# Patient Record
Sex: Female | Born: 1935 | Race: White | Hispanic: No | Smoking: Never smoker
Health system: Southern US, Community
[De-identification: ages and names within clinical notes are randomized; demographics above are authoritative.]

## PROBLEM LIST (undated history)

## (undated) DIAGNOSIS — M978XXA Periprosthetic fracture around other internal prosthetic joint, initial encounter: Secondary | ICD-10-CM

## (undated) DIAGNOSIS — J45909 Unspecified asthma, uncomplicated: Secondary | ICD-10-CM

## (undated) DIAGNOSIS — E785 Hyperlipidemia, unspecified: Secondary | ICD-10-CM

## (undated) DIAGNOSIS — K529 Noninfective gastroenteritis and colitis, unspecified: Secondary | ICD-10-CM

## (undated) DIAGNOSIS — R7303 Prediabetes: Secondary | ICD-10-CM

## (undated) DIAGNOSIS — E119 Type 2 diabetes mellitus without complications: Secondary | ICD-10-CM

## (undated) DIAGNOSIS — R112 Nausea with vomiting, unspecified: Secondary | ICD-10-CM

## (undated) DIAGNOSIS — I214 Non-ST elevation (NSTEMI) myocardial infarction: Secondary | ICD-10-CM

## (undated) DIAGNOSIS — I739 Peripheral vascular disease, unspecified: Secondary | ICD-10-CM

## (undated) DIAGNOSIS — A048 Other specified bacterial intestinal infections: Secondary | ICD-10-CM

## (undated) DIAGNOSIS — I10 Essential (primary) hypertension: Secondary | ICD-10-CM

## (undated) DIAGNOSIS — A0811 Acute gastroenteropathy due to Norwalk agent: Secondary | ICD-10-CM

## (undated) DIAGNOSIS — Z9889 Other specified postprocedural states: Secondary | ICD-10-CM

## (undated) DIAGNOSIS — F329 Major depressive disorder, single episode, unspecified: Secondary | ICD-10-CM

## (undated) DIAGNOSIS — K219 Gastro-esophageal reflux disease without esophagitis: Secondary | ICD-10-CM

## (undated) DIAGNOSIS — Z8489 Family history of other specified conditions: Secondary | ICD-10-CM

## (undated) DIAGNOSIS — T4145XA Adverse effect of unspecified anesthetic, initial encounter: Secondary | ICD-10-CM

## (undated) DIAGNOSIS — I350 Nonrheumatic aortic (valve) stenosis: Secondary | ICD-10-CM

## (undated) DIAGNOSIS — Z96649 Presence of unspecified artificial hip joint: Secondary | ICD-10-CM

## (undated) DIAGNOSIS — T8859XA Other complications of anesthesia, initial encounter: Secondary | ICD-10-CM

## (undated) DIAGNOSIS — I219 Acute myocardial infarction, unspecified: Secondary | ICD-10-CM

## (undated) DIAGNOSIS — H9192 Unspecified hearing loss, left ear: Secondary | ICD-10-CM

## (undated) DIAGNOSIS — I2510 Atherosclerotic heart disease of native coronary artery without angina pectoris: Secondary | ICD-10-CM

## (undated) DIAGNOSIS — F32A Depression, unspecified: Secondary | ICD-10-CM

## (undated) HISTORY — PX: COLONOSCOPY: SHX174

## (undated) HISTORY — PX: OTHER SURGICAL HISTORY: SHX169

## (undated) HISTORY — PX: ABDOMINAL HYSTERECTOMY: SHX81

## (undated) HISTORY — PX: ESOPHAGOGASTRODUODENOSCOPY: SHX1529

## (undated) HISTORY — DX: Nonrheumatic aortic (valve) stenosis: I35.0

## (undated) HISTORY — DX: Acute myocardial infarction, unspecified: I21.9

## (undated) HISTORY — DX: Hyperlipidemia, unspecified: E78.5

## (undated) HISTORY — DX: Noninfective gastroenteritis and colitis, unspecified: K52.9

## (undated) HISTORY — DX: Essential (primary) hypertension: I10

## (undated) HISTORY — DX: Gastro-esophageal reflux disease without esophagitis: K21.9

## (undated) HISTORY — DX: Unspecified asthma, uncomplicated: J45.909

## (undated) HISTORY — DX: Other specified bacterial intestinal infections: A04.8

## (undated) HISTORY — DX: Periprosthetic fracture around other internal prosthetic joint, initial encounter: M97.8XXA

## (undated) HISTORY — DX: Unspecified hearing loss, left ear: H91.92

## (undated) HISTORY — PX: CARDIAC CATHETERIZATION: SHX172

## (undated) HISTORY — PX: CHOLECYSTECTOMY: SHX55

## (undated) HISTORY — DX: Periprosthetic fracture around other internal prosthetic joint, initial encounter: Z96.649

## (undated) HISTORY — DX: Acute gastroenteropathy due to Norwalk agent: A08.11

## (undated) HISTORY — PX: TOTAL HIP ARTHROPLASTY: SHX124

## (undated) HISTORY — PX: BLADDER SURGERY: SHX569

## (undated) HISTORY — DX: Type 2 diabetes mellitus without complications: E11.9

---
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
## 2003-03-09 ENCOUNTER — Other Ambulatory Visit: Payer: Self-pay

## 2003-07-22 ENCOUNTER — Other Ambulatory Visit: Payer: Self-pay

## 2004-09-20 ENCOUNTER — Ambulatory Visit: Payer: Self-pay | Admitting: Specialist

## 2004-10-19 ENCOUNTER — Ambulatory Visit: Payer: Self-pay | Admitting: Specialist

## 2004-10-24 ENCOUNTER — Ambulatory Visit: Payer: Self-pay | Admitting: Specialist

## 2004-11-02 ENCOUNTER — Ambulatory Visit: Payer: Self-pay | Admitting: Specialist

## 2004-11-29 ENCOUNTER — Ambulatory Visit: Payer: Self-pay | Admitting: Specialist

## 2004-12-15 ENCOUNTER — Ambulatory Visit: Payer: Self-pay | Admitting: Gastroenterology

## 2004-12-30 ENCOUNTER — Ambulatory Visit: Payer: Self-pay | Admitting: Specialist

## 2005-01-29 ENCOUNTER — Ambulatory Visit: Payer: Self-pay | Admitting: Specialist

## 2005-02-14 ENCOUNTER — Ambulatory Visit: Payer: Self-pay | Admitting: Gastroenterology

## 2005-11-23 ENCOUNTER — Ambulatory Visit: Payer: Self-pay | Admitting: Specialist

## 2006-06-05 ENCOUNTER — Ambulatory Visit: Payer: Self-pay | Admitting: Unknown Physician Specialty

## 2006-08-13 ENCOUNTER — Ambulatory Visit: Payer: Self-pay | Admitting: Cardiology

## 2006-08-21 ENCOUNTER — Ambulatory Visit: Payer: Self-pay | Admitting: Cardiology

## 2006-10-29 ENCOUNTER — Ambulatory Visit: Payer: Self-pay | Admitting: Specialist

## 2006-11-27 ENCOUNTER — Ambulatory Visit: Payer: Self-pay | Admitting: Specialist

## 2006-12-13 ENCOUNTER — Ambulatory Visit: Payer: Self-pay | Admitting: Rheumatology

## 2007-03-01 ENCOUNTER — Ambulatory Visit: Payer: Self-pay | Admitting: Specialist

## 2007-05-17 ENCOUNTER — Ambulatory Visit: Payer: Self-pay | Admitting: Gastroenterology

## 2007-11-28 ENCOUNTER — Ambulatory Visit: Payer: Self-pay | Admitting: Specialist

## 2008-05-04 ENCOUNTER — Ambulatory Visit: Payer: Self-pay | Admitting: Specialist

## 2008-05-19 ENCOUNTER — Ambulatory Visit: Payer: Self-pay | Admitting: Cardiology

## 2008-05-26 ENCOUNTER — Ambulatory Visit: Payer: Self-pay

## 2008-05-26 ENCOUNTER — Ambulatory Visit: Payer: Self-pay | Admitting: Cardiology

## 2008-05-26 LAB — CONVERTED CEMR LAB
Albumin: 3.3 g/dL — ABNORMAL LOW (ref 3.5–5.2)
Alkaline Phosphatase: 51 units/L (ref 39–117)
BUN: 16 mg/dL (ref 6–23)
Calcium: 9.6 mg/dL (ref 8.4–10.5)
GFR calc Af Amer: 88 mL/min
Glucose, Bld: 156 mg/dL — ABNORMAL HIGH (ref 70–99)
HDL: 57 mg/dL (ref >39.0)
Potassium: 3.9 meq/L (ref 3.5–5.1)
Total Protein: 6.3 g/dL (ref 6.0–8.3)
Triglycerides: 217 mg/dL (ref 0–149)

## 2008-06-16 ENCOUNTER — Ambulatory Visit: Payer: Self-pay

## 2008-11-11 ENCOUNTER — Ambulatory Visit: Payer: Self-pay | Admitting: Family

## 2008-12-01 ENCOUNTER — Ambulatory Visit: Payer: Self-pay | Admitting: Specialist

## 2009-12-29 ENCOUNTER — Ambulatory Visit: Payer: Self-pay | Admitting: Internal Medicine

## 2010-06-09 ENCOUNTER — Ambulatory Visit: Payer: Self-pay | Admitting: Internal Medicine

## 2010-09-13 NOTE — Assessment & Plan Note (Signed)
[facility] OFFICE NOTE   Maureen, Morales                        MRN:          [id number]  DATE:05/19/2008                            DOB:          1922-06-30    HISTORY OF PRESENT ILLNESS:  Maureen Morales comes in today referred by Dr.  Orson Aloe for chest discomfort.   Maureen Morales is an 77 year old lady that I have seen in the past, last  being April 2008.  At that time, she had noncardiac chest pain,  asymptomatic carotid bruits with nonobstructive plaque by carotid  Dopplers and hypertension, which was under good control with Dr.  Sharrell Ku care.  She has hyperlipidemia, has been intolerant of  medications and only takes fish oil and red yeast rice.   Her risk factors for coronary artery disease are outlined in the chart  include age, carotid disease or vascular disease, hypertension, and  hyperlipidemia.   She recently had pneumonia over the holidays.  She has just finished a  course of antibiotics as well as a prednisone taper, which she finished  last week.  She has had some chest discomfort described as some  tightness on occasion, but also sharp at times.  It is not exacerbated  by taking a deep breath.  She denies any difficulty swallowing even  elastic foods such as chicken or bread.  She has had no reflux symptoms  per se.  On occasion, she will take Tums.  She denies any nausea,  vomiting, or change in bowel habits other than some constipation.  She  is actually had a recent colonoscopy within the past year and also  sigmoid recently, which showed just some narrowing.  She was placed on  stool softeners.  She also has hemorrhoids.   Her last objective assessment of potential coronary artery disease was  in April 2005.  She had EF 85% with good perfusion.  Her last carotid  Doppler on August 21, 2006, which showed nonobstructive plaque with  antegrade flow in both vertebrals.   CURRENT  MEDICATIONS:  1. Toprol-XL 25 mg per day.  2. Premarin 0.3 mg p.o. daily.  3. Calcium and vitamin D daily.  4. Multivitamin daily.  5. Biotin one daily.  6. Metformin 500 mg per day.  7. Diovan HCT 320/25 daily.  8. Fish oil 1200 mg 3 per day.  9. Stool softener daily.  10.Tums and Nexium p.r.n..   PHYSICAL EXAMINATION:  VITAL SIGNS:  Her blood pressure today is 142/52,  which is pretty good for her.  Her pulse is 74 and regular.  Her weight  is 130.  HEENT:  Normal.  NECK:  Supple.  Carotid upstrokes were equal bilaterally with soft  bilateral bruits versus referred sounds.  Thyroid is not enlarged.  Trachea is midline.  There is no lymphadenopathy.  LUNGS:  Clear to auscultation and percussion.  CHEST:  No chest wall tenderness to palpation.  She has a nondisplaced  PMI.  Normal S1 and S2.  Soft systolic murmur with her S2  splitting  with inspiration.  ABDOMEN:  Soft, good bowel sounds.  There is no midline bruit or  pulsatile mass.  There is no hepatomegaly.  There is no real tenderness.  EXTREMITIES:  No cyanosis, clubbing, or edema.  Pulses were brisk  bilaterally.  There is no sign of DVT.  NEUROLOGIC:  Grossly intact.  SKIN:  Unremarkable.   Her EKG is normal except for some mild low voltage.   ASSESSMENT:  1. Chest discomfort, rule out any obstructive coronary artery disease.      It all could be related to the residue of pneumonia having taken      antibiotics and prednisone.  It really does not sound like reflux      and it certainly is not reflective of musculoskeletal by exam.  2. Asymptomatic carotid bruits with a history of nonobstructive      carotid plaques.  3. Hypertension, under good control.  4. Hyperlipidemia on omega-3 and red yeast rice, intolerant of other      medications.  5. Recent hemorrhoidal bleed.   PLAN:  1. Continue current medications.  I will not add aspirin at this time.  2. Adenosine rest stress Myoview for obstructive coronary  artery      disease.  3. Carotid Dopplers.  4. Fasting comprehensive metabolic panel, lipid panel per the      patient's request.  I will forward all these results to Dr.      Leavy Cella.     Thomas C. Daleen Squibb, MD, [facility]  Electronically Signed    TCW/MedQ  DD: 05/19/2008  DT: 05/19/2008  Job #: [id number]   cc:   Orson Aloe, MD

## 2010-09-16 NOTE — Assessment & Plan Note (Signed)
[facility] OFFICE NOTE   Maureen Morales, Maureen Morales                        MRN:          [id number]  DATE:08/13/2006                            DOB:          02-28-1923    Maureen Morales comes back today for a follow-up of some chest discomfort.  It  is described as well-localized just above her right breast.  It seems to  be a sharp pain when it occurs.  It sometimes occurs after she has eaten  too much but it seems most of the time it really is not related to much  of anything.   She has been followed by Dr. Leavy Cella by her blood pressure.  Because of  her multiple drug intolerances, she is on omega-3 1000 mg t.i.d. and red  yeast rice.   Her hypertensive medicines are Toprol XL 25 mg a day, Diovan 320 mg a  day.   She had carotid bruits on my last evaluation of her in 2005.  At that  time we obtained carotid Dopplers, which showed nonobstructive plaque  and antegrade flow in both vertebrals.   She is treated intermittently for reflux.   She has no dyspnea on exertion, no angina, no ischemic symptoms  otherwise.  She is extremely active.   PHYSICAL EXAMINATION:  VITAL SIGNS:  Her blood pressure is 138/82, her  pulse was 90 and regular.  GENERAL:  She is having a lot of allergies right now and does not feel  well.  Her EKG is normal.  HEENT:  Normocephalic, atraumatic.  PERRLA, extraocular movements  intact.  Sclerae are clear.  She wears glasses.  Facial symmetry is  normal.  NECK:  Carotids are full with soft systolic sounds bilaterally, right  greater than left.  Thyroid is not enlarged.  Trachea is midline.  LUNGS:  No wheezing or rhonchi.  HEART: A nondisplaced PMI.  She has a soft systolic murmur along the  left sternal border.  S2 splits normally.  ABDOMEN:  Soft with good bowel sounds.  EXTREMITIES:  No cyanosis, clubbing or edema.  She has some varicose  veins.   ASSESSMENT AND PLAN:  1. Noncardiac  chest pain.  She is tender to palpation in that area,      which supports it maybe being musculoskeletal.  She has heavy      breasts, as she pointed out, and this could be causing some strain.      Bra support will help.  2. Asymptomatic carotid bruits.  She has nonobstructive plaque.  3. Hypertension under great control by Dr. Leavy Cella.   PLAN:  1. Continue current medications.  2. Carotid Dopplers at her convenience.  3. See me back in a year.   We did review symptoms of angina and ischemia for her to watch out down  the road.     Thomas C. Daleen Squibb, MD, [facility]  Electronically Signed    TCW/MedQ  DD: 08/13/2006  DT: 08/13/2006  Job #: [id number]   cc:   Dr. Leavy Cella

## 2010-09-23 ENCOUNTER — Encounter: Payer: Self-pay | Admitting: Podiatry

## 2010-11-22 ENCOUNTER — Ambulatory Visit: Payer: Self-pay | Admitting: Internal Medicine

## 2011-01-09 ENCOUNTER — Other Ambulatory Visit (INDEPENDENT_AMBULATORY_CARE_PROVIDER_SITE_OTHER): Payer: Medicare Other | Admitting: *Deleted

## 2011-01-09 ENCOUNTER — Other Ambulatory Visit: Payer: Self-pay | Admitting: *Deleted

## 2011-01-09 DIAGNOSIS — E119 Type 2 diabetes mellitus without complications: Secondary | ICD-10-CM

## 2011-01-09 DIAGNOSIS — Z0000 Encounter for general adult medical examination without abnormal findings: Secondary | ICD-10-CM

## 2011-01-09 MED ORDER — FENOFIBRATE 160 MG PO TABS: 160.0000 mg | ORAL_TABLET | Freq: Every day | ORAL | Status: DC

## 2011-01-09 NOTE — Telephone Encounter (Signed)
Received faxed refill request from pharmacy. Is it okay to refill Fenofibrate?

## 2011-01-10 LAB — HEPATIC FUNCTION PANEL
ALT: 30 U/L (ref 0–35)
AST: 34 U/L (ref 0–37)
Alkaline Phosphatase: 37 U/L — ABNORMAL LOW (ref 39–117)
Bilirubin, Direct: 0.1 mg/dL (ref 0.0–0.3)
Total Protein: 6.9 g/dL (ref 6.0–8.3)

## 2011-01-10 LAB — BASIC METABOLIC PANEL
CO2: 28 mEq/L (ref 19–32)
Chloride: 106 mEq/L (ref 96–112)
Creatinine, Ser: 1.2 mg/dL (ref 0.4–1.2)
Potassium: 4.7 mEq/L (ref 3.5–5.1)

## 2011-01-10 LAB — LIPID PANEL
Total CHOL/HDL Ratio: 2
Triglycerides: 64 mg/dL (ref 0.0–149.0)

## 2011-01-16 ENCOUNTER — Encounter: Payer: Self-pay | Admitting: Internal Medicine

## 2011-01-16 ENCOUNTER — Ambulatory Visit (INDEPENDENT_AMBULATORY_CARE_PROVIDER_SITE_OTHER): Payer: Medicare Other | Admitting: Internal Medicine

## 2011-01-16 VITALS — BP 163/77 | HR 73 | Temp 97.6°F | Resp 16 | Ht 63.0 in | Wt 128.0 lb

## 2011-01-16 DIAGNOSIS — L300 Nummular dermatitis: Secondary | ICD-10-CM | POA: Insufficient documentation

## 2011-01-16 DIAGNOSIS — I10 Essential (primary) hypertension: Secondary | ICD-10-CM

## 2011-01-16 DIAGNOSIS — E119 Type 2 diabetes mellitus without complications: Secondary | ICD-10-CM

## 2011-01-16 DIAGNOSIS — E1169 Type 2 diabetes mellitus with other specified complication: Secondary | ICD-10-CM | POA: Insufficient documentation

## 2011-01-16 DIAGNOSIS — E785 Hyperlipidemia, unspecified: Secondary | ICD-10-CM

## 2011-01-16 DIAGNOSIS — R21 Rash and other nonspecific skin eruption: Secondary | ICD-10-CM

## 2011-01-16 DIAGNOSIS — E1121 Type 2 diabetes mellitus with diabetic nephropathy: Secondary | ICD-10-CM | POA: Insufficient documentation

## 2011-01-16 HISTORY — DX: Nummular dermatitis: L30.0

## 2011-01-16 MED ORDER — NYSTATIN-TRIAMCINOLONE 100000-0.1 UNIT/GM-% EX OINT: TOPICAL_OINTMENT | Freq: Two times a day (BID) | CUTANEOUS | Status: AC

## 2011-01-16 MED ORDER — ESTROGENS CONJUGATED 0.3 MG PO TABS: 0.3000 mg | ORAL_TABLET | Freq: Every day | ORAL | Status: DC

## 2011-01-16 MED ORDER — METOPROLOL SUCCINATE ER 25 MG PO TB24: 25.0000 mg | ORAL_TABLET | Freq: Every day | ORAL | Status: DC

## 2011-01-16 NOTE — Assessment & Plan Note (Signed)
Elevated today.  Will have her recheck it at home and treat if elevated.

## 2011-01-16 NOTE — Patient Instructions (Signed)
Your cholesterol and diabetes control are excellent.  I want you to stop the metformin for now and if the dizzy spells return,  Give me a call.   Your blood pressure is high today.  Please check it a few times over the next two weeks at the pharmacy and call the results to [place] , check pulse as well.   Your rash looks like ringworm so I will prescribe an ointment to be used twice daily until the rash is gone.

## 2011-01-16 NOTE — Assessment & Plan Note (Signed)
Currently at goal with current therapy

## 2011-01-16 NOTE — Progress Notes (Signed)
  Subjective:    Patient ID: Maureen Morales, female    DOB: [birth year], 77 y.o.   MRN: [id number]  HPI 77 yo with well controlled diabetes, hypertension and hyperlipidemia presents for regular followup.  She recently had several episodes of dizziness which occurred during the day.  The episodes were unaccompanied by chest pain, true vertigo, and dyspnea.  The episodes resolved  When she elected to stop her metformin.  She is also reporting a new circular pruritic rash on her chest and under right breast which has been present for a few weeks.  Pruritic, not painful.  Finally she is having  foot surgery by Dr. Adolm Joseph  In the near future for correction of an acquired foot deformity that recently cause a pressure ulcer.  She is supposed to try an orthotic first.   Past Medical History  Diagnosis Date  . Hypertension   . Diabetes mellitus   . Hyperlipidemia    Scheduled Meds:   Continuous Infusions:   PRN Meds:.    Review of Systems  Constitutional: Negative for fever, chills and unexpected weight change.  HENT: Negative for hearing loss, ear pain, nosebleeds, congestion, sore throat, facial swelling, rhinorrhea, sneezing, mouth sores, trouble swallowing, neck pain, neck stiffness, voice change, postnasal drip, sinus pressure, tinnitus and ear discharge.   Eyes: Negative for pain, discharge, redness and visual disturbance.  Respiratory: Negative for cough, chest tightness, shortness of breath, wheezing and stridor.   Cardiovascular: Negative for chest pain, palpitations and leg swelling.  Musculoskeletal: Negative for myalgias and arthralgias.  Skin: Negative for color change and rash.  Neurological: Negative for dizziness, weakness, light-headedness and headaches.  Hematological: Negative for adenopathy.       Objective:   Physical Exam  Constitutional: She is oriented to person, place, and time. She appears well-developed and well-nourished.  HENT:  Mouth/Throat: Oropharynx is clear and  moist.  Eyes: EOM are normal. Pupils are equal, round, and reactive to light. No scleral icterus.  Neck: Normal range of motion. Neck supple. No JVD present. No thyromegaly present.  Cardiovascular: Normal rate, regular rhythm, normal heart sounds and intact distal pulses.   Pulmonary/Chest: Effort normal and breath sounds normal.  Abdominal: Soft. Bowel sounds are normal. She exhibits no mass. There is no tenderness.  Musculoskeletal: Normal range of motion. She exhibits no edema.  Lymphadenopathy:    She has no cervical adenopathy.  Neurological: She is alert and oriented to person, place, and time.  Skin: Skin is warm and dry.  Psychiatric: She has a normal mood and affect.          Assessment & Plan:

## 2011-01-16 NOTE — Assessment & Plan Note (Signed)
Will treat with nystatin empirically.

## 2011-01-16 NOTE — Assessment & Plan Note (Signed)
Very well controlled, with recent history of hypoglycemia supported by hgba1c of 6.1 .  Agree with stopping metformin.

## 2011-01-31 ENCOUNTER — Ambulatory Visit: Payer: Self-pay | Admitting: Internal Medicine

## 2011-02-06 ENCOUNTER — Encounter: Payer: Self-pay | Admitting: Internal Medicine

## 2011-02-10 ENCOUNTER — Telehealth: Payer: Self-pay | Admitting: *Deleted

## 2011-02-10 ENCOUNTER — Other Ambulatory Visit (INDEPENDENT_AMBULATORY_CARE_PROVIDER_SITE_OTHER): Payer: Medicare Other | Admitting: *Deleted

## 2011-02-10 DIAGNOSIS — N390 Urinary tract infection, site not specified: Secondary | ICD-10-CM

## 2011-02-10 LAB — POCT URINALYSIS DIPSTICK
Bilirubin, UA: NEGATIVE
Glucose, UA: NEGATIVE
Ketones, UA: NEGATIVE
Leukocytes, UA: NEGATIVE
Nitrite, UA: NEGATIVE
pH, UA: 5

## 2011-02-10 NOTE — Telephone Encounter (Signed)
Patient came in today complaining of having pain in vaginal area. Wanted to give urine sample. Urine results have been entered. I called patient and advised her to schedule appt since UA was neg.

## 2011-02-13 ENCOUNTER — Ambulatory Visit (INDEPENDENT_AMBULATORY_CARE_PROVIDER_SITE_OTHER): Payer: Medicare Other | Admitting: Internal Medicine

## 2011-02-13 ENCOUNTER — Encounter: Payer: Self-pay | Admitting: Internal Medicine

## 2011-02-13 VITALS — BP 118/64 | HR 72 | Temp 98.6°F | Ht 61.75 in | Wt 128.5 lb

## 2011-02-13 DIAGNOSIS — R102 Pelvic and perineal pain: Secondary | ICD-10-CM

## 2011-02-13 DIAGNOSIS — N949 Unspecified condition associated with female genital organs and menstrual cycle: Secondary | ICD-10-CM

## 2011-02-13 LAB — URINALYSIS
Ketones, ur: NEGATIVE mg/dL
Nitrite: NEGATIVE
Specific Gravity, Urine: 1.013 (ref 1.005–1.030)
pH: 7.5 (ref 5.0–8.0)

## 2011-02-13 NOTE — Assessment & Plan Note (Signed)
History and exam supports inguinal muscle strain involving prior surgical sites in setting of recent liftng of heavy objects associated with home acitivites. Advised her to refrain from lifiting more than 5 lbs gicden recent bladder surgery.

## 2011-02-13 NOTE — Progress Notes (Signed)
Subjective:    Patient ID: Maureen Morales, female    DOB: 02-08-1923, 77 y.o.   MRN: [id number]  HPI  77 yo white female with a history of recent pelvic surgery  for bladder prolapse presents with lower pelvic pain since Thursday.  crampy in nature,  No evidence of UTI by recent urinalysis   Stools are formed with occasional constipation but takes a stool softener every night.  No blood .  Picked up something very heavy last week before onset of pain .   Past Medical History  Diagnosis Date  . Hypertension   . Diabetes mellitus   . Hyperlipidemia    Current Outpatient Prescriptions on File Prior to Visit  Medication Sig Dispense Refill  . BIOTIN 5000 PO Take by mouth.        . calcium carbonate (TUMS) 500 MG chewable tablet Chew 1 tablet by mouth. Taken when needed      . cholecalciferol (VITAMIN D) 1000 UNITS tablet Take 1,000 Units by mouth daily.        [name] estrogens, conjugated, (PREMARIN) 0.3 MG tablet Take 1 tablet (0.3 mg total) by mouth daily.  30 tablet  3  . fenofibrate 160 MG tablet Take 1 tablet (160 mg total) by mouth daily. For high triglycerides  30 tablet  5  . metFORMIN (GLUCOPHAGE-XR) 750 MG 24 hr tablet Take 750 mg by mouth daily with breakfast.        . metoprolol succinate (TOPROL-XL) 25 MG 24 hr tablet Take 1 tablet (25 mg total) by mouth daily.  30 tablet  3  . Multiple Vitamins-Minerals (ABC PLUS SENIOR PO) Take by mouth.        . Omega-3 Fatty Acids (FISH OIL) 1200 MG CAPS Take by mouth.        . valsartan-hydrochlorothiazide (DIOVAN-HCT) 320-25 MG per tablet Take 1 tablet by mouth.        . nystatin-triamcinolone (MYCOLOG) ointment Apply topically 2 (two) times daily.  30 g  2    Review of Systems  Constitutional: Negative for fever, chills and unexpected weight change.  HENT: Negative for hearing loss, ear pain, nosebleeds, congestion, sore throat, facial swelling, rhinorrhea, sneezing, mouth sores, trouble swallowing, neck pain, neck stiffness, voice change,  postnasal drip, sinus pressure, tinnitus and ear discharge.   Eyes: Negative for pain, discharge, redness and visual disturbance.  Respiratory: Negative for cough, chest tightness, shortness of breath, wheezing and stridor.   Cardiovascular: Negative for chest pain, palpitations and leg swelling.  Musculoskeletal: Negative for myalgias and arthralgias.  Skin: Negative for color change and rash.  Neurological: Negative for dizziness, weakness, light-headedness and headaches.  Hematological: Negative for adenopathy.       Objective:   Physical Exam  Constitutional: She is oriented to person, place, and time. She appears well-developed and well-nourished.  HENT:  Mouth/Throat: Oropharynx is clear and moist.  Eyes: EOM are normal. Pupils are equal, round, and reactive to light. No scleral icterus.  Neck: Normal range of motion. Neck supple. No JVD present. No thyromegaly present.  Cardiovascular: Normal rate, regular rhythm, normal heart sounds and intact distal pulses.   Pulmonary/Chest: Effort normal and breath sounds normal.  Abdominal: Soft. Bowel sounds are normal. She exhibits no mass. There is no tenderness.  Musculoskeletal: Normal range of motion. She exhibits no edema.  Lymphadenopathy:    She has no cervical adenopathy.  Neurological: She is alert and oriented to person, place, and time.  Skin: Skin is warm and dry.  Psychiatric: She has a normal mood and affect.          Assessment & Plan:

## 2011-02-13 NOTE — Patient Instructions (Signed)
You should not lift more than 5 lbs from a stooped position because it is weakening your surgery on your bladder and vagina.

## 2011-02-15 ENCOUNTER — Ambulatory Visit (INDEPENDENT_AMBULATORY_CARE_PROVIDER_SITE_OTHER): Payer: Medicare Other | Admitting: Internal Medicine

## 2011-02-15 DIAGNOSIS — R319 Hematuria, unspecified: Secondary | ICD-10-CM

## 2011-02-15 LAB — POCT URINALYSIS DIPSTICK
Bilirubin, UA: NEGATIVE
Nitrite, UA: NEGATIVE
Protein, UA: NEGATIVE
Urobilinogen, UA: 0.2
pH, UA: 6

## 2011-02-15 MED ORDER — HYOSCYAMINE SULFATE 0.125 MG SL SUBL: 0.1250 mg | SUBLINGUAL_TABLET | SUBLINGUAL | Status: AC | PRN

## 2011-02-15 MED ORDER — CIPROFLOXACIN HCL 250 MG PO TABS: 250.0000 mg | ORAL_TABLET | Freq: Two times a day (BID) | ORAL | Status: AC

## 2011-02-15 NOTE — Patient Instructions (Signed)
I have called in ciprofloxacin for your urinary symptoms,  It is an antibiotic.,  Take it twice daily for one  Week.  If your urine culture ends up negative (normal),  We will have to image the bladder.

## 2011-02-15 NOTE — Progress Notes (Signed)
Subjective:    Patient ID: Maureen Morales, female    DOB: 1923-04-15, 77 y.o.   MRN: [id number]  HPI  77 yo white female with history of hysterectomy , bladder suspenion and vaginoplasty done in 2011 at [facility], , on vaginal estrogen,  returns for recurrent pelvic pain now accompanied by spotting.    She denies blood in stool, recent sexual activity, and fevers,  She has reported episodes of vaginal bleeding in the past and has had normal pelic exam by her urogyneocologist as well as abdominal/pelvic CTs which were unrevealing.   Past Medical History  Diagnosis Date  . Hypertension   . Diabetes mellitus   . Hyperlipidemia     Current Outpatient Prescriptions on File Prior to Visit  Medication Sig Dispense Refill  . acetaminophen (TYLENOL) 500 MG tablet Take 500 mg by mouth as needed.        [name] BIOTIN 5000 PO Take by mouth.        . calcium carbonate (TUMS) 500 MG chewable tablet Chew 1 tablet by mouth. Taken when needed      . cholecalciferol (VITAMIN D) 1000 UNITS tablet Take 1,000 Units by mouth daily.        [name] estrogens, conjugated, (PREMARIN) 0.3 MG tablet Take 1 tablet (0.3 mg total) by mouth daily.  30 tablet  3  . fenofibrate 160 MG tablet Take 1 tablet (160 mg total) by mouth daily. For high triglycerides  30 tablet  5  . ibuprofen (ADVIL,MOTRIN) 200 MG tablet Take 200 mg by mouth as needed.        . loratadine (CLARITIN) 10 MG tablet Take 10 mg by mouth daily as needed.        . metFORMIN (GLUCOPHAGE-XR) 750 MG 24 hr tablet Take 750 mg by mouth daily with breakfast.        . metoprolol succinate (TOPROL-XL) 25 MG 24 hr tablet Take 1 tablet (25 mg total) by mouth daily.  30 tablet  3  . Multiple Vitamins-Minerals (ABC PLUS SENIOR PO) Take by mouth.        . nystatin-triamcinolone (MYCOLOG) ointment Apply topically 2 (two) times daily.  30 g  2  . Omega-3 Fatty Acids (FISH OIL) 1200 MG CAPS Take by mouth.        . valsartan-hydrochlorothiazide (DIOVAN-HCT) 320-25 MG per tablet Take 1  tablet by mouth.           Review of Systems  Constitutional: Negative for fever, chills and unexpected weight change.  HENT: Negative for hearing loss, ear pain, nosebleeds, congestion, sore throat, facial swelling, rhinorrhea, sneezing, mouth sores, trouble swallowing, neck pain, neck stiffness, voice change, postnasal drip, sinus pressure, tinnitus and ear discharge.   Eyes: Negative for pain, discharge, redness and visual disturbance.  Respiratory: Negative for cough, chest tightness, shortness of breath, wheezing and stridor.   Cardiovascular: Negative for chest pain, palpitations and leg swelling.  Genitourinary: Positive for pelvic pain.  Musculoskeletal: Negative for myalgias and arthralgias.  Skin: Negative for color change and rash.  Neurological: Negative for dizziness, weakness, light-headedness and headaches.  Hematological: Negative for adenopathy.   BP 138/66  Pulse 77  Temp(Src) 97.9 F (36.6 C) (Oral)  SpO2 98%     Objective:   Physical Exam  Constitutional: She appears well-developed and well-nourished.  Eyes: No scleral icterus.  Neck: Neck supple. No JVD present. No thyromegaly present.  Cardiovascular: Normal rate, regular rhythm, normal heart sounds and intact distal pulses.   Pulmonary/Chest: Effort  normal and breath sounds normal.  Abdominal: Soft. Bowel sounds are normal. She exhibits no mass. There is no tenderness.  Genitourinary: Vagina normal. Guaiac negative stool. No vaginal discharge found.  Musculoskeletal: Normal range of motion. She exhibits no edema.  Lymphadenopathy:    She has no cervical adenopathy.  Psychiatric: She has a normal mood and affect.          Assessment & Plan:  Vaginal bleeding:  I found no evidence of vaginal bleeding on exam.  The stains on her underwear  May have been caused by hematuria given her abnormal UA.  Will treat empirically for UTI and if culture is negative she will need a urologic evaluation with cystoscopy  to rule out urologic CA

## 2011-02-17 LAB — CULTURE, URINE COMPREHENSIVE: Organism ID, Bacteria: NO GROWTH

## 2011-02-21 ENCOUNTER — Telehealth: Payer: Self-pay | Admitting: Internal Medicine

## 2011-02-21 NOTE — Telephone Encounter (Signed)
Patient unable to be seen by the urologist Dr. Achilles Dunk he will not see any new patients until January . He daughter wants her to be seen by someone as soon as possible even if the Dr. is in [place]. The daughter wants someone you would prefer.

## 2011-02-22 NOTE — Telephone Encounter (Signed)
Maureen Morales,  Can you also request the records from [facility]?  She has had prior urogyn surgery in the last 2 years and I do not have those records because [facility] did not forward them.  Dr. Orson Slick will need them

## 2011-02-22 NOTE — Telephone Encounter (Signed)
I prefer Dr. Orson Slick here in [place]

## 2011-03-02 DIAGNOSIS — A0811 Acute gastroenteropathy due to Norwalk agent: Secondary | ICD-10-CM | POA: Insufficient documentation

## 2011-03-02 HISTORY — DX: Acute gastroenteropathy due to Norwalk agent: A08.11

## 2011-03-26 ENCOUNTER — Emergency Department: Payer: Self-pay | Admitting: Internal Medicine

## 2011-03-27 ENCOUNTER — Telehealth: Payer: Self-pay | Admitting: Internal Medicine

## 2011-03-27 NOTE — Telephone Encounter (Signed)
Patient fell coming out of church on Sunday went to the ER she did have to get stitches in the back of her head, and cracked her tail bone.  Daughter states that when patient lays down there is a little blood from her head.  She states that when she took her to the ER they told her to contact her PCP.  She wasn't sure if she would need follow up or not. Please advise.

## 2011-03-27 NOTE — Telephone Encounter (Signed)
I cannot determine if patient needs to be seen by the information obtained.  This needs to be triaged.  The appropriate questions are:Maureen Morales   Does she want to be seen make her an ppt if so .  If she is leaving it up to me, ask her this:   Does she have a place on her scalp or in her nose that is bleeding?   Did the CT scan her head.   Does she have a headache

## 2011-03-27 NOTE — Telephone Encounter (Signed)
I tried to get in touch with patients daughter but could not reach her, I will try again in the morning.

## 2011-03-28 ENCOUNTER — Telehealth: Payer: Self-pay | Admitting: Internal Medicine

## 2011-03-28 ENCOUNTER — Inpatient Hospital Stay: Payer: Self-pay | Admitting: Internal Medicine

## 2011-03-28 NOTE — Telephone Encounter (Signed)
Patient is in the hospital

## 2011-03-28 NOTE — Telephone Encounter (Signed)
Dr Renard Hamper @ [facility] wanted to talk to you about Mirely Pangle.  His phone number is [phone]

## 2011-03-29 DIAGNOSIS — R55 Syncope and collapse: Secondary | ICD-10-CM

## 2011-03-29 DIAGNOSIS — A088 Other specified intestinal infections: Secondary | ICD-10-CM

## 2011-03-29 DIAGNOSIS — I951 Orthostatic hypotension: Secondary | ICD-10-CM

## 2011-03-30 ENCOUNTER — Encounter: Payer: Self-pay | Admitting: Internal Medicine

## 2011-03-30 ENCOUNTER — Telehealth: Payer: Self-pay | Admitting: Internal Medicine

## 2011-03-30 NOTE — Telephone Encounter (Signed)
error 

## 2011-03-31 ENCOUNTER — Other Ambulatory Visit: Payer: Self-pay | Admitting: Internal Medicine

## 2011-03-31 MED ORDER — HYDROCORTISONE ACE-PRAMOXINE 1-1 % RE CREA: TOPICAL_CREAM | Freq: Two times a day (BID) | RECTAL | Status: AC

## 2011-04-03 ENCOUNTER — Encounter: Payer: Self-pay | Admitting: Internal Medicine

## 2011-04-03 ENCOUNTER — Ambulatory Visit (INDEPENDENT_AMBULATORY_CARE_PROVIDER_SITE_OTHER): Payer: Medicare Other | Admitting: Internal Medicine

## 2011-04-03 DIAGNOSIS — S0101XA Laceration without foreign body of scalp, initial encounter: Secondary | ICD-10-CM

## 2011-04-03 DIAGNOSIS — A0811 Acute gastroenteropathy due to Norwalk agent: Secondary | ICD-10-CM

## 2011-04-03 DIAGNOSIS — K648 Other hemorrhoids: Secondary | ICD-10-CM

## 2011-04-03 DIAGNOSIS — S0100XA Unspecified open wound of scalp, initial encounter: Secondary | ICD-10-CM

## 2011-04-03 MED ORDER — HYDROCORTISONE ACETATE 25 MG RE SUPP: 25.0000 mg | Freq: Two times a day (BID) | RECTAL | Status: DC

## 2011-04-03 NOTE — Patient Instructions (Signed)

## 2011-04-05 ENCOUNTER — Ambulatory Visit: Payer: Medicare Other | Admitting: Internal Medicine

## 2011-04-05 DIAGNOSIS — S0101XA Laceration without foreign body of scalp, initial encounter: Secondary | ICD-10-CM | POA: Insufficient documentation

## 2011-04-05 DIAGNOSIS — K648 Other hemorrhoids: Secondary | ICD-10-CM | POA: Insufficient documentation

## 2011-04-05 NOTE — Assessment & Plan Note (Signed)
Aggravated by frequent stooling.  incrase fiber with daily miralax,  anusol hc changed to suppository form,  If no improvement in one week refer to Gen Surg or Lynnae Prude er patient preoference.

## 2011-04-05 NOTE — Assessment & Plan Note (Signed)
She will return in a few days for removal of staples, but currently her hair is so matted over the staple that II suggested soaking it in conditioner.

## 2011-04-05 NOTE — Assessment & Plan Note (Signed)
Presumed, since cultures were negative.  Resolved with time and prudent diet.  Rdcommendations for prudent diet again reviewed.

## 2011-04-05 NOTE — Progress Notes (Signed)
Subjective:    Patient ID: Maureen Morales, female    DOB: 08/01/1922, 77 y.o.   MRN: [id number]  HPI   Maureen Morales returns for hospital followup after admission for syncope and ortostasis resulting from dehydration and diarrhea attributed to a viral gastroenteritis. 2 days prior to admission she had fallen and suffered a nondisplaced pelvic/coccyx bone fracture and a lacerated scalp per ER evaluation, with stitches placed in ED .  She was discharged home from [facility] after  2 days of IV fluids, PT, and stool cultures , which were  fegative for bacterial pathogens, and prewscribed hemorrhoid cream for irritated external hemorrhoid.  She presents urgently today because of rectal pain associated with internal hemorrhoid irritatino.  [name] has been stooling small amounts of formed stools, has been eating a varied diet rather than bland as suggested.  No abdominal pain, nausea, diarrhea, or syncope.  Denies significant pain.   Metformin was held at discharge.  Past Medical History  Diagnosis Date  . Hypertension   . Diabetes mellitus   . Hyperlipidemia   . Viral gastroenteritis due to Norwalk-like agent Nov 2012   Current Outpatient Prescriptions on File Prior to Visit  Medication Sig Dispense Refill  . acetaminophen (TYLENOL) 500 MG tablet Take 500 mg by mouth as needed.        [name] BIOTIN 5000 PO Take by mouth.        . calcium carbonate (TUMS) 500 MG chewable tablet Chew 1 tablet by mouth. Taken when needed      . cholecalciferol (VITAMIN D) 1000 UNITS tablet Take 1,000 Units by mouth daily.        [name] estrogens, conjugated, (PREMARIN) 0.3 MG tablet Take 1 tablet (0.3 mg total) by mouth daily.  30 tablet  3  . fenofibrate 160 MG tablet Take 1 tablet (160 mg total) by mouth daily. For high triglycerides  30 tablet  5  . ibuprofen (ADVIL,MOTRIN) 200 MG tablet Take 200 mg by mouth as needed.        . loratadine (CLARITIN) 10 MG tablet Take 10 mg by mouth daily as needed.        . metFORMIN (GLUCOPHAGE-XR) 750  MG 24 hr tablet Take 750 mg by mouth daily with breakfast.        . metoprolol succinate (TOPROL-XL) 25 MG 24 hr tablet Take 1 tablet (25 mg total) by mouth daily.  30 tablet  3  . Multiple Vitamins-Minerals (ABC PLUS SENIOR PO) Take by mouth.        . nystatin-triamcinolone (MYCOLOG) ointment Apply topically 2 (two) times daily.  30 g  2  . Omega-3 Fatty Acids (FISH OIL) 1200 MG CAPS Take by mouth.        . pramoxine-hydrocortisone (ANALPRAM-HC) 1-1 % rectal cream Place rectally 2 (two) times daily.  30 g  1  . valsartan-hydrochlorothiazide (DIOVAN-HCT) 320-25 MG per tablet Take 1 tablet by mouth.           Review of Systems  Constitutional: Negative for fever, chills and unexpected weight change.  HENT: Negative for hearing loss, ear pain, nosebleeds, congestion, sore throat, facial swelling, rhinorrhea, sneezing, mouth sores, trouble swallowing, neck pain, neck stiffness, voice change, postnasal drip, sinus pressure, tinnitus and ear discharge.   Eyes: Negative for pain, discharge, redness and visual disturbance.  Respiratory: Negative for cough, chest tightness, shortness of breath, wheezing and stridor.   Cardiovascular: Negative for chest pain, palpitations and leg swelling.  Musculoskeletal: Negative for myalgias and arthralgias.  Skin: Negative for color change and rash.  Neurological: Negative for dizziness, weakness, light-headedness and headaches.  Hematological: Negative for adenopathy.       Objective:   Physical Exam  Constitutional: She is oriented to person, place, and time. She appears well-developed and well-nourished.  HENT:  Mouth/Throat: Oropharynx is clear and moist.  Eyes: EOM are normal. Pupils are equal, round, and reactive to light. No scleral icterus.  Neck: Normal range of motion. Neck supple. No JVD present. No thyromegaly present.  Cardiovascular: Normal rate, regular rhythm, normal heart sounds and intact distal pulses.   Pulmonary/Chest: Effort normal and  breath sounds normal.  Abdominal: Soft. Bowel sounds are normal. She exhibits no mass. There is no tenderness.  Genitourinary:     Musculoskeletal: Normal range of motion. She exhibits no edema.  Lymphadenopathy:    She has no cervical adenopathy.  Neurological: She is alert and oriented to person, place, and time.  Skin: Skin is warm and dry.  Psychiatric: She has a normal mood and affect.          Assessment & Plan:

## 2011-04-12 ENCOUNTER — Telehealth: Payer: Self-pay | Admitting: Internal Medicine

## 2011-04-12 NOTE — Telephone Encounter (Signed)
Maureen Morales stated she can't bring her by today she stated she will bring her by tomorrow.  I advised her that you are not here in the afternoon so it needed to be in the morning.

## 2011-04-12 NOTE — Telephone Encounter (Signed)
Patients daughter called and stated Dr. Jenne Campus took her stiches out that were in her head a week ago.  Maureen Morales stated her mother was getting her hair done today and the beautician noticed it looked infected.  She stated around the area it is still bleeding a little bit and it is red, almost black looking.  She wanted to know if she should be seen by you or let Dr. Jenne Campus see her.  Please advise.

## 2011-04-12 NOTE — Telephone Encounter (Signed)
I will take a quick look at it today between patients.

## 2011-04-13 ENCOUNTER — Telehealth: Payer: Self-pay | Admitting: Internal Medicine

## 2011-04-14 NOTE — Telephone Encounter (Signed)
Patient notified.  Lab appt scheduled.

## 2011-04-14 NOTE — Telephone Encounter (Signed)
Patient wants to know if she can have labs done prior to her appt on the 19th. If so what labs would you like for me to order and I will call and schedule her an appt.

## 2011-04-14 NOTE — Telephone Encounter (Signed)
Hgba1c, cmet, and fasting lipids.  thanks

## 2011-04-17 ENCOUNTER — Other Ambulatory Visit (INDEPENDENT_AMBULATORY_CARE_PROVIDER_SITE_OTHER): Payer: Medicare Other | Admitting: *Deleted

## 2011-04-17 ENCOUNTER — Other Ambulatory Visit: Payer: Self-pay | Admitting: Internal Medicine

## 2011-04-17 DIAGNOSIS — L8995 Pressure ulcer of unspecified site, unstageable: Secondary | ICD-10-CM

## 2011-04-17 DIAGNOSIS — E119 Type 2 diabetes mellitus without complications: Secondary | ICD-10-CM

## 2011-04-17 DIAGNOSIS — I10 Essential (primary) hypertension: Secondary | ICD-10-CM

## 2011-04-17 DIAGNOSIS — E785 Hyperlipidemia, unspecified: Secondary | ICD-10-CM

## 2011-04-17 LAB — COMPREHENSIVE METABOLIC PANEL
ALT: 24 U/L (ref 0–35)
AST: 24 U/L (ref 0–37)
Alkaline Phosphatase: 81 U/L (ref 39–117)
Creatinine, Ser: 1 mg/dL (ref 0.4–1.2)
Total Bilirubin: 0.5 mg/dL (ref 0.3–1.2)

## 2011-04-17 LAB — HEMOGLOBIN A1C: Hgb A1c MFr Bld: 6.4 % (ref 4.6–6.5)

## 2011-04-17 LAB — LIPID PANEL
HDL: 54.9 mg/dL (ref >39.00)
Total CHOL/HDL Ratio: 2
VLDL: 21.4 mg/dL (ref 0.0–40.0)

## 2011-04-19 ENCOUNTER — Encounter: Payer: Self-pay | Admitting: Internal Medicine

## 2011-04-19 ENCOUNTER — Ambulatory Visit (INDEPENDENT_AMBULATORY_CARE_PROVIDER_SITE_OTHER): Payer: Medicare Other | Admitting: Internal Medicine

## 2011-04-19 DIAGNOSIS — E785 Hyperlipidemia, unspecified: Secondary | ICD-10-CM

## 2011-04-19 DIAGNOSIS — E119 Type 2 diabetes mellitus without complications: Secondary | ICD-10-CM

## 2011-04-19 DIAGNOSIS — R42 Dizziness and giddiness: Secondary | ICD-10-CM

## 2011-04-19 DIAGNOSIS — M65839 Other synovitis and tenosynovitis, unspecified forearm: Secondary | ICD-10-CM

## 2011-04-19 DIAGNOSIS — M778 Other enthesopathies, not elsewhere classified: Secondary | ICD-10-CM

## 2011-04-19 MED ORDER — PREDNISONE (PAK) 10 MG PO TABS: ORAL_TABLET | ORAL | Status: DC

## 2011-04-19 MED ORDER — PREDNISONE (PAK) 10 MG PO TABS: ORAL_TABLET | ORAL | Status: AC

## 2011-04-19 NOTE — Assessment & Plan Note (Signed)
Well controlled 

## 2011-04-19 NOTE — Progress Notes (Signed)
  Subjective:    Patient ID: Maureen Morales, female    DOB: [birth year], 77 y.o.   MRN: [id number]  HPI cc vertigo with sudden posaition changes.      Review of Systems     Objective:   Physical Exam        Assessment & Plan:  Vertifo:  Sh eis not orthostatic bp 140/70 pulse 65 no change with stnading.,   Subjective:     Maureen Morales is a 77 y.o. female who presents for evaluation of dizziness. The symptoms started 4 weeks ago and are ongoing. The attacks occur frequently and last a few minutes. Positions that worsen symptoms: rolling in bed to the left, rolling in bed to the right and standing up. Previous workup/treatments: CT scan of head. Associated ear symptoms: aural pressure. Associated CNS symptoms: none. Recent infections: upper respiratory infection. Head trauma: denied. Drug ingestion: none. Noise exposure: no occupational exposure. Family history: non-contributory.  The following portions of the patient's history were reviewed and updated as appropriate: allergies, current medications, past family history, past medical history, past social history, past surgical history and problem list.  Review of Systems A comprehensive review of systems was negative.    Objective:    BP 132/64  Pulse 68  Temp(Src) 97.8 F (36.6 C) (Oral)  Ht 5\' 1"  (1.549 m)  Wt 121 lb 8 oz (55.112 kg)  BMI 22.96 kg/m2  SpO2 100% Eyes: conjunctivae/corneas clear. PERRL, EOM's intact. Fundi benign. Ears: normal TM's and external ear canals both ears Throat: lips, mucosa, and tongue normal; teeth and gums normal Neck: no adenopathy, no carotid bruit, no JVD, supple, symmetrical, trachea midline and thyroid not enlarged, symmetric, no tenderness/mass/nodules Heart: regular rate and rhythm, S1, S2 normal, no murmur, click, rub or gallop      Assessment:    Acute labyrinthitis    Plan:    Steroids per medication orders. OTC decongestants. The nature of vertigo syndromes were discussed along  with their usual course and treatment.

## 2011-04-19 NOTE — Patient Instructions (Signed)
Your symptoms are coming from an inner ear problem.   I want to resume claritin and add Sudafed PE 10 mg every 6 hours as a decongestant  Add the prednisone in a 6 day taper to deal with any inflammation if there is no difference after 2 or 3 days.    The prednisone will elevate your sugars for a few days so you can resume your metformin for a few weeks.  [name]

## 2011-05-05 ENCOUNTER — Encounter: Payer: Self-pay | Admitting: Internal Medicine

## 2011-05-05 ENCOUNTER — Telehealth: Payer: Self-pay | Admitting: Internal Medicine

## 2011-05-05 ENCOUNTER — Ambulatory Visit (INDEPENDENT_AMBULATORY_CARE_PROVIDER_SITE_OTHER): Payer: Medicare Other | Admitting: Internal Medicine

## 2011-05-05 ENCOUNTER — Other Ambulatory Visit: Payer: Self-pay | Admitting: *Deleted

## 2011-05-05 VITALS — BP 128/66 | HR 84 | Temp 98.4°F | Wt 124.0 lb

## 2011-05-05 DIAGNOSIS — J40 Bronchitis, not specified as acute or chronic: Secondary | ICD-10-CM

## 2011-05-05 DIAGNOSIS — S0100XA Unspecified open wound of scalp, initial encounter: Secondary | ICD-10-CM | POA: Diagnosis not present

## 2011-05-05 DIAGNOSIS — R6889 Other general symptoms and signs: Secondary | ICD-10-CM

## 2011-05-05 DIAGNOSIS — S0101XA Laceration without foreign body of scalp, initial encounter: Secondary | ICD-10-CM

## 2011-05-05 LAB — POCT INFLUENZA A/B: Influenza A, POC: NEGATIVE

## 2011-05-05 MED ORDER — AZITHROMYCIN 500 MG PO TABS: 500.0000 mg | ORAL_TABLET | Freq: Every day | ORAL | Status: DC

## 2011-05-05 MED ORDER — OSELTAMIVIR PHOSPHATE 75 MG PO CAPS: 75.0000 mg | ORAL_CAPSULE | Freq: Every day | ORAL | Status: AC

## 2011-05-05 MED ORDER — AZITHROMYCIN 500 MG PO TABS: 500.0000 mg | ORAL_TABLET | Freq: Every day | ORAL | Status: AC

## 2011-05-05 NOTE — Patient Instructions (Signed)
Sudafed or Sudafed PE is for congestion  Mucinex is for thick mucus, to help you cough it up.   D stands  for sudafed  DM stands for dextromethorphan , a cough suppressant (try Delsym)   Add the antibiotic if you develop fever,  Productive, cough or facial pain

## 2011-05-05 NOTE — Telephone Encounter (Signed)
Spoke with patient and instructed her on taking tamiflu and to start zithromax in a couple of days if she isn't any better.

## 2011-05-05 NOTE — Telephone Encounter (Signed)
Please call Maureen Morales and let her known that Maureen Morales was seen today, her housekeeper,  And she tested positive for the flu, wo I am putting Maureen Morales on once daily tamiflu as a precaution. It has been sent to her pharmacy

## 2011-05-05 NOTE — Progress Notes (Signed)
Subjective:    Patient ID: Maureen Morales, female    DOB: 1922-05-10, 78 y.o.   MRN: [id number]  HPI  78 yo woman  recently hospitalized with dehydration and viral enterits complicated a fall with scalp laceration presents with 2 complaints  Her scalp wound is still worrying her to death bc her hair dresser continues to call attention to it.  It is not bleeding.  It is not painful.  The scalp was never shaved by ER physician when she was treated after her fall, and the eschar has reduced in in size but is still present and getting pulled on by her hair.  Her 2nd complaint is sudden onset of cough, myalgias, chest tightness, and runny nose after a sick contact a few days ago with her house cleaner.  It is aggravated by patient cleaning out her fireplace of ashes,  Which she does when her cleaning woman is not present.  She typically neve wears a mask when she does this task.  Past Medical History  Diagnosis Date  . Hypertension   . Diabetes mellitus   . Hyperlipidemia   . Viral gastroenteritis due to Norwalk-like agent Nov 2012   Current Outpatient Prescriptions on File Prior to Visit  Medication Sig Dispense Refill  . acetaminophen (TYLENOL) 500 MG tablet Take 500 mg by mouth as needed.        [name] BIOTIN 5000 PO Take by mouth.        . calcium carbonate (TUMS) 500 MG chewable tablet Chew 1 tablet by mouth. Taken when needed      . cholecalciferol (VITAMIN D) 1000 UNITS tablet Take 1,000 Units by mouth daily.        [name] estrogens, conjugated, (PREMARIN) 0.3 MG tablet Take 1 tablet (0.3 mg total) by mouth daily.  30 tablet  3  . fenofibrate 160 MG tablet Take 1 tablet (160 mg total) by mouth daily. For high triglycerides  30 tablet  5  . hydrocortisone (ANUSOL-HC) 25 MG suppository Place 1 suppository (25 mg total) rectally every 12 (twelve) hours.  12 suppository  1  . ibuprofen (ADVIL,MOTRIN) 200 MG tablet Take 200 mg by mouth as needed.        . loratadine (CLARITIN) 10 MG tablet Take 10 mg by  mouth daily as needed.        . metFORMIN (GLUCOPHAGE-XR) 750 MG 24 hr tablet Take 750 mg by mouth daily with breakfast.        . metoprolol succinate (TOPROL-XL) 25 MG 24 hr tablet Take 1 tablet (25 mg total) by mouth daily.  30 tablet  3  . Multiple Vitamins-Minerals (ABC PLUS SENIOR PO) Take by mouth.        . nystatin-triamcinolone (MYCOLOG) ointment Apply topically 2 (two) times daily.  30 g  2  . Omega-3 Fatty Acids (FISH OIL) 1200 MG CAPS Take by mouth.        . Probiotic Product (PHILLIPS COLON HEALTH PO) Take 1 tablet by mouth daily.        . valsartan-hydrochlorothiazide (DIOVAN-HCT) 320-25 MG per tablet Take 1 tablet by mouth.          Review of Systems  Constitutional: Negative for fever, chills and unexpected weight change.  HENT: Positive for congestion, rhinorrhea and postnasal drip. Negative for hearing loss, ear pain, nosebleeds, sore throat, facial swelling, sneezing, mouth sores, trouble swallowing, neck pain, neck stiffness, voice change, sinus pressure, tinnitus and ear discharge.   Eyes: Negative for pain, discharge,  redness and visual disturbance.  Respiratory: Positive for cough and chest tightness. Negative for shortness of breath, wheezing and stridor.   Cardiovascular: Negative for chest pain, palpitations and leg swelling.  Musculoskeletal: Negative for myalgias and arthralgias.  Skin: Negative for color change and rash.  Neurological: Negative for dizziness, weakness, light-headedness and headaches.  Hematological: Negative for adenopathy.       Objective:   Physical Exam  Constitutional: She is oriented to person, place, and time. She appears well-developed and well-nourished.  HENT:  Mouth/Throat: Oropharynx is clear and moist.  Eyes: EOM are normal. Pupils are equal, round, and reactive to light. No scleral icterus.  Neck: Normal range of motion. Neck supple. No JVD present. No thyromegaly present.  Cardiovascular: Normal rate, regular rhythm, normal  heart sounds and intact distal pulses.   Pulmonary/Chest: Effort normal and breath sounds normal.  Abdominal: Soft. Bowel sounds are normal. She exhibits no mass. There is no tenderness.  Musculoskeletal: Normal range of motion. She exhibits no edema.  Lymphadenopathy:    She has no cervical adenopathy.  Neurological: She is alert and oriented to person, place, and time.  Skin: Skin is warm and dry.     Psychiatric: She has a normal mood and affect.      Assessment & Plan:  Upper respiratory infection;  She has had contact with a patient who tested positive for Influenza A & B.  While patient's symptoms are mild and do not resemble those of influenza will treat with Tamiflu once daily for 5 days and give her an rx for abx to start in 48 hours if she does not improve.   Scalp laceration Her scalp wound is 85% resolved but the remaining eschar is dry and contnually irritated by tension from her hair.  I removed nonadherent eschar, trimmed her hair around the wound and placed steriel petroleum jelly on the the eschar to soften it.     Updated Medication List Outpatient Encounter Prescriptions as of 05/05/2011  Medication Sig Dispense Refill  . acetaminophen (TYLENOL) 500 MG tablet Take 500 mg by mouth as needed.        [name] BIOTIN 5000 PO Take by mouth.        . calcium carbonate (TUMS) 500 MG chewable tablet Chew 1 tablet by mouth. Taken when needed      . cholecalciferol (VITAMIN D) 1000 UNITS tablet Take 1,000 Units by mouth daily.        [name] estrogens, conjugated, (PREMARIN) 0.3 MG tablet Take 1 tablet (0.3 mg total) by mouth daily.  30 tablet  3  . fenofibrate 160 MG tablet Take 1 tablet (160 mg total) by mouth daily. For high triglycerides  30 tablet  5  . hydrocortisone (ANUSOL-HC) 25 MG suppository Place 1 suppository (25 mg total) rectally every 12 (twelve) hours.  12 suppository  1  . ibuprofen (ADVIL,MOTRIN) 200 MG tablet Take 200 mg by mouth as needed.        . loratadine (CLARITIN)  10 MG tablet Take 10 mg by mouth daily as needed.        . metFORMIN (GLUCOPHAGE-XR) 750 MG 24 hr tablet Take 750 mg by mouth daily with breakfast.        . metoprolol succinate (TOPROL-XL) 25 MG 24 hr tablet Take 1 tablet (25 mg total) by mouth daily.  30 tablet  3  . Multiple Vitamins-Minerals (ABC PLUS SENIOR PO) Take by mouth.        . nystatin-triamcinolone (MYCOLOG) ointment  Apply topically 2 (two) times daily.  30 g  2  . Omega-3 Fatty Acids (FISH OIL) 1200 MG CAPS Take by mouth.        . Probiotic Product (PHILLIPS COLON HEALTH PO) Take 1 tablet by mouth daily.        . valsartan-hydrochlorothiazide (DIOVAN-HCT) 320-25 MG per tablet Take 1 tablet by mouth.        . oseltamivir (TAMIFLU) 75 MG capsule Take 1 capsule (75 mg total) by mouth daily.  7 capsule  0  . DISCONTD: azithromycin (ZITHROMAX) 500 MG tablet Take 1 tablet (500 mg total) by mouth daily.  7 tablet  0

## 2011-05-06 NOTE — Assessment & Plan Note (Signed)
Her scalp wound is 85% resolved but the remaining eschar is dry and contnually irritated by tension from her hair.  I removed nonadherent eschar, trimmed her hair around the wound and placed steriel petroleum jelly on the the eschar to soften it.

## 2011-05-15 ENCOUNTER — Other Ambulatory Visit: Payer: Self-pay | Admitting: Internal Medicine

## 2011-05-18 ENCOUNTER — Other Ambulatory Visit: Payer: Self-pay | Admitting: Internal Medicine

## 2011-05-19 ENCOUNTER — Other Ambulatory Visit: Payer: Self-pay | Admitting: *Deleted

## 2011-05-24 ENCOUNTER — Other Ambulatory Visit: Payer: Self-pay | Admitting: *Deleted

## 2011-05-31 DIAGNOSIS — B351 Tinea unguium: Secondary | ICD-10-CM | POA: Diagnosis not present

## 2011-05-31 DIAGNOSIS — M79609 Pain in unspecified limb: Secondary | ICD-10-CM | POA: Diagnosis not present

## 2011-06-01 ENCOUNTER — Other Ambulatory Visit: Payer: Self-pay | Admitting: *Deleted

## 2011-06-01 MED ORDER — ESTROGENS CONJUGATED 0.3 MG PO TABS: ORAL_TABLET | ORAL | Status: DC

## 2011-06-09 DIAGNOSIS — H8110 Benign paroxysmal vertigo, unspecified ear: Secondary | ICD-10-CM | POA: Diagnosis not present

## 2011-06-22 ENCOUNTER — Other Ambulatory Visit: Payer: Self-pay | Admitting: *Deleted

## 2011-06-22 MED ORDER — VALSARTAN-HYDROCHLOROTHIAZIDE 320-25 MG PO TABS: 1.0000 | ORAL_TABLET | Freq: Every day | ORAL | Status: DC

## 2011-06-30 ENCOUNTER — Other Ambulatory Visit (INDEPENDENT_AMBULATORY_CARE_PROVIDER_SITE_OTHER): Payer: Medicare Other | Admitting: *Deleted

## 2011-06-30 DIAGNOSIS — E119 Type 2 diabetes mellitus without complications: Secondary | ICD-10-CM

## 2011-06-30 DIAGNOSIS — E785 Hyperlipidemia, unspecified: Secondary | ICD-10-CM

## 2011-06-30 DIAGNOSIS — I10 Essential (primary) hypertension: Secondary | ICD-10-CM | POA: Diagnosis not present

## 2011-06-30 LAB — COMPREHENSIVE METABOLIC PANEL
Albumin: 4.1 g/dL (ref 3.5–5.2)
CO2: 28 mEq/L (ref 19–32)
GFR: 44.39 mL/min — ABNORMAL LOW (ref >60.00)
Glucose, Bld: 138 mg/dL — ABNORMAL HIGH (ref 70–99)
Potassium: 4 mEq/L (ref 3.5–5.1)
Sodium: 140 mEq/L (ref 135–145)
Total Protein: 6.9 g/dL (ref 6.0–8.3)

## 2011-06-30 LAB — LIPID PANEL: Cholesterol: 161 mg/dL (ref 0–200)

## 2011-07-04 ENCOUNTER — Encounter: Payer: Self-pay | Admitting: Internal Medicine

## 2011-07-04 ENCOUNTER — Ambulatory Visit (INDEPENDENT_AMBULATORY_CARE_PROVIDER_SITE_OTHER): Payer: Medicare Other | Admitting: Internal Medicine

## 2011-07-04 DIAGNOSIS — E119 Type 2 diabetes mellitus without complications: Secondary | ICD-10-CM | POA: Diagnosis not present

## 2011-07-04 DIAGNOSIS — J45909 Unspecified asthma, uncomplicated: Secondary | ICD-10-CM | POA: Diagnosis not present

## 2011-07-04 DIAGNOSIS — M779 Enthesopathy, unspecified: Secondary | ICD-10-CM | POA: Diagnosis not present

## 2011-07-04 DIAGNOSIS — J849 Interstitial pulmonary disease, unspecified: Secondary | ICD-10-CM | POA: Insufficient documentation

## 2011-07-04 MED ORDER — ALBUTEROL SULFATE HFA 108 (90 BASE) MCG/ACT IN AERS: 2.0000 | INHALATION_SPRAY | Freq: Four times a day (QID) | RESPIRATORY_TRACT | Status: DC | PRN

## 2011-07-04 NOTE — Progress Notes (Signed)
Subjective:    Patient ID: Maureen Morales, female    DOB: February 21, 1923, 78 y.o.   MRN: [id number]  HPI    Review of Systems     Objective:   Physical Exam        Assessment & Plan:   Subjective:     Maureen Morales is a 78 y.o. female who presents for follow up of diabetes.. Current symptoms include: none. Patient denies hypoglycemia . Evaluation to date has been: fasting lipid panel and hemoglobin A1C. Home sugars: BGs consistently in an acceptable range. Current treatments: Continued metformin which has been effective. Last dilated eye exam within the past year   The following portions of the patient's history were reviewed and updated as appropriate: allergies, current medications, past family history, past medical history, past social history, past surgical history and problem list.  Review of Systems   Objective:    BP 120/68  Pulse 77  Temp(Src) 98.1 F (36.7 C) (Oral)  Resp 16  Ht 5\' 1"  (1.549 m)  Wt 128 lb (58.06 kg)  BMI 24.19 kg/m2  SpO2 96%  General Appearance:    Alert, cooperative, no distress, appears stated age  Head:    Normocephalic, without obvious abnormality, atraumatic  Eyes:    PERRL, conjunctiva/corneas clear, EOM's intact, fundi    benign, both eyes  Ears:    Normal TM's and external ear canals, both ears  Nose:   Nares normal, septum midline, mucosa normal, no drainage    or sinus tenderness  Throat:   Lips, mucosa, and tongue normal; teeth and gums normal  Neck:   Supple, symmetrical, trachea midline, no adenopathy;    thyroid:  no enlargement/tenderness/nodules; no carotid   bruit or JVD  Back:     Symmetric, no curvature, ROM normal, no CVA tenderness  Lungs:     Clear to auscultation bilaterally, respirations unlabored  Chest Wall:    No tenderness or deformity   Heart:    Regular rate and rhythm, S1 and S2 normal, no murmur, rub   or gallop     Abdomen:     Soft, non-tender, bowel sounds active all four quadrants,    no masses, no  organomegaly        Extremities:   Extremities normal, atraumatic, no cyanosis or edema  Pulses:   2+ and symmetric all extremities  Skin:   Skin color, texture, turgor normal, no rashes or lesions  Lymph nodes:   Cervical, supraclavicular, and axillary nodes normal  Neurologic:   CNII-XII intact, normal strength, sensation and reflexes    throughout      @DMFOOTEXAM @  Patient was evaluated for proper footwear and sizing.  Laboratory:    Plan:      Diabetes mellitus Continue metformin, aspirin and fish oil.  Diet is excellent,  Renal function stable. She is up to date with eye exams.   Asthma She has reported brief exacerbations managed with albuterol mdi. Triggers are unclear, but may be cleaning the soot out of her fireplace daily.  I have asked her to wear a mask and keep a diary of triggers.     Updated Medication List Outpatient Encounter Prescriptions as of 07/04/2011  Medication Sig Dispense Refill  . acetaminophen (TYLENOL) 500 MG tablet Take 500 mg by mouth as needed.        [name] BIOTIN 5000 PO Take by mouth.        . calcium carbonate (TUMS) 500 MG chewable tablet Chew 1 tablet  by mouth. Taken when needed      . cholecalciferol (VITAMIN D) 1000 UNITS tablet Take 1,000 Units by mouth daily.        [name] estrogens, conjugated, (PREMARIN) 0.3 MG tablet Take one tablet every day.  30 tablet  5  . fenofibrate 160 MG tablet Take 1 tablet (160 mg total) by mouth daily. For high triglycerides  30 tablet  5  . hydrocortisone (ANUSOL-HC) 25 MG suppository Place 1 suppository (25 mg total) rectally every 12 (twelve) hours.  12 suppository  1  . ibuprofen (ADVIL,MOTRIN) 200 MG tablet Take 200 mg by mouth as needed.        . loratadine (CLARITIN) 10 MG tablet Take 10 mg by mouth daily as needed.        . metFORMIN (GLUCOPHAGE-XR) 750 MG 24 hr tablet Take 750 mg by mouth daily with breakfast.        . metoprolol succinate (TOPROL-XL) 25 MG 24 hr tablet TAKE 1 TABLET EVERY DAY  90 tablet   2  . Multiple Vitamins-Minerals (ABC PLUS SENIOR PO) Take by mouth.        . nystatin-triamcinolone (MYCOLOG) ointment Apply topically 2 (two) times daily.  30 g  2  . Omega-3 Fatty Acids (FISH OIL) 1200 MG CAPS Take by mouth.        . Probiotic Product (PHILLIPS COLON HEALTH PO) Take 1 tablet by mouth daily.        . valsartan-hydrochlorothiazide (DIOVAN-HCT) 320-25 MG per tablet Take 1 tablet by mouth daily.  30 tablet  6  . albuterol (PROVENTIL HFA;VENTOLIN HFA) 108 (90 BASE) MCG/ACT inhaler Inhale 2 puffs into the lungs every 6 (six) hours as needed for wheezing.  1 Inhaler  11

## 2011-07-04 NOTE — Assessment & Plan Note (Signed)
She has reported brief exacerbations managed with albuterol mdi. Triggers are unclear, but may be cleaning the soot out of her fireplace daily.  I have asked her to wear a mask and keep a diary of triggers.

## 2011-07-04 NOTE — Patient Instructions (Addendum)
You may try taking your Allegra at bedtime instead of in the morning.    You can eat your grapefruit a minium of 2 hours after your blood pressure and cholesterol medications.   Please keep track of your asthma flares so we can figure out your triggers.   I am giving you a prescription for Albuterol inhaler  Please wear a mask when you are cleaning your fireplace  Please lift no more than 5 lbs at a time  Your labs are all fine.  Keep doing what you are doing!

## 2011-07-04 NOTE — Assessment & Plan Note (Signed)
Continue metformin, aspirin and fish oil.  Diet is excellent,  Renal function stable. She is up to date with eye exams.

## 2011-07-05 ENCOUNTER — Other Ambulatory Visit: Payer: Self-pay | Admitting: Internal Medicine

## 2011-07-05 MED ORDER — FENOFIBRATE 160 MG PO TABS: 160.0000 mg | ORAL_TABLET | Freq: Every day | ORAL | Status: DC

## 2011-07-20 DIAGNOSIS — R040 Epistaxis: Secondary | ICD-10-CM | POA: Diagnosis not present

## 2011-07-20 DIAGNOSIS — H6120 Impacted cerumen, unspecified ear: Secondary | ICD-10-CM | POA: Diagnosis not present

## 2011-08-08 ENCOUNTER — Other Ambulatory Visit: Payer: Self-pay | Admitting: Internal Medicine

## 2011-08-08 MED ORDER — METFORMIN HCL ER 750 MG PO TB24: 750.0000 mg | ORAL_TABLET | Freq: Every day | ORAL | Status: DC

## 2011-08-08 MED ORDER — METOPROLOL SUCCINATE ER 25 MG PO TB24: 25.0000 mg | ORAL_TABLET | Freq: Every day | ORAL | Status: DC

## 2011-08-10 ENCOUNTER — Telehealth: Payer: Self-pay | Admitting: Internal Medicine

## 2011-08-10 NOTE — Telephone Encounter (Signed)
I called patient she stated Maureen Morales scheduled her 3 month appt this morning.

## 2011-08-10 NOTE — Telephone Encounter (Signed)
Patient wants to know if she needs a 3 month follow up with blood work .

## 2011-10-03 ENCOUNTER — Telehealth: Payer: Self-pay | Admitting: *Deleted

## 2011-10-03 DIAGNOSIS — E119 Type 2 diabetes mellitus without complications: Secondary | ICD-10-CM

## 2011-10-03 NOTE — Telephone Encounter (Signed)
Ordered cmet and hgba1c

## 2011-10-03 NOTE — Telephone Encounter (Signed)
Patient is coming in for labs tomorrow. What would you like for me to order?

## 2011-10-04 ENCOUNTER — Other Ambulatory Visit (INDEPENDENT_AMBULATORY_CARE_PROVIDER_SITE_OTHER): Payer: Medicare Other | Admitting: *Deleted

## 2011-10-04 DIAGNOSIS — E119 Type 2 diabetes mellitus without complications: Secondary | ICD-10-CM | POA: Diagnosis not present

## 2011-10-04 LAB — HEMOGLOBIN A1C: Hgb A1c MFr Bld: 6.5 % (ref 4.6–6.5)

## 2011-10-05 LAB — COMPLETE METABOLIC PANEL WITH GFR
Albumin: 4.2 g/dL (ref 3.5–5.2)
Alkaline Phosphatase: 47 U/L (ref 39–117)
BUN: 32 mg/dL — ABNORMAL HIGH (ref 6–23)
GFR, Est African American: 61 mL/min
GFR, Est Non African American: 53 mL/min — ABNORMAL LOW
Glucose, Bld: 129 mg/dL — ABNORMAL HIGH (ref 70–99)
Potassium: 4.5 mEq/L (ref 3.5–5.3)
Total Bilirubin: 0.4 mg/dL (ref 0.3–1.2)

## 2011-10-06 DIAGNOSIS — H2510 Age-related nuclear cataract, unspecified eye: Secondary | ICD-10-CM | POA: Diagnosis not present

## 2011-10-11 ENCOUNTER — Ambulatory Visit: Payer: Medicare Other | Admitting: Internal Medicine

## 2011-10-17 ENCOUNTER — Ambulatory Visit (INDEPENDENT_AMBULATORY_CARE_PROVIDER_SITE_OTHER): Payer: Medicare Other | Admitting: Internal Medicine

## 2011-10-17 ENCOUNTER — Encounter: Payer: Self-pay | Admitting: Internal Medicine

## 2011-10-17 VITALS — BP 130/62 | HR 77 | Temp 97.8°F | Resp 14 | Wt 130.0 lb

## 2011-10-17 DIAGNOSIS — E785 Hyperlipidemia, unspecified: Secondary | ICD-10-CM | POA: Diagnosis not present

## 2011-10-17 DIAGNOSIS — E119 Type 2 diabetes mellitus without complications: Secondary | ICD-10-CM | POA: Diagnosis not present

## 2011-10-17 DIAGNOSIS — I10 Essential (primary) hypertension: Secondary | ICD-10-CM | POA: Diagnosis not present

## 2011-10-17 LAB — HM DIABETES EYE EXAM: HM Diabetic Eye Exam: NORMAL

## 2011-10-17 NOTE — Progress Notes (Signed)
Patient ID: Maureen Morales, female   DOB: [birth year], 78 y.o.   MRN: [id number]  Patient Active Problem List  Diagnosis  . Hypertension  . Type II diabetes mellitus, well controlled  . Hyperlipidemia  . Macular rash  . Pain, female pelvic  . Viral gastroenteritis due to Norwalk-like agent  . Hemorrhoids, internal  . Scalp laceration  . Asthma    Subjective:  CC:   Chief Complaint  Patient presents with  . Follow-up    3 month    HPI:   Maureen Morales a 78 y.o. female who presents for follow up on diabetes, hyperlipidemia.  She resumed metformin after a brief suspension because she thought her blood sugars were going too high. She denies lows,  Has no diarrhea or stomach upset with use of medication.  Is very active daily in cooking and gardening. .  No lows.  Denies insomnia, joint pain, memory problems.    Past Medical History  Diagnosis Date  . Hypertension   . Diabetes mellitus   . Hyperlipidemia   . Viral gastroenteritis due to Norwalk-like agent Nov 2012    Past Surgical History  Procedure Date  . Bladder surgery   . Abdominal hysterectomy          The following portions of the patient's history were reviewed and updated as appropriate: Allergies, current medications, and problem list.    Review of Systems:   12 Pt  review of systems was negative except those addressed in the HPI,     History   Social History  . Marital Status: Single    Spouse Name: N/A    Number of Children: N/A  . Years of Education: N/A   Occupational History  . Not on file.   Social History Main Topics  . Smoking status: Never Smoker   . Smokeless tobacco: Never Used  . Alcohol Use: No  . Drug Use: No  . Sexually Active: Not on file   Other Topics Concern  . Not on file   Social History Narrative  . No narrative on file    Objective:  BP 130/62  Pulse 77  Temp 97.8 F (36.6 C) (Oral)  Resp 14  Wt 130 lb (58.968 kg)  SpO2 96%  General appearance:  alert, cooperative and appears stated age Ears: normal TM's and external ear canals both ears Throat: lips, mucosa, and tongue normal; teeth and gums normal Neck: no adenopathy, no carotid bruit, supple, symmetrical, trachea midline and thyroid not enlarged, symmetric, no tenderness/mass/nodules Back: symmetric, no curvature. ROM normal. No CVA tenderness. Lungs: clear to auscultation bilaterally Heart: regular rate and rhythm, S1, S2 normal, no murmur, click, rub or gallop Abdomen: soft, non-tender; bowel sounds normal; no masses,  no organomegaly Pulses: 2+ and symmetric Skin: Skin color, texture, turgor normal. No rashes or lesions Lymph nodes: Cervical, supraclavicular, and axillary nodes normal.  Assessment and Plan:  Type II diabetes mellitus, well controlled .Well controlled on current regimen, HgbA1c < 7.0.  No changes  Today.  Hypertension Well controlled on current regimen. Renal function stable, no changes today.  Hyperlipidemia Well controlled, LDL at goal on current therapy.  No changes today.  Repeat CMET and lipids in 3 months    Updated Medication List Outpatient Encounter Prescriptions as of 10/17/2011  Medication Sig Dispense Refill  . acetaminophen (TYLENOL) 500 MG tablet Take 500 mg by mouth as needed.        [name] albuterol (PROVENTIL HFA;VENTOLIN HFA) 108 (90 BASE)  MCG/ACT inhaler Inhale 2 puffs into the lungs every 6 (six) hours as needed for wheezing.  1 Inhaler  11  . BIOTIN 5000 PO Take by mouth.        . calcium carbonate (TUMS) 500 MG chewable tablet Chew 1 tablet by mouth. Taken when needed      . cholecalciferol (VITAMIN D) 1000 UNITS tablet Take 1,000 Units by mouth daily.        [name] estrogens, conjugated, (PREMARIN) 0.3 MG tablet Take one tablet every day.  30 tablet  5  . fenofibrate 160 MG tablet Take 1 tablet (160 mg total) by mouth daily. For high triglycerides  30 tablet  5  . hydrocortisone (ANUSOL-HC) 25 MG suppository Place 1 suppository (25 mg  total) rectally every 12 (twelve) hours.  12 suppository  1  . ibuprofen (ADVIL,MOTRIN) 200 MG tablet Take 200 mg by mouth as needed.        . loratadine (CLARITIN) 10 MG tablet Take 10 mg by mouth daily as needed.        . metFORMIN (GLUCOPHAGE-XR) 750 MG 24 hr tablet Take 1 tablet (750 mg total) by mouth daily with breakfast.  30 tablet  5  . metoprolol succinate (TOPROL-XL) 25 MG 24 hr tablet Take 1 tablet (25 mg total) by mouth daily.  30 tablet  5  . Multiple Vitamins-Minerals (ABC PLUS SENIOR PO) Take by mouth.        . nystatin-triamcinolone (MYCOLOG) ointment Apply topically 2 (two) times daily.  30 g  2  . Omega-3 Fatty Acids (FISH OIL) 1200 MG CAPS Take by mouth.        . Probiotic Product (PHILLIPS COLON HEALTH PO) Take 1 tablet by mouth daily.        . valsartan-hydrochlorothiazide (DIOVAN-HCT) 320-25 MG per tablet Take 1 tablet by mouth daily.  30 tablet  6     No orders of the defined types were placed in this encounter.    Return in about 3 months (around 01/17/2012).

## 2011-10-18 NOTE — Assessment & Plan Note (Signed)
Well controlled, LDL at goal on current therapy.  No changes today.  Repeat CMET and lipids in 3 months   

## 2011-10-18 NOTE — Assessment & Plan Note (Signed)
.  Well controlled on current regimen, HgbA1c < 7.0.  No changes  Today.

## 2011-10-18 NOTE — Assessment & Plan Note (Signed)
Well controlled on current regimen. Renal function stable, no changes today. 

## 2011-11-16 ENCOUNTER — Other Ambulatory Visit: Payer: Self-pay | Admitting: *Deleted

## 2011-11-16 MED ORDER — ESTROGENS CONJUGATED 0.3 MG PO TABS: ORAL_TABLET | ORAL | Status: DC

## 2011-12-27 ENCOUNTER — Other Ambulatory Visit: Payer: Self-pay | Admitting: Internal Medicine

## 2011-12-27 MED ORDER — FENOFIBRATE 160 MG PO TABS: 160.0000 mg | ORAL_TABLET | Freq: Every day | ORAL | Status: DC

## 2012-01-05 DIAGNOSIS — N12 Tubulo-interstitial nephritis, not specified as acute or chronic: Secondary | ICD-10-CM | POA: Diagnosis not present

## 2012-01-08 DIAGNOSIS — N12 Tubulo-interstitial nephritis, not specified as acute or chronic: Secondary | ICD-10-CM | POA: Diagnosis not present

## 2012-01-15 ENCOUNTER — Telehealth: Payer: Self-pay | Admitting: Internal Medicine

## 2012-01-15 NOTE — Telephone Encounter (Signed)
I spoke with patient and advised her of the Medicare policy.

## 2012-01-15 NOTE — Telephone Encounter (Signed)
Ms. Fyfe dropped off eggplant today.  While I was thanking her for it, I incorrectly answered her question about why we were not letting patients get their labs ahead of their visit.  The correct reason we are no longer doing it that way is because Medicare will no longer  pay for labs that are ordered and drawn  before a patient comes in for their annual Wellness visit. Any other time is OK>

## 2012-01-17 ENCOUNTER — Other Ambulatory Visit: Payer: Self-pay | Admitting: Internal Medicine

## 2012-01-17 MED ORDER — VALSARTAN-HYDROCHLOROTHIAZIDE 320-25 MG PO TABS: 1.0000 | ORAL_TABLET | Freq: Every day | ORAL | Status: DC

## 2012-01-24 ENCOUNTER — Ambulatory Visit (INDEPENDENT_AMBULATORY_CARE_PROVIDER_SITE_OTHER): Payer: Medicare Other | Admitting: Internal Medicine

## 2012-01-24 ENCOUNTER — Encounter: Payer: Self-pay | Admitting: Internal Medicine

## 2012-01-24 VITALS — BP 136/64 | HR 72 | Temp 97.8°F | Resp 14 | Wt 130.2 lb

## 2012-01-24 DIAGNOSIS — N390 Urinary tract infection, site not specified: Secondary | ICD-10-CM | POA: Insufficient documentation

## 2012-01-24 DIAGNOSIS — E559 Vitamin D deficiency, unspecified: Secondary | ICD-10-CM | POA: Diagnosis not present

## 2012-01-24 DIAGNOSIS — E785 Hyperlipidemia, unspecified: Secondary | ICD-10-CM

## 2012-01-24 DIAGNOSIS — E119 Type 2 diabetes mellitus without complications: Secondary | ICD-10-CM | POA: Diagnosis not present

## 2012-01-24 DIAGNOSIS — Z1239 Encounter for other screening for malignant neoplasm of breast: Secondary | ICD-10-CM

## 2012-01-24 DIAGNOSIS — I10 Essential (primary) hypertension: Secondary | ICD-10-CM

## 2012-01-24 LAB — COMPREHENSIVE METABOLIC PANEL
AST: 27 U/L (ref 0–37)
Albumin: 3.9 g/dL (ref 3.5–5.2)
Alkaline Phosphatase: 52 U/L (ref 39–117)
BUN: 34 mg/dL — ABNORMAL HIGH (ref 6–23)
Potassium: 4.1 mEq/L (ref 3.5–5.1)
Total Bilirubin: 0.5 mg/dL (ref 0.3–1.2)

## 2012-01-24 LAB — POCT URINALYSIS DIPSTICK
Bilirubin, UA: NEGATIVE
Glucose, UA: NEGATIVE
Leukocytes, UA: NEGATIVE
Nitrite, UA: NEGATIVE
pH, UA: 7

## 2012-01-24 LAB — HEMOGLOBIN A1C: Hgb A1c MFr Bld: 6.7 % — ABNORMAL HIGH (ref 4.6–6.5)

## 2012-01-24 NOTE — Patient Instructions (Addendum)
Have Tina activate your MyChart account so I can send you your results  You can also use it to send me nonurgent messages   My cell phone is [phone] for emergencies

## 2012-01-24 NOTE — Assessment & Plan Note (Addendum)
Historically well controlled. Hemoglobin A1c has been on drawn today. No changes to medications unless A1c is over 7 0 given her age. Foot exam was normal with regard to sensation to microfilament today. She is up-to-date on eye exams.

## 2012-01-24 NOTE — Assessment & Plan Note (Signed)
Well controlled historically. No changes to medications.

## 2012-01-24 NOTE — Progress Notes (Signed)
Patient ID: Maureen Morales, female   DOB: Apr 11, 1923, 78 y.o.   MRN: [id number]  Patient Active Problem List  Diagnosis  . Hypertension  . Type II diabetes mellitus, well controlled  . Hyperlipidemia  . Macular rash  . Pain, female pelvic  . Viral gastroenteritis due to Norwalk-like agent  . Hemorrhoids, internal  . Scalp laceration  . Asthma  . UTI (lower urinary tract infection)    Subjective:  CC:   Chief Complaint  Patient presents with  . Follow-up    3 month    HPI:   Maureen Morales a 78 y.o. female who presents  Three-month followup on diabetes. She was recently treated for cystitis with fevers to 102 by an urgent care physician during my week of vacation.  Family very frustrated bc they could not get through on the phone to leave a message.  Son Maureen Morales has altready chosen to find another doctor bc of access issues.  Patient has been distraught lately due to loss of sister and 2 other family memebers.  Sugars have been elevated to as high as 160 in the mornings.. Her urinary symptoms have resolved. She is due for her mammogram.   Past Medical History  Diagnosis Date  . Hypertension   . Diabetes mellitus   . Hyperlipidemia   . Viral gastroenteritis due to Norwalk-like agent Nov 2012    Past Surgical History  Procedure Date  . Bladder surgery   . Abdominal hysterectomy          The following portions of the patient's history were reviewed and updated as appropriate: Allergies, current medications, and problem list.    Review of Systems:   12 Pt  review of systems was negative except those addressed in the HPI,     History   Social History  . Marital Status: Single    Spouse Name: N/A    Number of Children: N/A  . Years of Education: N/A   Occupational History  . Not on file.   Social History Main Topics  . Smoking status: Never Smoker   . Smokeless tobacco: Never Used  . Alcohol Use: No  . Drug Use: No  . Sexually Active: Not on file    Other Topics Concern  . Not on file   Social History Narrative  . No narrative on file    Objective:  BP 136/64  Pulse 72  Temp 97.8 F (36.6 C) (Oral)  Resp 14  Wt 130 lb 4 oz (59.081 kg)  SpO2 99%  General appearance: alert, cooperative and appears stated age Ears: normal TM's and external ear canals both ears Throat: lips, mucosa, and tongue normal; teeth and gums normal Neck: no adenopathy, no carotid bruit, supple, symmetrical, trachea midline and thyroid not enlarged, symmetric, no tenderness/mass/nodules Back: symmetric, no curvature. ROM normal. No CVA tenderness. Lungs: clear to auscultation bilaterally Heart: regular rate and rhythm, S1, S2 normal, no murmur, click, rub or gallop Abdomen: soft, non-tender; bowel sounds normal; no masses,  no organomegaly Pulses: 2+ and symmetric Skin: Skin color, texture, turgor normal. No rashes or lesions Lymph nodes: Cervical, supraclavicular, and axillary nodes normal.  Assessment and Plan:  Type II diabetes mellitus, well controlled Historically well controlled. Hemoglobin A1c has been on drawn today. No changes to medications unless A1c is over 7 0 given her age. Foot exam was normal with regard to sensation to microfilament today. She is up-to-date on eye exams.  Hypertension Well-controlled on current medications.  Hyperlipidemia  Well controlled historically. No changes to medications.  UTI (lower urinary tract infection) Her recent urinary tract infections accompanied by fever to 102. Her symptoms have resolved with the treatment. She  requested a UA today which was normal   Updated Medication List Outpatient Encounter Prescriptions as of 01/24/2012  Medication Sig Dispense Refill  . acetaminophen (TYLENOL) 500 MG tablet Take 500 mg by mouth as needed.        [name] albuterol (PROVENTIL HFA;VENTOLIN HFA) 108 (90 BASE) MCG/ACT inhaler Inhale 2 puffs into the lungs every 6 (six) hours as needed for wheezing.  1 Inhaler   11  . BIOTIN 5000 PO Take by mouth.        . calcium carbonate (TUMS) 500 MG chewable tablet Chew 1 tablet by mouth. Taken when needed      . cholecalciferol (VITAMIN D) 1000 UNITS tablet Take 1,000 Units by mouth daily.        [name] estrogens, conjugated, (PREMARIN) 0.3 MG tablet Take one tablet every day.  30 tablet  5  . fenofibrate 160 MG tablet Take 1 tablet (160 mg total) by mouth daily. For high triglycerides  30 tablet  5  . ibuprofen (ADVIL,MOTRIN) 200 MG tablet Take 200 mg by mouth as needed.        . loratadine (CLARITIN) 10 MG tablet Take 10 mg by mouth daily as needed.        . metFORMIN (GLUCOPHAGE-XR) 750 MG 24 hr tablet Take 1 tablet (750 mg total) by mouth daily with breakfast.  30 tablet  5  . metoprolol succinate (TOPROL-XL) 25 MG 24 hr tablet Take 1 tablet (25 mg total) by mouth daily.  30 tablet  5  . Multiple Vitamins-Minerals (ABC PLUS SENIOR PO) Take by mouth.        . Omega-3 Fatty Acids (FISH OIL) 1200 MG CAPS Take by mouth.        . Probiotic Product (PHILLIPS COLON HEALTH PO) Take 1 tablet by mouth daily.        . valsartan-hydrochlorothiazide (DIOVAN-HCT) 320-25 MG per tablet Take 1 tablet by mouth daily.  30 tablet  6  . DISCONTD: hydrocortisone (ANUSOL-HC) 25 MG suppository Place 1 suppository (25 mg total) rectally every 12 (twelve) hours.  12 suppository  1     Orders Placed This Encounter  Procedures  . Urine culture  . MM Digital Screening  . Vitamin D 25 hydroxy  . Comprehensive metabolic panel  . Hemoglobin A1c  . POCT urinalysis dipstick    No Follow-up on file.

## 2012-01-24 NOTE — Assessment & Plan Note (Signed)
Well-controlled on current medications 

## 2012-01-24 NOTE — Assessment & Plan Note (Signed)
Her recent urinary tract infections accompanied by fever to 102. Her symptoms have resolved with the treatment. She  requested a UA today which was normal

## 2012-02-01 ENCOUNTER — Telehealth: Payer: Self-pay | Admitting: Internal Medicine

## 2012-02-01 MED ORDER — METOPROLOL SUCCINATE ER 25 MG PO TB24: 25.0000 mg | ORAL_TABLET | Freq: Every day | ORAL | Status: DC

## 2012-02-01 NOTE — Telephone Encounter (Signed)
Refill request metoprolol succinate 25 mg ter #30 Sig: take 1 tablet every day

## 2012-02-12 ENCOUNTER — Other Ambulatory Visit: Payer: Self-pay

## 2012-02-12 MED ORDER — METFORMIN HCL ER 750 MG PO TB24: 750.0000 mg | ORAL_TABLET | Freq: Every day | ORAL | Status: DC

## 2012-02-12 NOTE — Telephone Encounter (Signed)
Metformin 750 mg #30 5R  sent in electronic to [facility]. Patient has scheduled appointment for follow up.

## 2012-02-14 ENCOUNTER — Other Ambulatory Visit: Payer: Self-pay

## 2012-02-21 DIAGNOSIS — Z23 Encounter for immunization: Secondary | ICD-10-CM | POA: Diagnosis not present

## 2012-03-06 ENCOUNTER — Ambulatory Visit: Payer: Self-pay | Admitting: Internal Medicine

## 2012-03-06 DIAGNOSIS — Z1231 Encounter for screening mammogram for malignant neoplasm of breast: Secondary | ICD-10-CM | POA: Diagnosis not present

## 2012-03-15 ENCOUNTER — Encounter: Payer: Self-pay | Admitting: Internal Medicine

## 2012-03-25 ENCOUNTER — Emergency Department: Payer: Self-pay | Admitting: Emergency Medicine

## 2012-03-25 DIAGNOSIS — M542 Cervicalgia: Secondary | ICD-10-CM | POA: Diagnosis not present

## 2012-03-25 DIAGNOSIS — IMO0002 Reserved for concepts with insufficient information to code with codable children: Secondary | ICD-10-CM | POA: Diagnosis not present

## 2012-03-25 DIAGNOSIS — S0003XA Contusion of scalp, initial encounter: Secondary | ICD-10-CM | POA: Diagnosis not present

## 2012-03-25 DIAGNOSIS — S060X9A Concussion with loss of consciousness of unspecified duration, initial encounter: Secondary | ICD-10-CM | POA: Diagnosis not present

## 2012-03-25 DIAGNOSIS — S139XXA Sprain of joints and ligaments of unspecified parts of neck, initial encounter: Secondary | ICD-10-CM | POA: Diagnosis not present

## 2012-03-25 DIAGNOSIS — S060X1A Concussion with loss of consciousness of 30 minutes or less, initial encounter: Secondary | ICD-10-CM | POA: Diagnosis not present

## 2012-03-25 DIAGNOSIS — M4310 Spondylolisthesis, site unspecified: Secondary | ICD-10-CM | POA: Diagnosis not present

## 2012-03-25 DIAGNOSIS — M25519 Pain in unspecified shoulder: Secondary | ICD-10-CM | POA: Diagnosis not present

## 2012-03-25 DIAGNOSIS — I10 Essential (primary) hypertension: Secondary | ICD-10-CM | POA: Diagnosis not present

## 2012-03-25 DIAGNOSIS — M5137 Other intervertebral disc degeneration, lumbosacral region: Secondary | ICD-10-CM | POA: Diagnosis not present

## 2012-03-25 DIAGNOSIS — S339XXA Sprain of unspecified parts of lumbar spine and pelvis, initial encounter: Secondary | ICD-10-CM | POA: Diagnosis not present

## 2012-03-25 DIAGNOSIS — E119 Type 2 diabetes mellitus without complications: Secondary | ICD-10-CM | POA: Diagnosis not present

## 2012-04-17 ENCOUNTER — Encounter: Payer: Self-pay | Admitting: Internal Medicine

## 2012-04-17 ENCOUNTER — Ambulatory Visit (INDEPENDENT_AMBULATORY_CARE_PROVIDER_SITE_OTHER): Payer: Medicare Other | Admitting: Internal Medicine

## 2012-04-17 VITALS — BP 128/62 | HR 73 | Temp 97.5°F | Resp 12 | Ht 61.0 in | Wt 128.5 lb

## 2012-04-17 DIAGNOSIS — R011 Cardiac murmur, unspecified: Secondary | ICD-10-CM

## 2012-04-17 DIAGNOSIS — I10 Essential (primary) hypertension: Secondary | ICD-10-CM

## 2012-04-17 DIAGNOSIS — Z1331 Encounter for screening for depression: Secondary | ICD-10-CM

## 2012-04-17 DIAGNOSIS — R21 Rash and other nonspecific skin eruption: Secondary | ICD-10-CM

## 2012-04-17 DIAGNOSIS — E785 Hyperlipidemia, unspecified: Secondary | ICD-10-CM

## 2012-04-17 DIAGNOSIS — E559 Vitamin D deficiency, unspecified: Secondary | ICD-10-CM | POA: Diagnosis not present

## 2012-04-17 DIAGNOSIS — E119 Type 2 diabetes mellitus without complications: Secondary | ICD-10-CM

## 2012-04-17 NOTE — Progress Notes (Signed)
Patient ID: Maureen Morales, female   DOB: December 04, 1922, 78 y.o.   MRN: [id number]  Patient Active Problem List  Diagnosis  . Hypertension  . Type II diabetes mellitus, well controlled  . Hyperlipidemia  . Macular rash  . Hemorrhoids, internal  . Asthma    Subjective:  CC:   Chief Complaint  Patient presents with  . Follow-up    HPI:   Maureen Morales a 78 y.o. female who presents 1) Follow up on diabetes mellitus and hyperlipidemia.  2) She has a new lesion on her left arm on her biceps area.  The spot has been present since yesterday.  She has no history of insect bites.  Doesn't itch.  Red scaling and round.  4 mm diameter  3)she has noticed mild swelling of the medial side of her left knee.  Denies pain, recent trauma, nor recent trips or prolonged immobilIzation or sedentary periods.   4) she is having some indigestion, described only as indigestion.  It occurs in the evening, and not  with exertion during the day.  resolves with Tums. Aggravated by stress,  As she has been more nervous lately due to husband's controlling nature and possessiveness.    Had a fall in November after tripping on the trash can in the kitchen, and went to ER for head trauma. Has a small scab on occiput.  CT head was done and negative .     Past Medical History  Diagnosis Date  . Hypertension   . Diabetes mellitus   . Hyperlipidemia   . Viral gastroenteritis due to Norwalk-like agent Nov 2012    Past Surgical History  Procedure Date  . Bladder surgery   . Abdominal hysterectomy          The following portions of the patient's history were reviewed and updated as appropriate: Allergies, current medications, and problem list.    Review of Systems:   Patient denies headache, fevers, malaise, unintentional weight loss, skin rash, eye pain, sinus congestion and sinus pain, sore throat, dysphagia,  hemoptysis , cough, dyspnea, wheezing, chest pain, palpitations, orthopnea, edema, abdominal pain,  nausea, melena, diarrhea, constipation, flank pain, dysuria, hematuria, urinary  Frequency, nocturia, numbness, tingling, seizures,  Focal weakness, Loss of consciousness,  Tremor, insomnia, depression,and suicidal ideation.       History   Social History  . Marital Status: Single    Spouse Name: N/A    Number of Children: N/A  . Years of Education: N/A   Occupational History  . Not on file.   Social History Main Topics  . Smoking status: Never Smoker   . Smokeless tobacco: Never Used  . Alcohol Use: No  . Drug Use: No  . Sexually Active: Not on file   Other Topics Concern  . Not on file   Social History Narrative  . No narrative on file    Objective:  BP 128/62  Pulse 73  Temp 97.5 F (36.4 C) (Oral)  Resp 12  Ht 5\' 1"  (1.549 m)  Wt 128 lb 8 oz (58.287 kg)  BMI 24.28 kg/m2  SpO2 99%  General appearance: alert, cooperative and appears stated age Ears: normal TM's and external ear canals both ears Throat: lips, mucosa, and tongue normal; teeth and gums normal Neck: no adenopathy, no carotid bruit, supple, symmetrical, trachea midline and thyroid not enlarged, symmetric, no tenderness/mass/nodules Back: symmetric, no curvature. ROM normal. No CVA tenderness. Lungs: clear to auscultation bilaterally Heart: regular rate and rhythm, S1,  S2 normal, no murmur, click, rub or gallop Abdomen: soft, non-tender; bowel sounds normal; no masses,  no organomegaly Pulses: 2+ and symmetric Skin: Skin color, texture, turgor normal. 4 mm circular macular lesion,  red, on left bicep Lymph nodes: Cervical, supraclavicular, and axillary nodes normal. Foot exam:  Nails are well trimmed,  No callouses,  Sensation intact to microfilament  Assessment and Plan:  Type II diabetes mellitus, well controlled Well controlled historically on minimal medications.   Up to date on eye and foot exams.  Return after the holidasy for A1c  And other labs.   Macular rash Recurrent. With spot on  left bicep.  Present only 1 day.  Will watch for now.   Hypertension well controlled   Hyperlipidemia LDL was 80 at last check on fenofibrate.  Repeat due.    Updated Medication List Outpatient Encounter Prescriptions as of 04/17/2012  Medication Sig Dispense Refill  . acetaminophen (TYLENOL) 500 MG tablet Take 500 mg by mouth as needed.        [name] albuterol (PROVENTIL HFA;VENTOLIN HFA) 108 (90 BASE) MCG/ACT inhaler Inhale 2 puffs into the lungs every 6 (six) hours as needed for wheezing.  1 Inhaler  11  . BIOTIN 5000 PO Take by mouth.        . calcium carbonate (TUMS) 500 MG chewable tablet Chew 1 tablet by mouth. Taken when needed      . cholecalciferol (VITAMIN D) 1000 UNITS tablet Take 1,000 Units by mouth daily.        [name] estrogens, conjugated, (PREMARIN) 0.3 MG tablet Take one tablet every day.  30 tablet  5  . fenofibrate 160 MG tablet Take 1 tablet (160 mg total) by mouth daily. For high triglycerides  30 tablet  5  . ibuprofen (ADVIL,MOTRIN) 200 MG tablet Take 200 mg by mouth as needed.        . loratadine (CLARITIN) 10 MG tablet Take 10 mg by mouth daily as needed.        . metFORMIN (GLUCOPHAGE-XR) 750 MG 24 hr tablet Take 1 tablet (750 mg total) by mouth daily with breakfast.  30 tablet  5  . metoprolol succinate (TOPROL-XL) 25 MG 24 hr tablet Take 1 tablet (25 mg total) by mouth daily.  30 tablet  1  . Multiple Vitamins-Minerals (ABC PLUS SENIOR PO) Take by mouth.        . Omega-3 Fatty Acids (FISH OIL) 1200 MG CAPS Take by mouth.        . Probiotic Product (PHILLIPS COLON HEALTH PO) Take 1 tablet by mouth daily.        . valsartan-hydrochlorothiazide (DIOVAN-HCT) 320-25 MG per tablet Take 1 tablet by mouth daily.  30 tablet  6     Orders Placed This Encounter  Procedures  . HM MAMMOGRAPHY  . Lipid panel  . Hemoglobin A1c  . Comprehensive metabolic panel  . Vitamin D 25 hydroxy  . Microalbumin / creatinine urine ratio  . Ambulatory referral to Cardiology  . HM  DIABETES EYE EXAM  . HM DIABETES FOOT EXAM    No Follow-up on file.

## 2012-04-17 NOTE — Patient Instructions (Signed)
Come back after Christmas for your regular blood work,  I will call you with the results.

## 2012-04-21 ENCOUNTER — Encounter: Payer: Self-pay | Admitting: Internal Medicine

## 2012-04-21 NOTE — Assessment & Plan Note (Addendum)
Well controlled historically on minimal medications.   Up to date on eye and foot exams.  Return after the holidasy for A1c  And other labs.

## 2012-04-21 NOTE — Assessment & Plan Note (Signed)
LDL was 80 at last check on fenofibrate.  Repeat due.

## 2012-04-21 NOTE — Assessment & Plan Note (Signed)
Recurrent. With spot on left bicep.  Present only 1 day.  Will watch for now.

## 2012-04-21 NOTE — Assessment & Plan Note (Signed)
well controlled  

## 2012-04-26 ENCOUNTER — Other Ambulatory Visit (INDEPENDENT_AMBULATORY_CARE_PROVIDER_SITE_OTHER): Payer: Medicare Other

## 2012-04-26 DIAGNOSIS — E559 Vitamin D deficiency, unspecified: Secondary | ICD-10-CM

## 2012-04-26 DIAGNOSIS — E119 Type 2 diabetes mellitus without complications: Secondary | ICD-10-CM

## 2012-04-26 LAB — COMPREHENSIVE METABOLIC PANEL
AST: 29 U/L (ref 0–37)
Albumin: 3.7 g/dL (ref 3.5–5.2)
Alkaline Phosphatase: 40 U/L (ref 39–117)
BUN: 34 mg/dL — ABNORMAL HIGH (ref 6–23)
Glucose, Bld: 148 mg/dL — ABNORMAL HIGH (ref 70–99)
Potassium: 4.4 mEq/L (ref 3.5–5.1)
Sodium: 142 mEq/L (ref 135–145)
Total Bilirubin: 0.5 mg/dL (ref 0.3–1.2)

## 2012-04-26 LAB — LIPID PANEL
Cholesterol: 153 mg/dL (ref 0–200)
LDL Cholesterol: 89 mg/dL (ref 0–99)
Total CHOL/HDL Ratio: 3
Triglycerides: 71 mg/dL (ref 0.0–149.0)
VLDL: 14.2 mg/dL (ref 0.0–40.0)

## 2012-04-26 LAB — MICROALBUMIN / CREATININE URINE RATIO
Microalb Creat Ratio: 4.3 mg/g (ref 0.0–30.0)
Microalb, Ur: 2.5 mg/dL — ABNORMAL HIGH (ref 0.0–1.9)

## 2012-04-27 LAB — VITAMIN D 25 HYDROXY (VIT D DEFICIENCY, FRACTURES): Vit D, 25-Hydroxy: 41 ng/mL (ref 30–89)

## 2012-05-06 ENCOUNTER — Encounter: Payer: Self-pay | Admitting: Cardiovascular Disease

## 2012-05-06 ENCOUNTER — Ambulatory Visit (INDEPENDENT_AMBULATORY_CARE_PROVIDER_SITE_OTHER): Payer: Medicare Other | Admitting: Cardiovascular Disease

## 2012-05-06 VITALS — BP 140/64 | HR 74 | Ht 61.0 in | Wt 127.8 lb

## 2012-05-06 DIAGNOSIS — E785 Hyperlipidemia, unspecified: Secondary | ICD-10-CM

## 2012-05-06 DIAGNOSIS — R011 Cardiac murmur, unspecified: Secondary | ICD-10-CM

## 2012-05-06 DIAGNOSIS — I350 Nonrheumatic aortic (valve) stenosis: Secondary | ICD-10-CM | POA: Insufficient documentation

## 2012-05-06 DIAGNOSIS — I060 Rheumatic aortic stenosis: Secondary | ICD-10-CM | POA: Insufficient documentation

## 2012-05-06 DIAGNOSIS — I10 Essential (primary) hypertension: Secondary | ICD-10-CM | POA: Diagnosis not present

## 2012-05-06 NOTE — Assessment & Plan Note (Signed)
Lab Results  Component Value Date   CHOL 153 04/26/2012   HDL 50.20 04/26/2012   LDLCALC 89 04/26/2012   LDLDIRECT 150.9 05/26/2008   TRIG 71.0 04/26/2012   CHOLHDL 3 04/26/2012   Recent lipid profile was optimal.
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
## 2012-05-06 NOTE — Assessment & Plan Note (Signed)
Her blood pressure is well controlled. 

## 2012-05-06 NOTE — Assessment & Plan Note (Signed)
The patient has a heart murmur suggestive of aortic stenosis which does not seem to be severe by physical exam. I recommend an echocardiogram for evaluation. She does not appear to be symptomatic from this. If this worsens and becomes symptomatic in the future, she might be a candidate for TAVR.

## 2012-05-06 NOTE — Patient Instructions (Addendum)
Your physician has requested that you have an echocardiogram. Echocardiography is a painless test that uses sound waves to create images of your heart. It provides your doctor with information about the size and shape of your heart and how well your heart's chambers and valves are working. This procedure takes approximately one hour. There are no restrictions for this procedure.  Follow up as needed 

## 2012-05-07 ENCOUNTER — Other Ambulatory Visit: Payer: Self-pay | Admitting: Internal Medicine

## 2012-05-07 MED ORDER — ESTROGENS CONJUGATED 0.3 MG PO TABS: ORAL_TABLET | ORAL | Status: DC

## 2012-05-07 NOTE — Telephone Encounter (Signed)
estrogens, conjugated, (PREMARIN) 0.3 MG tablet  #30

## 2012-05-07 NOTE — Telephone Encounter (Signed)
Med filled.  

## 2012-05-08 ENCOUNTER — Telehealth: Payer: Self-pay | Admitting: Internal Medicine

## 2012-05-08 DIAGNOSIS — L989 Disorder of the skin and subcutaneous tissue, unspecified: Secondary | ICD-10-CM

## 2012-05-08 NOTE — Telephone Encounter (Signed)
Pt called the place on her arm is not any better.  Pt wanted to know if dr Darrick Huntsman wanted to see her again or send her to dermotologist

## 2012-05-08 NOTE — Telephone Encounter (Signed)
Pt would like to see Dr. Norval Gable office.

## 2012-05-08 NOTE — Telephone Encounter (Signed)
I would like her to see Dermatology, since I don't know what it is.  Does she have a preference?

## 2012-05-08 NOTE — Telephone Encounter (Signed)
Amber I believe  Dermatology is Dr Abbey Chatters old practice. Is that correct? Dr Roseanne Kaufman and Dr Adolphus Birchwood

## 2012-05-28 ENCOUNTER — Other Ambulatory Visit (INDEPENDENT_AMBULATORY_CARE_PROVIDER_SITE_OTHER): Payer: Medicare Other

## 2012-05-28 ENCOUNTER — Other Ambulatory Visit: Payer: Self-pay

## 2012-05-28 DIAGNOSIS — R011 Cardiac murmur, unspecified: Secondary | ICD-10-CM

## 2012-05-28 DIAGNOSIS — I359 Nonrheumatic aortic valve disorder, unspecified: Secondary | ICD-10-CM

## 2012-06-26 DIAGNOSIS — L259 Unspecified contact dermatitis, unspecified cause: Secondary | ICD-10-CM | POA: Diagnosis not present

## 2012-06-30 ENCOUNTER — Encounter: Payer: Self-pay | Admitting: Internal Medicine

## 2012-07-03 ENCOUNTER — Other Ambulatory Visit: Payer: Self-pay | Admitting: *Deleted

## 2012-07-03 MED ORDER — FENOFIBRATE 160 MG PO TABS: 160.0000 mg | ORAL_TABLET | Freq: Every day | ORAL | Status: DC

## 2012-07-03 NOTE — Telephone Encounter (Signed)
Med filled.  

## 2012-07-29 ENCOUNTER — Encounter: Payer: Self-pay | Admitting: Internal Medicine

## 2012-07-29 ENCOUNTER — Ambulatory Visit (INDEPENDENT_AMBULATORY_CARE_PROVIDER_SITE_OTHER): Payer: Medicare Other | Admitting: Internal Medicine

## 2012-07-29 ENCOUNTER — Encounter: Payer: Self-pay | Admitting: General Practice

## 2012-07-29 VITALS — BP 146/68 | HR 68 | Temp 97.5°F | Resp 16 | Wt 130.8 lb

## 2012-07-29 DIAGNOSIS — I350 Nonrheumatic aortic (valve) stenosis: Secondary | ICD-10-CM

## 2012-07-29 DIAGNOSIS — I10 Essential (primary) hypertension: Secondary | ICD-10-CM | POA: Diagnosis not present

## 2012-07-29 DIAGNOSIS — E119 Type 2 diabetes mellitus without complications: Secondary | ICD-10-CM

## 2012-07-29 DIAGNOSIS — I359 Nonrheumatic aortic valve disorder, unspecified: Secondary | ICD-10-CM | POA: Diagnosis not present

## 2012-07-29 LAB — COMPREHENSIVE METABOLIC PANEL
CO2: 26 mEq/L (ref 19–32)
GFR: 53.83 mL/min — ABNORMAL LOW (ref >60.00)
Glucose, Bld: 144 mg/dL — ABNORMAL HIGH (ref 70–99)
Sodium: 138 mEq/L (ref 135–145)
Total Bilirubin: 0.3 mg/dL (ref 0.3–1.2)
Total Protein: 7 g/dL (ref 6.0–8.3)

## 2012-07-29 LAB — HM DIABETES FOOT EXAM: HM Diabetic Foot Exam: NORMAL

## 2012-07-29 MED ORDER — TETANUS-DIPHTH-ACELL PERTUSSIS 5-2.5-18.5 LF-MCG/0.5 IM SUSP: 0.5000 mL | Freq: Once | INTRAMUSCULAR | Status: DC

## 2012-07-29 NOTE — Assessment & Plan Note (Signed)
Well-controlled on current medications.  hemoglobin A1c has been consistently less than 7.0 . He is up-to-date on eye exams and her foot exam is norma.l we'll repeat his urine microalbumin to creatinine ratio at next visit. She is on the appropriate medications.

## 2012-07-29 NOTE — Assessment & Plan Note (Signed)
Minimally elevated today and for her age her control is fine. She has no proteinuria by exam in December. Renal function stable. Continue current medications.

## 2012-07-29 NOTE — Patient Instructions (Signed)
Your blood pressure is only mildly elevated  You can check it at your drugn store in a week or two (after resting for 5 minutes) and let me know how it is  Your heart is fine  Return in 3 months   Please get a TDaP vaccine this Spring

## 2012-07-29 NOTE — Progress Notes (Signed)
Patient ID: Maureen Morales, female   DOB: [birth year], 79 y.o.   MRN: [id number]  Patient Active Problem List  Diagnosis  . Hypertension  . Type II diabetes mellitus, well controlled  . Hyperlipidemia  . Nummular eczema  . Hemorrhoids, internal  . Asthma  . Aortic stenosis, mild    Subjective:  CC:   Chief Complaint  Patient presents with  . Follow-up    HPI:   Maureen SIGUENZA a 79 y.o. female who presents for three-month followup on diabetes mellitus, hypertension hyperlipidemia and heart murmur. She is grieving the loss of her younger brother who died over Christmas. She has lost 3 siblings in the past year. She has occasional insomnia but is able to fall back asleep with medication. She has occasional pain of a crampy nature in her right breast and is concerned this is due to her heart disease since she had a recent cardiac evaluation for heart murmur.  The chest pain is not brought on with exertion. Infection is quite physically active and open downstairs at her home without any shortness of breath. The pain occurs at rest.  She is following a low glycemic index/Mediterranean diet. She has no numbness or tingling in her feet. She has no myalgias with use of fenofibrate. No polyuria or nocturia. Tolerating  metformin without diarrhea.  She was evaluated by Dr. Kirke Corin in January for heart murmur. Mild aortic stenosis was found by echocardiogram and followup in one year was recommended.   Is worried about her blood pressure because the nurse told her I today. She did report eating olives and cheese this morning for breakfast    Past Medical History  Diagnosis Date  . Hypertension   . Diabetes mellitus   . Hyperlipidemia   . Viral gastroenteritis due to Norwalk-like agent Nov 2012    Past Surgical History  Procedure Laterality Date  . Bladder surgery    . Abdominal hysterectomy    . Cardiac catheterization         The following portions of the patient's history were  reviewed and updated as appropriate: Allergies, current medications, and problem list.    Review of Systems:   12 Pt  review of systems was negative except those addressed in the HPI,     History   Social History  . Marital Status: Single    Spouse Name: N/A    Number of Children: N/A  . Years of Education: N/A   Occupational History  . Not on file.   Social History Main Topics  . Smoking status: Never Smoker   . Smokeless tobacco: Never Used  . Alcohol Use: No  . Drug Use: No  . Sexually Active: Not on file   Other Topics Concern  . Not on file   Social History Narrative  . No narrative on file    Objective:  BP 146/68  Pulse 68  Temp(Src) 97.5 F (36.4 C) (Oral)  Resp 16  Wt 130 lb 12 oz (59.308 kg)  BMI 24.72 kg/m2  SpO2 94%  General appearance: alert, cooperative and appears stated age Ears: normal TM's and external ear canals both ears Throat: lips, mucosa, and tongue normal; teeth and gums normal Neck: no adenopathy, no carotid bruit, supple, symmetrical, trachea midline and thyroid not enlarged, symmetric, no tenderness/mass/nodules Back: symmetric, no curvature. ROM normal. No CVA tenderness. Lungs: clear to auscultation bilaterally Heart: regular rate and rhythm, S1, S2 normal, no murmur, click, rub or gallop Abdomen: soft, non-tender; bowel  sounds normal; no masses,  no organomegaly Pulses: 2+ and symmetric Skin: Skin color, texture, turgor normal. No rashes or lesions Lymph nodes: Cervical, supraclavicular, and axillary nodes normal. Foot exam:  Nails are well trimmed,  No callouses,  Sensation intact to microfilament  Assessment and Plan:  Type II diabetes mellitus, well controlled Well-controlled on current medications.  hemoglobin A1c has been consistently less than 7.0 . He is up-to-date on eye exams and her foot exam is norma.l we'll repeat his urine microalbumin to creatinine ratio at next visit. She is on the appropriate  medications.  Aortic stenosis, mild By recent echocardiogram and evaluation by Dr. Kirke Corin. Patient is asymptomatic. Repeat echo in one year  Hypertension Minimally elevated today and for her age her control is fine. She has no proteinuria by exam in December. Renal function stable. Continue current medications.    Updated Medication List Outpatient Encounter Prescriptions as of 07/29/2012  Medication Sig Dispense Refill  . acetaminophen (TYLENOL) 500 MG tablet Take 500 mg by mouth as needed.        [name] BIOTIN 5000 PO Take by mouth.        . calcium carbonate (TUMS) 500 MG chewable tablet Chew 1 tablet by mouth. Taken when needed      . cholecalciferol (VITAMIN D) 1000 UNITS tablet Take 1,000 Units by mouth daily.        [name] estrogens, conjugated, (PREMARIN) 0.3 MG tablet Take one tablet every day.  30 tablet  5  . fenofibrate 160 MG tablet Take 1 tablet (160 mg total) by mouth daily. For high triglycerides  30 tablet  5  . ibuprofen (ADVIL,MOTRIN) 200 MG tablet Take 200 mg by mouth as needed.        . loratadine (CLARITIN) 10 MG tablet Take 10 mg by mouth daily as needed.        . metFORMIN (GLUCOPHAGE-XR) 750 MG 24 hr tablet Take 1 tablet (750 mg total) by mouth daily with breakfast.  30 tablet  5  . metoprolol succinate (TOPROL-XL) 25 MG 24 hr tablet Take 1 tablet (25 mg total) by mouth daily.  30 tablet  1  . Multiple Vitamins-Minerals (ABC PLUS SENIOR PO) Take by mouth.        . Omega-3 Fatty Acids (FISH OIL) 1200 MG CAPS Take by mouth.        . Probiotic Product (PHILLIPS COLON HEALTH PO) Take 1 tablet by mouth daily.        . valsartan-hydrochlorothiazide (DIOVAN-HCT) 320-25 MG per tablet Take 1 tablet by mouth daily.  30 tablet  6  . albuterol (PROVENTIL HFA;VENTOLIN HFA) 108 (90 BASE) MCG/ACT inhaler Inhale 2 puffs into the lungs every 6 (six) hours as needed for wheezing.  1 Inhaler  11  . TDaP (BOOSTRIX) 5-2.5-18.5 LF-MCG/0.5 injection Inject 0.5 mLs into the muscle once.  0.5 mL  0    No facility-administered encounter medications on file as of 07/29/2012.     Orders Placed This Encounter  Procedures  . Hemoglobin A1c  . Comprehensive metabolic panel    No Follow-up on file.

## 2012-07-29 NOTE — Assessment & Plan Note (Signed)
By recent echocardiogram and evaluation by Dr. Kirke Corin. Patient is asymptomatic. Repeat echo in one year

## 2012-08-05 ENCOUNTER — Other Ambulatory Visit: Payer: Self-pay | Admitting: *Deleted

## 2012-08-05 MED ORDER — METFORMIN HCL ER 750 MG PO TB24: 750.0000 mg | ORAL_TABLET | Freq: Every day | ORAL | Status: DC

## 2012-08-05 NOTE — Telephone Encounter (Signed)
Med filled.  

## 2012-08-07 ENCOUNTER — Ambulatory Visit (INDEPENDENT_AMBULATORY_CARE_PROVIDER_SITE_OTHER): Payer: Medicare Other | Admitting: Internal Medicine

## 2012-08-07 ENCOUNTER — Telehealth: Payer: Self-pay | Admitting: Internal Medicine

## 2012-08-07 ENCOUNTER — Encounter: Payer: Self-pay | Admitting: Internal Medicine

## 2012-08-07 VITALS — BP 106/52 | HR 85 | Temp 98.1°F | Resp 14 | Wt 126.5 lb

## 2012-08-07 DIAGNOSIS — R079 Chest pain, unspecified: Secondary | ICD-10-CM | POA: Diagnosis not present

## 2012-08-07 DIAGNOSIS — R0789 Other chest pain: Secondary | ICD-10-CM | POA: Diagnosis not present

## 2012-08-07 MED ORDER — ALBUTEROL SULFATE HFA 108 (90 BASE) MCG/ACT IN AERS: 2.0000 | INHALATION_SPRAY | Freq: Four times a day (QID) | RESPIRATORY_TRACT | Status: DC | PRN

## 2012-08-07 NOTE — Progress Notes (Signed)
Patient ID: Maureen Morales, female   DOB: November 08, 1922, 79 y.o.   MRN: [id number]  Patient Active Problem List  Diagnosis  . Hypertension  . Type II diabetes mellitus, well controlled  . Hyperlipidemia  . Nummular eczema  . Hemorrhoids, internal  . Asthma  . Aortic stenosis, mild  . Feeling of chest tightness    Subjective:  CC:   Chief Complaint  Patient presents with  . Chest Pain    cough at night dry cough    HPI:   Maureen Morales a 79 y.o. female who presents with a  3 day history of vague chest pressure.  The feeling is center of chest, present at rest, not brought on by exercise.  No pleuritic component, diaphoresis or nausea.  Worried she has a respiratory infection. She has been taking sudafed at suggestion of daughter with no change.  No ear pain,  Headache or sinus congestion.  Some dry cough worse at night, thinks it might be asthma.   No pain with running up stairs.  Has been under some emotional stress with the very recent death of her husband's brother.   Past Medical History  Diagnosis Date  . Hypertension   . Diabetes mellitus   . Hyperlipidemia   . Viral gastroenteritis due to Norwalk-like agent Nov 2012    Past Surgical History  Procedure Laterality Date  . Bladder surgery    . Abdominal hysterectomy    . Cardiac catheterization         The following portions of the patient's history were reviewed and updated as appropriate: Allergies, current medications, and problem list.    Review of Systems:   Patient denies headache, fevers, malaise, unintentional weight loss, skin rash, eye pain, sinus congestion and sinus pain, sore throat, dysphagia,  hemoptysis ,  dyspnea, wheezing, palpitations, orthopnea, edema, abdominal pain, nausea, melena, diarrhea, constipation, flank pain, dysuria, hematuria, urinary  Frequency, nocturia, numbness, tingling, seizures,  Focal weakness, Loss of consciousness,  Tremor, insomnia, depression, anxiety, and suicidal  ideation.      History   Social History  . Marital Status: Single    Spouse Name: N/A    Number of Children: N/A  . Years of Education: N/A   Occupational History  . Not on file.   Social History Main Topics  . Smoking status: Never Smoker   . Smokeless tobacco: Never Used  . Alcohol Use: No  . Drug Use: No  . Sexually Active: Not on file   Other Topics Concern  . Not on file   Social History Narrative  . No narrative on file    Objective:  BP 106/52  Pulse 85  Temp(Src) 98.1 F (36.7 C) (Oral)  Resp 14  Wt 126 lb 8 oz (57.38 kg)  BMI 23.91 kg/m2  SpO2 99%  General appearance: alert, cooperative and appears stated age Ears: normal TM's and external ear canals both ears Throat: lips, mucosa, and tongue normal; teeth and gums normal Neck: no adenopathy, no carotid bruit, supple, symmetrical, trachea midline and thyroid not enlarged, symmetric, no tenderness/mass/nodules Back: symmetric, no curvature. ROM normal. No CVA tenderness. Lungs: clear to auscultation bilaterally. No wheezing  Heart: regular rate and rhythm, S1, S2 normal, systolic murmur, no click, rub or gallop Abdomen: soft, non-tender; bowel sounds normal; no masses,  no organomegaly Pulses: 2+ and symmetric Skin: Skin color, texture, turgor normal. No rashes or lesions Lymph nodes: Cervical, supraclavicular, and axillary nodes normal.  Assessment and Plan:  Feeling  of chest tightness Atypical,  persistent and present for the last 3 days.  EKG unchanged.  I have ordered cardiac enzymes .  Given her normal lung enzyme. Symptoms are likely due to anxiety and stress over her brother in law's recent death. Prn albuterol for dyspnea.  Instructed to call 911 if she develops chest pain .   Updated Medication List Outpatient Encounter Prescriptions as of 08/07/2012  Medication Sig Dispense Refill  . acetaminophen (TYLENOL) 500 MG tablet Take 500 mg by mouth as needed.        [name] BIOTIN 5000 PO Take by  mouth.        . calcium carbonate (TUMS) 500 MG chewable tablet Chew 1 tablet by mouth. Taken when needed      . cholecalciferol (VITAMIN D) 1000 UNITS tablet Take 1,000 Units by mouth daily.        [name] estrogens, conjugated, (PREMARIN) 0.3 MG tablet Take one tablet every day.  30 tablet  5  . fenofibrate 160 MG tablet Take 1 tablet (160 mg total) by mouth daily. For high triglycerides  30 tablet  5  . ibuprofen (ADVIL,MOTRIN) 200 MG tablet Take 200 mg by mouth as needed.        . loratadine (CLARITIN) 10 MG tablet Take 10 mg by mouth daily as needed.        . metFORMIN (GLUCOPHAGE-XR) 750 MG 24 hr tablet Take 1 tablet (750 mg total) by mouth daily with breakfast.  30 tablet  5  . metoprolol succinate (TOPROL-XL) 25 MG 24 hr tablet Take 1 tablet (25 mg total) by mouth daily.  30 tablet  1  . Multiple Vitamins-Minerals (ABC PLUS SENIOR PO) Take by mouth.        . Omega-3 Fatty Acids (FISH OIL) 1200 MG CAPS Take by mouth.        . Probiotic Product (PHILLIPS COLON HEALTH PO) Take 1 tablet by mouth daily.        . pseudoephedrine (SUDAFED) 30 MG tablet Take 30 mg by mouth every 6 (six) hours as needed for congestion.      . TDaP (BOOSTRIX) 5-2.5-18.5 LF-MCG/0.5 injection Inject 0.5 mLs into the muscle once.  0.5 mL  0  . valsartan-hydrochlorothiazide (DIOVAN-HCT) 320-25 MG per tablet Take 1 tablet by mouth daily.  30 tablet  6  . albuterol (PROVENTIL HFA;VENTOLIN HFA) 108 (90 BASE) MCG/ACT inhaler Inhale 2 puffs into the lungs every 6 (six) hours as needed for wheezing.  1 Inhaler  11  . [DISCONTINUED] albuterol (PROVENTIL HFA;VENTOLIN HFA) 108 (90 BASE) MCG/ACT inhaler Inhale 2 puffs into the lungs every 6 (six) hours as needed for wheezing.  1 Inhaler  11   No facility-administered encounter medications on file as of 08/07/2012.     Orders Placed This Encounter  Procedures  . CK Total (and CKMB)  . Troponin I  . EKG 12-Lead    No Follow-up on file.

## 2012-08-07 NOTE — Patient Instructions (Addendum)
Your lungs sound, fine ,  But if you feel short of breath or tight in your chest you can use the inhaler 2 puffs every 6 hours if needed I sent a new one to your pharmacy. If you develop chest pain, please call 911 so they can take you to the ER   I have run some additional tests on your heart today,  But your EKG is normal   You are not contagious,  You do not appear to have an infection  continue your allergy medication daily  If you feel anxious, and would like to take something to help calm your nerves, let me know

## 2012-08-07 NOTE — Assessment & Plan Note (Signed)
Atypical,  persistent and present for the last 3 days.  EKG unchanged.  I have ordered cardiac enzymes .  Given her normal lung enzyme. Symptoms are likely due to anxiety and stress over her brother in law's recent death. Prn albuterol for dyspnea.  Instructed to call 911 if she develops chest pain .

## 2012-08-07 NOTE — Telephone Encounter (Signed)
Patient Information:  Caller Name: Prisila  Phone: [phone]  Patient: Maureen, Morales  Gender: Female  DOB: 1923-04-06  Age: 79 Years  PCP: Duncan Dull (Adults only)   Does the office need to follow up with this patient?: No  RN Note:  short of breath, requests appt   Reason For Call & Symptoms: coughing at night, using Sudafed during the day  Reviewed Health History In EMR: Yes  Reviewed Medications In EMR: Yes  Reviewed Allergies In EMR: Yes  Reviewed Surgeries / Procedures: Yes  Date of Onset of Symptoms: 08/05/2012  Guideline(s) Used:  Cough  Disposition Per Guideline:  See Today or Tomorrow in Office  Reason For Disposition Reached:  Patient wants to be seen  Advice Given:  Call Back If:  Difficulty breathing;  You become worse.  Patient Will Follow Care Advice: Verbalizes understanding    Appointment Scheduled:  08/07/2012 11:30:00;  Provider:  Orville Govern

## 2012-08-08 LAB — TROPONIN I: Troponin I: 0.01 ng/mL (ref <0.06)

## 2012-08-08 LAB — CK TOTAL AND CKMB (NOT AT ARMC)
CK, MB: 6.9 ng/mL — ABNORMAL HIGH (ref 0.3–4.0)
Relative Index: 4.1 — ABNORMAL HIGH (ref 0.0–2.5)

## 2012-08-19 ENCOUNTER — Encounter: Payer: Self-pay | Admitting: *Deleted

## 2012-08-19 ENCOUNTER — Other Ambulatory Visit: Payer: Self-pay | Admitting: *Deleted

## 2012-08-19 MED ORDER — VALSARTAN-HYDROCHLOROTHIAZIDE 320-25 MG PO TABS: 1.0000 | ORAL_TABLET | Freq: Every day | ORAL | Status: DC

## 2012-08-26 ENCOUNTER — Ambulatory Visit (INDEPENDENT_AMBULATORY_CARE_PROVIDER_SITE_OTHER): Payer: Medicare Other | Admitting: Internal Medicine

## 2012-08-26 ENCOUNTER — Encounter: Payer: Self-pay | Admitting: Internal Medicine

## 2012-08-26 ENCOUNTER — Telehealth: Payer: Self-pay | Admitting: Internal Medicine

## 2012-08-26 VITALS — BP 152/72 | HR 75 | Temp 97.8°F | Resp 14 | Wt 127.5 lb

## 2012-08-26 DIAGNOSIS — N390 Urinary tract infection, site not specified: Secondary | ICD-10-CM

## 2012-08-26 DIAGNOSIS — M546 Pain in thoracic spine: Secondary | ICD-10-CM

## 2012-08-26 LAB — POCT URINALYSIS DIPSTICK
Glucose, UA: NEGATIVE
Nitrite, UA: NEGATIVE
Protein, UA: NEGATIVE
Urobilinogen, UA: 0.2

## 2012-08-26 MED ORDER — CIPROFLOXACIN HCL 250 MG PO TABS: 250.0000 mg | ORAL_TABLET | Freq: Two times a day (BID) | ORAL | Status: DC

## 2012-08-26 MED ORDER — BUTALBITAL-APAP-CAFFEINE 50-325-40 MG PO TABS
1.0000 | ORAL_TABLET | Freq: Four times a day (QID) | ORAL | Status: DC | PRN
Start: 2012-08-26 — End: 2013-08-02

## 2012-08-26 NOTE — Telephone Encounter (Signed)
Patient wanting an appointment today . Having back pain wanting something to help relax her back. She also asked for a referral to an Orthopedic specialist.

## 2012-08-26 NOTE — Telephone Encounter (Signed)
Pt notified, appt schedule.

## 2012-08-26 NOTE — Telephone Encounter (Signed)
Please put  Ms Newkirk in the slot on hold at 3:30 (after [employer])

## 2012-08-26 NOTE — Patient Instructions (Addendum)
I am prescribing ciprofloxacin twice daily for your urinary tract infection   I have prescribed Butalbital for your pain.  It does not have aspirin or ibuprofen in it.   Try a heating pad on the right side  If no improvement  in 48 hours get the x rays done at [facility]

## 2012-08-26 NOTE — Telephone Encounter (Signed)
Please advise schedule full for next two days .

## 2012-08-26 NOTE — Progress Notes (Signed)
Patient ID: Maureen Morales, female   DOB: [birth year], 79 y.o.   MRN: [id number]   Patient Active Problem List   Diagnosis Date Noted  . Back pain, thoracic 08/27/2012  . UTI (urinary tract infection) 08/27/2012  . Feeling of chest tightness 08/07/2012  . Aortic stenosis, mild 05/06/2012  . Asthma 07/04/2011  . Hemorrhoids, internal 04/05/2011  . Nummular eczema 01/16/2011  . Hypertension   . Type II diabetes mellitus, well controlled   . Hyperlipidemia     Subjective:  CC:   Chief Complaint  Patient presents with  . Acute Visit    HPI:   Maureen Morales a 79 y.o. female who presents Worked in urgently for evaluation of right sided lower thoracic back pain for two weeks ,  Hurts to stand up,  Has been taking ibuprofen for 4 days,  Stomach is now a little upset.  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  Denies heavy lifting,  But a lot of rolling of dough.  No pain radiating to buttocks.  No urinary symptoms but UA suspicious for UTI.    Past Medical History  Diagnosis Date  . Hypertension   . Diabetes mellitus   . Hyperlipidemia   . Viral gastroenteritis due to Norwalk-like agent Nov 2012    Past Surgical History  Procedure Laterality Date  . Bladder surgery    . Abdominal hysterectomy    . Cardiac catheterization         The following portions of the patient's history were reviewed and updated as appropriate: Allergies, current medications, and problem list.    Review of Systems:   Patient denies headache, fevers, malaise, unintentional weight loss, skin rash, eye pain, sinus congestion and sinus pain, sore throat, dysphagia,  hemoptysis , cough, dyspnea, wheezing, chest pain, palpitations, orthopnea, edema, melena, diarrhea, constipation, flank pain, dysuria, hematuria, urinary  Frequency, nocturia, numbness, tingling, seizures,  Focal weakness, Loss of  consciousness,  Tremor, insomnia, depression, anxiety, and suicidal ideation.     History   Social History  . Marital Status: Single    Spouse Name: N/A    Number of Children: N/A  . Years of Education: N/A   Occupational History  . Not on file.   Social History Main Topics  . Smoking status: Never Smoker   . Smokeless tobacco: Never Used  . Alcohol Use: No  . Drug Use: No  . Sexually Active: Not on file   Other Topics Concern  . Not on file   Social History Narrative  . No narrative on file    Objective:  BP 152/72  Pulse 75  Temp(Src) 97.8 F (36.6 C) (Oral)  Resp 14  Wt 127 lb 8 oz (57.834 kg)  BMI 24.1 kg/m2  SpO2 98%  General appearance: alert, cooperative and appears stated age Ears: normal TM's and external ear canals both ears Throat: lips, mucosa, and tongue normal; teeth and gums normal Neck: no adenopathy, no carotid bruit, supple, symmetrical, trachea midline and thyroid not enlarged, symmetric, no tenderness/mass/nodules Back: symmetric, no curvature. ROM normal. No CVA tenderness. Lungs: clear to auscultation bilaterally Heart: regular rate and rhythm, S1, S2 normal, no murmur, click, rub or gallop Abdomen: soft, non-tender; bowel sounds normal; no masses,  no organomegaly Pulses: 2+ and symmetric Skin: Skin color, texture, turgor normal. No rashes or lesions MSK: paraspinous muscle spasm on right,  No  vertebral tenderness,  Negative straight leg lift.  Lymph nodes: Cervical, supraclavicular, and axillary nodes normal.  Assessment and Plan:  Back pain, thoracic She has paraspinous muscle spasm on right,  No vertebral tenderness.  Intolerance to codeine,  And ibuprofen causing stomach upset.  Fioricet trial,  Treat UTI and obtain spine films.  Rejectes suggestion for massage. .  Try heating pad.   UTI (urinary tract infection) Asymptomatic except for right sided back pain which may be unrelated,  But given her age will treat empirically for  uncomplicated pyelo with Cipro x 7 days pending urine culture    Updated Medication List Outpatient Encounter Prescriptions as of 08/26/2012  Medication Sig Dispense Refill  . acetaminophen (TYLENOL) 500 MG tablet Take 500 mg by mouth as needed.        [name] albuterol (PROVENTIL HFA;VENTOLIN HFA) 108 (90 BASE) MCG/ACT inhaler Inhale 2 puffs into the lungs every 6 (six) hours as needed for wheezing.  1 Inhaler  11  . BIOTIN 5000 PO Take by mouth.        . butalbital-acetaminophen-caffeine (FIORICET) 50-325-40 MG per tablet Take 1-2 tablets by mouth every 6 (six) hours as needed for headache.  20 tablet  1  . calcium carbonate (TUMS) 500 MG chewable tablet Chew 1 tablet by mouth. Taken when needed      . cholecalciferol (VITAMIN D) 1000 UNITS tablet Take 1,000 Units by mouth daily.        . ciprofloxacin (CIPRO) 250 MG tablet Take 1 tablet (250 mg total) by mouth 2 (two) times daily.  14 tablet  0  . estrogens, conjugated, (PREMARIN) 0.3 MG tablet Take one tablet every day.  30 tablet  5  . fenofibrate 160 MG tablet Take 1 tablet (160 mg total) by mouth daily. For high triglycerides  30 tablet  5  . ibuprofen (ADVIL,MOTRIN) 200 MG tablet Take 200 mg by mouth as needed.        . loratadine (CLARITIN) 10 MG tablet Take 10 mg by mouth daily as needed.        . metFORMIN (GLUCOPHAGE-XR) 750 MG 24 hr tablet Take 1 tablet (750 mg total) by mouth daily with breakfast.  30 tablet  5  . metoprolol succinate (TOPROL-XL) 25 MG 24 hr tablet Take 1 tablet (25 mg total) by mouth daily.  30 tablet  1  . Multiple Vitamins-Minerals (ABC PLUS SENIOR PO) Take by mouth.        . Omega-3 Fatty Acids (FISH OIL) 1200 MG CAPS Take by mouth.        . Probiotic Product (PHILLIPS COLON HEALTH PO) Take 1 tablet by mouth daily.        . pseudoephedrine (SUDAFED) 30 MG tablet Take 30 mg by mouth every 6 (six) hours as needed for congestion.      . TDaP (BOOSTRIX) 5-2.5-18.5 LF-MCG/0.5 injection Inject 0.5 mLs into the muscle  once.  0.5 mL  0  . valsartan-hydrochlorothiazide (DIOVAN-HCT) 320-25 MG per tablet Take 1 tablet by mouth daily.  30 tablet  6   No facility-administered encounter medications on file as of 08/26/2012.     Orders Placed This Encounter  Procedures  . Urine culture  . DG Thoracic Spine W/Swimmers  . POCT urinalysis dipstick    No Follow-up on file.

## 2012-08-27 ENCOUNTER — Ambulatory Visit (INDEPENDENT_AMBULATORY_CARE_PROVIDER_SITE_OTHER)
Admission: RE | Admit: 2012-08-27 | Discharge: 2012-08-27 | Disposition: A | Discharge status: Home / Self Care | Payer: Medicare Other | Source: Ambulatory Visit | Attending: Internal Medicine | Admitting: Internal Medicine

## 2012-08-27 ENCOUNTER — Encounter: Payer: Self-pay | Admitting: Internal Medicine

## 2012-08-27 ENCOUNTER — Other Ambulatory Visit: Payer: Self-pay | Admitting: *Deleted

## 2012-08-27 DIAGNOSIS — M47814 Spondylosis without myelopathy or radiculopathy, thoracic region: Secondary | ICD-10-CM | POA: Diagnosis not present

## 2012-08-27 DIAGNOSIS — M546 Pain in thoracic spine: Secondary | ICD-10-CM | POA: Diagnosis not present

## 2012-08-27 DIAGNOSIS — N390 Urinary tract infection, site not specified: Secondary | ICD-10-CM | POA: Insufficient documentation

## 2012-08-27 NOTE — Assessment & Plan Note (Signed)
Asymptomatic except for right sided back pain which may be unrelated,  But given her age will treat empirically for uncomplicated pyelo with Cipro x 7 days pending urine culture

## 2012-08-27 NOTE — Assessment & Plan Note (Signed)
She has paraspinous muscle spasm on right,  No vertebral tenderness.  Intolerance to codeine,  And ibuprofen causing stomach upset.  Fioricet trial,  Treat UTI and obtain spine films.  Rejectes suggestion for massage. .  Try heating pad.

## 2012-08-28 ENCOUNTER — Other Ambulatory Visit: Payer: Self-pay | Admitting: Internal Medicine

## 2012-08-28 DIAGNOSIS — S39012S Strain of muscle, fascia and tendon of lower back, sequela: Secondary | ICD-10-CM

## 2012-08-28 LAB — URINE CULTURE
Colony Count: NO GROWTH
Organism ID, Bacteria: NO GROWTH

## 2012-08-28 MED ORDER — PREDNISONE 20 MG PO TABS: 40.0000 mg | ORAL_TABLET | Freq: Every day | ORAL | Status: DC

## 2012-10-04 DIAGNOSIS — H2510 Age-related nuclear cataract, unspecified eye: Secondary | ICD-10-CM | POA: Diagnosis not present

## 2012-10-09 ENCOUNTER — Encounter: Payer: Self-pay | Admitting: Internal Medicine

## 2012-10-09 ENCOUNTER — Ambulatory Visit (INDEPENDENT_AMBULATORY_CARE_PROVIDER_SITE_OTHER): Payer: Medicare Other | Admitting: Internal Medicine

## 2012-10-09 VITALS — BP 126/58 | HR 74 | Temp 98.2°F | Resp 16 | Wt 127.5 lb

## 2012-10-09 DIAGNOSIS — E119 Type 2 diabetes mellitus without complications: Secondary | ICD-10-CM | POA: Diagnosis not present

## 2012-10-09 DIAGNOSIS — I10 Essential (primary) hypertension: Secondary | ICD-10-CM | POA: Diagnosis not present

## 2012-10-09 DIAGNOSIS — M546 Pain in thoracic spine: Secondary | ICD-10-CM

## 2012-10-09 NOTE — Patient Instructions (Addendum)
You do not need your diabetes labs done until July.  You can return then just for fasting labs (including cholesterol)  The [facility] Screening is a great service and well worth the price   We will check to se eif you have had the TDaP vaccine  I will see you again in 3 months   Your back pain is due to age related arthritis

## 2012-10-09 NOTE — Assessment & Plan Note (Addendum)
Still having occasional back pain brought on with prolonged stooping and inactivity. Reassurance provided

## 2012-10-09 NOTE — Progress Notes (Signed)
Patient ID: Maureen Morales, female   DOB: [birth year], 79 y.o.   MRN: [id number]   Patient Active Problem List   Diagnosis Date Noted  . Back pain, thoracic 08/27/2012  . Feeling of chest tightness 08/07/2012  . Aortic stenosis, mild 05/06/2012  . Asthma 07/04/2011  . Hemorrhoids, internal 04/05/2011  . Nummular eczema 01/16/2011  . Hypertension   . Type II diabetes mellitus, well controlled   . Hyperlipidemia     Subjective:  CC:   Chief Complaint  Patient presents with  . Follow-up    3 month    HPI:   Maureen Morales a 79 y.o. female who presents for 3 month follow up on Diabetes and other chronic conditions   She is doing well , has no acute issues. She has had no hypoglycemic events. She checks her bloos sugars randomly , both fasting and post prandially, and her fastings cbgs have been < 125 and post prandial cbgs have been < 150.  She continues to have mild  Occasional nonradiating  lower back pain aggravated by rest and prolonged stooping over her kitchen counter.    Past Medical History  Diagnosis Date  . Hypertension   . Diabetes mellitus   . Hyperlipidemia   . Viral gastroenteritis due to Norwalk-like agent Nov 2012    Past Surgical History  Procedure Laterality Date  . Bladder surgery    . Abdominal hysterectomy    . Cardiac catheterization         The following portions of the patient's history were reviewed and updated as appropriate: Allergies, current medications, and problem list.    Review of Systems:   12 Pt  review of systems was negative except those addressed in the HPI,     History   Social History  . Marital Status: Single    Spouse Name: N/A    Number of Children: N/A  . Years of Education: N/A   Occupational History  . Not on file.   Social History Main Topics  . Smoking status: Never Smoker   . Smokeless tobacco: Never Used  . Alcohol Use: No  . Drug Use: No  . Sexually Active: Not on file   Other Topics Concern  .  Not on file   Social History Narrative  . No narrative on file    Objective:  BP 126/58  Pulse 74  Temp(Src) 98.2 F (36.8 C) (Oral)  Resp 16  Wt 127 lb 8 oz (57.834 kg)  BMI 24.1 kg/m2  SpO2 99%  General appearance: alert, cooperative and appears younger than stated age Ears: normal TM's and external ear canals both ears Throat: lips, mucosa, and tongue normal; teeth and gums normal Neck: no adenopathy, no carotid bruit, supple, symmetrical, trachea midline and thyroid not enlarged, symmetric, no tenderness/mass/nodules Back: symmetric, no curvature. ROM normal. No CVA tenderness. Lungs: clear to auscultation bilaterally Heart: regular rate and rhythm, S1, S2 normal, no murmur, click, rub or gallop Abdomen: soft, non-tender; bowel sounds normal; no masses,  no organomegaly Pulses: 2+ and symmetric Skin: Skin color, texture, turgor normal. No rashes or lesions Lymph nodes: Cervical, supraclavicular, and axillary nodes normal. Foot exam:  Nails are well trimmed,  No callouses,  Sensation intact to microfilament  Assessment and Plan:  Back pain, thoracic Still having occasional back pain brought on with prolonged stooping and inactivity. Reassurance provided  Hypertension Well controlled on current regimen. Renal function stable, no changes today.  Type II diabetes mellitus, well controlled  Well-controlled on current medications.  hemoglobin A1c has been consistently less than 7.0 .She is up-to-date on eye exams and her foot exam is normal. we'll repeat his urine microalbumin to creatinine ratio at next visit. She is on the appropriate medications.  A total of 25 minutes of face to face time was spent with patient more than half of which was spent in counselling and coordination of care .  Updated Medication List Outpatient Encounter Prescriptions as of 10/09/2012  Medication Sig Dispense Refill  . acetaminophen (TYLENOL) 500 MG tablet Take 500 mg by mouth as needed.         [name] albuterol (PROVENTIL HFA;VENTOLIN HFA) 108 (90 BASE) MCG/ACT inhaler Inhale 2 puffs into the lungs every 6 (six) hours as needed for wheezing.  1 Inhaler  11  . BIOTIN 5000 PO Take by mouth.        . calcium carbonate (TUMS) 500 MG chewable tablet Chew 1 tablet by mouth. Taken when needed      . cholecalciferol (VITAMIN D) 1000 UNITS tablet Take 1,000 Units by mouth daily.        [name] estrogens, conjugated, (PREMARIN) 0.3 MG tablet Take one tablet every day.  30 tablet  5  . fenofibrate 160 MG tablet Take 1 tablet (160 mg total) by mouth daily. For high triglycerides  30 tablet  5  . ibuprofen (ADVIL,MOTRIN) 200 MG tablet Take 200 mg by mouth as needed.        . loratadine (CLARITIN) 10 MG tablet Take 10 mg by mouth daily as needed.        . metFORMIN (GLUCOPHAGE-XR) 750 MG 24 hr tablet Take 1 tablet (750 mg total) by mouth daily with breakfast.  30 tablet  5  . metoprolol succinate (TOPROL-XL) 25 MG 24 hr tablet Take 1 tablet (25 mg total) by mouth daily.  30 tablet  1  . Multiple Vitamins-Minerals (ABC PLUS SENIOR PO) Take by mouth.        . Omega-3 Fatty Acids (FISH OIL) 1200 MG CAPS Take by mouth.        . Probiotic Product (PHILLIPS COLON HEALTH PO) Take 1 tablet by mouth daily.        . TDaP (BOOSTRIX) 5-2.5-18.5 LF-MCG/0.5 injection Inject 0.5 mLs into the muscle once.  0.5 mL  0  . valsartan-hydrochlorothiazide (DIOVAN-HCT) 320-25 MG per tablet Take 1 tablet by mouth daily.  30 tablet  6  . butalbital-acetaminophen-caffeine (FIORICET) 50-325-40 MG per tablet Take 1-2 tablets by mouth every 6 (six) hours as needed for headache.  20 tablet  1  . ciprofloxacin (CIPRO) 250 MG tablet Take 1 tablet (250 mg total) by mouth 2 (two) times daily.  14 tablet  0  . predniSONE (DELTASONE) 20 MG tablet Take 2 tablets (40 mg total) by mouth daily. For 5 days  10 tablet  0  . pseudoephedrine (SUDAFED) 30 MG tablet Take 30 mg by mouth every 6 (six) hours as needed for congestion.       No  facility-administered encounter medications on file as of 10/09/2012.     Orders Placed This Encounter  Procedures  . HM DIABETES EYE EXAM    Return in about 3 months (around 01/09/2013).

## 2012-10-10 NOTE — Assessment & Plan Note (Signed)
Well controlled on current regimen. Renal function stable, no changes today. 

## 2012-10-10 NOTE — Assessment & Plan Note (Signed)
Well-controlled on current medications.  hemoglobin A1c has been consistently less than 7.0 .She is up-to-date on eye exams and her foot exam is normal. we'll repeat his urine microalbumin to creatinine ratio at next visit. She is on the appropriate medications.

## 2012-11-04 ENCOUNTER — Telehealth: Payer: Self-pay | Admitting: *Deleted

## 2012-11-04 NOTE — Telephone Encounter (Signed)
Pt is coming in for labs tomorrow 07.07.2014 what labs and dx?

## 2012-11-05 ENCOUNTER — Other Ambulatory Visit (INDEPENDENT_AMBULATORY_CARE_PROVIDER_SITE_OTHER): Payer: Medicare Other

## 2012-11-05 ENCOUNTER — Telehealth: Payer: Self-pay | Admitting: *Deleted

## 2012-11-05 DIAGNOSIS — E119 Type 2 diabetes mellitus without complications: Secondary | ICD-10-CM

## 2012-11-05 LAB — COMPREHENSIVE METABOLIC PANEL
ALT: 27 U/L (ref 0–35)
Alkaline Phosphatase: 41 U/L (ref 39–117)
Glucose, Bld: 132 mg/dL — ABNORMAL HIGH (ref 70–99)
Sodium: 140 mEq/L (ref 135–145)
Total Bilirubin: 0.5 mg/dL (ref 0.3–1.2)
Total Protein: 7 g/dL (ref 6.0–8.3)

## 2012-11-05 LAB — LIPID PANEL
HDL: 53.4 mg/dL (ref >39.00)
LDL Cholesterol: 76 mg/dL (ref 0–99)
Total CHOL/HDL Ratio: 3
VLDL: 17 mg/dL (ref 0.0–40.0)

## 2012-11-05 LAB — HEMOGLOBIN A1C: Hgb A1c MFr Bld: 6.8 % — ABNORMAL HIGH (ref 4.6–6.5)

## 2012-11-05 NOTE — Telephone Encounter (Signed)
What labs and dx?  

## 2012-11-06 ENCOUNTER — Encounter: Payer: Self-pay | Admitting: *Deleted

## 2012-11-18 ENCOUNTER — Other Ambulatory Visit: Payer: Self-pay | Admitting: *Deleted

## 2012-11-18 MED ORDER — ESTROGENS CONJUGATED 0.3 MG PO TABS: ORAL_TABLET | ORAL | Status: DC

## 2013-01-06 ENCOUNTER — Other Ambulatory Visit: Payer: Self-pay | Admitting: *Deleted

## 2013-01-07 ENCOUNTER — Encounter: Payer: Self-pay | Admitting: *Deleted

## 2013-01-08 ENCOUNTER — Encounter: Payer: Self-pay | Admitting: Internal Medicine

## 2013-01-08 ENCOUNTER — Other Ambulatory Visit: Payer: Self-pay | Admitting: *Deleted

## 2013-01-08 ENCOUNTER — Ambulatory Visit (INDEPENDENT_AMBULATORY_CARE_PROVIDER_SITE_OTHER): Payer: Medicare Other | Admitting: Internal Medicine

## 2013-01-08 ENCOUNTER — Ambulatory Visit (INDEPENDENT_AMBULATORY_CARE_PROVIDER_SITE_OTHER)
Admission: RE | Admit: 2013-01-08 | Discharge: 2013-01-08 | Disposition: A | Discharge status: Home / Self Care | Payer: Medicare Other | Source: Ambulatory Visit | Attending: Internal Medicine | Admitting: Internal Medicine

## 2013-01-08 VITALS — BP 118/62 | HR 73 | Temp 98.1°F | Resp 12 | Ht 61.0 in | Wt 127.3 lb

## 2013-01-08 DIAGNOSIS — M25569 Pain in unspecified knee: Secondary | ICD-10-CM | POA: Diagnosis not present

## 2013-01-08 DIAGNOSIS — E785 Hyperlipidemia, unspecified: Secondary | ICD-10-CM | POA: Diagnosis not present

## 2013-01-08 DIAGNOSIS — E119 Type 2 diabetes mellitus without complications: Secondary | ICD-10-CM | POA: Diagnosis not present

## 2013-01-08 DIAGNOSIS — M1120 Other chondrocalcinosis, unspecified site: Secondary | ICD-10-CM | POA: Diagnosis not present

## 2013-01-08 DIAGNOSIS — M25561 Pain in right knee: Secondary | ICD-10-CM

## 2013-01-08 DIAGNOSIS — Z23 Encounter for immunization: Secondary | ICD-10-CM

## 2013-01-08 DIAGNOSIS — D649 Anemia, unspecified: Secondary | ICD-10-CM

## 2013-01-08 LAB — CBC WITH DIFFERENTIAL/PLATELET
Basophils Absolute: 0 10*3/uL (ref 0.0–0.1)
Eosinophils Absolute: 0.2 10*3/uL (ref 0.0–0.7)
MCHC: 34.6 g/dL (ref 30.0–36.0)
MCV: 86.6 fl (ref 78.0–100.0)
Monocytes Absolute: 0.8 10*3/uL (ref 0.1–1.0)
Neutrophils Relative %: 76.3 % (ref 43.0–77.0)
Platelets: 222 10*3/uL (ref 150.0–400.0)
RDW: 13 % (ref 11.5–14.6)
WBC: 10.2 10*3/uL (ref 4.5–10.5)

## 2013-01-08 LAB — COMPREHENSIVE METABOLIC PANEL
Albumin: 4 g/dL (ref 3.5–5.2)
CO2: 26 mEq/L (ref 19–32)
Calcium: 9.8 mg/dL (ref 8.4–10.5)
GFR: 44.65 mL/min — ABNORMAL LOW (ref >60.00)
Glucose, Bld: 145 mg/dL — ABNORMAL HIGH (ref 70–99)
Sodium: 138 mEq/L (ref 135–145)
Total Bilirubin: 0.7 mg/dL (ref 0.3–1.2)
Total Protein: 6.6 g/dL (ref 6.0–8.3)

## 2013-01-08 LAB — LIPID PANEL
HDL: 43.1 mg/dL (ref >39.00)
VLDL: 19.4 mg/dL (ref 0.0–40.0)

## 2013-01-08 LAB — HM DIABETES FOOT EXAM: HM Diabetic Foot Exam: NORMAL

## 2013-01-08 LAB — SEDIMENTATION RATE: Sed Rate: 15 mm/hr (ref 0–22)

## 2013-01-08 LAB — HEMOGLOBIN A1C: Hgb A1c MFr Bld: 6.6 % — ABNORMAL HIGH (ref 4.6–6.5)

## 2013-01-08 MED ORDER — PREDNISONE (PAK) 10 MG PO TABS: ORAL_TABLET | ORAL | Status: DC

## 2013-01-08 MED ORDER — FENOFIBRATE 160 MG PO TABS: 160.0000 mg | ORAL_TABLET | Freq: Every day | ORAL | Status: DC

## 2013-01-08 NOTE — Progress Notes (Addendum)
Patient ID: Maureen Morales, female   DOB: 1922/06/06, 79 y.o.   MRN: [id number]   Patient Active Problem List   Diagnosis Date Noted  . Right medial knee pain 01/09/2013  . Back pain, thoracic 08/27/2012  . Feeling of chest tightness 08/07/2012  . Aortic stenosis, mild 05/06/2012  . Asthma 07/04/2011  . Hemorrhoids, internal 04/05/2011  . Nummular eczema 01/16/2011  . Hypertension   . Type II diabetes mellitus, well controlled   . Hyperlipidemia     Subjective:  CC:   Chief Complaint  Patient presents with  . Follow-up    Right Knee , pain  patient reports falling last week, Right Knee hurtting X 5 days.    HPI:   Maureen Morales a 79 y.o. female who presents with left arm and righ knee pain after a recent fall.   Past Medical History  Diagnosis Date  . Hypertension   . Diabetes mellitus   . Hyperlipidemia   . Viral gastroenteritis due to Norwalk-like agent Nov 2012    Past Surgical History  Procedure Laterality Date  . Bladder surgery    . Abdominal hysterectomy    . Cardiac catheterization         The following portions of the patient's history were reviewed and updated as appropriate: Allergies, current medications, and problem list.    Review of Systems:   12 Pt  review of systems was negative except those addressed in the HPI,     History   Social History  . Marital Status: Single    Spouse Name: N/A    Number of Children: N/A  . Years of Education: N/A   Occupational History  . Not on file.   Social History Main Topics  . Smoking status: Never Smoker   . Smokeless tobacco: Never Used  . Alcohol Use: No  . Drug Use: No  . Sexual Activity: Not on file   Other Topics Concern  . Not on file   Social History Narrative  . No narrative on file    Objective:  Filed Vitals:   01/08/13 0818  BP: 118/62  Pulse: 73  Temp: 98.1 F (36.7 C)  Resp: 12     General appearance: alert, cooperative and appears stated age Ears: normal  TM's and external ear canals both ears Throat: lips, mucosa, and tongue normal; teeth and gums normal Neck: no adenopathy, no carotid bruit, supple, symmetrical, trachea midline and thyroid not enlarged, symmetric, no tenderness/mass/nodules Back: symmetric, no curvature. ROM normal. No CVA tenderness. Lungs: clear to auscultation bilaterally Heart: regular rate and rhythm, S1, S2 normal, no murmur, click, rub or gallop Abdomen: soft, non-tender; bowel sounds normal; no masses,  no organomegaly Pulses: 2+ and symmetric Skin: Skin color, texture, turgor normal. No rashes or lesions. Left upper arm triceps bruise Ly, ismph nodes: Cervical, supraclavicular, and axillary nodes normal. MSK :  Point tenderness over medial patella right knee. Resolving bruise  adjacent to point tenderness Foot exam:  Nails are well trimmed,  No callouses,  Sensation intact to microfilament  Assessment and Plan:  Type II diabetes mellitus, well controlled Well-controlled on current medications.  hemoglobin A1c has been consistently less than 7.0 . Patient is  up-to-date on annual dilated retinal eye exam,  foot exam is normal today and there is no proteinuria.  I will  Repeat the urine microalbumin to creatinine ratio at next visit. Patient is is on the appropriate medications. Lab Results  Component Value Date  HGBA1C 6.6* 01/08/2013    Right medial knee pain Acute with no memory of trauma. However exam suggests otherwise since she does have a bruise in that area. Plain films were normal today. She has no signs of septic joint or inflammatory arthritis by labs done today. Trial of prednisone taper and ice pack every 8 hours. Crutches were offered but deferred by patient.   Hyperlipidemia LDL and triglycerides are at goal on current medications. He has no side effects and liver enzymes are normal. No changes today. Lab Results  Component Value Date   CHOL 135 01/08/2013   HDL 43.10 01/08/2013   LDLCALC 73  01/08/2013   LDLDIRECT 150.9 05/26/2008   TRIG 97.0 01/08/2013   CHOLHDL 3 01/08/2013     A total of 40 minutes was spent with patient more than half of which was spent in counseling, reviewing records from other prviders and coordination of care.  Updated Medication List Outpatient Encounter Prescriptions as of 01/08/2013  Medication Sig Dispense Refill  . acetaminophen (TYLENOL) 500 MG tablet Take 500 mg by mouth as needed.        [name] albuterol (PROVENTIL HFA;VENTOLIN HFA) 108 (90 BASE) MCG/ACT inhaler Inhale 2 puffs into the lungs every 6 (six) hours as needed for wheezing.  1 Inhaler  11  . BIOTIN 5000 PO Take by mouth.        . butalbital-acetaminophen-caffeine (FIORICET) 50-325-40 MG per tablet Take 1-2 tablets by mouth every 6 (six) hours as needed for headache.  20 tablet  1  . Calcium-Mag-Vit C-Vit D 185-28-2.5-200 CAPS Take 2 tablets by mouth daily.      . cholecalciferol (VITAMIN D) 1000 UNITS tablet Take 1,000 Units by mouth daily.        . ciprofloxacin (CIPRO) 250 MG tablet Take 1 tablet (250 mg total) by mouth 2 (two) times daily.  14 tablet  0  . estrogens, conjugated, (PREMARIN) 0.3 MG tablet Take one tablet every day.  30 tablet  11  . ibuprofen (ADVIL,MOTRIN) 200 MG tablet Take 200 mg by mouth as needed.        . loratadine (CLARITIN) 10 MG tablet Take 10 mg by mouth daily as needed.        . metFORMIN (GLUCOPHAGE-XR) 750 MG 24 hr tablet Take 1 tablet (750 mg total) by mouth daily with breakfast.  30 tablet  5  . metoprolol succinate (TOPROL-XL) 25 MG 24 hr tablet Take 1 tablet (25 mg total) by mouth daily.  30 tablet  1  . Multiple Vitamins-Minerals (ABC PLUS SENIOR PO) Take by mouth.        . Omega-3 Fatty Acids (FISH OIL) 1200 MG CAPS Take by mouth.        . Probiotic Product (PHILLIPS COLON HEALTH PO) Take 1 tablet by mouth daily.        . pseudoephedrine (SUDAFED) 30 MG tablet Take 30 mg by mouth every 6 (six) hours as needed for congestion.      . TDaP (BOOSTRIX)  5-2.5-18.5 LF-MCG/0.5 injection Inject 0.5 mLs into the muscle once.  0.5 mL  0  . valsartan-hydrochlorothiazide (DIOVAN-HCT) 320-25 MG per tablet Take 1 tablet by mouth daily.  30 tablet  6  . [DISCONTINUED] fenofibrate 160 MG tablet Take 1 tablet (160 mg total) by mouth daily. For high triglycerides  30 tablet  5  . [DISCONTINUED] predniSONE (DELTASONE) 20 MG tablet Take 2 tablets (40 mg total) by mouth daily. For 5 days  10 tablet  0  .  calcium carbonate (TUMS) 500 MG chewable tablet Chew 1 tablet by mouth. Taken when needed      . predniSONE (STERAPRED UNI-PAK) 10 MG tablet 6 tablets on Day 1 , then reduce by 1 tablet daily until gone  21 tablet  0   No facility-administered encounter medications on file as of 01/08/2013.

## 2013-01-08 NOTE — Patient Instructions (Addendum)
It appears that you have strained a ligament on the inside of your right knee  I am treating you with prednisone taper  For 6 days (instead of aleve and motrin)  You can use tylenol 500 mg every 6 hours,  And ice the spot for 15 mintues a couple of times daily  bloodwork today,  X ray if no better in 3 days  (you can walk  In to [facility] with the order form any time you want )     Wear off of premarin by taking it every other day for 2 weeks,  Then every 2 days for 2 weeks and stay at this dose until you see me

## 2013-01-08 NOTE — Telephone Encounter (Signed)
Eprescribed.

## 2013-01-09 ENCOUNTER — Encounter: Payer: Self-pay | Admitting: Internal Medicine

## 2013-01-09 DIAGNOSIS — M25561 Pain in right knee: Secondary | ICD-10-CM | POA: Insufficient documentation

## 2013-01-09 LAB — HM DIABETES FOOT EXAM: HM Diabetic Foot Exam: NORMAL

## 2013-01-09 NOTE — Assessment & Plan Note (Addendum)
Acute with no memory of trauma. However exam suggests otherwise since she does have a bruise in that area. Plain films were normal today. She has no signs of septic joint or inflammatory arthritis by labs done today. Trial of prednisone taper and ice pack every 8 hours. Crutches were offered but deferred by patient.

## 2013-01-09 NOTE — Assessment & Plan Note (Addendum)
Well-controlled on current medications.  hemoglobin A1c has been consistently less than 7.0 . Patient is  up-to-date on annual dilated retinal eye exam,  foot exam is normal today and there is no proteinuria.  I will  Repeat the urine microalbumin to creatinine ratio at next visit. Patient is is on the appropriate medications. Lab Results  Component Value Date   HGBA1C 6.6* 01/08/2013

## 2013-01-09 NOTE — Assessment & Plan Note (Signed)
LDL and triglycerides are at goal on current medications. He has no side effects and liver enzymes are normal. No changes today. Lab Results  Component Value Date   CHOL 135 01/08/2013   HDL 43.10 01/08/2013   LDLCALC 73 01/08/2013   LDLDIRECT 150.9 05/26/2008   TRIG 97.0 01/08/2013   CHOLHDL 3 01/08/2013

## 2013-01-10 DIAGNOSIS — Z79899 Other long term (current) drug therapy: Secondary | ICD-10-CM | POA: Diagnosis not present

## 2013-01-10 NOTE — Addendum Note (Signed)
Addended by: Sherlene Shams on: 01/10/2013 07:14 AM   Modules accepted: Orders

## 2013-01-20 ENCOUNTER — Other Ambulatory Visit: Payer: Self-pay | Admitting: Internal Medicine

## 2013-01-20 ENCOUNTER — Telehealth: Payer: Self-pay | Admitting: Internal Medicine

## 2013-01-20 ENCOUNTER — Other Ambulatory Visit (INDEPENDENT_AMBULATORY_CARE_PROVIDER_SITE_OTHER): Payer: Medicare Other

## 2013-01-20 DIAGNOSIS — D649 Anemia, unspecified: Secondary | ICD-10-CM | POA: Diagnosis not present

## 2013-01-20 LAB — IRON AND TIBC
Iron: 31 ug/dL — ABNORMAL LOW (ref 42–145)
UIBC: 313 ug/dL (ref 125–400)

## 2013-01-20 MED ORDER — VALSARTAN-HYDROCHLOROTHIAZIDE 320-25 MG PO TABS: 1.0000 | ORAL_TABLET | Freq: Every day | ORAL | Status: DC

## 2013-01-20 MED ORDER — METFORMIN HCL ER 750 MG PO TB24: 750.0000 mg | ORAL_TABLET | Freq: Every day | ORAL | Status: DC

## 2013-01-21 DIAGNOSIS — N189 Chronic kidney disease, unspecified: Secondary | ICD-10-CM | POA: Insufficient documentation

## 2013-01-21 DIAGNOSIS — D509 Iron deficiency anemia, unspecified: Secondary | ICD-10-CM | POA: Insufficient documentation

## 2013-01-21 LAB — FOLATE RBC: RBC Folate: 1461 ng/mL (ref >=366)

## 2013-01-21 MED ORDER — FERROUS SULFATE 325 (65 FE) MG PO TBEC: 325.0000 mg | DELAYED_RELEASE_TABLET | Freq: Every day | ORAL | Status: DC

## 2013-01-21 NOTE — Addendum Note (Signed)
Addended by: Sherlene Shams on: 01/21/2013 01:38 PM   Modules accepted: Orders

## 2013-01-22 LAB — PROTEIN ELECTROPHORESIS, SERUM
Albumin ELP: 57.8 % (ref 55.8–66.1)
Alpha-2-Globulin: 11.1 % (ref 7.1–11.8)
Beta Globulin: 7.1 % (ref 4.7–7.2)
Total Protein, Serum Electrophoresis: 6.7 g/dL (ref 6.0–8.3)

## 2013-01-23 ENCOUNTER — Ambulatory Visit: Payer: Medicare Other

## 2013-01-24 LAB — FECAL OCCULT BLOOD, IMMUNOCHEMICAL: Fecal Occult Bld: POSITIVE

## 2013-01-27 ENCOUNTER — Encounter: Payer: Self-pay | Admitting: Internal Medicine

## 2013-01-27 ENCOUNTER — Ambulatory Visit (INDEPENDENT_AMBULATORY_CARE_PROVIDER_SITE_OTHER): Payer: Medicare Other | Admitting: Internal Medicine

## 2013-01-27 VITALS — BP 168/64 | HR 89 | Temp 98.7°F | Resp 16 | Wt 127.5 lb

## 2013-01-27 DIAGNOSIS — M542 Cervicalgia: Secondary | ICD-10-CM

## 2013-01-27 DIAGNOSIS — M489 Spondylopathy, unspecified: Secondary | ICD-10-CM

## 2013-01-27 DIAGNOSIS — M539 Dorsopathy, unspecified: Secondary | ICD-10-CM | POA: Diagnosis not present

## 2013-01-27 DIAGNOSIS — D509 Iron deficiency anemia, unspecified: Secondary | ICD-10-CM | POA: Diagnosis not present

## 2013-01-27 DIAGNOSIS — M47812 Spondylosis without myelopathy or radiculopathy, cervical region: Secondary | ICD-10-CM | POA: Diagnosis not present

## 2013-01-27 MED ORDER — PREDNISONE (PAK) 10 MG PO TABS: ORAL_TABLET | ORAL | Status: DC

## 2013-01-27 MED ORDER — METHOCARBAMOL 500 MG PO TABS: ORAL_TABLET | ORAL | Status: DC

## 2013-01-27 NOTE — Assessment & Plan Note (Addendum)
Mild, hgb 11.2 FOBT was positive.  Has history of internal hemorrhoids. Will refer to Lynnae Prude for evaluation of GI tract.

## 2013-01-27 NOTE — Progress Notes (Signed)
Patient ID: Maureen Morales, female   DOB: 09-15-1922, 79 y.o.   MRN: [id number]   Patient Active Problem List   Diagnosis Date Noted  . Cervical spine disease 01/27/2013  . Anemia, iron deficiency 01/21/2013  . Right medial knee pain 01/09/2013  . Back pain, thoracic 08/27/2012  . Feeling of chest tightness 08/07/2012  . Aortic stenosis, mild 05/06/2012  . Asthma 07/04/2011  . Hemorrhoids, internal 04/05/2011  . Nummular eczema 01/16/2011  . Hypertension   . Type II diabetes mellitus, well controlled   . Hyperlipidemia     Subjective:  CC:   Chief Complaint  Patient presents with  . Acute Visit    both arms and radiates through back and shoulders    HPI:   Maureen Morales a 79 y.o. female who presents with posterior neck pain radiating to both arms  for the past week,  Aggravated by flexion of head.  pai radiates down both arms without numbess or weakness. Aggravated by sleeping without a pillow.  relieved transiently with ibuprofen. No  Recent trauma or unusual aqctivity.  Daughter thinks her husband's paranoid delusions are contributing.    Past Medical History  Diagnosis Date  . Hypertension   . Diabetes mellitus   . Hyperlipidemia   . Viral gastroenteritis due to Norwalk-like agent Nov 2012    Past Surgical History  Procedure Laterality Date  . Bladder surgery    . Abdominal hysterectomy    . Cardiac catheterization         The following portions of the patient's history were reviewed and updated as appropriate: Allergies, current medications, and problem list.    Review of Systems:   12 Pt  review of systems was negative except those addressed in the HPI,     History   Social History  . Marital Status: Single    Spouse Name: N/A    Number of Children: N/A  . Years of Education: N/A   Occupational History  . Not on file.   Social History Main Topics  . Smoking status: Never Smoker   . Smokeless tobacco: Never Used  . Alcohol Use: No  .  Drug Use: No  . Sexual Activity: Not on file   Other Topics Concern  . Not on file   Social History Narrative  . No narrative on file    Objective:  Filed Vitals:   01/27/13 1516  BP: 168/64  Pulse: 89  Temp: 98.7 F (37.1 C)  Resp: 16     General appearance: alert, cooperative and appears stated age Ears: normal TM's and external ear canals both ears Throat: lips, mucosa, and tongue normal; teeth and gums normal Neck: no adenopathy, no carotid bruit, supple, symmetrical, trachea midline and thyroid not enlarged, symmetric, no tenderness/mass/nodules Back: symmetric, no curvature. ROM normal. No CVA tenderness. Lungs: clear to auscultation bilaterally Heart: regular rate and rhythm, S1, S2 normal, no murmur, click, rub or gallop Abdomen: soft, non-tender; bowel sounds normal; no masses,  no organomegaly Pulses: 2+ and symmetric Skin: Skin color, texture, turgor normal. No rashes or lesions Lymph nodes: Cervical, supraclavicular, and axillary nodes normal.  Assessment and Plan:  Cervical spine disease With muscle spasm noted on exam. Normal DTRs, sensation  and strength.  Soft cervical color,  MR and prednisone taper. Massage,  Heat and ice alternating.   Anemia, iron deficiency Mild, hgb 11.2 FOBT was positive.  Has history of internal hemorrhoids. Will refer to [facility] for evaluation of GI tract.  Updated Medication List Outpatient Encounter Prescriptions as of 01/27/2013  Medication Sig Dispense Refill  . acetaminophen (TYLENOL) 500 MG tablet Take 500 mg by mouth as needed.        [name] BIOTIN 5000 PO Take by mouth.        . calcium carbonate (TUMS) 500 MG chewable tablet Chew 1 tablet by mouth. Taken when needed      . Calcium-Mag-Vit C-Vit D 185-28-2.5-200 CAPS Take 2 tablets by mouth daily.      . cholecalciferol (VITAMIN D) 1000 UNITS tablet Take 1,000 Units by mouth daily.        [name] estrogens, conjugated, (PREMARIN) 0.3 MG tablet Take one tablet every day.   30 tablet  11  . fenofibrate 160 MG tablet Take 1 tablet (160 mg total) by mouth daily. For high triglycerides  30 tablet  5  . ferrous sulfate 325 (65 FE) MG EC tablet Take 1 tablet (325 mg total) by mouth daily with breakfast.  30 tablet  3  . ibuprofen (ADVIL,MOTRIN) 200 MG tablet Take 200 mg by mouth as needed.        . loratadine (CLARITIN) 10 MG tablet Take 10 mg by mouth daily as needed.        . metFORMIN (GLUCOPHAGE-XR) 750 MG 24 hr tablet Take 1 tablet (750 mg total) by mouth daily with breakfast.  30 tablet  5  . metoprolol succinate (TOPROL-XL) 25 MG 24 hr tablet Take 1 tablet (25 mg total) by mouth daily.  30 tablet  1  . Multiple Vitamins-Minerals (ABC PLUS SENIOR PO) Take by mouth.        . Omega-3 Fatty Acids (FISH OIL) 1200 MG CAPS Take by mouth.        . Probiotic Product (PHILLIPS COLON HEALTH PO) Take 1 tablet by mouth daily.        . valsartan-hydrochlorothiazide (DIOVAN-HCT) 320-25 MG per tablet Take 1 tablet by mouth daily.  30 tablet  6  . albuterol (PROVENTIL HFA;VENTOLIN HFA) 108 (90 BASE) MCG/ACT inhaler Inhale 2 puffs into the lungs every 6 (six) hours as needed for wheezing.  1 Inhaler  11  . butalbital-acetaminophen-caffeine (FIORICET) 50-325-40 MG per tablet Take 1-2 tablets by mouth every 6 (six) hours as needed for headache.  20 tablet  1  . ciprofloxacin (CIPRO) 250 MG tablet Take 1 tablet (250 mg total) by mouth 2 (two) times daily.  14 tablet  0  . methocarbamol (ROBAXIN) 500 MG tablet Once or twice daily as tolerated for neck muscle spasm  30 tablet  0  . predniSONE (STERAPRED UNI-PAK) 10 MG tablet 6 tablets on Day 1 , then reduce by 1 tablet daily until gone  21 tablet  0  . predniSONE (STERAPRED UNI-PAK) 10 MG tablet 6 tablets on Day 1 , then reduce by 1 tablet daily until gone  21 tablet  0  . pseudoephedrine (SUDAFED) 30 MG tablet Take 30 mg by mouth every 6 (six) hours as needed for congestion.      . TDaP (BOOSTRIX) 5-2.5-18.5 LF-MCG/0.5 injection Inject  0.5 mLs into the muscle once.  0.5 mL  0   No facility-administered encounter medications on file as of 01/27/2013.     Orders Placed This Encounter  Procedures  . Cervical collar soft  . Ambulatory referral to Gastroenterology    No Follow-up on file.

## 2013-01-27 NOTE — Assessment & Plan Note (Signed)
With muscle spasm noted on exam. Normal DTRs, sensation  and strength.  Soft cervical color,  MR and prednisone taper. Massage,  Heat and ice alternating.

## 2013-02-07 ENCOUNTER — Telehealth: Payer: Self-pay | Admitting: Internal Medicine

## 2013-02-07 ENCOUNTER — Other Ambulatory Visit: Payer: Self-pay | Admitting: Internal Medicine

## 2013-02-07 DIAGNOSIS — M25522 Pain in left elbow: Secondary | ICD-10-CM

## 2013-02-07 NOTE — Telephone Encounter (Signed)
Pt's daughter came in and says that Maureen Morales arm is still hurting at her elbow and she was wondering if she could get an order to have her arm x-rayed over at [facility] ???

## 2013-02-07 NOTE — Telephone Encounter (Signed)
C/O elbow pain from previous fall please advise of X- RAY

## 2013-02-28 ENCOUNTER — Telehealth: Payer: Self-pay | Admitting: *Deleted

## 2013-02-28 MED ORDER — GLUCOSE BLOOD VI STRP: ORAL_STRIP | Status: DC

## 2013-02-28 NOTE — Telephone Encounter (Signed)
Refill Request  Bayer Contour Test  #50   Use as per package instructions check sugars twice a day

## 2013-03-03 ENCOUNTER — Telehealth: Payer: Self-pay | Admitting: *Deleted

## 2013-03-03 ENCOUNTER — Other Ambulatory Visit: Payer: Self-pay | Admitting: *Deleted

## 2013-03-03 NOTE — Telephone Encounter (Signed)
Refill Request  Microlet Clr Lancet   #100   Use as directed    

## 2013-03-03 NOTE — Telephone Encounter (Signed)
Refill Request  Microlet Clr Lancet   #100   Use as directed

## 2013-03-18 DIAGNOSIS — R195 Other fecal abnormalities: Secondary | ICD-10-CM | POA: Diagnosis not present

## 2013-03-18 DIAGNOSIS — D649 Anemia, unspecified: Secondary | ICD-10-CM | POA: Diagnosis not present

## 2013-03-19 DIAGNOSIS — D649 Anemia, unspecified: Secondary | ICD-10-CM | POA: Diagnosis not present

## 2013-03-24 ENCOUNTER — Telehealth: Payer: Self-pay | Admitting: Internal Medicine

## 2013-03-24 NOTE — Telephone Encounter (Signed)
Please advise 

## 2013-03-24 NOTE — Telephone Encounter (Signed)
Maureen Morales wants to speak with you about the suggestion from Dr. Mechele Collin that her mother needs to have a colonoscopy ,due to blood in her stool. She is concerned about her mother's age.

## 2013-03-24 NOTE — Telephone Encounter (Signed)
I need the daughter's name and phone number

## 2013-03-25 ENCOUNTER — Telehealth: Payer: Self-pay | Admitting: Internal Medicine

## 2013-03-25 NOTE — Telephone Encounter (Signed)
Whitleigh Garramone [phone] Sorry!

## 2013-03-25 NOTE — Telephone Encounter (Signed)
I spoke with Maureen Morales. bc the numbner given to me was Maureen Morales's,  Not Maureen Morales's

## 2013-03-26 ENCOUNTER — Telehealth: Payer: Self-pay | Admitting: *Deleted

## 2013-03-26 NOTE — Telephone Encounter (Signed)
Pharmacy Note:  Microlet CLR Lancet  #100  Use as directed   

## 2013-04-29 ENCOUNTER — Encounter: Payer: Self-pay | Admitting: Internal Medicine

## 2013-04-29 ENCOUNTER — Ambulatory Visit (INDEPENDENT_AMBULATORY_CARE_PROVIDER_SITE_OTHER): Payer: Medicare Other | Admitting: Internal Medicine

## 2013-04-29 VITALS — BP 136/66 | HR 65 | Temp 97.8°F | Resp 12 | Wt 125.0 lb

## 2013-04-29 DIAGNOSIS — I10 Essential (primary) hypertension: Secondary | ICD-10-CM | POA: Diagnosis not present

## 2013-04-29 DIAGNOSIS — D509 Iron deficiency anemia, unspecified: Secondary | ICD-10-CM | POA: Diagnosis not present

## 2013-04-29 DIAGNOSIS — E119 Type 2 diabetes mellitus without complications: Secondary | ICD-10-CM | POA: Diagnosis not present

## 2013-04-29 LAB — CBC WITH DIFFERENTIAL/PLATELET
Basophils Absolute: 0 K/uL (ref 0.0–0.1)
Basophils Relative: 0.2 % (ref 0.0–3.0)
Eosinophils Absolute: 0.2 K/uL (ref 0.0–0.7)
Eosinophils Relative: 3 % (ref 0.0–5.0)
HCT: 33.5 % — ABNORMAL LOW (ref 36.0–46.0)
Hemoglobin: 11.5 g/dL — ABNORMAL LOW (ref 12.0–15.0)
Lymphocytes Relative: 21.9 % (ref 12.0–46.0)
Lymphs Abs: 1.7 K/uL (ref 0.7–4.0)
MCHC: 34.2 g/dL (ref 30.0–36.0)
MCV: 85.4 fl (ref 78.0–100.0)
Monocytes Absolute: 0.7 K/uL (ref 0.1–1.0)
Monocytes Relative: 8.8 % (ref 3.0–12.0)
Neutro Abs: 5 K/uL (ref 1.4–7.7)
Neutrophils Relative %: 66.1 % (ref 43.0–77.0)
Platelets: 285 K/uL (ref 150.0–400.0)
RBC: 3.93 Mil/uL (ref 3.87–5.11)
RDW: 14 % (ref 11.5–14.6)
WBC: 7.5 K/uL (ref 4.5–10.5)

## 2013-04-29 LAB — COMPREHENSIVE METABOLIC PANEL WITH GFR
ALT: 33 U/L (ref 0–35)
AST: 32 U/L (ref 0–37)
Albumin: 4.4 g/dL (ref 3.5–5.2)
Alkaline Phosphatase: 40 U/L (ref 39–117)
BUN: 30 mg/dL — ABNORMAL HIGH (ref 6–23)
CO2: 27 meq/L (ref 19–32)
Calcium: 10.5 mg/dL (ref 8.4–10.5)
Chloride: 102 meq/L (ref 96–112)
Creatinine, Ser: 1.1 mg/dL (ref 0.4–1.2)
GFR: 47.8 mL/min — ABNORMAL LOW
Glucose, Bld: 103 mg/dL — ABNORMAL HIGH (ref 70–99)
Potassium: 4 meq/L (ref 3.5–5.1)
Sodium: 139 meq/L (ref 135–145)
Total Bilirubin: 0.8 mg/dL (ref 0.3–1.2)
Total Protein: 7.3 g/dL (ref 6.0–8.3)

## 2013-04-29 LAB — IRON AND TIBC
%SAT: 28 % (ref 20–55)
Iron: 107 ug/dL (ref 42–145)
TIBC: 388 ug/dL (ref 250–470)
UIBC: 281 ug/dL (ref 125–400)

## 2013-04-29 LAB — LIPID PANEL
Cholesterol: 151 mg/dL (ref 0–200)
HDL: 46.2 mg/dL
LDL Cholesterol: 77 mg/dL (ref 0–99)
Total CHOL/HDL Ratio: 3
Triglycerides: 138 mg/dL (ref 0.0–149.0)
VLDL: 27.6 mg/dL (ref 0.0–40.0)

## 2013-04-29 LAB — HEMOGLOBIN A1C: Hgb A1c MFr Bld: 6.7 % — ABNORMAL HIGH (ref 4.6–6.5)

## 2013-04-29 LAB — HM DIABETES FOOT EXAM: HM DIABETIC FOOT EXAM: NORMAL

## 2013-04-29 NOTE — Patient Instructions (Signed)
We are repeating your iron studies today as well as your diabetes labs.  Please repeat the stool test before your appt with dr Mechele Collin

## 2013-04-29 NOTE — Progress Notes (Signed)
Pre-visit discussion using our clinic review tool. No additional management support is needed unless otherwise documented below in the visit note.  

## 2013-04-30 ENCOUNTER — Other Ambulatory Visit: Payer: Self-pay | Admitting: *Deleted

## 2013-04-30 ENCOUNTER — Other Ambulatory Visit (INDEPENDENT_AMBULATORY_CARE_PROVIDER_SITE_OTHER): Payer: Medicare Other

## 2013-04-30 DIAGNOSIS — Z1211 Encounter for screening for malignant neoplasm of colon: Secondary | ICD-10-CM | POA: Diagnosis not present

## 2013-04-30 LAB — FECAL OCCULT BLOOD, IMMUNOCHEMICAL: Fecal Occult Bld: NEGATIVE

## 2013-05-01 ENCOUNTER — Encounter: Payer: Self-pay | Admitting: Internal Medicine

## 2013-05-01 NOTE — Assessment & Plan Note (Signed)
Well-controlled on current medications.  hemoglobin A1c has been consistently less than 7.0 .s He is up-to-date on eye exams and her foot exam is normal. l we'll repeat his urine microalbumin to creatinine ratio at next visit. sHe is on the appropriate medications. Lab Results  Component Value Date   HGBA1C 6.7* 04/29/2013   Lab Results  Component Value Date   CHOL 151 04/29/2013   HDL 46.20 04/29/2013   LDLCALC 77 04/29/2013   LDLDIRECT 150.9 05/26/2008   TRIG 138.0 04/29/2013   CHOLHDL 3 04/29/2013

## 2013-05-01 NOTE — Progress Notes (Signed)
Patient ID: Maureen Morales, female   DOB: February 13, 1923, 80 y.o.   MRN: [id number]  Patient Active Problem List   Diagnosis Date Noted  . Cervical spine disease 01/27/2013  . Anemia, iron deficiency 01/21/2013  . Right medial knee pain 01/09/2013  . Back pain, thoracic 08/27/2012  . Feeling of chest tightness 08/07/2012  . Aortic stenosis, mild 05/06/2012  . Asthma 07/04/2011  . Hemorrhoids, internal 04/05/2011  . Nummular eczema 01/16/2011  . Hypertension   . Type II diabetes mellitus, well controlled   . Hyperlipidemia     Subjective:  CC:   Chief Complaint  Patient presents with  . Follow-up    3 months    HPI:   Maureen Morales a 80 y.o. female who presents for 3 month follow up on chronic conditions including DM Type 2, hyperlipidemia, hypertension and iron deficiency anemia with recent positive FOBT.  Accompanied by daughter [name] today. Appetite good,  But has lost 2 lbs unintentionally since last visit .  Very active physically. No change in bowel habits.  Denies fatigue,  Insomnia.    Past Medical History  Diagnosis Date  . Hypertension   . Diabetes mellitus   . Hyperlipidemia   . Viral gastroenteritis due to Norwalk-like agent Nov 2012    Past Surgical History  Procedure Laterality Date  . Bladder surgery    . Abdominal hysterectomy    . Cardiac catheterization         The following portions of the patient's history were reviewed and updated as appropriate: Allergies, current medications, and problem list.    Review of Systems:   12 Pt  review of systems was negative except those addressed in the HPI,     History   Social History  . Marital Status: Single    Spouse Name: N/A    Number of Children: N/A  . Years of Education: N/A   Occupational History  . Not on file.   Social History Main Topics  . Smoking status: Never Smoker   . Smokeless tobacco: Never Used  . Alcohol Use: No  . Drug Use: No  . Sexual Activity: Not on file   Other  Topics Concern  . Not on file   Social History Narrative  . No narrative on file    Objective:  Filed Vitals:   04/29/13 1004  BP: 136/66  Pulse: 65  Temp: 97.8 F (36.6 C)  Resp: 12     General appearance: alert, cooperative and appears stated age Ears: normal TM's and external ear canals both ears Throat: lips, mucosa, and tongue normal; teeth and gums normal Neck: no adenopathy, no carotid bruit, supple, symmetrical, trachea midline and thyroid not enlarged, symmetric, no tenderness/mass/nodules Back: symmetric, no curvature. ROM normal. No CVA tenderness. Lungs: clear to auscultation bilaterally Heart: regular rate and rhythm, S1, S2 normal, no murmur, click, rub or gallop Abdomen: soft, non-tender; bowel sounds normal; no masses,  no organomegaly Pulses: 2+ and symmetric Skin: Skin color, texture, turgor normal. No rashes or lesions Lymph nodes: Cervical, supraclavicular, and axillary nodes normal.  Assessment and Plan:  Anemia, iron deficiency Mild, hgb 11.2 FOBT was positive.  Has been referred to [facility] for endoscopic evaluation of GI tract which has not been done due to her age.  She has been taking an OTC iron supplement and repeat studies today show slight improvement.  She is willing to accept the risks of colonoscopy if repeat FOBT is still positive.   Lab  Results  Component Value Date   WBC 7.5 04/29/2013   HGB 11.5* 04/29/2013   HCT 33.5* 04/29/2013   MCV 85.4 04/29/2013   PLT 285.0 04/29/2013    Lab Results  Component Value Date   IRON 107 04/29/2013   TIBC 388 04/29/2013   FERRITIN 86.0 01/20/2013     Hypertension Well controlled on current regimen. Renal function stable, no changes today.  Lab Results  Component Value Date   CREATININE 1.1 04/29/2013   Lab Results  Component Value Date   MICROALBUR 1.4 01/08/2013    Type II diabetes mellitus, well controlled Well-controlled on current medications.  hemoglobin A1c has been  consistently less than 7.0 .s He is up-to-date on eye exams and her foot exam is normal. l we'll repeat his urine microalbumin to creatinine ratio at next visit. sHe is on the appropriate medications. Lab Results  Component Value Date   HGBA1C 6.7* 04/29/2013   Lab Results  Component Value Date   CHOL 151 04/29/2013   HDL 46.20 04/29/2013   LDLCALC 77 04/29/2013   LDLDIRECT 150.9 05/26/2008   TRIG 138.0 04/29/2013   CHOLHDL 3 04/29/2013    A total of 40 minutes was spent with patient more than half of which was spent in counseling, reviewing records from other prviders and coordination of care.  Updated Medication List Outpatient Encounter Prescriptions as of 04/29/2013  Medication Sig  . acetaminophen (TYLENOL) 500 MG tablet Take 500 mg by mouth as needed.    [name] albuterol (PROVENTIL HFA;VENTOLIN HFA) 108 (90 BASE) MCG/ACT inhaler Inhale 2 puffs into the lungs every 6 (six) hours as needed for wheezing.  [name] BIOTIN 5000 PO Take by mouth.    . butalbital-acetaminophen-caffeine (FIORICET) 50-325-40 MG per tablet Take 1-2 tablets by mouth every 6 (six) hours as needed for headache.  . calcium carbonate (TUMS) 500 MG chewable tablet Chew 1 tablet by mouth. Taken when needed  . Calcium-Mag-Vit C-Vit D 185-28-2.5-200 CAPS Take 2 tablets by mouth daily.  . cholecalciferol (VITAMIN D) 1000 UNITS tablet Take 1,000 Units by mouth daily.    [name] estrogens, conjugated, (PREMARIN) 0.3 MG tablet Take one tablet every day.  . fenofibrate 160 MG tablet Take 1 tablet (160 mg total) by mouth daily. For high triglycerides  . glucose blood (BAYER CONTOUR TEST) test strip Use as instructed  . ibuprofen (ADVIL,MOTRIN) 200 MG tablet Take 200 mg by mouth as needed.    . loratadine (CLARITIN) 10 MG tablet Take 10 mg by mouth daily as needed.    . metFORMIN (GLUCOPHAGE-XR) 750 MG 24 hr tablet Take 1 tablet (750 mg total) by mouth daily with breakfast.  . methocarbamol (ROBAXIN) 500 MG tablet Once or twice daily as  tolerated for neck muscle spasm  . metoprolol succinate (TOPROL-XL) 25 MG 24 hr tablet TAKE 1 TABLET EVERY DAY  . Multiple Vitamins-Minerals (ABC PLUS SENIOR PO) Take by mouth.    . Omega-3 Fatty Acids (FISH OIL) 1200 MG CAPS Take by mouth.    . Probiotic Product (PHILLIPS COLON HEALTH PO) Take 1 tablet by mouth daily.    . pseudoephedrine (SUDAFED) 30 MG tablet Take 30 mg by mouth every 6 (six) hours as needed for congestion.  . valsartan-hydrochlorothiazide (DIOVAN-HCT) 320-25 MG per tablet Take 1 tablet by mouth daily.  . predniSONE (STERAPRED UNI-PAK) 10 MG tablet 6 tablets on Day 1 , then reduce by 1 tablet daily until gone  . predniSONE (STERAPRED UNI-PAK) 10 MG tablet 6 tablets on Day  1 , then reduce by 1 tablet daily until gone  . TDaP (BOOSTRIX) 5-2.5-18.5 LF-MCG/0.5 injection Inject 0.5 mLs into the muscle once.  . [DISCONTINUED] ciprofloxacin (CIPRO) 250 MG tablet Take 1 tablet (250 mg total) by mouth 2 (two) times daily.  . [DISCONTINUED] ferrous sulfate 325 (65 FE) MG EC tablet Take 1 tablet (325 mg total) by mouth daily with breakfast.     Orders Placed This Encounter  Procedures  . Fecal occult blood, imunochemical  . Comprehensive metabolic panel  . Lipid panel  . Hemoglobin A1c  . CBC with Differential  . Iron and TIBC  . HM DIABETES FOOT EXAM    No Follow-up on file.

## 2013-05-01 NOTE — Assessment & Plan Note (Signed)
Well controlled on current regimen. Renal function stable, no changes today.  Lab Results  Component Value Date   CREATININE 1.1 04/29/2013   Lab Results  Component Value Date   MICROALBUR 1.4 01/08/2013

## 2013-05-01 NOTE — Assessment & Plan Note (Signed)
Mild, hgb 11.2 FOBT was positive.  Has been referred to [facility] for endoscopic evaluation of GI tract which has not been done due to her age.  She has been taking an OTC iron supplement and repeat studies today show slight improvement.  She is willing to accept the risks of colonoscopy if repeat FOBT is still positive.   Lab Results  Component Value Date   WBC 7.5 04/29/2013   HGB 11.5* 04/29/2013   HCT 33.5* 04/29/2013   MCV 85.4 04/29/2013   PLT 285.0 04/29/2013    Lab Results  Component Value Date   IRON 107 04/29/2013   TIBC 388 04/29/2013   FERRITIN 86.0 01/20/2013

## 2013-05-02 ENCOUNTER — Encounter: Payer: Self-pay | Admitting: *Deleted

## 2013-05-06 ENCOUNTER — Telehealth: Payer: Self-pay | Admitting: *Deleted

## 2013-05-06 NOTE — Telephone Encounter (Signed)
Pharmacy Note:  Microlet CLR Lancet  #100  Use as directed   

## 2013-05-06 NOTE — Telephone Encounter (Signed)
Rx phoned to pharmacy.  

## 2013-05-06 NOTE — Telephone Encounter (Signed)
Pharmacy Note:  Microlet CLR Lancet  #100  Use as directed

## 2013-05-27 DIAGNOSIS — D509 Iron deficiency anemia, unspecified: Secondary | ICD-10-CM | POA: Diagnosis not present

## 2013-05-28 ENCOUNTER — Other Ambulatory Visit (HOSPITAL_COMMUNITY): Payer: Self-pay | Admitting: *Deleted

## 2013-05-28 DIAGNOSIS — I359 Nonrheumatic aortic valve disorder, unspecified: Secondary | ICD-10-CM

## 2013-06-05 ENCOUNTER — Other Ambulatory Visit: Payer: Self-pay

## 2013-06-05 ENCOUNTER — Other Ambulatory Visit (INDEPENDENT_AMBULATORY_CARE_PROVIDER_SITE_OTHER): Payer: Medicare Other

## 2013-06-05 DIAGNOSIS — I369 Nonrheumatic tricuspid valve disorder, unspecified: Secondary | ICD-10-CM | POA: Diagnosis not present

## 2013-06-05 DIAGNOSIS — I359 Nonrheumatic aortic valve disorder, unspecified: Secondary | ICD-10-CM | POA: Diagnosis not present

## 2013-06-05 DIAGNOSIS — R011 Cardiac murmur, unspecified: Secondary | ICD-10-CM

## 2013-07-02 ENCOUNTER — Other Ambulatory Visit: Payer: Self-pay | Admitting: Internal Medicine

## 2013-07-03 ENCOUNTER — Telehealth: Payer: Self-pay | Admitting: Internal Medicine

## 2013-07-03 NOTE — Telephone Encounter (Signed)
The patient is needing to know if she has had a tetanus shot

## 2013-07-03 NOTE — Telephone Encounter (Signed)
Patient was given script for tdap on 07/29/12 advised patient to check with pharmacy.

## 2013-07-09 ENCOUNTER — Ambulatory Visit: Payer: Medicare Other | Admitting: *Deleted

## 2013-07-09 ENCOUNTER — Telehealth: Payer: Self-pay | Admitting: Internal Medicine

## 2013-07-09 VITALS — BP 138/68 | HR 66 | Temp 98.4°F | Resp 16 | Wt 127.0 lb

## 2013-07-09 DIAGNOSIS — M542 Cervicalgia: Secondary | ICD-10-CM

## 2013-07-09 DIAGNOSIS — M546 Pain in thoracic spine: Secondary | ICD-10-CM

## 2013-07-09 DIAGNOSIS — M549 Dorsalgia, unspecified: Secondary | ICD-10-CM

## 2013-07-09 LAB — POCT URINALYSIS DIPSTICK
BILIRUBIN UA: NEGATIVE
GLUCOSE UA: NEGATIVE
KETONES UA: NEGATIVE
Leukocytes, UA: NEGATIVE
Nitrite, UA: NEGATIVE
PH UA: 7
Protein, UA: NEGATIVE
Spec Grav, UA: 1.015
UROBILINOGEN UA: 0.2

## 2013-07-09 MED ORDER — PREDNISONE (PAK) 10 MG PO TABS: ORAL_TABLET | ORAL | Status: DC

## 2013-07-09 MED ORDER — TRIAMCINOLONE ACETONIDE 0.5 % EX OINT: 1.0000 "application " | TOPICAL_OINTMENT | Freq: Two times a day (BID) | CUTANEOUS | Status: DC

## 2013-07-09 MED ORDER — METHOCARBAMOL 500 MG PO TABS: ORAL_TABLET | ORAL | Status: DC

## 2013-07-09 NOTE — Assessment & Plan Note (Signed)
Recurrent,  Will call in prendnisone taper and robaxin.  Prior trial of ibuprofen caused gastritis.

## 2013-07-09 NOTE — Telephone Encounter (Signed)
The patient's daughter called needing an appointment for her mother . ? UTI.

## 2013-07-09 NOTE — Telephone Encounter (Signed)
Patient scheduled for nurse visit at 10.30 today. Patient c/o back pain and is afraid it is from strain on trying to help husband in bed, or kidney pain will get urine and evaluate .

## 2013-07-09 NOTE — Progress Notes (Signed)
Patient c/o back pain on right side from bottom of rib cage to hip. Turning makes the pain worse, and this has been for 6 days and seems be getting worse. Patient thinks maybe she injured back during funeral by wearing different shoes.

## 2013-07-30 ENCOUNTER — Other Ambulatory Visit: Payer: Self-pay | Admitting: Internal Medicine

## 2013-07-31 ENCOUNTER — Ambulatory Visit (INDEPENDENT_AMBULATORY_CARE_PROVIDER_SITE_OTHER): Payer: Medicare Other | Admitting: Internal Medicine

## 2013-07-31 ENCOUNTER — Encounter: Payer: Self-pay | Admitting: Internal Medicine

## 2013-07-31 VITALS — BP 148/78 | HR 76 | Temp 97.7°F | Resp 16 | Wt 127.5 lb

## 2013-07-31 DIAGNOSIS — I359 Nonrheumatic aortic valve disorder, unspecified: Secondary | ICD-10-CM

## 2013-07-31 DIAGNOSIS — E119 Type 2 diabetes mellitus without complications: Secondary | ICD-10-CM

## 2013-07-31 DIAGNOSIS — M546 Pain in thoracic spine: Secondary | ICD-10-CM

## 2013-07-31 DIAGNOSIS — Z23 Encounter for immunization: Secondary | ICD-10-CM | POA: Diagnosis not present

## 2013-07-31 DIAGNOSIS — D509 Iron deficiency anemia, unspecified: Secondary | ICD-10-CM

## 2013-07-31 DIAGNOSIS — I10 Essential (primary) hypertension: Secondary | ICD-10-CM

## 2013-07-31 DIAGNOSIS — I350 Nonrheumatic aortic (valve) stenosis: Secondary | ICD-10-CM

## 2013-07-31 LAB — POCT URINALYSIS DIPSTICK
BILIRUBIN UA: NEGATIVE
Glucose, UA: NEGATIVE
Ketones, UA: NEGATIVE
Leukocytes, UA: NEGATIVE
Nitrite, UA: NEGATIVE
PH UA: 6
Protein, UA: NEGATIVE
SPEC GRAV UA: 1.02
Urobilinogen, UA: 0.2

## 2013-07-31 LAB — COMPREHENSIVE METABOLIC PANEL
ALBUMIN: 3.8 g/dL (ref 3.5–5.2)
ALK PHOS: 51 U/L (ref 39–117)
ALT: 25 U/L (ref 0–35)
AST: 25 U/L (ref 0–37)
BILIRUBIN TOTAL: 0.5 mg/dL (ref 0.3–1.2)
BUN: 27 mg/dL — AB (ref 6–23)
CO2: 29 mEq/L (ref 19–32)
Calcium: 10 mg/dL (ref 8.4–10.5)
Chloride: 102 mEq/L (ref 96–112)
Creatinine, Ser: 1.1 mg/dL (ref 0.4–1.2)
GFR: 51.95 mL/min — ABNORMAL LOW (ref >60.00)
GLUCOSE: 122 mg/dL — AB (ref 70–99)
POTASSIUM: 4.4 meq/L (ref 3.5–5.1)
Sodium: 139 mEq/L (ref 135–145)
Total Protein: 6.6 g/dL (ref 6.0–8.3)

## 2013-07-31 LAB — HEMOGLOBIN A1C: Hgb A1c MFr Bld: 7.3 % — ABNORMAL HIGH (ref 4.6–6.5)

## 2013-07-31 NOTE — Patient Instructions (Signed)
You can use 2 ibuprofen twice daily maximum for back pain and can add tylenol 500 mg twice daily as well   The prednisone caused your blood sugars to go up and should come down now that it is out of your system.

## 2013-07-31 NOTE — Progress Notes (Signed)
Patient ID: Maureen Morales, female   DOB: 1923-05-01, 80 y.o.   MRN: [id number]  Patient Active Problem List   Diagnosis Date Noted  . Cervical spine disease 01/27/2013  . Anemia, iron deficiency 01/21/2013  . Right medial knee pain 01/09/2013  . Back pain, thoracic 08/27/2012  . Feeling of chest tightness 08/07/2012  . Aortic stenosis, mild 05/06/2012  . Asthma 07/04/2011  . Hemorrhoids, internal 04/05/2011  . Nummular eczema 01/16/2011  . Hypertension   . Type II diabetes mellitus, well controlled   . Hyperlipidemia     Subjective:  CC:   Chief Complaint  Patient presents with  . Follow-up    HPI:   Maureen Morales is a 80 y.o. female who presents for f u on DM.  3 month.  Sugars have been elevated  for the last week.  She was prescribed a prednisone  taper two weeks ago for recent  episodeof lumbago   Still having tightness right lateral CVA area  Aggravated by activites of baking and pastry making.    Husband Maureen Morales "getting worse" secondary to progressive dementia.  Has a daytime caregiver Maureen Morales ,  But frequently calls on Maureen Morales  In the middle of the night,  Discussed getting a night time caregiver to avoid caregiver fatigue .  Patient is not getting enough sleep due to Maureen Morales and the dog that sleeps with them in the bed. "Maureen Morales loves that dog more than me"   Past Medical History  Diagnosis Date  . Hypertension   . Diabetes mellitus   . Hyperlipidemia   . Viral gastroenteritis due to [place]-like agent Nov 2012    Past Surgical History  Procedure Laterality Date  . Bladder surgery    . Abdominal hysterectomy    . Cardiac catheterization         The following portions of the patient's history were reviewed and updated as appropriate: Allergies, current medications, and problem list.    Review of Systems:   Patient denies headache, fevers, malaise, unintentional weight loss, skin rash, eye pain, sinus congestion and sinus pain, sore throat, dysphagia,   hemoptysis , cough, dyspnea, wheezing, chest pain, palpitations, orthopnea, edema, abdominal pain, nausea, melena, diarrhea, constipation, flank pain, dysuria, hematuria, urinary  Frequency, nocturia, numbness, tingling, seizures,  Focal weakness, Loss of consciousness,  Tremor, insomnia, depression, anxiety, and suicidal ideation.     History   Social History  . Marital Status: Single    Spouse Name: N/A    Number of Children: N/A  . Years of Education: N/A   Occupational History  . Not on file.   Social History Main Topics  . Smoking status: Never Smoker   . Smokeless tobacco: Never Used  . Alcohol Use: No  . Drug Use: No  . Sexual Activity: Not on file   Other Topics Concern  . Not on file   Social History Narrative  . No narrative on file    Objective:  Filed Vitals:   07/31/13 1033  BP: 148/78  Pulse: 76  Temp: 97.7 F (36.5 C)  Resp: 16     General appearance: alert, cooperative and appears stated age Ears: normal TM's and external ear canals both ears Throat: lips, mucosa, and tongue normal; teeth and gums normal Neck: no adenopathy, no carotid bruit, supple, symmetrical, trachea midline and thyroid not enlarged, symmetric, no tenderness/mass/nodules Back: symmetric, no curvature. ROM normal. No CVA tenderness. Lungs: clear to auscultation bilaterally Heart: regular rate and rhythm, S1, S2  normal, no murmur, click, rub or gallop Abdomen: soft, non-tender; bowel sounds normal; no masses,  no organomegaly Pulses: 2+ and symmetric Skin: Skin color, texture, turgor normal. No rashes or lesions Lymph nodes: Cervical, supraclavicular, and axillary nodes normal.  Assessment and Plan:  Hypertension Elevated today.  Reviewed list of meds, patient is  taking OTC meds that could be causing,. It.  Have asked patient to recheck bp at home a minimum of 5 times over the next 4 weeks and call readings to office for adjustment of medications.    Lab Results  Component  Value Date   CREATININE 1.1 07/31/2013     Type II diabetes mellitus, well controlled Recent elevations attributed to use of prednisone. But A1c has risen.  I haverecommended a low glycemic index diet and daily exercise  with a goal of 30 minutes of aerobic exercise a minimum of 5 days per week.     Lab Results  Component Value Date   HGBA1C 7.3* 07/31/2013   Lab Results  Component Value Date   MICROALBUR 1.4 01/08/2013   Lab Results  Component Value Date   CHOL 151 04/29/2013   HDL 46.20 04/29/2013   LDLCALC 77 04/29/2013   LDLDIRECT 150.9 05/26/2008   TRIG 138.0 04/29/2013   CHOLHDL 3 04/29/2013      Aortic stenosis, mild Asymptomatic , with normal EF.   Back pain, thoracic Secondary to muscle sprain with underlying degenerative disk disease resulting in endplate spurring by lumbar spine films done Nov 2013.  There is no spinal /vertebral tenderness to suspect vertebral fracture .  Continue NSAIDs and tylenol prn. Core strengthening exercises recommended   Anemia, iron deficiency Mild, hgb 11.2 FOBT was positive.  Has a history of diverticuliosis by last colonoscopy in 2009, at age 53. Has been referred to [facility] for endoscopic evaluation of GI tract which has not been done due to her age.  She has been taking an OTC iron supplement and repeat studies today show slight improvement.  She is willing to accept the risks of colonoscopy if repeat FOBT is still positive.   Lab Results  Component Value Date   WBC 7.5 04/29/2013   HGB 11.5* 04/29/2013   HCT 33.5* 04/29/2013   MCV 85.4 04/29/2013   PLT 285.0 04/29/2013    Lab Results  Component Value Date   IRON 107 04/29/2013   TIBC 388 04/29/2013   FERRITIN 86.0 01/20/2013        Updated Medication List Outpatient Encounter Prescriptions as of 07/31/2013  Medication Sig  . BIOTIN 5000 PO Take by mouth.    . calcium carbonate (TUMS) 500 MG chewable tablet Chew 1 tablet by mouth. Taken when needed  .  Calcium-Mag-Vit C-Vit D 185-28-2.5-200 CAPS Take 2 tablets by mouth daily.  . cetirizine (ZYRTEC) 10 MG tablet Take 10 mg by mouth daily.  . cholecalciferol (VITAMIN D) 1000 UNITS tablet Take 1,000 Units by mouth daily.    . fenofibrate 160 MG tablet TAKE 1 TABLET EVERY DAY FOR HIGH TRIGLYCERIDES  . glucose blood (BAYER CONTOUR TEST) test strip Use as instructed  . ibuprofen (ADVIL,MOTRIN) 200 MG tablet Take 200 mg by mouth as needed.    . metFORMIN (GLUCOPHAGE-XR) 750 MG 24 hr tablet Take 1 tablet (750 mg total) by mouth daily with breakfast.  . metoprolol succinate (TOPROL-XL) 25 MG 24 hr tablet TAKE 1 TABLET EVERY DAY  . Multiple Vitamins-Minerals (ABC PLUS SENIOR PO) Take by mouth.    . Omega-3 Fatty  Acids (FISH OIL) 1200 MG CAPS Take by mouth.    . Probiotic Product (PHILLIPS COLON HEALTH PO) Take 1 tablet by mouth daily.    [name] triamcinolone ointment (KENALOG) 0.5 % Apply 1 application topically 2 (two) times daily. As needed for itching  . valsartan-hydrochlorothiazide (DIOVAN-HCT) 320-25 MG per tablet Take 1 tablet by mouth daily.  . TDaP (BOOSTRIX) 5-2.5-18.5 LF-MCG/0.5 injection Inject 0.5 mLs into the muscle once.  . [DISCONTINUED] acetaminophen (TYLENOL) 500 MG tablet Take 500 mg by mouth as needed.    . [DISCONTINUED] albuterol (PROVENTIL HFA;VENTOLIN HFA) 108 (90 BASE) MCG/ACT inhaler Inhale 2 puffs into the lungs every 6 (six) hours as needed for wheezing.  . [DISCONTINUED] butalbital-acetaminophen-caffeine (FIORICET) 50-325-40 MG per tablet Take 1-2 tablets by mouth every 6 (six) hours as needed for headache.  . [DISCONTINUED] estrogens, conjugated, (PREMARIN) 0.3 MG tablet Take one tablet every day.  . [DISCONTINUED] loratadine (CLARITIN) 10 MG tablet Take 10 mg by mouth daily as needed.    . [DISCONTINUED] methocarbamol (ROBAXIN) 500 MG tablet Once or twice daily as tolerated for neck muscle spasm  . [DISCONTINUED] predniSONE (STERAPRED UNI-PAK) 10 MG tablet 6 tablets on Day 1 ,  then reduce by 1 tablet daily until gone  . [DISCONTINUED] predniSONE (STERAPRED UNI-PAK) 10 MG tablet 6 tablets on Day 1 , then reduce by 1 tablet daily until gone  . [DISCONTINUED] pseudoephedrine (SUDAFED) 30 MG tablet Take 30 mg by mouth every 6 (six) hours as needed for congestion.     Orders Placed This Encounter  Procedures  . Pneumococcal conjugate vaccine 13-valent  . Hemoglobin A1c  . Comprehensive metabolic panel  . POCT urinalysis dipstick    No Follow-up on file.

## 2013-07-31 NOTE — Progress Notes (Signed)
Pre-visit discussion using our clinic review tool. No additional management support is needed unless otherwise documented below in the visit note.  

## 2013-08-01 LAB — HM DIABETES FOOT EXAM: HM Diabetic Foot Exam: NORMAL

## 2013-08-02 ENCOUNTER — Encounter: Payer: Self-pay | Admitting: Internal Medicine

## 2013-08-02 NOTE — Assessment & Plan Note (Addendum)
Mild, hgb 11.2 FOBT was positive.  Has a history of diverticuliosis by last colonoscopy in 2009, at age 80. Has been referred to [facility] for endoscopic evaluation of GI tract which has not been done due to her age.  She has been taking an OTC iron supplement and repeat studies today show slight improvement.  She is willing to accept the risks of colonoscopy if repeat FOBT is still positive.   Lab Results  Component Value Date   WBC 7.5 04/29/2013   HGB 11.5* 04/29/2013   HCT 33.5* 04/29/2013   MCV 85.4 04/29/2013   PLT 285.0 04/29/2013    Lab Results  Component Value Date   IRON 107 04/29/2013   TIBC 388 04/29/2013   FERRITIN 86.0 01/20/2013

## 2013-08-02 NOTE — Assessment & Plan Note (Signed)
Recent elevations attributed to use of prednisone. But A1c has risen.  I haverecommended a low glycemic index diet and daily exercise  with a goal of 30 minutes of aerobic exercise a minimum of 5 days per week.     Lab Results  Component Value Date   HGBA1C 7.3* 07/31/2013   Lab Results  Component Value Date   MICROALBUR 1.4 01/08/2013   Lab Results  Component Value Date   CHOL 151 04/29/2013   HDL 46.20 04/29/2013   LDLCALC 77 04/29/2013   LDLDIRECT 150.9 05/26/2008   TRIG 138.0 04/29/2013   CHOLHDL 3 04/29/2013

## 2013-08-02 NOTE — Assessment & Plan Note (Signed)
Elevated today.  Reviewed list of meds, patient is  taking OTC meds that could be causing,. It.  Have asked patient to recheck bp at home a minimum of 5 times over the next 4 weeks and call readings to office for adjustment of medications.    Lab Results  Component Value Date   CREATININE 1.1 07/31/2013

## 2013-08-02 NOTE — Assessment & Plan Note (Addendum)
Secondary to muscle sprain with underlying degenerative disk disease resulting in endplate spurring by lumbar spine films done Nov 2013.  There is no spinal /vertebral tenderness to suspect vertebral fracture .  Continue NSAIDs and tylenol prn. Core strengthening exercises recommended

## 2013-08-02 NOTE — Assessment & Plan Note (Signed)
Asymptomatic , with normal EF.

## 2013-08-04 ENCOUNTER — Encounter: Payer: Self-pay | Admitting: *Deleted

## 2013-08-04 ENCOUNTER — Telehealth: Payer: Self-pay | Admitting: Internal Medicine

## 2013-08-04 ENCOUNTER — Other Ambulatory Visit: Payer: Self-pay | Admitting: Internal Medicine

## 2013-08-04 NOTE — Telephone Encounter (Signed)
Relevant patient education mailed to patient.  

## 2013-08-19 ENCOUNTER — Telehealth: Payer: Self-pay | Admitting: Internal Medicine

## 2013-08-19 DIAGNOSIS — Z1239 Encounter for other screening for malignant neoplasm of breast: Secondary | ICD-10-CM

## 2013-08-19 NOTE — Telephone Encounter (Signed)
Please place order.

## 2013-08-19 NOTE — Telephone Encounter (Signed)
Patient left voicemail requesting an appointment for her mammogram. Please advise when this has been set up.

## 2013-08-19 NOTE — Telephone Encounter (Signed)
Patient notified order in place.

## 2013-08-19 NOTE — Telephone Encounter (Signed)
ordered

## 2013-08-21 ENCOUNTER — Encounter: Payer: Self-pay | Admitting: Emergency Medicine

## 2013-08-22 ENCOUNTER — Telehealth: Payer: Self-pay

## 2013-08-22 NOTE — Telephone Encounter (Signed)
Relevant patient education mailed to patient.  

## 2013-08-29 ENCOUNTER — Other Ambulatory Visit: Payer: Self-pay | Admitting: *Deleted

## 2013-08-29 MED ORDER — CLOBETASOL PROPIONATE 0.05 % EX CREA: 1.0000 "application " | TOPICAL_CREAM | Freq: Every day | CUTANEOUS | Status: DC

## 2013-08-29 NOTE — Addendum Note (Signed)
Addended by: [name] on: 08/29/2013 05:35 PM   Modules accepted: Orders

## 2013-08-29 NOTE — Telephone Encounter (Signed)
Patient stated that you had discussed this during last visit and thought the prednisone she was taking for muscle weakness might clear up the itching. Patient stated it only occurs at night none during the day no drainage or rash apparent just night time itching. Patient stated like having really dry skin and just itches.

## 2013-08-29 NOTE — Telephone Encounter (Signed)
Pharmacy Note:  Maureen Morales was expecting something for vaginal itching - ( bothersome at bedtime)    This is the only topical we have for her - is this what you wanted her to use externally for vaginally itching?

## 2013-08-29 NOTE — Telephone Encounter (Signed)
No we dont' typically use this for vaginal itching.  If she is having discharge.,  fluconzole needed.  If just itching she will need a stronger ointment to use every night for 6  weeks,  Then twice weekly to treat a certain vaginal condition ,  Find out symptoms in entirelty

## 2013-08-29 NOTE — Telephone Encounter (Signed)
Clobetasol cream sent to [facility]

## 2013-09-10 ENCOUNTER — Ambulatory Visit: Payer: Self-pay | Admitting: Internal Medicine

## 2013-09-10 DIAGNOSIS — H6120 Impacted cerumen, unspecified ear: Secondary | ICD-10-CM | POA: Diagnosis not present

## 2013-09-10 DIAGNOSIS — H903 Sensorineural hearing loss, bilateral: Secondary | ICD-10-CM | POA: Diagnosis not present

## 2013-09-10 DIAGNOSIS — Z1231 Encounter for screening mammogram for malignant neoplasm of breast: Secondary | ICD-10-CM | POA: Diagnosis not present

## 2013-09-12 DIAGNOSIS — H8110 Benign paroxysmal vertigo, unspecified ear: Secondary | ICD-10-CM | POA: Diagnosis not present

## 2013-10-01 ENCOUNTER — Encounter: Payer: Self-pay | Admitting: Internal Medicine

## 2013-10-15 ENCOUNTER — Other Ambulatory Visit: Payer: Self-pay | Admitting: Internal Medicine

## 2013-10-25 LAB — HM DIABETES EYE EXAM

## 2013-10-28 DIAGNOSIS — M779 Enthesopathy, unspecified: Secondary | ICD-10-CM | POA: Diagnosis not present

## 2013-10-28 DIAGNOSIS — Q6689 Other  specified congenital deformities of feet: Secondary | ICD-10-CM | POA: Diagnosis not present

## 2013-10-28 DIAGNOSIS — M2040 Other hammer toe(s) (acquired), unspecified foot: Secondary | ICD-10-CM | POA: Diagnosis not present

## 2013-10-29 DIAGNOSIS — H0019 Chalazion unspecified eye, unspecified eyelid: Secondary | ICD-10-CM | POA: Diagnosis not present

## 2013-11-13 ENCOUNTER — Ambulatory Visit (INDEPENDENT_AMBULATORY_CARE_PROVIDER_SITE_OTHER): Payer: Medicare Other | Admitting: Internal Medicine

## 2013-11-13 ENCOUNTER — Encounter: Payer: Self-pay | Admitting: Internal Medicine

## 2013-11-13 VITALS — BP 128/60 | HR 70 | Temp 98.0°F | Resp 18 | Ht 61.0 in | Wt 127.0 lb

## 2013-11-13 DIAGNOSIS — R350 Frequency of micturition: Secondary | ICD-10-CM | POA: Diagnosis not present

## 2013-11-13 DIAGNOSIS — I359 Nonrheumatic aortic valve disorder, unspecified: Secondary | ICD-10-CM

## 2013-11-13 DIAGNOSIS — R1032 Left lower quadrant pain: Secondary | ICD-10-CM

## 2013-11-13 DIAGNOSIS — K5900 Constipation, unspecified: Secondary | ICD-10-CM | POA: Diagnosis not present

## 2013-11-13 DIAGNOSIS — Z0000 Encounter for general adult medical examination without abnormal findings: Secondary | ICD-10-CM | POA: Insufficient documentation

## 2013-11-13 DIAGNOSIS — I350 Nonrheumatic aortic (valve) stenosis: Secondary | ICD-10-CM

## 2013-11-13 DIAGNOSIS — E119 Type 2 diabetes mellitus without complications: Secondary | ICD-10-CM | POA: Diagnosis not present

## 2013-11-13 DIAGNOSIS — I10 Essential (primary) hypertension: Secondary | ICD-10-CM

## 2013-11-13 DIAGNOSIS — D509 Iron deficiency anemia, unspecified: Secondary | ICD-10-CM

## 2013-11-13 LAB — URINALYSIS, ROUTINE W REFLEX MICROSCOPIC
Bilirubin Urine: NEGATIVE
Hgb urine dipstick: NEGATIVE
KETONES UR: NEGATIVE
LEUKOCYTES UA: NEGATIVE
Nitrite: NEGATIVE
PH: 6 (ref 5.0–8.0)
Specific Gravity, Urine: <=1.005 — AB (ref 1.000–1.030)
Total Protein, Urine: NEGATIVE
UROBILINOGEN UA: 0.2 (ref 0.0–1.0)
Urine Glucose: NEGATIVE

## 2013-11-13 LAB — MICROALBUMIN / CREATININE URINE RATIO
Creatinine,U: 38.1 mg/dL
Microalb Creat Ratio: 2.4 mg/g (ref 0.0–30.0)
Microalb, Ur: 0.9 mg/dL (ref 0.0–1.9)

## 2013-11-13 LAB — COMPREHENSIVE METABOLIC PANEL
ALBUMIN: 4 g/dL (ref 3.5–5.2)
ALT: 33 U/L (ref 0–35)
AST: 36 U/L (ref 0–37)
Alkaline Phosphatase: 43 U/L (ref 39–117)
BUN: 35 mg/dL — AB (ref 6–23)
CALCIUM: 10.4 mg/dL (ref 8.4–10.5)
CHLORIDE: 102 meq/L (ref 96–112)
CO2: 28 mEq/L (ref 19–32)
Creatinine, Ser: 1.1 mg/dL (ref 0.4–1.2)
GFR: 49.23 mL/min — ABNORMAL LOW (ref >60.00)
GLUCOSE: 138 mg/dL — AB (ref 70–99)
POTASSIUM: 4.4 meq/L (ref 3.5–5.1)
Sodium: 139 mEq/L (ref 135–145)
Total Bilirubin: 0.6 mg/dL (ref 0.2–1.2)
Total Protein: 6.8 g/dL (ref 6.0–8.3)

## 2013-11-13 LAB — HEMOGLOBIN A1C: HEMOGLOBIN A1C: 6.9 % — AB (ref 4.6–6.5)

## 2013-11-13 LAB — LDL CHOLESTEROL, DIRECT: LDL DIRECT: 75.5 mg/dL

## 2013-11-13 LAB — LIPASE: Lipase: 44 U/L (ref 11.0–59.0)

## 2013-11-13 MED ORDER — LACTULOSE 20 GM/30ML PO SOLN: 30.0000 mL | ORAL | Status: DC | PRN

## 2013-11-13 NOTE — Assessment & Plan Note (Addendum)
Recent onset, accompanied by recurrent lower abdominal pain, in the setting of iron deficiency and heme positive stool in the recent past.  We are compelled to investigate for colon CA given her symptoms and FH despite her age.  Will discuss with Dr Vira Agar re proceeding with colonoscopy vs capsule colonography  Plain films of abd ordered and showed gallstones. Will order ultrasound for further evaluation.

## 2013-11-13 NOTE — Assessment & Plan Note (Signed)
Mild, hgb 11.2 FOBT was positive and she was referred to GI, who deferred colonoscopy due to age.  Given her new onset constipation and FH of colon Ca, I am recommending evaluation again

## 2013-11-13 NOTE — Patient Instructions (Addendum)
Reduce your calcium pill to one daily  Get the rest from diet (almond/coconut milk, unsweetened is a great source) as well as vegetables and yogurt)  My cell phone is [phone]  Please use it whever you need to    I am ordering an x ray of your abdomen to be done at [facility] tomorrow  This will help me determine if you are still constipated and can use the Lactulose liquid laxative I sent to your pharmacy  I will discuss getting a colonoscopy with Dr Jason Fila still may need a tetanus shot,  We will chec with [facility] Maintenance, Female A healthy lifestyle and preventative care can promote health and wellness.  Maintain regular health, dental, and eye exams.  Eat a healthy diet. Foods like vegetables, fruits, whole grains, low-fat dairy products, and lean protein foods contain the nutrients you need without too many calories. Decrease your intake of foods high in solid fats, added sugars, and salt. Get information about a proper diet from your caregiver, if necessary.  Regular physical exercise is one of the most important things you can do for your health. Most adults should get at least 150 minutes of moderate-intensity exercise (any activity that increases your heart rate and causes you to sweat) each week. In addition, most adults need muscle-strengthening exercises on 2 or more days a week.   Maintain a healthy weight. The body mass index (BMI) is a screening tool to identify possible weight problems. It provides an estimate of body fat based on height and weight. Your caregiver can help determine your BMI, and can help you achieve or maintain a healthy weight. For adults 20 years and older:  A BMI below 18.5 is considered underweight.  A BMI of 18.5 to 24.9 is normal.  A BMI of 25 to 29.9 is considered overweight.  A BMI of 30 and above is considered obese.  Maintain normal blood lipids and cholesterol by exercising and minimizing your intake of  saturated fat. Eat a balanced diet with plenty of fruits and vegetables. Blood tests for lipids and cholesterol should begin at age 58 and be repeated every 5 years. If your lipid or cholesterol levels are high, you are over 50, or you are a high risk for heart disease, you may need your cholesterol levels checked more frequently.Ongoing high lipid and cholesterol levels should be treated with medicines if diet and exercise are not effective.  If you smoke, find out from your caregiver how to quit. If you do not use tobacco, do not start.  Lung cancer screening is recommended for adults aged 64-80 years who are at high risk for developing lung cancer because of a history of smoking. Yearly low-dose computed tomography (CT) is recommended for people who have at least a 30-pack-year history of smoking and are a current smoker or have quit within the past 15 years. A pack year of smoking is smoking an average of 1 pack of cigarettes a day for 1 year (for example: 1 pack a day for 30 years or 2 packs a day for 15 years). Yearly screening should continue until the smoker has stopped smoking for at least 15 years. Yearly screening should also be stopped for people who develop a health problem that would prevent them from having lung cancer treatment.  If you are pregnant, do not drink alcohol. If you are breastfeeding, be very cautious about drinking alcohol. If you are not pregnant and choose  to drink alcohol, do not exceed 1 drink per day. One drink is considered to be 12 ounces (355 mL) of beer, 5 ounces (148 mL) of wine, or 1.5 ounces (44 mL) of liquor.  Avoid use of street drugs. Do not share needles with anyone. Ask for help if you need support or instructions about stopping the use of drugs.  High blood pressure causes heart disease and increases the risk of stroke. Blood pressure should be checked at least every 1 to 2 years. Ongoing high blood pressure should be treated with medicines, if weight loss  and exercise are not effective.  If you are 39 to 78 years old, ask your caregiver if you should take aspirin to prevent strokes.  Diabetes screening involves taking a blood sample to check your fasting blood sugar level. This should be done once every 3 years, after age 63, if you are within normal weight and without risk factors for diabetes. Testing should be considered at a younger age or be carried out more frequently if you are overweight and have at least 1 risk factor for diabetes.  Breast cancer screening is essential preventative care for women. You should practice "breast self-awareness." This means understanding the normal appearance and feel of your breasts and may include breast self-examination. Any changes detected, no matter how small, should be reported to a caregiver. Women in their 13s and 30s should have a clinical breast exam (CBE) by a caregiver as part of a regular health exam every 1 to 3 years. After age 49, women should have a CBE every year. Starting at age 38, women should consider having a mammogram (breast X-ray) every year. Women who have a family history of breast cancer should talk to their caregiver about genetic screening. Women at a high risk of breast cancer should talk to their caregiver about having an MRI and a mammogram every year.  Breast cancer gene (BRCA)-related cancer risk assessment is recommended for women who have family members with BRCA-related cancers. BRCA-related cancers include breast, ovarian, tubal, and peritoneal cancers. Having family members with these cancers may be associated with an increased risk for harmful changes (mutations) in the breast cancer genes BRCA1 and BRCA2. Results of the assessment will determine the need for genetic counseling and BRCA1 and BRCA2 testing.  The Pap test is a screening test for cervical cancer. Women should have a Pap test starting at age 53. Between ages 11 and 4, Pap tests should be repeated every 2 years.  Beginning at age 56, you should have a Pap test every 3 years as long as the past 3 Pap tests have been normal. If you had a hysterectomy for a problem that was not cancer or a condition that could lead to cancer, then you no longer need Pap tests. If you are between ages 61 and 70, and you have had normal Pap tests going back 10 years, you no longer need Pap tests. If you have had past treatment for cervical cancer or a condition that could lead to cancer, you need Pap tests and screening for cancer for at least 20 years after your treatment. If Pap tests have been discontinued, risk factors (such as a new sexual partner) need to be reassessed to determine if screening should be resumed. Some women have medical problems that increase the chance of getting cervical cancer. In these cases, your caregiver may recommend more frequent screening and Pap tests.  The human papillomavirus (HPV) test is an additional test that  may be used for cervical cancer screening. The HPV test looks for the virus that can cause the cell changes on the cervix. The cells collected during the Pap test can be tested for HPV. The HPV test could be used to screen women aged 70 years and older, and should be used in women of any age who have unclear Pap test results. After the age of 105, women should have HPV testing at the same frequency as a Pap test.  Colorectal cancer can be detected and often prevented. Most routine colorectal cancer screening begins at the age of 83 and continues through age 91. However, your caregiver may recommend screening at an earlier age if you have risk factors for colon cancer. On a yearly basis, your caregiver may provide home test kits to check for hidden blood in the stool. Use of a small camera at the end of a tube, to directly examine the colon (sigmoidoscopy or colonoscopy), can detect the earliest forms of colorectal cancer. Talk to your caregiver about this at age 86, when routine screening begins.  Direct examination of the colon should be repeated every 5 to 10 years through age 31, unless early forms of pre-cancerous polyps or small growths are found.  Hepatitis C blood testing is recommended for all people born from [birth year] through 1965 and any individual with known risks for hepatitis C.  Practice safe sex. Use condoms and avoid high-risk sexual practices to reduce the spread of sexually transmitted infections (STIs). Sexually active women aged 28 and younger should be checked for Chlamydia, which is a common sexually transmitted infection. Older women with new or multiple partners should also be tested for Chlamydia. Testing for other STIs is recommended if you are sexually active and at increased risk.  Osteoporosis is a disease in which the bones lose minerals and strength with aging. This can result in serious bone fractures. The risk of osteoporosis can be identified using a bone density scan. Women ages 43 and over and women at risk for fractures or osteoporosis should discuss screening with their caregivers. Ask your caregiver whether you should be taking a calcium supplement or vitamin D to reduce the rate of osteoporosis.  Menopause can be associated with physical symptoms and risks. Hormone replacement therapy is available to decrease symptoms and risks. You should talk to your caregiver about whether hormone replacement therapy is right for you.  Use sunscreen. Apply sunscreen liberally and repeatedly throughout the day. You should seek shade when your shadow is shorter than you. Protect yourself by wearing long sleeves, pants, a wide-brimmed hat, and sunglasses year round, whenever you are outdoors.  Notify your caregiver of new moles or changes in moles, especially if there is a change in shape or color. Also notify your caregiver if a mole is larger than the size of a pencil eraser.  Stay current with your immunizations. Document Released: 10/31/2010 Document Revised: 08/12/2012  Document Reviewed: 03/19/2013 [facility] Patient Information 2015 [place], [place]. This information is not intended to replace advice given to you by your health care provider. Make sure you discuss any questions you have with your health care provider.

## 2013-11-13 NOTE — Assessment & Plan Note (Addendum)
Annual comprehensive exam was done including breast, excluding pelvic and PAP smear.   Screening recommendatiw were discussed and hand out given.

## 2013-11-13 NOTE — Progress Notes (Signed)
Pre-visit discussion using our clinic review tool. No additional management support is needed unless otherwise documented below in the visit note.  

## 2013-11-13 NOTE — Progress Notes (Signed)
Patient ID: Maureen Morales, female   DOB: 1922/08/15, 80 y.o.   MRN: [id number] The patient is here for annual Medicare wellness examination and management of other chronic and acute problems including diabetes and hypertension  Abd pain:  She has had  4 episodes of severe abdominal pain  Occurring in the middle of the night and lasting for several hours. Has been constipated  Which is unusual for her.  Has not had colonoscopy despite fobt positive stools due to age.  Wants to discuss with Dr Theador Hawthorne colonoscopy vs colonoscopy.    The risk factors are reflected in the social history.  The roster of all physicians providing medical care to patient - is listed in the Snapshot section of the chart.  Activities of daily living:  The patient is 100% independent in all ADLs: dressing, toileting, feeding as well as independent mobility  Home safety : The patient has smoke detectors in the home. They wear seatbelts.  There are no firearms at home. There is no violence in the home.   There is no risks for hepatitis, STDs or HIV. There is no   history of blood transfusion. They have no travel history to infectious disease endemic areas of the world.  The patient has seen their dentist in the last six month. They have seen their eye doctor in the last year. They admit to slight hearing difficulty with regard to whispered voices and some television programs.  They have deferred audiologic testing in the last year.  They do not  have excessive sun exposure. Discussed the need for sun protection: hats, long sleeves and use of sunscreen if there is significant sun exposure.   Diet: the importance of a healthy diet is discussed. They do have a healthy diet.  The benefits of regular aerobic exercise were discussed. She walks 4 times per week ,  20 minutes.   Depression screen: there are no signs or vegative symptoms of depression- irritability, change in appetite, anhedonia, sadness/tearfullness.  Cognitive  assessment: the patient manages all their financial and personal affairs and is actively engaged. They could relate day,date,year and events; recalled 2/3 objects at 3 minutes; performed clock-face test normally.  The following portions of the patient's history were reviewed and updated as appropriate: allergies, current medications, past family history, past medical history,  past surgical history, past social history  and problem list.  Visual acuity was not assessed per patient preference since she has regular follow up with her ophthalmologist. Hearing and body mass index were assessed and reviewed.   During the course of the visit the patient was educated and counseled about appropriate screening and preventive services including : fall prevention , diabetes screening, nutrition counseling, colorectal cancer screening, and recommended immunizations.    Objective:  BP 128/60  Pulse 70  Temp(Src) 98 F (36.7 C) (Oral)  Resp 18  Ht 5\' 1"  (1.549 m)  Wt 127 lb (57.607 kg)  BMI 24.01 kg/m2  SpO2 99%  General appearance: alert, cooperative and appears stated age Head: Normocephalic, without obvious abnormality, atraumatic Eyes: conjunctivae/corneas clear. PERRL, EOM's intact. Fundi benign. Ears: normal TM's and external ear canals both ears Nose: Nares normal. Septum midline. Mucosa normal. No drainage or sinus tenderness. Throat: lips, mucosa, and tongue normal; teeth and gums normal Neck: no adenopathy, no carotid bruit, no JVD, supple, symmetrical, trachea midline and thyroid not enlarged, symmetric, no tenderness/mass/nodules Lungs: clear to auscultation bilaterally Breasts: normal appearance, no masses or tenderness Heart: regular rate and rhythm,  S1, S2 normal, no murmur, click, rub or gallop Abdomen: soft, non-tender; bowel sounds normal; no masses,  no organomegaly Extremities: extremities normal, atraumatic, no cyanosis or edema Pulses: 2+ and symmetric Skin: Skin color,  texture, turgor normal. No rashes or lesions Neurologic: Alert and oriented X 3, normal strength and tone. Normal symmetric reflexes. Normal coordination and gait.   Assessment and Plan:  Encounter for preventive health examination Annual comprehensive exam was done including breast, excluding pelvic and PAP smear.   Screening recommendatiw were discussed and hand out given.   Unspecified constipation Recent onset, accompanied by recurrent lower abdominal pain, in the setting of iron deficiency and heme positive stool in the recent past.  We are compelled to investigate for colon CA given her symptoms and FH despite her age.  Will discuss with Dr Vira Agar re proceeding with colonoscopy vs capsule colonography  Plain films of abd ordered and showed gallstones. Will order ultrasound for further evaluation.   Anemia, iron deficiency Mild, hgb 11.2 FOBT was positive and she was referred to GI, who deferred colonoscopy due to age.  Given her new onset constipation and FH of colon Ca, I am recommending evaluation again   Hypertension Well controlled on current regimen. Renal function stable, no changes today.  Lab Results  Component Value Date   CREATININE 1.1 11/13/2013    Lab Results  Component Value Date   NA 139 11/13/2013   K 4.4 11/13/2013   CL 102 11/13/2013   CO2 28 11/13/2013     Aortic stenosis, mild Mild, asymptomatic.  continue annual screening   Type II diabetes mellitus, well controlled  Historically well-controlled hemoglobin A1c has been consistently at or  less than 7.0 . Patient is up-to-date on eye exams and foot exam is normal today. Patient has no microalbuminuria. Patient is tolerating statin therapy for CAD risk reduction and on ACE/ARB for renal protection and hypertension   Lab Results  Component Value Date   HGBA1C 6.9* 11/13/2013   Lab Results  Component Value Date   MICROALBUR 0.9 11/13/2013   Lab Results  Component Value Date   CHOL 151 04/29/2013   HDL  46.20 04/29/2013   LDLCALC 77 04/29/2013   LDLDIRECT 75.5 11/13/2013   TRIG 138.0 04/29/2013   CHOLHDL 3 04/29/2013         Updated Medication List Outpatient Encounter Prescriptions as of 11/13/2013  Medication Sig  . calcium carbonate (TUMS) 500 MG chewable tablet Chew 1 tablet by mouth. Taken when needed  . Calcium-Mag-Vit C-Vit D 185-28-2.5-200 CAPS Take 2 tablets by mouth daily.  . cetirizine (ZYRTEC) 10 MG tablet Take 10 mg by mouth daily.  . cholecalciferol (VITAMIN D) 1000 UNITS tablet Take 1,000 Units by mouth daily.    . fenofibrate 160 MG tablet TAKE 1 TABLET EVERY DAY FOR HIGH TRIGLYCERIDES  . glucose blood (BAYER CONTOUR TEST) test strip Use as instructed  . ibuprofen (ADVIL,MOTRIN) 200 MG tablet Take 200 mg by mouth as needed.    . metFORMIN (GLUCOPHAGE-XR) 750 MG 24 hr tablet TAKE 1 TABLET EVERY DAY WITH BREAKFAST  . metoprolol succinate (TOPROL-XL) 25 MG 24 hr tablet TAKE 1 TABLET EVERY DAY  . Multiple Vitamins-Minerals (ABC PLUS SENIOR PO) Take by mouth.    . Omega-3 Fatty Acids (FISH OIL) 1200 MG CAPS Take by mouth.    . Probiotic Product (PHILLIPS COLON HEALTH PO) Take 1 tablet by mouth daily.    . valsartan-hydrochlorothiazide (DIOVAN-HCT) 320-25 MG per tablet TAKE 1 TABLET EVERY  DAY USUALLY IN THE MORNING  . BIOTIN 5000 PO Take by mouth.    . clobetasol cream (TEMOVATE) 1.31 % Apply 1 application topically at bedtime.  . Lactulose 20 GM/30ML SOLN Take 30 mLs (20 g total) by mouth as needed. To releive constipation  . TDaP (BOOSTRIX) 5-2.5-18.5 LF-MCG/0.5 injection Inject 0.5 mLs into the muscle once.  . triamcinolone ointment (KENALOG) 0.5 % Apply 1 application topically 2 (two) times daily. As needed for itching

## 2013-11-14 ENCOUNTER — Other Ambulatory Visit: Payer: Self-pay | Admitting: Internal Medicine

## 2013-11-14 ENCOUNTER — Ambulatory Visit (INDEPENDENT_AMBULATORY_CARE_PROVIDER_SITE_OTHER)
Admission: RE | Admit: 2013-11-14 | Discharge: 2013-11-14 | Disposition: A | Discharge status: Home / Self Care | Payer: Medicare Other | Source: Ambulatory Visit | Attending: Internal Medicine | Admitting: Internal Medicine

## 2013-11-14 DIAGNOSIS — R109 Unspecified abdominal pain: Secondary | ICD-10-CM

## 2013-11-14 DIAGNOSIS — R1032 Left lower quadrant pain: Secondary | ICD-10-CM | POA: Diagnosis not present

## 2013-11-15 ENCOUNTER — Encounter: Payer: Self-pay | Admitting: Internal Medicine

## 2013-11-15 NOTE — Assessment & Plan Note (Signed)
Well controlled on current regimen. Renal function stable, no changes today.  Lab Results  Component Value Date   CREATININE 1.1 11/13/2013    Lab Results  Component Value Date   NA 139 11/13/2013   K 4.4 11/13/2013   CL 102 11/13/2013   CO2 28 11/13/2013

## 2013-11-15 NOTE — Assessment & Plan Note (Signed)
Historically well-controlled hemoglobin A1c has been consistently at or  less than 7.0 . Patient is up-to-date on eye exams and foot exam is normal today. Patient has no microalbuminuria. Patient is tolerating statin therapy for CAD risk reduction and on ACE/ARB for renal protection and hypertension   Lab Results  Component Value Date   HGBA1C 6.9* 11/13/2013   Lab Results  Component Value Date   MICROALBUR 0.9 11/13/2013   Lab Results  Component Value Date   CHOL 151 04/29/2013   HDL 46.20 04/29/2013   LDLCALC 77 04/29/2013   LDLDIRECT 75.5 11/13/2013   TRIG 138.0 04/29/2013   CHOLHDL 3 04/29/2013

## 2013-11-15 NOTE — Assessment & Plan Note (Signed)
Mild, asymptomatic.  continue annual screening  

## 2013-11-19 ENCOUNTER — Telehealth: Payer: Self-pay | Admitting: Internal Medicine

## 2013-11-19 NOTE — Telephone Encounter (Signed)
Patient called left message and ask if appointment been made for Ultra sound please advise.

## 2013-11-20 NOTE — Telephone Encounter (Signed)
Maureen Morales  This ultrasound was ordered on the 17th.  i believe i signed an order for it yesterday? Please contact patient.  Thank you

## 2013-11-20 NOTE — Telephone Encounter (Signed)
Fyi.  I think this should have gone to you.

## 2013-11-20 NOTE — Telephone Encounter (Signed)
I spoke with the patient's daughter who has the information regarding her scan.  She is scheduled and the daughter and pt are aware of apt details. Thanks!

## 2013-11-25 ENCOUNTER — Ambulatory Visit: Payer: Self-pay | Admitting: Internal Medicine

## 2013-11-25 DIAGNOSIS — Q618 Other cystic kidney diseases: Secondary | ICD-10-CM | POA: Diagnosis not present

## 2013-11-25 DIAGNOSIS — K8020 Calculus of gallbladder without cholecystitis without obstruction: Secondary | ICD-10-CM | POA: Diagnosis not present

## 2013-11-25 DIAGNOSIS — R109 Unspecified abdominal pain: Secondary | ICD-10-CM | POA: Diagnosis not present

## 2013-11-25 DIAGNOSIS — K828 Other specified diseases of gallbladder: Secondary | ICD-10-CM | POA: Diagnosis not present

## 2013-11-25 DIAGNOSIS — N281 Cyst of kidney, acquired: Secondary | ICD-10-CM | POA: Diagnosis not present

## 2013-11-26 ENCOUNTER — Telehealth: Payer: Self-pay | Admitting: Internal Medicine

## 2013-11-26 DIAGNOSIS — R932 Abnormal findings on diagnostic imaging of liver and biliary tract: Secondary | ICD-10-CM

## 2013-11-26 NOTE — Telephone Encounter (Signed)
Patient notified and voiced understanding.

## 2013-11-26 NOTE — Telephone Encounter (Signed)
Her ultrasound showed  some sludge in the GB,  One 5 mm stone that was not blocking the duct,   They did mention  possible calcification of the GB wall, which was not seen on the 2012 CT of the abdomen,  This needs to be evaluated further with a CT scan so I am going to order a follow up CT and have her see Dr Vira Agar afterward for his opinion. .  Otherwise the rest of the organs were normal except for some benign appearing cysts on both kidnesy and a  Tiny nonobstructing  stone in the right kidney

## 2013-12-02 ENCOUNTER — Other Ambulatory Visit: Payer: Self-pay | Admitting: Internal Medicine

## 2013-12-02 ENCOUNTER — Telehealth: Payer: Self-pay | Admitting: Internal Medicine

## 2013-12-02 NOTE — Telephone Encounter (Signed)
Please advise of CT referral ordered 11/26/13. Referral done and order placed please advise.

## 2013-12-05 ENCOUNTER — Encounter: Payer: Self-pay | Admitting: Internal Medicine

## 2013-12-08 ENCOUNTER — Ambulatory Visit: Payer: Self-pay | Admitting: Internal Medicine

## 2013-12-08 DIAGNOSIS — R109 Unspecified abdominal pain: Secondary | ICD-10-CM | POA: Diagnosis not present

## 2013-12-08 DIAGNOSIS — K8020 Calculus of gallbladder without cholecystitis without obstruction: Secondary | ICD-10-CM | POA: Diagnosis not present

## 2013-12-08 DIAGNOSIS — K5730 Diverticulosis of large intestine without perforation or abscess without bleeding: Secondary | ICD-10-CM | POA: Diagnosis not present

## 2013-12-08 DIAGNOSIS — R933 Abnormal findings on diagnostic imaging of other parts of digestive tract: Secondary | ICD-10-CM | POA: Diagnosis not present

## 2013-12-10 ENCOUNTER — Other Ambulatory Visit: Payer: Self-pay | Admitting: Internal Medicine

## 2013-12-10 ENCOUNTER — Telehealth: Payer: Self-pay | Admitting: Internal Medicine

## 2013-12-10 DIAGNOSIS — K8020 Calculus of gallbladder without cholecystitis without obstruction: Secondary | ICD-10-CM

## 2013-12-10 DIAGNOSIS — R1084 Generalized abdominal pain: Secondary | ICD-10-CM

## 2013-12-10 DIAGNOSIS — R109 Unspecified abdominal pain: Secondary | ICD-10-CM

## 2013-12-10 NOTE — Telephone Encounter (Signed)
Patient stated that she has had the abdominal pain umbilical area, all night pain was rated a 10 on scale of 1-10. Today the pain is rated at a 7 patient did have a small BM this morning but still experiencing a lot of discomfort.  Patient stated she does not feel she empties

## 2013-12-10 NOTE — Telephone Encounter (Signed)
HIDA scan ordered.  Maureen Morales updated by Dr Derrel Nip ; wants to get Dr Ileene Hutchinson opinion about surgical referral.

## 2013-12-10 NOTE — Telephone Encounter (Signed)
Her CT showed some gallstones and a a partial blockage of the gallbladder neck that may be causing her symptoms.  The resit of her organ were normal except for some tiny cysts in the kidney which are nothing to worry about.,  I would like her to see a general surgeon about the gallbladder.  Who does she prefer?

## 2013-12-10 NOTE — Telephone Encounter (Signed)
Patient daughter is to call Md during lunch. Talked with patient and patient feels comfortable with MD decision.

## 2013-12-11 ENCOUNTER — Ambulatory Visit: Payer: Self-pay | Admitting: Internal Medicine

## 2013-12-11 DIAGNOSIS — R109 Unspecified abdominal pain: Secondary | ICD-10-CM | POA: Diagnosis not present

## 2013-12-11 DIAGNOSIS — R1084 Generalized abdominal pain: Secondary | ICD-10-CM | POA: Diagnosis not present

## 2013-12-11 DIAGNOSIS — K8020 Calculus of gallbladder without cholecystitis without obstruction: Secondary | ICD-10-CM | POA: Diagnosis not present

## 2013-12-15 ENCOUNTER — Ambulatory Visit (INDEPENDENT_AMBULATORY_CARE_PROVIDER_SITE_OTHER): Payer: Medicare Other | Admitting: Internal Medicine

## 2013-12-15 ENCOUNTER — Encounter: Payer: Self-pay | Admitting: Internal Medicine

## 2013-12-15 VITALS — BP 148/76 | HR 82 | Temp 98.6°F | Resp 16 | Ht 61.0 in | Wt 125.2 lb

## 2013-12-15 DIAGNOSIS — Z01818 Encounter for other preprocedural examination: Secondary | ICD-10-CM | POA: Insufficient documentation

## 2013-12-15 DIAGNOSIS — K8051 Calculus of bile duct without cholangitis or cholecystitis with obstruction: Secondary | ICD-10-CM

## 2013-12-15 DIAGNOSIS — K8050 Calculus of bile duct without cholangitis or cholecystitis without obstruction: Secondary | ICD-10-CM

## 2013-12-15 DIAGNOSIS — Z9049 Acquired absence of other specified parts of digestive tract: Secondary | ICD-10-CM | POA: Insufficient documentation

## 2013-12-15 LAB — COMPREHENSIVE METABOLIC PANEL
ALBUMIN: 4.1 g/dL (ref 3.5–5.2)
ALT: 29 U/L (ref 0–35)
AST: 32 U/L (ref 0–37)
Alkaline Phosphatase: 50 U/L (ref 39–117)
BUN: 29 mg/dL — ABNORMAL HIGH (ref 6–23)
CHLORIDE: 103 meq/L (ref 96–112)
CO2: 26 mEq/L (ref 19–32)
Calcium: 9.9 mg/dL (ref 8.4–10.5)
Creatinine, Ser: 1.1 mg/dL (ref 0.4–1.2)
GFR: 51.35 mL/min — ABNORMAL LOW (ref >60.00)
Glucose, Bld: 86 mg/dL (ref 70–99)
Potassium: 4.2 mEq/L (ref 3.5–5.1)
SODIUM: 139 meq/L (ref 135–145)
TOTAL PROTEIN: 6.8 g/dL (ref 6.0–8.3)
Total Bilirubin: 0.6 mg/dL (ref 0.2–1.2)

## 2013-12-15 LAB — CBC WITH DIFFERENTIAL/PLATELET
BASOS ABS: 0 10*3/uL (ref 0.0–0.1)
Basophils Relative: 0.5 % (ref 0.0–3.0)
Eosinophils Absolute: 0.2 10*3/uL (ref 0.0–0.7)
Eosinophils Relative: 2.5 % (ref 0.0–5.0)
HEMATOCRIT: 31.6 % — AB (ref 36.0–46.0)
Hemoglobin: 10.9 g/dL — ABNORMAL LOW (ref 12.0–15.0)
LYMPHS ABS: 2.1 10*3/uL (ref 0.7–4.0)
Lymphocytes Relative: 30.7 % (ref 12.0–46.0)
MCHC: 34.3 g/dL (ref 30.0–36.0)
MCV: 86.3 fl (ref 78.0–100.0)
MONO ABS: 0.5 10*3/uL (ref 0.1–1.0)
Monocytes Relative: 7.9 % (ref 3.0–12.0)
NEUTROS ABS: 3.9 10*3/uL (ref 1.4–7.7)
Neutrophils Relative %: 58.4 % (ref 43.0–77.0)
Platelets: 252 10*3/uL (ref 150.0–400.0)
RBC: 3.66 Mil/uL — ABNORMAL LOW (ref 3.87–5.11)
RDW: 13.1 % (ref 11.5–15.5)
WBC: 6.7 10*3/uL (ref 4.0–10.5)

## 2013-12-15 LAB — PROTIME-INR
INR: 1.2 ratio — ABNORMAL HIGH (ref 0.8–1.0)
Prothrombin Time: 12.8 s (ref 9.6–13.1)

## 2013-12-15 NOTE — Progress Notes (Signed)
Patient ID: Maureen Morales, female   DOB: 1923/03/22, 80 y.o.   MRN: [id number] Patient Active Problem List   Diagnosis Date Noted  . Choledocholithiasis 12/15/2013  . Preoperative general physical examination 12/15/2013  . Encounter for preventive health examination 11/13/2013  . Unspecified constipation 11/13/2013  . Cervical spine disease 01/27/2013  . Anemia, iron deficiency 01/21/2013  . Right medial knee pain 01/09/2013  . Back pain, thoracic 08/27/2012  . Feeling of chest tightness 08/07/2012  . Aortic stenosis, mild 05/06/2012  . Asthma 07/04/2011  . Hemorrhoids, internal 04/05/2011  . Nummular eczema 01/16/2011  . Hypertension   . Type II diabetes mellitus, well controlled   . Hyperlipidemia     Subjective:  CC:   Chief Complaint  Patient presents with  . Acute Visit    Gallbladder    HPI:   Maureen Morales is a 80 y.o. female who presents for Follow up on recurrent abdominal pain with recent imaging studies suggestive of choledocholithiasis with positive HIDA scan.,  She has remained pain free over the weekend on a low fat diet. Denies fever, nausea, jaundice and dark colored urine.  Her pain, when recurrent is referred to the right chest.  She has an tentative appt with Dr Pat Patrick for early next week but understands that sudden onset of severe pain or slowly progressive abd pain should be managed by going to th ER   She denies any history of angina, dyspnea, or claudication.  Has lost two lbs inc the last 2 months due to fear of eating.  Past Medical History  Diagnosis Date  . Hypertension   . Diabetes mellitus   . Hyperlipidemia   . Viral gastroenteritis due to [place]-like agent Nov 2012    Past Surgical History  Procedure Laterality Date  . Bladder surgery    . Abdominal hysterectomy    . Cardiac catheterization         The following portions of the patient's history were reviewed and updated as appropriate: Allergies, current medications, and problem  list.    Review of Systems:   Patient denies headache, fevers, malaise, unintentional weight loss, skin rash, eye pain, sinus congestion and sinus pain, sore throat, dysphagia,  hemoptysis , cough, dyspnea, wheezing, chest pain, palpitations, orthopnea, edema, abdominal pain, nausea, melena, diarrhea, constipation, flank pain, dysuria, hematuria, urinary  Frequency, nocturia, numbness, tingling, seizures,  Focal weakness, Loss of consciousness,  Tremor, insomnia, depression, anxiety, and suicidal ideation.     History   Social History  . Marital Status: Married    Spouse Name: N/A    Number of Children: N/A  . Years of Education: N/A   Occupational History  . Not on file.   Social History Main Topics  . Smoking status: Never Smoker   . Smokeless tobacco: Never Used  . Alcohol Use: No  . Drug Use: No  . Sexual Activity: Not on file   Other Topics Concern  . Not on file   Social History Narrative  . No narrative on file    Objective:  Filed Vitals:   12/15/13 1012  BP: 148/76  Pulse: 82  Temp: 98.6 F (37 C)  Resp: 16     General appearance: alert, cooperative and appears stated age Ears: normal TM's and external ear canals both ears Throat: lips, mucosa, and tongue normal; teeth and gums normal Neck: no adenopathy, no carotid bruit, supple, symmetrical, trachea midline and thyroid not enlarged, symmetric, no tenderness/mass/nodules Back: symmetric, no curvature. ROM normal.  No CVA tenderness. Lungs: clear to auscultation bilaterally Heart: regular rate and rhythm, S1, S2 normal, no murmur, click, rub or gallop Abdomen: soft, non-tender; bowel sounds normal; no masses,  no organomegaly Pulses: 2+ and symmetric Skin: Skin color, texture, turgor normal. No rashes or lesions Lymph nodes: Cervical, supraclavicular, and axillary nodes normal.  Assessment and Plan:  Choledocholithiasis She is asymptomatic currently but has had several episodes of severe abdominal  pain over the past month radiating to right chest and her HIDA scan was largely positive (GB not visualized at all).  Continue  low fat diet .  Dr Pat Patrick to see her next week.  Patient knows to go to ER if symptoms return.  CMET, PT, CBC and EKG done today for preoperative evaluation.   Lab Results  Component Value Date   ALT 29 12/15/2013   AST 32 12/15/2013   ALKPHOS 50 12/15/2013   BILITOT 0.6 12/15/2013   Lab Results  Component Value Date   WBC 6.7 12/15/2013   HGB 10.9* 12/15/2013   HCT 31.6* 12/15/2013   MCV 86.3 12/15/2013   PLT 252.0 12/15/2013   Lab Results  Component Value Date   NA 139 12/15/2013   K 4.2 12/15/2013   CL 103 12/15/2013   CO2 26 12/15/2013   Lab Results  Component Value Date   INR 1.2* 12/15/2013     Preoperative general physical examination Preoperative medical clearance for cholecystectomy done. She has a history of aortic stenosis but no history of  CAD and no history of angina. She has  DM Type 2 which is very  well controlled  and not complicated by nephropathy , neuropathy , or PAD.  [name]  She is considered low risk for perioperative complications.    Updated Medication List Outpatient Encounter Prescriptions as of 12/15/2013  Medication Sig  . calcium carbonate (TUMS) 500 MG chewable tablet Chew 1 tablet by mouth. Taken when needed  . Calcium-Mag-Vit C-Vit D 185-28-2.5-200 CAPS Take 2 tablets by mouth daily.  . cetirizine (ZYRTEC) 10 MG tablet Take 10 mg by mouth daily.  . cholecalciferol (VITAMIN D) 1000 UNITS tablet Take 1,000 Units by mouth daily.    . clobetasol cream (TEMOVATE) 1.76 % Apply 1 application topically at bedtime.  . fenofibrate 160 MG tablet TAKE 1 TABLET EVERY DAY FOR HIGH TRIGLYCERIDES  . glucose blood (BAYER CONTOUR TEST) test strip Use as instructed  . ibuprofen (ADVIL,MOTRIN) 200 MG tablet Take 200 mg by mouth as needed.    . Lactulose 20 GM/30ML SOLN Take 30 mLs (20 g total) by mouth as needed. To releive constipation  . metFORMIN  (GLUCOPHAGE-XR) 750 MG 24 hr tablet TAKE 1 TABLET EVERY DAY WITH BREAKFAST  . metoprolol succinate (TOPROL-XL) 25 MG 24 hr tablet TAKE 1 TABLET EVERY DAY  . Multiple Vitamins-Minerals (ABC PLUS SENIOR PO) Take by mouth.    . Omega-3 Fatty Acids (FISH OIL) 1200 MG CAPS Take by mouth.    . Probiotic Product (PHILLIPS COLON HEALTH PO) Take 1 tablet by mouth daily.    [name] triamcinolone ointment (KENALOG) 0.5 % Apply 1 application topically 2 (two) times daily. As needed for itching  . valsartan-hydrochlorothiazide (DIOVAN-HCT) 320-25 MG per tablet TAKE 1 TABLET EVERY DAY USUALLY IN THE MORNING  . BIOTIN 5000 PO Take by mouth.    . TDaP (BOOSTRIX) 5-2.5-18.5 LF-MCG/0.5 injection Inject 0.5 mLs into the muscle once.

## 2013-12-15 NOTE — Assessment & Plan Note (Addendum)
She is asymptomatic currently but has had several episodes of severe abdominal pain over the past month radiating to right chest and her HIDA scan was largely positive (GB not visualized at all).  Continue  low fat diet .  Dr Pat Patrick to see her next week.  Patient knows to go to ER if symptoms return.  CMET, PT, CBC and EKG done today for preoperative evaluation.   Lab Results  Component Value Date   ALT 29 12/15/2013   AST 32 12/15/2013   ALKPHOS 50 12/15/2013   BILITOT 0.6 12/15/2013   Lab Results  Component Value Date   WBC 6.7 12/15/2013   HGB 10.9* 12/15/2013   HCT 31.6* 12/15/2013   MCV 86.3 12/15/2013   PLT 252.0 12/15/2013   Lab Results  Component Value Date   NA 139 12/15/2013   K 4.2 12/15/2013   CL 103 12/15/2013   CO2 26 12/15/2013   Lab Results  Component Value Date   INR 1.2* 12/15/2013

## 2013-12-15 NOTE — Progress Notes (Signed)
Pre-visit discussion using our clinic review tool. No additional management support is needed unless otherwise documented below in the visit note.  

## 2013-12-15 NOTE — Assessment & Plan Note (Signed)
Preoperative medical clearance for cholecystectomy done. She has a history of aortic stenosis but no history of  CAD and no history of angina. She has  DM Type 2 which is very  well controlled  and not complicated by nephropathy , neuropathy , or PAD.  [name]  She is considered low risk for perioperative complications.

## 2013-12-15 NOTE — Patient Instructions (Signed)
We have cancelled the appt with Dr Rexene Edison  We did your preoperative evaluation today so Dr Pat Patrick doesn't have to ask for it when he sees you  Please continue a low fat diet to keep your gallbladder unperturbed this week   If you develop severe abdominal pain,  Go directly to the ER and tell them you are Dr Rolin Barry patient.

## 2013-12-23 ENCOUNTER — Encounter: Payer: Self-pay | Admitting: Internal Medicine

## 2013-12-23 DIAGNOSIS — K8020 Calculus of gallbladder without cholecystitis without obstruction: Secondary | ICD-10-CM | POA: Diagnosis not present

## 2013-12-25 ENCOUNTER — Inpatient Hospital Stay: Payer: Self-pay | Admitting: Surgery

## 2013-12-25 DIAGNOSIS — K8050 Calculus of bile duct without cholangitis or cholecystitis without obstruction: Secondary | ICD-10-CM | POA: Diagnosis not present

## 2013-12-25 DIAGNOSIS — K8010 Calculus of gallbladder with chronic cholecystitis without obstruction: Secondary | ICD-10-CM | POA: Diagnosis not present

## 2013-12-25 DIAGNOSIS — K8020 Calculus of gallbladder without cholecystitis without obstruction: Secondary | ICD-10-CM | POA: Diagnosis not present

## 2013-12-26 ENCOUNTER — Encounter: Payer: Self-pay | Admitting: Internal Medicine

## 2013-12-26 LAB — PATHOLOGY REPORT

## 2014-01-06 ENCOUNTER — Other Ambulatory Visit: Payer: Self-pay | Admitting: Internal Medicine

## 2014-01-23 DIAGNOSIS — Z23 Encounter for immunization: Secondary | ICD-10-CM | POA: Diagnosis not present

## 2014-02-04 ENCOUNTER — Other Ambulatory Visit: Payer: Self-pay | Admitting: Internal Medicine

## 2014-02-17 ENCOUNTER — Encounter: Payer: Self-pay | Admitting: Cardiovascular Disease

## 2014-02-17 ENCOUNTER — Telehealth: Payer: Self-pay | Admitting: *Deleted

## 2014-02-17 ENCOUNTER — Ambulatory Visit (INDEPENDENT_AMBULATORY_CARE_PROVIDER_SITE_OTHER): Payer: Medicare Other | Admitting: Cardiovascular Disease

## 2014-02-17 VITALS — BP 140/70 | HR 74 | Ht 62.5 in | Wt 127.2 lb

## 2014-02-17 DIAGNOSIS — R0789 Other chest pain: Secondary | ICD-10-CM | POA: Insufficient documentation

## 2014-02-17 DIAGNOSIS — I10 Essential (primary) hypertension: Secondary | ICD-10-CM

## 2014-02-17 DIAGNOSIS — R079 Chest pain, unspecified: Secondary | ICD-10-CM

## 2014-02-17 DIAGNOSIS — I350 Nonrheumatic aortic (valve) stenosis: Secondary | ICD-10-CM | POA: Diagnosis not present

## 2014-02-17 NOTE — Telephone Encounter (Signed)
Patient called and stated she had gallbladder surgery on 01/13/14 She has been having right sided chest pain above right breast  She is worried about her blood pressure it has been elevated SBP>180 She would like to see Dr. Fletcher Anon asap  Patient scheduled for today

## 2014-02-17 NOTE — Assessment & Plan Note (Signed)
Blood pressure is well controlled on current medications. It was elevated this morning but it appears that it was provoked by anxiety. Continue to monitor blood pressure and consider increasing the dose of Toprol to 50 mg once daily.

## 2014-02-17 NOTE — Assessment & Plan Note (Signed)
Right-sided atypical chest pain does not seem to be cardiac in nature. EKG is normal. Aortic stenosis is known to be mild the patient was reassured.

## 2014-02-17 NOTE — Patient Instructions (Signed)
Continue same medications.   Your physician wants you to follow-up in: 12 months.  You will receive a reminder letter in the mail two months in advance. If you don't receive a letter, please call our office to schedule the follow-up appointment.  Your next appointment will be scheduled in our new office located at :  [facility]  [address] [place]  [place], [place] [address]

## 2014-02-17 NOTE — Assessment & Plan Note (Signed)
Will obtain a followup echocardiogram in February of 2017.

## 2014-02-17 NOTE — Telephone Encounter (Signed)
Patient calling with chest pains.  She had gallbladder surgery on 9/15

## 2014-02-17 NOTE — Progress Notes (Signed)
HPI  This is a very pleasant 80 year old female who is here for a follow up regarding aortic stenosis.  She has been relatively healthy throughout her life but does have hypertension, hyperlipidemia and type 2 diabetes.  Most recent echocardiogram in February showed stable mild aortic stenosis with normal LV systolic function. She had recent cholecystectomy. She continued to have right-sided chest discomfort and she became worried about this. The pain is only on the right side and described as aching that happens at rest. She denies any exertional symptoms. She was very anxious today and checked her blood pressure and was 831 systolic. She has been under significant stress as her husband's health conditions continue to deteriorate.  Allergies  Allergen Reactions  . Aspirin   . Atropine   . Belladonna Alkaloids   . Codeine   . Darvon   . Methocarbamol   . Penicillins   . Iron Other (See Comments)    High dose prescription gives her diarrhea     Current Outpatient Prescriptions on File Prior to Visit  Medication Sig Dispense Refill  . BIOTIN 5000 PO Take by mouth.        . calcium carbonate (TUMS) 500 MG chewable tablet Chew 1 tablet by mouth. Taken when needed      . cetirizine (ZYRTEC) 10 MG tablet Take 10 mg by mouth as needed.       . cholecalciferol (VITAMIN D) 1000 UNITS tablet Take 1,000 Units by mouth daily.        . fenofibrate 160 MG tablet TAKE 1 TABLET EVERY DAY FOR HIGH TRIGLYCERIDES  30 tablet  3  . glucose blood (BAYER CONTOUR TEST) test strip Use as instructed  100 each  5  . ibuprofen (ADVIL,MOTRIN) 200 MG tablet Take 200 mg by mouth as needed.        . Lactulose 20 GM/30ML SOLN Take 30 mLs (20 g total) by mouth as needed. To releive constipation  240 mL  3  . metFORMIN (GLUCOPHAGE-XR) 750 MG 24 hr tablet TAKE 1 TABLET EVERY DAY WITH BREAKFAST  30 tablet  5  . metoprolol succinate (TOPROL-XL) 25 MG 24 hr tablet TAKE 1 TABLET EVERY DAY  30 tablet  6  . Multiple  Vitamins-Minerals (ABC PLUS SENIOR PO) Take by mouth.        . Omega-3 Fatty Acids (FISH OIL) 1200 MG CAPS Take by mouth.        . Probiotic Product (PHILLIPS COLON HEALTH PO) Take 1 tablet by mouth daily.        . TDaP (BOOSTRIX) 5-2.5-18.5 LF-MCG/0.5 injection Inject 0.5 mLs into the muscle once.  0.5 mL  0  . valsartan-hydrochlorothiazide (DIOVAN-HCT) 320-25 MG per tablet TAKE 1 TABLET EVERY DAY USUALLY IN THE MORNING  30 tablet  11   No current facility-administered medications on file prior to visit.     Past Medical History  Diagnosis Date  . Hypertension   . Diabetes mellitus   . Hyperlipidemia   . Viral gastroenteritis due to Norwalk-like agent Nov 2012     Past Surgical History  Procedure Laterality Date  . Bladder surgery    . Abdominal hysterectomy    . Cardiac catheterization    . Cholecystectomy       Family History  Problem Relation Age of Onset  . Cancer Mother      History   Social History  . Marital Status: Married    Spouse Name: N/A    Number  of Children: N/A  . Years of Education: N/A   Occupational History  . Not on file.   Social History Main Topics  . Smoking status: Never Smoker   . Smokeless tobacco: Never Used  . Alcohol Use: No  . Drug Use: No  . Sexual Activity: Not on file   Other Topics Concern  . Not on file   Social History Narrative  . No narrative on file     ROS Constitutional: Negative for fever, chills, diaphoresis, activity change, appetite change and fatigue.  HENT: Negative for hearing loss, nosebleeds, congestion, sore throat, facial swelling, drooling, trouble swallowing, neck pain, voice change, sinus pressure and tinnitus.  Eyes: Negative for photophobia, pain, discharge and visual disturbance.  Respiratory: Negative for apnea, cough, chest tightness, shortness of breath and wheezing.  Cardiovascular: Negative for chest pain, palpitations and leg swelling.  Gastrointestinal: Negative for nausea, vomiting,  abdominal pain, diarrhea, constipation, blood in stool and abdominal distention.  Genitourinary: Negative for dysuria, urgency, frequency, hematuria and decreased urine volume.  Musculoskeletal: Negative for myalgias, back pain, joint swelling, arthralgias and gait problem.  Skin: Negative for color change, pallor, rash and wound.  Neurological: Negative for dizziness, tremors, seizures, syncope, speech difficulty, weakness, light-headedness, numbness and headaches.  Psychiatric/Behavioral: Negative for suicidal ideas, hallucinations, behavioral problems and agitation. The patient is not nervous/anxious.     PHYSICAL EXAM   BP 140/70  Pulse 74  Ht 5' 2.5" (1.588 m)  Wt 127 lb 4 oz (57.72 kg)  BMI 22.89 kg/m2 Constitutional: She is oriented to person, place, and time. She appears well-developed and well-nourished. No distress.  HENT: No nasal discharge.  Head: Normocephalic and atraumatic.  Eyes: Pupils are equal and round. Right eye exhibits no discharge. Left eye exhibits no discharge.  Neck: Normal range of motion. Neck supple. No JVD present. No thyromegaly present.  Cardiovascular: Normal rate, regular rhythm. Exam reveals no gallop and no friction rub. There is a 3/6 crescendo decrescendo murmur in the aortic area which is mid peaking with slightly diminished S2. Pulmonary/Chest: Effort normal and breath sounds normal. No stridor. No respiratory distress. She has no wheezes. She has no rales. She exhibits no tenderness.  Abdominal: Soft. Bowel sounds are normal. She exhibits no distension. There is no tenderness. There is no rebound and no guarding.  Musculoskeletal: Normal range of motion. She exhibits no edema and no tenderness.  Neurological: She is alert and oriented to person, place, and time. Coordination normal.  Skin: Skin is warm and dry. No rash noted. She is not diaphoretic. No erythema. No pallor.  Psychiatric: She has a normal mood and affect. Her behavior is normal.  Judgment and thought content normal.     EKG: Sinus  Rhythm  Low voltage in precordial leads.   ABNORMAL    ASSESSMENT AND PLAN

## 2014-02-19 ENCOUNTER — Ambulatory Visit: Payer: Medicare Other | Admitting: Cardiovascular Disease

## 2014-03-25 ENCOUNTER — Encounter: Payer: Self-pay | Admitting: Internal Medicine

## 2014-03-25 ENCOUNTER — Encounter (INDEPENDENT_AMBULATORY_CARE_PROVIDER_SITE_OTHER): Payer: Self-pay

## 2014-03-25 ENCOUNTER — Ambulatory Visit (INDEPENDENT_AMBULATORY_CARE_PROVIDER_SITE_OTHER): Payer: Medicare Other | Admitting: Internal Medicine

## 2014-03-25 VITALS — BP 140/60 | HR 73 | Temp 98.0°F | Resp 14 | Ht 62.0 in | Wt 125.5 lb

## 2014-03-25 DIAGNOSIS — E559 Vitamin D deficiency, unspecified: Secondary | ICD-10-CM | POA: Diagnosis not present

## 2014-03-25 DIAGNOSIS — E119 Type 2 diabetes mellitus without complications: Secondary | ICD-10-CM | POA: Diagnosis not present

## 2014-03-25 DIAGNOSIS — Z9889 Other specified postprocedural states: Secondary | ICD-10-CM | POA: Diagnosis not present

## 2014-03-25 DIAGNOSIS — Z9049 Acquired absence of other specified parts of digestive tract: Secondary | ICD-10-CM

## 2014-03-25 DIAGNOSIS — I10 Essential (primary) hypertension: Secondary | ICD-10-CM | POA: Diagnosis not present

## 2014-03-25 DIAGNOSIS — D509 Iron deficiency anemia, unspecified: Secondary | ICD-10-CM | POA: Diagnosis not present

## 2014-03-25 DIAGNOSIS — E785 Hyperlipidemia, unspecified: Secondary | ICD-10-CM

## 2014-03-25 LAB — HM DIABETES FOOT EXAM: HM Diabetic Foot Exam: NORMAL

## 2014-03-25 NOTE — Progress Notes (Signed)
Patient ID: Maureen Morales, female   DOB: Nov 30, 1922, 80 y.o.   MRN: [id number]  Patient Active Problem List   Diagnosis Date Noted  . Atypical chest pain 02/17/2014  . S/P laparoscopic cholecystectomy 12/15/2013  . Preoperative general physical examination 12/15/2013  . Encounter for preventive health examination 11/13/2013  . Unspecified constipation 11/13/2013  . Cervical spine disease 01/27/2013  . Anemia, iron deficiency 01/21/2013  . Right medial knee pain 01/09/2013  . Back pain, thoracic 08/27/2012  . Feeling of chest tightness 08/07/2012  . Aortic stenosis, mild 05/06/2012  . Asthma 07/04/2011  . Hemorrhoids, internal 04/05/2011  . Nummular eczema 01/16/2011  . Hypertension   . Type II diabetes mellitus, well controlled   . Hyperlipidemia     Subjective:  CC:   Chief Complaint  Patient presents with  . Follow-up    3 month post surgery    HPI:   Maureen Morales is a 80 y.o. female who presents for   3 MONTH FOLLOW UP ON DM. HYPERTENSION AND HYPERLIPIDEMIA  She is S/p cholecystectomy in late August after workup for recurrent abdominal pain revealed a  Blocked cystic duet.  .  Surgery was uneventful and patient was dc'd home after a few days.  She is following  Mediterranean style diet,  Has lost 2 lbs and repeats a very good appetite.  She reports fasting sugars in the 130 to 140 range.   She is physically very active.  Sleeping in a separate room now from husband [name] due to his nighttime urinary frequency .  Has caregivers most hours of the day and night to help provide care given his dementia, so she denies fatigue.     Past Medical History  Diagnosis Date  . Hypertension   . Diabetes mellitus   . Hyperlipidemia   . Viral gastroenteritis due to Norwalk-like agent Nov 2012    Past Surgical History  Procedure Laterality Date  . Bladder surgery    . Abdominal hysterectomy    . Cardiac catheterization    . Cholecystectomy         The following portions  of the patient's history were reviewed and updated as appropriate: Allergies, current medications, and problem list.    Review of Systems:   Patient denies headache, fevers, malaise, unintentional weight loss, skin rash, eye pain, sinus congestion and sinus pain, sore throat, dysphagia,  hemoptysis , cough, dyspnea, wheezing, chest pain, palpitations, orthopnea, edema, abdominal pain, nausea, melena, diarrhea, constipation, flank pain, dysuria, hematuria, urinary  Frequency, nocturia, numbness, tingling, seizures,  Focal weakness, Loss of consciousness,  Tremor, insomnia, depression, anxiety, and suicidal ideation.     History   Social History  . Marital Status: Married    Spouse Name: N/A    Number of Children: N/A  . Years of Education: N/A   Occupational History  . Not on file.   Social History Main Topics  . Smoking status: Never Smoker   . Smokeless tobacco: Never Used  . Alcohol Use: No  . Drug Use: No  . Sexual Activity: Not on file   Other Topics Concern  . Not on file   Social History Narrative    Objective:  Filed Vitals:   03/25/14 1109  BP: 140/60  Pulse: 73  Temp: 98 F (36.7 C)  Resp: 14     General appearance: alert, cooperative and appears stated age Ears: normal TM's and external ear canals both ears Throat: lips, mucosa, and tongue normal; teeth and gums  normal Neck: no adenopathy, no carotid bruit, supple, symmetrical, trachea midline and thyroid not enlarged, symmetric, no tenderness/mass/nodules Back: symmetric, no curvature. ROM normal. No CVA tenderness. Lungs: clear to auscultation bilaterally Heart: regular rate and rhythm, S1, S2 normal, no murmur, click, rub or gallop Abdomen: soft, non-tender; bowel sounds normal; no masses,  no organomegaly Pulses: 2+ and symmetric Skin: Skin color, texture, turgor normal. No rashes or lesions Lymph nodes: Cervical, supraclavicular, and axillary nodes normal.  Assessment and  Plan:  Hypertension Well controlled on current regimen. Renal function has been  stable, no changes today.  Lab Results  Component Value Date   CREATININE 1.1 12/15/2013   Lab Results  Component Value Date   NA 139 12/15/2013   K 4.2 12/15/2013   CL 103 12/15/2013   CO2 26 12/15/2013     S/P laparoscopic cholecystectomy Surgery was done in August with no complications At [facility].   Type II diabetes mellitus, well controlled Historically well-controlled on current medications.  hemoglobin A1c has been consistently at or  less than 7.0 . Patient is up-to-date on eye exams and foot exam is normal today. Patient is due for urine microalbumin to creatinine ratio and a1c. Patient is not on statin therapy for CAD risk reduction given her age and is tolerating  ACE/ARB .   Anemia, iron deficiency Mild, hgb 11.2 FOBT was positive and she was referred to GI, who deferred colonoscopy due to age.  Repeat CBC will be done with next lab draw.   Lab Results  Component Value Date   WBC 6.7 12/15/2013   HGB 10.9* 12/15/2013   HCT 31.6* 12/15/2013   MCV 86.3 12/15/2013   PLT 252.0 12/15/2013     Hyperlipidemia LDL and triglycerides are at goal without statin therapy, given her age.   Lab Results  Component Value Date   CHOL 151 04/29/2013   HDL 46.20 04/29/2013   LDLCALC 77 04/29/2013   LDLDIRECT 75.5 11/13/2013   TRIG 138.0 04/29/2013   CHOLHDL 3 04/29/2013      Updated Medication List Outpatient Encounter Prescriptions as of 03/25/2014  Medication Sig  . calcium carbonate (TUMS) 500 MG chewable tablet Chew 1 tablet by mouth. Taken when needed  . fenofibrate 160 MG tablet TAKE 1 TABLET EVERY DAY FOR HIGH TRIGLYCERIDES  . glucose blood (BAYER CONTOUR TEST) test strip Use as instructed  . ibuprofen (ADVIL,MOTRIN) 200 MG tablet Take 200 mg by mouth as needed.    . metFORMIN (GLUCOPHAGE-XR) 750 MG 24 hr tablet TAKE 1 TABLET EVERY DAY WITH BREAKFAST  . metoprolol succinate  (TOPROL-XL) 25 MG 24 hr tablet TAKE 1 TABLET EVERY DAY  . Multiple Vitamins-Minerals (ABC PLUS SENIOR PO) Take by mouth.    . Omega-3 Fatty Acids (FISH OIL) 1200 MG CAPS Take by mouth.    . Probiotic Product (PHILLIPS COLON HEALTH PO) Take 1 tablet by mouth daily.    . valsartan-hydrochlorothiazide (DIOVAN-HCT) 320-25 MG per tablet TAKE 1 TABLET EVERY DAY USUALLY IN THE MORNING  . BIOTIN 5000 PO Take by mouth.    . Calcium Carb-Cholecalciferol (CALCIUM 600 + D PO) Take by mouth daily.  . cetirizine (ZYRTEC) 10 MG tablet Take 10 mg by mouth as needed.   . cholecalciferol (VITAMIN D) 1000 UNITS tablet Take 1,000 Units by mouth daily.    [name] estrogens, conjugated, (PREMARIN) 0.3 MG tablet Take 0.3 mg by mouth daily. Take daily for 21 days then do not take for 7 days.  . Lactulose 20 GM/30ML  SOLN Take 30 mLs (20 g total) by mouth as needed. To releive constipation (Patient not taking: Reported on 03/25/2014)  . TDaP (BOOSTRIX) 5-2.5-18.5 LF-MCG/0.5 injection Inject 0.5 mLs into the muscle once. (Patient not taking: Reported on 03/25/2014)     Orders Placed This Encounter  Procedures  . Comprehensive metabolic panel  . Hemoglobin A1c  . Lipid panel  . Microalbumin / creatinine urine ratio  . CBC with Differential  . Vit D  25 hydroxy (rtn osteoporosis monitoring)  . Vitamin B12  . Ferritin  . Iron and TIBC  . HM DIABETES EYE EXAM  . HM DIABETES FOOT EXAM    No Follow-up on file.

## 2014-03-27 NOTE — Assessment & Plan Note (Signed)
Surgery was done in August with no complications At [facility].

## 2014-03-27 NOTE — Assessment & Plan Note (Addendum)
Historically well-controlled on current medications.  hemoglobin A1c has been consistently at or  less than 7.0 . Patient is up-to-date on eye exams and foot exam is normal today. Patient is due for urine microalbumin to creatinine ratio and a1c. Patient is not on statin therapy for CAD risk reduction given her age and is tolerating  ACE/ARB .

## 2014-03-27 NOTE — Assessment & Plan Note (Signed)
LDL and triglycerides are at goal without statin therapy, given her age.   Lab Results  Component Value Date   CHOL 151 04/29/2013   HDL 46.20 04/29/2013   LDLCALC 77 04/29/2013   LDLDIRECT 75.5 11/13/2013   TRIG 138.0 04/29/2013   CHOLHDL 3 04/29/2013

## 2014-03-27 NOTE — Assessment & Plan Note (Signed)
Mild, hgb 11.2 FOBT was positive and she was referred to GI, who deferred colonoscopy due to age.  Repeat CBC will be done with next lab draw.   Lab Results  Component Value Date   WBC 6.7 12/15/2013   HGB 10.9* 12/15/2013   HCT 31.6* 12/15/2013   MCV 86.3 12/15/2013   PLT 252.0 12/15/2013

## 2014-03-27 NOTE — Assessment & Plan Note (Signed)
Well controlled on current regimen. Renal function has been  stable, no changes today.  Lab Results  Component Value Date   CREATININE 1.1 12/15/2013   Lab Results  Component Value Date   NA 139 12/15/2013   K 4.2 12/15/2013   CL 103 12/15/2013   CO2 26 12/15/2013

## 2014-04-09 ENCOUNTER — Telehealth: Payer: Self-pay | Admitting: Internal Medicine

## 2014-04-09 ENCOUNTER — Other Ambulatory Visit (INDEPENDENT_AMBULATORY_CARE_PROVIDER_SITE_OTHER): Payer: Medicare Other

## 2014-04-09 DIAGNOSIS — E119 Type 2 diabetes mellitus without complications: Secondary | ICD-10-CM

## 2014-04-09 DIAGNOSIS — E559 Vitamin D deficiency, unspecified: Secondary | ICD-10-CM

## 2014-04-09 DIAGNOSIS — D509 Iron deficiency anemia, unspecified: Secondary | ICD-10-CM

## 2014-04-09 DIAGNOSIS — E785 Hyperlipidemia, unspecified: Secondary | ICD-10-CM | POA: Diagnosis not present

## 2014-04-09 LAB — LIPID PANEL
CHOLESTEROL: 163 mg/dL (ref 0–200)
HDL: 51.6 mg/dL (ref >39.00)
LDL CALC: 81 mg/dL (ref 0–99)
NonHDL: 111.4
TRIGLYCERIDES: 153 mg/dL — AB (ref 0.0–149.0)
Total CHOL/HDL Ratio: 3
VLDL: 30.6 mg/dL (ref 0.0–40.0)

## 2014-04-09 LAB — IRON AND TIBC
%SAT: 15 % — ABNORMAL LOW (ref 20–55)
IRON: 51 ug/dL (ref 42–145)
TIBC: 343 ug/dL (ref 250–470)
UIBC: 292 ug/dL (ref 125–400)

## 2014-04-09 LAB — COMPREHENSIVE METABOLIC PANEL
ALT: 36 U/L — ABNORMAL HIGH (ref 0–35)
AST: 32 U/L (ref 0–37)
Albumin: 4.1 g/dL (ref 3.5–5.2)
Alkaline Phosphatase: 55 U/L (ref 39–117)
BUN: 38 mg/dL — AB (ref 6–23)
CHLORIDE: 102 meq/L (ref 96–112)
CO2: 27 mEq/L (ref 19–32)
CREATININE: 1.1 mg/dL (ref 0.4–1.2)
Calcium: 9.7 mg/dL (ref 8.4–10.5)
GFR: 48.18 mL/min — ABNORMAL LOW (ref >60.00)
GLUCOSE: 152 mg/dL — AB (ref 70–99)
Potassium: 4.2 mEq/L (ref 3.5–5.1)
Sodium: 137 mEq/L (ref 135–145)
Total Bilirubin: 0.6 mg/dL (ref 0.2–1.2)
Total Protein: 6.5 g/dL (ref 6.0–8.3)

## 2014-04-09 LAB — CBC WITH DIFFERENTIAL/PLATELET
BASOS PCT: 0.4 % (ref 0.0–3.0)
Basophils Absolute: 0 10*3/uL (ref 0.0–0.1)
Eosinophils Absolute: 0.2 10*3/uL (ref 0.0–0.7)
Eosinophils Relative: 2.9 % (ref 0.0–5.0)
HCT: 32.1 % — ABNORMAL LOW (ref 36.0–46.0)
HEMOGLOBIN: 10.8 g/dL — AB (ref 12.0–15.0)
LYMPHS PCT: 21.3 % (ref 12.0–46.0)
Lymphs Abs: 1.9 10*3/uL (ref 0.7–4.0)
MCHC: 33.8 g/dL (ref 30.0–36.0)
MCV: 87 fl (ref 78.0–100.0)
Monocytes Absolute: 0.6 10*3/uL (ref 0.1–1.0)
Monocytes Relative: 7.2 % (ref 3.0–12.0)
Neutro Abs: 6 10*3/uL (ref 1.4–7.7)
Neutrophils Relative %: 68.2 % (ref 43.0–77.0)
Platelets: 249 10*3/uL (ref 150.0–400.0)
RBC: 3.69 Mil/uL — ABNORMAL LOW (ref 3.87–5.11)
RDW: 13.1 % (ref 11.5–15.5)
WBC: 8.7 10*3/uL (ref 4.0–10.5)

## 2014-04-09 LAB — FERRITIN: FERRITIN: 70.8 ng/mL (ref 10.0–291.0)

## 2014-04-09 LAB — VITAMIN B12: VITAMIN B 12: 606 pg/mL (ref 211–911)

## 2014-04-09 LAB — MICROALBUMIN / CREATININE URINE RATIO
CREATININE, U: 60.3 mg/dL
MICROALB UR: 4.4 mg/dL — AB (ref 0.0–1.9)
MICROALB/CREAT RATIO: 7.3 mg/g (ref 0.0–30.0)

## 2014-04-09 LAB — HEMOGLOBIN A1C: Hgb A1c MFr Bld: 7.1 % — ABNORMAL HIGH (ref 4.6–6.5)

## 2014-04-09 LAB — VITAMIN D 25 HYDROXY (VIT D DEFICIENCY, FRACTURES): VITD: 42.69 ng/mL (ref 30.00–100.00)

## 2014-04-09 NOTE — Telephone Encounter (Signed)
Called patient to schedule lab patient wanted to make you aware fasting CBG = 195 today and last night bed time patient could only tell me was almost 300. FYI

## 2014-04-09 NOTE — Telephone Encounter (Signed)
Ok ,  I will review her a1.  Let me know if this  becomes a persistent pattern

## 2014-04-13 ENCOUNTER — Encounter: Payer: Self-pay | Admitting: *Deleted

## 2014-04-16 ENCOUNTER — Other Ambulatory Visit: Payer: Self-pay | Admitting: Internal Medicine

## 2014-05-09 ENCOUNTER — Other Ambulatory Visit: Payer: Self-pay | Admitting: Internal Medicine

## 2014-06-02 DIAGNOSIS — D2272 Melanocytic nevi of left lower limb, including hip: Secondary | ICD-10-CM | POA: Diagnosis not present

## 2014-06-02 DIAGNOSIS — L989 Disorder of the skin and subcutaneous tissue, unspecified: Secondary | ICD-10-CM | POA: Diagnosis not present

## 2014-06-02 DIAGNOSIS — B001 Herpesviral vesicular dermatitis: Secondary | ICD-10-CM | POA: Diagnosis not present

## 2014-06-02 DIAGNOSIS — L821 Other seborrheic keratosis: Secondary | ICD-10-CM | POA: Diagnosis not present

## 2014-06-02 DIAGNOSIS — D2262 Melanocytic nevi of left upper limb, including shoulder: Secondary | ICD-10-CM | POA: Diagnosis not present

## 2014-06-02 DIAGNOSIS — L570 Actinic keratosis: Secondary | ICD-10-CM | POA: Diagnosis not present

## 2014-06-04 ENCOUNTER — Telehealth: Payer: Self-pay | Admitting: Internal Medicine

## 2014-06-04 ENCOUNTER — Other Ambulatory Visit: Payer: Self-pay | Admitting: Internal Medicine

## 2014-06-04 NOTE — Telephone Encounter (Signed)
Maureen Morales wants a follow up appt,   She's due on or after Feb 25th (last visit was November )

## 2014-06-04 NOTE — Telephone Encounter (Signed)
Patient scheduled for 07/08/14 at [facility]

## 2014-06-09 DIAGNOSIS — B001 Herpesviral vesicular dermatitis: Secondary | ICD-10-CM | POA: Diagnosis not present

## 2014-06-11 DIAGNOSIS — H6123 Impacted cerumen, bilateral: Secondary | ICD-10-CM | POA: Diagnosis not present

## 2014-06-11 DIAGNOSIS — M1990 Unspecified osteoarthritis, unspecified site: Secondary | ICD-10-CM | POA: Diagnosis not present

## 2014-06-24 ENCOUNTER — Ambulatory Visit (INDEPENDENT_AMBULATORY_CARE_PROVIDER_SITE_OTHER)
Admission: RE | Admit: 2014-06-24 | Discharge: 2014-06-24 | Disposition: A | Discharge status: Home / Self Care | Payer: Medicare Other | Source: Ambulatory Visit | Attending: Internal Medicine | Admitting: Internal Medicine

## 2014-06-24 ENCOUNTER — Other Ambulatory Visit: Payer: Self-pay | Admitting: Internal Medicine

## 2014-06-24 DIAGNOSIS — M25571 Pain in right ankle and joints of right foot: Secondary | ICD-10-CM

## 2014-06-24 DIAGNOSIS — M25579 Pain in unspecified ankle and joints of unspecified foot: Secondary | ICD-10-CM | POA: Insufficient documentation

## 2014-06-24 DIAGNOSIS — M79671 Pain in right foot: Secondary | ICD-10-CM | POA: Diagnosis not present

## 2014-06-24 DIAGNOSIS — S99921A Unspecified injury of right foot, initial encounter: Secondary | ICD-10-CM | POA: Diagnosis not present

## 2014-06-24 DIAGNOSIS — M7989 Other specified soft tissue disorders: Secondary | ICD-10-CM | POA: Diagnosis not present

## 2014-06-24 MED ORDER — HYDROCODONE-ACETAMINOPHEN 5-325 MG PO TABS: 1.0000 | ORAL_TABLET | Freq: Four times a day (QID) | ORAL | Status: DC | PRN

## 2014-06-24 NOTE — Progress Notes (Addendum)
Placed script at front desk, notified patient ready for pick up

## 2014-06-26 DIAGNOSIS — S93601A Unspecified sprain of right foot, initial encounter: Secondary | ICD-10-CM | POA: Diagnosis not present

## 2014-06-26 DIAGNOSIS — M79671 Pain in right foot: Secondary | ICD-10-CM | POA: Diagnosis not present

## 2014-06-29 DIAGNOSIS — E119 Type 2 diabetes mellitus without complications: Secondary | ICD-10-CM | POA: Diagnosis not present

## 2014-06-30 LAB — HM DIABETES EYE EXAM

## 2014-07-08 ENCOUNTER — Encounter: Payer: Self-pay | Admitting: Internal Medicine

## 2014-07-08 ENCOUNTER — Ambulatory Visit (INDEPENDENT_AMBULATORY_CARE_PROVIDER_SITE_OTHER): Payer: Medicare Other | Admitting: Internal Medicine

## 2014-07-08 VITALS — BP 128/78 | HR 75 | Temp 98.4°F | Resp 16 | Ht 62.0 in | Wt 125.5 lb

## 2014-07-08 DIAGNOSIS — E559 Vitamin D deficiency, unspecified: Secondary | ICD-10-CM | POA: Diagnosis not present

## 2014-07-08 DIAGNOSIS — E119 Type 2 diabetes mellitus without complications: Secondary | ICD-10-CM

## 2014-07-08 DIAGNOSIS — R5383 Other fatigue: Secondary | ICD-10-CM

## 2014-07-08 DIAGNOSIS — I10 Essential (primary) hypertension: Secondary | ICD-10-CM | POA: Diagnosis not present

## 2014-07-08 DIAGNOSIS — D509 Iron deficiency anemia, unspecified: Secondary | ICD-10-CM

## 2014-07-08 DIAGNOSIS — M25571 Pain in right ankle and joints of right foot: Secondary | ICD-10-CM

## 2014-07-08 NOTE — Progress Notes (Signed)
Patient ID: Maureen Morales, female   DOB: 02-19-1923, 81 y.o.   MRN: [id number]  Patient Active Problem List   Diagnosis Date Noted  . Pain in joint, ankle and foot 06/24/2014  . Atypical chest pain 02/17/2014  . S/P laparoscopic cholecystectomy 12/15/2013  . Preoperative general physical examination 12/15/2013  . Encounter for preventive health examination 11/13/2013  . Unspecified constipation 11/13/2013  . Cervical spine disease 01/27/2013  . Anemia, iron deficiency 01/21/2013  . Right medial knee pain 01/09/2013  . Back pain, thoracic 08/27/2012  . Feeling of chest tightness 08/07/2012  . Aortic stenosis, mild 05/06/2012  . Asthma 07/04/2011  . Hemorrhoids, internal 04/05/2011  . Nummular eczema 01/16/2011  . Hypertension   . Type II diabetes mellitus, well controlled   . Hyperlipidemia     Subjective:  CC:   Chief Complaint  Patient presents with  . Follow-up    HPI:   Maureen Morales is a 81 y.o. female who presents for  Follow up on DM, hyperlipidemia, right foot pain resulting from recent fall on down her carport steps.  She was seen by Podiatry for  Plain films concerning for possible fracture of 2nd metatarsal but Dr Cleda Mccreedy repeated films and ruled out fracture.[name]  He placed her in a boot, and advise her to elevate leg whenever possible.  She has not been staying off the leg and is still having pain and swelling of the  Forefoot. 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 She is wearing the boot Dr Cleda Mccreedy provided her and sees him again this Friday.    She has been limiting her diet after her last A1c showed slight loss of control,  And has lost 2 lbs.  Not checking sugars.  Sleeping well.  Very active physically all day long but not participating in regular exercise.    Past Medical History  Diagnosis Date  . Hypertension   .  Diabetes mellitus   . Hyperlipidemia   . Viral gastroenteritis due to Norwalk-like agent Nov 2012    Past Surgical History  Procedure Laterality Date  . Bladder surgery    . Abdominal hysterectomy    . Cardiac catheterization    . Cholecystectomy         The following portions of the patient's history were reviewed and updated as appropriate: Allergies, current medications, and problem list.    Review of Systems:   Patient denies headache, fevers, malaise, unintentional weight loss, skin rash, eye pain, sinus congestion and sinus pain, sore throat, dysphagia,  hemoptysis , cough, dyspnea, wheezing, chest pain, palpitations, orthopnea, edema, abdominal pain, nausea, melena, diarrhea, constipation, flank pain, dysuria, hematuria, urinary  Frequency, nocturia, numbness, tingling, seizures,  Focal weakness, Loss of consciousness,  Tremor, insomnia, depression, anxiety, and suicidal ideation.     History   Social History  . Marital Status: Married    Spouse Name: N/A  . Number of Children: N/A  . Years of Education: N/A   Occupational History  . Not on file.   Social History Main Topics  . Smoking status: Never Smoker   . Smokeless tobacco: Never Used  . Alcohol Use: No  . Drug Use: No  . Sexual Activity: Not on file   Other Topics Concern  . Not on file   Social History Narrative  Objective:  Filed Vitals:   07/08/14 1113  BP: 128/78  Pulse: 75  Temp: 98.4 F (36.9 C)  Resp: 16     General appearance: alert, cooperative and appears stated age Ears: normal TM's and external ear canals both ears Throat: lips, mucosa, and tongue normal; teeth and gums normal Neck: no adenopathy, no carotid bruit, supple, symmetrical, trachea midline and thyroid not enlarged, symmetric, no tenderness/mass/nodules Back: symmetric, no curvature. ROM normal. No CVA tenderness. Lungs: clear to auscultation bilaterally Heart: regular rate and rhythm, S1, S2 normal, no murmur,  click, rub or gallop Abdomen: soft, non-tender; bowel sounds normal; no masses,  no organomegaly Pulses: 2+ and symmetric Skin: Skin color, texture, turgor normal. No rashes or lesions Lymph nodes: Cervical, supraclavicular, and axillary nodes normal.  Assessment and Plan:  Pain in joint, ankle and foot There is no evidence of fracture by repeat films done by [facility], podiatry,  Advised  her to stay ff off her feet, use the boot, and take an antiinflammatory     Anemia, iron deficiency Mild but persistent.  No further workup per GI evaluation. Advised to take ferrous sulfate once daily for 3 months.    Type II diabetes mellitus, well controlled Historically well-controlled on current medications.  hemoglobin A1c had risen slightly last time and she has been watching her diet more closesly , She will return next week for labs. . Patient is up-to-date on eye exams and foot exam is normal today. Patient is due for urine microalbumin to creatinine ratio and a1c. Patient is not on statin therapy for CAD risk reduction given her age and is tolerating  ACE/ARB .   Lab Results  Component Value Date   HGBA1C 7.1* 04/09/2014   Lab Results  Component Value Date   MICROALBUR 4.4* 04/09/2014      Hypertension Well controlled on current regimen. Renal function has been stable, no changes today.  Lab Results  Component Value Date   CREATININE 1.1 04/09/2014   Lab Results  Component Value Date   NA 137 04/09/2014   K 4.2 04/09/2014   CL 102 04/09/2014   CO2 27 04/09/2014       Updated Medication List Outpatient Encounter Prescriptions as of 07/08/2014  Medication Sig  . fenofibrate 160 MG tablet TAKE 1 TABLET EVERY DAY FOR HIGH TRIGLYCERIDES  . ibuprofen (ADVIL,MOTRIN) 200 MG tablet Take 200 mg by mouth as needed.    . loratadine (CLARITIN) 10 MG tablet Take 10 mg by mouth daily.  . metFORMIN (GLUCOPHAGE-XR) 750 MG 24 hr tablet TAKE 1 TABLET EVERY DAY WITH BREAKFAST  .  metoprolol succinate (TOPROL-XL) 25 MG 24 hr tablet TAKE 1 TABLET EVERY DAY  . Multiple Vitamins-Minerals (ABC PLUS SENIOR PO) Take by mouth.    . Omega-3 Fatty Acids (FISH OIL) 1200 MG CAPS Take by mouth.    . Probiotic Product (PHILLIPS COLON HEALTH PO) Take 1 tablet by mouth daily.    . valsartan-hydrochlorothiazide (DIOVAN-HCT) 320-25 MG per tablet TAKE 1 TABLET EVERY DAY USUALLY IN THE MORNING  . BIOTIN 5000 PO Take by mouth.    . Calcium Carb-Cholecalciferol (CALCIUM 600 + D PO) Take by mouth daily.  . calcium carbonate (TUMS) 500 MG chewable tablet Chew 1 tablet by mouth. Taken when needed  . cetirizine (ZYRTEC) 10 MG tablet Take 10 mg by mouth as needed.   . cholecalciferol (VITAMIN D) 1000 UNITS tablet Take 1,000 Units by mouth daily.    [name] estrogens, conjugated, (PREMARIN) 0.3  MG tablet Take 0.3 mg by mouth daily. Take daily for 21 days then do not take for 7 days.  [name] glucose blood (BAYER CONTOUR TEST) test strip Use as instructed (Patient not taking: Reported on 07/08/2014)  . HYDROcodone-acetaminophen (NORCO/VICODIN) 5-325 MG per tablet Take 1 tablet by mouth every 6 (six) hours as needed for moderate pain. (Patient not taking: Reported on 07/08/2014)  . Lactulose 20 GM/30ML SOLN Take 30 mLs (20 g total) by mouth as needed. To releive constipation (Patient not taking: Reported on 03/25/2014)  . [DISCONTINUED] TDaP (BOOSTRIX) 5-2.5-18.5 LF-MCG/0.5 injection Inject 0.5 mLs into the muscle once. (Patient not taking: Reported on 03/25/2014)     Orders Placed This Encounter  Procedures  . Comprehensive metabolic panel  . Hemoglobin A1c  . Lipid panel  . TSH  . Vit D  25 hydroxy (rtn osteoporosis monitoring)    No Follow-up on file.

## 2014-07-08 NOTE — Patient Instructions (Signed)
You can take one calcium supplement daily along with your multivitamin.   You should take one iron supplement daily with a meal to help resolve your mild anemia.   I am checking your thyroid  Next week with your other fasting labs   Your foot will not heal if you do not stay off of it!!  Let Judson Roch go down the stairs for you and rest your foot every hour by elevating it

## 2014-07-08 NOTE — Progress Notes (Signed)
Pre-visit discussion using our clinic review tool. No additional management support is needed unless otherwise documented below in the visit note.  

## 2014-07-09 DIAGNOSIS — M79671 Pain in right foot: Secondary | ICD-10-CM | POA: Diagnosis not present

## 2014-07-11 ENCOUNTER — Encounter: Payer: Self-pay | Admitting: Internal Medicine

## 2014-07-11 NOTE — Assessment & Plan Note (Signed)
Historically well-controlled on current medications.  hemoglobin A1c had risen slightly last time and she has been watching her diet more closesly , She will return next week for labs. . Patient is up-to-date on eye exams and foot exam is normal today. Patient is due for urine microalbumin to creatinine ratio and a1c. Patient is not on statin therapy for CAD risk reduction given her age and is tolerating  ACE/ARB .   Lab Results  Component Value Date   HGBA1C 7.1* 04/09/2014   Lab Results  Component Value Date   MICROALBUR 4.4* 04/09/2014

## 2014-07-11 NOTE — Assessment & Plan Note (Signed)
Well controlled on current regimen. Renal function has been stable, no changes today.  Lab Results  Component Value Date   CREATININE 1.1 04/09/2014   Lab Results  Component Value Date   NA 137 04/09/2014   K 4.2 04/09/2014   CL 102 04/09/2014   CO2 27 04/09/2014

## 2014-07-11 NOTE — Assessment & Plan Note (Signed)
Mild but persistent.  No further workup per GI evaluation. Advised to take ferrous sulfate once daily for 3 months.

## 2014-07-11 NOTE — Assessment & Plan Note (Signed)
There is no evidence of fracture by repeat films done by [facility], podiatry,  Advised  her to stay ff off her feet, use the boot, and take an antiinflammatory

## 2014-08-13 ENCOUNTER — Telehealth: Payer: Self-pay | Admitting: Internal Medicine

## 2014-08-13 ENCOUNTER — Other Ambulatory Visit (INDEPENDENT_AMBULATORY_CARE_PROVIDER_SITE_OTHER): Payer: Medicare Other

## 2014-08-13 DIAGNOSIS — R5383 Other fatigue: Secondary | ICD-10-CM

## 2014-08-13 DIAGNOSIS — E119 Type 2 diabetes mellitus without complications: Secondary | ICD-10-CM

## 2014-08-13 DIAGNOSIS — E559 Vitamin D deficiency, unspecified: Secondary | ICD-10-CM | POA: Diagnosis not present

## 2014-08-13 LAB — HEMOGLOBIN A1C: HEMOGLOBIN A1C: 6.7 % — AB (ref 4.6–6.5)

## 2014-08-13 LAB — VITAMIN D 25 HYDROXY (VIT D DEFICIENCY, FRACTURES): VITD: 42.48 ng/mL (ref 30.00–100.00)

## 2014-08-13 LAB — LIPID PANEL
CHOL/HDL RATIO: 3
Cholesterol: 164 mg/dL (ref 0–200)
HDL: 52.8 mg/dL (ref >39.00)
LDL Cholesterol: 92 mg/dL (ref 0–99)
NONHDL: 111.2
Triglycerides: 97 mg/dL (ref 0.0–149.0)
VLDL: 19.4 mg/dL (ref 0.0–40.0)

## 2014-08-13 LAB — COMPREHENSIVE METABOLIC PANEL
ALT: 27 U/L (ref 0–35)
AST: 23 U/L (ref 0–37)
Albumin: 4.3 g/dL (ref 3.5–5.2)
Alkaline Phosphatase: 56 U/L (ref 39–117)
BILIRUBIN TOTAL: 0.4 mg/dL (ref 0.2–1.2)
BUN: 32 mg/dL — ABNORMAL HIGH (ref 6–23)
CHLORIDE: 105 meq/L (ref 96–112)
CO2: 29 mEq/L (ref 19–32)
Calcium: 10.1 mg/dL (ref 8.4–10.5)
Creatinine, Ser: 1.11 mg/dL (ref 0.40–1.20)
GFR: 49.14 mL/min — ABNORMAL LOW (ref >60.00)
Glucose, Bld: 166 mg/dL — ABNORMAL HIGH (ref 70–99)
Potassium: 4.5 mEq/L (ref 3.5–5.1)
SODIUM: 140 meq/L (ref 135–145)
TOTAL PROTEIN: 7.1 g/dL (ref 6.0–8.3)

## 2014-08-13 LAB — TSH: TSH: 4.09 u[IU]/mL (ref 0.35–4.50)

## 2014-08-13 NOTE — Telephone Encounter (Signed)
The patient has been scheduled for her mammogram at [facility] she is aware of her appointment on 5.17.16 @ 8:40.

## 2014-08-14 ENCOUNTER — Other Ambulatory Visit: Payer: Self-pay | Admitting: Internal Medicine

## 2014-08-14 DIAGNOSIS — Z1231 Encounter for screening mammogram for malignant neoplasm of breast: Secondary | ICD-10-CM

## 2014-08-17 DIAGNOSIS — M7751 Other enthesopathy of right foot: Secondary | ICD-10-CM | POA: Diagnosis not present

## 2014-08-17 DIAGNOSIS — M722 Plantar fascial fibromatosis: Secondary | ICD-10-CM | POA: Diagnosis not present

## 2014-08-17 DIAGNOSIS — M79671 Pain in right foot: Secondary | ICD-10-CM | POA: Diagnosis not present

## 2014-08-18 DIAGNOSIS — Z23 Encounter for immunization: Secondary | ICD-10-CM | POA: Diagnosis not present

## 2014-08-22 NOTE — Op Note (Signed)
PATIENT NAME:  Maureen, Morales MR#:  [id number] DATE OF BIRTH:  1922-09-07  DATE OF PROCEDURE:  12/25/2013  PREOPERATIVE DIAGNOSIS: Chronic cholecystitis and cholelithiasis.   POSTOPERATIVE DIAGNOSIS: Chronic cholecystitis and cholelithiasis.   SURGERY: Laparoscopic cholecystectomy with cholangiography.   SURGEON: Rodena Goldmann, MD.   ANESTHESIA: General.   OPERATIVE PROCEDURE: With the patient in the supine position after induction of appropriate general anesthesia, the patient's abdomen was prepped with ChloraPrep and draped with sterile towels. The patient was placed in the head-down, feet-up position. A small infraumbilical incision was made in standard fashion and carried down bluntly through the subcutaneous tissue. A Veress needle was used to cannulate the peritoneal cavity. CO2 was insufflate to appropriate pressure measurements. When approximately 10 mL of CO2 were instilled, the Veress needle was withdrawn. An 11 mm [employer] port was inserted into the peritoneal cavity. Intraperitoneal position was confirmed. CO2 was reinsufflated. The patient was placed in the head-up, feet-down position and rotated slightly to the left side. A subxiphoid transverse incision was made and an 11 mm port was inserted under direct vision. Two lateral ports, 5 mm in size, were inserted under direct vision. The gallbladder was grasped, retracted superiorly, and laterally; appeared to be chronically inflamed. There were some adhesions. Dissection was carried along the hepatoduodenal ligament. The cystic artery and cystic duct were identified. The cystic duct was clipped on the gallbladder side and opened. An on-table cholangiogram using dynamic fluoroscopy revealed free flow of dye into the duodenum. It was around the defect in the bile duct, which I think was represent the balloon. Dye did fill the proximal bile ducts. The catheter was withdrawn. The cystic duct was doubly clipped and divided. The cystic artery  was visualized, doubly clipped and divided. The gallbladder was then dissected free from its bed and delivered using hook cautery apparatus. Once the gallbladder was free, the camera was focused on the upper midline port, and the gallbladder was retrieved through that upper midline port. The area was copiously irrigated. The midline fascia was closed with figure-of-eight suture of 0 Vicryl using the suture passer and skin was closed with 5-0 nylon. Sterile dressings applied after infiltrating with 0.25% Marcaine for postoperative pain control. The patient taken to recovery room, having tolerated the procedure well. Sponge, instrument, and needle counts were correct x 2 in the operating room.    ____________________________ Rodena Goldmann III, MD rle:NT D: 12/25/2013 11:02:01 ET T: 12/25/2013 12:15:02 ET JOB#: [id number]  cc: Micheline Maze, MD, <Dictator> Deborra Medina, MD Rodena Goldmann MD ELECTRONICALLY SIGNED 12/25/2013 17:30

## 2014-09-04 ENCOUNTER — Other Ambulatory Visit: Payer: Self-pay | Admitting: Internal Medicine

## 2014-09-15 ENCOUNTER — Other Ambulatory Visit: Payer: Self-pay | Admitting: Internal Medicine

## 2014-09-15 ENCOUNTER — Ambulatory Visit
Admission: RE | Admit: 2014-09-15 | Discharge: 2014-09-15 | Disposition: A | Discharge status: Home / Self Care | Payer: Medicare Other | Source: Ambulatory Visit | Attending: Internal Medicine | Admitting: Internal Medicine

## 2014-09-15 DIAGNOSIS — Z1231 Encounter for screening mammogram for malignant neoplasm of breast: Secondary | ICD-10-CM

## 2014-09-17 ENCOUNTER — Encounter: Payer: Self-pay | Admitting: *Deleted

## 2014-09-30 ENCOUNTER — Ambulatory Visit (INDEPENDENT_AMBULATORY_CARE_PROVIDER_SITE_OTHER): Payer: Medicare Other | Admitting: Internal Medicine

## 2014-09-30 ENCOUNTER — Encounter: Payer: Self-pay | Admitting: Internal Medicine

## 2014-09-30 VITALS — BP 140/64 | HR 75 | Temp 98.4°F | Resp 14 | Ht 62.0 in | Wt 128.2 lb

## 2014-09-30 DIAGNOSIS — M25579 Pain in unspecified ankle and joints of unspecified foot: Secondary | ICD-10-CM | POA: Diagnosis not present

## 2014-09-30 NOTE — Progress Notes (Signed)
Pre-visit discussion using our clinic review tool. No additional management support is needed unless otherwise documented below in the visit note.  

## 2014-10-03 NOTE — Progress Notes (Signed)
Subjective:  Patient ID: Maureen Morales, female    DOB: 13-Nov-1922  Age: 81 y.o. MRN: [id number]  CC: The encounter diagnosis was Pain in joint, ankle and foot, unspecified laterality.  HPI [name] presents for evaluation of foot pain.  She has a history of DM and is concerned about neuropathy.  She describes to pain as an aching of tre bones that is present upon waking and is relieved by getting off her feet.  She deneis numbness, tingling and burning.  Her BS have been well controlled for years.    Outpatient Prescriptions Prior to Visit  Medication Sig Dispense Refill  . BIOTIN 5000 PO Take by mouth.      . Calcium Carb-Cholecalciferol (CALCIUM 600 + D PO) Take by mouth daily.    . calcium carbonate (TUMS) 500 MG chewable tablet Chew 1 tablet by mouth. Taken when needed    . cetirizine (ZYRTEC) 10 MG tablet Take 10 mg by mouth as needed.     . cholecalciferol (VITAMIN D) 1000 UNITS tablet Take 1,000 Units by mouth daily.      [name] estrogens, conjugated, (PREMARIN) 0.3 MG tablet Take 0.3 mg by mouth daily. Take daily for 21 days then do not take for 7 days.    . fenofibrate 160 MG tablet TAKE 1 TABLET EVERY DAY FOR HIGH TRIGLYCERIDES 30 tablet 6  . glucose blood (BAYER CONTOUR TEST) test strip Use as instructed 100 each 5  . HYDROcodone-acetaminophen (NORCO/VICODIN) 5-325 MG per tablet Take 1 tablet by mouth every 6 (six) hours as needed for moderate pain. 60 tablet 0  . ibuprofen (ADVIL,MOTRIN) 200 MG tablet Take 200 mg by mouth as needed.      . Lactulose 20 GM/30ML SOLN Take 30 mLs (20 g total) by mouth as needed. To releive constipation 240 mL 3  . loratadine (CLARITIN) 10 MG tablet Take 10 mg by mouth daily.    . metFORMIN (GLUCOPHAGE-XR) 750 MG 24 hr tablet TAKE 1 TABLET EVERY DAY WITH BREAKFAST 30 tablet 5  . metoprolol succinate (TOPROL-XL) 25 MG 24 hr tablet TAKE 1 TABLET EVERY DAY 30 tablet 5  . Multiple Vitamins-Minerals (ABC PLUS SENIOR PO) Take by mouth.      . Omega-3  Fatty Acids (FISH OIL) 1200 MG CAPS Take by mouth.      . Probiotic Product (PHILLIPS COLON HEALTH PO) Take 1 tablet by mouth daily.      . valsartan-hydrochlorothiazide (DIOVAN-HCT) 320-25 MG per tablet TAKE 1 TABLET EVERY DAY USUALLY IN THE MORNING 30 tablet 11   No facility-administered medications prior to visit.    Review of Systems;  Patient denies headache, fevers, malaise, unintentional weight loss, skin rash, eye pain, sinus congestion and sinus pain, sore throat, dysphagia,  hemoptysis , cough, dyspnea, wheezing, chest pain, palpitations, orthopnea, edema, abdominal pain, nausea, melena, diarrhea, constipation, flank pain, dysuria, hematuria, urinary  Frequency, nocturia, numbness, tingling, seizures,  Focal weakness, Loss of consciousness,  Tremor, insomnia, depression, anxiety, and suicidal ideation.      Objective:  BP 140/64 mmHg  Pulse 75  Temp(Src) 98.4 F (36.9 C) (Oral)  Resp 14  Ht 5\' 2"  (1.575 m)  Wt 128 lb 4 oz (58.174 kg)  BMI 23.45 kg/m2  SpO2 98%  BP Readings from Last 3 Encounters:  09/30/14 140/64  07/08/14 128/78  03/25/14 140/60    Wt Readings from Last 3 Encounters:  09/30/14 128 lb 4 oz (58.174 kg)  07/08/14 125 lb 8 oz (56.926  kg)  03/25/14 125 lb 8 oz (56.926 kg)    General appearance: alert, cooperative and appears stated age Back: symmetric, no curvature. ROM normal. No CVA tenderness. Lungs: clear to auscultation bilaterally Heart: regular rate and rhythm, S1, S2 normal, no murmur, click, rub or gallop Abdomen: soft, non-tender; bowel sounds normal; no masses,  no organomegaly Pulses: 2+ and symmetric Skin: Skin color, texture, turgor normal. No rashes or lesions Lymph nodes: Cervical, supraclavicular, and axillary nodes normal. Foot exam:  Nails are well trimmed,  No callouses,  Sensation intact to microfilament  Lab Results  Component Value Date   HGBA1C 6.7* 08/13/2014   HGBA1C 7.1* 04/09/2014   HGBA1C 6.9* 11/13/2013    Lab  Results  Component Value Date   CREATININE 1.11 08/13/2014   CREATININE 1.1 04/09/2014   CREATININE 1.1 12/15/2013    Lab Results  Component Value Date   WBC 8.7 04/09/2014   HGB 10.8* 04/09/2014   HCT 32.1* 04/09/2014   PLT 249.0 04/09/2014   GLUCOSE 166* 08/13/2014   CHOL 164 08/13/2014   TRIG 97.0 08/13/2014   HDL 52.80 08/13/2014   LDLDIRECT 75.5 11/13/2013   LDLCALC 92 08/13/2014   ALT 27 08/13/2014   AST 23 08/13/2014   NA 140 08/13/2014   K 4.5 08/13/2014   CL 105 08/13/2014   CREATININE 1.11 08/13/2014   BUN 32* 08/13/2014   CO2 29 08/13/2014   TSH 4.09 08/13/2014   INR 1.2* 12/15/2013   HGBA1C 6.7* 08/13/2014   MICROALBUR 4.4* 04/09/2014    Mm Screening Breast Tomo Bilateral  09/15/2014   CLINICAL DATA:  Screening.  EXAM: DIGITAL SCREENING BILATERAL MAMMOGRAM WITH 3D TOMO WITH CAD  COMPARISON:  Previous exam(s).  ACR Breast Density Category b: There are scattered areas of fibroglandular density.  FINDINGS: There are no findings suspicious for malignancy. Images were processed with CAD.  IMPRESSION: No mammographic evidence of malignancy. A result letter of this screening mammogram will be mailed directly to the patient.  RECOMMENDATION: Screening mammogram in one year. (Code:SM-B-01Y)  BI-RADS CATEGORY  1: Negative.   Electronically Signed   By: Lajean Manes M.D.   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          I am having Ms. Caulfield maintain her Fish Oil, Multiple Vitamins-Minerals (ABC PLUS SENIOR PO), calcium carbonate, cholecalciferol, BIOTIN 5000 PO, ibuprofen, Probiotic Product ([place] PO), glucose blood, cetirizine, valsartan-hydrochlorothiazide, Lactulose, estrogens (conjugated), Calcium  Carb-Cholecalciferol (CALCIUM 600 + D PO), fenofibrate, metFORMIN, HYDROcodone-acetaminophen, loratadine, and metoprolol succinate.  No orders of the defined types were placed in this encounter.    There are no discontinued medications.  Follow-up: No Follow-up on file.   [name], MD

## 2014-10-03 NOTE — Assessment & Plan Note (Signed)
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

## 2014-10-16 DIAGNOSIS — M7752 Other enthesopathy of left foot: Secondary | ICD-10-CM | POA: Diagnosis not present

## 2014-10-16 DIAGNOSIS — M79671 Pain in right foot: Secondary | ICD-10-CM | POA: Diagnosis not present

## 2014-10-16 DIAGNOSIS — M25571 Pain in right ankle and joints of right foot: Secondary | ICD-10-CM | POA: Diagnosis not present

## 2014-10-16 DIAGNOSIS — R600 Localized edema: Secondary | ICD-10-CM | POA: Diagnosis not present

## 2014-10-19 ENCOUNTER — Other Ambulatory Visit: Payer: Self-pay | Admitting: Internal Medicine

## 2014-11-30 ENCOUNTER — Other Ambulatory Visit: Payer: Self-pay | Admitting: Internal Medicine

## 2014-12-10 ENCOUNTER — Other Ambulatory Visit: Payer: Self-pay | Admitting: Internal Medicine

## 2014-12-28 ENCOUNTER — Encounter: Payer: Self-pay | Admitting: Internal Medicine

## 2014-12-28 ENCOUNTER — Ambulatory Visit (INDEPENDENT_AMBULATORY_CARE_PROVIDER_SITE_OTHER): Payer: Medicare Other | Admitting: Internal Medicine

## 2014-12-28 VITALS — BP 120/60 | HR 67 | Temp 98.3°F | Resp 12 | Ht 62.0 in | Wt 127.5 lb

## 2014-12-28 DIAGNOSIS — Z23 Encounter for immunization: Secondary | ICD-10-CM | POA: Diagnosis not present

## 2014-12-28 DIAGNOSIS — E119 Type 2 diabetes mellitus without complications: Secondary | ICD-10-CM | POA: Diagnosis not present

## 2014-12-28 DIAGNOSIS — I10 Essential (primary) hypertension: Secondary | ICD-10-CM

## 2014-12-28 NOTE — Patient Instructions (Addendum)
Take 2 of your new fish oil daily to get 1200 mg .     You need to have your fasting labs in mid October  ( we'll check kidneys and diabetes at that time )   YOU can use debrox liquid in your ears to clean ou tears and prevent buildup

## 2014-12-28 NOTE — Progress Notes (Signed)
Pre-visit discussion using our clinic review tool. No additional management support is needed unless otherwise documented below in the visit note.  

## 2014-12-29 NOTE — Assessment & Plan Note (Signed)
Historically well-controlled on current medications. . Patient is up-to-date on eye exams and foot exam is normal today. Patient is had minimally elevated  urine microalbumin to creatinine ratio a. Patient is not on statin therapy for CAD risk reduction given her age and is tolerating  ACE/ARB .   Lab Results  Component Value Date   HGBA1C 6.7* 08/13/2014   Lab Results  Component Value Date   MICROALBUR 4.4* 04/09/2014

## 2014-12-29 NOTE — Assessment & Plan Note (Signed)
Well controlled on current regimen. Renal function stable, no changes today.  Lab Results  Component Value Date   CREATININE 1.11 08/13/2014   Lab Results  Component Value Date   NA 140 08/13/2014   K 4.5 08/13/2014   CL 105 08/13/2014   CO2 29 08/13/2014

## 2014-12-29 NOTE — Progress Notes (Signed)
Subjective:  Patient ID: Maureen Morales, female    DOB: 1923/03/14  Age: 81 y.o. MRN: [id number]  CC: The primary encounter diagnosis was Encounter for immunization. Diagnoses of Type II diabetes mellitus, well controlled and Essential hypertension were also pertinent to this visit.  HPI Maureen Morales presents for follow up on DM type 2, hypertension and hyperlipidemia.  She feels generally well, is walking several times per week .  Does not check blood sugars due to difficulty with glucometer which is new. Denies any recent symptoms of hypoglycemia.   Taking her medications as directed. Following a carbohydrate modified diet 6 days per week. Denies numbness, burning and tingling of extremities. Appetite is good.     Outpatient Prescriptions Prior to Visit  Medication Sig Dispense Refill  . Calcium Carb-Cholecalciferol (CALCIUM 600 + D PO) Take by mouth daily.    . calcium carbonate (TUMS) 500 MG chewable tablet Chew 1 tablet by mouth. Taken when needed    . estrogens, conjugated, (PREMARIN) 0.3 MG tablet Take 0.3 mg by mouth daily. Take daily for 21 days then do not take for 7 days.    . fenofibrate 160 MG tablet TAKE 1 TABLET EVERY DAY FOR HIGH TRIGLYCERIDES 30 tablet 2  . glucose blood (BAYER CONTOUR TEST) test strip Use as instructed 100 each 5  . ibuprofen (ADVIL,MOTRIN) 200 MG tablet Take 200 mg by mouth as needed.      . loratadine (CLARITIN) 10 MG tablet Take 10 mg by mouth daily.    . metFORMIN (GLUCOPHAGE-XR) 750 MG 24 hr tablet TAKE 1 TABLET EVERY DAY WITH BREAKFAST 30 tablet 6  . metoprolol succinate (TOPROL-XL) 25 MG 24 hr tablet TAKE 1 TABLET EVERY DAY 30 tablet 5  . Multiple Vitamins-Minerals (ABC PLUS SENIOR PO) Take by mouth.      . Omega-3 Fatty Acids (FISH OIL) 1200 MG CAPS Take by mouth.      . Probiotic Product (PHILLIPS COLON HEALTH PO) Take 1 tablet by mouth daily.      . valsartan-hydrochlorothiazide (DIOVAN-HCT) 320-25 MG per tablet TAKE 1 TABLET EVERY DAY USUALLY  IN THE MORNING 30 tablet 5  . BIOTIN 5000 PO Take by mouth.      . cholecalciferol (VITAMIN D) 1000 UNITS tablet Take 1,000 Units by mouth daily.      [name] HYDROcodone-acetaminophen (NORCO/VICODIN) 5-325 MG per tablet Take 1 tablet by mouth every 6 (six) hours as needed for moderate pain. (Patient not taking: Reported on 12/28/2014) 60 tablet 0  . Lactulose 20 GM/30ML SOLN Take 30 mLs (20 g total) by mouth as needed. To releive constipation (Patient not taking: Reported on 12/28/2014) 240 mL 3  . cetirizine (ZYRTEC) 10 MG tablet Take 10 mg by mouth as needed.      No facility-administered medications prior to visit.    Review of Systems;  Patient denies headache, fevers, malaise, unintentional weight loss, skin rash, eye pain, sinus congestion and sinus pain, sore throat, dysphagia,  hemoptysis , cough, dyspnea, wheezing, chest pain, palpitations, orthopnea, edema, abdominal pain, nausea, melena, diarrhea, constipation, flank pain, dysuria, hematuria, urinary  Frequency, nocturia, numbness, tingling, seizures,  Focal weakness, Loss of consciousness,  Tremor, insomnia, depression, anxiety, and suicidal ideation.      Objective:  BP 120/60 mmHg  Pulse 67  Temp(Src) 98.3 F (36.8 C) (Oral)  Resp 12  Ht 5\' 2"  (1.575 m)  Wt 127 lb 8 oz (57.834 kg)  BMI 23.31 kg/m2  SpO2 97%  BP Readings  from Last 3 Encounters:  12/28/14 120/60  09/30/14 140/64  07/08/14 128/78    Wt Readings from Last 3 Encounters:  12/28/14 127 lb 8 oz (57.834 kg)  09/30/14 128 lb 4 oz (58.174 kg)  07/08/14 125 lb 8 oz (56.926 kg)    General appearance: alert, cooperative and appears stated age Ears: normal TM's and external ear canals both ears Throat: lips, mucosa, and tongue normal; teeth and gums normal Neck: no adenopathy, no carotid bruit, supple, symmetrical, trachea midline and thyroid not enlarged, symmetric, no tenderness/mass/nodules Back: symmetric, no curvature. ROM normal. No CVA tenderness. Lungs:  clear to auscultation bilaterally Heart: regular rate and rhythm, S1, S2 normal, no murmur, click, rub or gallop Abdomen: soft, non-tender; bowel sounds normal; no masses,  no organomegaly Pulses: 2+ and symmetric Skin: Skin color, texture, turgor normal. No rashes or lesions Lymph nodes: Cervical, supraclavicular, and axillary nodes normal.  Lab Results  Component Value Date   HGBA1C 6.7* 08/13/2014   HGBA1C 7.1* 04/09/2014   HGBA1C 6.9* 11/13/2013    Lab Results  Component Value Date   CREATININE 1.11 08/13/2014   CREATININE 1.1 04/09/2014   CREATININE 1.1 12/15/2013    Lab Results  Component Value Date   WBC 8.7 04/09/2014   HGB 10.8* 04/09/2014   HCT 32.1* 04/09/2014   PLT 249.0 04/09/2014   GLUCOSE 166* 08/13/2014   CHOL 164 08/13/2014   TRIG 97.0 08/13/2014   HDL 52.80 08/13/2014   LDLDIRECT 75.5 11/13/2013   LDLCALC 92 08/13/2014   ALT 27 08/13/2014   AST 23 08/13/2014   NA 140 08/13/2014   K 4.5 08/13/2014   CL 105 08/13/2014   CREATININE 1.11 08/13/2014   BUN 32* 08/13/2014   CO2 29 08/13/2014   TSH 4.09 08/13/2014   INR 1.2* 12/15/2013   HGBA1C 6.7* 08/13/2014   MICROALBUR 4.4* 04/09/2014    Mm Screening Breast Tomo Bilateral  09/15/2014   CLINICAL DATA:  Screening.  EXAM: DIGITAL SCREENING BILATERAL MAMMOGRAM WITH 3D TOMO WITH CAD  COMPARISON:  Previous exam(s).  ACR Breast Density Category b: There are scattered areas of fibroglandular density.  FINDINGS: There are no findings suspicious for malignancy. Images were processed with CAD.  IMPRESSION: No mammographic evidence of malignancy. A result letter of this screening mammogram will be mailed directly to the patient.  RECOMMENDATION: Screening mammogram in one year. (Code:[id number]-B-01Y)  BI-RADS CATEGORY  1: Negative.   Electronically Signed   By: Lajean Manes M.D.   On: 09/15/2014 09:34    Assessment & Plan:   Problem List Items Addressed This Visit      Unprioritized   Hypertension    Well  controlled on current regimen. Renal function stable, no changes today.  Lab Results  Component Value Date   CREATININE 1.11 08/13/2014   Lab Results  Component Value Date   NA 140 08/13/2014   K 4.5 08/13/2014   CL 105 08/13/2014   CO2 29 08/13/2014         Type II diabetes mellitus, well controlled    Historically well-controlled on current medications. . Patient is up-to-date on eye exams and foot exam is normal today. Patient is had minimally elevated  urine microalbumin to creatinine ratio a. Patient is not on statin therapy for CAD risk reduction given her age and is tolerating  ACE/ARB .   Lab Results  Component Value Date   HGBA1C 6.7* 08/13/2014   Lab Results  Component Value Date   MICROALBUR 4.4* 04/09/2014  Other Visit Diagnoses    Encounter for immunization    -  Primary       I have discontinued Ms. Tony cetirizine. I am also having her maintain her Fish Oil, Multiple Vitamins-Minerals (ABC PLUS SENIOR PO), calcium carbonate, cholecalciferol, BIOTIN 5000 PO, ibuprofen, Probiotic Product (PHILLIPS COLON HEALTH PO), glucose blood, Lactulose, estrogens (conjugated), Calcium Carb-Cholecalciferol (CALCIUM 600 + D PO), HYDROcodone-acetaminophen, loratadine, metoprolol succinate, valsartan-hydrochlorothiazide, metFORMIN, and fenofibrate.  Meds ordered this encounter  Medications  . DISCONTD: loratadine (CLARITIN) 10 MG tablet    Sig: Take 1 tablet by mouth daily.    Medications Discontinued During This Encounter  Medication Reason  . cetirizine (ZYRTEC) 10 MG tablet Change in therapy  . loratadine (CLARITIN) 10 MG tablet Duplicate    Follow-up: No Follow-up on file.   [name], MD

## 2015-02-17 ENCOUNTER — Ambulatory Visit (INDEPENDENT_AMBULATORY_CARE_PROVIDER_SITE_OTHER): Payer: Medicare Other | Admitting: Internal Medicine

## 2015-02-17 ENCOUNTER — Other Ambulatory Visit (INDEPENDENT_AMBULATORY_CARE_PROVIDER_SITE_OTHER): Payer: Medicare Other

## 2015-02-17 ENCOUNTER — Encounter: Payer: Self-pay | Admitting: Internal Medicine

## 2015-02-17 VITALS — BP 124/60 | HR 74 | Temp 98.8°F | Resp 12 | Ht 62.0 in | Wt 125.0 lb

## 2015-02-17 DIAGNOSIS — D2271 Melanocytic nevi of right lower limb, including hip: Secondary | ICD-10-CM

## 2015-02-17 DIAGNOSIS — N3001 Acute cystitis with hematuria: Secondary | ICD-10-CM

## 2015-02-17 DIAGNOSIS — E119 Type 2 diabetes mellitus without complications: Secondary | ICD-10-CM

## 2015-02-17 DIAGNOSIS — R1030 Lower abdominal pain, unspecified: Secondary | ICD-10-CM

## 2015-02-17 DIAGNOSIS — E1121 Type 2 diabetes mellitus with diabetic nephropathy: Secondary | ICD-10-CM | POA: Diagnosis not present

## 2015-02-17 DIAGNOSIS — D2371 Other benign neoplasm of skin of right lower limb, including hip: Secondary | ICD-10-CM

## 2015-02-17 LAB — URINALYSIS, ROUTINE W REFLEX MICROSCOPIC
Bilirubin Urine: NEGATIVE
HGB URINE DIPSTICK: NEGATIVE
Ketones, ur: NEGATIVE
NITRITE: NEGATIVE
Specific Gravity, Urine: 1.02 (ref 1.000–1.030)
Urine Glucose: NEGATIVE
Urobilinogen, UA: 0.2 (ref 0.0–1.0)
pH: 5.5 (ref 5.0–8.0)

## 2015-02-17 LAB — COMPREHENSIVE METABOLIC PANEL
ALK PHOS: 46 U/L (ref 39–117)
ALT: 26 U/L (ref 0–35)
AST: 24 U/L (ref 0–37)
Albumin: 4.1 g/dL (ref 3.5–5.2)
BUN: 31 mg/dL — ABNORMAL HIGH (ref 6–23)
CO2: 28 meq/L (ref 19–32)
Calcium: 9.8 mg/dL (ref 8.4–10.5)
Chloride: 103 mEq/L (ref 96–112)
Creatinine, Ser: 1.13 mg/dL (ref 0.40–1.20)
GFR: 48.09 mL/min — AB (ref >60.00)
Glucose, Bld: 171 mg/dL — ABNORMAL HIGH (ref 70–99)
Potassium: 4.3 mEq/L (ref 3.5–5.1)
Sodium: 139 mEq/L (ref 135–145)
TOTAL PROTEIN: 6.9 g/dL (ref 6.0–8.3)
Total Bilirubin: 0.4 mg/dL (ref 0.2–1.2)

## 2015-02-17 LAB — LIPID PANEL
CHOLESTEROL: 136 mg/dL (ref 0–200)
HDL: 45.8 mg/dL (ref >39.00)
LDL Cholesterol: 68 mg/dL (ref 0–99)
NonHDL: 90.49
TRIGLYCERIDES: 113 mg/dL (ref 0.0–149.0)
Total CHOL/HDL Ratio: 3
VLDL: 22.6 mg/dL (ref 0.0–40.0)

## 2015-02-17 LAB — MICROALBUMIN / CREATININE URINE RATIO
Creatinine,U: 122 mg/dL
MICROALB/CREAT RATIO: 7.9 mg/g (ref 0.0–30.0)
Microalb, Ur: 9.7 mg/dL — ABNORMAL HIGH (ref 0.0–1.9)

## 2015-02-17 LAB — HEMOGLOBIN A1C: Hgb A1c MFr Bld: 7.3 % — ABNORMAL HIGH (ref 4.6–6.5)

## 2015-02-17 MED ORDER — CIPROFLOXACIN HCL 250 MG PO TABS: 250.0000 mg | ORAL_TABLET | Freq: Two times a day (BID) | ORAL | Status: DC

## 2015-02-17 NOTE — Progress Notes (Signed)
Subjective:  Patient ID: Maureen Morales, female    DOB: 04-03-1923  Age: 80 y.o. MRN: [id number]  CC: The primary encounter diagnosis was Suprapubic abdominal pain, unspecified laterality. Diagnoses of Dysplastic nevus of lower extremity, right, Acute cystitis with hematuria, and Type II diabetes mellitus with nephropathy ([name]) were also pertinent to this visit.  HPI Maureen Morales presents for evaluation of  suprapubic pain ,  The pain has been present for 4 or 5 days.  It was initiated by a bumpy tractor ride [place].  It is aggravated by standing  and climbing stairs .  Has had dysuria on and off  For same period of time.  Moving bowels twice daily ,  Stools are solid,  No blood, no straining.  Appetite good,  No nausea or fevers. No CVA pain.  History of hysterectomy and bladder suspension for bladder prolapse  NEW MOLE ON RIGHT FOOT LATERAL SIDE .    DM Follow up:  She denies any recent hypoglyemic events.  Taking his medications as directed. Following a carbohydrate modified , low fat Mediterranean diet . Denies numbness, burning and tingling of extremities. Appetite is good.    Outpatient Prescriptions Prior to Visit  Medication Sig Dispense Refill  . Calcium Carb-Cholecalciferol (CALCIUM 600 + D PO) Take by mouth daily.    . calcium carbonate (TUMS) 500 MG chewable tablet Chew 1 tablet by mouth. Taken when needed    . cholecalciferol (VITAMIN D) 1000 UNITS tablet Take 1,000 Units by mouth daily.      [name] estrogens, conjugated, (PREMARIN) 0.3 MG tablet Take 0.3 mg by mouth daily. Take daily for 21 days then do not take for 7 days.    . fenofibrate 160 MG tablet TAKE 1 TABLET EVERY DAY FOR HIGH TRIGLYCERIDES 30 tablet 2  . glucose blood (BAYER CONTOUR TEST) test strip Use as instructed 100 each 5  . HYDROcodone-acetaminophen (NORCO/VICODIN) 5-325 MG per tablet Take 1 tablet by mouth every 6 (six) hours as needed for moderate pain. 60 tablet 0  . ibuprofen (ADVIL,MOTRIN)  200 MG tablet Take 200 mg by mouth as needed.      . Lactulose 20 GM/30ML SOLN Take 30 mLs (20 g total) by mouth as needed. To releive constipation 240 mL 3  . loratadine (CLARITIN) 10 MG tablet Take 10 mg by mouth daily.    . metFORMIN (GLUCOPHAGE-XR) 750 MG 24 hr tablet TAKE 1 TABLET EVERY DAY WITH BREAKFAST 30 tablet 6  . metoprolol succinate (TOPROL-XL) 25 MG 24 hr tablet TAKE 1 TABLET EVERY DAY 30 tablet 5  . Multiple Vitamins-Minerals (ABC PLUS SENIOR PO) Take by mouth.      . Omega-3 Fatty Acids (FISH OIL) 1200 MG CAPS Take by mouth.      . Probiotic Product (PHILLIPS COLON HEALTH PO) Take 1 tablet by mouth daily.      . valsartan-hydrochlorothiazide (DIOVAN-HCT) 320-25 MG per tablet TAKE 1 TABLET EVERY DAY USUALLY IN THE MORNING 30 tablet 5  . BIOTIN 5000 PO Take by mouth.       No facility-administered medications prior to visit.    Review of Systems;  Patient denies headache, fevers, malaise, unintentional weight loss, skin rash, eye pain, sinus congestion and sinus pain, sore throat, dysphagia,  hemoptysis , cough, dyspnea, wheezing, chest pain, palpitations, orthopnea, edema,  nausea, melena, diarrhea, constipation, flank pain, dysuria, hematuria, urinary  Frequency, nocturia, numbness, tingling, seizures,  Focal weakness, Loss of consciousness,  Tremor,  insomnia, depression, anxiety, and suicidal ideation.      Objective:  BP 124/60 mmHg  Pulse 74  Temp(Src) 98.8 F (37.1 C) (Oral)  Resp 12  Ht 5\' 2"  (1.575 m)  Wt 125 lb (56.7 kg)  BMI 22.86 kg/m2  SpO2 96%  BP Readings from Last 3 Encounters:  02/18/15 150/70  02/17/15 124/60  12/28/14 120/60    Wt Readings from Last 3 Encounters:  02/18/15 125 lb (56.7 kg)  02/17/15 125 lb (56.7 kg)  12/28/14 127 lb 8 oz (57.834 kg)   General Appearance:    Alert, cooperative, no distress, appears stated age  Head:    Normocephalic, without obvious abnormality, atraumatic     Back:     Symmetric, no curvature, ROM normal,  no CVA tenderness  Lungs:     Clear to auscultation bilaterally, respirations unlabored  Chest Wall:    No tenderness or deformity   Heart:    Regular rate and rhythm, systolic murmur, no rub   or gallop     Abdomen:     Soft, non-tender, bowel sounds active all four quadrants,    no masses, no organomegaly  Genitalia:    Pelvic: cervix surgically absent,  Vagina atrophic,  normal in appearance, external genitalia normal, no adnexal masses or tenderness, urethral meatus with fresh blood   Extremities:   Extremities normal, atraumatic, no cyanosis or edema  Pulses:   2+ and symmetric all extremities  Skin:   2 mm nevus with irregular border right lateral forefoot  Lymph nodes:   Cervical, supraclavicular, and axillary nodes normal  Neurologic:   CNII-XII intact, normal strength, sensation and reflexes    throughout    Lab Results  Component Value Date   HGBA1C 7.3* 02/17/2015   HGBA1C 6.7* 08/13/2014   HGBA1C 7.1* 04/09/2014    Lab Results  Component Value Date   CREATININE 1.13 02/17/2015   CREATININE 1.11 08/13/2014   CREATININE 1.1 04/09/2014    Lab Results  Component Value Date   WBC 8.7 04/09/2014   HGB 10.8* 04/09/2014   HCT 32.1* 04/09/2014   PLT 249.0 04/09/2014   GLUCOSE 171* 02/17/2015   CHOL 136 02/17/2015   TRIG 113.0 02/17/2015   HDL 45.80 02/17/2015   LDLDIRECT 75.5 11/13/2013   LDLCALC 68 02/17/2015   ALT 26 02/17/2015   AST 24 02/17/2015   NA 139 02/17/2015   K 4.3 02/17/2015   CL 103 02/17/2015   CREATININE 1.13 02/17/2015   BUN 31* 02/17/2015   CO2 28 02/17/2015   TSH 4.09 08/13/2014   INR 1.2* 12/15/2013   HGBA1C 7.3* 02/17/2015   MICROALBUR 9.7* 02/17/2015    Mm Screening Breast Tomo Bilateral  09/15/2014  CLINICAL DATA:  Screening. EXAM: DIGITAL SCREENING BILATERAL MAMMOGRAM WITH 3D TOMO WITH CAD COMPARISON:  Previous exam(s). ACR Breast Density Category b: There are scattered areas of fibroglandular density. FINDINGS: There are no  findings suspicious for malignancy. Images were processed with CAD. IMPRESSION: No mammographic evidence of malignancy. A result letter of this screening mammogram will be mailed directly to the patient. RECOMMENDATION: Screening mammogram in one year. (Code:SM-B-01Y) BI-RADS CATEGORY  1: Negative. Electronically Signed   By: Lajean Manes M.D.   On: 09/15/2014 09:34    Assessment & Plan:   Problem List Items Addressed This Visit    Type II diabetes mellitus with nephropathy ([place])    Historically well-controlled on metformin only.  A1c is up slightly.  Patient is up-to-date on eye exams  and foot exam is normal today. Patient has minimally elevated  urine microalbumin to creatinine ratio a. Patient is not on statin therapy for CAD risk reduction given her age and is tolerating  ACE/ARB . No changes today given her age and rsk for hypoglycemia with greater control.   Lab Results  Component Value Date   HGBA1C 7.3* 02/17/2015   Lab Results  Component Value Date   MICROALBUR 9.7* 02/17/2015             UTI (urinary tract infection)    empiric antibiotics given appearance of UA and urethral irritation.  If symptoms continue will repeat with culture       Dysplastic nevus of lower extremity   Relevant Orders   Ambulatory referral to Dermatology    Other Visit Diagnoses    Suprapubic abdominal pain, unspecified laterality    -  Primary    Relevant Orders    Urine Culture       I am having Ms. Kolander start on ciprofloxacin. I am also having her maintain her Fish Oil, Multiple Vitamins-Minerals (ABC PLUS SENIOR PO), calcium carbonate, cholecalciferol, BIOTIN 5000 PO, ibuprofen, Probiotic Product (PHILLIPS COLON HEALTH PO), glucose blood, Lactulose, estrogens (conjugated), Calcium Carb-Cholecalciferol (CALCIUM 600 + D PO), HYDROcodone-acetaminophen, loratadine, metoprolol succinate, valsartan-hydrochlorothiazide, metFORMIN, and fenofibrate.  Meds ordered this encounter  Medications  .  ciprofloxacin (CIPRO) 250 MG tablet    Sig: Take 1 tablet (250 mg total) by mouth 2 (two) times daily.    Dispense:  10 tablet    Refill:  0  A total of 25 minutes of face to face time was spent with patient more than half of which was spent in counselling about the above mentioned conditions  and coordination of care   There are no discontinued medications.  Follow-up: No Follow-up on file.   [name], MD

## 2015-02-17 NOTE — Addendum Note (Signed)
Addended by: Karlene Einstein D on: 02/17/2015 11:45 AM   Modules accepted: Orders

## 2015-02-18 ENCOUNTER — Ambulatory Visit (INDEPENDENT_AMBULATORY_CARE_PROVIDER_SITE_OTHER): Payer: Medicare Other | Admitting: Cardiovascular Disease

## 2015-02-18 ENCOUNTER — Encounter: Payer: Self-pay | Admitting: Cardiovascular Disease

## 2015-02-18 VITALS — BP 150/70 | HR 73 | Ht 61.0 in | Wt 125.0 lb

## 2015-02-18 DIAGNOSIS — I350 Nonrheumatic aortic (valve) stenosis: Secondary | ICD-10-CM | POA: Diagnosis not present

## 2015-02-18 DIAGNOSIS — I10 Essential (primary) hypertension: Secondary | ICD-10-CM | POA: Diagnosis not present

## 2015-02-18 NOTE — Assessment & Plan Note (Signed)
Recommend a repeat echocardiogram in February 2017. She appears to be asymptomatic from this at the present time. Follow-up with me on a yearly basis or earlier if needed.

## 2015-02-18 NOTE — Progress Notes (Signed)
HPI  This is a very pleasant 81 year old female who is here for a follow up regarding aortic stenosis.  She has been relatively healthy throughout her life but does have hypertension, hyperlipidemia and type 2 diabetes.  Most recent echocardiogram in February, 2015 showed stable mild aortic stenosis with normal LV systolic function.  She has been under significant stress as her husband's health conditions continue to deteriorate. Otherwise, she has been doing well with no chest pain, shortness of breath or palpitations. She is suspected of having a UTI and was started on Cipro recently.  Allergies  Allergen Reactions  . Aspirin   . Atropine   . Belladonna Alkaloids   . Codeine   . Darvon   . Methocarbamol   . Penicillins   . Iron Other (See Comments)    High dose prescription gives her diarrhea     Current Outpatient Prescriptions on File Prior to Visit  Medication Sig Dispense Refill  . BIOTIN 5000 PO Take by mouth.      . Calcium Carb-Cholecalciferol (CALCIUM 600 + D PO) Take by mouth daily.    . calcium carbonate (TUMS) 500 MG chewable tablet Chew 1 tablet by mouth. Taken when needed    . cholecalciferol (VITAMIN D) 1000 UNITS tablet Take 1,000 Units by mouth daily.      . ciprofloxacin (CIPRO) 250 MG tablet Take 1 tablet (250 mg total) by mouth 2 (two) times daily. 10 tablet 0  . estrogens, conjugated, (PREMARIN) 0.3 MG tablet Take 0.3 mg by mouth daily. Take daily for 21 days then do not take for 7 days.    . fenofibrate 160 MG tablet TAKE 1 TABLET EVERY DAY FOR HIGH TRIGLYCERIDES 30 tablet 2  . glucose blood (BAYER CONTOUR TEST) test strip Use as instructed 100 each 5  . HYDROcodone-acetaminophen (NORCO/VICODIN) 5-325 MG per tablet Take 1 tablet by mouth every 6 (six) hours as needed for moderate pain. 60 tablet 0  . ibuprofen (ADVIL,MOTRIN) 200 MG tablet Take 200 mg by mouth as needed.      . Lactulose 20 GM/30ML SOLN Take 30 mLs (20 g total) by mouth as needed. To  releive constipation 240 mL 3  . loratadine (CLARITIN) 10 MG tablet Take 10 mg by mouth daily.    . metFORMIN (GLUCOPHAGE-XR) 750 MG 24 hr tablet TAKE 1 TABLET EVERY DAY WITH BREAKFAST 30 tablet 6  . metoprolol succinate (TOPROL-XL) 25 MG 24 hr tablet TAKE 1 TABLET EVERY DAY 30 tablet 5  . Multiple Vitamins-Minerals (ABC PLUS SENIOR PO) Take by mouth.      . Omega-3 Fatty Acids (FISH OIL) 1200 MG CAPS Take by mouth.      . Probiotic Product (PHILLIPS COLON HEALTH PO) Take 1 tablet by mouth daily.      . valsartan-hydrochlorothiazide (DIOVAN-HCT) 320-25 MG per tablet TAKE 1 TABLET EVERY DAY USUALLY IN THE MORNING 30 tablet 5   No current facility-administered medications on file prior to visit.     Past Medical History  Diagnosis Date  . Hypertension   . Diabetes mellitus   . Hyperlipidemia   . Viral gastroenteritis due to [place]-like agent Nov 2012     Past Surgical History  Procedure Laterality Date  . Bladder surgery    . Abdominal hysterectomy    . Cardiac catheterization    . Cholecystectomy       Family History  Problem Relation Age of Onset  . Cancer Mother   . Breast cancer Mother  34  . Breast cancer Sister 55     Social History   Social History  . Marital Status: Married    Spouse Name: N/A  . Number of Children: N/A  . Years of Education: N/A   Occupational History  . Not on file.   Social History Main Topics  . Smoking status: Never Smoker   . Smokeless tobacco: Never Used  . Alcohol Use: No  . Drug Use: No  . Sexual Activity: Not on file   Other Topics Concern  . Not on file   Social History Narrative        PHYSICAL EXAM   BP 150/70 mmHg  Pulse 73  Ht 5\' 1"  (1.549 m)  Wt 125 lb (56.7 kg)  BMI 23.63 kg/m2 Constitutional: She is oriented to person, place, and time. She appears well-developed and well-nourished. No distress.  HENT: No nasal discharge.  Head: Normocephalic and atraumatic.  Eyes: Pupils are equal and round. Right  eye exhibits no discharge. Left eye exhibits no discharge.  Neck: Normal range of motion. Neck supple. No JVD present. No thyromegaly present.  Cardiovascular: Normal rate, regular rhythm. Exam reveals no gallop and no friction rub. There is a 3/6 crescendo decrescendo murmur in the aortic area which is mid peaking with slightly diminished S2. Pulmonary/Chest: Effort normal and breath sounds normal. No stridor. No respiratory distress. She has no wheezes. She has no rales. She exhibits no tenderness.  Abdominal: Soft. Bowel sounds are normal. She exhibits no distension. There is no tenderness. There is no rebound and no guarding.  Musculoskeletal: Normal range of motion. She exhibits no edema and no tenderness.  Neurological: She is alert and oriented to person, place, and time. Coordination normal.  Skin: Skin is warm and dry. No rash noted. She is not diaphoretic. No erythema. No pallor.  Psychiatric: She has a normal mood and affect. Her behavior is normal. Judgment and thought content normal.     EKG: Sinus  Rhythm  Low voltage in precordial leads.   ABNORMAL    ASSESSMENT AND PLAN

## 2015-02-18 NOTE — Assessment & Plan Note (Signed)
Blood pressure is reasonably controlled on current medications. 

## 2015-02-18 NOTE — Patient Instructions (Signed)
Medication Instructions:  Your physician recommends that you continue on your current medications as directed. Please refer to the Current Medication list given to you today.   Labwork: none  Testing/Procedures: Your physician has requested that you have an echocardiogram in February. Echocardiography is a painless test that uses sound waves to create images of your heart. It provides your doctor with information about the size and shape of your heart and how well your heart's chambers and valves are working. This procedure takes approximately one hour. There are no restrictions for this procedure.    Follow-Up: Your physician wants you to follow-up in: one year with Dr. Zannie Cove will receive a reminder letter in the mail two months in advance. If you don't receive a letter, please call our office to schedule the follow-up appointment.   Any Other Special Instructions Will Be Listed Below (If Applicable).

## 2015-02-19 DIAGNOSIS — N390 Urinary tract infection, site not specified: Secondary | ICD-10-CM | POA: Insufficient documentation

## 2015-02-19 NOTE — Assessment & Plan Note (Addendum)
empiric antibiotics given appearance of UA and urethral irritation.  If symptoms continue will repeat with culture

## 2015-02-20 DIAGNOSIS — D2370 Other benign neoplasm of skin of unspecified lower limb, including hip: Secondary | ICD-10-CM | POA: Insufficient documentation

## 2015-02-20 NOTE — Assessment & Plan Note (Addendum)
Historically well-controlled on metformin only.  A1c is up slightly.  Patient is up-to-date on eye exams and foot exam is normal today. Patient has minimally elevated  urine microalbumin to creatinine ratio a. Patient is not on statin therapy for CAD risk reduction given her age and is tolerating  ACE/ARB . No changes today given her age and rsk for hypoglycemia with greater control.   Lab Results  Component Value Date   HGBA1C 7.3* 02/17/2015   Lab Results  Component Value Date   MICROALBUR 9.7* 02/17/2015

## 2015-02-23 ENCOUNTER — Telehealth: Payer: Self-pay | Admitting: Internal Medicine

## 2015-02-23 DIAGNOSIS — D508 Other iron deficiency anemias: Secondary | ICD-10-CM

## 2015-02-23 DIAGNOSIS — E538 Deficiency of other specified B group vitamins: Secondary | ICD-10-CM

## 2015-02-23 NOTE — Telephone Encounter (Signed)
Patient DPR called [name]) concerning patient has finished ABX but DPR wanted MD to know patient seems tired or "down in the dumps" wanted to know if MD thought maybe hemoglobin needs checking. Patient not aware.

## 2015-02-24 NOTE — Telephone Encounter (Signed)
Absolutely,, labs ordered, including b12 and iron

## 2015-02-24 NOTE — Telephone Encounter (Signed)
Lab scheduled patient notified. 

## 2015-02-24 NOTE — Telephone Encounter (Signed)
Left message for DPR to call office.

## 2015-02-25 ENCOUNTER — Other Ambulatory Visit (INDEPENDENT_AMBULATORY_CARE_PROVIDER_SITE_OTHER): Payer: Medicare Other

## 2015-02-25 ENCOUNTER — Other Ambulatory Visit: Payer: Self-pay | Admitting: Internal Medicine

## 2015-02-25 DIAGNOSIS — R1030 Lower abdominal pain, unspecified: Secondary | ICD-10-CM | POA: Diagnosis not present

## 2015-02-25 DIAGNOSIS — Z9889 Other specified postprocedural states: Secondary | ICD-10-CM

## 2015-02-25 DIAGNOSIS — E538 Deficiency of other specified B group vitamins: Secondary | ICD-10-CM | POA: Diagnosis not present

## 2015-02-25 DIAGNOSIS — R1031 Right lower quadrant pain: Secondary | ICD-10-CM | POA: Diagnosis not present

## 2015-02-25 DIAGNOSIS — D508 Other iron deficiency anemias: Secondary | ICD-10-CM

## 2015-02-25 DIAGNOSIS — R109 Unspecified abdominal pain: Secondary | ICD-10-CM | POA: Diagnosis not present

## 2015-02-25 LAB — IBC PANEL
Iron: 62 ug/dL (ref 42–145)
SATURATION RATIOS: 16.1 % — AB (ref 20.0–50.0)
TRANSFERRIN: 275 mg/dL (ref 212.0–360.0)

## 2015-02-25 LAB — CBC WITH DIFFERENTIAL/PLATELET
Basophils Absolute: 0 10*3/uL (ref 0.0–0.1)
Basophils Relative: 0.4 % (ref 0.0–3.0)
EOS ABS: 0.2 10*3/uL (ref 0.0–0.7)
EOS PCT: 2.4 % (ref 0.0–5.0)
HCT: 31.7 % — ABNORMAL LOW (ref 36.0–46.0)
HEMOGLOBIN: 10.6 g/dL — AB (ref 12.0–15.0)
Lymphocytes Relative: 23.9 % (ref 12.0–46.0)
Lymphs Abs: 1.9 10*3/uL (ref 0.7–4.0)
MCHC: 33.3 g/dL (ref 30.0–36.0)
MCV: 85.7 fl (ref 78.0–100.0)
Monocytes Absolute: 0.6 10*3/uL (ref 0.1–1.0)
Monocytes Relative: 7.8 % (ref 3.0–12.0)
Neutro Abs: 5.2 10*3/uL (ref 1.4–7.7)
Neutrophils Relative %: 65.5 % (ref 43.0–77.0)
Platelets: 258 10*3/uL (ref 150.0–400.0)
RBC: 3.71 Mil/uL — ABNORMAL LOW (ref 3.87–5.11)
RDW: 13.9 % (ref 11.5–15.5)
WBC: 7.9 10*3/uL (ref 4.0–10.5)

## 2015-02-25 LAB — URINALYSIS, ROUTINE W REFLEX MICROSCOPIC
Bilirubin Urine: NEGATIVE
Ketones, ur: NEGATIVE
NITRITE: NEGATIVE
PH: 6 (ref 5.0–8.0)
RBC / HPF: NONE SEEN (ref 0–?)
SPECIFIC GRAVITY, URINE: 1.02 (ref 1.000–1.030)
Total Protein, Urine: NEGATIVE
Urine Glucose: NEGATIVE
Urobilinogen, UA: 0.2 (ref 0.0–1.0)

## 2015-02-25 LAB — VITAMIN B12: VITAMIN B 12: 756 pg/mL (ref 211–911)

## 2015-02-25 LAB — FERRITIN: FERRITIN: 66.6 ng/mL (ref 10.0–291.0)

## 2015-02-25 NOTE — Addendum Note (Signed)
Addended by: Karlene Einstein D on: 02/25/2015 02:54 PM   Modules accepted: Orders

## 2015-02-26 LAB — URINE CULTURE: Colony Count: 50000

## 2015-02-28 NOTE — Addendum Note (Signed)
Addended by: [name] on: 02/28/2015 02:19 PM   Modules accepted: Orders

## 2015-03-02 ENCOUNTER — Other Ambulatory Visit: Payer: Self-pay | Admitting: Internal Medicine

## 2015-03-08 ENCOUNTER — Other Ambulatory Visit: Payer: Self-pay | Admitting: Internal Medicine

## 2015-04-13 ENCOUNTER — Other Ambulatory Visit: Payer: Self-pay | Admitting: Internal Medicine

## 2015-04-15 DIAGNOSIS — N3941 Urge incontinence: Secondary | ICD-10-CM | POA: Diagnosis not present

## 2015-04-15 DIAGNOSIS — R1030 Lower abdominal pain, unspecified: Secondary | ICD-10-CM | POA: Diagnosis not present

## 2015-05-19 ENCOUNTER — Ambulatory Visit (INDEPENDENT_AMBULATORY_CARE_PROVIDER_SITE_OTHER): Payer: Medicare Other | Admitting: Internal Medicine

## 2015-05-19 ENCOUNTER — Encounter: Payer: Self-pay | Admitting: Internal Medicine

## 2015-05-19 VITALS — BP 140/70 | HR 73 | Temp 97.9°F | Resp 12 | Ht 61.0 in | Wt 127.5 lb

## 2015-05-19 DIAGNOSIS — D509 Iron deficiency anemia, unspecified: Secondary | ICD-10-CM | POA: Diagnosis not present

## 2015-05-19 DIAGNOSIS — E1121 Type 2 diabetes mellitus with diabetic nephropathy: Secondary | ICD-10-CM | POA: Diagnosis not present

## 2015-05-19 DIAGNOSIS — D2272 Melanocytic nevi of left lower limb, including hip: Secondary | ICD-10-CM

## 2015-05-19 DIAGNOSIS — D2372 Other benign neoplasm of skin of left lower limb, including hip: Secondary | ICD-10-CM

## 2015-05-19 NOTE — Patient Instructions (Addendum)
Your labs are due to be repeated tomorrow or later (NOT TODAY, INSURANCE WON'T PAY FOR THEM A DAY EARLY)   Let's try giving Dad 1/2 tablet of quetiapine if he wakes up moaning ,  Or you CAN INCREASE DOSE TO 2 TABLETS AT BEDTIME

## 2015-05-19 NOTE — Progress Notes (Signed)
Pre-visit discussion using our clinic review tool. No additional management support is needed unless otherwise documented below in the visit note.  

## 2015-05-19 NOTE — Progress Notes (Signed)
Subjective:  Patient ID: Maureen Morales, female    DOB: 27-Jun-1922  Age: 82 y.o. MRN: [id number]  CC: The primary encounter diagnosis was Atypical nevus of left ankle. Diagnoses of Anemia, iron deficiency, Type II diabetes mellitus with nephropathy ([place]), and Dysplastic nevus of lower extremity, left were also pertinent to this visit.  HPI Maureen Morales presents for 3 month follow up on diabetes.  Patient has no complaints today.  Patient is following a low glycemic index diet and taking all prescribed medications regularly without side effects.  Fasting sugars have been under less than 140 most of the time and post prandials have been under 160 except on rare occasions. Patient is exercising about 3 times per week and intentionally trying to lose weight .  Patient has had an eye exam in the last 12 months and checks feet regularly for signs of infection.  Patient does not walk barefoot outside,  And denies an numbness tingling or burning in feet. Patient is up to date on all recommended vaccinations.  SHE IS DUE FOR EYE EXAM .  She remains very active daily in her 2 story home and climbs a flight of stairs several times daily .  She demonstrated the ability to rise from a chair without using her hands multiple times without tiring .  She has had no falls.      Outpatient Prescriptions Prior to Visit  Medication Sig Dispense Refill  . BIOTIN 5000 PO Take by mouth.      . Calcium Carb-Cholecalciferol (CALCIUM 600 + D PO) Take by mouth daily.    . calcium carbonate (TUMS) 500 MG chewable tablet Chew 1 tablet by mouth. Taken when needed    . cholecalciferol (VITAMIN D) 1000 UNITS tablet Take 1,000 Units by mouth daily.      . ciprofloxacin (CIPRO) 250 MG tablet Take 1 tablet (250 mg total) by mouth 2 (two) times daily. 10 tablet 0  . estrogens, conjugated, (PREMARIN) 0.3 MG tablet Take 0.3 mg by mouth daily. Take daily for 21 days then do not take for 7 days.    . fenofibrate 160 MG tablet TAKE 1  TABLET EVERY DAY FOR HIGH TRIGLYCERIDES 30 tablet 3  . glucose blood (BAYER CONTOUR TEST) test strip Use as instructed 100 each 5  . HYDROcodone-acetaminophen (NORCO/VICODIN) 5-325 MG per tablet Take 1 tablet by mouth every 6 (six) hours as needed for moderate pain. 60 tablet 0  . ibuprofen (ADVIL,MOTRIN) 200 MG tablet Take 200 mg by mouth as needed.      . Lactulose 20 GM/30ML SOLN Take 30 mLs (20 g total) by mouth as needed. To releive constipation 240 mL 3  . loratadine (CLARITIN) 10 MG tablet Take 10 mg by mouth daily.    . metFORMIN (GLUCOPHAGE-XR) 750 MG 24 hr tablet TAKE 1 TABLET EVERY DAY WITH BREAKFAST 30 tablet 6  . metoprolol succinate (TOPROL-XL) 25 MG 24 hr tablet TAKE 1 TABLET EVERY DAY 30 tablet 3  . Multiple Vitamins-Minerals (ABC PLUS SENIOR PO) Take by mouth.      . Omega-3 Fatty Acids (FISH OIL) 1200 MG CAPS Take by mouth.      . Probiotic Product (PHILLIPS COLON HEALTH PO) Take 1 tablet by mouth daily.      . valsartan-hydrochlorothiazide (DIOVAN-HCT) 320-25 MG tablet TAKE 1 TABLET EVERY DAY USUALLY IN THE MORNING 30 tablet 6   No facility-administered medications prior to visit.    Review of Systems;  Patient denies headache, fevers,  malaise, unintentional weight loss, skin rash, eye pain, sinus congestion and sinus pain, sore throat, dysphagia,  hemoptysis , cough, dyspnea, wheezing, chest pain, palpitations, orthopnea, edema, abdominal pain, nausea, melena, diarrhea, constipation, flank pain, dysuria, hematuria, urinary  Frequency, nocturia, numbness, tingling, seizures,  Focal weakness, Loss of consciousness,  Tremor, insomnia, depression, anxiety, and suicidal ideation.      Objective:  BP 140/70 mmHg  Pulse 73  Temp(Src) 97.9 F (36.6 C) (Oral)  Resp 12  Ht 5\' 1"  (1.549 m)  Wt 127 lb 8 oz (57.834 kg)  BMI 24.10 kg/m2  SpO2 100%  BP Readings from Last 3 Encounters:  05/19/15 140/70  02/18/15 150/70  02/17/15 124/60    Wt Readings from Last 3 Encounters:   05/19/15 127 lb 8 oz (57.834 kg)  02/18/15 125 lb (56.7 kg)  02/17/15 125 lb (56.7 kg)    General appearance: alert, cooperative and appears stated age Ears: normal TM's and external ear canals both ears Throat: lips, mucosa, and tongue normal; teeth and gums normal Neck: no adenopathy, no carotid bruit, supple, symmetrical, trachea midline and thyroid not enlarged, symmetric, no tenderness/mass/nodules Back: symmetric, no curvature. ROM normal. No CVA tenderness. Lungs: clear to auscultation bilaterally Heart: regular rate and rhythm, S1, S2 normal, no murmur, click, rub or gallop Abdomen: soft, non-tender; bowel sounds normal; no masses,  no organomegaly Pulses: 2+ and symmetric Skin: Skin color, texture, turgor normal. NEW ASYMMETRIC M[place]  ON LEFT SHIN near the ankle. Irregular border Foot exam:  Nails are well trimmed,  No callouses,  Sensation intact to microfilament Lymph nodes: Cervical, supraclavicular, and axillary nodes normal.   Lab Results  Component Value Date   HGBA1C 7.3* 02/17/2015   HGBA1C 6.7* 08/13/2014   HGBA1C 7.1* 04/09/2014    Lab Results  Component Value Date   CREATININE 1.13 02/17/2015   CREATININE 1.11 08/13/2014   CREATININE 1.1 04/09/2014    Lab Results  Component Value Date   WBC 7.9 02/25/2015   HGB 10.6* 02/25/2015   HCT 31.7* 02/25/2015   PLT 258.0 02/25/2015   GLUCOSE 171* 02/17/2015   CHOL 136 02/17/2015   TRIG 113.0 02/17/2015   HDL 45.80 02/17/2015   LDLDIRECT 75.5 11/13/2013   LDLCALC 68 02/17/2015   ALT 26 02/17/2015   AST 24 02/17/2015   NA 139 02/17/2015   K 4.3 02/17/2015   CL 103 02/17/2015   CREATININE 1.13 02/17/2015   BUN 31* 02/17/2015   CO2 28 02/17/2015   TSH 4.09 08/13/2014   INR 1.2* 12/15/2013   HGBA1C 7.3* 02/17/2015   MICROALBUR 9.7* 02/17/2015    Mm Screening Breast Tomo Bilateral  09/15/2014  CLINICAL DATA:  Screening. EXAM: DIGITAL SCREENING BILATERAL MAMMOGRAM WITH 3D TOMO WITH CAD COMPARISON:   Previous exam(s). ACR Breast Density Category b: There are scattered areas of fibroglandular density. FINDINGS: There are no findings suspicious for malignancy. Images were processed with CAD. IMPRESSION: No mammographic evidence of malignancy. A result letter of this screening mammogram will be mailed directly to the patient. RECOMMENDATION: Screening mammogram in one year. (Code:SM-B-01Y) BI-RADS CATEGORY  1: Negative. Electronically Signed   By: Lajean Manes M.D.   On: 09/15/2014 09:34    Assessment & Plan:   Problem List Items Addressed This Visit    Type II diabetes mellitus with nephropathy ([place])    Historically well-controlled on metformin only.  A1c was up slightly at last visit but given her age no medication changes were made. .  Patient is up-to-date  on eye exams and foot exam is normal today. Patient has minimally elevated  urine microalbumin to creatinine ratio a. Patient is not on statin therapy for CAD risk reduction given her age and is tolerating  ACE/ARB .   Lab Results  Component Value Date   HGBA1C 7.3* 02/17/2015   Lab Results  Component Value Date   MICROALBUR 9.7* 02/17/2015               Relevant Orders   Comprehensive metabolic panel   Hemoglobin A1c   LDL cholesterol, direct   Anemia, iron deficiency    Mild but persistent.  No further workup per GI evaluation. Advised to take ferrous sulfate once daily for 3 months.   Lab Results  Component Value Date   WBC 7.9 02/25/2015   HGB 10.6* 02/25/2015   HCT 31.7* 02/25/2015   MCV 85.7 02/25/2015   PLT 258.0 02/25/2015   Lab Results  Component Value Date   IRON 62 02/25/2015   TIBC 343 04/09/2014   FERRITIN 66.6 02/25/2015         Relevant Orders   CBC with Differential/Platelet   Ferritin   IBC panel   Dysplastic nevus of lower extremity    Referral to Dermatology for evaluation       Other Visit Diagnoses    Atypical nevus of left ankle    -  Primary    Relevant Orders    Ambulatory  referral to Dermatology      A total of 25 minutes of face to face time was spent with patient more than half of which was spent in counselling about the above mentioned conditions  and coordination of care    I am having Ms. Rigdon maintain her Fish Oil, Multiple Vitamins-Minerals (ABC PLUS SENIOR PO), calcium carbonate, cholecalciferol, BIOTIN 5000 PO, ibuprofen, Probiotic Product ([place]), glucose blood, Lactulose, estrogens (conjugated), Calcium Carb-Cholecalciferol (CALCIUM 600 + D PO), HYDROcodone-acetaminophen, loratadine, metFORMIN, ciprofloxacin, metoprolol succinate, fenofibrate, valsartan-hydrochlorothiazide, and mirabegron ER.  Meds ordered this encounter  Medications  . mirabegron ER (MYRBETRIQ) 50 MG TB24 tablet    Sig: Take by mouth.    There are no discontinued medications.  Follow-up: No Follow-up on file.   [name], MD

## 2015-05-22 NOTE — Assessment & Plan Note (Signed)
Referral to Dermatology for evaluation

## 2015-05-22 NOTE — Assessment & Plan Note (Signed)
Historically well-controlled on metformin only.  A1c was up slightly at last visit but given her age no medication changes were made. .  Patient is up-to-date on eye exams and foot exam is normal today. Patient has minimally elevated  urine microalbumin to creatinine ratio a. Patient is not on statin therapy for CAD risk reduction given her age and is tolerating  ACE/ARB .   Lab Results  Component Value Date   HGBA1C 7.3* 02/17/2015   Lab Results  Component Value Date   MICROALBUR 9.7* 02/17/2015

## 2015-05-22 NOTE — Assessment & Plan Note (Signed)
Mild but persistent.  No further workup per GI evaluation. Advised to take ferrous sulfate once daily for 3 months.   Lab Results  Component Value Date   WBC 7.9 02/25/2015   HGB 10.6* 02/25/2015   HCT 31.7* 02/25/2015   MCV 85.7 02/25/2015   PLT 258.0 02/25/2015   Lab Results  Component Value Date   IRON 62 02/25/2015   TIBC 343 04/09/2014   FERRITIN 66.6 02/25/2015

## 2015-05-25 DIAGNOSIS — L578 Other skin changes due to chronic exposure to nonionizing radiation: Secondary | ICD-10-CM | POA: Diagnosis not present

## 2015-05-25 DIAGNOSIS — X32XXXA Exposure to sunlight, initial encounter: Secondary | ICD-10-CM | POA: Diagnosis not present

## 2015-05-26 ENCOUNTER — Other Ambulatory Visit (INDEPENDENT_AMBULATORY_CARE_PROVIDER_SITE_OTHER): Payer: Medicare Other

## 2015-05-26 DIAGNOSIS — D509 Iron deficiency anemia, unspecified: Secondary | ICD-10-CM

## 2015-05-26 DIAGNOSIS — E785 Hyperlipidemia, unspecified: Secondary | ICD-10-CM | POA: Diagnosis not present

## 2015-05-26 DIAGNOSIS — E1121 Type 2 diabetes mellitus with diabetic nephropathy: Secondary | ICD-10-CM | POA: Diagnosis not present

## 2015-05-26 LAB — CBC WITH DIFFERENTIAL/PLATELET
BASOS ABS: 0 10*3/uL (ref 0.0–0.1)
Basophils Relative: 0.7 % (ref 0.0–3.0)
Eosinophils Absolute: 0.2 10*3/uL (ref 0.0–0.7)
Eosinophils Relative: 3.4 % (ref 0.0–5.0)
HCT: 31.9 % — ABNORMAL LOW (ref 36.0–46.0)
Hemoglobin: 10.6 g/dL — ABNORMAL LOW (ref 12.0–15.0)
LYMPHS ABS: 1.1 10*3/uL (ref 0.7–4.0)
Lymphocytes Relative: 19.4 % (ref 12.0–46.0)
MCHC: 33.3 g/dL (ref 30.0–36.0)
MCV: 86.4 fl (ref 78.0–100.0)
MONO ABS: 0.8 10*3/uL (ref 0.1–1.0)
Monocytes Relative: 13.7 % — ABNORMAL HIGH (ref 3.0–12.0)
NEUTROS ABS: 3.5 10*3/uL (ref 1.4–7.7)
NEUTROS PCT: 62.8 % (ref 43.0–77.0)
PLATELETS: 254 10*3/uL (ref 150.0–400.0)
RBC: 3.7 Mil/uL — AB (ref 3.87–5.11)
RDW: 13.7 % (ref 11.5–15.5)
WBC: 5.6 10*3/uL (ref 4.0–10.5)

## 2015-05-26 LAB — COMPREHENSIVE METABOLIC PANEL
ALBUMIN: 4.1 g/dL (ref 3.5–5.2)
ALK PHOS: 59 U/L (ref 39–117)
ALT: 30 U/L (ref 0–35)
AST: 28 U/L (ref 0–37)
BILIRUBIN TOTAL: 0.3 mg/dL (ref 0.2–1.2)
BUN: 32 mg/dL — AB (ref 6–23)
CALCIUM: 9.6 mg/dL (ref 8.4–10.5)
CO2: 30 mEq/L (ref 19–32)
CREATININE: 1.18 mg/dL (ref 0.40–1.20)
Chloride: 100 mEq/L (ref 96–112)
GFR: 45.71 mL/min — ABNORMAL LOW (ref >60.00)
Glucose, Bld: 191 mg/dL — ABNORMAL HIGH (ref 70–99)
Potassium: 3.9 mEq/L (ref 3.5–5.1)
SODIUM: 138 meq/L (ref 135–145)
TOTAL PROTEIN: 6.8 g/dL (ref 6.0–8.3)

## 2015-05-26 LAB — LDL CHOLESTEROL, DIRECT: LDL DIRECT: 74 mg/dL

## 2015-05-26 LAB — IBC PANEL
Iron: 42 ug/dL (ref 42–145)
SATURATION RATIOS: 11.9 % — AB (ref 20.0–50.0)
TRANSFERRIN: 253 mg/dL (ref 212.0–360.0)

## 2015-05-26 LAB — HEMOGLOBIN A1C: HEMOGLOBIN A1C: 7.4 % — AB (ref 4.6–6.5)

## 2015-05-26 LAB — FERRITIN: FERRITIN: 96.9 ng/mL (ref 10.0–291.0)

## 2015-05-28 ENCOUNTER — Telehealth: Payer: Self-pay | Admitting: Internal Medicine

## 2015-05-28 MED ORDER — LEVOFLOXACIN 500 MG PO TABS: 500.0000 mg | ORAL_TABLET | Freq: Every day | ORAL | Status: DC

## 2015-05-28 MED ORDER — PREDNISONE 10 MG PO TABS: ORAL_TABLET | ORAL | Status: DC

## 2015-06-03 ENCOUNTER — Telehealth: Payer: Self-pay | Admitting: Internal Medicine

## 2015-06-03 DIAGNOSIS — N3941 Urge incontinence: Secondary | ICD-10-CM | POA: Diagnosis not present

## 2015-06-03 NOTE — Telephone Encounter (Signed)
Your diabetes remains under excellent control currently. .Please continue your current medications and return in 3 months for follow up on diabetes and make sure you are seeing your eye doctor at least once a year.

## 2015-06-04 ENCOUNTER — Other Ambulatory Visit: Payer: Self-pay | Admitting: Internal Medicine

## 2015-06-04 MED ORDER — PREDNISONE 10 MG PO TABS: ORAL_TABLET | ORAL | Status: DC

## 2015-06-04 NOTE — Telephone Encounter (Signed)
Patient notified and voiced understanding.

## 2015-06-21 ENCOUNTER — Ambulatory Visit (INDEPENDENT_AMBULATORY_CARE_PROVIDER_SITE_OTHER): Payer: Medicare Other

## 2015-06-21 ENCOUNTER — Other Ambulatory Visit: Payer: Self-pay

## 2015-06-21 DIAGNOSIS — I350 Nonrheumatic aortic (valve) stenosis: Secondary | ICD-10-CM | POA: Diagnosis not present

## 2015-07-02 ENCOUNTER — Other Ambulatory Visit: Payer: Self-pay | Admitting: Internal Medicine

## 2015-07-09 ENCOUNTER — Other Ambulatory Visit: Payer: Self-pay | Admitting: Internal Medicine

## 2015-07-20 DIAGNOSIS — H2513 Age-related nuclear cataract, bilateral: Secondary | ICD-10-CM | POA: Diagnosis not present

## 2015-08-17 ENCOUNTER — Other Ambulatory Visit: Payer: Self-pay | Admitting: Internal Medicine

## 2015-08-17 DIAGNOSIS — I10 Essential (primary) hypertension: Secondary | ICD-10-CM

## 2015-08-17 DIAGNOSIS — E538 Deficiency of other specified B group vitamins: Secondary | ICD-10-CM

## 2015-08-17 DIAGNOSIS — D509 Iron deficiency anemia, unspecified: Secondary | ICD-10-CM

## 2015-08-17 DIAGNOSIS — E1121 Type 2 diabetes mellitus with diabetic nephropathy: Secondary | ICD-10-CM

## 2015-08-17 DIAGNOSIS — R5383 Other fatigue: Secondary | ICD-10-CM

## 2015-08-17 NOTE — Progress Notes (Signed)
Left message that I will return call patient not at home currently. Left voicemail to return cal and schedule lab appointment for next week Will try at a later time.

## 2015-08-26 ENCOUNTER — Ambulatory Visit: Payer: Medicare Other | Admitting: Internal Medicine

## 2015-08-27 ENCOUNTER — Other Ambulatory Visit (INDEPENDENT_AMBULATORY_CARE_PROVIDER_SITE_OTHER): Payer: Medicare Other

## 2015-08-27 DIAGNOSIS — I10 Essential (primary) hypertension: Secondary | ICD-10-CM | POA: Diagnosis not present

## 2015-08-27 DIAGNOSIS — R5383 Other fatigue: Secondary | ICD-10-CM | POA: Diagnosis not present

## 2015-08-27 DIAGNOSIS — D509 Iron deficiency anemia, unspecified: Secondary | ICD-10-CM | POA: Diagnosis not present

## 2015-08-27 DIAGNOSIS — E1121 Type 2 diabetes mellitus with diabetic nephropathy: Secondary | ICD-10-CM | POA: Diagnosis not present

## 2015-08-27 LAB — COMPREHENSIVE METABOLIC PANEL
ALK PHOS: 42 U/L (ref 39–117)
ALT: 27 U/L (ref 0–35)
AST: 23 U/L (ref 0–37)
Albumin: 4.2 g/dL (ref 3.5–5.2)
BILIRUBIN TOTAL: 0.4 mg/dL (ref 0.2–1.2)
BUN: 34 mg/dL — ABNORMAL HIGH (ref 6–23)
CALCIUM: 10.1 mg/dL (ref 8.4–10.5)
CO2: 28 meq/L (ref 19–32)
CREATININE: 1.18 mg/dL (ref 0.40–1.20)
Chloride: 101 mEq/L (ref 96–112)
GFR: 45.69 mL/min — AB (ref >60.00)
GLUCOSE: 170 mg/dL — AB (ref 70–99)
Potassium: 4.1 mEq/L (ref 3.5–5.1)
Sodium: 137 mEq/L (ref 135–145)
TOTAL PROTEIN: 6.9 g/dL (ref 6.0–8.3)

## 2015-08-27 LAB — MICROALBUMIN / CREATININE URINE RATIO
Creatinine,U: 84.2 mg/dL
MICROALB UR: 12.7 mg/dL — AB (ref 0.0–1.9)
Microalb Creat Ratio: 15.1 mg/g (ref 0.0–30.0)

## 2015-08-27 LAB — CBC WITH DIFFERENTIAL/PLATELET
BASOS PCT: 0.4 % (ref 0.0–3.0)
Basophils Absolute: 0 10*3/uL (ref 0.0–0.1)
EOS ABS: 0.8 10*3/uL — AB (ref 0.0–0.7)
EOS PCT: 9.5 % — AB (ref 0.0–5.0)
HEMATOCRIT: 31.4 % — AB (ref 36.0–46.0)
HEMOGLOBIN: 10.8 g/dL — AB (ref 12.0–15.0)
Lymphocytes Relative: 21.4 % (ref 12.0–46.0)
Lymphs Abs: 1.8 10*3/uL (ref 0.7–4.0)
MCHC: 34.5 g/dL (ref 30.0–36.0)
MCV: 84.8 fl (ref 78.0–100.0)
Monocytes Absolute: 0.5 10*3/uL (ref 0.1–1.0)
Monocytes Relative: 6.6 % (ref 3.0–12.0)
NEUTROS ABS: 5.1 10*3/uL (ref 1.4–7.7)
Neutrophils Relative %: 62.1 % (ref 43.0–77.0)
PLATELETS: 265 10*3/uL (ref 150.0–400.0)
RBC: 3.7 Mil/uL — ABNORMAL LOW (ref 3.87–5.11)
RDW: 14 % (ref 11.5–15.5)
WBC: 8.3 10*3/uL (ref 4.0–10.5)

## 2015-08-27 LAB — LDL CHOLESTEROL, DIRECT: LDL DIRECT: 87 mg/dL

## 2015-08-27 LAB — HEMOGLOBIN A1C: Hgb A1c MFr Bld: 7.8 % — ABNORMAL HIGH (ref 4.6–6.5)

## 2015-08-27 LAB — TSH: TSH: 3.76 u[IU]/mL (ref 0.35–4.50)

## 2015-08-27 LAB — VITAMIN B12: Vitamin B-12: 630 pg/mL (ref 211–911)

## 2015-08-31 ENCOUNTER — Encounter: Payer: Self-pay | Admitting: *Deleted

## 2015-09-08 ENCOUNTER — Ambulatory Visit: Payer: Medicare Other | Admitting: Internal Medicine

## 2015-09-24 ENCOUNTER — Other Ambulatory Visit: Payer: Self-pay | Admitting: Internal Medicine

## 2015-09-24 DIAGNOSIS — Z1231 Encounter for screening mammogram for malignant neoplasm of breast: Secondary | ICD-10-CM

## 2015-10-06 ENCOUNTER — Ambulatory Visit
Admission: RE | Admit: 2015-10-06 | Discharge: 2015-10-06 | Disposition: A | Discharge status: Home / Self Care | Payer: Medicare Other | Source: Ambulatory Visit | Attending: Internal Medicine | Admitting: Internal Medicine

## 2015-10-06 ENCOUNTER — Other Ambulatory Visit: Payer: Self-pay | Admitting: Internal Medicine

## 2015-10-06 DIAGNOSIS — Z1231 Encounter for screening mammogram for malignant neoplasm of breast: Secondary | ICD-10-CM | POA: Insufficient documentation

## 2015-11-10 ENCOUNTER — Other Ambulatory Visit: Payer: Self-pay | Admitting: Internal Medicine

## 2015-11-10 MED ORDER — VALSARTAN-HYDROCHLOROTHIAZIDE 320-25 MG PO TABS: ORAL_TABLET | ORAL | Status: DC

## 2015-11-22 ENCOUNTER — Other Ambulatory Visit: Payer: Self-pay | Admitting: Internal Medicine

## 2015-11-22 DIAGNOSIS — E1121 Type 2 diabetes mellitus with diabetic nephropathy: Secondary | ICD-10-CM

## 2015-11-22 DIAGNOSIS — I10 Essential (primary) hypertension: Secondary | ICD-10-CM

## 2015-11-22 DIAGNOSIS — E785 Hyperlipidemia, unspecified: Secondary | ICD-10-CM

## 2015-11-22 DIAGNOSIS — D509 Iron deficiency anemia, unspecified: Secondary | ICD-10-CM

## 2015-11-23 ENCOUNTER — Other Ambulatory Visit: Payer: Self-pay | Admitting: Internal Medicine

## 2015-11-25 ENCOUNTER — Other Ambulatory Visit: Payer: Self-pay | Admitting: Internal Medicine

## 2015-11-25 DIAGNOSIS — E559 Vitamin D deficiency, unspecified: Secondary | ICD-10-CM

## 2015-11-26 DIAGNOSIS — L814 Other melanin hyperpigmentation: Secondary | ICD-10-CM | POA: Diagnosis not present

## 2015-11-26 DIAGNOSIS — X32XXXA Exposure to sunlight, initial encounter: Secondary | ICD-10-CM | POA: Diagnosis not present

## 2015-11-29 ENCOUNTER — Other Ambulatory Visit (INDEPENDENT_AMBULATORY_CARE_PROVIDER_SITE_OTHER): Payer: Medicare Other

## 2015-11-29 DIAGNOSIS — E785 Hyperlipidemia, unspecified: Secondary | ICD-10-CM

## 2015-11-29 DIAGNOSIS — E1121 Type 2 diabetes mellitus with diabetic nephropathy: Secondary | ICD-10-CM | POA: Diagnosis not present

## 2015-11-29 DIAGNOSIS — I10 Essential (primary) hypertension: Secondary | ICD-10-CM | POA: Diagnosis not present

## 2015-11-29 DIAGNOSIS — E559 Vitamin D deficiency, unspecified: Secondary | ICD-10-CM | POA: Diagnosis not present

## 2015-11-29 DIAGNOSIS — D509 Iron deficiency anemia, unspecified: Secondary | ICD-10-CM | POA: Diagnosis not present

## 2015-11-29 LAB — COMPREHENSIVE METABOLIC PANEL
ALT: 22 U/L (ref 0–35)
AST: 21 U/L (ref 0–37)
Albumin: 4.4 g/dL (ref 3.5–5.2)
Alkaline Phosphatase: 42 U/L (ref 39–117)
BUN: 36 mg/dL — ABNORMAL HIGH (ref 6–23)
CALCIUM: 10.2 mg/dL (ref 8.4–10.5)
CHLORIDE: 103 meq/L (ref 96–112)
CO2: 29 meq/L (ref 19–32)
CREATININE: 1.15 mg/dL (ref 0.40–1.20)
GFR: 47.04 mL/min — ABNORMAL LOW (ref >60.00)
Glucose, Bld: 146 mg/dL — ABNORMAL HIGH (ref 70–99)
POTASSIUM: 4.4 meq/L (ref 3.5–5.1)
Sodium: 140 mEq/L (ref 135–145)
Total Bilirubin: 0.4 mg/dL (ref 0.2–1.2)
Total Protein: 7.3 g/dL (ref 6.0–8.3)

## 2015-11-29 LAB — CBC WITH DIFFERENTIAL/PLATELET
BASOS ABS: 0 10*3/uL (ref 0.0–0.1)
Basophils Relative: 0.6 % (ref 0.0–3.0)
EOS ABS: 0.3 10*3/uL (ref 0.0–0.7)
Eosinophils Relative: 4.8 % (ref 0.0–5.0)
HCT: 31.9 % — ABNORMAL LOW (ref 36.0–46.0)
Hemoglobin: 10.9 g/dL — ABNORMAL LOW (ref 12.0–15.0)
LYMPHS ABS: 1.7 10*3/uL (ref 0.7–4.0)
LYMPHS PCT: 27.8 % (ref 12.0–46.0)
MCHC: 34.1 g/dL (ref 30.0–36.0)
MCV: 84.9 fl (ref 78.0–100.0)
Monocytes Absolute: 0.5 10*3/uL (ref 0.1–1.0)
Monocytes Relative: 8 % (ref 3.0–12.0)
NEUTROS ABS: 3.7 10*3/uL (ref 1.4–7.7)
NEUTROS PCT: 58.8 % (ref 43.0–77.0)
PLATELETS: 289 10*3/uL (ref 150.0–400.0)
RBC: 3.76 Mil/uL — AB (ref 3.87–5.11)
RDW: 13.4 % (ref 11.5–15.5)
WBC: 6.3 10*3/uL (ref 4.0–10.5)

## 2015-11-29 LAB — LIPID PANEL
CHOLESTEROL: 150 mg/dL (ref 0–200)
HDL: 56.1 mg/dL (ref >39.00)
LDL Cholesterol: 74 mg/dL (ref 0–99)
NONHDL: 94.3
TRIGLYCERIDES: 102 mg/dL (ref 0.0–149.0)
Total CHOL/HDL Ratio: 3
VLDL: 20.4 mg/dL (ref 0.0–40.0)

## 2015-11-29 LAB — IRON AND TIBC
%SAT: 25 % (ref 11–50)
Iron: 86 ug/dL (ref 45–160)
TIBC: 343 ug/dL (ref 250–450)
UIBC: 257 ug/dL (ref 125–400)

## 2015-11-29 LAB — HEMOGLOBIN A1C: Hgb A1c MFr Bld: 7 % — ABNORMAL HIGH (ref 4.6–6.5)

## 2015-11-29 LAB — MICROALBUMIN / CREATININE URINE RATIO
CREATININE, U: 78.1 mg/dL
MICROALB UR: 10.1 mg/dL — AB (ref 0.0–1.9)
MICROALB/CREAT RATIO: 12.9 mg/g (ref 0.0–30.0)

## 2015-11-29 LAB — LDL CHOLESTEROL, DIRECT: LDL DIRECT: 76 mg/dL

## 2015-11-29 LAB — VITAMIN D 25 HYDROXY (VIT D DEFICIENCY, FRACTURES): VITD: 49.52 ng/mL (ref 30.00–100.00)

## 2015-11-29 LAB — FERRITIN: Ferritin: 104.2 ng/mL (ref 10.0–291.0)

## 2015-11-30 MED ORDER — METOPROLOL SUCCINATE ER 50 MG PO TB24: 50.0000 mg | ORAL_TABLET | Freq: Every day | ORAL | 0 refills | Status: DC

## 2015-11-30 MED ORDER — VALSARTAN 320 MG PO TABS: 320.0000 mg | ORAL_TABLET | Freq: Every day | ORAL | 0 refills | Status: DC

## 2015-11-30 NOTE — Addendum Note (Signed)
Addended by: [name] on: 11/30/2015 08:16 AM   Modules accepted: Orders

## 2015-12-27 ENCOUNTER — Other Ambulatory Visit: Payer: Self-pay | Admitting: Internal Medicine

## 2016-01-25 DIAGNOSIS — Z23 Encounter for immunization: Secondary | ICD-10-CM | POA: Diagnosis not present

## 2016-01-28 ENCOUNTER — Other Ambulatory Visit: Payer: Self-pay | Admitting: Internal Medicine

## 2016-02-10 ENCOUNTER — Other Ambulatory Visit: Payer: Self-pay | Admitting: Internal Medicine

## 2016-02-28 ENCOUNTER — Ambulatory Visit (INDEPENDENT_AMBULATORY_CARE_PROVIDER_SITE_OTHER): Payer: Medicare Other | Admitting: Cardiovascular Disease

## 2016-02-28 ENCOUNTER — Encounter: Payer: Self-pay | Admitting: Cardiovascular Disease

## 2016-02-28 VITALS — BP 168/82 | HR 76 | Ht 62.0 in | Wt 125.0 lb

## 2016-02-28 DIAGNOSIS — I10 Essential (primary) hypertension: Secondary | ICD-10-CM

## 2016-02-28 DIAGNOSIS — I350 Nonrheumatic aortic (valve) stenosis: Secondary | ICD-10-CM | POA: Diagnosis not present

## 2016-02-28 NOTE — Patient Instructions (Addendum)
Medication Instructions:  Your physician recommends that you continue on your current medications as directed. Please refer to the Current Medication list given to you today.   Labwork: none  Testing/Procedures: Your physician has requested that you have an echocardiogram in one year. Echocardiography is a painless test that uses sound waves to create images of your heart. It provides your doctor with information about the size and shape of your heart and how well your heart's chambers and valves are working. This procedure takes approximately one hour. There are no restrictions for this procedure.    Follow-Up: Your physician wants you to follow-up in: one year with Dr. Fletcher Anon.  You will receive a reminder letter in the mail two months in advance. If you don't receive a letter, please call our office to schedule the follow-up appointment.   Any Other Special Instructions Will Be Listed Below (If Applicable).     If you need a refill on your cardiac medications before your next appointment, please call your pharmacy.  Echocardiogram An echocardiogram, or echocardiography, uses sound waves (ultrasound) to produce an image of your heart. The echocardiogram is simple, painless, obtained within a short period of time, and offers valuable information to your health care provider. The images from an echocardiogram can provide information such as:  Evidence of coronary artery disease (CAD).  Heart size.  Heart muscle function.  Heart valve function.  Aneurysm detection.  Evidence of a past heart attack.  Fluid buildup around the heart.  Heart muscle thickening.  Assess heart valve function. LET [facility] CARE PROVIDER KNOW ABOUT:  Any allergies you have.  All medicines you are taking, including vitamins, herbs, eye drops, creams, and over-the-counter medicines.  Previous problems you or members of your family have had with the use of anesthetics.  Any blood disorders you  have.  Previous surgeries you have had.  Medical conditions you have.  Possibility of pregnancy, if this applies. BEFORE THE PROCEDURE  No special preparation is needed. Eat and drink normally.  PROCEDURE   In order to produce an image of your heart, gel will be applied to your chest and a wand-like tool (transducer) will be moved over your chest. The gel will help transmit the sound waves from the transducer. The sound waves will harmlessly bounce off your heart to allow the heart images to be captured in real-time motion. These images will then be recorded.  You may need an IV to receive a medicine that improves the quality of the pictures. AFTER THE PROCEDURE You may return to your normal schedule including diet, activities, and medicines, unless your health care provider tells you otherwise.   This information is not intended to replace advice given to you by your health care provider. Make sure you discuss any questions you have with your health care provider.   Document Released: 04/14/2000 Document Revised: 05/08/2014 Document Reviewed: 12/23/2012 Elsevier Interactive Patient Education [employer].

## 2016-02-28 NOTE — Progress Notes (Signed)
Cardiology Office Note   Date:  02/28/2016   ID:  Maureen Morales, DOB 25-Dec-1922, MRN [id number]  PCP:  [name], MD  Cardiologist:   [name], MD   Chief Complaint  Patient presents with  . other    37yr f/u. Medications reviewed with patient verbally. Doing well.      History of Present Illness: Maureen Morales is a 82 y.o. female who presents for a follow up regarding aortic stenosis.  She has been relatively healthy throughout her life but does have hypertension, hyperlipidemia and type 2 diabetes.  Most recent echocardiogram in February 2017 showed normal LV systolic function with mild aortic stenosis. Mean gradient was 16 mmHg with a valve area 1.52. She has been doing well and denies any chest pain since breath.   Past Medical History:  Diagnosis Date  . Diabetes mellitus   . Hyperlipidemia   . Hypertension   . Viral gastroenteritis due to [place]-like agent Nov 2012    Past Surgical History:  Procedure Laterality Date  . ABDOMINAL HYSTERECTOMY    . BLADDER SURGERY    . CARDIAC CATHETERIZATION    . CHOLECYSTECTOMY       Current Outpatient Prescriptions  Medication Sig Dispense Refill  . BIOTIN 5000 PO Take by mouth.      . Calcium Carb-Cholecalciferol (CALCIUM 600 + D PO) Take by mouth daily.    . calcium carbonate (TUMS) 500 MG chewable tablet Chew 1 tablet by mouth. Taken when needed    . cholecalciferol (VITAMIN D) 1000 UNITS tablet Take 1,000 Units by mouth daily.      . fenofibrate 160 MG tablet TAKE 1 TABLET EVERY DAY FOR HIGH TRIGLYCERIDES 30 tablet 2  . glucose blood (BAYER CONTOUR TEST) test strip Use as instructed 100 each 5  . ibuprofen (ADVIL,MOTRIN) 200 MG tablet Take 200 mg by mouth as needed.      . loratadine (CLARITIN) 10 MG tablet Take 10 mg by mouth daily.    . metFORMIN (GLUCOPHAGE-XR) 750 MG 24 hr tablet TAKE 1 TABLET BY MOUTH DAILY WITH BREAKFAST 30 tablet 4  . metoprolol succinate (TOPROL-XL) 50 MG 24 hr tablet TAKE 1  TABLET BY MOUTH DAILY 30 tablet 4  . Multiple Vitamins-Minerals (ABC PLUS SENIOR PO) Take by mouth.      . Omega-3 Fatty Acids (FISH OIL) 1200 MG CAPS Take by mouth.      . Probiotic Product (PHILLIPS COLON HEALTH PO) Take 1 tablet by mouth daily.      . valsartan (DIOVAN) 320 MG tablet TAKE 1 TABLET BY MOUTH DAILY 30 tablet 4   No current facility-administered medications for this visit.     Allergies:   Aspirin; Atropine; Belladonna alkaloids; Codeine; Darvon; Methocarbamol; Penicillins; and Iron    Social History:  The patient  reports that she has never smoked. She has never used smokeless tobacco. She reports that she does not drink alcohol or use drugs.   Family History:  The patient's family history includes Breast cancer (age of onset: 87) in her sister; Breast cancer (age of onset: 79) in her mother; Cancer in her mother.    ROS:  Please see the history of present illness.   Otherwise, review of systems are positive for none.   All other systems are reviewed and negative.    PHYSICAL EXAM: VS:  BP (!) 168/82 (BP Location: Left Arm, Patient Position: Sitting, Cuff Size: Normal)   Pulse 76   Ht 5'  2" (1.575 m)   Wt 125 lb (56.7 kg)   BMI 22.86 kg/m  , BMI Body mass index is 22.86 kg/m. GEN: Well nourished, well developed, in no acute distress  HEENT: normal  Neck: no JVD, carotid bruits, or masses Cardiac: RRR; no rubs, or gallops,no edema . 3/6 crescendo decrescendo systolic murmur in the aortic area which is mid peaking Respiratory:  clear to auscultation bilaterally, normal work of breathing GI: soft, nontender, nondistended, + BS MS: no deformity or atrophy  Skin: warm and dry, no rash Neuro:  Strength and sensation are intact Psych: euthymic mood, full affect   EKG:  EKG is ordered today. The ekg ordered today demonstrates normal sinus rhythm with nonspecific ST changes.   Recent Labs: 08/27/2015: TSH 3.76 11/29/2015: ALT 22; BUN 36; Creatinine, Ser 1.15;  Hemoglobin 10.9; Platelets 289.0; Potassium 4.4; Sodium 140    Lipid Panel    Component Value Date/Time   CHOL 150 11/29/2015 0806   TRIG 102.0 11/29/2015 0806   HDL 56.10 11/29/2015 0806   CHOLHDL 3 11/29/2015 0806   VLDL 20.4 11/29/2015 0806   LDLCALC 74 11/29/2015 0806   LDLDIRECT 76.0 11/29/2015 0806      Wt Readings from Last 3 Encounters:  02/28/16 125 lb (56.7 kg)  05/19/15 127 lb 8 oz (57.8 kg)  02/18/15 125 lb (56.7 kg)       No flowsheet data found.    ASSESSMENT AND PLAN:  1.  Mild aortic valve stenosis: She is asymptomatic. Repeat echocardiogram in one year from now.  2. Essential hypertension: Blood pressure is mildly elevated today but she has been stressed about her husband's hospitalization.    Disposition:   FU with me in 1 year  Signed,  [name], MD  02/28/2016 2:18 PM    [place]

## 2016-03-09 ENCOUNTER — Other Ambulatory Visit: Payer: Medicare Other

## 2016-04-21 ENCOUNTER — Ambulatory Visit: Payer: Medicare Other

## 2016-04-24 ENCOUNTER — Other Ambulatory Visit: Payer: Self-pay | Admitting: Internal Medicine

## 2016-04-24 DIAGNOSIS — E1121 Type 2 diabetes mellitus with diabetic nephropathy: Secondary | ICD-10-CM

## 2016-04-24 DIAGNOSIS — E538 Deficiency of other specified B group vitamins: Secondary | ICD-10-CM

## 2016-04-24 DIAGNOSIS — D508 Other iron deficiency anemias: Secondary | ICD-10-CM

## 2016-04-24 DIAGNOSIS — E7800 Pure hypercholesterolemia, unspecified: Secondary | ICD-10-CM

## 2016-04-24 DIAGNOSIS — R42 Dizziness and giddiness: Secondary | ICD-10-CM

## 2016-05-01 DIAGNOSIS — I219 Acute myocardial infarction, unspecified: Secondary | ICD-10-CM

## 2016-05-01 HISTORY — DX: Acute myocardial infarction, unspecified: I21.9

## 2016-05-04 ENCOUNTER — Other Ambulatory Visit: Payer: Self-pay | Admitting: Internal Medicine

## 2016-06-05 ENCOUNTER — Telehealth: Payer: Self-pay | Admitting: Internal Medicine

## 2016-06-05 NOTE — Telephone Encounter (Signed)
I called pt and left a message to schedule AWV. Thank you1

## 2016-06-26 ENCOUNTER — Other Ambulatory Visit: Payer: Self-pay | Admitting: Internal Medicine

## 2016-06-26 ENCOUNTER — Encounter: Payer: Self-pay | Admitting: Cardiovascular Disease

## 2016-06-27 ENCOUNTER — Other Ambulatory Visit: Payer: Self-pay | Admitting: Internal Medicine

## 2016-07-02 ENCOUNTER — Telehealth: Payer: Self-pay | Admitting: Internal Medicine

## 2016-07-02 NOTE — Telephone Encounter (Signed)
Can you call ms Ew[place] and arrange a lab visit asap? Has not had labs since July and wants them .  Fasting labs ordered,  As long as her appt is 3  Hours after her last food or drink,  We can call it fasting.  Can also schedule an office visit using an 11:30 or a 4:30 slot in the next 2 seeks (after the labs have been resulted).    [employer]

## 2016-07-03 NOTE — Telephone Encounter (Signed)
Yes that's fine. Make sure the seamstress appt is not until after 1;30?

## 2016-07-03 NOTE — Telephone Encounter (Signed)
Patient stated the earliest she could come for lab is 07/06/16 @ 11:00 am and OV with you patient could not make until 07/10/16 at 11.30, patient stated also not to forget the 8th and the seamstress appointment? Are these dates ok?

## 2016-07-03 NOTE — Telephone Encounter (Signed)
Patient stated the appointment would be around 2.30 and would you pick her up.

## 2016-07-06 ENCOUNTER — Other Ambulatory Visit (INDEPENDENT_AMBULATORY_CARE_PROVIDER_SITE_OTHER): Payer: Medicare Other

## 2016-07-06 DIAGNOSIS — D508 Other iron deficiency anemias: Secondary | ICD-10-CM | POA: Diagnosis not present

## 2016-07-06 DIAGNOSIS — E1121 Type 2 diabetes mellitus with diabetic nephropathy: Secondary | ICD-10-CM | POA: Diagnosis not present

## 2016-07-06 DIAGNOSIS — E7800 Pure hypercholesterolemia, unspecified: Secondary | ICD-10-CM | POA: Diagnosis not present

## 2016-07-06 DIAGNOSIS — E538 Deficiency of other specified B group vitamins: Secondary | ICD-10-CM | POA: Diagnosis not present

## 2016-07-06 LAB — CBC WITH DIFFERENTIAL/PLATELET
BASOS ABS: 0.1 10*3/uL (ref 0.0–0.1)
Basophils Relative: 0.7 % (ref 0.0–3.0)
EOS ABS: 0.2 10*3/uL (ref 0.0–0.7)
Eosinophils Relative: 3 % (ref 0.0–5.0)
HEMATOCRIT: 33 % — AB (ref 36.0–46.0)
Hemoglobin: 11.1 g/dL — ABNORMAL LOW (ref 12.0–15.0)
LYMPHS PCT: 26.4 % (ref 12.0–46.0)
Lymphs Abs: 2.1 10*3/uL (ref 0.7–4.0)
MCHC: 33.6 g/dL (ref 30.0–36.0)
MCV: 85.7 fl (ref 78.0–100.0)
Monocytes Absolute: 0.6 10*3/uL (ref 0.1–1.0)
Monocytes Relative: 8 % (ref 3.0–12.0)
NEUTROS ABS: 4.9 10*3/uL (ref 1.4–7.7)
Neutrophils Relative %: 61.9 % (ref 43.0–77.0)
PLATELETS: 297 10*3/uL (ref 150.0–400.0)
RBC: 3.85 Mil/uL — ABNORMAL LOW (ref 3.87–5.11)
RDW: 13.9 % (ref 11.5–15.5)
WBC: 7.8 10*3/uL (ref 4.0–10.5)

## 2016-07-06 LAB — COMPREHENSIVE METABOLIC PANEL
ALK PHOS: 66 U/L (ref 39–117)
ALT: 23 U/L (ref 0–35)
AST: 21 U/L (ref 0–37)
Albumin: 4.4 g/dL (ref 3.5–5.2)
BUN: 39 mg/dL — ABNORMAL HIGH (ref 6–23)
CHLORIDE: 102 meq/L (ref 96–112)
CO2: 28 meq/L (ref 19–32)
Calcium: 10.5 mg/dL (ref 8.4–10.5)
Creatinine, Ser: 1.07 mg/dL (ref 0.40–1.20)
GFR: 51.05 mL/min — AB (ref >60.00)
GLUCOSE: 173 mg/dL — AB (ref 70–99)
POTASSIUM: 4.4 meq/L (ref 3.5–5.1)
Sodium: 138 mEq/L (ref 135–145)
Total Bilirubin: 0.3 mg/dL (ref 0.2–1.2)
Total Protein: 7.2 g/dL (ref 6.0–8.3)

## 2016-07-06 LAB — LDL CHOLESTEROL, DIRECT: Direct LDL: 92 mg/dL

## 2016-07-06 LAB — MICROALBUMIN / CREATININE URINE RATIO
CREATININE, U: 42.9 mg/dL
MICROALB UR: 30.9 mg/dL — AB (ref 0.0–1.9)
Microalb Creat Ratio: 72 mg/g — ABNORMAL HIGH (ref 0.0–30.0)

## 2016-07-06 LAB — HEMOGLOBIN A1C: Hgb A1c MFr Bld: 8.3 % — ABNORMAL HIGH (ref 4.6–6.5)

## 2016-07-06 LAB — VITAMIN B12: Vitamin B-12: 788 pg/mL (ref 211–911)

## 2016-07-10 ENCOUNTER — Encounter: Payer: Self-pay | Admitting: Internal Medicine

## 2016-07-10 ENCOUNTER — Ambulatory Visit (INDEPENDENT_AMBULATORY_CARE_PROVIDER_SITE_OTHER): Payer: Medicare Other | Admitting: Internal Medicine

## 2016-07-10 ENCOUNTER — Other Ambulatory Visit: Payer: Self-pay | Admitting: Internal Medicine

## 2016-07-10 DIAGNOSIS — I10 Essential (primary) hypertension: Secondary | ICD-10-CM | POA: Diagnosis not present

## 2016-07-10 DIAGNOSIS — I350 Nonrheumatic aortic (valve) stenosis: Secondary | ICD-10-CM

## 2016-07-10 DIAGNOSIS — E1121 Type 2 diabetes mellitus with diabetic nephropathy: Secondary | ICD-10-CM

## 2016-07-10 DIAGNOSIS — E7800 Pure hypercholesterolemia, unspecified: Secondary | ICD-10-CM | POA: Diagnosis not present

## 2016-07-10 MED ORDER — BD MICROTAINER LANCETS MISC: 1 refills | Status: DC

## 2016-07-10 NOTE — Progress Notes (Signed)
Pre visit review using our clinic review tool, if applicable. No additional management support is needed unless otherwise documented below in the visit note. 

## 2016-07-10 NOTE — Assessment & Plan Note (Signed)
Uncontrolled by current A1c.  Given her age,  Will avoid adding medication until home cbgs can be checked.  Will consider adding [place].

## 2016-07-10 NOTE — Assessment & Plan Note (Signed)
LDL is at goal on fenofibrate .  she has no side effects and liver enzymes are normal. No changes today  Lab Results  Component Value Date   LDLCALC 74 11/29/2015

## 2016-07-10 NOTE — Progress Notes (Signed)
Subjective:  Patient ID: Maureen Morales, female    DOB: Dec 19, 1922  Age: 83 y.o. MRN: [id number]  CC: Diagnoses of Type II diabetes mellitus with nephropathy ([place]), Aortic stenosis, mild, Essential hypertension, and Pure hypercholesterolemia were pertinent to this visit.  HPI [name] presents for folow up on type 2  DM with  Hypertension   Taking metformin 750 mg daily ,  does not check sugars.  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  Reviewed diet. Her diet is mediterranean and very low fat but not low carb.  [place] 95[place] of meals at [employer].   Outpatient Medications Prior to Visit  Medication Sig Dispense Refill  . Calcium Carb-Cholecalciferol (CALCIUM 600 + D PO) Take by mouth daily.    . calcium carbonate (TUMS) 500 MG chewable tablet Chew 1 tablet by mouth. Taken when needed    . fenofibrate 160 MG tablet TAKE 1 TABLET BY MOUTH DAILY FOR HIGH TRIGLYCERIDES 30 tablet 3  . glucose blood (BAYER CONTOUR TEST) test strip Use as instructed 100 each 5  . ibuprofen (ADVIL,MOTRIN) 200 MG tablet Take 200 mg by mouth as needed.      . loratadine (CLARITIN) 10 MG tablet Take 10 mg by mouth daily.    . metFORMIN (GLUCOPHAGE-XR) 750 MG 24 hr tablet TAKE 1 TABLET BY MOUTH DAILY WITH BREAKFAST 30 tablet 3  . metoprolol succinate (TOPROL-XL) 50 MG 24 hr tablet TAKE 1 TABLET BY MOUTH DAILY 30 tablet 3  . Multiple Vitamins-Minerals (ABC PLUS SENIOR PO) Take by mouth.      . Omega-3 Fatty Acids (FISH OIL) 1200 MG CAPS Take by mouth.      . Probiotic Product ([place] COLON HEALTH PO) Take 1 tablet by mouth daily.      . valsartan (DIOVAN) 320 MG tablet TAKE 1 TABLET BY MOUTH DAILY 30 tablet 3  . BIOTIN 5000 PO Take by mouth.      . cholecalciferol (VITAMIN D) 1000 UNITS tablet Take 1,000 Units by mouth daily.       No facility-administered medications prior to visit.     Review of Systems;  Patient denies headache, fevers, malaise, unintentional weight loss, skin rash, eye  pain, sinus congestion and sinus pain, sore throat, dysphagia,  hemoptysis , cough, dyspnea, wheezing, chest pain, palpitations, orthopnea, edema, abdominal pain, nausea, melena, diarrhea, constipation, flank pain, dysuria, hematuria, urinary  Frequency, nocturia, numbness, tingling, seizures,  Focal weakness, Loss of consciousness,  Tremor, insomnia, depression, anxiety, and suicidal ideation.      Objective:  BP (!) 142/68 (BP Location: Left Arm, Patient Position: Sitting, Cuff Size: Normal)   Pulse 70   Resp 14   Ht 5\' 2"  (1.575 m)   Wt 124 lb 3.2 oz (56.3 kg)   SpO2 98%   BMI 22.72 kg/m   BP Readings from Last 3 Encounters:  07/10/16 (!) 142/68  02/28/16 (!) 168/82  05/19/15 140/70    Wt Readings from Last 3 Encounters:  07/10/16 124 lb 3.2 oz (56.3 kg)  02/28/16 125 lb (56.7 kg)  05/19/15 127 lb 8 oz (57.8 kg)    General appearance: alert, cooperative and appears stated age Ears: normal TM's and external ear canals both ears Throat: lips, mucosa, and tongue normal; teeth and gums normal Neck: no adenopathy, no carotid bruit, supple, symmetrical, trachea midline and thyroid not enlarged, symmetric, no tenderness/mass/nodules Back: symmetric, no curvature. ROM normal. No CVA tenderness. Lungs: clear to auscultation bilaterally Heart: regular rate and  rhythm, S1, S2 normal, murmur of aortic stenosis , no click, rub or gallop Abdomen: soft, non-tender; bowel sounds normal; no masses,  no organomegaly Pulses: 2+ and symmetric Skin: Skin color, texture, turgor normal. No rashes or lesions Lymph nodes: Cervical, supraclavicular, and axillary nodes normal.  Lab Results  Component Value Date   HGBA1C 8.3 (H) 07/06/2016   HGBA1C 7.0 (H) 11/29/2015   HGBA1C 7.8 (H) 08/27/2015    Lab Results  Component Value Date   CREATININE 1.07 07/06/2016   CREATININE 1.15 11/29/2015   CREATININE 1.18 08/27/2015    Lab Results  Component Value Date   WBC 7.8 07/06/2016   HGB 11.1  (L) 07/06/2016   HCT 33.0 (L) 07/06/2016   PLT 297.0 07/06/2016   GLUCOSE 173 (H) 07/06/2016   CHOL 150 11/29/2015   TRIG 102.0 11/29/2015   HDL 56.10 11/29/2015   LDLDIRECT 92.0 07/06/2016   LDLCALC 74 11/29/2015   ALT 23 07/06/2016   AST 21 07/06/2016   NA 138 07/06/2016   K 4.4 07/06/2016   CL 102 07/06/2016   CREATININE 1.07 07/06/2016   BUN 39 (H) 07/06/2016   CO2 28 07/06/2016   TSH 3.76 08/27/2015   INR 1.2 (H) 12/15/2013   HGBA1C 8.3 (H) 07/06/2016   MICROALBUR 30.9 (H) 07/06/2016    Mm Screening Breast Tomo Bilateral  Result Date: 10/06/2015 CLINICAL DATA:  Screening. EXAM: 2D DIGITAL SCREENING BILATERAL MAMMOGRAM WITH CAD AND ADJUNCT TOMO COMPARISON:  Previous exam(s). ACR Breast Density Category b: There are scattered areas of fibroglandular density. FINDINGS: There are no findings suspicious for malignancy. Images were processed with CAD. IMPRESSION: No mammographic evidence of malignancy. A result letter of this screening mammogram will be mailed directly to the patient. RECOMMENDATION: Screening mammogram in one year. (Code:SM-B-01Y) BI-RADS CATEGORY  1: Negative. Electronically Signed   By: Fidela Salisbury M.D.   On: 10/06/2015 11:59    Assessment & Plan:   Problem List Items Addressed This Visit    Aortic stenosis, mild    Mild, asymptomatic.  continue annual screening       Hyperlipidemia    LDL is at goal on fenofibrate .  she has no side effects and liver enzymes are normal. No changes today  Lab Results  Component Value Date   LDLCALC 74 11/29/2015          Hypertension    Reasonable control for age on current regimen. Renal function stable, no changes today.  Lab Results  Component Value Date   CREATININE 1.07 07/06/2016   Lab Results  Component Value Date   NA 138 07/06/2016   K 4.4 07/06/2016   CL 102 07/06/2016   CO2 28 07/06/2016         Type II diabetes mellitus with nephropathy ([place])    Uncontrolled by current A1c.  Given  her age,  Will avoid adding medication until home cbgs can be checked.  Will consider adding [place].          I have discontinued Ms. Andrus cholecalciferol and BIOTIN 5000 PO. I am also having her maintain her Fish Oil, Multiple Vitamins-Minerals (ABC PLUS SENIOR PO), calcium carbonate, ibuprofen, Probiotic Product ([place]), glucose blood, Calcium Carb-Cholecalciferol (CALCIUM 600 + D PO), loratadine, metoprolol succinate, metFORMIN, valsartan, and fenofibrate.  Meds ordered this encounter  Medications  . DISCONTD: BD MICROTAINER LANCETS MISC    Sig: Contact  Activated lancet 1.8 mm x 21 g Use once daily to check blood sugars  Dispense:  100 each    Refill:  1    Medications Discontinued During This Encounter  Medication Reason  . BIOTIN 5000 PO Patient has not taken in last 30 days  . cholecalciferol (VITAMIN D) 1000 UNITS tablet Patient has not taken in last 30 days    Follow-up: No Follow-up on file.   [name], MD

## 2016-07-10 NOTE — Assessment & Plan Note (Signed)
Reasonable control for age on current regimen. Renal function stable, no changes today.  Lab Results  Component Value Date   CREATININE 1.07 07/06/2016   Lab Results  Component Value Date   NA 138 07/06/2016   K 4.4 07/06/2016   CL 102 07/06/2016   CO2 28 07/06/2016

## 2016-07-10 NOTE — Assessment & Plan Note (Signed)
Mild, asymptomatic.  continue annual screening

## 2016-07-21 ENCOUNTER — Other Ambulatory Visit: Payer: Medicare Other

## 2016-07-28 ENCOUNTER — Other Ambulatory Visit: Payer: Self-pay | Admitting: Internal Medicine

## 2016-07-28 ENCOUNTER — Ambulatory Visit (INDEPENDENT_AMBULATORY_CARE_PROVIDER_SITE_OTHER): Payer: Medicare Other

## 2016-07-28 ENCOUNTER — Other Ambulatory Visit: Payer: Self-pay

## 2016-07-28 DIAGNOSIS — I350 Nonrheumatic aortic (valve) stenosis: Secondary | ICD-10-CM

## 2016-08-07 ENCOUNTER — Telehealth: Payer: Self-pay | Admitting: Cardiovascular Disease

## 2016-08-07 NOTE — Telephone Encounter (Signed)
Pt daughter calling about echo results Please call back

## 2016-08-07 NOTE — Telephone Encounter (Signed)
Pt had 3/30 echo as requested by her PCP, Dr. Derrel Nip. Dr. Rockey Situ, echo reader.  Routed to Dr. Fletcher Anon, primary cardiologist, to make aware.  Left message on daughter's VM to contact the office.

## 2016-08-08 NOTE — Telephone Encounter (Signed)
Echo reviewed by Dr. Fletcher Anon. Echocardiogram stable; no interventions needed. Reviewed results w/pt's daughter, [name].

## 2016-08-08 NOTE — Telephone Encounter (Signed)
Pt daughter calling us back Please call her back

## 2016-08-28 ENCOUNTER — Other Ambulatory Visit: Payer: Self-pay | Admitting: Internal Medicine

## 2016-08-28 MED ORDER — MUPIROCIN 2 % EX OINT: 1.0000 "application " | TOPICAL_OINTMENT | Freq: Two times a day (BID) | CUTANEOUS | 0 refills | Status: DC

## 2016-08-30 ENCOUNTER — Inpatient Hospital Stay
Admission: EM | Admit: 2016-08-30 | Discharge: 2016-09-02 | DRG: 282 | Disposition: A | Discharge status: Home Health | Payer: Medicare Other | Attending: Internal Medicine | Admitting: Internal Medicine

## 2016-08-30 ENCOUNTER — Encounter: Payer: Self-pay | Admitting: Internal Medicine

## 2016-08-30 ENCOUNTER — Other Ambulatory Visit: Payer: Self-pay | Admitting: Internal Medicine

## 2016-08-30 ENCOUNTER — Ambulatory Visit (INDEPENDENT_AMBULATORY_CARE_PROVIDER_SITE_OTHER): Payer: Medicare Other

## 2016-08-30 ENCOUNTER — Ambulatory Visit (INDEPENDENT_AMBULATORY_CARE_PROVIDER_SITE_OTHER): Payer: Medicare Other | Admitting: Internal Medicine

## 2016-08-30 VITALS — BP 180/84 | HR 78 | Temp 97.8°F | Resp 12 | Wt 124.5 lb

## 2016-08-30 DIAGNOSIS — I208 Other forms of angina pectoris: Secondary | ICD-10-CM | POA: Diagnosis not present

## 2016-08-30 DIAGNOSIS — R7989 Other specified abnormal findings of blood chemistry: Secondary | ICD-10-CM | POA: Diagnosis not present

## 2016-08-30 DIAGNOSIS — I160 Hypertensive urgency: Secondary | ICD-10-CM

## 2016-08-30 DIAGNOSIS — E119 Type 2 diabetes mellitus without complications: Secondary | ICD-10-CM | POA: Diagnosis not present

## 2016-08-30 DIAGNOSIS — Z7984 Long term (current) use of oral hypoglycemic drugs: Secondary | ICD-10-CM | POA: Diagnosis not present

## 2016-08-30 DIAGNOSIS — I2510 Atherosclerotic heart disease of native coronary artery without angina pectoris: Secondary | ICD-10-CM | POA: Diagnosis not present

## 2016-08-30 DIAGNOSIS — Z885 Allergy status to narcotic agent status: Secondary | ICD-10-CM

## 2016-08-30 DIAGNOSIS — I340 Nonrheumatic mitral (valve) insufficiency: Secondary | ICD-10-CM | POA: Diagnosis not present

## 2016-08-30 DIAGNOSIS — I350 Nonrheumatic aortic (valve) stenosis: Secondary | ICD-10-CM | POA: Diagnosis not present

## 2016-08-30 DIAGNOSIS — I060 Rheumatic aortic stenosis: Secondary | ICD-10-CM

## 2016-08-30 DIAGNOSIS — Z880 Allergy status to penicillin: Secondary | ICD-10-CM

## 2016-08-30 DIAGNOSIS — E1121 Type 2 diabetes mellitus with diabetic nephropathy: Secondary | ICD-10-CM | POA: Diagnosis present

## 2016-08-30 DIAGNOSIS — I214 Non-ST elevation (NSTEMI) myocardial infarction: Secondary | ICD-10-CM

## 2016-08-30 DIAGNOSIS — R079 Chest pain, unspecified: Secondary | ICD-10-CM

## 2016-08-30 DIAGNOSIS — I10 Essential (primary) hypertension: Secondary | ICD-10-CM | POA: Diagnosis present

## 2016-08-30 DIAGNOSIS — Z886 Allergy status to analgesic agent status: Secondary | ICD-10-CM | POA: Diagnosis not present

## 2016-08-30 DIAGNOSIS — K219 Gastro-esophageal reflux disease without esophagitis: Secondary | ICD-10-CM | POA: Diagnosis present

## 2016-08-30 DIAGNOSIS — Z882 Allergy status to sulfonamides status: Secondary | ICD-10-CM | POA: Diagnosis not present

## 2016-08-30 DIAGNOSIS — Z888 Allergy status to other drugs, medicaments and biological substances status: Secondary | ICD-10-CM | POA: Diagnosis not present

## 2016-08-30 DIAGNOSIS — R0989 Other specified symptoms and signs involving the circulatory and respiratory systems: Secondary | ICD-10-CM

## 2016-08-30 DIAGNOSIS — I25118 Atherosclerotic heart disease of native coronary artery with other forms of angina pectoris: Secondary | ICD-10-CM | POA: Diagnosis not present

## 2016-08-30 DIAGNOSIS — R778 Other specified abnormalities of plasma proteins: Secondary | ICD-10-CM

## 2016-08-30 DIAGNOSIS — Z79899 Other long term (current) drug therapy: Secondary | ICD-10-CM | POA: Diagnosis not present

## 2016-08-30 DIAGNOSIS — E785 Hyperlipidemia, unspecified: Secondary | ICD-10-CM | POA: Diagnosis present

## 2016-08-30 DIAGNOSIS — E1169 Type 2 diabetes mellitus with other specified complication: Secondary | ICD-10-CM | POA: Diagnosis present

## 2016-08-30 DIAGNOSIS — R0789 Other chest pain: Secondary | ICD-10-CM

## 2016-08-30 DIAGNOSIS — I21A1 Myocardial infarction type 2: Secondary | ICD-10-CM | POA: Diagnosis present

## 2016-08-30 DIAGNOSIS — I6529 Occlusion and stenosis of unspecified carotid artery: Secondary | ICD-10-CM | POA: Diagnosis not present

## 2016-08-30 DIAGNOSIS — E7800 Pure hypercholesterolemia, unspecified: Secondary | ICD-10-CM | POA: Diagnosis not present

## 2016-08-30 DIAGNOSIS — I361 Nonrheumatic tricuspid (valve) insufficiency: Secondary | ICD-10-CM | POA: Diagnosis not present

## 2016-08-30 HISTORY — DX: Non-ST elevation (NSTEMI) myocardial infarction: I21.4

## 2016-08-30 LAB — TROPONIN I
TROPONIN I: 5.79 ng/mL — AB (ref <=0.05)
Troponin I: 3.21 ng/mL (ref <0.03)

## 2016-08-30 LAB — BASIC METABOLIC PANEL
ANION GAP: 7 (ref 5–15)
BUN: 31 mg/dL — AB (ref 4–21)
BUN: 36 mg/dL — ABNORMAL HIGH (ref 6–20)
CALCIUM: 10 mg/dL (ref 8.9–10.3)
CHLORIDE: 102 mmol/L (ref 101–111)
CO2: 28 mmol/L (ref 22–32)
Creatinine, Ser: 1.23 mg/dL — ABNORMAL HIGH (ref 0.44–1.00)
Creatinine: 1.2 mg/dL — AB (ref 0.5–1.1)
GFR calc Af Amer: 43 mL/min — ABNORMAL LOW (ref >60)
GFR calc non Af Amer: 37 mL/min — ABNORMAL LOW (ref >60)
GLUCOSE: 200 mg/dL — AB (ref 65–99)
Glucose: 139 mg/dL
POTASSIUM: 3.7 mmol/L (ref 3.5–5.1)
Sodium: 137 mmol/L (ref 135–145)
Sodium: 137 mmol/L (ref 137–147)

## 2016-08-30 LAB — COMPREHENSIVE METABOLIC PANEL
ALBUMIN: 4.1 g/dL (ref 3.6–5.1)
ALT: 25 U/L (ref 6–29)
AST: 29 U/L (ref 10–35)
Alkaline Phosphatase: 46 U/L (ref 33–130)
BUN: 31 mg/dL — ABNORMAL HIGH (ref 7–25)
CHLORIDE: 100 mmol/L (ref 98–110)
CO2: 29 mmol/L (ref 20–31)
Calcium: 9.9 mg/dL (ref 8.6–10.4)
Creat: 1.19 mg/dL — ABNORMAL HIGH (ref 0.60–0.88)
GLUCOSE: 139 mg/dL — AB (ref 65–99)
Potassium: 4.1 mmol/L (ref 3.5–5.3)
SODIUM: 137 mmol/L (ref 135–146)
Total Bilirubin: 0.4 mg/dL (ref 0.2–1.2)
Total Protein: 6.4 g/dL (ref 6.1–8.1)

## 2016-08-30 LAB — CBC WITH DIFFERENTIAL/PLATELET
BASOS ABS: 0 10*3/uL (ref 0–0.1)
BASOS PCT: 1 %
Basophils Absolute: 65 cells/uL (ref 0–200)
Basophils Relative: 1 %
EOS ABS: 195 {cells}/uL (ref 15–500)
Eosinophils Absolute: 0.3 10*3/uL (ref 0–0.7)
Eosinophils Relative: 3 %
Eosinophils Relative: 4 %
HEMATOCRIT: 31.3 % — AB (ref 35.0–45.0)
HEMATOCRIT: 33.6 % — AB (ref 35.0–47.0)
HEMOGLOBIN: 11.7 g/dL — AB (ref 12.0–16.0)
Hemoglobin: 10.4 g/dL — ABNORMAL LOW (ref 11.7–15.5)
LYMPHS PCT: 27 %
LYMPHS PCT: 27 %
Lymphs Abs: 1755 cells/uL (ref 850–3900)
Lymphs Abs: 2.2 10*3/uL (ref 1.0–3.6)
MCH: 28.4 pg (ref 27.0–33.0)
MCH: 29.7 pg (ref 26.0–34.0)
MCHC: 33.2 g/dL (ref 32.0–36.0)
MCHC: 34.8 g/dL (ref 32.0–36.0)
MCV: 85.5 fL (ref 80.0–100.0)
MCV: 85.2 fL (ref 80.0–100.0)
MONO ABS: 520 {cells}/uL (ref 200–950)
MONO ABS: 0.7 10*3/uL (ref 0.2–0.9)
MPV: 9.6 fL (ref 7.5–12.5)
Monocytes Relative: 8 %
Monocytes Relative: 8 %
NEUTROS ABS: 4.9 10*3/uL (ref 1.4–6.5)
Neutro Abs: 3965 cells/uL (ref 1500–7800)
Neutrophils Relative %: 61 %
Neutrophils Relative %: 60 %
Platelets: 249 10*3/uL (ref 140–400)
Platelets: 261 10*3/uL (ref 150–440)
RBC: 3.66 MIL/uL — ABNORMAL LOW (ref 3.80–5.10)
RBC: 3.94 MIL/uL (ref 3.80–5.20)
RDW: 13.9 % (ref 11.0–15.0)
RDW: 13.6 % (ref 11.5–14.5)
WBC: 6.5 10*3/uL (ref 3.8–10.8)
WBC: 8.1 10*3/uL (ref 3.6–11.0)

## 2016-08-30 LAB — CK TOTAL AND CKMB (NOT AT ARMC)
CK, MB: 16.8 ng/mL — AB (ref 0.0–5.0)
RELATIVE INDEX: 8.8 — AB (ref 0.0–4.0)
Total CK: 192 U/L — ABNORMAL HIGH (ref 7–177)

## 2016-08-30 LAB — BRAIN NATRIURETIC PEPTIDE: B NATRIURETIC PEPTIDE 5: 203 pg/mL — AB (ref 0.0–100.0)

## 2016-08-30 LAB — PROTIME-INR
INR: 1.13
Prothrombin Time: 14.6 seconds (ref 11.4–15.2)

## 2016-08-30 LAB — HEPATIC FUNCTION PANEL: Bilirubin, Total: 0.4 mg/dL

## 2016-08-30 LAB — APTT: aPTT: 32 seconds (ref 24–36)

## 2016-08-30 LAB — CBC AND DIFFERENTIAL
NEUTROS ABS: 3965 /uL
WBC: 6.5 10^3/mL

## 2016-08-30 MED ORDER — LABETALOL HCL 5 MG/ML IV SOLN
10.0000 mg | INTRAVENOUS | Status: DC | PRN
  Administered 2016-08-31: 10 mg via INTRAVENOUS
  Filled 2016-08-30: qty 4

## 2016-08-30 MED ORDER — AMLODIPINE BESYLATE 5 MG PO TABS
2.5000 mg | ORAL_TABLET | Freq: Every day | ORAL | Status: DC
  Administered 2016-08-31: 2.5 mg via ORAL
  Filled 2016-08-30: qty 1

## 2016-08-30 MED ORDER — ACETAMINOPHEN 325 MG PO TABS: 650.0000 mg | ORAL_TABLET | Freq: Four times a day (QID) | ORAL | Status: DC | PRN

## 2016-08-30 MED ORDER — OXYCODONE HCL 5 MG PO TABS: 5.0000 mg | ORAL_TABLET | ORAL | Status: DC | PRN

## 2016-08-30 MED ORDER — NITROGLYCERIN 0.4 MG SL SUBL
0.4000 mg | SUBLINGUAL_TABLET | SUBLINGUAL | Status: DC | PRN
  Administered 2016-08-30: 0.4 mg via SUBLINGUAL
  Filled 2016-08-30 (×2): qty 1

## 2016-08-30 MED ORDER — ONDANSETRON HCL 4 MG PO TABS
4.0000 mg | ORAL_TABLET | Freq: Four times a day (QID) | ORAL | Status: DC | PRN
  Filled 2016-08-30: qty 1

## 2016-08-30 MED ORDER — ACETAMINOPHEN 650 MG RE SUPP: 650.0000 mg | Freq: Four times a day (QID) | RECTAL | Status: DC | PRN

## 2016-08-30 MED ORDER — PANTOPRAZOLE SODIUM 40 MG PO TBEC
40.0000 mg | DELAYED_RELEASE_TABLET | Freq: Every day | ORAL | Status: DC
  Administered 2016-08-31 – 2016-09-02 (×3): 40 mg via ORAL
  Filled 2016-08-30 (×3): qty 1

## 2016-08-30 MED ORDER — ACETAMINOPHEN 500 MG PO TABS
1000.0000 mg | ORAL_TABLET | Freq: Once | ORAL | Status: AC
  Administered 2016-08-30: 1000 mg via ORAL
  Filled 2016-08-30: qty 2

## 2016-08-30 MED ORDER — PANTOPRAZOLE SODIUM 40 MG PO TBEC: 40.0000 mg | DELAYED_RELEASE_TABLET | Freq: Every day | ORAL | 0 refills | Status: DC

## 2016-08-30 MED ORDER — ONDANSETRON HCL 4 MG/2ML IJ SOLN: 4.0000 mg | Freq: Four times a day (QID) | INTRAMUSCULAR | Status: DC | PRN

## 2016-08-30 MED ORDER — ONDANSETRON HCL 4 MG/2ML IJ SOLN
4.0000 mg | Freq: Once | INTRAMUSCULAR | Status: AC
  Administered 2016-08-30: 4 mg via INTRAVENOUS

## 2016-08-30 MED ORDER — IRBESARTAN 150 MG PO TABS
300.0000 mg | ORAL_TABLET | Freq: Every day | ORAL | Status: DC
  Administered 2016-08-31 – 2016-09-02 (×3): 300 mg via ORAL
  Filled 2016-08-30 (×3): qty 2

## 2016-08-30 MED ORDER — HEPARIN BOLUS VIA INFUSION
3400.0000 [IU] | Freq: Once | INTRAVENOUS | Status: AC
  Administered 2016-08-30: 3400 [IU] via INTRAVENOUS
  Filled 2016-08-30: qty 3400

## 2016-08-30 MED ORDER — METOPROLOL SUCCINATE ER 50 MG PO TB24
50.0000 mg | ORAL_TABLET | Freq: Every day | ORAL | Status: DC
  Administered 2016-08-31: 50 mg via ORAL
  Filled 2016-08-30: qty 1

## 2016-08-30 MED ORDER — FENTANYL CITRATE (PF) 100 MCG/2ML IJ SOLN
25.0000 ug | INTRAMUSCULAR | Status: DC | PRN
  Administered 2016-08-30: 25 ug via INTRAVENOUS
  Filled 2016-08-30: qty 2

## 2016-08-30 MED ORDER — INSULIN ASPART 100 UNIT/ML ~~LOC~~ SOLN
0.0000 [IU] | Freq: Four times a day (QID) | SUBCUTANEOUS | Status: DC
  Administered 2016-08-31: 2 [IU] via SUBCUTANEOUS
  Administered 2016-08-31: 1 [IU] via SUBCUTANEOUS
  Administered 2016-08-31: 3 [IU] via SUBCUTANEOUS
  Administered 2016-09-01: 1 [IU] via SUBCUTANEOUS
  Administered 2016-09-01: 5 [IU] via SUBCUTANEOUS
  Administered 2016-09-01: 1 [IU] via SUBCUTANEOUS
  Administered 2016-09-02 (×2): 2 [IU] via SUBCUTANEOUS
  Filled 2016-08-30: qty 2
  Filled 2016-08-30: qty 1
  Filled 2016-08-30: qty 5
  Filled 2016-08-30: qty 3
  Filled 2016-08-30 (×2): qty 2
  Filled 2016-08-30 (×2): qty 1

## 2016-08-30 MED ORDER — ONDANSETRON HCL 4 MG/2ML IJ SOLN
INTRAMUSCULAR | Status: AC
  Administered 2016-08-30: 4 mg via INTRAVENOUS
  Filled 2016-08-30: qty 2

## 2016-08-30 MED ORDER — AMLODIPINE BESYLATE 2.5 MG PO TABS: 2.5000 mg | ORAL_TABLET | Freq: Every day | ORAL | 3 refills | Status: DC

## 2016-08-30 MED ORDER — HEPARIN (PORCINE) IN NACL 100-0.45 UNIT/ML-% IJ SOLN
850.0000 [IU]/h | INTRAMUSCULAR | Status: DC
  Administered 2016-08-30: 650 [IU]/h via INTRAVENOUS
  Filled 2016-08-30: qty 250

## 2016-08-30 NOTE — Assessment & Plan Note (Signed)
Elevated  On valsartan and metoprolol.  Adding amlodipine 2.5 mg

## 2016-08-30 NOTE — ED Provider Notes (Signed)
[facility] Emergency Department Provider Note    First MD Initiated Contact with Patient 08/30/16 2051     (approximate)  I have reviewed the triage vital signs and the nursing notes.   HISTORY  Chief Complaint Chest Pain    HPI Maureen Morales is a 83 y.o. female presents with chief complaint of mid sternal and left upper shoulder chest pain that started at 5 AM this morning. States the pain has been across her chest all day. States it will come in waves. No nausea or vomiting. No diaphoresis. Pain before. Went to see her primary care doctor today and had routine lab work including a troponin drawn which was elevated. She was then called by her provider to come to the ER.Patient states that she thinks most of her symptoms were from indigestion. Denies any pain when taking a deep breath. No recent fevers. Currently rates the pain as mild and 4 out of 10.   Past Medical History:  Diagnosis Date  . Diabetes mellitus   . Hyperlipidemia   . Hypertension   . Viral gastroenteritis due to [place]-like agent Nov 2012   Family History  Problem Relation Age of Onset  . Cancer Mother   . Breast cancer Mother 80  . Breast cancer Sister 1   Past Surgical History:  Procedure Laterality Date  . ABDOMINAL HYSTERECTOMY    . BLADDER SURGERY    . CARDIAC CATHETERIZATION    . CHOLECYSTECTOMY     Patient Active Problem List   Diagnosis Date Noted  . NSTEMI (non-ST elevated myocardial infarction) ([place]) 08/30/2016  . GERD (gastroesophageal reflux disease) 08/30/2016  . Dysplastic nevus of lower extremity 02/20/2015  . UTI (urinary tract infection) 02/19/2015  . Pain in joint, ankle and foot 06/24/2014  . Atypical chest pain 02/17/2014  . S/P laparoscopic cholecystectomy 12/15/2013  . Preoperative general physical examination 12/15/2013  . Encounter for preventive health examination 11/13/2013  . Cervical spine disease 01/27/2013  . Anemia, iron deficiency  01/21/2013  . Right medial knee pain 01/09/2013  . Back pain, thoracic 08/27/2012  . Feeling of chest tightness 08/07/2012  . Aortic stenosis, mild 05/06/2012  . Asthma 07/04/2011  . Hemorrhoids, internal 04/05/2011  . Nummular eczema 01/16/2011  . Accelerated hypertension   . Type II diabetes mellitus with nephropathy ([place])   . Hyperlipidemia       Prior to Admission medications   Medication Sig Start Date End Date Taking? Authorizing Provider  amLODipine (NORVASC) 2.5 MG tablet Take 1 tablet (2.5 mg total) by mouth daily. 08/30/16  Yes [name], MD  BD MICROTAINER LANCETS MISC Contact  Activated lancet 1.8 mm x 21 g Use once daily to check blood sugars 07/10/16  Yes [name], MD  Calcium Carb-Cholecalciferol (CALCIUM 600 + D PO) Take by mouth daily.   Yes Historical Provider, MD  calcium carbonate (TUMS) 500 MG chewable tablet Chew 1 tablet by mouth. Taken when needed   Yes Historical Provider, MD  fenofibrate 160 MG tablet TAKE 1 TABLET BY MOUTH DAILY FOR HIGH TRIGLYCERIDES 06/28/16  Yes [name], MD  glucose blood (BAYER CONTOUR TEST) test strip Use as instructed 02/28/13  Yes [name], MD  ibuprofen (ADVIL,MOTRIN) 200 MG tablet Take 200 mg by mouth as needed.     Yes Historical Provider, MD  loratadine (CLARITIN) 10 MG tablet Take 10 mg by mouth daily.   Yes Historical Provider, MD  metFORMIN (GLUCOPHAGE-XR) 750 MG 24  hr tablet TAKE 1 TABLET BY MOUTH DAILY WITH BREAKFAST 06/28/16  Yes [name], MD  metoprolol succinate (TOPROL-XL) 50 MG 24 hr tablet TAKE 1 TABLET BY MOUTH DAILY 06/28/16  Yes [name], MD  Multiple Vitamins-Minerals (ABC PLUS SENIOR PO) Take by mouth.     Yes Historical Provider, MD  mupirocin ointment (BACTROBAN) 2 % Apply 1 application topically 2 (two) times daily. To left earlobe 08/28/16  Yes [name], MD  Omega-3 Fatty Acids (FISH OIL) 1200 MG CAPS Take by mouth.     Yes Historical Provider, MD  pantoprazole (PROTONIX) 40 MG  tablet Take 1 tablet (40 mg total) by mouth daily. 08/30/16  Yes [name], MD  Probiotic Product ([place] PO) Take 1 tablet by mouth daily.     Yes Historical Provider, MD  valsartan (DIOVAN) 320 MG tablet TAKE 1 TABLET BY MOUTH DAILY 06/28/16  Yes [name], MD    Allergies Aspirin; Atropine; Belladonna alkaloids; Codeine; Darvon; Methocarbamol; Penicillins; Sulfa antibiotics; and Iron    Social History Social History  Substance Use Topics  . Smoking status: Never Smoker  . Smokeless tobacco: Never Used  . Alcohol use No    Review of Systems Patient denies headaches, rhinorrhea, blurry vision, numbness, shortness of breath, chest pain, edema, cough, abdominal pain, nausea, vomiting, diarrhea, dysuria, fevers, rashes or hallucinations unless otherwise stated above in HPI. ____________________________________________   PHYSICAL EXAM:  VITAL SIGNS: Vitals:   08/30/16 2200 08/30/16 2251  BP:  (!) 142/59  Pulse:  70  Resp:  14  Temp: 98.5 F (36.9 C)     Constitutional: Alert and oriented. Well appearing and in no acute distress. Eyes: Conjunctivae are normal. PERRL. EOMI. Head: Atraumatic. Nose: No congestion/rhinnorhea. Mouth/Throat: Mucous membranes are moist.  Oropharynx non-erythematous. Neck: No stridor. Painless ROM. No cervical spine tenderness to palpation Hematological/Lymphatic/Immunilogical: No cervical lymphadenopathy. Cardiovascular: Normal rate, regular rhythm. Grossly normal heart sounds.  Good peripheral circulation. Respiratory: Normal respiratory effort.  No retractions. Lungs CTAB. Gastrointestinal: Soft and nontender. No distention. No abdominal bruits. No CVA tenderness. Genitourinary:  Musculoskeletal: No lower extremity tenderness nor edema.  No joint effusions. Neurologic:  Normal speech and language. No gross focal neurologic deficits are appreciated. No gait instability. Skin:  Skin is warm, dry and intact. No rash  noted. Psychiatric: Mood and affect are normal. Speech and behavior are normal.  ____________________________________________   LABS (all labs ordered are listed, but only abnormal results are displayed)  Results for orders placed or performed during the hospital encounter of 08/30/16 (from the past 24 hour(s))  CBC with Differential/Platelet     Status: Abnormal   Collection Time: 08/30/16  9:01 PM  Result Value Ref Range   WBC 8.1 3.6 - 11.0 K/uL   RBC 3.94 3.80 - 5.20 MIL/uL   Hemoglobin 11.7 (L) 12.0 - 16.0 g/dL   HCT 33.6 (L) 35.0 - 47.0 %   MCV 85.2 80.0 - 100.0 fL   MCH 29.7 26.0 - 34.0 pg   MCHC 34.8 32.0 - 36.0 g/dL   RDW 13.6 11.5 - 14.5 %   Platelets 261 150 - 440 K/uL   Neutrophils Relative % 60 %   Neutro Abs 4.9 1.4 - 6.5 K/uL   Lymphocytes Relative 27 %   Lymphs Abs 2.2 1.0 - 3.6 K/uL   Monocytes Relative 8 %   Monocytes Absolute 0.7 0.2 - 0.9 K/uL   Eosinophils Relative 4 %   Eosinophils Absolute  0.3 0 - 0.7 K/uL   Basophils Relative 1 %   Basophils Absolute 0.0 0 - 0.1 K/uL  Basic metabolic panel     Status: Abnormal   Collection Time: 08/30/16  9:01 PM  Result Value Ref Range   Sodium 137 135 - 145 mmol/L   Potassium 3.7 3.5 - 5.1 mmol/L   Chloride 102 101 - 111 mmol/L   CO2 28 22 - 32 mmol/L   Glucose, Bld 200 (H) 65 - 99 mg/dL   BUN 36 (H) 6 - 20 mg/dL   Creatinine, Ser 1.23 (H) 0.44 - 1.00 mg/dL   Calcium 10.0 8.9 - 10.3 mg/dL   GFR calc non Af Amer 37 (L) >60 mL/min   GFR calc Af Amer 43 (L) >60 mL/min   Anion gap 7 5 - 15  Troponin I     Status: Abnormal   Collection Time: 08/30/16  9:01 PM  Result Value Ref Range   Troponin I 3.21 (HH) <0.03 ng/mL  Protime-INR     Status: None   Collection Time: 08/30/16  9:01 PM  Result Value Ref Range   Prothrombin Time 14.6 11.4 - 15.2 seconds   INR 1.13   APTT     Status: None   Collection Time: 08/30/16  9:01 PM  Result Value Ref Range   aPTT 32 24 - 36 seconds    ____________________________________________  EKG My review and personal interpretation at Time: 20:48   Indication: chest pain  Rate: 80  Rhythm: sinus Axis: normal Other: non specific st changes, no st elevation criteria, normal interbals ____________________________________________  RADIOLOGY  CXR reviewed from clinic without mediastinal wideneing ____________________________________________   PROCEDURES  Procedure(s) performed:  Procedures    Critical Care performed: yes CRITICAL CARE Performed by: Merlyn Lot   Total critical care time: 40 minutes  Critical care time was exclusive of separately billable procedures and treating other patients.  Critical care was necessary to treat or prevent imminent or life-threatening deterioration.  Critical care was time spent personally by me on the following activities: development of treatment plan with patient and/or surrogate as well as nursing, discussions with consultants, evaluation of patient's response to treatment, examination of patient, obtaining history from patient or surrogate, ordering and performing treatments and interventions, ordering and review of laboratory studies, ordering and review of radiographic studies, pulse oximetry and re-evaluation of patient's condition.  ____________________________________________   INITIAL IMPRESSION / ASSESSMENT AND PLAN / ED COURSE  Pertinent labs & imaging results that were available during my care of the patient were reviewed by me and considered in my medical decision making (see chart for details).  DDX: ACS, pericarditis, esophagitis, boerhaaves, pe, dissection, pna, bronchitis, costochondritis   MAGDA MUISE is a 83 y.o. who presents to the ED with chief complaint of chest pain as described above.  Patient is AFVSS in ED. Exam as above. Given current presentation have considered the above differential.  Patient hypertensive and doesn't chest pain. EKG shows  no evidence of acute ST elevations. Presentation not consistent with dissection. No evidence of acute infectious process. Patient is with troponin level down from her level this morning concerning for an STEMI with primary event that occurred earlier today.  Patient will be admitted for further evaluation and management.  Have discussed with the patient and available family all diagnostics and treatments performed thus far and all questions were answered to the best of my ability. The patient demonstrates understanding and agreement with plan.  ____________________________________________   FINAL CLINICAL IMPRESSION(S) / ED DIAGNOSES  Final diagnoses:  Chest pain, unspecified type  Elevated troponin I level  Hypertensive urgency      NEW MEDICATIONS STARTED DURING THIS VISIT:  New Prescriptions   No medications on file     Note:  This document was prepared using Dragon voice recognition software and may include unintentional dictation errors.    Merlyn Lot, MD 08/30/16 [phone]

## 2016-08-30 NOTE — H&P (Signed)
[facility] [place] NAME: Maureen Morales    MR#:  [id number]  DATE OF BIRTH:  July 12, 1922  DATE OF ADMISSION:  08/30/2016  PRIMARY CARE PHYSICIAN: [name], MD   REQUESTING/REFERRING PHYSICIAN: Quentin Cornwall, MD  CHIEF COMPLAINT:   Chief Complaint  Patient presents with  . Chest Pain    HISTORY OF PRESENT ILLNESS:  Maureen Morales  is a 83 y.o. female who presents with Pain that started early this morning. She states that she woke up from her sleep in the early morning hours to use the bathroom and had significant chest pain. This pain persisted, though improved some throughout the day. She went to see her doctor ordered basic workup including cardiac enzyme. This later resulted positive and the patient was called to come to the ED for further evaluation. Here her troponin has dropped to 3 on recheck from the initial value of 5. Heparin drip was ordered, and hospitalists were called for admission.  PAST MEDICAL HISTORY:   Past Medical History:  Diagnosis Date  . Diabetes mellitus   . Hyperlipidemia   . Hypertension   . Viral gastroenteritis due to [place]-like agent Nov 2012    PAST SURGICAL HISTORY:   Past Surgical History:  Procedure Laterality Date  . ABDOMINAL HYSTERECTOMY    . BLADDER SURGERY    . CARDIAC CATHETERIZATION    . CHOLECYSTECTOMY      SOCIAL HISTORY:   Social History  Substance Use Topics  . Smoking status: Never Smoker  . Smokeless tobacco: Never Used  . Alcohol use No    FAMILY HISTORY:   Family History  Problem Relation Age of Onset  . Cancer Mother   . Breast cancer Mother 37  . Breast cancer Sister 71    DRUG ALLERGIES:   Allergies  Allergen Reactions  . Aspirin   . Atropine   . Belladonna Alkaloids   . Codeine   . Darvon   . Methocarbamol   . Penicillins   . Sulfa Antibiotics   . Iron Other (See Comments)    High dose prescription gives her diarrhea    MEDICATIONS AT HOME:    Prior to Admission medications   Medication Sig Start Date End Date Taking? Authorizing Provider  amLODipine (NORVASC) 2.5 MG tablet Take 1 tablet (2.5 mg total) by mouth daily. 08/30/16   [name], MD  BD MICROTAINER LANCETS MISC Contact  Activated lancet 1.8 mm x 21 g Use once daily to check blood sugars 07/10/16   [name], MD  Calcium Carb-Cholecalciferol (CALCIUM 600 + D PO) Take by mouth daily.    Historical Provider, MD  calcium carbonate (TUMS) 500 MG chewable tablet Chew 1 tablet by mouth. Taken when needed    Historical Provider, MD  fenofibrate 160 MG tablet TAKE 1 TABLET BY MOUTH DAILY FOR HIGH TRIGLYCERIDES 06/28/16   [name], MD  glucose blood (BAYER CONTOUR TEST) test strip Use as instructed 02/28/13   [name], MD  ibuprofen (ADVIL,MOTRIN) 200 MG tablet Take 200 mg by mouth as needed.      Historical Provider, MD  loratadine (CLARITIN) 10 MG tablet Take 10 mg by mouth daily.    Historical Provider, MD  metFORMIN (GLUCOPHAGE-XR) 750 MG 24 hr tablet TAKE 1 TABLET BY MOUTH DAILY WITH BREAKFAST 06/28/16   [name], MD  metoprolol succinate (TOPROL-XL) 50 MG 24 hr tablet TAKE 1 TABLET BY MOUTH DAILY 06/28/16  [name], MD  Multiple Vitamins-Minerals (ABC PLUS SENIOR PO) Take by mouth.      Historical Provider, MD  mupirocin ointment (BACTROBAN) 2 % Apply 1 application topically 2 (two) times daily. To left earlobe 08/28/16   [name], MD  Omega-3 Fatty Acids (FISH OIL) 1200 MG CAPS Take by mouth.      Historical Provider, MD  pantoprazole (PROTONIX) 40 MG tablet Take 1 tablet (40 mg total) by mouth daily. 08/30/16   [name], MD  Probiotic Product ([place] PO) Take 1 tablet by mouth daily.      Historical Provider, MD  valsartan (DIOVAN) 320 MG tablet TAKE 1 TABLET BY MOUTH DAILY 06/28/16   [name], MD    REVIEW OF SYSTEMS:  Review of Systems  Constitutional: Negative for chills, fever, malaise/fatigue and weight loss.   HENT: Negative for ear pain, hearing loss and tinnitus.   Eyes: Negative for blurred vision, double vision, pain and redness.  Respiratory: Positive for shortness of breath. Negative for cough and hemoptysis.   Cardiovascular: Positive for chest pain. Negative for palpitations, orthopnea and leg swelling.  Gastrointestinal: Negative for abdominal pain, constipation, diarrhea, nausea and vomiting.  Genitourinary: Negative for dysuria, frequency and hematuria.  Musculoskeletal: Negative for back pain, joint pain and neck pain.  Skin:       No acne, rash, or lesions  Neurological: Negative for dizziness, tremors, focal weakness and weakness.  Endo/Heme/Allergies: Negative for polydipsia. Does not bruise/bleed easily.  Psychiatric/Behavioral: Negative for depression. The patient is not nervous/anxious and does not have insomnia.      VITAL SIGNS:   Vitals:   08/30/16 2100 08/30/16 2200  BP: (!) 229/71   Pulse: 82   Resp: (!) 21   Temp:  98.5 F (36.9 C)  TempSrc:  Oral  SpO2: 98%   Weight:  56.2 kg (124 lb)   Wt Readings from Last 3 Encounters:  08/30/16 56.2 kg (124 lb)  08/30/16 56.5 kg (124 lb 8 oz)  07/10/16 56.3 kg (124 lb 3.2 oz)    PHYSICAL EXAMINATION:  Physical Exam  Vitals reviewed. Constitutional: She is oriented to person, place, and time. She appears well-developed and well-nourished. No distress.  HENT:  Head: Normocephalic and atraumatic.  Mouth/Throat: Oropharynx is clear and moist.  Eyes: Conjunctivae and EOM are normal. Pupils are equal, round, and reactive to light. No scleral icterus.  Neck: Normal range of motion. Neck supple. No JVD present. No thyromegaly present.  Cardiovascular: Normal rate, regular rhythm and intact distal pulses.  Exam reveals no gallop and no friction rub.   Murmur (Systolic) heard. Respiratory: Effort normal and breath sounds normal. No respiratory distress. She has no wheezes. She has no rales.  GI: Soft. Bowel sounds are  normal. She exhibits no distension. There is no tenderness.  Musculoskeletal: Normal range of motion. She exhibits no edema.  No arthritis, no gout  Lymphadenopathy:    She has no cervical adenopathy.  Neurological: She is alert and oriented to person, place, and time. No cranial nerve deficit.  No dysarthria, no aphasia  Skin: Skin is warm and dry. No rash noted. No erythema.  Psychiatric: She has a normal mood and affect. Her behavior is normal. Judgment and thought content normal.    LABORATORY PANEL:   CBC  Recent Labs Lab 08/30/16 2101  WBC 8.1  HGB 11.7*  HCT 33.6*  PLT 261   ------------------------------------------------------------------------------------------------------------------  Chemistries   Recent Labs Lab 08/30/16  1410 08/30/16 2101  NA 137 137  K 4.1 3.7  CL 100 102  CO2 29 28  GLUCOSE 139* 200*  BUN 31* 36*  CREATININE 1.19* 1.23*  CALCIUM 9.9 10.0  AST 29  --   ALT 25  --   ALKPHOS 46  --   BILITOT 0.4  --    ------------------------------------------------------------------------------------------------------------------  Cardiac Enzymes  Recent Labs Lab 08/30/16 2101  TROPONINI 3.21*   ------------------------------------------------------------------------------------------------------------------  RADIOLOGY:  Dg Chest 2 View  Result Date: 08/30/2016 CLINICAL DATA:  Chest pain EXAM: CHEST  2 VIEW COMPARISON:  08/27/2012 FINDINGS: Cardiac shadow is within normal limits. The lungs demonstrates some mild interstitial change without focal confluent infiltrate. No sizable effusion is seen. No bony abnormality is noted. IMPRESSION: Mild interstitial changes which are likely chronic in etiology. No focal confluent infiltrate is seen. Electronically Signed   By: Inez Catalina M.D.   On: 08/30/2016 14:23    EKG:   Orders placed or performed during the hospital encounter of 08/30/16  . EKG 12-Lead  . EKG 12-Lead    IMPRESSION AND PLAN:   Principal Problem:   NSTEMI (non-ST elevated myocardial infarction) (HCC) - heparin drip started, we will trend her cardiac enzymes, get an echocardiogram and a cardiology consult. Active Problems:   Accelerated hypertension - blood pressure was significantly elevated, it has come down some with Nitropaste, we will use further when necessary medications to control her blood pressure   Type II diabetes mellitus with nephropathy (HCC) - sliding scale insulin with corresponding glucose checks   Hyperlipidemia - continue home meds   GERD (gastroesophageal reflux disease) - continue home meds  All the records are reviewed and case discussed with ED provider. Management plans discussed with the patient and/or family.  DVT PROPHYLAXIS: Systemic anticoagulation  GI PROPHYLAXIS: PPI  ADMISSION STATUS: Inpatient  CODE STATUS: Full Code Status History    This patient does not have a recorded code status. Please follow your organizational policy for patients in this situation.    Advance Directive Documentation     Most Recent Value  Type of Advance Directive  Living will  Pre-existing out of facility DNR order (yellow form or pink MOST form)  -  "MOST" Form in Place?  -      TOTAL TIME TAKING CARE OF THIS PATIENT: 45 minutes.   Maureen Morales 08/30/2016, 10:34 PM  [facility]  Office  [phone]  CC: Primary care physician; [name], MD  Note:  This document was prepared using Dragon voice recognition software and may include unintentional dictation errors.

## 2016-08-30 NOTE — Progress Notes (Unsigned)
Lab reports elevated troponin of 5.79.  Patient contacted and agreeable for transport to hospital.  [name]

## 2016-08-30 NOTE — Patient Instructions (Addendum)
Your EKG does NOT suggest that you are having a heart attack.  The labs will not be back for several hours,    I think your pain is from gastritis and reflux   Take a dose of Mylanta Gas or Gaviscon this afternoon   Start amlodipine  Tonight  after dinner (for blood pressure)  Start the protonix (pantoprazole ) tomorrow if you are still having tummy pain

## 2016-08-30 NOTE — ED Triage Notes (Signed)
Reports having chest pain across chest all day.  Called by MD office with positive Troponin result.

## 2016-08-30 NOTE — Progress Notes (Signed)
Subjective:  Patient ID: Maureen Morales, female    DOB: 09-03-1922  Age: 83 y.o. MRN: [id number]  CC: The primary encounter diagnosis was Other forms of angina pectoris ([place]). Diagnoses of Chest pain, unspecified type, Atypical chest pain, Essential hypertension, and NSTEMI (non-ST elevated myocardial infarction) [facility]) were also pertinent to this visit.  HPI Maureen Morales presents for evaluation of chest pain.  Patient reports waking up this morning at 5 am with 10/10 chest pain at 5 am  Described as dull .  The pain radiated to her left shoulder and was accompanied by excessive burping. .  No diaphroresis , nausea or shortness of breath.   She thought it was indigestion because she ate some very spicy Thai soup last evening .  Currently her pain is 4/10 .  No pleuritic component,  Not aggravated by activity.      No facility-administered medications prior to visit.    Outpatient Medications Prior to Visit  Medication Sig Dispense Refill  . BD MICROTAINER LANCETS MISC Contact  Activated lancet 1.8 mm x 21 g Use once daily to check blood sugars 100 each 1  . Calcium Carb-Cholecalciferol (CALCIUM 600 + D PO) Take by mouth daily.    . calcium carbonate (TUMS) 500 MG chewable tablet Chew 1 tablet by mouth. Taken when needed    . fenofibrate 160 MG tablet TAKE 1 TABLET BY MOUTH DAILY FOR HIGH TRIGLYCERIDES 30 tablet 3  . glucose blood (BAYER CONTOUR TEST) test strip Use as instructed 100 each 5  . ibuprofen (ADVIL,MOTRIN) 200 MG tablet Take 200 mg by mouth as needed.      . loratadine (CLARITIN) 10 MG tablet Take 10 mg by mouth daily.    . metFORMIN (GLUCOPHAGE-XR) 750 MG 24 hr tablet TAKE 1 TABLET BY MOUTH DAILY WITH BREAKFAST 30 tablet 3  . metoprolol succinate (TOPROL-XL) 50 MG 24 hr tablet TAKE 1 TABLET BY MOUTH DAILY 30 tablet 3  . Multiple Vitamins-Minerals (ABC PLUS SENIOR PO) Take by mouth.      . mupirocin ointment (BACTROBAN) 2 % Apply 1 application topically 2 (two) times daily. To  left earlobe 22 g 0  . Omega-3 Fatty Acids (FISH OIL) 1200 MG CAPS Take by mouth.      . Probiotic Product ([place] [employer]ON HEALTH PO) Take 1 tablet by mouth daily.      . valsartan (DIOVAN) 320 MG tablet TAKE 1 TABLET BY MOUTH DAILY 30 tablet 3    Review of Systems;  Patient denies headache, fevers, malaise, unintentional weight loss, skin rash, eye pain, sinus congestion and sinus pain, sore throat, dysphagia,  hemoptysis , cough, dyspnea, wheezing, chest pain, palpitations, orthopnea, edema, abdominal pain, nausea, melena, diarrhea, constipation, flank pain, dysuria, hematuria, urinary  Frequency, nocturia, numbness, tingling, seizures,  Focal weakness, Loss of consciousness,  Tremor, insomnia, depression, anxiety, and suicidal ideation.      Objective:  BP (!) 180/84   Pulse 78   Temp 97.8 F (36.6 C) (Oral)   Resp 12   Wt 124 lb 8 oz (56.5 kg)   SpO2 97%   BMI 22.77 kg/m   BP Readings from Last 3 Encounters:  08/30/16 (!) 229/71  08/30/16 (!) 180/84  07/10/16 (!) 142/68    Wt Readings from Last 3 Encounters:  08/30/16 124 lb 8 oz (56.5 kg)  07/10/16 124 lb 3.2 oz (56.3 kg)  02/28/16 125 lb (56.7 kg)    General appearance: alert, cooperative and appears stated age Ears:  normal TM's and external ear canals both ears Throat: lips, mucosa, and tongue normal; teeth and gums normal Neck: no adenopathy, no carotid bruit, supple, symmetrical, trachea midline and thyroid not enlarged, symmetric, no tenderness/mass/nodules Back: symmetric, no curvature. ROM normal. No CVA tenderness. Lungs: clear to auscultation bilaterally Heart: regular rate and rhythm, S1, S2 normal, no murmur, click, rub or gallop Abdomen: soft, non-tender; bowel sounds normal; no masses,  no organomegaly Pulses: 2+ and symmetric Skin: Skin color, texture, turgor normal. No rashes or lesions Lymph nodes: Cervical, supraclavicular, and axillary nodes normal.  Lab Results  Component Value Date   HGBA1C  8.3 (H) 07/06/2016   HGBA1C 7.0 (H) 11/29/2015   HGBA1C 7.8 (H) 08/27/2015    Lab Results  Component Value Date   CREATININE 1.19 (H) 08/30/2016   CREATININE 1.07 07/06/2016   CREATININE 1.15 11/29/2015    Lab Results  Component Value Date   WBC 8.1 08/30/2016   HGB 11.7 (L) 08/30/2016   HCT 33.6 (L) 08/30/2016   PLT 261 08/30/2016   GLUCOSE 139 (H) 08/30/2016   CHOL 150 11/29/2015   TRIG 102.0 11/29/2015   HDL 56.10 11/29/2015   LDLDIRECT 92.0 07/06/2016   LDLCALC 74 11/29/2015   ALT 25 08/30/2016   AST 29 08/30/2016   NA 137 08/30/2016   K 4.1 08/30/2016   CL 100 08/30/2016   CREATININE 1.19 (H) 08/30/2016   BUN 31 (H) 08/30/2016   CO2 29 08/30/2016   TSH 3.76 08/27/2015   INR 1.2 (H) 12/15/2013   HGBA1C 8.3 (H) 07/06/2016   MICROALBUR 30.9 (H) 07/06/2016    Mm Screening Breast Tomo Bilateral  Result Date: 10/06/2015 CLINICAL DATA:  Screening. EXAM: 2D DIGITAL SCREENING BILATERAL MAMMOGRAM WITH CAD AND ADJUNCT TOMO COMPARISON:  Previous exam(s). ACR Breast Density Category b: There are scattered areas of fibroglandular density. FINDINGS: There are no findings suspicious for malignancy. Images were processed with CAD. IMPRESSION: No mammographic evidence of malignancy. A result letter of this screening mammogram will be mailed directly to the patient. RECOMMENDATION: Screening mammogram in one year. (Code:SM-B-01Y) BI-RADS CATEGORY  1: Negative. Electronically Signed   By: Fidela Salisbury M.D.   On: 10/06/2015 11:59    Assessment & Plan:   Problem List Items Addressed This Visit    Atypical chest pain    Pain is currently improved but not resolved,   Chest  X ray is normal and ekg unchanged from 2016.  Cardiac enzymes ordered STAT.       Hypertension    Elevated  On valsartan and metoprolol.  Adding amlodipine 2.5 mg       Relevant Medications   amLODipine (NORVASC) 2.5 MG tablet   NSTEMI (non-ST elevated myocardial infarction) (HCC)    CARDIAC ENZYMES  POSITIVE. PATIENT was called by on call physician and directed to ER       Relevant Medications   amLODipine (NORVASC) 2.5 MG tablet    Other Visit Diagnoses    Other forms of angina pectoris ([place])    -  Primary   Relevant Medications   amLODipine (NORVASC) 2.5 MG tablet   Other Relevant Orders   EKG 12-Lead (Completed)   Chest pain, unspecified type          I am having Ms. Goodridge start on amLODipine and pantoprazole. I am also having her maintain her Fish Oil, Multiple Vitamins-Minerals (ABC PLUS SENIOR PO), calcium carbonate, ibuprofen, Probiotic Product ([place]), glucose blood, Calcium Carb-Cholecalciferol (CALCIUM 600 + D  PO), loratadine, metoprolol succinate, metFORMIN, valsartan, fenofibrate, BD MICROTAINER LANCETS, and mupirocin ointment.  Meds ordered this encounter  Medications  . amLODipine (NORVASC) 2.5 MG tablet    Sig: Take 1 tablet (2.5 mg total) by mouth daily.    Dispense:  90 tablet    Refill:  3  . pantoprazole (PROTONIX) 40 MG tablet    Sig: Take 1 tablet (40 mg total) by mouth daily.    Dispense:  30 tablet    Refill:  0    There are no discontinued medications.  Follow-up: No Follow-up on file.   [name], MD

## 2016-08-30 NOTE — Assessment & Plan Note (Signed)
Pain is currently improved but not resolved,   Chest  X ray is normal and ekg unchanged from 2016.  Cardiac enzymes ordered STAT.

## 2016-08-30 NOTE — ED Notes (Signed)
Pharm called about heparin being sent

## 2016-08-30 NOTE — ED Notes (Signed)
Date and time results received: 08/30/16  (use smartphrase ".now" to insert current time)  Test: troponin Critical Value: 3.12  Name of Provider Notified: robinson  Orders Received? Or Actions Taken?:

## 2016-08-30 NOTE — Assessment & Plan Note (Signed)
CARDIAC ENZYMES POSITIVE. PATIENT was called by on call physician and directed to ER

## 2016-08-31 ENCOUNTER — Encounter: Payer: Self-pay | Admitting: Emergency Medicine

## 2016-08-31 ENCOUNTER — Inpatient Hospital Stay: Admit: 2016-08-31 | Payer: Medicare Other

## 2016-08-31 ENCOUNTER — Encounter
Admission: EM | Disposition: A | Discharge status: Home Health | Payer: Self-pay | Source: Home / Self Care | Attending: Internal Medicine

## 2016-08-31 ENCOUNTER — Other Ambulatory Visit: Payer: Self-pay

## 2016-08-31 DIAGNOSIS — E1121 Type 2 diabetes mellitus with diabetic nephropathy: Secondary | ICD-10-CM

## 2016-08-31 DIAGNOSIS — I2510 Atherosclerotic heart disease of native coronary artery without angina pectoris: Secondary | ICD-10-CM

## 2016-08-31 DIAGNOSIS — R079 Chest pain, unspecified: Secondary | ICD-10-CM

## 2016-08-31 DIAGNOSIS — I350 Nonrheumatic aortic (valve) stenosis: Secondary | ICD-10-CM

## 2016-08-31 DIAGNOSIS — I10 Essential (primary) hypertension: Secondary | ICD-10-CM

## 2016-08-31 DIAGNOSIS — E7800 Pure hypercholesterolemia, unspecified: Secondary | ICD-10-CM

## 2016-08-31 DIAGNOSIS — I25118 Atherosclerotic heart disease of native coronary artery with other forms of angina pectoris: Secondary | ICD-10-CM

## 2016-08-31 DIAGNOSIS — I214 Non-ST elevation (NSTEMI) myocardial infarction: Principal | ICD-10-CM

## 2016-08-31 HISTORY — PX: LEFT HEART CATH AND CORONARY ANGIOGRAPHY: CATH118249

## 2016-08-31 LAB — CBC
HEMATOCRIT: 30.8 % — AB (ref 35.0–47.0)
HEMOGLOBIN: 10.6 g/dL — AB (ref 12.0–16.0)
MCH: 29.3 pg (ref 26.0–34.0)
MCHC: 34.5 g/dL (ref 32.0–36.0)
MCV: 84.7 fL (ref 80.0–100.0)
Platelets: 229 10*3/uL (ref 150–440)
RBC: 3.63 MIL/uL — ABNORMAL LOW (ref 3.80–5.20)
RDW: 13.9 % (ref 11.5–14.5)
WBC: 6.9 10*3/uL (ref 3.6–11.0)

## 2016-08-31 LAB — GLUCOSE, CAPILLARY
GLUCOSE-CAPILLARY: 146 mg/dL — AB (ref 65–99)
GLUCOSE-CAPILLARY: 243 mg/dL — AB (ref 65–99)
Glucose-Capillary: 123 mg/dL — ABNORMAL HIGH (ref 65–99)
Glucose-Capillary: 175 mg/dL — ABNORMAL HIGH (ref 65–99)

## 2016-08-31 LAB — BASIC METABOLIC PANEL
ANION GAP: 7 (ref 5–15)
BUN: 34 mg/dL — ABNORMAL HIGH (ref 6–20)
CO2: 26 mmol/L (ref 22–32)
Calcium: 9 mg/dL (ref 8.9–10.3)
Chloride: 106 mmol/L (ref 101–111)
Creatinine, Ser: 1 mg/dL (ref 0.44–1.00)
GFR calc Af Amer: 55 mL/min — ABNORMAL LOW (ref >60)
GFR, EST NON AFRICAN AMERICAN: 48 mL/min — AB (ref >60)
Glucose, Bld: 152 mg/dL — ABNORMAL HIGH (ref 65–99)
POTASSIUM: 3.6 mmol/L (ref 3.5–5.1)
SODIUM: 139 mmol/L (ref 135–145)

## 2016-08-31 LAB — TROPONIN I
TROPONIN I: 2.35 ng/mL — AB (ref <0.03)
TROPONIN I: 2.34 ng/mL — AB (ref <0.03)
Troponin I: 3.05 ng/mL (ref <0.03)

## 2016-08-31 LAB — HEPARIN LEVEL (UNFRACTIONATED): Heparin Unfractionated: 0.19 IU/mL — ABNORMAL LOW (ref 0.30–0.70)

## 2016-08-31 SURGERY — LEFT HEART CATH AND CORONARY ANGIOGRAPHY
Anesthesia: Moderate Sedation

## 2016-08-31 MED ORDER — METOPROLOL SUCCINATE ER 50 MG PO TB24: 75.0000 mg | ORAL_TABLET | Freq: Every day | ORAL | Status: DC

## 2016-08-31 MED ORDER — CLOPIDOGREL BISULFATE 75 MG PO TABS
300.0000 mg | ORAL_TABLET | Freq: Once | ORAL | Status: AC
  Administered 2016-08-31: 75 mg via ORAL

## 2016-08-31 MED ORDER — SODIUM CHLORIDE 0.9% FLUSH
3.0000 mL | Freq: Two times a day (BID) | INTRAVENOUS | Status: DC
  Administered 2016-09-01 (×2): 3 mL via INTRAVENOUS

## 2016-08-31 MED ORDER — SODIUM CHLORIDE 0.9 % IV SOLN: 250.0000 mL | INTRAVENOUS | Status: DC | PRN

## 2016-08-31 MED ORDER — SODIUM CHLORIDE 0.9 % IV SOLN
INTRAVENOUS | Status: AC
  Administered 2016-08-31: 01:00:00 via INTRAVENOUS

## 2016-08-31 MED ORDER — CLOPIDOGREL BISULFATE 75 MG PO TABS
ORAL_TABLET | ORAL | Status: AC
  Administered 2016-08-31: 16:00:00
  Filled 2016-08-31: qty 4

## 2016-08-31 MED ORDER — CLOPIDOGREL BISULFATE 75 MG PO TABS
75.0000 mg | ORAL_TABLET | Freq: Every day | ORAL | Status: DC
  Administered 2016-09-01 – 2016-09-02 (×2): 75 mg via ORAL
  Filled 2016-08-31 (×2): qty 1

## 2016-08-31 MED ORDER — LIDOCAINE HCL (PF) 1 % IJ SOLN
INTRAMUSCULAR | Status: AC
Start: 2016-08-31 — End: 2016-08-31
  Filled 2016-08-31: qty 30

## 2016-08-31 MED ORDER — FENTANYL CITRATE (PF) 100 MCG/2ML IJ SOLN
INTRAMUSCULAR | Status: AC
  Filled 2016-08-31: qty 2

## 2016-08-31 MED ORDER — HEPARIN SODIUM (PORCINE) 1000 UNIT/ML IJ SOLN
INTRAMUSCULAR | Status: AC
  Filled 2016-08-31: qty 1

## 2016-08-31 MED ORDER — SODIUM CHLORIDE 0.9% FLUSH: 3.0000 mL | Freq: Two times a day (BID) | INTRAVENOUS | Status: DC

## 2016-08-31 MED ORDER — SODIUM CHLORIDE 0.9% FLUSH: 3.0000 mL | INTRAVENOUS | Status: DC | PRN

## 2016-08-31 MED ORDER — VERAPAMIL HCL 2.5 MG/ML IV SOLN
INTRAVENOUS | Status: AC
  Filled 2016-08-31: qty 2

## 2016-08-31 MED ORDER — MIDAZOLAM HCL 2 MG/2ML IJ SOLN
INTRAMUSCULAR | Status: DC | PRN
  Administered 2016-08-31: 1 mg via INTRAVENOUS

## 2016-08-31 MED ORDER — LIDOCAINE HCL (PF) 1 % IJ SOLN
INTRAMUSCULAR | Status: DC | PRN
  Administered 2016-08-31: 2 mL via SUBCUTANEOUS

## 2016-08-31 MED ORDER — HEPARIN BOLUS VIA INFUSION
1700.0000 [IU] | Freq: Once | INTRAVENOUS | Status: AC
  Administered 2016-08-31: 1700 [IU] via INTRAVENOUS
  Filled 2016-08-31: qty 1700

## 2016-08-31 MED ORDER — IOPAMIDOL (ISOVUE-300) INJECTION 61%
INTRAVENOUS | Status: DC | PRN
  Administered 2016-08-31: 75 mL via INTRA_ARTERIAL

## 2016-08-31 MED ORDER — ATORVASTATIN CALCIUM 20 MG PO TABS
40.0000 mg | ORAL_TABLET | Freq: Every day | ORAL | Status: DC
  Administered 2016-08-31 – 2016-09-01 (×2): 40 mg via ORAL
  Filled 2016-08-31 (×2): qty 2

## 2016-08-31 MED ORDER — SODIUM CHLORIDE 0.9 % WEIGHT BASED INFUSION: 3.0000 mL/kg/h | INTRAVENOUS | Status: DC

## 2016-08-31 MED ORDER — HEPARIN SODIUM (PORCINE) 1000 UNIT/ML IJ SOLN
INTRAMUSCULAR | Status: DC | PRN
  Administered 2016-08-31: 3000 [IU] via INTRAVENOUS

## 2016-08-31 MED ORDER — ATORVASTATIN CALCIUM 10 MG PO TABS
10.0000 mg | ORAL_TABLET | Freq: Every day | ORAL | Status: DC
  Filled 2016-08-31: qty 1

## 2016-08-31 MED ORDER — SODIUM CHLORIDE 0.9 % IV SOLN: INTRAVENOUS | Status: AC

## 2016-08-31 MED ORDER — HEPARIN (PORCINE) IN NACL 2-0.9 UNIT/ML-% IJ SOLN
INTRAMUSCULAR | Status: AC
  Filled 2016-08-31: qty 500

## 2016-08-31 MED ORDER — ENOXAPARIN SODIUM 30 MG/0.3ML ~~LOC~~ SOLN: 30.0000 mg | SUBCUTANEOUS | Status: DC

## 2016-08-31 MED ORDER — AMLODIPINE BESYLATE 5 MG PO TABS
5.0000 mg | ORAL_TABLET | Freq: Every day | ORAL | Status: DC
  Administered 2016-09-01 – 2016-09-02 (×2): 5 mg via ORAL
  Filled 2016-08-31 (×2): qty 1

## 2016-08-31 MED ORDER — SODIUM CHLORIDE 0.9 % WEIGHT BASED INFUSION: 1.0000 mL/kg/h | INTRAVENOUS | Status: DC

## 2016-08-31 MED ORDER — MIDAZOLAM HCL 2 MG/2ML IJ SOLN
INTRAMUSCULAR | Status: AC
  Filled 2016-08-31: qty 2

## 2016-08-31 MED ORDER — SODIUM CHLORIDE 0.9 % IV SOLN
INTRAVENOUS | Status: DC | PRN
  Administered 2016-08-31: 20 mL/h via INTRAVENOUS

## 2016-08-31 MED ORDER — METOPROLOL SUCCINATE ER 50 MG PO TB24
100.0000 mg | ORAL_TABLET | Freq: Every day | ORAL | Status: DC
  Administered 2016-09-01 – 2016-09-02 (×2): 100 mg via ORAL
  Filled 2016-08-31 (×2): qty 2

## 2016-08-31 SURGICAL SUPPLY — 7 items
CATH OPTITORQUE JACKY 4.0 5F (CATHETERS) ×3 IMPLANT
DEVICE RAD TR BAND REGULAR (VASCULAR PRODUCTS) ×3 IMPLANT
GLIDESHEATH SLEND SS 6F .021 (SHEATH) ×3 IMPLANT
KIT MANI 3VAL PERCEP (MISCELLANEOUS) ×3 IMPLANT
PACK CARDIAC CATH (CUSTOM PROCEDURE TRAY) ×3 IMPLANT
WIRE HITORQ VERSACORE ST 145CM (WIRE) ×3 IMPLANT
WIRE ROSEN-J .035X260CM (WIRE) ×3 IMPLANT

## 2016-08-31 NOTE — Care Management Note (Signed)
Case Management Note  Patient Details  Name: Maureen Morales MRN: [id number] Date of Birth: [birth year]  Subjective/Objective:                 Admitted from home with nstemi.  Troponin peaked as an outpatient at 5.79 and trending down.  For cath today. Independent in all adls, denies issues accessing medical care, obtaining medications or with transportation.  Current with her PCP.    Action/Plan:   Expected Discharge Date:                  Expected Discharge Plan:     In-House Referral:     Discharge planning Services     Post Acute Care Choice:    Choice offered to:     DME Arranged:    DME Agency:     HH Arranged:    HH Agency:     Status of Service:     If discussed at [employer] of Stay Meetings, dates discussed:    Additional Comments:  Katrina Stack, RN 08/31/2016, 8:48 AM

## 2016-08-31 NOTE — Progress Notes (Signed)
TR band removed. Site dressed with 2x2 and tegaderm. Patient tolerated well.

## 2016-08-31 NOTE — Progress Notes (Signed)
ANTICOAGULATION CONSULT NOTE - Initial Consult  Pharmacy Consult for heparin drip Indication: chest pain/ACS  Allergies  Allergen Reactions  . Aspirin   . Atropine   . Belladonna Alkaloids   . Codeine   . Darvon   . Methocarbamol   . Penicillins   . Sulfa Antibiotics   . Iron Other (See Comments)    High dose prescription gives her diarrhea    Patient Measurements: Weight: 124 lb (56.2 kg) Heparin Dosing Weight: 56kg  Vital Signs: Temp: 98.2 F (36.8 C) (05/03 0004) Temp Source: Oral (05/03 0004) BP: 189/64 (05/03 0004) Pulse Rate: 85 (05/03 0004)  Labs:  Recent Labs  08/30/16 1410 08/30/16 2101  HGB 10.4* 11.7*  HCT 31.3* 33.6*  PLT 249 261  APTT  --  32  LABPROT  --  14.6  INR  --  1.13  CREATININE 1.19* 1.23*  CKTOTAL 192*  --   CKMB 16.8*  --   TROPONINI 5.79* 3.21*    Estimated Creatinine Clearance: 23.6 mL/min (A) (by C-G formula based on SCr of 1.23 mg/dL (H)).   Medical History: Past Medical History:  Diagnosis Date  . Diabetes mellitus   . Hyperlipidemia   . Hypertension   . Viral gastroenteritis due to [place]-like agent Nov 2012    Medications:  No anticoagulation in PTA meds.  Assessment:  Goal of Therapy:  Heparin level 0.3-0.7 units/ml Monitor platelets by anticoagulation protocol: Yes   Plan:  3400 unit bolus and initial rate of 650 units/hr. First heparin level 8 hours after start of infusion.  Maureen Morales S 08/31/2016,12:07 AM

## 2016-08-31 NOTE — Interval H&P Note (Deleted)
History and Physical Interval Note:  08/31/2016 2:34 PM  Maureen Morales  has presented today for surgery, with the diagnosis of nstemi  The various methods of treatment have been discussed with the patient and family. After consideration of risks, benefits and other options for treatment, the patient has consented to  Procedure(s): Left Heart Cath and Coronary Angiography (N/A) as a surgical intervention .  The patient's history has been reviewed, patient examined, no change in status, stable for surgery.  I have reviewed the patient's chart and labs.  Questions were answered to the patient's satisfaction.     [name]

## 2016-08-31 NOTE — H&P (View-Only) (Deleted)
[facility] NAME: Maureen Morales    MR#:  [id number]  DATE OF BIRTH:  1922/06/29  SUBJECTIVE:  CHIEF COMPLAINT:  Patient is resting comfortably. Denies any chest pain. Friend at bedside. On heparin drip.  REVIEW OF SYSTEMS:  CONSTITUTIONAL: No fever, fatigue or weakness.  EYES: No blurred or double vision.  EARS, NOSE, AND THROAT: No tinnitus or ear pain.  RESPIRATORY: No cough, shortness of breath, wheezing or hemoptysis.  CARDIOVASCULAR: No chest pain, orthopnea, edema.  GASTROINTESTINAL: No nausea, vomiting, diarrhea or abdominal pain.  GENITOURINARY: No dysuria, hematuria.  ENDOCRINE: No polyuria, nocturia,  HEMATOLOGY: No anemia, easy bruising or bleeding SKIN: No rash or lesion. MUSCULOSKELETAL: No joint pain or arthritis.   NEUROLOGIC: No tingling, numbness, weakness.  PSYCHIATRY: No anxiety or depression.   DRUG ALLERGIES:   Allergies  Allergen Reactions  . Aspirin   . Atropine   . Belladonna Alkaloids   . Codeine   . Darvon   . Methocarbamol   . Penicillins   . Sulfa Antibiotics   . Iron Other (See Comments)    High dose prescription gives her diarrhea    VITALS:  Blood pressure (!) 187/78, pulse 80, temperature 98.3 F (36.8 C), temperature source Oral, resp. rate (!) 21, height 5\' 2"  (1.575 m), weight 56.2 kg (124 lb), SpO2 98 %.  PHYSICAL EXAMINATION:  GENERAL:  83 y.o.-year-old patient lying in the bed with no acute distress.  EYES: Pupils equal, round, reactive to light and accommodation. No scleral icterus. Extraocular muscles intact.  HEENT: Head atraumatic, normocephalic. Oropharynx and nasopharynx clear.  NECK:  Supple, no jugular venous distention. No thyroid enlargement, no tenderness.  LUNGS: Normal breath sounds bilaterally, no wheezing, rales,rhonchi or crepitation. No use of accessory muscles of respiration.  CARDIOVASCULAR: S1, S2 normal. No murmurs, rubs, or gallops.  ABDOMEN: Soft,  nontender, nondistended. Bowel sounds present. No organomegaly or mass.  EXTREMITIES: No pedal edema, cyanosis, or clubbing.  NEUROLOGIC: Cranial nerves II through XII are intact. Muscle strength 5/5 in all extremities. Sensation intact. Gait not checked.  PSYCHIATRIC: The patient is alert and oriented x 3.  SKIN: No obvious rash, lesion, or ulcer.    LABORATORY PANEL:   CBC  Recent Labs Lab 08/31/16 0552  WBC 6.9  HGB 10.6*  HCT 30.8*  PLT 229   ------------------------------------------------------------------------------------------------------------------  Chemistries   Recent Labs Lab 08/30/16 1410  08/31/16 0552  NA 137  < > 139  K 4.1  < > 3.6  CL 100  < > 106  CO2 29  < > 26  GLUCOSE 139*  < > 152*  BUN 31*  < > 34*  CREATININE 1.19*  < > 1.00  CALCIUM 9.9  < > 9.0  AST 29  --   --   ALT 25  --   --   ALKPHOS 46  --   --   BILITOT 0.4  --   --   < > = values in this interval not displayed. ------------------------------------------------------------------------------------------------------------------  Cardiac Enzymes  Recent Labs Lab 08/31/16 1137  TROPONINI 2.34*   ------------------------------------------------------------------------------------------------------------------  RADIOLOGY:  Dg Chest 2 View  Result Date: 08/30/2016 CLINICAL DATA:  Chest pain EXAM: CHEST  2 VIEW COMPARISON:  08/27/2012 FINDINGS: Cardiac shadow is within normal limits. The lungs demonstrates some mild interstitial change without focal confluent infiltrate. No sizable effusion is seen. No bony abnormality is noted. IMPRESSION: Mild interstitial changes which are likely chronic in  etiology. No focal confluent infiltrate is seen. Electronically Signed   By: Inez Catalina M.D.   On: 08/30/2016 14:23    EKG:   Orders placed or performed during the hospital encounter of 08/30/16  . EKG 12-Lead  . EKG 12-Lead    ASSESSMENT AND PLAN:     #NSTEMI (non-ST elevated  myocardial infarction) (HCC) -Continue heparin drip. Patient is scheduled for cardiac catheterization today. Follow-up on the echocardiogram. Follow-up with cardiology Dr. Rockey Situ  continue Avapro, metoprolol. Nitroglycerin as needed for chest pain. We'll start her on Lipitor    Accelerated hypertension - blood pressure was significantly elevated, increase Toprol-XL to 75 mg. Continue Avapro and Norvasc titrate as needed     Type II diabetes mellitus with nephropathy (HCC) - holding metformin, resume 48 hours after cardiac CATH. sliding scale insulin with corresponding glucose checks    Hyperlipidemia - continue home meds    GERD (gastroesophageal reflux disease) - continue PPI    All the records are reviewed and case discussed with Care Management/Social Workerr. Management plans discussed with the patient, family and they are in agreement.  CODE STATUS: fc   TOTAL TIME TAKING CARE OF THIS PATIENT: 21minutes.   POSSIBLE D/C IN 1-2 DAYS, DEPENDING ON CLINICAL CONDITION.  Note: This dictation was prepared with Dragon dictation along with smaller phrase technology. Any transcriptional errors that result from this process are unintentional.   Nicholes Mango M.D on 08/31/2016 at 1:47 PM  Between 7am to 6pm - Pager - [phone] After 6pm go to [email or web address] - password [email or web address] [facility]  Office  [phone]  CC: Primary care physician; [name], MD

## 2016-08-31 NOTE — Progress Notes (Signed)
[facility] [place] NAME: Maureen Morales    MR#:  [id number]  DATE OF BIRTH:  02/22/1923  SUBJECTIVE:  CHIEF COMPLAINT:  Patient is resting comfortably. Denies any chest pain. Friend at bedside. On heparin drip.  REVIEW OF SYSTEMS:  CONSTITUTIONAL: No fever, fatigue or weakness.  EYES: No blurred or double vision.  EARS, NOSE, AND THROAT: No tinnitus or ear pain.  RESPIRATORY: No cough, shortness of breath, wheezing or hemoptysis.  CARDIOVASCULAR: No chest pain, orthopnea, edema.  GASTROINTESTINAL: No nausea, vomiting, diarrhea or abdominal pain.  GENITOURINARY: No dysuria, hematuria.  ENDOCRINE: No polyuria, nocturia,  HEMATOLOGY: No anemia, easy bruising or bleeding SKIN: No rash or lesion. MUSCULOSKELETAL: No joint pain or arthritis.   NEUROLOGIC: No tingling, numbness, weakness.  PSYCHIATRY: No anxiety or depression.   DRUG ALLERGIES:   Allergies  Allergen Reactions  . Aspirin   . Atropine   . Belladonna Alkaloids   . Codeine   . Darvon   . Methocarbamol   . Penicillins   . Sulfa Antibiotics   . Iron Other (See Comments)    High dose prescription gives her diarrhea    VITALS:  Blood pressure (!) 187/78, pulse 80, temperature 98.3 F (36.8 C), temperature source Oral, resp. rate (!) 21, height 5\' 2"  (1.575 m), weight 56.2 kg (124 lb), SpO2 98 %.  PHYSICAL EXAMINATION:  GENERAL:  83 y.o.-year-old patient lying in the bed with no acute distress.  EYES: Pupils equal, round, reactive to light and accommodation. No scleral icterus. Extraocular muscles intact.  HEENT: Head atraumatic, normocephalic. Oropharynx and nasopharynx clear.  NECK:  Supple, no jugular venous distention. No thyroid enlargement, no tenderness.  LUNGS: Normal breath sounds bilaterally, no wheezing, rales,rhonchi or crepitation. No use of accessory muscles of respiration.  CARDIOVASCULAR: S1, S2 normal. No murmurs, rubs, or gallops.  ABDOMEN: Soft,  nontender, nondistended. Bowel sounds present. No organomegaly or mass.  EXTREMITIES: No pedal edema, cyanosis, or clubbing.  NEUROLOGIC: Cranial nerves II through XII are intact. Muscle strength 5/5 in all extremities. Sensation intact. Gait not checked.  PSYCHIATRIC: The patient is alert and oriented x 3.  SKIN: No obvious rash, lesion, or ulcer.    LABORATORY PANEL:   CBC  Recent Labs Lab 08/31/16 0552  WBC 6.9  HGB 10.6*  HCT 30.8*  PLT 229   ------------------------------------------------------------------------------------------------------------------  Chemistries   Recent Labs Lab 08/30/16 1410  08/31/16 0552  NA 137  < > 139  K 4.1  < > 3.6  CL 100  < > 106  CO2 29  < > 26  GLUCOSE 139*  < > 152*  BUN 31*  < > 34*  CREATININE 1.19*  < > 1.00  CALCIUM 9.9  < > 9.0  AST 29  --   --   ALT 25  --   --   ALKPHOS 46  --   --   BILITOT 0.4  --   --   < > = values in this interval not displayed. ------------------------------------------------------------------------------------------------------------------  Cardiac Enzymes  Recent Labs Lab 08/31/16 1137  TROPONINI 2.34*   ------------------------------------------------------------------------------------------------------------------  RADIOLOGY:  Dg Chest 2 View  Result Date: 08/30/2016 CLINICAL DATA:  Chest pain EXAM: CHEST  2 VIEW COMPARISON:  08/27/2012 FINDINGS: Cardiac shadow is within normal limits. The lungs demonstrates some mild interstitial change without focal confluent infiltrate. No sizable effusion is seen. No bony abnormality is noted. IMPRESSION: Mild interstitial changes which are likely chronic in  etiology. No focal confluent infiltrate is seen. Electronically Signed   By: Inez Catalina M.D.   On: 08/30/2016 14:23    EKG:   Orders placed or performed during the hospital encounter of 08/30/16  . EKG 12-Lead  . EKG 12-Lead    ASSESSMENT AND PLAN:     #NSTEMI (non-ST elevated  myocardial infarction) (HCC) -Continue heparin drip. Patient is scheduled for cardiac catheterization today. Follow-up on the echocardiogram. Follow-up with cardiology Dr. Rockey Situ  continue Avapro, metoprolol. Nitroglycerin as needed for chest pain. We'll start her on Lipitor    Accelerated hypertension - blood pressure was significantly elevated, increase Toprol-XL to 75 mg. Continue Avapro and Norvasc titrate as needed     Type II diabetes mellitus with nephropathy (HCC) - holding metformin, resume 48 hours after cardiac CATH. sliding scale insulin with corresponding glucose checks    Hyperlipidemia - continue home meds    GERD (gastroesophageal reflux disease) - continue PPI    All the records are reviewed and case discussed with Care Management/Social Workerr. Management plans discussed with the patient, family and they are in agreement.  CODE STATUS: fc   TOTAL TIME TAKING CARE OF THIS PATIENT: 102minutes.   POSSIBLE D/C IN 1-2 DAYS, DEPENDING ON CLINICAL CONDITION.  Note: This dictation was prepared with Dragon dictation along with smaller phrase technology. Any transcriptional errors that result from this process are unintentional.   Nicholes Mango M.D on 08/31/2016 at 1:47 PM  Between 7am to 6pm - Pager - [phone] After 6pm go to [email or web address] - password [email or web address] [facility]  Office  [phone]  CC: Primary care physician; [name], MD

## 2016-08-31 NOTE — Progress Notes (Signed)
ANTICOAGULATION CONSULT NOTE - Initial Consult  Pharmacy Consult for heparin drip Indication: chest pain/ACS  Allergies  Allergen Reactions  . Aspirin   . Atropine   . Belladonna Alkaloids   . Codeine   . Darvon   . Methocarbamol   . Penicillins   . Sulfa Antibiotics   . Iron Other (See Comments)    High dose prescription gives her diarrhea    Patient Measurements: Weight: 124 lb (56.2 kg) Heparin Dosing Weight: 56kg  Vital Signs: Temp: 97.8 F (36.6 C) (05/03 0831) Temp Source: Oral (05/03 0831) BP: 159/67 (05/03 0831) Pulse Rate: 72 (05/03 0831)  Labs:  Recent Labs  08/30/16 1410 08/30/16 2101 08/31/16 0014 08/31/16 0552 08/31/16 0756  HGB 10.4* 11.7*  --  10.6*  --   HCT 31.3* 33.6*  --  30.8*  --   PLT 249 261  --  229  --   APTT  --  32  --   --   --   LABPROT  --  14.6  --   --   --   INR  --  1.13  --   --   --   HEPARINUNFRC  --   --   --   --  0.19*  CREATININE 1.19* 1.23*  --  1.00  --   CKTOTAL 192*  --   --   --   --   CKMB 16.8*  --   --   --   --   TROPONINI 5.79* 3.21* 3.05* 2.35*  --     Estimated Creatinine Clearance: 29 mL/min (by C-G formula based on SCr of 1 mg/dL).   Medical History: Past Medical History:  Diagnosis Date  . Diabetes mellitus   . Hyperlipidemia   . Hypertension   . Viral gastroenteritis due to [place]-like agent Nov 2012    Medications:  No anticoagulation in PTA meds.  Assessment:  Goal of Therapy:  Heparin level 0.3-0.7 units/ml Monitor platelets by anticoagulation protocol: Yes   Plan:  3400 unit bolus and initial rate of 650 units/hr. First heparin level 8 hours after start of infusion.  5/3 0756 HL subtherapeutic x 1. 1700 units IV x 1 bolus and increase rate to 850 units/hr. Will recheck HL in 8 hours.  Laural Benes, Pharm.D., BCPS Clinical Pharmacist 08/31/2016,9:20 AM

## 2016-08-31 NOTE — Consult Note (Signed)
Cardiology Consultation Note  Patient ID: Maureen Morales, MRN: [id number], DOB/AGE: March 28, 1923 83 y.o. Admit date: 08/30/2016   Date of Consult: 08/31/2016 Primary Physician: [name], MD Primary Cardiologist: Dr. Fletcher Anon, MD Requesting Physician: Dr. Jannifer Franklin, MD  Chief Complaint: Chest pain Reason for Consult: NSTEMI  HPI: Maureen Morales is a 83 y.o. female who is being seen today for the evaluation of NSTEMI at the request of Dr. Jannifer Franklin, MD. Patient has a h/o mild aortic stenosis, DM2, HTN, and HLD who presented to [facility] on 5/2 with a NSTEMI.   Patient woke up with substernal chest pain around 5-5:30 AM on 5/2. Pain was dull in character and lasted through breakfast then started to ease off though was still present intermittently just not as severe as the pain that woke her up. During the morning she proceeded to get her hair done and go to the grocery store. Her daughter called the patient's PCP who advised the patient come in for evaluation. At her PCP's office EKG was not acute. Outpatient labs were drawn, including a troponin that came back at 5.79. She was advised to come to the ED. Some associated fatigue and weakness. Pain did not radiate. No associated SOB, nausea, vomiting, diaphoresis, dizziness, presyncope or syncope.   Upon the patient's arrival to [facility] they were found to have BP 229/71, HR 82 bpm, temp 98.5, oxygen saturation 98% on room air, weight 124 pounds. EKG showed NSR, 81 bpm, possible prior anterior infarct, no acute st/t changes, CXR showed chronic changes. Labs showed initial troponin of 5.79 (drawn as an outpatient) and have trended to 3.21-->3.05-->2.35. CBC with wbc 8.1, hgb 11.7, plt 261, SCr 1.23-->1.00, K+ 3.7-->3.6, A1c pending. TTE pending. She was started on a heparin gtt. Blood pressure has improved to the 034J systolic. Currently chest pain-free.   Past Medical History:  Diagnosis Date  . Diabetes mellitus   . Hyperlipidemia   . Hypertension   . Viral  gastroenteritis due to Norwalk-like agent Nov 2012      Most Recent Cardiac Studies: TTE 06/2016: Study Conclusions  - Left ventricle: The cavity size was normal. Systolic function was   normal. The estimated ejection fraction was in the range of 60%   to 65%. Wall motion was normal; there were no regional wall   motion abnormalities. Doppler parameters are consistent with   abnormal left ventricular relaxation (grade 1 diastolic   dysfunction). - Aortic valve: There was very mild stenosis. Peak gradient (S): 29   mm Hg. - Mitral valve: Calcified annulus. There was mild regurgitation. - Right ventricle: Systolic function was normal. - Pulmonary arteries: Systolic pressure was mildly elevated. PA   peak pressure: 35 mm Hg (S).   Surgical History:  Past Surgical History:  Procedure Laterality Date  . ABDOMINAL HYSTERECTOMY    . BLADDER SURGERY    . CARDIAC CATHETERIZATION    . CHOLECYSTECTOMY       Home Meds: Prior to Admission medications   Medication Sig Start Date End Date Taking? Authorizing Provider  amLODipine (NORVASC) 2.5 MG tablet Take 1 tablet (2.5 mg total) by mouth daily. 08/30/16  Yes [name], MD  BD MICROTAINER LANCETS MISC Contact  Activated lancet 1.8 mm x 21 g Use once daily to check blood sugars 07/10/16  Yes [name], MD  Calcium Carb-Cholecalciferol (CALCIUM 600 + D PO) Take by mouth daily.   Yes Historical Provider, MD  calcium carbonate (TUMS) 500 MG chewable tablet Chew 1  tablet by mouth. Taken when needed   Yes Historical Provider, MD  fenofibrate 160 MG tablet TAKE 1 TABLET BY MOUTH DAILY FOR HIGH TRIGLYCERIDES 06/28/16  Yes [name], MD  glucose blood (BAYER CONTOUR TEST) test strip Use as instructed 02/28/13  Yes [name], MD  ibuprofen (ADVIL,MOTRIN) 200 MG tablet Take 200 mg by mouth as needed.     Yes Historical Provider, MD  loratadine (CLARITIN) 10 MG tablet Take 10 mg by mouth daily.   Yes Historical Provider, MD  metFORMIN  (GLUCOPHAGE-XR) 750 MG 24 hr tablet TAKE 1 TABLET BY MOUTH DAILY WITH BREAKFAST 06/28/16  Yes [name], MD  metoprolol succinate (TOPROL-XL) 50 MG 24 hr tablet TAKE 1 TABLET BY MOUTH DAILY 06/28/16  Yes [name], MD  Multiple Vitamins-Minerals (ABC PLUS SENIOR PO) Take by mouth.     Yes Historical Provider, MD  mupirocin ointment (BACTROBAN) 2 % Apply 1 application topically 2 (two) times daily. To left earlobe 08/28/16  Yes [name], MD  Omega-3 Fatty Acids (FISH OIL) 1200 MG CAPS Take by mouth.     Yes Historical Provider, MD  pantoprazole (PROTONIX) 40 MG tablet Take 1 tablet (40 mg total) by mouth daily. 08/30/16  Yes [name], MD  Probiotic Product ([place] PO) Take 1 tablet by mouth daily.     Yes Historical Provider, MD  valsartan (DIOVAN) 320 MG tablet TAKE 1 TABLET BY MOUTH DAILY 06/28/16  Yes [name], MD    Inpatient Medications:  . amLODipine  2.5 mg Oral Daily  . insulin aspart  0-9 Units Subcutaneous Q6H  . irbesartan  300 mg Oral Daily  . metoprolol succinate  50 mg Oral Daily  . pantoprazole  40 mg Oral Daily   . sodium chloride 75 mL/hr at 08/31/16 0043  . heparin 650 Units/hr (08/30/16 2334)    Allergies:  Allergies  Allergen Reactions  . Aspirin   . Atropine   . Belladonna Alkaloids   . Codeine   . Darvon   . Methocarbamol   . Penicillins   . Sulfa Antibiotics   . Iron Other (See Comments)    High dose prescription gives her diarrhea    Social History   Social History  . Marital status: Married    Spouse name: N/A  . Number of children: N/A  . Years of education: N/A   Occupational History  . Not on file.   Social History Main Topics  . Smoking status: Never Smoker  . Smokeless tobacco: Never Used  . Alcohol use No  . Drug use: No  . Sexual activity: Not on file   Other Topics Concern  . Not on file   Social History Narrative  . No narrative on file     Family History  Problem Relation Age of Onset    . Cancer Mother   . Breast cancer Mother 29  . Breast cancer Sister 31     Review of Systems: Review of Systems  Constitutional: Positive for malaise/fatigue. Negative for chills, diaphoresis, fever and weight loss.  HENT: Negative for congestion.   Eyes: Negative for discharge and redness.  Respiratory: Negative for cough, hemoptysis, sputum production, shortness of breath and wheezing.   Cardiovascular: Positive for chest pain. Negative for palpitations, orthopnea, claudication, leg swelling and PND.  Gastrointestinal: Negative for abdominal pain, blood in stool, heartburn, melena, nausea and vomiting.  Genitourinary: Negative for hematuria.  Musculoskeletal: Negative for falls and  myalgias.  Skin: Negative for rash.  Neurological: Positive for weakness and headaches. Negative for dizziness, tingling, tremors, sensory change, speech change, focal weakness and loss of consciousness.  Endo/Heme/Allergies: Does not bruise/bleed easily.  Psychiatric/Behavioral: Negative for substance abuse. The patient is not nervous/anxious.   All other systems reviewed and are negative.   Labs:  Recent Labs  08/30/16 1410 08/30/16 2101 08/31/16 0014 08/31/16 0552  CKTOTAL 192*  --   --   --   CKMB 16.8*  --   --   --   TROPONINI 5.79* 3.21* 3.05* 2.35*   Lab Results  Component Value Date   WBC 6.9 08/31/2016   HGB 10.6 (L) 08/31/2016   HCT 30.8 (L) 08/31/2016   MCV 84.7 08/31/2016   PLT 229 08/31/2016    Recent Labs Lab 08/30/16 1410  08/31/16 0552  NA 137  < > 139  K 4.1  < > 3.6  CL 100  < > 106  CO2 29  < > 26  BUN 31*  < > 34*  CREATININE 1.19*  < > 1.00  CALCIUM 9.9  < > 9.0  PROT 6.4  --   --   BILITOT 0.4  --   --   ALKPHOS 46  --   --   ALT 25  --   --   AST 29  --   --   GLUCOSE 139*  < > 152*  < > = values in this interval not displayed. Lab Results  Component Value Date   CHOL 150 11/29/2015   HDL 56.10 11/29/2015   LDLCALC 74 11/29/2015   TRIG 102.0  11/29/2015   No results found for: DDIMER  Radiology/Studies:  Dg Chest 2 View  Result Date: 08/30/2016 CLINICAL DATA:  Chest pain EXAM: CHEST  2 VIEW COMPARISON:  08/27/2012 FINDINGS: Cardiac shadow is within normal limits. The lungs demonstrates some mild interstitial change without focal confluent infiltrate. No sizable effusion is seen. No bony abnormality is noted. IMPRESSION: Mild interstitial changes which are likely chronic in etiology. No focal confluent infiltrate is seen. Electronically Signed   By: Inez Catalina M.D.   On: 08/30/2016 14:23    EKG: Interpreted by me showed: NSR, 81 bpm, possible prior anterior infarct, no acute st/t changes  Telemetry: Interpreted by me showed: sinus rhythm, 70s bpm  Weights: Filed Weights   08/30/16 2200  Weight: 124 lb (56.2 kg)     Physical Exam: Blood pressure (!) 153/55, pulse 74, temperature 98 F (36.7 C), temperature source Oral, resp. rate 18, weight 124 lb (56.2 kg), SpO2 100 %. Body mass index is 22.68 kg/m. General: Well developed, well nourished, in no acute distress. Head: Normocephalic, atraumatic, sclera non-icteric, no xanthomas, nares are without discharge.  Neck: Right-sided carotid bruits vs radiation from cardiac murmur. JVD not elevated. Lungs: Clear bilaterally to auscultation without wheezes, rales, or rhonchi. Breathing is unlabored. Heart: RRR with S1 S2. II/VI systolic murmur, no rubs, or gallops appreciated. Abdomen: Soft, non-tender, non-distended with normoactive bowel sounds. No hepatomegaly. No rebound/guarding. No obvious abdominal masses. Msk:  Strength and tone appear normal for age. Extremities: No clubbing or cyanosis. No edema. Distal pedal pulses are 2+ and equal bilaterally. Neuro: Alert and oriented X 3. No facial asymmetry. No focal deficit. Moves all extremities spontaneously. Psych:  Responds to questions appropriately with a normal affect.    Assessment and Plan:  Principal Problem:   NSTEMI  (non-ST elevated myocardial infarction) ([place]) Active Problems:   Accelerated  hypertension   Type II diabetes mellitus with nephropathy (HCC)   Hyperlipidemia   Aortic stenosis, mild   GERD (gastroesophageal reflux disease)    1. NSTEMI: -Currently chest pain-free -Troponin peak of 5.79 (initial troponin drawn as an outpatient), now down trending -Heparin gtt -For LHC with Dr. Fletcher Anon, MD today -Risks and benefits of cardiac catheterization have been discussed with the patient and family including risks of bleeding, bruising, infection, kidney damage, stroke, heart attack, and death. The patient understands these risks and is willing to proceed with the procedure. All questions have been answered and concerns listened to  -Check TTE -No ASA was given as she has history of anaphylaxis with ASA -Toprol XL -Check lipid panel, start Lipitor   2. Malignant HTN: -Likely in the setting of the above -Improving -Titrate Toprol XL to 75 mg daily -If needed, can titrate amlodipine to 5 mg daily -Has an headache, improving  3. Mild aortic stenosis: -TTE pending -Possible right-sided carotid bruit vs cardiac murmur, check carotid ultrasound  -Outpatient follow up  4. DM2: -Per IM -Metformin held upon admission, will need to hold x 4 8 hours after cath  5. HLD: -Check lipid and liver panel -Lipitor   Signed, Christell Faith, PA-C [facility] Pager: [phone] 08/31/2016, 8:22 AM

## 2016-08-31 NOTE — Progress Notes (Addendum)
Anticoagulation monitoring(Lovenox):  83 yo female ordered Lovenox 40 mg Q24h  Filed Weights   08/30/16 2200 08/31/16 1228  Weight: 124 lb (56.2 kg) 124 lb (56.2 kg)   BMI    Lab Results  Component Value Date   CREATININE 1.00 08/31/2016   CREATININE 1.23 (H) 08/30/2016   CREATININE 1.19 (H) 08/30/2016   Estimated Creatinine Clearance: 29 mL/min (by C-G formula based on SCr of 1 mg/dL). Hemoglobin & Hematocrit     Component Value Date/Time   HGB 10.6 (L) 08/31/2016 0552   HCT 30.8 (L) 08/31/2016 3474     Per Protocol for Patient with estCrcl < 30 ml/min and BMI < 40, will transition to Lovenox 30 mg Q24h.     Changed back to 40 mg for CrCl now >30.  Sim Boast, PharmD, BCPS  09/01/16 6:21 AM

## 2016-09-01 ENCOUNTER — Encounter: Payer: Self-pay | Admitting: Cardiovascular Disease

## 2016-09-01 ENCOUNTER — Inpatient Hospital Stay: Payer: Medicare Other

## 2016-09-01 ENCOUNTER — Inpatient Hospital Stay (HOSPITAL_COMMUNITY)
Admit: 2016-09-01 | Discharge: 2016-09-01 | Disposition: A | Discharge status: Home / Self Care | Payer: Medicare Other | Attending: Cardiovascular Disease | Admitting: Cardiovascular Disease

## 2016-09-01 DIAGNOSIS — I340 Nonrheumatic mitral (valve) insufficiency: Secondary | ICD-10-CM

## 2016-09-01 DIAGNOSIS — I361 Nonrheumatic tricuspid (valve) insufficiency: Secondary | ICD-10-CM

## 2016-09-01 LAB — GLUCOSE, CAPILLARY
Glucose-Capillary: 123 mg/dL — ABNORMAL HIGH (ref 65–99)
Glucose-Capillary: 138 mg/dL — ABNORMAL HIGH (ref 65–99)
Glucose-Capillary: 146 mg/dL — ABNORMAL HIGH (ref 65–99)
Glucose-Capillary: 184 mg/dL — ABNORMAL HIGH (ref 65–99)
Glucose-Capillary: 260 mg/dL — ABNORMAL HIGH (ref 65–99)

## 2016-09-01 LAB — CBC
HCT: 31.9 % — ABNORMAL LOW (ref 35.0–47.0)
Hemoglobin: 10.6 g/dL — ABNORMAL LOW (ref 12.0–16.0)
MCH: 28.4 pg (ref 26.0–34.0)
MCHC: 33.3 g/dL (ref 32.0–36.0)
MCV: 85.1 fL (ref 80.0–100.0)
PLATELETS: 222 10*3/uL (ref 150–440)
RBC: 3.75 MIL/uL — ABNORMAL LOW (ref 3.80–5.20)
RDW: 13.6 % (ref 11.5–14.5)
WBC: 5.8 10*3/uL (ref 3.6–11.0)

## 2016-09-01 LAB — BASIC METABOLIC PANEL
Anion gap: 4 — ABNORMAL LOW (ref 5–15)
BUN: 27 mg/dL — AB (ref 6–20)
CALCIUM: 9 mg/dL (ref 8.9–10.3)
CO2: 27 mmol/L (ref 22–32)
Chloride: 110 mmol/L (ref 101–111)
Creatinine, Ser: 0.9 mg/dL (ref 0.44–1.00)
GFR calc Af Amer: >60 mL/min (ref >60)
GFR, EST NON AFRICAN AMERICAN: 54 mL/min — AB (ref >60)
GLUCOSE: 109 mg/dL — AB (ref 65–99)
Potassium: 3.7 mmol/L (ref 3.5–5.1)
SODIUM: 141 mmol/L (ref 135–145)

## 2016-09-01 LAB — ECHOCARDIOGRAM COMPLETE
Height: 62 in
Weight: 1984 oz

## 2016-09-01 LAB — HEMOGLOBIN A1C
HEMOGLOBIN A1C: 7.5 % — AB (ref 4.8–5.6)
MEAN PLASMA GLUCOSE: 169 mg/dL

## 2016-09-01 MED ORDER — ENOXAPARIN SODIUM 40 MG/0.4ML ~~LOC~~ SOLN
40.0000 mg | SUBCUTANEOUS | Status: DC
  Administered 2016-09-01: 40 mg via SUBCUTANEOUS
  Filled 2016-09-01 (×2): qty 0.4

## 2016-09-01 MED ORDER — CALCIUM CARBONATE ANTACID 500 MG PO CHEW
1.0000 | CHEWABLE_TABLET | Freq: Once | ORAL | Status: AC
  Administered 2016-09-01: 200 mg via ORAL
  Filled 2016-09-01: qty 1

## 2016-09-01 NOTE — Progress Notes (Signed)
Progress Note  Patient Name: Maureen Morales Date of Encounter: 09/01/2016  Primary Cardiologist: Dr. Fletcher Anon  Subjective   The patient feels better overall with minimal chest discomfort at the present time. Blood pressure continues to be mildly elevated. Cardiac catheterization yesterday showed mild to moderate nonobstructive coronary artery disease. Antihypertensive medications were increased.  Inpatient Medications    Scheduled Meds: . amLODipine  5 mg Oral Daily  . atorvastatin  40 mg Oral q1800  . clopidogrel  75 mg Oral Q breakfast  . enoxaparin (LOVENOX) injection  40 mg Subcutaneous Q24H  . insulin aspart  0-9 Units Subcutaneous Q6H  . irbesartan  300 mg Oral Daily  . metoprolol succinate  100 mg Oral Daily  . pantoprazole  40 mg Oral Daily  . sodium chloride flush  3 mL Intravenous Q12H   Continuous Infusions: . sodium chloride     PRN Meds: sodium chloride, acetaminophen **OR** acetaminophen, fentaNYL (SUBLIMAZE) injection, labetalol, nitroGLYCERIN, ondansetron **OR** ondansetron (ZOFRAN) IV, oxyCODONE, sodium chloride flush   Vital Signs    Vitals:   08/31/16 2141 09/01/16 0006 09/01/16 0613 09/01/16 0740  BP: (!) 181/60 (!) 148/50 (!) 181/67 (!) 155/61  Pulse: 73 68 69 83  Resp:   18 15  Temp:   97.9 F (36.6 C) 98.8 F (37.1 C)  TempSrc:   Oral Oral  SpO2:   97% 96%  Weight:      Height:        Intake/Output Summary (Last 24 hours) at 09/01/16 0832 Last data filed at 09/01/16 7096  Gross per 24 hour  Intake                0 ml  Output             1100 ml  Net            -1100 ml   Filed Weights   08/30/16 2200 08/31/16 1228  Weight: 124 lb (56.2 kg) 124 lb (56.2 kg)    Telemetry    Normal sinus rhythm  - Personally Reviewed  ECG    Not done today.  - Personally Reviewed  Physical Exam   GEN: No acute distress.   Neck: No JVD Cardiac: RRR, no  rubs, or gallops. 2/6 crescendo decrescendo systolic murmur in the aortic area to the mid to  mid peaking. Respiratory: Clear to auscultation bilaterally. GI: Soft, nontender, non-distended  MS: No edema; No deformity. Neuro:  Nonfocal  Psych: Normal affect  Right radial pulse is normal with no hematoma.  Labs    Chemistry Recent Labs Lab 08/30/16 1410 08/30/16 2101 08/31/16 0552 09/01/16 0501  NA 137 137 139 141  K 4.1 3.7 3.6 3.7  CL 100 102 106 110  CO2 29 28 26 27   GLUCOSE 139* 200* 152* 109*  BUN 31* 36* 34* 27*  CREATININE 1.19* 1.23* 1.00 0.90  CALCIUM 9.9 10.0 9.0 9.0  PROT 6.4  --   --   --   ALBUMIN 4.1  --   --   --   AST 29  --   --   --   ALT 25  --   --   --   ALKPHOS 46  --   --   --   BILITOT 0.4  --   --   --   GFRNONAA  --  37* 48* 54*  GFRAA  --  43* 55* >60  ANIONGAP  --  7 7 4*  Hematology Recent Labs Lab 08/30/16 2101 08/31/16 0552 09/01/16 0501  WBC 8.1 6.9 5.8  RBC 3.94 3.63* 3.75*  HGB 11.7* 10.6* 10.6*  HCT 33.6* 30.8* 31.9*  MCV 85.2 84.7 85.1  MCH 29.7 29.3 28.4  MCHC 34.8 34.5 33.3  RDW 13.6 13.9 13.6  PLT 261 229 222    Cardiac Enzymes Recent Labs Lab 08/30/16 2101 08/31/16 0014 08/31/16 0552 08/31/16 1137  TROPONINI 3.21* 3.05* 2.35* 2.34*   No results for input(s): TROPIPOC in the last 168 hours.   BNP Recent Labs Lab 08/30/16 2101  BNP 203.0*     DDimer No results for input(s): DDIMER in the last 168 hours.   Radiology    Dg Chest 2 View  Result Date: 08/30/2016 CLINICAL DATA:  Chest pain EXAM: CHEST  2 VIEW COMPARISON:  08/27/2012 FINDINGS: Cardiac shadow is within normal limits. The lungs demonstrates some mild interstitial change without focal confluent infiltrate. No sizable effusion is seen. No bony abnormality is noted. IMPRESSION: Mild interstitial changes which are likely chronic in etiology. No focal confluent infiltrate is seen. Electronically Signed   By: Inez Catalina M.D.   On: 08/30/2016 14:23    Cardiac Studies   Cardiac cath 05/03: 1. Mild to moderate nonobstructive coronary  artery disease. 2. Normal LV systolic function. 3. Mild aortic stenosis with a peak to peak gradient of 15 mmHg. There is also possibly a mild LVOT gradient. 4. Severely elevated systemic pressure with moderately elevated left ventricular end-diastolic pressure.  Patient Profile     83 y.o. female with history of type 2 diabetes, hypertension and mild aortic stenosis presented with non-ST elevation myocardial infarction with a peak troponin of 5 in the setting of severely elevated blood pressure. Blood pressure on presentation was 229/71.  Assessment & Plan    1. Non-ST elevation myocardial infarction: Cardiac catheterization showed mild to moderate nonobstructive coronary artery disease. Worse stenosis was 60% mid RCA disease. No culprit was identified. I suspect that her myocardial infarction was likely due to supply demand ischemia in the setting of severely elevated blood pressure. The patient is allergic to aspirin and thus I started her on Plavix. Recommend blood pressure control.  2. Essential hypertension: The dose of Toprol was increased to 100 mg once daily and amlodipine to 5 mg once daily. She is also on Avapro. If blood pressure continues to be uncontrolled, I will consider outpatient renal artery duplex.  3. Mild aortic stenosis: peak gradient was 15 mmHg during cardiac catheterization. There was also mild LVOT gradient. Hopefully this will improve with increasing Toprol.  4. Hyperlipidemia: The dose of atorvastatin was increased to 40 mg once daily.  Disposition: The patient is getting an echocardiogram today. Blood pressure is still mildly elevated and hopefully this will improve after giving her her pain medications. Recommend ambulation in the afternoon and if she is feeling well, she can be discharged home to follow-up with [place] in one week.  Signed, [name], MD  09/01/2016, 8:32 AM

## 2016-09-01 NOTE — Care Management (Signed)
Barrier to discharge - elevated blood pressure.  CM informed that physical therapy is recommending home health physical therapy.  Patient and her daughter are undecided about whether would like to have home health.  They have nurses in the home providing care to patient's husband.  Deferred decision regarding home health.  No agency preference if decides to pursue. [facility]  has provided home health in the home in the past.  Was not given permission to pursue a referral

## 2016-09-01 NOTE — Care Management Important Message (Signed)
Important Message  Patient Details  Name: GIANNI FUCHS MRN: [id number] Date of Birth: [birth year]   Medicare Important Message Given:  Yes Signed IM notice given - given to daughter Doreen Beam, RN 09/01/2016, 11:25 AM

## 2016-09-01 NOTE — Evaluation (Signed)
Physical Therapy Evaluation Patient Details Name: Maureen Morales MRN: [id number] DOB: [birth year] Today's Date: 09/01/2016   History of Present Illness  83 y/o female here with chest pain, admitted with NSTEMI - found to have elevated troponins has been having hypertension.   Clinical Impression  Pt was able to ambulate with walker ~60 ft though she was not overly confident and not near her baseline.  She did have elevated BP t/o the session with 170s/60s in supine and gradually increasing BP with activity until after ambulation she was at 196/69.  Pt did not feel overly fatigued with the effort but generally is not feeling her normal self.  Family reports they will have 24/7 assist and feel confident about her being able to go home safely once medically clear.  HHPT will be necessary as she is not near her baseline functionally.    Follow Up Recommendations Home health PT    Equipment Recommendations       Recommendations for Other Services       Precautions / Restrictions Precautions Precautions: Fall Restrictions Weight Bearing Restrictions: No      Mobility  Bed Mobility Overal bed mobility: Modified Independent             General bed mobility comments: Pt needed rail to get to sitting, but did not need any direct assist from PT  Transfers Overall transfer level: Independent Equipment used: Rolling walker (2 wheeled)             General transfer comment: Pt was able to rise to standing w/o assist, she was initially a little unsteady but ultimately was safe and showed good relative confidence.  Ambulation/Gait Ambulation/Gait assistance: Min guard Ambulation Distance (Feet): 60 Feet Assistive device: Rolling walker (2 wheeled)       General Gait Details: Pt with hesitating, cautious gait.  She had what appeared to be consistent modest knee buckling with stop/go walker use inconsistent cadence. Pt with no LOBs but not near her baseline.   Stairs             Wheelchair Mobility    Modified Rankin (Stroke Patients Only)       Balance Overall balance assessment: Needs assistance Sitting-balance support: Bilateral upper extremity supported Sitting balance-Leahy Scale: Good     Standing balance support: Bilateral upper extremity supported Standing balance-Leahy Scale: Fair Standing balance comment: Pt normally does not need an AD, she was reliant on UEs to maintain balance in standing and with walking                             Pertinent Vitals/Pain Pain Assessment: No/denies pain    Home Living Family/patient expects to be discharged to:: Private residence Living Arrangements: Spouse/significant other Available Help at Discharge: Family;Friend(s)           Home Equipment: Gilford Rile - 2 wheels      Prior Function Level of Independence: Independent         Comments: Pt does not normally need AD, able to drive, run errands, stay active     Hand Dominance        Extremity/Trunk Assessment   Upper Extremity Assessment Upper Extremity Assessment: Overall WFL for tasks assessed (hesitant with R UE secondary to cath)    Lower Extremity Assessment Lower Extremity Assessment: Overall WFL for tasks assessed;Generalized weakness (age appropriate limitations)       Communication   Communication: No difficulties  Cognition Arousal/Alertness:  Awake/alert Behavior During Therapy: WFL for tasks assessed/performed Overall Cognitive Status: Within Functional Limits for tasks assessed                                        General Comments      Exercises     Assessment/Plan    PT Assessment Patient needs continued PT services  PT Problem List Decreased strength;Decreased range of motion;Decreased activity tolerance;Decreased balance;Decreased mobility;Decreased coordination;Decreased knowledge of use of DME;Decreased safety awareness;Cardiopulmonary status limiting activity       PT  Treatment Interventions DME instruction;Gait training;Stair training;Functional mobility training;Therapeutic exercise;Therapeutic activities;Balance training;Neuromuscular re-education;Cognitive remediation;Patient/family education    PT Goals (Current goals can be found in the Care Plan section)  Acute Rehab PT Goals Patient Stated Goal: go home PT Goal Formulation: With patient Time For Goal Achievement: 09/15/16 Potential to Achieve Goals: Fair    Frequency Min 2X/week   Barriers to discharge        Co-evaluation               AM-PAC PT "6 Clicks" Daily Activity  Outcome Measure Difficulty turning over in bed (including adjusting bedclothes, sheets and blankets)?: A Little Difficulty moving from lying on back to sitting on the side of the bed? : A Little Difficulty sitting down on and standing up from a chair with arms (e.g., wheelchair, bedside commode, etc,.)?: A Little Help needed moving to and from a bed to chair (including a wheelchair)?: A Little Help needed walking in hospital room?: A Little Help needed climbing 3-5 steps with a railing? : A Little 6 Click Score: 18    End of Session Equipment Utilized During Treatment: Gait belt Activity Tolerance: Patient limited by fatigue Patient left: with chair alarm set;with call bell/phone within reach;with family/visitor present Nurse Communication: Mobility status PT Visit Diagnosis: Muscle weakness (generalized) (M62.81);Difficulty in walking, not elsewhere classified (R26.2)    Time: 5726-2035 PT Time Calculation (min) (ACUTE ONLY): 26 min   Charges:   PT Evaluation $PT Eval Low Complexity: 1 Procedure     PT G Codes:        Kreg Shropshire, DPT 09/01/2016, 4:29 PM

## 2016-09-01 NOTE — Progress Notes (Signed)
Pt's BP is elevated this morning, offered pt and daughter PRN Labetalol, daughter refuses, offered to give morning BP medications early but pt's daughter prefers to wait for her brother to get here because "he knows about blood pressure medicine." Information about Avapro, Amlodipine, and metoprolol was provided to pt and daughter. Will continue to monitor.

## 2016-09-01 NOTE — Progress Notes (Signed)
*  PRELIMINARY RESULTS* Echocardiogram 2D Echocardiogram has been performed.  Sherrie Sport 09/01/2016, 9:05 AM

## 2016-09-01 NOTE — Progress Notes (Signed)
[place] [facility] NAME: Maureen Morales    MR#:  [id number]  DATE OF BIRTH:  [birth year]  SUBJECTIVE:  CHIEF COMPLAINT:  Patient is resting comfortably. Denies any chest pain. Daughter at bed side. Patient had cardiac catheterization yesterday  REVIEW OF SYSTEMS:  CONSTITUTIONAL: No fever, fatigue or weakness.  EYES: No blurred or double vision.  EARS, NOSE, AND THROAT: No tinnitus or ear pain.  RESPIRATORY: No cough, shortness of breath, wheezing or hemoptysis.  CARDIOVASCULAR: No chest pain, orthopnea, edema.  GASTROINTESTINAL: No nausea, vomiting, diarrhea or abdominal pain.  GENITOURINARY: No dysuria, hematuria.  ENDOCRINE: No polyuria, nocturia,  HEMATOLOGY: No anemia, easy bruising or bleeding SKIN: No rash or lesion. MUSCULOSKELETAL: No joint pain or arthritis.   NEUROLOGIC: No tingling, numbness, weakness.  PSYCHIATRY: No anxiety or depression.   DRUG ALLERGIES:   Allergies  Allergen Reactions  . Aspirin   . Atropine   . Belladonna Alkaloids   . Codeine   . Darvon   . Methocarbamol   . Penicillins   . Sulfa Antibiotics   . Iron Other (See Comments)    High dose prescription gives her diarrhea    VITALS:  Blood pressure (!) 171/65, pulse 78, temperature 98.8 F (37.1 C), temperature source Oral, resp. rate 15, height 5\' 2"  (1.575 m), weight 56.2 kg (124 lb), SpO2 (!) 87 %.  PHYSICAL EXAMINATION:  GENERAL:  83 y.o.-year-old patient lying in the bed with no acute distress.  EYES: Pupils equal, round, reactive to light and accommodation. No scleral icterus. Extraocular muscles intact.  HEENT: Head atraumatic, normocephalic. Oropharynx and nasopharynx clear.  NECK:  Supple, no jugular venous distention. No thyroid enlargement, no tenderness.  LUNGS: Normal breath sounds bilaterally, no wheezing, rales,rhonchi or crepitation. No use of accessory muscles of respiration.  CARDIOVASCULAR: S1, S2 normal. No murmurs,  rubs, or gallops.  ABDOMEN: Soft, nontender, nondistended. Bowel sounds present. No organomegaly or mass.  EXTREMITIES: No pedal edema, cyanosis, or clubbing.  NEUROLOGIC: Cranial nerves II through XII are intact. Muscle strength 5/5 in all extremities. Sensation intact. Gait not checked.  PSYCHIATRIC: The patient is alert and oriented x 3.  SKIN: No obvious rash, lesion, or ulcer.    LABORATORY PANEL:   CBC  Recent Labs Lab 09/01/16 0501  WBC 5.8  HGB 10.6*  HCT 31.9*  PLT 222   ------------------------------------------------------------------------------------------------------------------  Chemistries   Recent Labs Lab 08/30/16 1410  09/01/16 0501  NA 137  < > 141  K 4.1  < > 3.7  CL 100  < > 110  CO2 29  < > 27  GLUCOSE 139*  < > 109*  BUN 31*  < > 27*  CREATININE 1.19*  < > 0.90  CALCIUM 9.9  < > 9.0  AST 29  --   --   ALT 25  --   --   ALKPHOS 46  --   --   BILITOT 0.4  --   --   < > = values in this interval not displayed. ------------------------------------------------------------------------------------------------------------------  Cardiac Enzymes  Recent Labs Lab 08/31/16 1137  TROPONINI 2.34*   ------------------------------------------------------------------------------------------------------------------  RADIOLOGY:  US Carotid Bilateral  Result Date: 09/01/2016 CLINICAL DATA:  83 year old female with a history of bruit. Cardiovascular risk factors include hypertension, hyperlipidemia, diabetes EXAM: BILATERAL CAROTID DUPLEX ULTRASOUND TECHNIQUE: Pearline Cables scale imaging, color Doppler and duplex ultrasound were performed of bilateral carotid and vertebral arteries in the neck. COMPARISON:  03/28/2011 FINDINGS: Criteria: Quantification  of carotid stenosis is based on velocity parameters that correlate the residual internal carotid diameter with NASCET-based stenosis levels, using the diameter of the distal internal carotid lumen as the denominator for  stenosis measurement. The following velocity measurements were obtained: RIGHT ICA:  Systolic 053 cm/sec, Diastolic 35 cm/sec CCA:  976 cm/sec SYSTOLIC ICA/CCA RATIO:  1.2 ECA:  106 cm/sec LEFT ICA:  Systolic 734 cm/sec, Diastolic 24 cm/sec CCA:  79 cm/sec SYSTOLIC ICA/CCA RATIO:  1.3 ECA:  101 cm/sec Right Brachial SBP: Not acquired Left Brachial SBP: Not acquired RIGHT CAROTID ARTERY: No significant calcifications of the right common carotid artery. Intermediate waveform maintained. Heterogeneous and partially calcified plaque at the right carotid bifurcation. No significant lumen shadowing. Low resistance waveform of the right ICA. Mild tortuosity RIGHT VERTEBRAL ARTERY: Antegrade flow with low resistance waveform. LEFT CAROTID ARTERY: No significant calcifications of the left common carotid artery. Intermediate waveform maintained. Heterogeneous and partially calcified plaque at the left carotid bifurcation without significant lumen shadowing. Low resistance waveform of the left ICA. Mild tortuosity LEFT VERTEBRAL ARTERY:  Antegrade flow with low resistance waveform. IMPRESSION: Color duplex indicates moderate heterogeneous and calcified plaque, with no hemodynamically significant stenosis by duplex criteria in the extracranial cerebrovascular circulation. Signed, Dulcy Fanny. Earleen Newport, DO [facility] Radiology Electronically Signed   By: Corrie Mckusick D.O.   On: 09/01/2016 09:50    EKG:   Orders placed or performed during the hospital encounter of 08/30/16  . EKG 12-Lead  . EKG 12-Lead    ASSESSMENT AND PLAN:     #NSTEMI (non-ST elevated myocardial infarction) ([place]) -Secondary to uncontrolled hypertension Patient had cardiac catheterization on May 3. No stents were placed. Medical management advised by cardiology discontinued heparin drip. Patient is started on Plavix 75 mg. Patient is allergic to aspirin. Toprol-XL dose increased 100 mg . Lipitor 40 mg  Follow-up with cardiology Dr. Rockey Situ  Echocardiogram is done and results pending     Accelerated hypertension - blood pressure was significantly elevated, Toprol-XL increased to 100 mg and amlodipine at 5 mg once daily. Continue home medication Avapro titrate as needed.]    Type II diabetes mellitus with nephropathy (HCC) - holding metformin, resume 48 hours after cardiac CATH. sliding scale insulin with corresponding glucose checks    Hyperlipidemia - Lipitor 40    GERD (gastroesophageal reflux disease) - continue PPI   Generalized weakness with unsteady gait per RN -PT evaluation is pending  All the records are reviewed and case discussed with Care Management/Social Workerr. Management plans discussed with the patient, family and they are in agreement.  CODE STATUS: fc   TOTAL TIME TAKING CARE OF THIS PATIENT: 86minutes.   POSSIBLE D/C IN 1-2 DAYS, DEPENDING ON CLINICAL CONDITION.  Note: This dictation was prepared with Dragon dictation along with smaller phrase technology. Any transcriptional errors that result from this process are unintentional.   Nicholes Mango M.D on 09/01/2016 at 2:59 PM  Between 7am to 6pm - Pager - [phone] After 6pm go to [email or web address] - password [email or web address] [facility]  Office  [phone]  CC: Primary care physician; [name], MD

## 2016-09-02 ENCOUNTER — Other Ambulatory Visit: Payer: Self-pay | Admitting: Internal Medicine

## 2016-09-02 LAB — GLUCOSE, CAPILLARY: Glucose-Capillary: 155 mg/dL — ABNORMAL HIGH (ref 65–99)

## 2016-09-02 MED ORDER — ATORVASTATIN CALCIUM 40 MG PO TABS: 40.0000 mg | ORAL_TABLET | Freq: Every day | ORAL | 0 refills | Status: DC

## 2016-09-02 MED ORDER — NITROGLYCERIN 0.4 MG SL SUBL
0.4000 mg | SUBLINGUAL_TABLET | SUBLINGUAL | 0 refills | Status: DC | PRN
Start: 2016-09-02 — End: 2018-07-29

## 2016-09-02 MED ORDER — METOPROLOL SUCCINATE ER 100 MG PO TB24: 100.0000 mg | ORAL_TABLET | Freq: Every day | ORAL | 0 refills | Status: DC

## 2016-09-02 MED ORDER — ACETAMINOPHEN 325 MG PO TABS: 650.0000 mg | ORAL_TABLET | Freq: Four times a day (QID) | ORAL | Status: DC | PRN

## 2016-09-02 MED ORDER — CLOPIDOGREL BISULFATE 75 MG PO TABS: 75.0000 mg | ORAL_TABLET | Freq: Every day | ORAL | 0 refills | Status: DC

## 2016-09-02 MED ORDER — AMLODIPINE BESYLATE 5 MG PO TABS: 5.0000 mg | ORAL_TABLET | Freq: Every day | ORAL | 0 refills | Status: DC

## 2016-09-02 NOTE — Discharge Instructions (Signed)
Follow-up with primary care physician in 5-6 days Follow-up with cardiology Dr. Fletcher Anon in a week Home health PT

## 2016-09-02 NOTE — Plan of Care (Signed)
Problem: Safety: Goal: Ability to remain free from injury will improve Outcome: Progressing Pt exit alarm activated. Pt encouraged to call out for assistance with activity.

## 2016-09-02 NOTE — Discharge Summary (Signed)
[facility] [place] NAME: Maureen Morales    MR#:  [id number]  DATE OF BIRTH:  March 04, 1923  DATE OF ADMISSION:  08/30/2016 ADMITTING PHYSICIAN: Lance Coon, MD  DATE OF DISCHARGE: 09/02/16 PRIMARY CARE PHYSICIAN: [name], MD    ADMISSION DIAGNOSIS:  Hypertensive urgency [I16.0] Elevated troponin I level [R74.8] Chest pain, unspecified type [R07.9]  DISCHARGE DIAGNOSIS:  Principal Problem:   NSTEMI (non-ST elevated myocardial infarction) ([place]) Active Problems:   Accelerated hypertension   Type II diabetes mellitus with nephropathy (HCC)   Hyperlipidemia   Aortic stenosis, mild   GERD (gastroesophageal reflux disease)   SECONDARY DIAGNOSIS:   Past Medical History:  Diagnosis Date  . Diabetes mellitus   . Hyperlipidemia   . Hypertension   . Viral gastroenteritis due to [place]-like agent Nov 2012    HOSPITAL COURSE:  HPI Maureen Morales  is a 83 y.o. female who presents with Pain that started early this morning. She states that she woke up from her sleep in the early morning hours to use the bathroom and had significant chest pain. This pain persisted, though improved some throughout the day. She went to see her doctor ordered basic workup including cardiac enzyme. This later resulted positive and the patient was called to come to the ED for further evaluation. Here her troponin has dropped to 3 on recheck from the initial value of 5. Heparin drip was ordered, and hospitalists were called for admission.  #NSTEMI (non-ST elevated myocardial infarction) ([place]) -Secondary to uncontrolled hypertension Patient had cardiac catheterization on May 3. No stents were placed. Medical management advised by cardiology discontinued heparin drip. Patient is started on Plavix 75 mg. Patient is allergic to aspirin. Toprol-XL dose increased 100 mg . Lipitor 40 mg Follow-up with cardiology Echocardiogram Has revealed ejection fraction 65-70% . No  wall motion abnormalities.  aortic wall with mild stenosis  Accelerated hypertension - blood pressure Is better. Patient being elderly at age 83, goal is to maintain her systolic blood pressure at around 160. Toprol-XL increased to 100 mg and amlodipine at 5 mg once daily. Continue home medication valsartan 320 mg once daily . Outpatient follow-up with cardiology and primary care physician in a week   Type I.I diabetes mellitus with nephropathy ([place]) - holding metformin, resumed 48 hours after cardiac CATH. sliding scale insulin with corresponding glucose checks provided during the hospital course.   Hyperlipidemia - Lipitor 40. Continue home medication fenofibrate, omega-3 fatty acids  GERD (gastroesophageal reflux disease) - continue PPI   Generalized weakness with unsteady gait per RN -PT is recommending home health PT   DISCHARGE CONDITIONS:   fair  CONSULTS OBTAINED:  Treatment Team:  Rise Mu, PA-C   PROCEDURES  Cardiac cath 08/31/16  DRUG ALLERGIES:   Allergies  Allergen Reactions  . Aspirin   . Atropine   . Belladonna Alkaloids   . Codeine   . Darvon   . Methocarbamol   . Penicillins   . Sulfa Antibiotics   . Iron Other (See Comments)    High dose prescription gives her diarrhea    DISCHARGE MEDICATIONS:   Current Discharge Medication List    START taking these medications   Details  acetaminophen (TYLENOL) 325 MG tablet Take 2 tablets (650 mg total) by mouth every 6 (six) hours as needed for mild pain (or Fever >/= 101).    atorvastatin (LIPITOR) 40 MG tablet Take 1 tablet (40 mg total) by mouth daily  at 6 PM. Qty: 30 tablet, Refills: 0    clopidogrel (PLAVIX) 75 MG tablet Take 1 tablet (75 mg total) by mouth daily with breakfast. Qty: 30 tablet, Refills: 0    nitroGLYCERIN (NITROSTAT) 0.4 MG SL tablet Place 1 tablet (0.4 mg total) under the tongue every 5 (five) minutes as needed for chest pain. Qty: 10 tablet, Refills: 0       CONTINUE these medications which have CHANGED   Details  amLODipine (NORVASC) 5 MG tablet Take 1 tablet (5 mg total) by mouth daily. Qty: 30 tablet, Refills: 0    metoprolol succinate (TOPROL-XL) 100 MG 24 hr tablet Take 1 tablet (100 mg total) by mouth daily. Take with or immediately following a meal. Qty: 30 tablet, Refills: 0      CONTINUE these medications which have NOT CHANGED   Details  BD MICROTAINER LANCETS MISC Contact  Activated lancet 1.8 mm x 21 g Use once daily to check blood sugars Qty: 100 each, Refills: 1    Calcium Carb-Cholecalciferol (CALCIUM 600 + D PO) Take by mouth daily.    calcium carbonate (TUMS) 500 MG chewable tablet Chew 1 tablet by mouth. Taken when needed    fenofibrate 160 MG tablet TAKE 1 TABLET BY MOUTH DAILY FOR HIGH TRIGLYCERIDES Qty: 30 tablet, Refills: 3    glucose blood (BAYER CONTOUR TEST) test strip Use as instructed Qty: 100 each, Refills: 5    loratadine (CLARITIN) 10 MG tablet Take 10 mg by mouth daily.    metFORMIN (GLUCOPHAGE-XR) 750 MG 24 hr tablet TAKE 1 TABLET BY MOUTH DAILY WITH BREAKFAST Qty: 30 tablet, Refills: 3    Multiple Vitamins-Minerals (ABC PLUS SENIOR PO) Take by mouth.      mupirocin ointment (BACTROBAN) 2 % Apply 1 application topically 2 (two) times daily. To left earlobe Qty: 22 g, Refills: 0    Omega-3 Fatty Acids (FISH OIL) 1200 MG CAPS Take by mouth.      pantoprazole (PROTONIX) 40 MG tablet Take 1 tablet (40 mg total) by mouth daily. Qty: 30 tablet, Refills: 0    Probiotic Product (PHILLIPS COLON HEALTH PO) Take 1 tablet by mouth daily.      valsartan (DIOVAN) 320 MG tablet TAKE 1 TABLET BY MOUTH DAILY Qty: 30 tablet, Refills: 3      STOP taking these medications     ibuprofen (ADVIL,MOTRIN) 200 MG tablet          DISCHARGE INSTRUCTIONS:   Follow-up with primary care physician in 5-6 days Follow-up with cardiology Dr. Fletcher Anon in a week Home health PT   DIET:  Cardiac diet  DISCHARGE  CONDITION:  Fair  ACTIVITY:  Activity as tolerated per PT  OXYGEN:  Home Oxygen: No.   Oxygen Delivery: room air  DISCHARGE LOCATION:  home   If you experience worsening of your admission symptoms, develop shortness of breath, life threatening emergency, suicidal or homicidal thoughts you must seek medical attention immediately by calling 911 or calling your MD immediately  if symptoms less severe.  You Must read complete instructions/literature along with all the possible adverse reactions/side effects for all the Medicines you take and that have been prescribed to you. Take any new Medicines after you have completely understood and accpet all the possible adverse reactions/side effects.   Please note  You were cared for by a hospitalist during your hospital stay. If you have any questions about your discharge medications or the care you received while you were in the hospital after  you are discharged, you can call the unit and asked to speak with the hospitalist on call if the hospitalist that took care of you is not available. Once you are discharged, your primary care physician will handle any further medical issues. Please note that NO REFILLS for any discharge medications will be authorized once you are discharged, as it is imperative that you return to your primary care physician (or establish a relationship with a primary care physician if you do not have one) for your aftercare needs so that they can reassess your need for medications and monitor your lab values.     Today  Chief Complaint  Patient presents with  . Chest Pain   Patient is anxious to go home. Denies any chest pain or shortness of breath. Daughters at bedside.  ROS:  CONSTITUTIONAL: Denies fevers, chills. Denies any fatigue, weakness.  EYES: Denies blurry vision, double vision, eye pain. EARS, NOSE, THROAT: Denies tinnitus, ear pain, hearing loss. RESPIRATORY: Denies cough, wheeze, shortness of breath.   CARDIOVASCULAR: Denies chest pain, palpitations, edema.  GASTROINTESTINAL: Denies nausea, vomiting, diarrhea, abdominal pain. Denies bright red blood per rectum. GENITOURINARY: Denies dysuria, hematuria. ENDOCRINE: Denies nocturia or thyroid problems. HEMATOLOGIC AND LYMPHATIC: Denies easy bruising or bleeding. SKIN: Denies rash or lesion. MUSCULOSKELETAL: Denies pain in neck, back, shoulder, knees, hips or arthritic symptoms.  NEUROLOGIC: Denies paralysis, paresthesias.  PSYCHIATRIC: Denies anxiety or depressive symptoms.   VITAL SIGNS:  Blood pressure (!) 166/69, pulse 70, temperature 98.3 F (36.8 C), temperature source Oral, resp. rate 16, height 5\' 2"  (1.575 m), weight 56.2 kg (124 lb), SpO2 98 %.  I/O:    Intake/Output Summary (Last 24 hours) at 09/02/16 0928 Last data filed at 09/02/16 0537  Gross per 24 hour  Intake                0 ml  Output              550 ml  Net             -550 ml    PHYSICAL EXAMINATION:  GENERAL:  83 y.o.-year-old patient lying in the bed with no acute distress.  EYES: Pupils equal, round, reactive to light and accommodation. No scleral icterus. Extraocular muscles intact.  HEENT: Head atraumatic, normocephalic. Oropharynx and nasopharynx clear.  NECK:  Supple, no jugular venous distention. No thyroid enlargement, no tenderness.  LUNGS: Normal breath sounds bilaterally, no wheezing, rales,rhonchi or crepitation. No use of accessory muscles of respiration.  CARDIOVASCULAR: S1, S2 normal. No murmurs, rubs, or gallops.  ABDOMEN: Soft, non-tender, non-distended. Bowel sounds present. No organomegaly or mass.  EXTREMITIES:  right wrist area POST cath area with clean dressing No pedal edema, cyanosis, or clubbing.  NEUROLOGIC: Cranial nerves II through XII are intact. Muscle strength 5/5 in all extremities. Sensation intact. Gait not checked.  PSYCHIATRIC: The patient is alert and oriented x 3.  SKIN: No obvious rash, lesion, or ulcer.   DATA  REVIEW:   CBC  Recent Labs Lab 09/01/16 0501  WBC 5.8  HGB 10.6*  HCT 31.9*  PLT 222    Chemistries   Recent Labs Lab 08/30/16 1410  09/01/16 0501  NA 137  < > 141  K 4.1  < > 3.7  CL 100  < > 110  CO2 29  < > 27  GLUCOSE 139*  < > 109*  BUN 31*  < > 27*  CREATININE 1.19*  < > 0.90  CALCIUM 9.9  < >  9.0  AST 29  --   --   ALT 25  --   --   ALKPHOS 46  --   --   BILITOT 0.4  --   --   < > = values in this interval not displayed.  Cardiac Enzymes  Recent Labs Lab 08/31/16 1137  TROPONINI 2.34*    Microbiology Results  Results for orders placed or performed in visit on 02/25/15  Urine Culture     Status: None   Collection Time: 02/25/15  2:49 PM  Result Value Ref Range Status   Colony Count 50,000 COLONIES/ML  Final   Organism ID, Bacteria Multiple bacterial morphotypes present, none  Final   Organism ID, Bacteria predominant. Suggest appropriate recollection if   Final   Organism ID, Bacteria clinically indicated.  Final    RADIOLOGY:  Dg Chest 2 View  Result Date: 08/30/2016 CLINICAL DATA:  Chest pain EXAM: CHEST  2 VIEW COMPARISON:  08/27/2012 FINDINGS: Cardiac shadow is within normal limits. The lungs demonstrates some mild interstitial change without focal confluent infiltrate. No sizable effusion is seen. No bony abnormality is noted. IMPRESSION: Mild interstitial changes which are likely chronic in etiology. No focal confluent infiltrate is seen. Electronically Signed   By: Inez Catalina M.D.   On: 08/30/2016 14:23   US Carotid Bilateral  Result Date: 09/01/2016 CLINICAL DATA:  83 year old female with a history of bruit. Cardiovascular risk factors include hypertension, hyperlipidemia, diabetes EXAM: BILATERAL CAROTID DUPLEX ULTRASOUND TECHNIQUE: Pearline Cables scale imaging, color Doppler and duplex ultrasound were performed of bilateral carotid and vertebral arteries in the neck. COMPARISON:  03/28/2011 FINDINGS: Criteria: Quantification of carotid stenosis is  based on velocity parameters that correlate the residual internal carotid diameter with NASCET-based stenosis levels, using the diameter of the distal internal carotid lumen as the denominator for stenosis measurement. The following velocity measurements were obtained: RIGHT ICA:  Systolic 409 cm/sec, Diastolic 35 cm/sec CCA:  811 cm/sec SYSTOLIC ICA/CCA RATIO:  1.2 ECA:  106 cm/sec LEFT ICA:  Systolic 914 cm/sec, Diastolic 24 cm/sec CCA:  79 cm/sec SYSTOLIC ICA/CCA RATIO:  1.3 ECA:  101 cm/sec Right Brachial SBP: Not acquired Left Brachial SBP: Not acquired RIGHT CAROTID ARTERY: No significant calcifications of the right common carotid artery. Intermediate waveform maintained. Heterogeneous and partially calcified plaque at the right carotid bifurcation. No significant lumen shadowing. Low resistance waveform of the right ICA. Mild tortuosity RIGHT VERTEBRAL ARTERY: Antegrade flow with low resistance waveform. LEFT CAROTID ARTERY: No significant calcifications of the left common carotid artery. Intermediate waveform maintained. Heterogeneous and partially calcified plaque at the left carotid bifurcation without significant lumen shadowing. Low resistance waveform of the left ICA. Mild tortuosity LEFT VERTEBRAL ARTERY:  Antegrade flow with low resistance waveform. IMPRESSION: Color duplex indicates moderate heterogeneous and calcified plaque, with no hemodynamically significant stenosis by duplex criteria in the extracranial cerebrovascular circulation. Signed, Dulcy Fanny. Earleen Newport, DO [facility] Electronically Signed   By: Corrie Mckusick D.O.   On: 09/01/2016 09:50    EKG:   Orders placed or performed during the hospital encounter of 08/30/16  . EKG 12-Lead  . EKG 12-Lead      Management plans discussed with the patient, family and they are in agreement.  CODE STATUS:     Code Status Orders        Start     Ordered   08/30/16 2348  Full  code  Continuous     08/30/16 2347  Code Status History    Date Active Date Inactive Code Status Order ID Comments User Context   This patient has a current code status but no historical code status.    Advance Directive Documentation     Most Recent Value  Type of Advance Directive  Living will  Pre-existing out of facility DNR order (yellow form or pink MOST form)  -  "MOST" Form in Place?  -      TOTAL TIME TAKING CARE OF THIS PATIENT: 45 minutes.   Note: This dictation was prepared with Dragon dictation along with smaller phrase technology. Any transcriptional errors that result from this process are unintentional.   @M[facility] @  on 09/02/2016 at 9:28 AM  Between 7am to 6pm - Pager - [phone]  After 6pm go to [email or web address] - password [email or web address] [facility]  Office  [phone]  CC: Primary care physician; [name], MD

## 2016-09-02 NOTE — Care Management Note (Signed)
Case Management Note  Patient Details  Name: Maureen Morales MRN: [id number] Date of Birth: Jul 06, 1922  Subjective/Objective:    Message left on cell phone of daughter [name] requesting a call back to discuss discharge planning. Do they want HH-PT and if so, which provider would they prefer.                 Action/Plan:   Expected Discharge Date:  09/02/16               Expected Discharge Plan:  Home/Self Care  In-House Referral:     Discharge planning Services     Post Acute Care Choice:    Choice offered to:     DME Arranged:    DME Agency:     HH Arranged:    HH Agency:     Status of Service:  In process, will continue to follow  If discussed at Long Length of Stay Meetings, dates discussed:    Additional Comments:  Caralina Nop A, RN 09/02/2016, 9:28 AM

## 2016-09-04 ENCOUNTER — Telehealth: Payer: Self-pay | Admitting: Internal Medicine

## 2016-09-04 ENCOUNTER — Other Ambulatory Visit: Payer: Self-pay

## 2016-09-04 ENCOUNTER — Telehealth: Payer: Self-pay | Admitting: Cardiovascular Disease

## 2016-09-04 DIAGNOSIS — I10 Essential (primary) hypertension: Secondary | ICD-10-CM

## 2016-09-04 NOTE — Telephone Encounter (Signed)
Per Dr. Fletcher Anon, pt needs renal artery duplex to evaluate HTN prior to f/u OV. Scheduled 5/9 @ 8:30am Attempted to contact pt. No answer, no voice mail on home phone. No answer, voice mailbox is full on cell number.

## 2016-09-04 NOTE — Telephone Encounter (Signed)
First attempt made to reach patient for TCM call for hospital follow up no answer on home phone no voicemail called daughter mobile number  voicemail is full.

## 2016-09-04 NOTE — Telephone Encounter (Signed)
Pt called back returning your call. Please advise, thank you!  Call pt @ [phone]

## 2016-09-04 NOTE — Telephone Encounter (Signed)
S/w pt's daughter, [name], who is agreeable to renal artery duplex 5/9, 8:30am.

## 2016-09-04 NOTE — Telephone Encounter (Signed)
Transition Care Management Follow-up Telephone Call  How have you been since you were released from the hospital? Caretaker stated patient  feels weak this morning.   Do you understand why you were in the hospital? "Yes , I had a heart attack."   Do you understand the discharge instrcutions? Yes.   Items Reviewed:  Medications reviewed: Yes  Allergies reviewed: yes  Dietary changes reviewed: Yes heart heaalthy  Referrals reviewed: yes   Functional Questionnaire:   Activities of Daily Living (ADLs):   She states they are independent in the following:Patient independent in ADLs. Per patient. States they require assistance with the following: using walker for stability and shower chair for bathing.   Any transportation issues/concerns?: No   Any patient concerns? Yes, I just feel tired and want to know how long this last patient stated her back is hurting. Patient ask if her metformin is going to increased her fasting CBG  225.    Confirmed importance and date/time of follow-up visits scheduled: yes  Confirmed with patient if condition begins to worsen call PCP or go to the ER.  Patient was given the Call-a-Nurse line [phone]: Yes

## 2016-09-06 ENCOUNTER — Ambulatory Visit: Payer: Medicare Other

## 2016-09-06 DIAGNOSIS — I10 Essential (primary) hypertension: Secondary | ICD-10-CM

## 2016-09-07 ENCOUNTER — Encounter: Payer: Self-pay | Admitting: Internal Medicine

## 2016-09-07 ENCOUNTER — Ambulatory Visit (INDEPENDENT_AMBULATORY_CARE_PROVIDER_SITE_OTHER): Payer: Medicare Other | Admitting: Internal Medicine

## 2016-09-07 VITALS — BP 118/64 | HR 79 | Temp 98.3°F | Resp 16 | Wt 123.4 lb

## 2016-09-07 DIAGNOSIS — D631 Anemia in chronic kidney disease: Secondary | ICD-10-CM

## 2016-09-07 DIAGNOSIS — F411 Generalized anxiety disorder: Secondary | ICD-10-CM

## 2016-09-07 DIAGNOSIS — R2681 Unsteadiness on feet: Secondary | ICD-10-CM

## 2016-09-07 DIAGNOSIS — Z09 Encounter for follow-up examination after completed treatment for conditions other than malignant neoplasm: Secondary | ICD-10-CM | POA: Diagnosis not present

## 2016-09-07 DIAGNOSIS — N189 Chronic kidney disease, unspecified: Secondary | ICD-10-CM | POA: Diagnosis not present

## 2016-09-07 DIAGNOSIS — I214 Non-ST elevation (NSTEMI) myocardial infarction: Secondary | ICD-10-CM | POA: Diagnosis not present

## 2016-09-07 DIAGNOSIS — E1121 Type 2 diabetes mellitus with diabetic nephropathy: Secondary | ICD-10-CM | POA: Diagnosis not present

## 2016-09-07 DIAGNOSIS — I10 Essential (primary) hypertension: Secondary | ICD-10-CM

## 2016-09-07 DIAGNOSIS — D508 Other iron deficiency anemias: Secondary | ICD-10-CM

## 2016-09-07 DIAGNOSIS — E7800 Pure hypercholesterolemia, unspecified: Secondary | ICD-10-CM | POA: Diagnosis not present

## 2016-09-07 LAB — CBC WITH DIFFERENTIAL/PLATELET
BASOS ABS: 0.1 10*3/uL (ref 0.0–0.1)
Basophils Relative: 0.8 % (ref 0.0–3.0)
EOS ABS: 0.2 10*3/uL (ref 0.0–0.7)
Eosinophils Relative: 3.4 % (ref 0.0–5.0)
HEMATOCRIT: 31.1 % — AB (ref 36.0–46.0)
HEMOGLOBIN: 10.6 g/dL — AB (ref 12.0–15.0)
LYMPHS PCT: 21.6 % (ref 12.0–46.0)
Lymphs Abs: 1.5 10*3/uL (ref 0.7–4.0)
MCHC: 33.9 g/dL (ref 30.0–36.0)
MCV: 85.7 fl (ref 78.0–100.0)
MONOS PCT: 10.1 % (ref 3.0–12.0)
Monocytes Absolute: 0.7 10*3/uL (ref 0.1–1.0)
NEUTROS ABS: 4.4 10*3/uL (ref 1.4–7.7)
Neutrophils Relative %: 64.1 % (ref 43.0–77.0)
PLATELETS: 329 10*3/uL (ref 150.0–400.0)
RBC: 3.63 Mil/uL — ABNORMAL LOW (ref 3.87–5.11)
RDW: 13.6 % (ref 11.5–15.5)
WBC: 6.8 10*3/uL (ref 4.0–10.5)

## 2016-09-07 LAB — BASIC METABOLIC PANEL
BUN: 41 mg/dL — ABNORMAL HIGH (ref 6–23)
CALCIUM: 10 mg/dL (ref 8.4–10.5)
CHLORIDE: 104 meq/L (ref 96–112)
CO2: 26 meq/L (ref 19–32)
CREATININE: 1.2 mg/dL (ref 0.40–1.20)
GFR: 44.71 mL/min — ABNORMAL LOW (ref >60.00)
Glucose, Bld: 148 mg/dL — ABNORMAL HIGH (ref 70–99)
Potassium: 4 mEq/L (ref 3.5–5.1)
SODIUM: 138 meq/L (ref 135–145)

## 2016-09-07 NOTE — Assessment & Plan Note (Signed)
Likely a contributor to elevated blood pressure.  She is resistant to the idea of medications

## 2016-09-07 NOTE — Assessment & Plan Note (Signed)
Diagnosed in the setting of chest pain and positive troponin of > 5 .  Cardiac cath revealed < 40% stenosis of LAD and RCA, so the etiology was attributed to malignant hypertension .  Medical management with Plavix, ASA,  Atorvastatin,  And metoprolol XL

## 2016-09-07 NOTE — Assessment & Plan Note (Addendum)
Regimen now includes amlodipine 5 mg and increased dose of metoprolol xl to 100 mg daily.  readings are still somewhat labile one week post MI.  There have been no systolic readings  < 825 and many over 150  So no changes were made today .  Reduce checks to once daily and reevaluate in one week. Home Omran automatic BP monitor is accurate based on today's comparison by dr. Derrel Nip.  Continue valsartan Toprol XL,  And amlodipine .  Renal artery doppler has been done Sep 06 2016 and results are pending

## 2016-09-07 NOTE — Assessment & Plan Note (Signed)
Mild, seconday to mild CKD.  Rechecking given recent initiation of Plavix

## 2016-09-07 NOTE — Assessment & Plan Note (Signed)
Now managed with atorvastatin secondary to NSTEMI. Will need hepatic panel  In 6 weeks   Lab Results  Component Value Date   CHOL 150 11/29/2015   HDL 56.10 11/29/2015   LDLCALC 74 11/29/2015   LDLDIRECT 92.0 07/06/2016   TRIG 102.0 11/29/2015   CHOLHDL 3 11/29/2015

## 2016-09-07 NOTE — Progress Notes (Signed)
Subjective:  Patient ID: Maureen Morales, female    DOB: 03/15/1923  Age: 83 y.o. MRN: [id number]  CC: The primary encounter diagnosis was Anemia in chronic kidney disease, unspecified CKD stage. Diagnoses of Essential hypertension, NSTEMI (non-ST elevated myocardial infarction) (Maureen Morales), Malignant essential hypertension, Type II diabetes mellitus with nephropathy (Maureen Morales), Pure hypercholesterolemia, Other iron deficiency anemia, Generalized anxiety disorder, Unsteady gait, and Hospital discharge follow-up were also pertinent to this visit.  HPI Maureen SEESE presents for [place] . Patient was admitted to [facility]  On May 2 with NSTEMI secondary to uncontrolled hypertension.  Cardiac cath done,  No obstructive disease .[name] Medications were adjusted and after several days her BP failed to improve significantly and she was discharged on May 5  In the hope that a return to her home would improve her anxiety and allow her BP to improve.  She has been taking her medications as directed and initially admitted to feeling depressed and fatigued alon gwith feeling persistently cold, and had decreased appetite for several days.  She has eliminated salt from her diet and is adjusting gradually to the taste of food . .    meidcations and all hospital reports were reviewed with patient and daughter Maureen Morales today.  New meds: Plavix, Lipitor, amlodipine 5 mg  (newly prescribed at 2.5 mg on May 2, the day she was seen for chest pain and subsequently admitted to hospital), and increased dose of metoprolol to 100 mg daily . Metformin  was held until discharge and resumed on Sunday May 6.  She has noted a gradual improvement in blood sugars over the last several days from 220 initially on Monday to 129 yesterday   Home blood pressure readings have been checked twice daily since discharge and have been  elevated to 180/85, with 5 out of 15 readings showing improved control (systolic < 742 and > 595).  The trend in the  last 4 days is significantly lower. She has brought her home Omran BP machine in for comparison with wall unit readings here in the office and there is good congruence.    Renal artery duplex was performed yesterday in Dr Tyrell Antonio office but has not been interpreted yet.   She reports an improvement in nocturia from 3 to once since her hospitalization.  She continues to have poor balance but has not fallen and has multiple caregivers at home .  She has not resumed her former busy daytime routine of cooking and is clearly demoralized by her relatively sedentary activities and the constant surveillance and  "hovering " of  Her home staff since discharge.    Outpatient Medications Prior to Visit  Medication Sig Dispense Refill  . acetaminophen (TYLENOL) 325 MG tablet Take 2 tablets (650 mg total) by mouth every 6 (six) hours as needed for mild pain (or Fever >/= 101).    [name] amLODipine (NORVASC) 5 MG tablet Take 1 tablet (5 mg total) by mouth daily. 30 tablet 0  . atorvastatin (LIPITOR) 40 MG tablet Take 1 tablet (40 mg total) by mouth daily at 6 PM. 30 tablet 0  . BD MICROTAINER LANCETS MISC Contact  Activated lancet 1.8 mm x 21 g Use once daily to check blood sugars 100 each 1  . Calcium Carb-Cholecalciferol (CALCIUM 600 + D PO) Take by mouth daily.    . calcium carbonate (TUMS) 500 MG chewable tablet Chew 1 tablet by mouth. Taken when needed    . clopidogrel (PLAVIX) 75 MG tablet Take  1 tablet (75 mg total) by mouth daily with breakfast. 30 tablet 0  . fenofibrate 160 MG tablet TAKE 1 TABLET BY MOUTH DAILY FOR HIGH TRIGLYCERIDES 30 tablet 3  . glucose blood (BAYER CONTOUR TEST) test strip Use as instructed 100 each 5  . loratadine (CLARITIN) 10 MG tablet Take 10 mg by mouth daily.    . metFORMIN (GLUCOPHAGE-XR) 750 MG 24 hr tablet TAKE 1 TABLET BY MOUTH DAILY WITH BREAKFAST 30 tablet 3  . metoprolol succinate (TOPROL-XL) 100 MG 24 hr tablet Take 1 tablet (100 mg total) by mouth daily. Take with or  immediately following a meal. 30 tablet 0  . Multiple Vitamins-Minerals (ABC PLUS SENIOR PO) Take by mouth.      . mupirocin ointment (BACTROBAN) 2 % Apply 1 application topically 2 (two) times daily. To left earlobe 22 g 0  . nitroGLYCERIN (NITROSTAT) 0.4 MG SL tablet Place 1 tablet (0.4 mg total) under the tongue every 5 (five) minutes as needed for chest pain. 10 tablet 0  . valsartan (DIOVAN) 320 MG tablet TAKE 1 TABLET BY MOUTH DAILY 30 tablet 3  . Omega-3 Fatty Acids (FISH OIL) 1200 MG CAPS Take by mouth.      . pantoprazole (PROTONIX) 40 MG tablet Take 1 tablet (40 mg total) by mouth daily. 30 tablet 0  . Probiotic Product (PHILLIPS COLON HEALTH PO) Take 1 tablet by mouth daily.       No facility-administered medications prior to visit.     Review of Systems;  Patient denies headache, fevers, malaise, unintentional weight loss, skin rash, eye pain, sinus congestion and sinus pain, sore throat, dysphagia,  hemoptysis , cough, dyspnea, wheezing, chest pain, palpitations, orthopnea, edema, abdominal pain, nausea, melena, diarrhea, constipation, flank pain, dysuria, hematuria, urinary  Frequency, nocturia, numbness, tingling, seizures,  Focal weakness, Loss of consciousness,  Tremor, insomnia, depression, anxiety, and suicidal ideation.      Objective:  BP 118/64 (BP Location: Left Arm, Patient Position: Sitting, Cuff Size: Normal)   Pulse 79   Temp 98.3 F (36.8 C) (Oral)   Resp 16   Wt 123 lb 6 oz (56 kg)   SpO2 95%   BMI 22.57 kg/m   BP Readings from Last 3 Encounters:  09/07/16 118/64  09/02/16 (!) 166/69  08/30/16 (!) 180/84    Wt Readings from Last 3 Encounters:  09/07/16 123 lb 6 oz (56 kg)  08/31/16 124 lb (56.2 kg)  08/30/16 124 lb 8 oz (56.5 kg)    General appearance: alert, cooperative and appears stated age Ears: normal TM's and external ear canals both ears Throat: lips, mucosa, and tongue normal; teeth and gums normal Neck: no adenopathy, no carotid bruit,  supple, symmetrical, trachea midline and thyroid not enlarged, symmetric, no tenderness/mass/nodules Back: symmetric, no curvature. ROM normal. No CVA tenderness. Lungs: clear to auscultation bilaterally Heart: regular rate and rhythm, S1, S2 normal, no murmur, click, rub or gallop Abdomen: soft, non-tender; bowel sounds normal; no masses,  no organomegaly Pulses: 2+ and symmetric Skin: Skin color, texture, turgor normal. No rashes or lesions Lymph nodes: Cervical, supraclavicular, and axillary nodes normal.  Lab Results  Component Value Date   HGBA1C 7.5 (H) 08/31/2016   HGBA1C 8.3 (H) 07/06/2016   HGBA1C 7.0 (H) 11/29/2015    Lab Results  Component Value Date   CREATININE 0.90 09/01/2016   CREATININE 1.00 08/31/2016   CREATININE 1.23 (H) 08/30/2016    Lab Results  Component Value Date   WBC 5.8 09/01/2016  HGB 10.6 (L) 09/01/2016   HCT 31.9 (L) 09/01/2016   PLT 222 09/01/2016   GLUCOSE 109 (H) 09/01/2016   CHOL 150 11/29/2015   TRIG 102.0 11/29/2015   HDL 56.10 11/29/2015   LDLDIRECT 92.0 07/06/2016   LDLCALC 74 11/29/2015   ALT 25 08/30/2016   AST 29 08/30/2016   NA 141 09/01/2016   K 3.7 09/01/2016   CL 110 09/01/2016   CREATININE 0.90 09/01/2016   BUN 27 (H) 09/01/2016   CO2 27 09/01/2016   TSH 3.76 08/27/2015   INR 1.13 08/30/2016   HGBA1C 7.5 (H) 08/31/2016   MICROALBUR 30.9 (H) 07/06/2016    US Carotid Bilateral  Result Date: 09/01/2016 CLINICAL DATA:  83 year old female with a history of bruit. Cardiovascular risk factors include hypertension, hyperlipidemia, diabetes EXAM: BILATERAL CAROTID DUPLEX ULTRASOUND TECHNIQUE: Pearline Cables scale imaging, color Doppler and duplex ultrasound were performed of bilateral carotid and vertebral arteries in the neck. COMPARISON:  03/28/2011 FINDINGS: Criteria: Quantification of carotid stenosis is based on velocity parameters that correlate the residual internal carotid diameter with NASCET-based stenosis levels, using the  diameter of the distal internal carotid lumen as the denominator for stenosis measurement. The following velocity measurements were obtained: RIGHT ICA:  Systolic 502 cm/sec, Diastolic 35 cm/sec CCA:  774 cm/sec SYSTOLIC ICA/CCA RATIO:  1.2 ECA:  106 cm/sec LEFT ICA:  Systolic 128 cm/sec, Diastolic 24 cm/sec CCA:  79 cm/sec SYSTOLIC ICA/CCA RATIO:  1.3 ECA:  101 cm/sec Right Brachial SBP: Not acquired Left Brachial SBP: Not acquired RIGHT CAROTID ARTERY: No significant calcifications of the right common carotid artery. Intermediate waveform maintained. Heterogeneous and partially calcified plaque at the right carotid bifurcation. No significant lumen shadowing. Low resistance waveform of the right ICA. Mild tortuosity RIGHT VERTEBRAL ARTERY: Antegrade flow with low resistance waveform. LEFT CAROTID ARTERY: No significant calcifications of the left common carotid artery. Intermediate waveform maintained. Heterogeneous and partially calcified plaque at the left carotid bifurcation without significant lumen shadowing. Low resistance waveform of the left ICA. Mild tortuosity LEFT VERTEBRAL ARTERY:  Antegrade flow with low resistance waveform. IMPRESSION: Color duplex indicates moderate heterogeneous and calcified plaque, with no hemodynamically significant stenosis by duplex criteria in the extracranial cerebrovascular circulation. Signed, Dulcy Fanny. Earleen Newport, DO [facility] Electronically Signed   By: Corrie Mckusick D.O.   On: 09/01/2016 09:50    Assessment & Plan:   Problem List Items Addressed This Visit    Unsteady gait    Preventing falls is paramount given initiation of plavix .  She is resistant to the idea of PT but is willing to attend a program if it is called "Heart Track."       Type II diabetes mellitus with nephropathy ([place])    Improved control since resuming metformin and low GI diet.  Continue metformin and valsartan  Lab Results    Component Value Date   HGBA1C 7.5 (H) 08/31/2016   Lab Results  Component Value Date   MICROALBUR 30.9 (H) 07/06/2016         NSTEMI (non-ST elevated myocardial infarction) ([place])    Diagnosed in the setting of chest pain and positive troponin of > 5 .  Cardiac cath revealed < 40% stenosis of LAD and RCA, so the etiology was attributed to malignant hypertension .  Medical management with Plavix, ASA,  Atorvastatin,  And metoprolol XL       Relevant Orders   Cardiac rehab evaluation   Malignant essential hypertension  Regimen now includes amlodipine 5 mg and increased dose of metoprolol xl to 100 mg daily.  readings are still somewhat labile one week post MI.  There have been no systolic readings  < 459 and many over 150  So no changes were made today .  Reduce checks to once daily and reevaluate in one week. Home Omran automatic BP monitor is accurate based on today's comparison by dr. Derrel Nip.  Continue valsartan Toprol XL,  And amlodipine .  Renal artery doppler has been done Sep 06 2016 and results are pending       Hyperlipidemia    Now managed with atorvastatin secondary to NSTEMI. Will need hepatic panel  In 6 weeks   Lab Results  Component Value Date   CHOL 150 11/29/2015   HDL 56.10 11/29/2015   LDLCALC 74 11/29/2015   LDLDIRECT 92.0 07/06/2016   TRIG 102.0 11/29/2015   CHOLHDL 3 11/29/2015         Hospital discharge follow-up    Patient is stable post discharge and has no new issues or questions about discharge plans at the visit today for hospital follow up. All labs , imaging studies and progress notes from admission were reviewed with patient today        Generalized anxiety disorder    Likely a contributor to elevated blood pressure.  She is resistant to the idea of medications       Anemia, iron deficiency    Mild, seconday to mild CKD.  Rechecking given recent initiation of Plavix       Other Visit Diagnoses    Anemia in chronic kidney disease,  unspecified CKD stage    -  Primary   Relevant Orders   CBC with Differential/Platelet   Essential hypertension       Relevant Orders   Basic metabolic panel      I have discontinued Ms. Selman Fish Oil, Probiotic Product ([place]), and pantoprazole. I am also having her maintain her Multiple Vitamins-Minerals (ABC PLUS SENIOR PO), calcium carbonate, glucose blood, Calcium Carb-Cholecalciferol (CALCIUM 600 + D PO), loratadine, metFORMIN, valsartan, fenofibrate, BD MICROTAINER LANCETS, mupirocin ointment, acetaminophen, amLODipine, atorvastatin, clopidogrel, nitroGLYCERIN, and metoprolol succinate.  No orders of the defined types were placed in this encounter.   Medications Discontinued During This Encounter  Medication Reason  . pantoprazole (PROTONIX) 40 MG tablet   . Omega-3 Fatty Acids (FISH OIL) 1200 MG CAPS   . Probiotic Product (PHILLIPS COLON HEALTH PO)     Follow-up: No Follow-up on file.   [name], MD

## 2016-09-07 NOTE — Assessment & Plan Note (Signed)
Preventing falls is paramount given initiation of plavix .  She is resistant to the idea of PT but is willing to attend a program if it is called "Heart Track."

## 2016-09-07 NOTE — Patient Instructions (Addendum)
You are doing very well!!  You can return to cooking!!!  You can have cheese!   Continue to check your blood pressure once daily  And we will review your readings in a week   Check your blood sugar every other day in the morning or 2 hrs after a meal    Morning : 130 or less    After a meal : 150 or less

## 2016-09-07 NOTE — Assessment & Plan Note (Signed)
Improved control since resuming metformin and low GI diet.  Continue metformin and valsartan  Lab Results  Component Value Date   HGBA1C 7.5 (H) 08/31/2016   Lab Results  Component Value Date   MICROALBUR 30.9 (H) 07/06/2016

## 2016-09-07 NOTE — Assessment & Plan Note (Signed)
Patient is stable post discharge and has no new issues or questions about discharge plans at the visit today for hospital follow up. All labs , imaging studies and progress notes from admission were reviewed with patient today   

## 2016-09-14 ENCOUNTER — Other Ambulatory Visit: Payer: Self-pay | Admitting: Internal Medicine

## 2016-09-14 ENCOUNTER — Telehealth: Payer: Self-pay | Admitting: Internal Medicine

## 2016-09-14 DIAGNOSIS — E1121 Type 2 diabetes mellitus with diabetic nephropathy: Secondary | ICD-10-CM

## 2016-09-14 NOTE — Telephone Encounter (Signed)
Left pt message asking to call Allison back directly at [phone] to schedule AWV. Thanks! °

## 2016-09-14 NOTE — Assessment & Plan Note (Signed)
she has had several episodes of fecal incontinence in mid afternoon. Advised her to stop the metformin NOT THE Plavix and Lipitor. .  Will start Januvia once we reassess

## 2016-09-20 DIAGNOSIS — H2513 Age-related nuclear cataract, bilateral: Secondary | ICD-10-CM | POA: Diagnosis not present

## 2016-09-25 ENCOUNTER — Other Ambulatory Visit: Payer: Self-pay | Admitting: Internal Medicine

## 2016-09-26 ENCOUNTER — Ambulatory Visit (INDEPENDENT_AMBULATORY_CARE_PROVIDER_SITE_OTHER): Payer: Medicare Other | Admitting: Cardiovascular Disease

## 2016-09-26 ENCOUNTER — Encounter: Payer: Self-pay | Admitting: Cardiovascular Disease

## 2016-09-26 VITALS — BP 140/60 | HR 83 | Ht 64.0 in | Wt 124.5 lb

## 2016-09-26 DIAGNOSIS — I10 Essential (primary) hypertension: Secondary | ICD-10-CM

## 2016-09-26 DIAGNOSIS — E784 Other hyperlipidemia: Secondary | ICD-10-CM | POA: Diagnosis not present

## 2016-09-26 DIAGNOSIS — I2510 Atherosclerotic heart disease of native coronary artery without angina pectoris: Secondary | ICD-10-CM | POA: Diagnosis not present

## 2016-09-26 DIAGNOSIS — I350 Nonrheumatic aortic (valve) stenosis: Secondary | ICD-10-CM

## 2016-09-26 DIAGNOSIS — E7849 Other hyperlipidemia: Secondary | ICD-10-CM

## 2016-09-26 NOTE — Patient Instructions (Addendum)
Medication Instructions:  Your physician recommends that you continue on your current medications as directed. Please refer to the Current Medication list given to you today.   Labwork: Lipid and liver profile in one month. Nothing to eat or drink after midnight the evening before your labs. Please take your lab orders to PCP appointment.   Testing/Procedures: none  Follow-Up: Your physician recommends that you schedule a follow-up appointment in: 4 months with Dr. Fletcher Anon.    Any Other Special Instructions Will Be Listed Below (If Applicable).     If you need a refill on your cardiac medications before your next appointment, please call your pharmacy.

## 2016-09-26 NOTE — Progress Notes (Signed)
Cardiology Office Note   Date:  09/26/2016   ID:  Justin, Buechner [birth year], MRN [id number]  PCP:  [name], MD  Cardiologist:   [name], MD   Chief Complaint  Patient presents with  . other    Patient was in the hospital 08/30/2016. Patient c/o right side chest pain.  Meds reviewed verbally with patient.       History of Present Illness: Maureen Morales is a 83 y.o. female who presents for a follow up regarding aortic stenosis and recent hospitalization for non-ST elevation myocardial infarction. She has chronic medical conditions that include hypertension, hyperlipidemia and type 2 diabetes.  She was hospitalized early this month with non-ST elevation myocardial infarction in the setting of uncontrolled hypertension. Troponin peaked at 5.79. Echocardiogram showed hyperdynamic LV systolic function, mild aortic stenosis with mean gradient of 14 mmHg, mild mitral regurgitation, mild pulmonary hypertension and trivial posterior pericardial effusion. I performed cardiac catheterization which showed mild to moderate nonobstructive coronary artery disease. Worst stenosis was 60% in the midright coronary artery. She was placed on Plavix and atorvastatin. Blood pressure medications were adjusted. Outpatient renal artery duplex showed no evidence of renal artery stenosis. Some of her uncontrolled hypertension on presentation was attributed to stress and anxiety. She has been doing reasonably well and reports that blood pressure has improved significantly. She does have some hearing loss which probably contributes to increased irritability but she has been resistant to considering a hearing aid. She also does not want medications for anxiety.  Past Medical History:  Diagnosis Date  . Diabetes mellitus   . Hyperlipidemia   . Hypertension   . Viral gastroenteritis due to [place]-like agent Nov 2012    Past Surgical History:  Procedure Laterality Date  . ABDOMINAL  HYSTERECTOMY    . BLADDER SURGERY    . CARDIAC CATHETERIZATION    . CHOLECYSTECTOMY    . LEFT HEART CATH AND CORONARY ANGIOGRAPHY N/A 08/31/2016   Procedure: Left Heart Cath and Coronary Angiography;  Surgeon: Wellington Hampshire, MD;  Location: [place] [facility];  Service: Cardiovascular;  Laterality: N/A;     Current Outpatient Prescriptions  Medication Sig Dispense Refill  . amLODipine (NORVASC) 5 MG tablet TAKE 1 TABLET BY MOUTH DAILY 30 tablet 3  . atorvastatin (LIPITOR) 40 MG tablet TAKE 1 TABLET BY MOUTH DAILY AT 6PM 30 tablet 3  . BD MICROTAINER LANCETS MISC Contact  Activated lancet 1.8 mm x 21 g Use once daily to check blood sugars 100 each 1  . Calcium Carb-Cholecalciferol (CALCIUM 600 + D PO) Take by mouth daily.    . calcium carbonate (TUMS) 500 MG chewable tablet Chew 1 tablet by mouth. Taken when needed    . clopidogrel (PLAVIX) 75 MG tablet Take 1 tablet (75 mg total) by mouth daily with breakfast. 30 tablet 0  . fenofibrate 160 MG tablet TAKE 1 TABLET BY MOUTH DAILY FOR HIGH TRIGLYCERIDES 30 tablet 3  . glucose blood (BAYER CONTOUR TEST) test strip Use as instructed 100 each 5  . loratadine (CLARITIN) 10 MG tablet Take 10 mg by mouth daily.    . metFORMIN (GLUCOPHAGE-XR) 750 MG 24 hr tablet Take 750 mg by mouth daily with breakfast.    . metoprolol succinate (TOPROL-XL) 50 MG 24 hr tablet TAKE 1 TABLET BY MOUTH DAILY 30 tablet 3  . Multiple Vitamins-Minerals (ABC PLUS SENIOR PO) Take by mouth.      . mupirocin ointment (BACTROBAN) 2 %  Apply 1 application topically 2 (two) times daily. To left earlobe 22 g 0  . nitroGLYCERIN (NITROSTAT) 0.4 MG SL tablet Place 1 tablet (0.4 mg total) under the tongue every 5 (five) minutes as needed for chest pain. 10 tablet 0  . valsartan (DIOVAN) 320 MG tablet TAKE 1 TABLET BY MOUTH DAILY 30 tablet 3   No current facility-administered medications for this visit.     Allergies:   Aspirin; Atropine; Belladonna alkaloids; Codeine; Darvon;  Methocarbamol; Penicillins; Sulfa antibiotics; and Iron    Social History:  The patient  reports that she has never smoked. She has never used smokeless tobacco. She reports that she does not drink alcohol or use drugs.   Family History:  The patient's family history includes Breast cancer (age of onset: 14) in her sister; Breast cancer (age of onset: 47) in her mother; Cancer in her mother.    ROS:  Please see the history of present illness.   Otherwise, review of systems are positive for none.   All other systems are reviewed and negative.    PHYSICAL EXAM: VS:  BP 140/60 (BP Location: Left Arm, Patient Position: Sitting, Cuff Size: Normal)   Pulse 83   Ht 5\' 4"  (1.626 m)   Wt 124 lb 8 oz (56.5 kg)   BMI 21.37 kg/m  , BMI Body mass index is 21.37 kg/m. GEN: Well nourished, well developed, in no acute distress  HEENT: normal  Neck: no JVD, carotid bruits, or masses Cardiac: RRR; no rubs, or gallops,no edema . 3/6 crescendo decrescendo systolic murmur in the aortic area which is mid peaking Respiratory:  clear to auscultation bilaterally, normal work of breathing GI: soft, nontender, nondistended, + BS MS: no deformity or atrophy  Skin: warm and dry, no rash Neuro:  Strength and sensation are intact Psych: euthymic mood, full affect Right radial pulse is normal with no hematoma.   EKG:  EKG is ordered today. The ekg ordered today demonstrates normal sinus rhythm with nonspecific ST changes.   Recent Labs: 08/30/2016: ALT 25; B Natriuretic Peptide 203.0 09/07/2016: BUN 41; Creatinine, Ser 1.20; Hemoglobin 10.6; Platelets 329.0; Potassium 4.0; Sodium 138    Lipid Panel    Component Value Date/Time   CHOL 150 11/29/2015 0806   TRIG 102.0 11/29/2015 0806   HDL 56.10 11/29/2015 0806   CHOLHDL 3 11/29/2015 0806   VLDL 20.4 11/29/2015 0806   LDLCALC 74 11/29/2015 0806   LDLDIRECT 92.0 07/06/2016 1059      Wt Readings from Last 3 Encounters:  09/26/16 124 lb 8 oz (56.5 kg)   09/07/16 123 lb 6 oz (56 kg)  08/31/16 124 lb (56.2 kg)       No flowsheet data found.    ASSESSMENT AND PLAN:  1. Coronary artery disease involving native coronary arteries without angina: Recent cardiac catheterization showed mild to moderate nonobstructive coronary artery disease. Non-ST elevation myocardial infarction was felt to be due to supply demand ischemia in the setting of uncontrolled hypertension. Continue Plavix given that she is intolerant to aspirin. Continue blood pressure control. She is not interested in attending cardiac rehabilitation.  2. Mild aortic valve stenosis: This has been stable on echocardiogram. Repeat in one year.  3. Essential hypertension: Blood pressure  improved significantly.  4. Hyperlipidemia: Continue treatment with atorvastatin. I requested lipid and liver profile to be done in one month.  5. Anxiety: We discussed ways of coping with her current situation. She does not want any medications for this. She  is open to trying a hearing aid which might improve her stability.    Disposition:   FU with me in 1 year  Signed,  [name], MD  09/26/2016 2:41 PM    [place]

## 2016-09-27 ENCOUNTER — Other Ambulatory Visit: Payer: Self-pay

## 2016-09-27 ENCOUNTER — Other Ambulatory Visit: Payer: Self-pay | Admitting: Internal Medicine

## 2016-09-27 MED ORDER — SERTRALINE HCL 25 MG PO TABS: 25.0000 mg | ORAL_TABLET | Freq: Every day | ORAL | Status: DC

## 2016-09-27 MED ORDER — SERTRALINE HCL 25 MG PO TABS: 25.0000 mg | ORAL_TABLET | Freq: Every day | ORAL | 0 refills | Status: DC

## 2016-10-04 DIAGNOSIS — H6123 Impacted cerumen, bilateral: Secondary | ICD-10-CM | POA: Diagnosis not present

## 2016-10-04 DIAGNOSIS — H903 Sensorineural hearing loss, bilateral: Secondary | ICD-10-CM | POA: Diagnosis not present

## 2016-10-11 ENCOUNTER — Other Ambulatory Visit: Payer: Self-pay | Admitting: Internal Medicine

## 2016-10-11 ENCOUNTER — Telehealth: Payer: Self-pay | Admitting: *Deleted

## 2016-10-11 DIAGNOSIS — R319 Hematuria, unspecified: Secondary | ICD-10-CM

## 2016-10-11 NOTE — Telephone Encounter (Signed)
Those are all she needs

## 2016-10-11 NOTE — Telephone Encounter (Signed)
Those are from Dr. Fletcher Anon.

## 2016-10-11 NOTE — Telephone Encounter (Signed)
Pt has lab appt on 10/12/16. Please place future orders.  Thanks

## 2016-10-11 NOTE — Telephone Encounter (Signed)
She already has future labs orders from may 29

## 2016-10-12 ENCOUNTER — Other Ambulatory Visit (INDEPENDENT_AMBULATORY_CARE_PROVIDER_SITE_OTHER): Payer: Medicare Other

## 2016-10-12 DIAGNOSIS — E7849 Other hyperlipidemia: Secondary | ICD-10-CM

## 2016-10-12 DIAGNOSIS — R319 Hematuria, unspecified: Secondary | ICD-10-CM

## 2016-10-12 DIAGNOSIS — E784 Other hyperlipidemia: Secondary | ICD-10-CM | POA: Diagnosis not present

## 2016-10-12 LAB — URINALYSIS, ROUTINE W REFLEX MICROSCOPIC
Bilirubin Urine: NEGATIVE
KETONES UR: NEGATIVE
Nitrite: NEGATIVE
PH: 5.5 (ref 5.0–8.0)
RBC / HPF: NONE SEEN (ref 0–?)
SPECIFIC GRAVITY, URINE: 1.02 (ref 1.000–1.030)
Total Protein, Urine: 30 — AB
Urine Glucose: NEGATIVE
Urobilinogen, UA: 0.2 (ref 0.0–1.0)

## 2016-10-12 LAB — LIPID PANEL
CHOL/HDL RATIO: 2.7 ratio (ref <5.0)
CHOLESTEROL: 115 mg/dL (ref <200)
HDL: 43 mg/dL — ABNORMAL LOW (ref >50)
LDL CALC: 50 mg/dL (ref <100)
Triglycerides: 108 mg/dL (ref <150)
VLDL: 22 mg/dL (ref <30)

## 2016-10-12 LAB — HEPATIC FUNCTION PANEL
ALK PHOS: 51 U/L (ref 33–130)
ALT: 17 U/L (ref 6–29)
AST: 17 U/L (ref 10–35)
Albumin: 4.1 g/dL (ref 3.6–5.1)
BILIRUBIN INDIRECT: 0.3 mg/dL (ref 0.2–1.2)
BILIRUBIN TOTAL: 0.4 mg/dL (ref 0.2–1.2)
Bilirubin, Direct: 0.1 mg/dL (ref <=0.2)
Total Protein: 6.7 g/dL (ref 6.1–8.1)

## 2016-10-12 NOTE — Addendum Note (Signed)
Addended by: Leeanne Rio on: 10/12/2016 08:44 AM   Modules accepted: Orders

## 2016-10-12 NOTE — Addendum Note (Signed)
Addended by: Leeanne Rio on: 10/12/2016 08:45 AM   Modules accepted: Orders

## 2016-10-13 LAB — URINE CULTURE: Organism ID, Bacteria: NO GROWTH

## 2016-10-16 DIAGNOSIS — H5712 Ocular pain, left eye: Secondary | ICD-10-CM | POA: Diagnosis not present

## 2016-10-18 ENCOUNTER — Other Ambulatory Visit: Payer: Self-pay | Admitting: Internal Medicine

## 2016-10-23 NOTE — Telephone Encounter (Signed)
Left pt message asking to call Allison back directly at [phone] to schedule AWV. Thanks! °

## 2016-11-01 ENCOUNTER — Other Ambulatory Visit: Payer: Self-pay | Admitting: Internal Medicine

## 2016-11-01 ENCOUNTER — Emergency Department: Payer: Medicare Other

## 2016-11-01 ENCOUNTER — Observation Stay
Admission: EM | Admit: 2016-11-01 | Discharge: 2016-11-03 | Disposition: A | Discharge status: Home / Self Care | Payer: Medicare Other | Attending: Internal Medicine | Admitting: Internal Medicine

## 2016-11-01 DIAGNOSIS — I2584 Coronary atherosclerosis due to calcified coronary lesion: Principal | ICD-10-CM

## 2016-11-01 DIAGNOSIS — K5730 Diverticulosis of large intestine without perforation or abscess without bleeding: Secondary | ICD-10-CM | POA: Diagnosis not present

## 2016-11-01 DIAGNOSIS — A080 Rotaviral enteritis: Principal | ICD-10-CM | POA: Insufficient documentation

## 2016-11-01 DIAGNOSIS — A084 Viral intestinal infection, unspecified: Secondary | ICD-10-CM | POA: Diagnosis not present

## 2016-11-01 DIAGNOSIS — E876 Hypokalemia: Secondary | ICD-10-CM | POA: Insufficient documentation

## 2016-11-01 DIAGNOSIS — E860 Dehydration: Secondary | ICD-10-CM | POA: Diagnosis not present

## 2016-11-01 DIAGNOSIS — E785 Hyperlipidemia, unspecified: Secondary | ICD-10-CM | POA: Diagnosis not present

## 2016-11-01 DIAGNOSIS — I951 Orthostatic hypotension: Secondary | ICD-10-CM | POA: Diagnosis not present

## 2016-11-01 DIAGNOSIS — R262 Difficulty in walking, not elsewhere classified: Secondary | ICD-10-CM | POA: Diagnosis not present

## 2016-11-01 DIAGNOSIS — C20 Malignant neoplasm of rectum: Secondary | ICD-10-CM | POA: Diagnosis not present

## 2016-11-01 DIAGNOSIS — K529 Noninfective gastroenteritis and colitis, unspecified: Secondary | ICD-10-CM | POA: Diagnosis present

## 2016-11-01 DIAGNOSIS — Z66 Do not resuscitate: Secondary | ICD-10-CM | POA: Diagnosis not present

## 2016-11-01 DIAGNOSIS — Z79899 Other long term (current) drug therapy: Secondary | ICD-10-CM | POA: Diagnosis not present

## 2016-11-01 DIAGNOSIS — Z7902 Long term (current) use of antithrombotics/antiplatelets: Secondary | ICD-10-CM | POA: Diagnosis not present

## 2016-11-01 DIAGNOSIS — T17908A Unspecified foreign body in respiratory tract, part unspecified causing other injury, initial encounter: Secondary | ICD-10-CM

## 2016-11-01 DIAGNOSIS — I2510 Atherosclerotic heart disease of native coronary artery without angina pectoris: Secondary | ICD-10-CM | POA: Insufficient documentation

## 2016-11-01 DIAGNOSIS — I252 Old myocardial infarction: Secondary | ICD-10-CM | POA: Insufficient documentation

## 2016-11-01 DIAGNOSIS — M6281 Muscle weakness (generalized): Secondary | ICD-10-CM | POA: Diagnosis not present

## 2016-11-01 DIAGNOSIS — Z7984 Long term (current) use of oral hypoglycemic drugs: Secondary | ICD-10-CM | POA: Insufficient documentation

## 2016-11-01 DIAGNOSIS — R933 Abnormal findings on diagnostic imaging of other parts of digestive tract: Secondary | ICD-10-CM

## 2016-11-01 DIAGNOSIS — J9601 Acute respiratory failure with hypoxia: Secondary | ICD-10-CM | POA: Insufficient documentation

## 2016-11-01 DIAGNOSIS — Z880 Allergy status to penicillin: Secondary | ICD-10-CM | POA: Diagnosis not present

## 2016-11-01 DIAGNOSIS — R197 Diarrhea, unspecified: Secondary | ICD-10-CM | POA: Diagnosis not present

## 2016-11-01 DIAGNOSIS — R55 Syncope and collapse: Secondary | ICD-10-CM | POA: Diagnosis not present

## 2016-11-01 DIAGNOSIS — R112 Nausea with vomiting, unspecified: Secondary | ICD-10-CM

## 2016-11-01 DIAGNOSIS — R1110 Vomiting, unspecified: Secondary | ICD-10-CM | POA: Diagnosis not present

## 2016-11-01 DIAGNOSIS — E119 Type 2 diabetes mellitus without complications: Secondary | ICD-10-CM | POA: Diagnosis not present

## 2016-11-01 HISTORY — DX: Atherosclerotic heart disease of native coronary artery without angina pectoris: I25.10

## 2016-11-01 LAB — COMPREHENSIVE METABOLIC PANEL
ALBUMIN: 4.3 g/dL (ref 3.5–5.0)
ALT: 31 U/L (ref 14–54)
AST: 35 U/L (ref 15–41)
Alkaline Phosphatase: 56 U/L (ref 38–126)
Anion gap: 10 (ref 5–15)
BUN: 40 mg/dL — ABNORMAL HIGH (ref 6–20)
CHLORIDE: 103 mmol/L (ref 101–111)
CO2: 24 mmol/L (ref 22–32)
CREATININE: 0.94 mg/dL (ref 0.44–1.00)
Calcium: 9.6 mg/dL (ref 8.9–10.3)
GFR calc non Af Amer: 51 mL/min — ABNORMAL LOW (ref >60)
GFR, EST AFRICAN AMERICAN: 60 mL/min — AB (ref >60)
GLUCOSE: 292 mg/dL — AB (ref 65–99)
Potassium: 4 mmol/L (ref 3.5–5.1)
SODIUM: 137 mmol/L (ref 135–145)
Total Bilirubin: 0.7 mg/dL (ref 0.3–1.2)
Total Protein: 7.6 g/dL (ref 6.5–8.1)

## 2016-11-01 LAB — GLUCOSE, CAPILLARY
GLUCOSE-CAPILLARY: 226 mg/dL — AB (ref 65–99)
GLUCOSE-CAPILLARY: 147 mg/dL — AB (ref 65–99)
Glucose-Capillary: 246 mg/dL — ABNORMAL HIGH (ref 65–99)
Glucose-Capillary: 156 mg/dL — ABNORMAL HIGH (ref 65–99)

## 2016-11-01 LAB — GASTROINTESTINAL PANEL BY PCR, STOOL (REPLACES STOOL CULTURE)
ADENOVIRUS F40/41: NOT DETECTED
ASTROVIRUS: NOT DETECTED
Campylobacter species: NOT DETECTED
Cryptosporidium: NOT DETECTED
Cyclospora cayetanensis: NOT DETECTED
ENTEROAGGREGATIVE E COLI (EAEC): NOT DETECTED
ENTEROPATHOGENIC E COLI (EPEC): NOT DETECTED
Entamoeba histolytica: NOT DETECTED
Enterotoxigenic E coli (ETEC): NOT DETECTED
GIARDIA LAMBLIA: NOT DETECTED
NOROVIRUS GI/GII: NOT DETECTED
Plesimonas shigelloides: NOT DETECTED
ROTAVIRUS A: DETECTED — AB
Salmonella species: NOT DETECTED
Sapovirus (I, II, IV, and V): NOT DETECTED
Shiga like toxin producing E coli (STEC): NOT DETECTED
Shigella/Enteroinvasive E coli (EIEC): NOT DETECTED
Vibrio cholerae: NOT DETECTED
Vibrio species: NOT DETECTED
YERSINIA ENTEROCOLITICA: NOT DETECTED

## 2016-11-01 LAB — URINALYSIS, COMPLETE (UACMP) WITH MICROSCOPIC
Bilirubin Urine: NEGATIVE
Glucose, UA: >=500 mg/dL — AB
Hgb urine dipstick: NEGATIVE
KETONES UR: NEGATIVE mg/dL
Nitrite: NEGATIVE
PROTEIN: 100 mg/dL — AB
Specific Gravity, Urine: 1.016 (ref 1.005–1.030)
pH: 5 (ref 5.0–8.0)

## 2016-11-01 LAB — CBC
HCT: 34.2 % — ABNORMAL LOW (ref 35.0–47.0)
HEMOGLOBIN: 11.7 g/dL — AB (ref 12.0–16.0)
MCH: 28.9 pg (ref 26.0–34.0)
MCHC: 34.2 g/dL (ref 32.0–36.0)
MCV: 84.3 fL (ref 80.0–100.0)
Platelets: 273 10*3/uL (ref 150–440)
RBC: 4.06 MIL/uL (ref 3.80–5.20)
RDW: 13.9 % (ref 11.5–14.5)
WBC: 14.3 10*3/uL — ABNORMAL HIGH (ref 3.6–11.0)

## 2016-11-01 LAB — C DIFFICILE QUICK SCREEN W PCR REFLEX
C Diff antigen: NEGATIVE
C Diff interpretation: NOT DETECTED
C Diff toxin: NEGATIVE

## 2016-11-01 LAB — TROPONIN I

## 2016-11-01 LAB — MAGNESIUM: MAGNESIUM: 1.8 mg/dL (ref 1.7–2.4)

## 2016-11-01 LAB — CK: CK TOTAL: 89 U/L (ref 38–234)

## 2016-11-01 LAB — LIPASE, BLOOD: LIPASE: 43 U/L (ref 11–51)

## 2016-11-01 LAB — LACTIC ACID, PLASMA: LACTIC ACID, VENOUS: 1.7 mmol/L (ref 0.5–1.9)

## 2016-11-01 MED ORDER — NITROGLYCERIN 0.4 MG SL SUBL: 0.4000 mg | SUBLINGUAL_TABLET | SUBLINGUAL | Status: DC | PRN

## 2016-11-01 MED ORDER — CIPROFLOXACIN HCL 500 MG PO TABS: 500.0000 mg | ORAL_TABLET | Freq: Two times a day (BID) | ORAL | 0 refills | Status: DC

## 2016-11-01 MED ORDER — FENOFIBRATE 54 MG PO TABS
54.0000 mg | ORAL_TABLET | Freq: Every day | ORAL | Status: DC
  Administered 2016-11-02 – 2016-11-03 (×2): 54 mg via ORAL
  Filled 2016-11-01 (×2): qty 1

## 2016-11-01 MED ORDER — INSULIN ASPART 100 UNIT/ML ~~LOC~~ SOLN
SUBCUTANEOUS | Status: AC
  Administered 2016-11-01: 3 [IU] via SUBCUTANEOUS
  Filled 2016-11-01: qty 1

## 2016-11-01 MED ORDER — METOPROLOL SUCCINATE ER 50 MG PO TB24
50.0000 mg | ORAL_TABLET | Freq: Every day | ORAL | Status: DC
  Administered 2016-11-02 – 2016-11-03 (×2): 50 mg via ORAL
  Filled 2016-11-01 (×2): qty 1

## 2016-11-01 MED ORDER — ONDANSETRON HCL 4 MG/2ML IJ SOLN
4.0000 mg | Freq: Once | INTRAMUSCULAR | Status: AC
  Administered 2016-11-01: 4 mg via INTRAVENOUS
  Filled 2016-11-01: qty 2

## 2016-11-01 MED ORDER — IOPAMIDOL (ISOVUE-300) INJECTION 61%
75.0000 mL | Freq: Once | INTRAVENOUS | Status: AC | PRN
  Administered 2016-11-01: 75 mL via INTRAVENOUS

## 2016-11-01 MED ORDER — SACCHAROMYCES BOULARDII 250 MG PO CAPS
250.0000 mg | ORAL_CAPSULE | Freq: Two times a day (BID) | ORAL | Status: DC
  Administered 2016-11-01 – 2016-11-03 (×4): 250 mg via ORAL
  Filled 2016-11-01 (×4): qty 1

## 2016-11-01 MED ORDER — ATORVASTATIN CALCIUM 20 MG PO TABS
20.0000 mg | ORAL_TABLET | Freq: Every day | ORAL | Status: DC
  Administered 2016-11-02: 20 mg via ORAL
  Filled 2016-11-01: qty 1

## 2016-11-01 MED ORDER — ONDANSETRON HCL 4 MG/2ML IJ SOLN: 4.0000 mg | Freq: Four times a day (QID) | INTRAMUSCULAR | Status: DC | PRN

## 2016-11-01 MED ORDER — INSULIN ASPART 100 UNIT/ML ~~LOC~~ SOLN
0.0000 [IU] | Freq: Three times a day (TID) | SUBCUTANEOUS | Status: DC
  Administered 2016-11-01: 3 [IU] via SUBCUTANEOUS
  Administered 2016-11-02 – 2016-11-03 (×2): 2 [IU] via SUBCUTANEOUS
  Filled 2016-11-01 (×2): qty 1

## 2016-11-01 MED ORDER — ACETAMINOPHEN 650 MG RE SUPP: 650.0000 mg | Freq: Four times a day (QID) | RECTAL | Status: DC | PRN

## 2016-11-01 MED ORDER — AMLODIPINE BESYLATE 5 MG PO TABS
5.0000 mg | ORAL_TABLET | Freq: Every day | ORAL | Status: DC
  Administered 2016-11-01 – 2016-11-03 (×3): 5 mg via ORAL
  Filled 2016-11-01 (×3): qty 1

## 2016-11-01 MED ORDER — SODIUM CHLORIDE 0.9 % IV BOLUS (SEPSIS)
1000.0000 mL | Freq: Once | INTRAVENOUS | Status: AC
  Administered 2016-11-01: 1000 mL via INTRAVENOUS

## 2016-11-01 MED ORDER — ENOXAPARIN SODIUM 40 MG/0.4ML ~~LOC~~ SOLN
40.0000 mg | SUBCUTANEOUS | Status: DC
  Administered 2016-11-01: 40 mg via SUBCUTANEOUS
  Filled 2016-11-01: qty 0.4

## 2016-11-01 MED ORDER — SERTRALINE HCL 50 MG PO TABS
25.0000 mg | ORAL_TABLET | Freq: Every day | ORAL | Status: DC
  Administered 2016-11-02: 25 mg via ORAL
  Filled 2016-11-01: qty 1

## 2016-11-01 MED ORDER — LORATADINE 10 MG PO TABS
10.0000 mg | ORAL_TABLET | Freq: Every day | ORAL | Status: DC
  Administered 2016-11-02 – 2016-11-03 (×2): 10 mg via ORAL
  Filled 2016-11-01 (×2): qty 1

## 2016-11-01 MED ORDER — POTASSIUM CHLORIDE IN NACL 20-0.9 MEQ/L-% IV SOLN
INTRAVENOUS | Status: AC
  Administered 2016-11-01 – 2016-11-02 (×2): via INTRAVENOUS
  Filled 2016-11-01 (×2): qty 1000

## 2016-11-01 MED ORDER — ONDANSETRON HCL 4 MG PO TABS: 4.0000 mg | ORAL_TABLET | Freq: Four times a day (QID) | ORAL | Status: DC | PRN

## 2016-11-01 MED ORDER — ACETAMINOPHEN 325 MG PO TABS: 650.0000 mg | ORAL_TABLET | Freq: Four times a day (QID) | ORAL | Status: DC | PRN

## 2016-11-01 MED ORDER — CLOPIDOGREL BISULFATE 75 MG PO TABS
75.0000 mg | ORAL_TABLET | Freq: Every day | ORAL | Status: DC
  Administered 2016-11-02 – 2016-11-03 (×2): 75 mg via ORAL
  Filled 2016-11-01 (×2): qty 1

## 2016-11-01 MED ORDER — IRBESARTAN 75 MG PO TABS
37.5000 mg | ORAL_TABLET | Freq: Every day | ORAL | Status: DC
  Administered 2016-11-02 – 2016-11-03 (×2): 37.5 mg via ORAL
  Filled 2016-11-01 (×3): qty 0.5

## 2016-11-01 MED ORDER — ONDANSETRON 4 MG PO TBDP: 4.0000 mg | ORAL_TABLET | Freq: Three times a day (TID) | ORAL | 0 refills | Status: DC | PRN

## 2016-11-01 MED ORDER — ALBUTEROL SULFATE (2.5 MG/3ML) 0.083% IN NEBU: 2.5000 mg | INHALATION_SOLUTION | RESPIRATORY_TRACT | Status: DC | PRN

## 2016-11-01 MED ORDER — CLINDAMYCIN PHOSPHATE 300 MG/50ML IV SOLN
300.0000 mg | Freq: Three times a day (TID) | INTRAVENOUS | Status: DC
  Administered 2016-11-01 – 2016-11-02 (×3): 300 mg via INTRAVENOUS
  Filled 2016-11-01 (×4): qty 50

## 2016-11-01 MED ORDER — CIPROFLOXACIN HCL 500 MG PO TABS
500.0000 mg | ORAL_TABLET | Freq: Two times a day (BID) | ORAL | Status: DC
  Administered 2016-11-01 – 2016-11-02 (×3): 500 mg via ORAL
  Filled 2016-11-01 (×3): qty 1

## 2016-11-01 NOTE — H&P (Signed)
[facility] NAME: Maureen Morales    MR#:  [id number]  DATE OF BIRTH:  03/23/1923  DATE OF ADMISSION:  11/01/2016  PRIMARY CARE PHYSICIAN: [name], MD   REQUESTING/REFERRING PHYSICIAN: Dr. Mable Paris  CHIEF COMPLAINT:   Chief Complaint  Patient presents with  . Nausea  . Emesis    HISTORY OF PRESENT ILLNESS:  Maureen Morales  is a 83 y.o. female with a known history of Hypertension, diabetes, hyperlipidemia, recent non-ST elevation MI presents to the emergency room complaining of multiple episodes of watery diarrhea since yesterday night. Patient lives with her 76 year old husband. She has been going to the restroom continuously and cleaning up. She felt extremely weak, dizzy and presented to the emergency room. Here patient was hydrated and started on ciprofloxacin due to history of daughter having a recent salmonella diarrhea. Discharged from ED but when patient sat in the wheelchair she got extremely dizzy and had an episode of syncopal episode. She has also had multiple rounds of vomiting. Abdominal cramping but no significant pain. No blood in stool or vomiting. No recent antibiotic use. Her husband has also had milder symptoms. CT scan of the abdomen and pelvis showed some left rectal wall thickening or a mass concerning for possible malignancy. Patient's saturations are down to 88% on room air. She has developed a cough since today morning. Chest x-ray is clear. No dysuria.  Patient's primary care physician Dr. Derrel Nip who is also a close family friend was in the room with the patient.  PAST MEDICAL HISTORY:   Past Medical History:  Diagnosis Date  . CAD (coronary artery disease)   . Diabetes mellitus   . Hyperlipidemia   . Hypertension   . Viral gastroenteritis due to [place]-like agent Nov 2012    PAST SURGICAL HISTORY:   Past Surgical History:  Procedure Laterality Date  . ABDOMINAL HYSTERECTOMY    . BLADDER SURGERY     . CARDIAC CATHETERIZATION    . CHOLECYSTECTOMY    . LEFT HEART CATH AND CORONARY ANGIOGRAPHY N/A 08/31/2016   Procedure: Left Heart Cath and Coronary Angiography;  Surgeon: Wellington Hampshire, MD;  Location: [facility];  Service: Cardiovascular;  Laterality: N/A;    SOCIAL HISTORY:   Social History  Substance Use Topics  . Smoking status: Never Smoker  . Smokeless tobacco: Never Used  . Alcohol use No    FAMILY HISTORY:   Family History  Problem Relation Age of Onset  . Cancer Mother   . Breast cancer Mother 52  . Breast cancer Sister 5    DRUG ALLERGIES:   Allergies  Allergen Reactions  . Aspirin   . Atropine   . Belladonna Alkaloids   . Codeine   . Darvon   . Methocarbamol   . Penicillins   . Sulfa Antibiotics   . Iron Other (See Comments)    High dose prescription gives her diarrhea    REVIEW OF SYSTEMS:   Review of Systems  Constitutional: Positive for malaise/fatigue. Negative for chills and fever.  HENT: Negative for sore throat.   Eyes: Negative for blurred vision, double vision and pain.  Respiratory: Negative for cough, hemoptysis, shortness of breath and wheezing.   Cardiovascular: Negative for chest pain, palpitations, orthopnea and leg swelling.  Gastrointestinal: Positive for diarrhea, nausea and vomiting. Negative for abdominal pain, constipation and heartburn.  Genitourinary: Negative for dysuria and hematuria.  Musculoskeletal: Negative for back pain and joint pain.  Skin: Negative for rash.  Neurological: Positive for weakness. Negative for sensory change, speech change, focal weakness and headaches.  Endo/Heme/Allergies: Does not bruise/bleed easily.  Psychiatric/Behavioral: Negative for depression. The patient is not nervous/anxious.     MEDICATIONS AT HOME:   Prior to Admission medications   Medication Sig Start Date End Date Taking? Authorizing Provider  amLODipine (NORVASC) 5 MG tablet TAKE 1 TABLET BY MOUTH DAILY 09/26/16    [name], MD  atorvastatin (LIPITOR) 40 MG tablet TAKE 1 TABLET BY MOUTH DAILY AT 6PM 09/26/16   [name], MD  BD MICROTAINER LANCETS MISC Contact  Activated lancet 1.8 mm x 21 g Use once daily to check blood sugars 07/10/16   [name], MD  Calcium Carb-Cholecalciferol (CALCIUM 600 + D PO) Take by mouth daily.    [provider]  calcium carbonate (TUMS) 500 MG chewable tablet Chew 1 tablet by mouth. Taken when needed    [provider]  ciprofloxacin (CIPRO) 500 MG tablet Take 1 tablet (500 mg total) by mouth 2 (two) times daily. 11/01/16 11/04/16  Darel Hong, MD  ciprofloxacin (CIPRO) 500 MG tablet Take 1 tablet (500 mg total) by mouth 2 (two) times daily. 11/01/16   [name], MD  clopidogrel (PLAVIX) 75 MG tablet Take 1 tablet (75 mg total) by mouth daily with breakfast. 09/02/16   Gouru, Illene Silver, MD  fenofibrate 160 MG tablet TAKE 1 TABLET BY MOUTH DAILY FOR HIGH TRIGLYCERIDES 06/28/16   [name], MD  glucose blood (BAYER CONTOUR TEST) test strip Use as instructed 02/28/13   [name], MD  loratadine (CLARITIN) 10 MG tablet Take 10 mg by mouth daily.    [provider]  metFORMIN (GLUCOPHAGE-XR) 750 MG 24 hr tablet Take 750 mg by mouth daily with breakfast.    [provider]  metFORMIN (GLUCOPHAGE-XR) 750 MG 24 hr tablet TAKE 1 TABLET BY MOUTH DAILY WITH BREAKFAST 10/18/16   [name], MD  metoprolol succinate (TOPROL-XL) 50 MG 24 hr tablet TAKE 1 TABLET BY MOUTH DAILY 09/26/16   [name], MD  Multiple Vitamins-Minerals (ABC PLUS SENIOR PO) Take by mouth.      [provider]  mupirocin ointment (BACTROBAN) 2 % Apply 1 application topically 2 (two) times daily. To left earlobe 08/28/16   [name], MD  nitroGLYCERIN (NITROSTAT) 0.4 MG SL tablet Place 1 tablet (0.4 mg total) under the tongue every 5 (five) minutes as needed for chest pain. 09/02/16   Gouru, Illene Silver, MD  ondansetron (ZOFRAN ODT) 4 MG  disintegrating tablet Take 1 tablet (4 mg total) by mouth every 8 (eight) hours as needed for nausea or vomiting. 11/01/16   Darel Hong, MD  ondansetron (ZOFRAN ODT) 4 MG disintegrating tablet Take 1 tablet (4 mg total) by mouth every 8 (eight) hours as needed for nausea or vomiting. 11/01/16   [name], MD  sertraline (ZOLOFT) 25 MG tablet Take 1 tablet (25 mg total) by mouth daily. AFTER DINNER 09/27/16   [name], MD  valsartan (DIOVAN) 320 MG tablet TAKE 1 TABLET BY MOUTH DAILY 06/28/16   [name], MD     VITAL SIGNS:  Blood pressure (!) 149/63, pulse 97, temperature 100.2 F (37.9 C), temperature source Rectal, resp. rate (!) 27, height 5\' 3"  (1.6 m), weight 56.2 kg (124 lb), SpO2 94 %.  PHYSICAL EXAMINATION:  Physical Exam  GENERAL:  83 y.o.-year-old patient lying in the bed  with no acute distress.  EYES: Pupils equal, round, reactive to light and accommodation. No scleral icterus. Extraocular muscles intact.  HEENT: Head atraumatic, normocephalic. Oropharynx and nasopharynx clear. No oropharyngeal erythema, dry oral mucosa  NECK:  Supple, no jugular venous distention. No thyroid enlargement, no tenderness.  LUNGS: Normal breath sounds bilaterally, no wheezing, rales, rhonchi. No use of accessory muscles of respiration.  CARDIOVASCULAR: S1, S2 normal. Systolic murmur ABDOMEN: Soft, nontender, nondistended. Bowel sounds present. No organomegaly or mass.  EXTREMITIES: No pedal edema, cyanosis, or clubbing. + 2 pedal & radial pulses b/l.   NEUROLOGIC: Cranial nerves II through XII are intact. No focal Motor or sensory deficits appreciated b/l PSYCHIATRIC: The patient is alert and oriented x 3. Good affect.  SKIN: No obvious rash, lesion, or ulcer.   LABORATORY PANEL:   CBC  Recent Labs Lab 11/01/16 0547  WBC 14.3*  HGB 11.7*  HCT 34.2*  PLT 273    ------------------------------------------------------------------------------------------------------------------  Chemistries   Recent Labs Lab 11/01/16 0547  NA 137  K 4.0  CL 103  CO2 24  GLUCOSE 292*  BUN 40*  CREATININE 0.94  CALCIUM 9.6  AST 35  ALT 31  ALKPHOS 56  BILITOT 0.7   ------------------------------------------------------------------------------------------------------------------  Cardiac Enzymes  Recent Labs Lab 11/01/16 0716  TROPONINI <0.03   ------------------------------------------------------------------------------------------------------------------  RADIOLOGY:  Ct Abdomen Pelvis W Contrast  Result Date: 11/01/2016 CLINICAL DATA:  Abdominal pain, vomiting EXAM: CT ABDOMEN AND PELVIS WITH CONTRAST TECHNIQUE: Multidetector CT imaging of the abdomen and pelvis was performed using the standard protocol following bolus administration of intravenous contrast. CONTRAST:  87mL ISOVUE-300 IOPAMIDOL (ISOVUE-300) INJECTION 61% COMPARISON:  12/08/2013 FINDINGS: Lower chest: Small hiatal hernia. Heart is borderline in size. Calcified mitral valve annular valve. Patchy ground-glass airspace opacities in both lower lobes, right greater than left. No effusions. Hepatobiliary: No focal liver abnormality is seen. Status post cholecystectomy. No biliary dilatation. Pancreas: No focal abnormality or ductal dilatation. Spleen: No focal abnormality.  Normal size. Adrenals/Urinary Tract: Adrenal glands unremarkable. Numerous bilateral renal cysts. No hydronephrosis. Urinary bladder unremarkable. Stomach/Bowel: Sigmoid diverticulosis. No active diverticulitis. Appendix is normal. Stomach and small bowel decompressed, unremarkable. There is suggestion of wall thickening/ enhancement along the left lateral rectal wall. Cannot exclude rectal mass (image 80). Recommend correlation with digital rectal exam and possibly colonoscopy. Vascular/Lymphatic: Aortic and iliac  calcifications. No aneurysm or adenopathy. Reproductive: Prior hysterectomy.  No adnexal masses. Other: No free fluid or free air. Musculoskeletal: Degenerative changes throughout the lumbar spine. No acute bony abnormality. IMPRESSION: Possible left rectal wall thickening/ mass. Cannot exclude rectal cancer. Recommend correlation with physical exam and possible colonoscopy (or prior recent colonoscopy). Sigmoid diverticulosis. Small hiatal hernia. Electronically Signed   By: Rolm Baptise M.D.   On: 11/01/2016 07:50   Dg Chest Port 1 View  Result Date: 11/01/2016 CLINICAL DATA:  Abdominal pain, vomiting. EXAM: PORTABLE CHEST 1 VIEW COMPARISON:  08/30/2016 FINDINGS: Mild hyperinflation. Heart and mediastinal contours are within normal limits. No focal opacities or effusions. No acute bony abnormality. IMPRESSION: Hyperinflation.  No active cardiopulmonary disease. Electronically Signed   By: Rolm Baptise M.D.   On: 11/01/2016 07:43     IMPRESSION AND PLAN:   * Acute gastroenteritis. Most likely viral. We'll check C. difficile and stool PCR. Start ciprofloxacin prophylactically due to daughter having Salmonella diarrhea recently. Soft diet. Start IV fluids. Patient will be admitted under observation. Check magnesium level  * Syncope due to orthostatic hypotension. Patient did get extremely dizzy on  standing up. We will lace on telemetry for 24 hours. No chest pain. Troponin normal. EKG shows nothing acute.  * Acute hypoxic respiratory failure likely due to aspiration. Could be aspiration pneumonitis Chest x-ray is clear. Will star  on clindamycin in addition to ciprofloxacin. Repeat chest x-ray in the morning. Wean oxygen as tolerated. Nebs when necessary. Incentive spirometer. Would stop antibiotics for aspiration pneumonia if patient improves by tomorrow.  * Hypertension. Continue home medications.  * Diabetes mellitus. Hold metformin. Start sliding scale insulin.  * CAD. Continue  patient's aspirin, Plavix and beta blocker, statin.  * DVT prophylaxis with Lovenox  All the records are reviewed and case discussed with ED provider. Management plans discussed with the patient, family and they are in agreement.  CODE STATUS: DNR  TOTAL TIME TAKING CARE OF THIS PATIENT: 40 minutes.   Hillary Bow R M.D on 11/01/2016 at 11:42 AM  Between 7am to 6pm - Pager - [phone]  After 6pm go to [email or web address] - password [email or web address] [facility]  Office  [phone]  CC: Primary care physician; [name], MD  Note: This dictation was prepared with Dragon dictation along with smaller phrase technology. Any transcriptional errors that result from this process are unintentional.

## 2016-11-01 NOTE — ED Notes (Signed)
Patient placed on bedpan by this RN and Stephani Police. Patient self cleaned and linens changed. Brief placed on patient

## 2016-11-01 NOTE — ED Notes (Signed)
Placed pt on bedpan and collected UA sample. Sent to lab. Pt extra bedding removed and pt moved up in bed. RN assisted.

## 2016-11-01 NOTE — ED Notes (Signed)
Patient transported to CT 

## 2016-11-01 NOTE — Progress Notes (Signed)
Call from [facility] patient had a pair of PVC and a run of Bigeminy. MD made aware. No new orders.

## 2016-11-01 NOTE — Progress Notes (Signed)
an

## 2016-11-01 NOTE — Progress Notes (Signed)
Patient's family is educated on isolation and purpose. Relayed results for positive rotavirus and how to prevent the spread of the virus. Including washing hands with soap and water, and wearing isolation gowns and gloves.

## 2016-11-01 NOTE — Progress Notes (Signed)
Went in to catch patient up on her daily meds, patient was resting. Patient's daughter stated that she needs her sleep. Gave patient PO ABX and Norvasc early this shift. Tolerated well, along with broth, water, and crackers. Held the rest of her daily meds per family request.

## 2016-11-01 NOTE — ED Notes (Signed)
Pt bib EMS from w/ c/o n/v/d that began this AM. Pt alert, oriented to self and situation. Pt denies pain and SOB, reports "not keeping anything down". Resp even and unlabored. Pt pale on arrival. EMS sts that she has not had episode of emesis since their arrival. Pt has hx of MI 6 weeks ago. Denies CP.

## 2016-11-01 NOTE — ED Notes (Signed)
Report given to Amber RN

## 2016-11-01 NOTE — ED Notes (Signed)
X-ray at bedside

## 2016-11-01 NOTE — ED Provider Notes (Signed)
[facility] Emergency Department Provider Note  ____________________________________________   First MD Initiated Contact with Patient 11/01/16 [phone]     (approximate)  I have reviewed the triage vital signs and the nursing notes.   HISTORY  Chief Complaint Nausea and Emesis   HPI Maureen Morales is a 83 y.o. female who comes to the emergency department via EMS with sudden onset nausea vomiting and diarrhea that began at 9 PM yesterday. She says that she has vomited and had loose watery stools roughly 50 times and when it became too difficult to clean up on her own she called 911. She has a remote surgical history of cholecystectomy. Her husband has had diarrhea but no vomiting. She's had no chest pain. She has moderate to severe cramping diffuse abdominal pain worsen vomiting improved with rest.   Past Medical History:  Diagnosis Date  . Diabetes mellitus   . Hyperlipidemia   . Hypertension   . Viral gastroenteritis due to [place]-like agent Nov 2012    Patient Active Problem List   Diagnosis Date Noted  . Generalized anxiety disorder 09/07/2016  . Unsteady gait 09/07/2016  . Hospital discharge follow-up 09/07/2016  . NSTEMI (non-ST elevated myocardial infarction) ([place]) 08/30/2016  . Pain in joint, ankle and foot 06/24/2014  . S/P laparoscopic cholecystectomy 12/15/2013  . Preoperative general physical examination 12/15/2013  . Encounter for preventive health examination 11/13/2013  . Cervical spine disease 01/27/2013  . Anemia, iron deficiency 01/21/2013  . Right medial knee pain 01/09/2013  . Back pain, thoracic 08/27/2012  . Aortic stenosis, mild 05/06/2012  . Asthma 07/04/2011  . Hemorrhoids, internal 04/05/2011  . Nummular eczema 01/16/2011  . Malignant essential hypertension   . Type II diabetes mellitus with nephropathy ([place])   . Hyperlipidemia     Past Surgical History:  Procedure Laterality Date  . ABDOMINAL HYSTERECTOMY    .  BLADDER SURGERY    . CARDIAC CATHETERIZATION    . CHOLECYSTECTOMY    . LEFT HEART CATH AND CORONARY ANGIOGRAPHY N/A 08/31/2016   Procedure: Left Heart Cath and Coronary Angiography;  Surgeon: Wellington Hampshire, MD;  Location: [place] [facility];  Service: Cardiovascular;  Laterality: N/A;    Prior to Admission medications   Medication Sig Start Date End Date Taking? Authorizing Provider  amLODipine (NORVASC) 5 MG tablet TAKE 1 TABLET BY MOUTH DAILY 09/26/16   [name], MD  atorvastatin (LIPITOR) 40 MG tablet TAKE 1 TABLET BY MOUTH DAILY AT 6PM 09/26/16   [name], MD  BD MICROTAINER LANCETS MISC Contact  Activated lancet 1.8 mm x 21 g Use once daily to check blood sugars 07/10/16   [name], MD  Calcium Carb-Cholecalciferol (CALCIUM 600 + D PO) Take by mouth daily.    [provider]  calcium carbonate (TUMS) 500 MG chewable tablet Chew 1 tablet by mouth. Taken when needed    [provider]  ciprofloxacin (CIPRO) 500 MG tablet Take 1 tablet (500 mg total) by mouth 2 (two) times daily. 11/01/16 11/04/16  Darel Hong, MD  ciprofloxacin (CIPRO) 500 MG tablet Take 1 tablet (500 mg total) by mouth 2 (two) times daily. 11/01/16   [name], MD  clopidogrel (PLAVIX) 75 MG tablet Take 1 tablet (75 mg total) by mouth daily with breakfast. 09/02/16   Gouru, Illene Silver, MD  fenofibrate 160 MG tablet TAKE 1 TABLET BY MOUTH DAILY FOR HIGH TRIGLYCERIDES 06/28/16   [name], MD  glucose blood (  BAYER CONTOUR TEST) test strip Use as instructed 02/28/13   [name], MD  loratadine (CLARITIN) 10 MG tablet Take 10 mg by mouth daily.    [provider]  metFORMIN (GLUCOPHAGE-XR) 750 MG 24 hr tablet Take 750 mg by mouth daily with breakfast.    [provider]  metFORMIN (GLUCOPHAGE-XR) 750 MG 24 hr tablet TAKE 1 TABLET BY MOUTH DAILY WITH BREAKFAST 10/18/16   [name], MD  metoprolol succinate (TOPROL-XL) 50 MG 24 hr tablet TAKE 1 TABLET BY MOUTH  DAILY 09/26/16   [name], MD  Multiple Vitamins-Minerals (ABC PLUS SENIOR PO) Take by mouth.      [provider]  mupirocin ointment (BACTROBAN) 2 % Apply 1 application topically 2 (two) times daily. To left earlobe 08/28/16   [name], MD  nitroGLYCERIN (NITROSTAT) 0.4 MG SL tablet Place 1 tablet (0.4 mg total) under the tongue every 5 (five) minutes as needed for chest pain. 09/02/16   Gouru, Illene Silver, MD  ondansetron (ZOFRAN ODT) 4 MG disintegrating tablet Take 1 tablet (4 mg total) by mouth every 8 (eight) hours as needed for nausea or vomiting. 11/01/16   Darel Hong, MD  ondansetron (ZOFRAN ODT) 4 MG disintegrating tablet Take 1 tablet (4 mg total) by mouth every 8 (eight) hours as needed for nausea or vomiting. 11/01/16   [name], MD  sertraline (ZOLOFT) 25 MG tablet Take 1 tablet (25 mg total) by mouth daily. AFTER DINNER 09/27/16   [name], MD  valsartan (DIOVAN) 320 MG tablet TAKE 1 TABLET BY MOUTH DAILY 06/28/16   [name], MD    Allergies Aspirin; Atropine; Belladonna alkaloids; Codeine; Darvon; Methocarbamol; Penicillins; Sulfa antibiotics; and Iron  Family History  Problem Relation Age of Onset  . Cancer Mother   . Breast cancer Mother 63  . Breast cancer Sister 73    Social History Social History  Substance Use Topics  . Smoking status: Never Smoker  . Smokeless tobacco: Never Used  . Alcohol use No    Review of Systems Constitutional: No fever/chills Eyes: No visual changes. ENT: No sore throat. Cardiovascular: Denies chest pain. Respiratory: Denies shortness of breath. Gastrointestinal: Positive abdominal pain.  Positive nausea, positive vomiting.  Positive diarrhea.  No constipation. Genitourinary: Negative for dysuria. Musculoskeletal: Negative for back pain. Skin: Negative for rash. Neurological: Negative for headaches, focal weakness or numbness.   ____________________________________________   PHYSICAL  EXAM:  VITAL SIGNS: ED Triage Vitals  Enc Vitals Group     BP 11/01/16 0600 136/60     Pulse Rate 11/01/16 0548 98     Resp 11/01/16 0548 (!) 22     Temp 11/01/16 0548 98.7 F (37.1 C)     Temp Source 11/01/16 0548 Oral     SpO2 11/01/16 0548 92 %     Weight 11/01/16 0549 124 lb (56.2 kg)     Height 11/01/16 0549 5\' 3"  (1.6 m)     Head Circumference --      Peak Flow --      Pain Score 11/01/16 0548 0     Pain Loc --      Pain Edu? --      Excl. in [place]? --     Constitutional: Alert and oriented 4 appears uncomfortable but in no acute distress Eyes: PERRL EOMI. Head: Atraumatic. Nose: No congestion/rhinnorhea. Mouth/Throat: No trismus Neck: No stridor.   Cardiovascular: Tachycardic rate, regular rhythm. 3-6 systolic ejection murmur Good  peripheral circulation. Respiratory: Normal respiratory effort.  No retractions. Lungs CTAB and moving good air Gastrointestinal: Soft nondistended mild diffuse tenderness with no focality no rebound or guarding no peritonitis Musculoskeletal: No lower extremity edema   Neurologic:  Normal speech and language. No gross focal neurologic deficits are appreciated. Skin:  Skin is warm, dry and intact. No rash noted. Psychiatric: Mood and affect are normal. Speech and behavior are normal.    ____________________________________________   DIFFERENTIAL includes but not limited to  Food poisoning, viral gastroenteritis, dysentery, volvulus, appendicitis, diverticulitis, dehydration ____________________________________________   LABS (all labs ordered are listed, but only abnormal results are displayed)  Labs Reviewed  COMPREHENSIVE METABOLIC PANEL - Abnormal; Notable for the following:       Result Value   Glucose, Bld 292 (*)    BUN 40 (*)    GFR calc non Af Amer 51 (*)    GFR calc Af Amer 60 (*)    All other components within normal limits  CBC - Abnormal; Notable for the following:    WBC 14.3 (*)    Hemoglobin 11.7 (*)    HCT 34.2  (*)    All other components within normal limits  URINALYSIS, COMPLETE (UACMP) WITH MICROSCOPIC - Abnormal; Notable for the following:    Color, Urine YELLOW (*)    APPearance CLEAR (*)    Glucose, UA >=500 (*)    Protein, ur 100 (*)    Leukocytes, UA MODERATE (*)    Bacteria, UA RARE (*)    Squamous Epithelial / LPF 0-5 (*)    All other components within normal limits  GASTROINTESTINAL PANEL BY PCR, STOOL (REPLACES STOOL CULTURE)  LIPASE, BLOOD  TROPONIN I  LACTIC ACID, PLASMA  CK  LACTIC ACID, PLASMA    Elevated sugar but no anion gap elevated white count is nonspecific __________________________________________  EKG  ED ECG REPORT I, Darel Hong, the attending physician, personally viewed and interpreted this ECG.  Date: 11/01/2016 EKG Time: 0548 Rate: 99 Rhythm: normal sinus rhythm QRS Axis: normal Intervals: normal ST/T Wave abnormalities: normal Narrative Interpretation: unremarkable although very wavy baseline makes interpretation of the inferior leads particularly challenging  ____________________________________________  RADIOLOGY  CT abdomen and pelvis with no acute disease although suggestive of possible rectal cancer ____________________________________________   PROCEDURES  Procedure(s) performed: no  Procedures  Critical Care performed: no  Observation: no ____________________________________________   INITIAL IMPRESSION / ASSESSMENT AND PLAN / ED COURSE  Pertinent labs & imaging results that were available during my care of the patient were reviewed by me and considered in my medical decision making (see chart for details).  The patient appears uncomfortable and clearly nauseated. She declines pain medication at this time. Her history is most consistent with either preformed food toxin versus viral gastroenteritis, however given her age and significant abdominal pain and tenderness I'm concerned about volvulus versus appendicitis and  diverticulitis etc. is pending.   ____________________________________________  ----------------------------------------- 9:43 AM on 11/01/2016 -----------------------------------------  After discharge the patient's primary care physician Dr. Derrel Nip arrived to provide collateral information. Apparently the patient's daughter is currently being treated with ciprofloxacin for Salmonella. Given this additional history think it is reasonable to treat her with ciprofloxacin for 3 days.  ----------------------------------------- 10:56 AM on 11/01/2016 ----------------------------------------- The patient got into a wheelchair to go to the car after discharge in her eyes rolled back in her head she syncopized became diaphoretic and had a myoclonic jerk. She was awake nearly immediately and her blood sugar was  226. At this point she will require inpatient admission for dehydration and syncope. Repeat EKG performed and shows no arrhythmia no blocks no signs of acute ischemia and normal intervals.   FINAL CLINICAL IMPRESSION(S) / ED DIAGNOSES  Final diagnoses:  Nausea vomiting and diarrhea  Rectal cancer (HCC)  Syncope, unspecified syncope type      NEW MEDICATIONS STARTED DURING THIS VISIT:  New Prescriptions   CIPROFLOXACIN (CIPRO) 500 MG TABLET    Take 1 tablet (500 mg total) by mouth 2 (two) times daily.   CIPROFLOXACIN (CIPRO) 500 MG TABLET    Take 1 tablet (500 mg total) by mouth 2 (two) times daily.   ONDANSETRON (ZOFRAN ODT) 4 MG DISINTEGRATING TABLET    Take 1 tablet (4 mg total) by mouth every 8 (eight) hours as needed for nausea or vomiting.   ONDANSETRON (ZOFRAN ODT) 4 MG DISINTEGRATING TABLET    Take 1 tablet (4 mg total) by mouth every 8 (eight) hours as needed for nausea or vomiting.     Note:  This document was prepared using Dragon voice recognition software and may include unintentional dictation errors.     Darel Hong, MD 11/01/16 1103

## 2016-11-01 NOTE — Progress Notes (Signed)
Patient is admitted from ED via cart and ED staff. A&O x4. NSL in RFA. Tele placed, On isolation for r/o c-diff. Reviewed POC and isolation with family and patient. Oriented to room, call light, TV and bed controls. Bed alarm on for safety.

## 2016-11-02 ENCOUNTER — Observation Stay: Payer: Medicare Other

## 2016-11-02 DIAGNOSIS — A080 Rotaviral enteritis: Secondary | ICD-10-CM | POA: Diagnosis not present

## 2016-11-02 DIAGNOSIS — R55 Syncope and collapse: Secondary | ICD-10-CM | POA: Diagnosis not present

## 2016-11-02 DIAGNOSIS — J9601 Acute respiratory failure with hypoxia: Secondary | ICD-10-CM | POA: Diagnosis not present

## 2016-11-02 DIAGNOSIS — R933 Abnormal findings on diagnostic imaging of other parts of digestive tract: Secondary | ICD-10-CM

## 2016-11-02 DIAGNOSIS — A084 Viral intestinal infection, unspecified: Secondary | ICD-10-CM | POA: Diagnosis not present

## 2016-11-02 DIAGNOSIS — J690 Pneumonitis due to inhalation of food and vomit: Secondary | ICD-10-CM | POA: Diagnosis not present

## 2016-11-02 DIAGNOSIS — J9811 Atelectasis: Secondary | ICD-10-CM | POA: Diagnosis not present

## 2016-11-02 LAB — BASIC METABOLIC PANEL
Anion gap: 4 — ABNORMAL LOW (ref 5–15)
BUN: 29 mg/dL — AB (ref 6–20)
CALCIUM: 8.4 mg/dL — AB (ref 8.9–10.3)
CO2: 21 mmol/L — AB (ref 22–32)
Chloride: 113 mmol/L — ABNORMAL HIGH (ref 101–111)
Creatinine, Ser: 1.19 mg/dL — ABNORMAL HIGH (ref 0.44–1.00)
GFR calc Af Amer: 45 mL/min — ABNORMAL LOW (ref >60)
GFR, EST NON AFRICAN AMERICAN: 39 mL/min — AB (ref >60)
GLUCOSE: 119 mg/dL — AB (ref 65–99)
Potassium: 3.2 mmol/L — ABNORMAL LOW (ref 3.5–5.1)
Sodium: 138 mmol/L (ref 135–145)

## 2016-11-02 LAB — GLUCOSE, CAPILLARY
GLUCOSE-CAPILLARY: 151 mg/dL — AB (ref 65–99)
GLUCOSE-CAPILLARY: 131 mg/dL — AB (ref 65–99)
Glucose-Capillary: 99 mg/dL (ref 65–99)
Glucose-Capillary: 117 mg/dL — ABNORMAL HIGH (ref 65–99)

## 2016-11-02 LAB — CBC
HEMATOCRIT: 28.8 % — AB (ref 35.0–47.0)
Hemoglobin: 9.7 g/dL — ABNORMAL LOW (ref 12.0–16.0)
MCH: 28.9 pg (ref 26.0–34.0)
MCHC: 33.8 g/dL (ref 32.0–36.0)
MCV: 85.4 fL (ref 80.0–100.0)
Platelets: 217 10*3/uL (ref 150–440)
RBC: 3.38 MIL/uL — ABNORMAL LOW (ref 3.80–5.20)
RDW: 14.1 % (ref 11.5–14.5)
WBC: 8.2 10*3/uL (ref 3.6–11.0)

## 2016-11-02 MED ORDER — POTASSIUM CHLORIDE CRYS ER 20 MEQ PO TBCR
40.0000 meq | EXTENDED_RELEASE_TABLET | Freq: Once | ORAL | Status: AC
  Administered 2016-11-02: 40 meq via ORAL
  Filled 2016-11-02: qty 2

## 2016-11-02 MED ORDER — POTASSIUM CHLORIDE IN NACL 20-0.9 MEQ/L-% IV SOLN
INTRAVENOUS | Status: DC
  Filled 2016-11-02 (×2): qty 1000

## 2016-11-02 MED ORDER — CIPROFLOXACIN HCL 500 MG PO TABS: 500.0000 mg | ORAL_TABLET | ORAL | Status: DC

## 2016-11-02 MED ORDER — LOPERAMIDE HCL 2 MG PO CAPS
2.0000 mg | ORAL_CAPSULE | Freq: Four times a day (QID) | ORAL | Status: DC | PRN
  Filled 2016-11-02: qty 1

## 2016-11-02 MED ORDER — LEVOFLOXACIN 500 MG PO TABS
500.0000 mg | ORAL_TABLET | Freq: Once | ORAL | Status: AC
  Administered 2016-11-02: 500 mg via ORAL
  Filled 2016-11-02: qty 1

## 2016-11-02 MED ORDER — LEVOFLOXACIN 500 MG PO TABS: 250.0000 mg | ORAL_TABLET | Freq: Every day | ORAL | Status: DC

## 2016-11-02 MED ORDER — ENOXAPARIN SODIUM 30 MG/0.3ML ~~LOC~~ SOLN
30.0000 mg | SUBCUTANEOUS | Status: DC
  Administered 2016-11-02: 30 mg via SUBCUTANEOUS
  Filled 2016-11-02: qty 0.3

## 2016-11-02 NOTE — Progress Notes (Signed)
Anticoagulation monitoring(Lovenox):  83yo  F ordered Lovenox 40 mg Q24h  Filed Weights   11/01/16 0549 11/01/16 1301 11/02/16 0645  Weight: 124 lb (56.2 kg) 135 lb 1.6 oz (61.3 kg) 134 lb 12.8 oz (61.1 kg)   BMI 24   Lab Results  Component Value Date   CREATININE 1.19 (H) 11/02/2016   CREATININE 0.94 11/01/2016   CREATININE 1.20 09/07/2016   Estimated Creatinine Clearance: 25.5 mL/min (A) (by C-G formula based on SCr of 1.19 mg/dL (H)). Hemoglobin & Hematocrit     Component Value Date/Time   HGB 9.7 (L) 11/02/2016 0306   HCT 28.8 (L) 11/02/2016 8937     Per Protocol for Patient with estCrcl < 30 ml/min and BMI < 40, will transition to Lovenox 30 mg Q24h     Chinita Greenland PharmD Clinical Pharmacist 11/02/2016

## 2016-11-02 NOTE — Consult Note (Signed)
Maureen Lame, MD [facility]  [address], [place], [place] [address] Phone: [phone] Fax : [phone]  Consultation  Referring Provider:     Dr. Bridgett Larsson Primary Care Physician:  [name], MD Primary Gastroenterologist:  Dr. Vira Agar         Reason for Consultation:     Abnormal CT scan  Date of Admission:  11/01/2016 Date of Consultation:  11/02/2016         HPI:   Maureen Morales is a 83 y.o. female Who is admitted with diarrhea due to rotavirus.  The patient had a CT scan of the abdomen and was found to have thickening of the lateral wall of the colon consistent with possible inflammation versus a possible mass.  The patient reports that she has not had any endoscopic procedures in many years. The patient had a lot of sick contacts around her including her daughter a friend and her husband.  The patient states that they all had diarrhea and then it resolved while she was having nausea vomiting and diarrhea.  The patient states that her diarrhea has improved since admission but has not gone away.  She reports that she has a bowel movement right after eating any meal.  The patient also denies any black stools but has said that she has seen some blood from her rectum. The patient was also put on antibiotics for possible C. Difficile since family members were sick recently.  The patient is now being seen due to rectal thickening and a possible GI mass.  Past Medical History:  Diagnosis Date  . CAD (coronary artery disease)   . Diabetes mellitus   . Hyperlipidemia   . Hypertension   . Viral gastroenteritis due to [place]-like agent Nov 2012    Past Surgical History:  Procedure Laterality Date  . ABDOMINAL HYSTERECTOMY    . BLADDER SURGERY    . CARDIAC CATHETERIZATION    . CHOLECYSTECTOMY    . LEFT HEART CATH AND CORONARY ANGIOGRAPHY N/A 08/31/2016   Procedure: Left Heart Cath and Coronary Angiography;  Surgeon: Wellington Hampshire, MD;  Location: [place] [facility];  Service:  Cardiovascular;  Laterality: N/A;    Prior to Admission medications   Medication Sig Start Date End Date Taking? Authorizing Provider  amLODipine (NORVASC) 5 MG tablet TAKE 1 TABLET BY MOUTH DAILY 09/26/16  Yes [name], MD  atorvastatin (LIPITOR) 40 MG tablet TAKE 1 TABLET BY MOUTH DAILY AT 6PM 09/26/16  Yes [name], MD  calcium carbonate (TUMS) 500 MG chewable tablet Chew 1 tablet by mouth. Taken when needed   Yes [provider]  ciprofloxacin (CIPRO) 500 MG tablet Take 1 tablet (500 mg total) by mouth 2 (two) times daily. 11/01/16  Yes [name], MD  clopidogrel (PLAVIX) 75 MG tablet Take 1 tablet (75 mg total) by mouth daily with breakfast. 09/02/16  Yes Gouru, Aruna, MD  fenofibrate 160 MG tablet TAKE 1 TABLET BY MOUTH DAILY FOR HIGH TRIGLYCERIDES 06/28/16  Yes [name], MD  loratadine (CLARITIN) 10 MG tablet Take 10 mg by mouth daily.   Yes [provider]  metFORMIN (GLUCOPHAGE-XR) 750 MG 24 hr tablet TAKE 1 TABLET BY MOUTH DAILY WITH BREAKFAST 10/18/16  Yes [name], MD  metoprolol succinate (TOPROL-XL) 50 MG 24 hr tablet TAKE 1 TABLET BY MOUTH DAILY 09/26/16  Yes [name], MD  mupirocin ointment (BACTROBAN) 2 % Apply 1 application topically 2 (two)  times daily. To left earlobe 08/28/16  Yes [name], MD  nitroGLYCERIN (NITROSTAT) 0.4 MG SL tablet Place 1 tablet (0.4 mg total) under the tongue every 5 (five) minutes as needed for chest pain. 09/02/16  Yes Gouru, Aruna, MD  ondansetron (ZOFRAN ODT) 4 MG disintegrating tablet Take 1 tablet (4 mg total) by mouth every 8 (eight) hours as needed for nausea or vomiting. 11/01/16  Yes [name], MD  pantoprazole (PROTONIX) 40 MG tablet Take 40 mg by mouth daily.   Yes [provider]  sertraline (ZOLOFT) 25 MG tablet Take 1 tablet (25 mg total) by mouth daily. AFTER DINNER 09/27/16  Yes [name], MD  valsartan (DIOVAN) 320 MG tablet TAKE 1 TABLET BY MOUTH DAILY 06/28/16   Yes [name], MD  BD MICROTAINER LANCETS MISC Contact  Activated lancet 1.8 mm x 21 g Use once daily to check blood sugars 07/10/16   [name], MD  ciprofloxacin (CIPRO) 500 MG tablet Take 1 tablet (500 mg total) by mouth 2 (two) times daily. 11/01/16 11/04/16  Darel Hong, MD  glucose blood (BAYER CONTOUR TEST) test strip Use as instructed 02/28/13   [name], MD  ondansetron (ZOFRAN ODT) 4 MG disintegrating tablet Take 1 tablet (4 mg total) by mouth every 8 (eight) hours as needed for nausea or vomiting. 11/01/16   Darel Hong, MD    Family History  Problem Relation Age of Onset  . Cancer Mother   . Breast cancer Mother 57  . Breast cancer Sister 71     Social History  Substance Use Topics  . Smoking status: Never Smoker  . Smokeless tobacco: Never Used  . Alcohol use No    Allergies as of 11/01/2016 - Review Complete 11/01/2016  Allergen Reaction Noted  . Aspirin  09/23/2010  . Atropine  09/23/2010  . Belladonna alkaloids  09/23/2010  . Codeine  09/23/2010  . Darvon  09/23/2010  . Methocarbamol  07/31/2013  . Penicillins  09/23/2010  . Sulfa antibiotics  08/30/2016  . Iron Other (See Comments) 04/29/2013    Review of Systems:    All systems reviewed and negative except where noted in HPI.   Physical Exam:  Vital signs in last 24 hours: Temp:  [98.4 F (36.9 C)-100.5 F (38.1 C)] 100.5 F (38.1 C) (07/05 1508) Pulse Rate:  [78-91] 91 (07/05 1508) Resp:  [16] 16 (07/05 1508) BP: (126-153)/(47-51) 153/51 (07/05 1508) SpO2:  [99 %] 99 % (07/05 1508) Weight:  [134 lb 12.8 oz (61.1 kg)] 134 lb 12.8 oz (61.1 kg) (07/05 0645) Last BM Date: 11/02/16 General:   Pleasant, cooperative in NAD Head:  Normocephalic and atraumatic. Eyes:   No icterus.   Conjunctiva pink. PERRLA. Ears:  Normal auditory acuity. Neck:  Supple; no masses or thyroidomegaly Lungs: Respirations even and unlabored. Lungs clear to auscultation bilaterally.   No wheezes,  crackles, or rhonchi.  Heart:  Regular rate and rhythm;  Without murmur, clicks, rubs or gallops Abdomen:  Soft, nondistended, nontender. Normal bowel sounds. No appreciable masses or hepatomegaly.  No rebound or guarding.  Rectal:  Not performed. Msk:  Symmetrical without gross deformities.    Extremities:  Without edema, cyanosis or clubbing. Neurologic:  Alert and oriented x3;  grossly normal neurologically. Skin:  Intact without significant lesions or rashes. Cervical Nodes:  No significant cervical adenopathy. Psych:  Alert and cooperative. Normal affect.  LAB RESULTS:  Recent Labs  11/01/16 0547 11/02/16 0306  WBC  14.3* 8.2  HGB 11.7* 9.7*  HCT 34.2* 28.8*  PLT 273 217   BMET  Recent Labs  11/01/16 0547 11/02/16 0306  NA 137 138  K 4.0 3.2*  CL 103 113*  CO2 24 21*  GLUCOSE 292* 119*  BUN 40* 29*  CREATININE 0.94 1.19*  CALCIUM 9.6 8.4*   LFT  Recent Labs  11/01/16 0547  PROT 7.6  ALBUMIN 4.3  AST 35  ALT 31  ALKPHOS 56  BILITOT 0.7   PT/INR No results for input(s): LABPROT, INR in the last 72 hours.  STUDIES: Dg Chest 2 View  Result Date: 11/02/2016 CLINICAL DATA:  Aspiration. EXAM: CHEST  2 VIEW COMPARISON:  11/01/2016. FINDINGS: Mediastinum and hilar structures are normal. Low lung volumes with mild basilar atelectasis. Mild bibasilar infiltrates. No pleural effusion or pneumothorax. Heart size normal. No acute bony abnormalities. IMPRESSION: Low lung volumes with mild bibasilar atelectasis. Mild bibasilar infiltrates. Electronically Signed   By: Marcello Moores  Register   On: 11/02/2016 06:37   Ct Abdomen Pelvis W Contrast  Result Date: 11/01/2016 CLINICAL DATA:  Abdominal pain, vomiting EXAM: CT ABDOMEN AND PELVIS WITH CONTRAST TECHNIQUE: Multidetector CT imaging of the abdomen and pelvis was performed using the standard protocol following bolus administration of intravenous contrast. CONTRAST:  35mL ISOVUE-300 IOPAMIDOL (ISOVUE-300) INJECTION 61%  COMPARISON:  12/08/2013 FINDINGS: Lower chest: Small hiatal hernia. Heart is borderline in size. Calcified mitral valve annular valve. Patchy ground-glass airspace opacities in both lower lobes, right greater than left. No effusions. Hepatobiliary: No focal liver abnormality is seen. Status post cholecystectomy. No biliary dilatation. Pancreas: No focal abnormality or ductal dilatation. Spleen: No focal abnormality.  Normal size. Adrenals/Urinary Tract: Adrenal glands unremarkable. Numerous bilateral renal cysts. No hydronephrosis. Urinary bladder unremarkable. Stomach/Bowel: Sigmoid diverticulosis. No active diverticulitis. Appendix is normal. Stomach and small bowel decompressed, unremarkable. There is suggestion of wall thickening/ enhancement along the left lateral rectal wall. Cannot exclude rectal mass (image 80). Recommend correlation with digital rectal exam and possibly colonoscopy. Vascular/Lymphatic: Aortic and iliac calcifications. No aneurysm or adenopathy. Reproductive: Prior hysterectomy.  No adnexal masses. Other: No free fluid or free air. Musculoskeletal: Degenerative changes throughout the lumbar spine. No acute bony abnormality. IMPRESSION: Possible left rectal wall thickening/ mass. Cannot exclude rectal cancer. Recommend correlation with physical exam and possible colonoscopy (or prior recent colonoscopy). Sigmoid diverticulosis. Small hiatal hernia. Electronically Signed   By: Rolm Baptise M.D.   On: 11/01/2016 07:50   Dg Chest Port 1 View  Result Date: 11/01/2016 CLINICAL DATA:  Abdominal pain, vomiting. EXAM: PORTABLE CHEST 1 VIEW COMPARISON:  08/30/2016 FINDINGS: Mild hyperinflation. Heart and mediastinal contours are within normal limits. No focal opacities or effusions. No acute bony abnormality. IMPRESSION: Hyperinflation.  No active cardiopulmonary disease. Electronically Signed   By: Rolm Baptise M.D.   On: 11/01/2016 07:43      Impression / Plan:   Maureen Morales is a 83 y.o.  y/o female with Diarrhea with nausea and vomiting and a positive stool test for rotavirus.  The patient had a CT scan that showed some possible thickening of the rectal with the possibility of a neoplasm.  The patient is reluctant to undergo any endoscopic procedures and states that even if a mass was found she would not want to have surgery.  The family is also concerned because at her age she still enjoys going out and enjoying herself and they feel that if she was diagnosed with cancer that she would stop doing the  things she enjoys.  I have offered to do a rectal exam to see if a mass could be felt since it does not appear the patient has had a rectal exam since admission.  I have also informed them that we can do a endoscopy without any sedation.  The patient and her family spoke to [place] And they would rather repeat the CT scan in a few weeks to see if the thickening had resolved.  The request no further workup of this rectal finding during this admission.  I will sign off.  Please call if any further GI concerns or questions.  We would like to thank you for the opportunity to participate in the care of GLENOLA WHEAT.     Thank you for involving me in the care of this patient.      LOS: 0 days   Maureen Lame, MD  11/02/2016, 7:37 PM   Note: This dictation was prepared with Dragon dictation along with smaller phrase technology. Any transcriptional errors that result from this process are unintentional.

## 2016-11-02 NOTE — Care Management Note (Signed)
Case Management Note  Patient Details  Name: BRENDALIZ KUK MRN: [id number] Date of Birth: 12/02/1922  Subjective/Objective: Admitted with gastroenteritis. Met with daughter outside of room. She states patient lives with her husband. She uses no DME, No home O2. No home health. Patient still drives and plays Bridge. PT consult is pending. Will follow and monitor progression. No needs anticipated at time of assessment. PCP is  Dr. Derrel Nip.                   Action/Plan:   Expected Discharge Date:  11/03/16               Expected Discharge Plan:     In-House Referral:     Discharge planning Services  CM Consult  Post Acute Care Choice:    Choice offered to:     DME Arranged:    DME Agency:     HH Arranged:    HH Agency:     Status of Service:  In process, will continue to follow  If discussed at Long Length of Stay Meetings, dates discussed:    Additional Comments:  Jolly Mango, RN 11/02/2016, 8:47 AM

## 2016-11-02 NOTE — Progress Notes (Addendum)
[facility] at [place] NAME: Maureen Morales    MR#:  [id number]  DATE OF BIRTH:  [birth year]  SUBJECTIVE:  CHIEF COMPLAINT:   Chief Complaint  Patient presents with  . Nausea  . Emesis   Still has diarrhea, generalized weakness and poor oral intake. REVIEW OF SYSTEMS:  Review of Systems  Constitutional: Positive for malaise/fatigue. Negative for chills and fever.  HENT: Negative for sore throat.   Eyes: Negative for blurred vision and double vision.  Respiratory: Negative for cough, shortness of breath, wheezing and stridor.   Cardiovascular: Negative for chest pain and leg swelling.  Gastrointestinal: Positive for diarrhea. Negative for abdominal pain, blood in stool, melena, nausea and vomiting.  Genitourinary: Negative for dysuria and hematuria.  Musculoskeletal: Negative for back pain.  Skin: Negative for itching and rash.  Neurological: Positive for weakness. Negative for dizziness, focal weakness, loss of consciousness and headaches.  Psychiatric/Behavioral: Negative for depression. The patient is not nervous/anxious.     DRUG ALLERGIES:   Allergies  Allergen Reactions  . Aspirin   . Atropine   . Belladonna Alkaloids   . Codeine   . Darvon   . Methocarbamol   . Penicillins   . Sulfa Antibiotics   . Iron Other (See Comments)    High dose prescription gives her diarrhea   VITALS:  Blood pressure (!) 153/51, pulse 91, temperature (!) 100.5 F (38.1 C), temperature source Oral, resp. rate 16, height 5\' 3"  (1.6 m), weight 134 lb 12.8 oz (61.1 kg), SpO2 99 %. PHYSICAL EXAMINATION:  Physical Exam  Constitutional: She is oriented to person, place, and time and well-developed, well-nourished, and in no distress.  HENT:  Head: Normocephalic.  Dry oral mucosa.  Eyes: Conjunctivae and EOM are normal.  Neck: Normal range of motion. Neck supple. No JVD present. No tracheal deviation present.  Cardiovascular: Normal rate, regular  rhythm and normal heart sounds.  Exam reveals no gallop.   No murmur heard. Pulmonary/Chest: Effort normal and breath sounds normal. No respiratory distress. She has no wheezes. She has no rales.  Abdominal: Soft. Bowel sounds are normal. She exhibits no distension. There is no tenderness.  Musculoskeletal: Normal range of motion. She exhibits no edema or tenderness.  Neurological: She is oriented to person, place, and time. No cranial nerve deficit.  Skin: No rash noted. No erythema.  Psychiatric: Affect normal.   LABORATORY PANEL:  Female CBC  Recent Labs Lab 11/02/16 0306  WBC 8.2  HGB 9.7*  HCT 28.8*  PLT 217   ------------------------------------------------------------------------------------------------------------------ Chemistries   Recent Labs Lab 11/01/16 0547 11/01/16 0716 11/02/16 0306  NA 137  --  138  K 4.0  --  3.2*  CL 103  --  113*  CO2 24  --  21*  GLUCOSE 292*  --  119*  BUN 40*  --  29*  CREATININE 0.94  --  1.19*  CALCIUM 9.6  --  8.4*  MG  --  1.8  --   AST 35  --   --   ALT 31  --   --   ALKPHOS 56  --   --   BILITOT 0.7  --   --    RADIOLOGY:  Dg Chest 2 View  Result Date: 11/02/2016 CLINICAL DATA:  Aspiration. EXAM: CHEST  2 VIEW COMPARISON:  11/01/2016. FINDINGS: Mediastinum and hilar structures are normal. Low lung volumes with mild basilar atelectasis. Mild bibasilar infiltrates. No pleural effusion or  pneumothorax. Heart size normal. No acute bony abnormalities. IMPRESSION: Low lung volumes with mild bibasilar atelectasis. Mild bibasilar infiltrates. Electronically Signed   By: Marcello Moores  Register   On: 11/02/2016 06:37   ASSESSMENT AND PLAN:   * Acute gastroenteritis. Due to Rotavirus, negative C. difficile PCR. discontinue ciprofloxacin.. Soft diet. Continue IV fluids. Imodium when necessary.  * Syncope due to orthostatic hypotension. Patient did get extremely dizzy on standing up. We will lace on telemetry for 24 hours. No chest pain.  Troponin normal. EKG shows nothing acute.  Dehydration. Improving with IV fluid support.  Hypokalemia. Give potassium supplement and follow-up BMP. Magnesium is normal.  * Acute hypoxic respiratory failure likely due to aspiration pneumonia. She is on clindamycin in addition to ciprofloxacin. Change to Levaquin. Wean oxygen as tolerated. Nebs when necessary. Incentive spirometer.  * Hypertension. Continue home medications.  * Diabetes mellitus. Hold metformin. on sliding scale insulin.  * CAD. Continue patient's aspirin, Plavix and beta blocker, statin.  Rectal thickening and possible mass. GI consult.  Generalized weakness. PT evaluation. All the records are reviewed and case discussed with Care Management/Social Worker. Management plans discussed with the patient, her daughter and they are in agreement.  CODE STATUS: DNR  TOTAL TIME TAKING CARE OF THIS PATIENT: 37 minutes.   More than 50% of the time was spent in counseling/coordination of care: YES  POSSIBLE D/C IN 1-2 DAYS, DEPENDING ON CLINICAL CONDITION.   Demetrios Loll M.D on 11/02/2016 at 4:33 PM  Between 7am to 6pm - Pager - [phone]  After 6pm go to [email or web address] - Proofreader  [facility]  Office  [phone]  CC: Primary care physician; [name], MD  Note: This dictation was prepared with Dragon dictation along with smaller phrase technology. Any transcriptional errors that result from this process are unintentional.

## 2016-11-02 NOTE — Progress Notes (Signed)
PHARMACY NOTE:  ANTIMICROBIAL RENAL DOSAGE ADJUSTMENT  Current antimicrobial regimen includes a mismatch between antimicrobial dosage and estimated renal function.  As per policy approved by the Pharmacy & Therapeutics and Medical Executive Committees, the antimicrobial dosage will be adjusted accordingly.  Current antimicrobial dosage:  Cipro 500 mg po q12h  Indication: GI  Renal Function: Estimated Creatinine Clearance: 25.5 mL/min (A) (by C-G formula based on SCr of 1.19 mg/dL (H)).  Antimicrobial dosage has been changed to:   Cipro 500 mg po Q24h    Thank you for allowing pharmacy to be a part of this patient's care.  Ajna Moors A, [facility] 11/02/2016 9:38 AM

## 2016-11-02 NOTE — Plan of Care (Signed)
Problem: Bowel/Gastric: Goal: Will not experience complications related to bowel motility Outcome: Progressing Pt is able to tolerate some food this shift, ivf continues, pt verbalizes that she is feeling better.

## 2016-11-02 NOTE — Care Management Obs Status (Signed)
[facility] NOTIFICATION   Patient Details  Name: Maureen Morales MRN: [id number] Date of Birth: [birth year]   Medicare Observation Status Notification Given:  Yes    Jolly Mango, RN 11/02/2016, 8:46 AM

## 2016-11-02 NOTE — Progress Notes (Signed)
PT Cancellation Note  Patient Details Name: Maureen Morales MRN: [id number] DOB: [birth year]   Cancelled Treatment:    Reason Eval/Treat Not Completed: Patient not medically ready;Other (comment). Chart reviewed, nursing consulted: NA reports patient's watery bowels continue to remain high-volume/high-frequency, with increased incontinence, exceeding load capacity of available adult incontinence briefs, recommending to hold OOB evaluation at this time. Will continue to monitor remotely and attempt eval again at later date/time.   11:36 AM, 11/02/16 Etta Grandchild, PT, DPT Physical Therapist - [place] [phone])  [phone] (mobile)    Buccola,Allan C 11/02/2016, 11:32 AM

## 2016-11-03 ENCOUNTER — Telehealth: Payer: Self-pay | Admitting: Internal Medicine

## 2016-11-03 DIAGNOSIS — J9601 Acute respiratory failure with hypoxia: Secondary | ICD-10-CM | POA: Diagnosis not present

## 2016-11-03 DIAGNOSIS — A080 Rotaviral enteritis: Secondary | ICD-10-CM | POA: Diagnosis not present

## 2016-11-03 DIAGNOSIS — E860 Dehydration: Secondary | ICD-10-CM | POA: Diagnosis not present

## 2016-11-03 DIAGNOSIS — R531 Weakness: Secondary | ICD-10-CM | POA: Diagnosis not present

## 2016-11-03 DIAGNOSIS — R55 Syncope and collapse: Secondary | ICD-10-CM | POA: Diagnosis not present

## 2016-11-03 DIAGNOSIS — J690 Pneumonitis due to inhalation of food and vomit: Secondary | ICD-10-CM | POA: Diagnosis not present

## 2016-11-03 DIAGNOSIS — A084 Viral intestinal infection, unspecified: Secondary | ICD-10-CM | POA: Diagnosis not present

## 2016-11-03 LAB — BASIC METABOLIC PANEL
Anion gap: 5 (ref 5–15)
BUN: 19 mg/dL (ref 6–20)
CALCIUM: 8.3 mg/dL — AB (ref 8.9–10.3)
CHLORIDE: 114 mmol/L — AB (ref 101–111)
CO2: 18 mmol/L — ABNORMAL LOW (ref 22–32)
CREATININE: 1.05 mg/dL — AB (ref 0.44–1.00)
GFR calc non Af Amer: 45 mL/min — ABNORMAL LOW (ref >60)
GFR, EST AFRICAN AMERICAN: 52 mL/min — AB (ref >60)
Glucose, Bld: 107 mg/dL — ABNORMAL HIGH (ref 65–99)
Potassium: 3.5 mmol/L (ref 3.5–5.1)
SODIUM: 137 mmol/L (ref 135–145)

## 2016-11-03 LAB — MAGNESIUM: MAGNESIUM: 1.7 mg/dL (ref 1.7–2.4)

## 2016-11-03 LAB — GLUCOSE, CAPILLARY
GLUCOSE-CAPILLARY: 102 mg/dL — AB (ref 65–99)
GLUCOSE-CAPILLARY: 164 mg/dL — AB (ref 65–99)

## 2016-11-03 MED ORDER — ZINC OXIDE 40 % EX OINT
TOPICAL_OINTMENT | Freq: Three times a day (TID) | CUTANEOUS | Status: DC | PRN
  Filled 2016-11-03: qty 114

## 2016-11-03 MED ORDER — MAGNESIUM SULFATE 2 GM/50ML IV SOLN
2.0000 g | Freq: Once | INTRAVENOUS | Status: AC
  Administered 2016-11-03: 2 g via INTRAVENOUS
  Filled 2016-11-03: qty 50

## 2016-11-03 MED ORDER — LEVOFLOXACIN 250 MG PO TABS: 250.0000 mg | ORAL_TABLET | Freq: Every day | ORAL | 0 refills | Status: DC

## 2016-11-03 MED ORDER — LEVOFLOXACIN 500 MG PO TABS
250.0000 mg | ORAL_TABLET | Freq: Every day | ORAL | Status: DC
  Administered 2016-11-03: 250 mg via ORAL
  Filled 2016-11-03: qty 1

## 2016-11-03 MED ORDER — POTASSIUM CHLORIDE CRYS ER 20 MEQ PO TBCR
40.0000 meq | EXTENDED_RELEASE_TABLET | Freq: Once | ORAL | Status: AC
  Administered 2016-11-03: 40 meq via ORAL
  Filled 2016-11-03: qty 2

## 2016-11-03 NOTE — Discharge Summary (Signed)
[facility] at [place] NAME: Maureen Morales    MR#:  [id number]  DATE OF BIRTH:  09-Dec-1922  DATE OF ADMISSION:  11/01/2016   ADMITTING PHYSICIAN: Hillary Bow, MD  DATE OF DISCHARGE: 11/03/2016 PRIMARY CARE PHYSICIAN: [name], MD   ADMISSION DIAGNOSIS:  Rectal cancer (HCC) [C20] Nausea vomiting and diarrhea [R11.2, R19.7] Aspiration into airway [T17.908A] Syncope, unspecified syncope type [R55] DISCHARGE DIAGNOSIS:  Active Problems:   Gastroenteritis   Abnormal CT scan, colon  SECONDARY DIAGNOSIS:   Past Medical History:  Diagnosis Date  . CAD (coronary artery disease)   . Diabetes mellitus   . Hyperlipidemia   . Hypertension   . Viral gastroenteritis due to [place]-like agent Nov 2012   HOSPITAL COURSE:  * Acute gastroenteritis. Due to Rotavirus, negative C. difficile PCR. discontinued ciprofloxacin.. Soft diet. She was on IV fluids. Diarrhea is better today.  * Syncope due to orthostatic hypotension. Patient did get extremely dizzy on standing up. No chest pain. Troponin normal. EKG shows nothing acute.  Dehydration. Improved with IV fluid support.  Hypokalemia. Improved with potassium supplement.. Magnesium is normal.  * Acute hypoxic respiratory failure likely due to aspiration pneumonia. She is on clindamycin in addition to ciprofloxacin. Changed to Levaquin. Weaned off oxygen. Nebs when necessary. Incentive spirometer.  * Hypertension. Continue home medications.  * Diabetes mellitus. Hold metformin. on sliding scale insulin.  * CAD. Continue patient's aspirin, Plavix and beta blocker, statin.  Rectal thickening and possible mass. Per GI consult, the patient doesn't want colonoscopy this time. Follow-up as outpatient.  Generalized weakness. PT evaluation: Home health and PT. .  DISCHARGE CONDITIONS:  Stable, discharge to home with home health today. CONSULTS OBTAINED:   DRUG ALLERGIES:    Allergies  Allergen Reactions  . Aspirin   . Atropine   . Belladonna Alkaloids   . Codeine   . Darvon   . Methocarbamol   . Penicillins   . Sulfa Antibiotics   . Iron Other (See Comments)    High dose prescription gives her diarrhea   DISCHARGE MEDICATIONS:   Allergies as of 11/03/2016      Reactions   Aspirin    Atropine    Belladonna Alkaloids    Codeine    Darvon    Methocarbamol    Penicillins    Sulfa Antibiotics    Iron Other (See Comments)   High dose prescription gives her diarrhea      Medication List    TAKE these medications   amLODipine 5 MG tablet Commonly known as:  NORVASC TAKE 1 TABLET BY MOUTH DAILY   atorvastatin 40 MG tablet Commonly known as:  LIPITOR TAKE 1 TABLET BY MOUTH DAILY AT 6PM   BD MICROTAINER LANCETS Misc Contact  Activated lancet 1.8 mm x 21 g Use once daily to check blood sugars   clopidogrel 75 MG tablet Commonly known as:  PLAVIX Take 1 tablet (75 mg total) by mouth daily with breakfast.   fenofibrate 160 MG tablet TAKE 1 TABLET BY MOUTH DAILY FOR HIGH TRIGLYCERIDES   glucose blood test strip Commonly known as:  BAYER CONTOUR TEST Use as instructed   levofloxacin 250 MG tablet Commonly known as:  LEVAQUIN Take 1 tablet (250 mg total) by mouth daily.   loratadine 10 MG tablet Commonly known as:  CLARITIN Take 10 mg by mouth daily.   metFORMIN 750 MG 24 hr tablet Commonly known as:  GLUCOPHAGE-XR TAKE 1 TABLET  BY MOUTH DAILY WITH BREAKFAST   metoprolol succinate 50 MG 24 hr tablet Commonly known as:  TOPROL-XL TAKE 1 TABLET BY MOUTH DAILY   mupirocin ointment 2 % Commonly known as:  BACTROBAN Apply 1 application topically 2 (two) times daily. To left earlobe   nitroGLYCERIN 0.4 MG SL tablet Commonly known as:  NITROSTAT Place 1 tablet (0.4 mg total) under the tongue every 5 (five) minutes as needed for chest pain.   ondansetron 4 MG disintegrating tablet Commonly known as:  ZOFRAN ODT Take 1 tablet (4  mg total) by mouth every 8 (eight) hours as needed for nausea or vomiting.   pantoprazole 40 MG tablet Commonly known as:  PROTONIX Take 40 mg by mouth daily.   sertraline 25 MG tablet Commonly known as:  ZOLOFT Take 1 tablet (25 mg total) by mouth daily. AFTER DINNER   TUMS 500 MG chewable tablet Generic drug:  calcium carbonate Chew 1 tablet by mouth. Taken when needed   valsartan 320 MG tablet Commonly known as:  DIOVAN TAKE 1 TABLET BY MOUTH DAILY            Durable Medical Equipment        Start     Ordered   11/03/16 1138  For home use only DME Walker rolling  Once    Question:  Patient needs a walker to treat with the following condition  Answer:  Weakness   11/03/16 1139       DISCHARGE INSTRUCTIONS:  See AVS.   If you experience worsening of your admission symptoms, develop shortness of breath, life threatening emergency, suicidal or homicidal thoughts you must seek medical attention immediately by calling 911 or calling your MD immediately  if symptoms less severe.  You Must read complete instructions/literature along with all the possible adverse reactions/side effects for all the Medicines you take and that have been prescribed to you. Take any new Medicines after you have completely understood and accpet all the possible adverse reactions/side effects.   Please note  You were cared for by a hospitalist during your hospital stay. If you have any questions about your discharge medications or the care you received while you were in the hospital after you are discharged, you can call the unit and asked to speak with the hospitalist on call if the hospitalist that took care of you is not available. Once you are discharged, your primary care physician will handle any further medical issues. Please note that NO REFILLS for any discharge medications will be authorized once you are discharged, as it is imperative that you return to your primary care physician (or  establish a relationship with a primary care physician if you do not have one) for your aftercare needs so that they can reassess your need for medications and monitor your lab values.    On the day of Discharge:  VITAL SIGNS:  Blood pressure (!) 134/45, pulse 69, temperature 98.1 F (36.7 C), temperature source Oral, resp. rate 16, height 5\' 3"  (1.6 m), weight 134 lb 12.8 oz (61.1 kg), SpO2 100 %. PHYSICAL EXAMINATION:  GENERAL:  83 y.o.-year-old patient lying in the bed with no acute distress.  EYES: Pupils equal, round, reactive to light and accommodation. No scleral icterus. Extraocular muscles intact.  HEENT: Head atraumatic, normocephalic. Oropharynx and nasopharynx clear.  NECK:  Supple, no jugular venous distention. No thyroid enlargement, no tenderness.  LUNGS: Normal breath sounds bilaterally, no wheezing, rales,rhonchi or crepitation. No use of accessory muscles of  respiration.  CARDIOVASCULAR: S1, S2 normal. No murmurs, rubs, or gallops.  ABDOMEN: Soft, non-tender, non-distended. Bowel sounds present. No organomegaly or mass.  EXTREMITIES: No pedal edema, cyanosis, or clubbing.  NEUROLOGIC: Cranial nerves II through XII are intact. Muscle strength 5/5 in all extremities. Sensation intact. Gait not checked.  PSYCHIATRIC: The patient is alert and oriented x 3.  SKIN: No obvious rash, lesion, or ulcer.  DATA REVIEW:   CBC  Recent Labs Lab 11/02/16 0306  WBC 8.2  HGB 9.7*  HCT 28.8*  PLT 217    Chemistries   Recent Labs Lab 11/01/16 0547  11/03/16 0338  NA 137  < > 137  K 4.0  < > 3.5  CL 103  < > 114*  CO2 24  < > 18*  GLUCOSE 292*  < > 107*  BUN 40*  < > 19  CREATININE 0.94  < > 1.05*  CALCIUM 9.6  < > 8.3*  MG  --   < > 1.7  AST 35  --   --   ALT 31  --   --   ALKPHOS 56  --   --   BILITOT 0.7  --   --   < > = values in this interval not displayed.   Microbiology Results  Results for orders placed or performed during the hospital encounter of  11/01/16  Gastrointestinal Panel by PCR , Stool     Status: Abnormal   Collection Time: 11/01/16 11:09 AM  Result Value Ref Range Status   Campylobacter species NOT DETECTED NOT DETECTED Final   Plesimonas shigelloides NOT DETECTED NOT DETECTED Final   Salmonella species NOT DETECTED NOT DETECTED Final   Yersinia enterocolitica NOT DETECTED NOT DETECTED Final   Vibrio species NOT DETECTED NOT DETECTED Final   Vibrio cholerae NOT DETECTED NOT DETECTED Final   Enteroaggregative E coli (EAEC) NOT DETECTED NOT DETECTED Final   Enteropathogenic E coli (EPEC) NOT DETECTED NOT DETECTED Final   Enterotoxigenic E coli (ETEC) NOT DETECTED NOT DETECTED Final   Shiga like toxin producing E coli (STEC) NOT DETECTED NOT DETECTED Final   Shigella/Enteroinvasive E coli (EIEC) NOT DETECTED NOT DETECTED Final   Cryptosporidium NOT DETECTED NOT DETECTED Final   Cyclospora cayetanensis NOT DETECTED NOT DETECTED Final   Entamoeba histolytica NOT DETECTED NOT DETECTED Final   Giardia lamblia NOT DETECTED NOT DETECTED Final   Adenovirus F40/41 NOT DETECTED NOT DETECTED Final   Astrovirus NOT DETECTED NOT DETECTED Final   Norovirus GI/GII NOT DETECTED NOT DETECTED Final   Rotavirus A DETECTED (A) NOT DETECTED Final   Sapovirus (I, II, IV, and V) NOT DETECTED NOT DETECTED Final  C difficile quick scan w PCR reflex     Status: None   Collection Time: 11/01/16 11:09 AM  Result Value Ref Range Status   C Diff antigen NEGATIVE NEGATIVE Final   C Diff toxin NEGATIVE NEGATIVE Final   C Diff interpretation No C. difficile detected.  Final    RADIOLOGY:  No results found.   Management plans discussed with the patient, her daughter and they are in agreement.  CODE STATUS: DNR   TOTAL TIME TAKING CARE OF THIS PATIENT: 37 minutes.    Demetrios Loll M.D on 11/03/2016 at 1:53 PM  Between 7am to 6pm - Pager - [phone]  After 6pm go to [email or web address] - Proofreader  [facility]   Office  [phone]  CC: Primary care physician; [name], MD  Note: This dictation was prepared with Dragon dictation along with smaller phrase technology. Any transcriptional errors that result from this process are unintentional.

## 2016-11-03 NOTE — Telephone Encounter (Signed)
[place] [phone] called from [facility] pt is being discharged from hospital today. Dx was gastritis. No appt avail for HFU. Pt is going out of town and will be back on 11/10/2016 appt after that date. Thank you!

## 2016-11-03 NOTE — Telephone Encounter (Signed)
Patient will be out of town until 7/13 ok to schedule 11/16/16 at 11:30 for HFU?

## 2016-11-03 NOTE — Care Management Note (Signed)
Case Management Note  Patient Details  Name: Maureen Morales MRN: [id number] Date of Birth: August 09, 1922  Subjective/Objective:  Met with daughter, [name] and patient at bedside. Discussed PT recommendations. Patient has 24 hour care. Patient and daughter are refusing any home health. She has a walker. Provided RNCM contact information if they have needs to arise. Case closed.                    Action/Plan:   Expected Discharge Date:  11/03/16               Expected Discharge Plan:  Home/Self Care  In-House Referral:     Discharge planning Services  CM Consult  Post Acute Care Choice:  Home Health Choice offered to:  Adult Children, Patient  DME Arranged:    DME Agency:     HH Arranged:  Patient Refused [facility] Agency:     Status of Service:  Completed, signed off  If discussed at [place] of Stay Meetings, dates discussed:    Additional Comments:  Jolly Mango, RN 11/03/2016, 11:58 AM

## 2016-11-03 NOTE — Evaluation (Signed)
Physical Therapy Evaluation Patient Details Name: Maureen Morales MRN: [id number] DOB: 07-15-1922 Today's Date: 11/03/2016   History of Present Illness  presented to ER secondary to weakness, dizziness and diarrhea with syncopal episode; admitted with acute gastroenteritis (rotavirus), syncope due to orthstatic hypotension and acute hypoxic respiratory failure due to possible PNA.  Clinical Impression  Upon evaluation, patient alert and oriented; follows all commands and demonstrates good insight, good participation with all evaluation components.  Demonstrates ability to complete bed mobility with mod indep; sit/stand, basic transfers and short-distance gait with HHA +1, min assist, due to poor balance reactions and increased sway in A/P plane (high risk for LOB/fall).  Completed trial with RW, improving distance tolerated (to 40') and level of assist to only cga/close sup. Marked improvement in safety, stability and overall confidence of movement with use of RW; do recommend continued use with all mobility at this time. Would benefit from skilled PT to address above deficits and promote optimal return to PLOF; Recommend transition to [place] upon discharge from acute hospitalization.     Follow Up Recommendations Home health PT    Equipment Recommendations  Rolling walker with 5" wheels    Recommendations for Other Services       Precautions / Restrictions Precautions Precautions: Fall Precaution Comments: enteric iso; no BP L UE Restrictions Weight Bearing Restrictions: No      Mobility  Bed Mobility Overal bed mobility: Modified Independent                Transfers Overall transfer level: Needs assistance Equipment used: 1 person hand held assist Transfers: Sit to/from Stand Sit to Stand: Min assist         General transfer comment: unsteady, min assist for anterior weight translation and static standing balance  Ambulation/Gait Ambulation/Gait assistance: Min  assist Ambulation Distance (Feet): 5 Feet Assistive device: 1 person hand held assist       General Gait Details: broad BOS, staggered steps with poor balance (increased sway in A/P plane).  +1 HHA required at all times.  Stairs            Wheelchair Mobility    Modified Rankin (Stroke Patients Only)       Balance Overall balance assessment: Needs assistance Sitting-balance support: No upper extremity supported;Feet supported Sitting balance-Leahy Scale: Good     Standing balance support: Single extremity supported Standing balance-Leahy Scale: Poor                               Pertinent Vitals/Pain Pain Assessment: No/denies pain    Home Living Family/patient expects to be discharged to:: Private residence Living Arrangements: Spouse/significant other Available Help at Discharge: Family;Personal care attendant;Available 24 hours/day Type of Home: House Home Access: Stairs to enter       Home Equipment: Walker - 2 wheels      Prior Function Level of Independence: Independent         Comments: Pt does not normally need AD, able to drive, run errands, stay active     Hand Dominance        Extremity/Trunk Assessment   Upper Extremity Assessment Upper Extremity Assessment: Overall WFL for tasks assessed    Lower Extremity Assessment Lower Extremity Assessment: Overall WFL for tasks assessed (grossly 4-/5 throughout)       Communication   Communication: No difficulties  Cognition Arousal/Alertness: Awake/alert Behavior During Therapy: WFL for tasks assessed/performed Overall Cognitive Status: Within  Functional Limits for tasks assessed                                        General Comments      Exercises Other Exercises Other Exercises: 36' with Rw, cga/close sup-marked improvement in overall safety and stability, more fluid and confident gait pattern.  Recommend continued use of RW with all mobility efforts  at this time.   Assessment/Plan    PT Assessment Patient needs continued PT services  PT Problem List Decreased strength;Decreased activity tolerance;Decreased balance;Decreased mobility;Decreased knowledge of use of DME       PT Treatment Interventions DME instruction;Gait training;Stair training;Functional mobility training;Therapeutic activities;Therapeutic exercise;Balance training;Patient/family education    PT Goals (Current goals can be found in the Care Plan section)  Acute Rehab PT Goals Patient Stated Goal: to return home PT Goal Formulation: With patient/family Time For Goal Achievement: 11/17/16 Potential to Achieve Goals: Good    Frequency Min 2X/week   Barriers to discharge        Co-evaluation               AM-PAC PT "6 Clicks" Daily Activity  Outcome Measure Difficulty turning over in bed (including adjusting bedclothes, sheets and blankets)?: A Little Difficulty moving from lying on back to sitting on the side of the bed? : A Little Difficulty sitting down on and standing up from a chair with arms (e.g., wheelchair, bedside commode, etc,.)?: Total Help needed moving to and from a bed to chair (including a wheelchair)?: A Little Help needed walking in hospital room?: A Little Help needed climbing 3-5 steps with a railing? : A Lot 6 Click Score: 15    End of Session Equipment Utilized During Treatment: Gait belt Activity Tolerance: Patient tolerated treatment well Patient left: in chair;with call bell/phone within reach;with chair alarm set;with family/visitor present Nurse Communication: Mobility status PT Visit Diagnosis: Difficulty in walking, not elsewhere classified (R26.2);Muscle weakness (generalized) (M62.81)    Time: 6226-3335 PT Time Calculation (min) (ACUTE ONLY): 20 min   Charges:   PT Evaluation $PT Eval Low Complexity: 1 Procedure PT Treatments $Gait Training: 8-22 mins   PT G Codes:   PT G-Codes **NOT FOR INPATIENT  CLASS** Functional Assessment Tool Used: AM-PAC 6 Clicks Basic Mobility Functional Limitation: Mobility: Walking and moving around Mobility: Walking and Moving Around Current Status (K5625): At least 40 percent but less than 60 percent impaired, limited or restricted Mobility: Walking and Moving Around Goal Status [phone]): At least 1 percent but less than 20 percent impaired, limited or restricted    Calven Gilkes H. Owens Shark, PT, DPT, NCS 11/03/16, 10:47 AM [phone]

## 2016-11-03 NOTE — Discharge Instructions (Signed)
The symptoms he came to the emergency department with today are most consistent with either food poisoning or a viral gastroenteritis that does not require antibiotics.  Your CT today did show thickening of your rectum which is concerning for possible cancer.  Please follow up with your PMD by Monday for a recheck.  Return to the ED for any concerns.  It was a pleasure to take care of you today, and thank you for coming to our emergency department.  If you have any questions or concerns before leaving please ask the nurse to grab me and I'm more than happy to go through your aftercare instructions again.  If you were prescribed any opioid pain medication today such as Norco, Vicodin, Percocet, morphine, hydrocodone, or oxycodone please make sure you do not drive when you are taking this medication as it can alter your ability to drive safely.  If you have any concerns once you are home that you are not improving or are in fact getting worse before you can make it to your follow-up appointment, please do not hesitate to call 911 and come back for further evaluation.  Darel Hong MD  Results for orders placed or performed during the hospital encounter of 11/01/16  Lipase, blood  Result Value Ref Range   Lipase 43 11 - 51 U/L  Comprehensive metabolic panel  Result Value Ref Range   Sodium 137 135 - 145 mmol/L   Potassium 4.0 3.5 - 5.1 mmol/L   Chloride 103 101 - 111 mmol/L   CO2 24 22 - 32 mmol/L   Glucose, Bld 292 (H) 65 - 99 mg/dL   BUN 40 (H) 6 - 20 mg/dL   Creatinine, Ser 0.94 0.44 - 1.00 mg/dL   Calcium 9.6 8.9 - 10.3 mg/dL   Total Protein 7.6 6.5 - 8.1 g/dL   Albumin 4.3 3.5 - 5.0 g/dL   AST 35 15 - 41 U/L   ALT 31 14 - 54 U/L   Alkaline Phosphatase 56 38 - 126 U/L   Total Bilirubin 0.7 0.3 - 1.2 mg/dL   GFR calc non Af Amer 51 (L) >60 mL/min   GFR calc Af Amer 60 (L) >60 mL/min   Anion gap 10 5 - 15  CBC  Result Value Ref Range   WBC 14.3 (H) 3.6 - 11.0 K/uL   RBC 4.06 3.80  - 5.20 MIL/uL   Hemoglobin 11.7 (L) 12.0 - 16.0 g/dL   HCT 34.2 (L) 35.0 - 47.0 %   MCV 84.3 80.0 - 100.0 fL   MCH 28.9 26.0 - 34.0 pg   MCHC 34.2 32.0 - 36.0 g/dL   RDW 13.9 11.5 - 14.5 %   Platelets 273 150 - 440 K/uL  Urinalysis, Complete w Microscopic  Result Value Ref Range   Color, Urine YELLOW (A) YELLOW   APPearance CLEAR (A) CLEAR   Specific Gravity, Urine 1.016 1.005 - 1.030   pH 5.0 5.0 - 8.0   Glucose, UA >=500 (A) NEGATIVE mg/dL   Hgb urine dipstick NEGATIVE NEGATIVE   Bilirubin Urine NEGATIVE NEGATIVE   Ketones, ur NEGATIVE NEGATIVE mg/dL   Protein, ur 100 (A) NEGATIVE mg/dL   Nitrite NEGATIVE NEGATIVE   Leukocytes, UA MODERATE (A) NEGATIVE   RBC / HPF 0-5 0 - 5 RBC/hpf   WBC, UA 6-30 0 - 5 WBC/hpf   Bacteria, UA RARE (A) NONE SEEN   Squamous Epithelial / LPF 0-5 (A) NONE SEEN   Mucous PRESENT   Troponin I  Result Value Ref Range   Troponin I <0.03 <0.03 ng/mL  Lactic acid, plasma  Result Value Ref Range   Lactic Acid, Venous 1.7 0.5 - 1.9 mmol/L  CK  Result Value Ref Range   Total CK 89 38 - 234 U/L   Ct Abdomen Pelvis W Contrast  Result Date: 11/01/2016 CLINICAL DATA:  Abdominal pain, vomiting EXAM: CT ABDOMEN AND PELVIS WITH CONTRAST TECHNIQUE: Multidetector CT imaging of the abdomen and pelvis was performed using the standard protocol following bolus administration of intravenous contrast. CONTRAST:  33mL ISOVUE-300 IOPAMIDOL (ISOVUE-300) INJECTION 61% COMPARISON:  12/08/2013 FINDINGS: Lower chest: Small hiatal hernia. Heart is borderline in size. Calcified mitral valve annular valve. Patchy ground-glass airspace opacities in both lower lobes, right greater than left. No effusions. Hepatobiliary: No focal liver abnormality is seen. Status post cholecystectomy. No biliary dilatation. Pancreas: No focal abnormality or ductal dilatation. Spleen: No focal abnormality.  Normal size. Adrenals/Urinary Tract: Adrenal glands unremarkable. Numerous bilateral renal cysts.  No hydronephrosis. Urinary bladder unremarkable. Stomach/Bowel: Sigmoid diverticulosis. No active diverticulitis. Appendix is normal. Stomach and small bowel decompressed, unremarkable. There is suggestion of wall thickening/ enhancement along the left lateral rectal wall. Cannot exclude rectal mass (image 80). Recommend correlation with digital rectal exam and possibly colonoscopy. Vascular/Lymphatic: Aortic and iliac calcifications. No aneurysm or adenopathy. Reproductive: Prior hysterectomy.  No adnexal masses. Other: No free fluid or free air. Musculoskeletal: Degenerative changes throughout the lumbar spine. No acute bony abnormality. IMPRESSION: Possible left rectal wall thickening/ mass. Cannot exclude rectal cancer. Recommend correlation with physical exam and possible colonoscopy (or prior recent colonoscopy). Sigmoid diverticulosis. Small hiatal hernia. Electronically Signed   By: Rolm Baptise M.D.   On: 11/01/2016 07:50   Dg Chest Port 1 View  Result Date: 11/01/2016 CLINICAL DATA:  Abdominal pain, vomiting. EXAM: PORTABLE CHEST 1 VIEW COMPARISON:  08/30/2016 FINDINGS: Mild hyperinflation. Heart and mediastinal contours are within normal limits. No focal opacities or effusions. No acute bony abnormality. IMPRESSION: Hyperinflation.  No active cardiopulmonary disease. Electronically Signed   By: Rolm Baptise M.D.   On: 11/01/2016 07:43   HHPT. Heart healthy and ADA diet.

## 2016-11-03 NOTE — Progress Notes (Signed)
Family requested that Dr. Bridgett Larsson be notified that patient had another bout of watery diarrhea on the way to the bathroom. Dr. Bridgett Larsson acknowledge but did not want to change anything. [place] for patient to discharge.

## 2016-11-03 NOTE — Clinical Social Work Note (Signed)
CSW received referral for SNF.  Case discussed with case manager and plan is to discharge home with home health.  CSW to sign off please re-consult if social work needs arise.  Ariell Gunnels R. Raye Slyter, MSW, LCSWA [phone]  

## 2016-11-03 NOTE — Telephone Encounter (Signed)
yes

## 2016-11-06 ENCOUNTER — Other Ambulatory Visit: Payer: Self-pay | Admitting: Internal Medicine

## 2016-11-06 NOTE — Telephone Encounter (Signed)
Transition Care Management Follow-up Telephone Call  How have you been since you were released from the hospital? Feeling much better.   Do you understand why you were in the hospital? Yes   Do you understand the discharge instrcutions? yes  Items Reviewed:  Medications reviewed: Yes  Allergies reviewed: yes  Dietary changes reviewed: yes  Referrals reviewed: yes   Functional Questionnaire:   Activities of Daily Living (ADLs):   She states they are independent in the following: In all ADLs.  States they require assistance with the following: no assist needed.   Any transportation issues/concerns?:no   Any patient concerns? No   Confirmed importance and date/time of follow-up visits scheduled: Yes   Confirmed with patient if condition begins to worsen call PCP or go to the ER.  Patient was given the Call-a-Nurse line [phone].

## 2016-11-14 ENCOUNTER — Telehealth: Payer: Self-pay | Admitting: Internal Medicine

## 2016-11-14 MED ORDER — LOSARTAN POTASSIUM 100 MG PO TABS: 100.0000 mg | ORAL_TABLET | Freq: Every day | ORAL | 0 refills | Status: DC

## 2016-11-14 MED ORDER — SERTRALINE HCL 50 MG PO TABS: 50.0000 mg | ORAL_TABLET | Freq: Every day | ORAL | 0 refills | Status: DC

## 2016-11-14 NOTE — Telephone Encounter (Signed)
VALSARTAN IS CURRENTLY NOT AVAILABLE DUE TO RECALL ISSUES, SO I AM SUBSTITUTING  A SIMILAR MEDICATION  CALLED  LOSARTAN FOR HER AND ERNEST'S BLOOD PRESSURE MEDICATION

## 2016-11-15 NOTE — Telephone Encounter (Signed)
Spoke with pt's daughter and informed her of the message below. The daughter gave a verbal understanding and stated that they have already picked up the losartan and thrown all the valsartan away.

## 2016-11-16 ENCOUNTER — Ambulatory Visit (INDEPENDENT_AMBULATORY_CARE_PROVIDER_SITE_OTHER): Payer: Medicare Other | Admitting: Internal Medicine

## 2016-11-16 ENCOUNTER — Encounter: Payer: Self-pay | Admitting: Internal Medicine

## 2016-11-16 VITALS — BP 120/52 | HR 76 | Temp 98.6°F | Resp 16 | Ht 63.0 in | Wt 122.4 lb

## 2016-11-16 DIAGNOSIS — K6289 Other specified diseases of anus and rectum: Secondary | ICD-10-CM

## 2016-11-16 DIAGNOSIS — K629 Disease of anus and rectum, unspecified: Secondary | ICD-10-CM

## 2016-11-16 DIAGNOSIS — F411 Generalized anxiety disorder: Secondary | ICD-10-CM | POA: Diagnosis not present

## 2016-11-16 DIAGNOSIS — E784 Other hyperlipidemia: Secondary | ICD-10-CM

## 2016-11-16 DIAGNOSIS — D508 Other iron deficiency anemias: Secondary | ICD-10-CM | POA: Diagnosis not present

## 2016-11-16 DIAGNOSIS — E1121 Type 2 diabetes mellitus with diabetic nephropathy: Secondary | ICD-10-CM | POA: Diagnosis not present

## 2016-11-16 DIAGNOSIS — I2510 Atherosclerotic heart disease of native coronary artery without angina pectoris: Secondary | ICD-10-CM | POA: Diagnosis not present

## 2016-11-16 DIAGNOSIS — R933 Abnormal findings on diagnostic imaging of other parts of digestive tract: Secondary | ICD-10-CM

## 2016-11-16 DIAGNOSIS — I2584 Coronary atherosclerosis due to calcified coronary lesion: Secondary | ICD-10-CM | POA: Diagnosis not present

## 2016-11-16 DIAGNOSIS — E7849 Other hyperlipidemia: Secondary | ICD-10-CM

## 2016-11-16 NOTE — Progress Notes (Signed)
Subjective:  Patient ID: Maureen Morales, female    DOB: [birth year]  Age: 83 y.o. MRN: [id number]  CC: The primary encounter diagnosis was Rectal mass. Diagnoses of Type II diabetes mellitus with nephropathy ([place]), Other iron deficiency anemia, Abnormal CT scan, colon, Generalized anxiety disorder, and Other hyperlipidemia were also pertinent to this visit.  HPI Maureen Morales presents for follow up on recent hospitalization for dehydration  Secondary to Rotavirus infection.  She was admitted on July 4 and discharged on July 6.  During hospitalization a CT abdomen and pelvis noted rectal wall thickening.  Concern for a rectal mass was raised,  And Dr Allen Norris was consulted,  But workup was deferred by family.   Patient has recovered fully since her hospitalization . Appetite has improved and her stools have been less frequent and mostly formed.   Lab Results  Component Value Date   HGBA1C 7.5 (H) 08/31/2016      Outpatient Medications Prior to Visit  Medication Sig Dispense Refill  . BD MICROTAINER LANCETS MISC Contact  Activated lancet 1.8 mm x 21 g Use once daily to check blood sugars 100 each 1  . clopidogrel (PLAVIX) 75 MG tablet Take 1 tablet (75 mg total) by mouth daily with breakfast. 30 tablet 0  . fenofibrate 160 MG tablet TAKE 1 TABLET BY MOUTH DAILY FOR HIGH TRIGLYCERIDES 30 tablet 3  . glucose blood (BAYER CONTOUR TEST) test strip Use as instructed 100 each 5  . loratadine (CLARITIN) 10 MG tablet Take 10 mg by mouth daily.    [name] losartan (COZAAR) 100 MG tablet Take 1 tablet (100 mg total) by mouth daily. 90 tablet 0  . metFORMIN (GLUCOPHAGE-XR) 750 MG 24 hr tablet TAKE 1 TABLET BY MOUTH DAILY WITH BREAKFAST 30 tablet 3  . metoprolol succinate (TOPROL-XL) 50 MG 24 hr tablet TAKE 1 TABLET BY MOUTH DAILY 30 tablet 3  . mupirocin ointment (BACTROBAN) 2 % Apply 1 application topically 2 (two) times daily. To left earlobe 22 g 0  . nitroGLYCERIN (NITROSTAT) 0.4 MG SL tablet Place 1  tablet (0.4 mg total) under the tongue every 5 (five) minutes as needed for chest pain. 10 tablet 0  . ondansetron (ZOFRAN ODT) 4 MG disintegrating tablet Take 1 tablet (4 mg total) by mouth every 8 (eight) hours as needed for nausea or vomiting. 20 tablet 0  . sertraline (ZOLOFT) 50 MG tablet Take 1 tablet (50 mg total) by mouth daily. AFTER DINNER 90 tablet 0  . amLODipine (NORVASC) 5 MG tablet TAKE 1 TABLET BY MOUTH DAILY (Patient not taking: Reported on 11/16/2016) 30 tablet 3  . atorvastatin (LIPITOR) 40 MG tablet TAKE 1 TABLET BY MOUTH DAILY AT 6PM (Patient not taking: Reported on 11/16/2016) 30 tablet 3  . calcium carbonate (TUMS) 500 MG chewable tablet Chew 1 tablet by mouth. Taken when needed    . pantoprazole (PROTONIX) 40 MG tablet Take 40 mg by mouth daily.    [name] levofloxacin (LEVAQUIN) 250 MG tablet Take 1 tablet (250 mg total) by mouth daily. (Patient not taking: Reported on 11/16/2016) 3 tablet 0  . valsartan (DIOVAN) 320 MG tablet TAKE 1 TABLET BY MOUTH DAILY (Patient not taking: Reported on 11/16/2016) 30 tablet 3   No facility-administered medications prior to visit.     Review of Systems;  Patient denies headache, fevers, malaise, unintentional weight loss, skin rash, eye pain, sinus congestion and sinus pain, sore throat, dysphagia,  hemoptysis , cough, dyspnea, wheezing, chest pain, palpitations,  orthopnea, edema, abdominal pain, nausea, melena, diarrhea, constipation, flank pain, dysuria, hematuria, urinary  Frequency, nocturia, numbness, tingling, seizures,  Focal weakness, Loss of consciousness,  Tremor, insomnia, depression, anxiety, and suicidal ideation.      Objective:  BP (!) 120/52 (BP Location: Left Arm, Patient Position: Sitting, Cuff Size: Normal)   Pulse 76   Temp 98.6 F (37 C) (Oral)   Resp 16   Ht 5\' 3"  (1.6 m)   Wt 122 lb 6.4 oz (55.5 kg)   SpO2 98%   BMI 21.68 kg/m   BP Readings from Last 3 Encounters:  11/16/16 (!) 120/52  11/03/16 (!) 134/45    09/26/16 140/60    Wt Readings from Last 3 Encounters:  11/16/16 122 lb 6.4 oz (55.5 kg)  11/02/16 134 lb 12.8 oz (61.1 kg)  09/26/16 124 lb 8 oz (56.5 kg)    General appearance: alert, cooperative and appears stated age Ears: normal TM's and external ear canals both ears Throat: lips, mucosa, and tongue normal; teeth and gums normal Neck: no adenopathy, no carotid bruit, supple, symmetrical, trachea midline and thyroid not enlarged, symmetric, no tenderness/mass/nodules Back: symmetric, no curvature. ROM normal. No CVA tenderness. Lungs: clear to auscultation bilaterally Heart: regular rate and rhythm, S1, S2 normal, no murmur, click, rub or gallop Abdomen: soft, non-tender; bowel sounds normal; no masses,  no organomegaly Pulses: 2+ and symmetric Skin: Skin color, texture, turgor normal. No rashes or lesions Lymph nodes: Cervical, supraclavicular, and axillary nodes normal.  Lab Results  Component Value Date   HGBA1C 7.5 (H) 08/31/2016   HGBA1C 8.3 (H) 07/06/2016   HGBA1C 7.0 (H) 11/29/2015    Lab Results  Component Value Date   CREATININE 1.05 (H) 11/03/2016   CREATININE 1.19 (H) 11/02/2016   CREATININE 0.94 11/01/2016    Lab Results  Component Value Date   WBC 8.2 11/02/2016   HGB 9.7 (L) 11/02/2016   HCT 28.8 (L) 11/02/2016   PLT 217 11/02/2016   GLUCOSE 107 (H) 11/03/2016   CHOL 115 10/12/2016   TRIG 108 10/12/2016   HDL 43 (L) 10/12/2016   LDLDIRECT 92.0 07/06/2016   LDLCALC 50 10/12/2016   ALT 31 11/01/2016   AST 35 11/01/2016   NA 137 11/03/2016   K 3.5 11/03/2016   CL 114 (H) 11/03/2016   CREATININE 1.05 (H) 11/03/2016   BUN 19 11/03/2016   CO2 18 (L) 11/03/2016   TSH 3.76 08/27/2015   INR 1.13 08/30/2016   HGBA1C 7.5 (H) 08/31/2016   MICROALBUR 30.9 (H) 07/06/2016    Dg Chest 2 View  Result Date: 11/02/2016 CLINICAL DATA:  Aspiration. EXAM: CHEST  2 VIEW COMPARISON:  11/01/2016. FINDINGS: Mediastinum and hilar structures are normal. Low lung  volumes with mild basilar atelectasis. Mild bibasilar infiltrates. No pleural effusion or pneumothorax. Heart size normal. No acute bony abnormalities. IMPRESSION: Low lung volumes with mild bibasilar atelectasis. Mild bibasilar infiltrates. Electronically Signed   By: Marcello Moores  Register   On: 11/02/2016 06:37   Ct Abdomen Pelvis W Contrast  Result Date: 11/01/2016 CLINICAL DATA:  Abdominal pain, vomiting EXAM: CT ABDOMEN AND PELVIS WITH CONTRAST TECHNIQUE: Multidetector CT imaging of the abdomen and pelvis was performed using the standard protocol following bolus administration of intravenous contrast. CONTRAST:  47mL ISOVUE-300 IOPAMIDOL (ISOVUE-300) INJECTION 61% COMPARISON:  12/08/2013 FINDINGS: Lower chest: Small hiatal hernia. Heart is borderline in size. Calcified mitral valve annular valve. Patchy ground-glass airspace opacities in both lower lobes, right greater than left. No effusions. Hepatobiliary: No  focal liver abnormality is seen. Status post cholecystectomy. No biliary dilatation. Pancreas: No focal abnormality or ductal dilatation. Spleen: No focal abnormality.  Normal size. Adrenals/Urinary Tract: Adrenal glands unremarkable. Numerous bilateral renal cysts. No hydronephrosis. Urinary bladder unremarkable. Stomach/Bowel: Sigmoid diverticulosis. No active diverticulitis. Appendix is normal. Stomach and small bowel decompressed, unremarkable. There is suggestion of wall thickening/ enhancement along the left lateral rectal wall. Cannot exclude rectal mass (image 80). Recommend correlation with digital rectal exam and possibly colonoscopy. Vascular/Lymphatic: Aortic and iliac calcifications. No aneurysm or adenopathy. Reproductive: Prior hysterectomy.  No adnexal masses. Other: No free fluid or free air. Musculoskeletal: Degenerative changes throughout the lumbar spine. No acute bony abnormality. IMPRESSION: Possible left rectal wall thickening/ mass. Cannot exclude rectal cancer. Recommend  correlation with physical exam and possible colonoscopy (or prior recent colonoscopy). Sigmoid diverticulosis. Small hiatal hernia. Electronically Signed   By: Rolm Baptise M.D.   On: 11/01/2016 07:50   Dg Chest Port 1 View  Result Date: 11/01/2016 CLINICAL DATA:  Abdominal pain, vomiting. EXAM: PORTABLE CHEST 1 VIEW COMPARISON:  08/30/2016 FINDINGS: Mild hyperinflation. Heart and mediastinal contours are within normal limits. No focal opacities or effusions. No acute bony abnormality. IMPRESSION: Hyperinflation.  No active cardiopulmonary disease. Electronically Signed   By: Rolm Baptise M.D.   On: 11/01/2016 07:43    Assessment & Plan:   Problem List Items Addressed This Visit    Abnormal CT scan, colon    Rectal wall thickening noted in the setting of infectious colitis., but concern for rectal mass was discussed  Given her history of IDA for the past 2-3 years.  repeat CT ordered       Anemia, iron deficiency   Relevant Orders   CBC with Differential/Platelet   Generalized anxiety disorder    Tolerating zoloft for "hypertension."  Dose increased to 50 mg      Hyperlipidemia    LDL is 50  with atorvastatin started after hospitalization for STEMI. LFTs are normal.  Lab Results  Component Value Date   CHOL 115 10/12/2016   HDL 43 (L) 10/12/2016   LDLCALC 50 10/12/2016   LDLDIRECT 92.0 07/06/2016   TRIG 108 10/12/2016   CHOLHDL 2.7 10/12/2016         Type II diabetes mellitus with nephropathy (H[facility])   Relevant Orders   Comprehensive metabolic panel   Hemoglobin A1c   Lipid panel    Other Visit Diagnoses    Rectal mass    -  Primary   Relevant Orders   CT ABDOMEN PELVIS W WO CONTRAST      I have discontinued Ms. Bondarenko valsartan and levofloxacin. I am also having her maintain her calcium carbonate, glucose blood, loratadine, BD MICROTAINER LANCETS, mupirocin ointment, clopidogrel, nitroGLYCERIN, atorvastatin, metoprolol succinate, amLODipine, metFORMIN, ondansetron,  pantoprazole, fenofibrate, losartan, and sertraline.  No orders of the defined types were placed in this encounter.   Medications Discontinued During This Encounter  Medication Reason  . levofloxacin (LEVAQUIN) 250 MG tablet Therapy completed  . valsartan (DIOVAN) 320 MG tablet Patient has not taken in last 30 days    Follow-up: Return for lab visit augst 4th .   [name], MD

## 2016-11-19 NOTE — Assessment & Plan Note (Signed)
Tolerating zoloft for "hypertension."  Dose increased to 50 mg

## 2016-11-19 NOTE — Assessment & Plan Note (Signed)
LDL is 50  with atorvastatin started after hospitalization for STEMI. LFTs are normal.  Lab Results  Component Value Date   CHOL 115 10/12/2016   HDL 43 (L) 10/12/2016   LDLCALC 50 10/12/2016   LDLDIRECT 92.0 07/06/2016   TRIG 108 10/12/2016   CHOLHDL 2.7 10/12/2016

## 2016-11-19 NOTE — Assessment & Plan Note (Signed)
Rectal wall thickening noted in the setting of infectious colitis., but concern for rectal mass was discussed  Given her history of IDA for the past 2-3 years.  repeat CT ordered

## 2016-11-23 DIAGNOSIS — L821 Other seborrheic keratosis: Secondary | ICD-10-CM | POA: Diagnosis not present

## 2016-11-23 DIAGNOSIS — L718 Other rosacea: Secondary | ICD-10-CM | POA: Diagnosis not present

## 2016-11-23 DIAGNOSIS — L304 Erythema intertrigo: Secondary | ICD-10-CM | POA: Diagnosis not present

## 2016-11-23 DIAGNOSIS — D1801 Hemangioma of skin and subcutaneous tissue: Secondary | ICD-10-CM | POA: Diagnosis not present

## 2016-11-27 ENCOUNTER — Other Ambulatory Visit: Payer: Self-pay | Admitting: Internal Medicine

## 2016-11-27 ENCOUNTER — Ambulatory Visit
Admission: RE | Admit: 2016-11-27 | Discharge: 2016-11-27 | Disposition: A | Discharge status: Home / Self Care | Payer: Medicare Other | Source: Ambulatory Visit | Attending: Internal Medicine | Admitting: Internal Medicine

## 2016-11-27 DIAGNOSIS — K6289 Other specified diseases of anus and rectum: Secondary | ICD-10-CM

## 2016-11-27 DIAGNOSIS — N8110 Cystocele, unspecified: Secondary | ICD-10-CM | POA: Diagnosis not present

## 2016-11-27 DIAGNOSIS — K629 Disease of anus and rectum, unspecified: Secondary | ICD-10-CM | POA: Diagnosis not present

## 2016-11-27 DIAGNOSIS — M1288 Other specific arthropathies, not elsewhere classified, other specified site: Secondary | ICD-10-CM | POA: Diagnosis not present

## 2016-11-27 DIAGNOSIS — I700 Atherosclerosis of aorta: Secondary | ICD-10-CM | POA: Diagnosis not present

## 2016-11-27 DIAGNOSIS — M4316 Spondylolisthesis, lumbar region: Secondary | ICD-10-CM | POA: Diagnosis not present

## 2016-11-27 DIAGNOSIS — N8189 Other female genital prolapse: Secondary | ICD-10-CM | POA: Diagnosis not present

## 2016-11-27 DIAGNOSIS — F411 Generalized anxiety disorder: Secondary | ICD-10-CM

## 2016-11-27 MED ORDER — AMLODIPINE BESYLATE 5 MG PO TABS: 5.0000 mg | ORAL_TABLET | Freq: Every day | ORAL | 3 refills | Status: DC

## 2016-11-27 MED ORDER — IOPAMIDOL (ISOVUE-300) INJECTION 61%
75.0000 mL | Freq: Once | INTRAVENOUS | Status: AC | PRN
  Administered 2016-11-27: 75 mL via INTRAVENOUS

## 2016-11-28 ENCOUNTER — Ambulatory Visit (INDEPENDENT_AMBULATORY_CARE_PROVIDER_SITE_OTHER): Payer: Medicare Other | Admitting: General Surgery

## 2016-11-28 ENCOUNTER — Encounter: Payer: Self-pay | Admitting: General Surgery

## 2016-11-28 VITALS — BP 132/74 | HR 84 | Resp 13 | Ht 63.0 in | Wt 124.0 lb

## 2016-11-28 DIAGNOSIS — A09 Infectious gastroenteritis and colitis, unspecified: Secondary | ICD-10-CM | POA: Diagnosis not present

## 2016-11-28 DIAGNOSIS — R938 Abnormal findings on diagnostic imaging of other specified body structures: Secondary | ICD-10-CM

## 2016-11-28 DIAGNOSIS — I2510 Atherosclerotic heart disease of native coronary artery without angina pectoris: Secondary | ICD-10-CM | POA: Diagnosis not present

## 2016-11-28 DIAGNOSIS — R9389 Abnormal findings on diagnostic imaging of other specified body structures: Secondary | ICD-10-CM

## 2016-11-28 DIAGNOSIS — I2584 Coronary atherosclerosis due to calcified coronary lesion: Secondary | ICD-10-CM | POA: Diagnosis not present

## 2016-11-28 NOTE — Progress Notes (Signed)
Patient ID: Maureen Morales, female   DOB: 05/26/1922, 83 y.o.   MRN: [id number]  Chief Complaint  Patient presents with  . Rectal Problems    HPI Maureen Morales is a 83 y.o. female.  Here today for rectal evaluation. She denies any abdominal pain. She states she had the roto virus one month ago with diarrhea and vomiting and went to the ED.The patient had had no difficulty with her bowels prior to the recent viral infection outside of that related to what she had thought her hemorrhoids. Stool frequency has improved markedly since early July when she developed a rotavirus infection. She does have hemorrhoids that bleed on occasion. Her bowels move daily. CT scan 11-01-16 and 11-27-16. She is here with her daughter, Cienna Dumais.   HPI  Past Medical History:  Diagnosis Date  . CAD (coronary artery disease)   . Diabetes mellitus   . Hyperlipidemia   . Hypertension   . Myocardial infarction ([place]) 2018  . Viral gastroenteritis due to [place]-like agent Nov 2012    Past Surgical History:  Procedure Laterality Date  . ABDOMINAL HYSTERECTOMY    . BLADDER SURGERY    . CARDIAC CATHETERIZATION    . CHOLECYSTECTOMY    . LEFT HEART CATH AND CORONARY ANGIOGRAPHY N/A 08/31/2016   Procedure: Left Heart Cath and Coronary Angiography;  Surgeon: Wellington Hampshire, MD;  Location: [facility];  Service: Cardiovascular;  Laterality: N/A;    Family History  Problem Relation Age of Onset  . Cancer Mother   . Breast cancer Mother 32  . Breast cancer Sister 35  . Colon cancer Brother   . Colon cancer Brother     Social History Social History  Substance Use Topics  . Smoking status: Never Smoker  . Smokeless tobacco: Never Used  . Alcohol use No    Allergies  Allergen Reactions  . Aspirin   . Atropine   . Belladonna Alkaloids   . Codeine   . Darvon   . Methocarbamol   . Penicillins   . Sulfa Antibiotics   . Iron Other (See Comments)    High dose prescription gives her diarrhea     Current Outpatient Prescriptions  Medication Sig Dispense Refill  . amLODipine (NORVASC) 5 MG tablet Take 1 tablet (5 mg total) by mouth daily. 30 tablet 3  . atorvastatin (LIPITOR) 40 MG tablet TAKE 1 TABLET BY MOUTH DAILY AT 6PM 30 tablet 3  . BD MICROTAINER LANCETS MISC Contact  Activated lancet 1.8 mm x 21 g Use once daily to check blood sugars 100 each 1  . calcium carbonate (TUMS) 500 MG chewable tablet Chew 1 tablet by mouth. Taken when needed    . clopidogrel (PLAVIX) 75 MG tablet Take 1 tablet (75 mg total) by mouth daily with breakfast. 30 tablet 0  . fenofibrate 160 MG tablet TAKE 1 TABLET BY MOUTH DAILY FOR HIGH TRIGLYCERIDES 30 tablet 3  . glucose blood (BAYER CONTOUR TEST) test strip Use as instructed 100 each 5  . loratadine (CLARITIN) 10 MG tablet Take 10 mg by mouth daily.    [name] losartan (COZAAR) 100 MG tablet Take 1 tablet (100 mg total) by mouth daily. 90 tablet 0  . metFORMIN (GLUCOPHAGE-XR) 750 MG 24 hr tablet TAKE 1 TABLET BY MOUTH DAILY WITH BREAKFAST 30 tablet 3  . metoprolol succinate (TOPROL-XL) 50 MG 24 hr tablet TAKE 1 TABLET BY MOUTH DAILY 30 tablet 3  . mupirocin ointment (BACTROBAN) 2 %  Apply 1 application topically 2 (two) times daily. To left earlobe 22 g 0  . nitroGLYCERIN (NITROSTAT) 0.4 MG SL tablet Place 1 tablet (0.4 mg total) under the tongue every 5 (five) minutes as needed for chest pain. 10 tablet 0  . ondansetron (ZOFRAN ODT) 4 MG disintegrating tablet Take 1 tablet (4 mg total) by mouth every 8 (eight) hours as needed for nausea or vomiting. 20 tablet 0  . pantoprazole (PROTONIX) 40 MG tablet Take 40 mg by mouth daily.    . sertraline (ZOLOFT) 50 MG tablet Take 1 tablet (50 mg total) by mouth daily. AFTER DINNER 90 tablet 0   No current facility-administered medications for this visit.     Review of Systems Review of Systems  Constitutional: Negative.   Respiratory: Negative.   Cardiovascular: Negative.   Gastrointestinal: Positive for anal  bleeding. Negative for abdominal pain and rectal pain.    Blood pressure 132/74, pulse 84, resp. rate 13, height 5\' 3"  (1.6 m), weight 124 lb (56.2 kg).  Physical Exam Physical Exam  Constitutional: She is oriented to person, place, and time. She appears well-developed and well-nourished.  HENT:  Mouth/Throat: Oropharynx is clear and moist.  Eyes: Conjunctivae are normal. No scleral icterus.  Neck: Neck supple.  Cardiovascular: Normal rate and regular rhythm.   Murmur heard.  Systolic murmur is present with a grade of 3/6  Pulmonary/Chest: Effort normal and breath sounds normal.  Abdominal: Soft. Bowel sounds are normal. There is no hepatosplenomegaly. There is no tenderness. No hernia.  Genitourinary: Rectum normal. Rectal exam shows no mass, anal tone normal and guaiac negative stool.  Genitourinary Comments: Digital rectal exam was undertaken with minimal discomfort. Palpation within the reach of the examining finger, 7 cm, showed no discernible mass, thickening or tenderness.  Lymphadenopathy:    She has no cervical adenopathy.       Right: No inguinal adenopathy present.       Left: No inguinal adenopathy present.  Neurological: She is alert and oriented to person, place, and time.  Skin: Skin is warm and dry.  Psychiatric: Her behavior is normal.    Data Reviewed Contrast enhanced CT scan of 11/27/2016 was compared to a noncontrast CT of 12/08/2013.  Eccentric, regular wall thickening noted on the lateral aspect of the distal rectum just above the anal verge. Concerning for rectal neoplasia. Alternative possibility was telescoping and crowding of the normal rectal mucosa secondary to the patient's underlying pelvic floor laxity.  The 2015 CT shows a similar appearance with a shift of the stool column to the right, although somewhat difficult compared directly without intraluminal contrast present.  Sigmoidoscopy, unprepped, was completed to 12 cm, above the first rectal  valve. No mucosal lesions were identified.  Assessment    Abnormal imaging without evidence of mucosal abnormality or subcutaneous mass to palpation.    Plan    Observation alone at present.    This information was conveyed by phone to the patient's primary physician.  HPI, Physical Exam, Assessment and Plan have been scribed under the direction and in the presence of Robert Bellow, MD. Karie Fetch, RN  I have completed the exam and reviewed the above documentation for accuracy and completeness.  I agree with the above.  Haematologist has been used and any errors in dictation or transcription are unintentional.  Hervey Ard, M.D., F.A.C.S.  Robert Bellow 11/28/2016, 5:19 PM

## 2016-11-28 NOTE — Patient Instructions (Signed)
The patient is aware to call back for any questions or concerns.  

## 2016-11-29 ENCOUNTER — Ambulatory Visit: Payer: Medicare Other | Admitting: Internal Medicine

## 2016-11-29 LAB — POC HEMOCCULT BLD/STL (OFFICE/1-CARD/DIAGNOSTIC): FECAL OCCULT BLD: NEGATIVE

## 2016-12-04 ENCOUNTER — Other Ambulatory Visit (INDEPENDENT_AMBULATORY_CARE_PROVIDER_SITE_OTHER): Payer: Medicare Other

## 2016-12-04 DIAGNOSIS — E1121 Type 2 diabetes mellitus with diabetic nephropathy: Secondary | ICD-10-CM | POA: Diagnosis not present

## 2016-12-04 DIAGNOSIS — D508 Other iron deficiency anemias: Secondary | ICD-10-CM

## 2016-12-04 LAB — CBC WITH DIFFERENTIAL/PLATELET
BASOS ABS: 0.1 10*3/uL (ref 0.0–0.1)
Basophils Relative: 1.1 % (ref 0.0–3.0)
EOS ABS: 0.3 10*3/uL (ref 0.0–0.7)
Eosinophils Relative: 4.4 % (ref 0.0–5.0)
HEMATOCRIT: 31.3 % — AB (ref 36.0–46.0)
Hemoglobin: 10.4 g/dL — ABNORMAL LOW (ref 12.0–15.0)
LYMPHS PCT: 18.3 % (ref 12.0–46.0)
Lymphs Abs: 1.3 10*3/uL (ref 0.7–4.0)
MCHC: 33.1 g/dL (ref 30.0–36.0)
MCV: 87.9 fl (ref 78.0–100.0)
MONOS PCT: 8.9 % (ref 3.0–12.0)
Monocytes Absolute: 0.6 10*3/uL (ref 0.1–1.0)
NEUTROS ABS: 4.7 10*3/uL (ref 1.4–7.7)
Neutrophils Relative %: 67.3 % (ref 43.0–77.0)
PLATELETS: 269 10*3/uL (ref 150.0–400.0)
RBC: 3.55 Mil/uL — AB (ref 3.87–5.11)
RDW: 14.3 % (ref 11.5–15.5)
WBC: 6.9 10*3/uL (ref 4.0–10.5)

## 2016-12-04 LAB — LIPID PANEL
CHOL/HDL RATIO: 3
Cholesterol: 132 mg/dL (ref 0–200)
HDL: 38.3 mg/dL — ABNORMAL LOW (ref >39.00)
LDL CALC: 68 mg/dL (ref 0–99)
NONHDL: 93.63
TRIGLYCERIDES: 128 mg/dL (ref 0.0–149.0)
VLDL: 25.6 mg/dL (ref 0.0–40.0)

## 2016-12-04 LAB — COMPREHENSIVE METABOLIC PANEL
ALK PHOS: 43 U/L (ref 39–117)
ALT: 14 U/L (ref 0–35)
AST: 13 U/L (ref 0–37)
Albumin: 4 g/dL (ref 3.5–5.2)
BILIRUBIN TOTAL: 0.4 mg/dL (ref 0.2–1.2)
BUN: 42 mg/dL — AB (ref 6–23)
CO2: 24 meq/L (ref 19–32)
Calcium: 9.6 mg/dL (ref 8.4–10.5)
Chloride: 109 mEq/L (ref 96–112)
Creatinine, Ser: 1.13 mg/dL (ref 0.40–1.20)
GFR: 47.89 mL/min — AB (ref >60.00)
GLUCOSE: 138 mg/dL — AB (ref 70–99)
Potassium: 4.5 mEq/L (ref 3.5–5.1)
Sodium: 139 mEq/L (ref 135–145)
TOTAL PROTEIN: 6.6 g/dL (ref 6.0–8.3)

## 2016-12-04 LAB — HEMOGLOBIN A1C: Hgb A1c MFr Bld: 7 % — ABNORMAL HIGH (ref 4.6–6.5)

## 2016-12-06 ENCOUNTER — Telehealth: Payer: Self-pay | Admitting: Cardiovascular Disease

## 2016-12-06 NOTE — Telephone Encounter (Signed)
Pt is having dental surgery tomorrow and needs clearance.

## 2016-12-06 NOTE — Telephone Encounter (Signed)
Returned call to [place] at Dr. Allen Kell dental office. Dr Hampton Abbot once to make sure patient is cleared from a cardiology standpoint to have a dental extraction and dental implant placement. Dr Hampton Abbot will be using local anesthetic and patient may continue her normal medications including plavix. Patient does not need to stop plavix per Dr Hampton Abbot.  They also want to make sure if patient needs antibiotic prophylaxis.  Patient had Left heart cath and coronary angiography on 08/31/16. Patient's appointment is tomorrow, 12/07/16 at 3 pm. Routing to Dr Fletcher Anon for clearance.

## 2016-12-06 NOTE — Telephone Encounter (Signed)
She can proceed at low risk. She can hold Plavix 5 days before the procedure.

## 2016-12-07 NOTE — Telephone Encounter (Signed)
Spoke with Kirtland Bouchard at dentist office. Gave him the recommendation. He verbalized understanding. I will also fax this encounter to [phone] for them via EP[employer].

## 2016-12-25 ENCOUNTER — Other Ambulatory Visit: Payer: Self-pay | Admitting: Internal Medicine

## 2016-12-25 ENCOUNTER — Ambulatory Visit (INDEPENDENT_AMBULATORY_CARE_PROVIDER_SITE_OTHER): Payer: Medicare Other

## 2016-12-25 DIAGNOSIS — M79672 Pain in left foot: Secondary | ICD-10-CM | POA: Diagnosis not present

## 2016-12-25 DIAGNOSIS — M25572 Pain in left ankle and joints of left foot: Secondary | ICD-10-CM | POA: Diagnosis not present

## 2016-12-25 DIAGNOSIS — M7989 Other specified soft tissue disorders: Secondary | ICD-10-CM | POA: Diagnosis not present

## 2016-12-25 NOTE — Progress Notes (Unsigned)
Dg ank

## 2016-12-27 ENCOUNTER — Ambulatory Visit
Admission: RE | Admit: 2016-12-27 | Discharge: 2016-12-27 | Disposition: A | Discharge status: Home / Self Care | Payer: Medicare Other | Source: Ambulatory Visit | Attending: Podiatry | Admitting: Podiatry

## 2016-12-27 ENCOUNTER — Other Ambulatory Visit: Payer: Self-pay | Admitting: Podiatry

## 2016-12-27 DIAGNOSIS — M79662 Pain in left lower leg: Secondary | ICD-10-CM

## 2016-12-27 DIAGNOSIS — M659 Synovitis and tenosynovitis, unspecified: Secondary | ICD-10-CM | POA: Diagnosis not present

## 2016-12-27 DIAGNOSIS — M7989 Other specified soft tissue disorders: Secondary | ICD-10-CM | POA: Insufficient documentation

## 2016-12-27 DIAGNOSIS — R600 Localized edema: Secondary | ICD-10-CM

## 2016-12-27 DIAGNOSIS — M79605 Pain in left leg: Secondary | ICD-10-CM | POA: Insufficient documentation

## 2016-12-31 ENCOUNTER — Emergency Department
Admission: EM | Admit: 2016-12-31 | Discharge: 2016-12-31 | Disposition: A | Discharge status: Home / Self Care | Payer: Medicare Other | Attending: Emergency Medicine | Admitting: Emergency Medicine

## 2016-12-31 ENCOUNTER — Emergency Department: Payer: Medicare Other

## 2016-12-31 DIAGNOSIS — I2510 Atherosclerotic heart disease of native coronary artery without angina pectoris: Secondary | ICD-10-CM | POA: Diagnosis not present

## 2016-12-31 DIAGNOSIS — Z7984 Long term (current) use of oral hypoglycemic drugs: Secondary | ICD-10-CM | POA: Diagnosis not present

## 2016-12-31 DIAGNOSIS — S4992XA Unspecified injury of left shoulder and upper arm, initial encounter: Secondary | ICD-10-CM | POA: Diagnosis not present

## 2016-12-31 DIAGNOSIS — Y999 Unspecified external cause status: Secondary | ICD-10-CM | POA: Diagnosis not present

## 2016-12-31 DIAGNOSIS — E1121 Type 2 diabetes mellitus with diabetic nephropathy: Secondary | ICD-10-CM | POA: Diagnosis not present

## 2016-12-31 DIAGNOSIS — Z79899 Other long term (current) drug therapy: Secondary | ICD-10-CM | POA: Insufficient documentation

## 2016-12-31 DIAGNOSIS — S46912A Strain of unspecified muscle, fascia and tendon at shoulder and upper arm level, left arm, initial encounter: Secondary | ICD-10-CM

## 2016-12-31 DIAGNOSIS — J45909 Unspecified asthma, uncomplicated: Secondary | ICD-10-CM | POA: Insufficient documentation

## 2016-12-31 DIAGNOSIS — Y929 Unspecified place or not applicable: Secondary | ICD-10-CM | POA: Diagnosis not present

## 2016-12-31 DIAGNOSIS — Y9301 Activity, walking, marching and hiking: Secondary | ICD-10-CM | POA: Diagnosis not present

## 2016-12-31 DIAGNOSIS — W010XXA Fall on same level from slipping, tripping and stumbling without subsequent striking against object, initial encounter: Secondary | ICD-10-CM | POA: Insufficient documentation

## 2016-12-31 DIAGNOSIS — I10 Essential (primary) hypertension: Secondary | ICD-10-CM | POA: Insufficient documentation

## 2016-12-31 DIAGNOSIS — S46911A Strain of unspecified muscle, fascia and tendon at shoulder and upper arm level, right arm, initial encounter: Secondary | ICD-10-CM | POA: Diagnosis not present

## 2016-12-31 NOTE — ED Notes (Signed)
Pt. Shoulder swollen, pt. Left pedal pulse +1, right pedal pulse +2.

## 2016-12-31 NOTE — Discharge Instructions (Signed)
Take tylenol if needed. Follow up with orthopedics for symptoms that are not improving over the week. Return to the ER for symptoms that change or worsen if you are unable to see your primary care provider or the orthopedic doctor.

## 2016-12-31 NOTE — ED Provider Notes (Signed)
[facility] Emergency Department Provider Note ____________________________________________  Time seen: Approximately 8:11 AM  I have reviewed the triage vital signs and the nursing notes.   HISTORY  Chief Complaint Shoulder Pain    HPI RANIYA GOLEMBESKI is a 83 y.o. female who presents to the emergency department for evaluation and treatment of left shoulder pain. She states that she tripped while wearing an orthopedic boot that was placed on her leg by Dr. Cleda Mccreedy. She landed on her left shoulder. Incident occurred at her home yesterday afternoon. She denies new complaint or worsening of symptoms she has been experiencing with her left lower extremity. She denies loss of consciousness or striking her head.  Past Medical History:  Diagnosis Date  . CAD (coronary artery disease)   . Diabetes mellitus   . Hyperlipidemia   . Hypertension   . Myocardial infarction ([place]) 2018  . Viral gastroenteritis due to [place]-like agent Nov 2012    Patient Active Problem List   Diagnosis Date Noted  . Diarrhea of infectious origin 11/28/2016  . Abnormal CT scan, colon   . Generalized anxiety disorder 09/07/2016  . Unsteady gait 09/07/2016  . Hospital discharge follow-up 09/07/2016  . NSTEMI (non-ST elevated myocardial infarction) ([place]) 08/30/2016  . Pain in joint, ankle and foot 06/24/2014  . S/P laparoscopic cholecystectomy 12/15/2013  . Preoperative general physical examination 12/15/2013  . Encounter for preventive health examination 11/13/2013  . Cervical spine disease 01/27/2013  . Anemia, iron deficiency 01/21/2013  . Right medial knee pain 01/09/2013  . Back pain, thoracic 08/27/2012  . Aortic stenosis, mild 05/06/2012  . Asthma 07/04/2011  . Hemorrhoids, internal 04/05/2011  . Nummular eczema 01/16/2011  . Malignant essential hypertension   . Type II diabetes mellitus with nephropathy ([place])   . Hyperlipidemia     Past Surgical History:  Procedure  Laterality Date  . ABDOMINAL HYSTERECTOMY    . BLADDER SURGERY    . CARDIAC CATHETERIZATION    . CHOLECYSTECTOMY    . LEFT HEART CATH AND CORONARY ANGIOGRAPHY N/A 08/31/2016   Procedure: Left Heart Cath and Coronary Angiography;  Surgeon: Wellington Hampshire, MD;  Location: [place] [facility];  Service: Cardiovascular;  Laterality: N/A;    Prior to Admission medications   Medication Sig Start Date End Date Taking? Authorizing Provider  amLODipine (NORVASC) 5 MG tablet Take 1 tablet (5 mg total) by mouth daily. 11/27/16   [name], MD  amLODipine (NORVASC) 5 MG tablet TAKE 1 TABLET BY MOUTH DAILY 12/25/16   [name], MD  atorvastatin (LIPITOR) 40 MG tablet TAKE 1 TABLET BY MOUTH DAILY AT 6PM 09/26/16   [name], MD  BD MICROTAINER LANCETS MISC Contact  Activated lancet 1.8 mm x 21 g Use once daily to check blood sugars 07/10/16   [name], MD  calcium carbonate (TUMS) 500 MG chewable tablet Chew 1 tablet by mouth. Taken when needed    [provider]  clopidogrel (PLAVIX) 75 MG tablet Take 1 tablet (75 mg total) by mouth daily with breakfast. 09/02/16   Gouru, Illene Silver, MD  fenofibrate 160 MG tablet TAKE 1 TABLET BY MOUTH DAILY FOR HIGH TRIGLYCERIDES 11/07/16   [name], MD  glucose blood (BAYER CONTOUR TEST) test strip Use as instructed 02/28/13   [name], MD  loratadine (CLARITIN) 10 MG tablet Take 10 mg by mouth daily.    [provider]  losartan (COZAAR) 100 MG tablet Take 1 tablet (100  mg total) by mouth daily. 11/14/16   [name], MD  metFORMIN (GLUCOPHAGE-XR) 750 MG 24 hr tablet TAKE 1 TABLET BY MOUTH DAILY WITH BREAKFAST 10/18/16   [name], MD  metoprolol succinate (TOPROL-XL) 50 MG 24 hr tablet TAKE 1 TABLET BY MOUTH DAILY 09/26/16   [name], MD  mupirocin ointment (BACTROBAN) 2 % Apply 1 application topically 2 (two) times daily. To left earlobe 08/28/16   [name], MD  nitroGLYCERIN (NITROSTAT) 0.4 MG SL  tablet Place 1 tablet (0.4 mg total) under the tongue every 5 (five) minutes as needed for chest pain. 09/02/16   Gouru, Illene Silver, MD  ondansetron (ZOFRAN ODT) 4 MG disintegrating tablet Take 1 tablet (4 mg total) by mouth every 8 (eight) hours as needed for nausea or vomiting. 11/01/16   Darel Hong, MD  pantoprazole (PROTONIX) 40 MG tablet Take 40 mg by mouth daily.    [provider]  sertraline (ZOLOFT) 50 MG tablet Take 1 tablet (50 mg total) by mouth daily. AFTER DINNER 11/14/16   [name], MD    Allergies Aspirin; Atropine; Belladonna alkaloids; Codeine; Darvon; Methocarbamol; Penicillins; Sulfa antibiotics; and Iron  Family History  Problem Relation Age of Onset  . Cancer Mother   . Breast cancer Mother 59  . Breast cancer Sister 91  . Colon cancer Brother   . Colon cancer Brother     Social History Social History  Substance Use Topics  . Smoking status: Never Smoker  . Smokeless tobacco: Never Used  . Alcohol use No    Review of Systems Constitutional: Negative for frequent falls Cardiovascular: Negative for chest pain Respiratory: Negative for shortness of breath. Musculoskeletal: Positive for left shoulder pain. Skin: Negative for lesion or wound  Neurological: Negative for paresthesias.  ____________________________________________   PHYSICAL EXAM:  VITAL SIGNS: ED Triage Vitals  Enc Vitals Group     BP 12/31/16 0735 (!) 179/60     Pulse Rate 12/31/16 0735 76     Resp 12/31/16 0735 13     Temp 12/31/16 0735 98.2 F (36.8 C)     Temp Source 12/31/16 0735 Oral     SpO2 12/31/16 0735 100 %     Weight 12/31/16 0735 124 lb (56.2 kg)     Height 12/31/16 0735 5\' 3"  (1.6 m)     Head Circumference --      Peak Flow --      Pain Score 12/31/16 0739 0     Pain Loc --      Pain Edu? --      Excl. in [place]? --     Constitutional: Alert and oriented. Well appearing and in no acute distress. Eyes: Conjunctivae are clear without discharge or drainage.   Head: Atraumatic Neck: Nexus criteria is negative Respiratory: Respirations are even and unlabored. Breath sounds clear to auscultation. Musculoskeletal: Limited AB duction of the left shoulder due to pain. No pain over the humerus, elbow, forearm, wrist, or right hand. No step-off deformity. No tenderness over the clavicle. Neurologic: Awake, alert, oriented 4.  Skin: Intact over exposed skin surfaces.  Psychiatric: Mood, affect, and behavior are appropriate.  ____________________________________________   LABS (all labs ordered are listed, but only abnormal results are displayed)  Labs Reviewed - No data to display ____________________________________________  RADIOLOGY  Left shoulder imaged negative for acute bony abnormality per radiology. I, Sherrie George, personally viewed and evaluated these images (plain radiographs) as part of my medical decision making, as  well as reviewing the written report by the radiologist.  ___________________________________________   PROCEDURES  Procedure(s) performed: None  ____________________________________________   INITIAL IMPRESSION / ASSESSMENT AND PLAN / ED COURSE  MULKI ROESLER is a 83 y.o. female who presents to the emergency department for evaluation and treatment of left shoulder pain after mechanical, non-syncopal fall yesterday in her home. X-rays today are negative for acute bony abnormality. She was encouraged to take Tylenol every 6-8 hours if needed for pain. She was encouraged to follow up with orthopedics for symptoms that are not improving over a week. She was encouraged to return to the emergency department for symptoms of concern if she is unable to see orthopedics or her primary care provider.  Pertinent labs & imaging results that were available during my care of the patient were reviewed by me and considered in my medical decision making (see chart for details).  _________________________________________   FINAL  CLINICAL IMPRESSION(S) / ED DIAGNOSES  Final diagnoses:  Strain of left shoulder, initial encounter    New Prescriptions   No medications on file    If controlled substance prescribed during this visit, 12 month history viewed on the [place] prior to issuing an initial prescription for Schedule II or III opiod.    Victorino Dike, FNP 12/31/16 [id number]    Nance Pear, MD 12/31/16 1122

## 2016-12-31 NOTE — ED Triage Notes (Addendum)
Pt here with son, reports she had a boot on her left foot from an injury and tripped on her boot last night and fell onto her left shoulder. Pt ambulatory in lobby, declined wheelchair. Pt able to move arm, but states shoulder hurts. Pt did not hit her head and had no LOC.

## 2017-01-05 ENCOUNTER — Emergency Department
Admission: EM | Admit: 2017-01-05 | Discharge: 2017-01-05 | Disposition: A | Discharge status: Home / Self Care | Payer: Medicare Other | Attending: Emergency Medicine | Admitting: Emergency Medicine

## 2017-01-05 ENCOUNTER — Emergency Department: Payer: Medicare Other

## 2017-01-05 DIAGNOSIS — S0993XA Unspecified injury of face, initial encounter: Secondary | ICD-10-CM | POA: Diagnosis not present

## 2017-01-05 DIAGNOSIS — Y9301 Activity, walking, marching and hiking: Secondary | ICD-10-CM | POA: Insufficient documentation

## 2017-01-05 DIAGNOSIS — Z7901 Long term (current) use of anticoagulants: Secondary | ICD-10-CM | POA: Diagnosis not present

## 2017-01-05 DIAGNOSIS — W19XXXA Unspecified fall, initial encounter: Secondary | ICD-10-CM

## 2017-01-05 DIAGNOSIS — E1140 Type 2 diabetes mellitus with diabetic neuropathy, unspecified: Secondary | ICD-10-CM | POA: Diagnosis not present

## 2017-01-05 DIAGNOSIS — S0001XA Abrasion of scalp, initial encounter: Secondary | ICD-10-CM | POA: Insufficient documentation

## 2017-01-05 DIAGNOSIS — I2510 Atherosclerotic heart disease of native coronary artery without angina pectoris: Secondary | ICD-10-CM | POA: Diagnosis not present

## 2017-01-05 DIAGNOSIS — Z7984 Long term (current) use of oral hypoglycemic drugs: Secondary | ICD-10-CM | POA: Diagnosis not present

## 2017-01-05 DIAGNOSIS — S0990XA Unspecified injury of head, initial encounter: Secondary | ICD-10-CM | POA: Diagnosis not present

## 2017-01-05 DIAGNOSIS — W01198A Fall on same level from slipping, tripping and stumbling with subsequent striking against other object, initial encounter: Secondary | ICD-10-CM | POA: Diagnosis not present

## 2017-01-05 DIAGNOSIS — I10 Essential (primary) hypertension: Secondary | ICD-10-CM | POA: Diagnosis not present

## 2017-01-05 DIAGNOSIS — Y92000 Kitchen of unspecified non-institutional (private) residence as  the place of occurrence of the external cause: Secondary | ICD-10-CM | POA: Diagnosis not present

## 2017-01-05 DIAGNOSIS — Y999 Unspecified external cause status: Secondary | ICD-10-CM | POA: Diagnosis not present

## 2017-01-05 DIAGNOSIS — E119 Type 2 diabetes mellitus without complications: Secondary | ICD-10-CM | POA: Insufficient documentation

## 2017-01-05 DIAGNOSIS — I252 Old myocardial infarction: Secondary | ICD-10-CM | POA: Diagnosis not present

## 2017-01-05 DIAGNOSIS — J45909 Unspecified asthma, uncomplicated: Secondary | ICD-10-CM | POA: Insufficient documentation

## 2017-01-05 DIAGNOSIS — R51 Headache: Secondary | ICD-10-CM | POA: Diagnosis not present

## 2017-01-05 DIAGNOSIS — S0000XA Unspecified superficial injury of scalp, initial encounter: Secondary | ICD-10-CM | POA: Diagnosis present

## 2017-01-05 DIAGNOSIS — R6884 Jaw pain: Secondary | ICD-10-CM | POA: Diagnosis not present

## 2017-01-05 DIAGNOSIS — S199XXA Unspecified injury of neck, initial encounter: Secondary | ICD-10-CM | POA: Diagnosis not present

## 2017-01-05 MED ORDER — ACETAMINOPHEN 500 MG PO TABS
1000.0000 mg | ORAL_TABLET | Freq: Once | ORAL | Status: AC
  Administered 2017-01-05: 1000 mg via ORAL
  Filled 2017-01-05: qty 2

## 2017-01-05 NOTE — ED Notes (Signed)
EDP at bedside. Pt head bandaged. Pt ready for d/c and wants to go home.

## 2017-01-05 NOTE — ED Provider Notes (Signed)
[facility] Emergency Department Provider Note  ____________________________________________  Time seen: Approximately 9:56 PM  I have reviewed the triage vital signs and the nursing notes.   HISTORY  Chief Complaint Fall   HPI Maureen Morales is a 83 y.o. female with a history of coronary artery disease on Plavix, diabetes, hypertension, hyperlipidemia who presents for evaluation after mechanical fall. Patient reports that she was getting ready to play cards with some of her friends, she went in the kitchen and tripped on a rug. She fell hitting her head onto the stone counter. She denies LOC. She is complaining of midl to moderate constant non radiating pain in the right parietal region where she has a hematoma and also in the right jaw. She denies neck pain, back pain, chest pain, abdominal pain, any extremity pain. Patient denies any dizziness or preceding symptoms leading to her fall. Tetanus is up to date  Past Medical History:  Diagnosis Date  . CAD (coronary artery disease)   . Diabetes mellitus   . Hyperlipidemia   . Hypertension   . Myocardial infarction ([place]) 2018  . Viral gastroenteritis due to [place]-like agent Nov 2012    Patient Active Problem List   Diagnosis Date Noted  . Diarrhea of infectious origin 11/28/2016  . Abnormal CT scan, colon   . Generalized anxiety disorder 09/07/2016  . Unsteady gait 09/07/2016  . Hospital discharge follow-up 09/07/2016  . NSTEMI (non-ST elevated myocardial infarction) ([place]) 08/30/2016  . Pain in joint, ankle and foot 06/24/2014  . S/P laparoscopic cholecystectomy 12/15/2013  . Preoperative general physical examination 12/15/2013  . Encounter for preventive health examination 11/13/2013  . Cervical spine disease 01/27/2013  . Anemia, iron deficiency 01/21/2013  . Right medial knee pain 01/09/2013  . Back pain, thoracic 08/27/2012  . Aortic stenosis, mild 05/06/2012  . Asthma 07/04/2011  .  Hemorrhoids, internal 04/05/2011  . Nummular eczema 01/16/2011  . Malignant essential hypertension   . Type II diabetes mellitus with nephropathy ([place])   . Hyperlipidemia     Past Surgical History:  Procedure Laterality Date  . ABDOMINAL HYSTERECTOMY    . BLADDER SURGERY    . CARDIAC CATHETERIZATION    . CHOLECYSTECTOMY    . LEFT HEART CATH AND CORONARY ANGIOGRAPHY N/A 08/31/2016   Procedure: Left Heart Cath and Coronary Angiography;  Surgeon: Wellington Hampshire, MD;  Location: [place] [facility];  Service: Cardiovascular;  Laterality: N/A;    Prior to Admission medications   Medication Sig Start Date End Date Taking? Authorizing Provider  amLODipine (NORVASC) 5 MG tablet Take 1 tablet (5 mg total) by mouth daily. 11/27/16   [name], MD  amLODipine (NORVASC) 5 MG tablet TAKE 1 TABLET BY MOUTH DAILY 12/25/16   [name], MD  atorvastatin (LIPITOR) 40 MG tablet TAKE 1 TABLET BY MOUTH DAILY AT 6PM 09/26/16   [name], MD  BD MICROTAINER LANCETS MISC Contact  Activated lancet 1.8 mm x 21 g Use once daily to check blood sugars 07/10/16   [name], MD  calcium carbonate (TUMS) 500 MG chewable tablet Chew 1 tablet by mouth. Taken when needed    [provider]  clopidogrel (PLAVIX) 75 MG tablet Take 1 tablet (75 mg total) by mouth daily with breakfast. 09/02/16   Gouru, Aruna, MD  fenofibrate 160 MG tablet TAKE 1 TABLET BY MOUTH DAILY FOR HIGH TRIGLYCERIDES 11/07/16   [name], MD  glucose blood (BAYER CONTOUR TEST)  test strip Use as instructed 02/28/13   [name], MD  loratadine (CLARITIN) 10 MG tablet Take 10 mg by mouth daily.    [provider]  losartan (COZAAR) 100 MG tablet Take 1 tablet (100 mg total) by mouth daily. 11/14/16   [name], MD  metFORMIN (GLUCOPHAGE-XR) 750 MG 24 hr tablet TAKE 1 TABLET BY MOUTH DAILY WITH BREAKFAST 10/18/16   [name], MD  metoprolol succinate (TOPROL-XL) 50 MG 24 hr tablet TAKE 1 TABLET BY  MOUTH DAILY 09/26/16   [name], MD  mupirocin ointment (BACTROBAN) 2 % Apply 1 application topically 2 (two) times daily. To left earlobe 08/28/16   [name], MD  nitroGLYCERIN (NITROSTAT) 0.4 MG SL tablet Place 1 tablet (0.4 mg total) under the tongue every 5 (five) minutes as needed for chest pain. 09/02/16   Gouru, Illene Silver, MD  ondansetron (ZOFRAN ODT) 4 MG disintegrating tablet Take 1 tablet (4 mg total) by mouth every 8 (eight) hours as needed for nausea or vomiting. 11/01/16   Darel Hong, MD  pantoprazole (PROTONIX) 40 MG tablet Take 40 mg by mouth daily.    [provider]  sertraline (ZOLOFT) 50 MG tablet Take 1 tablet (50 mg total) by mouth daily. AFTER DINNER 11/14/16   [name], MD    Allergies Aspirin; Atropine; Belladonna alkaloids; Codeine; Darvon; Methocarbamol; Penicillins; Sulfa antibiotics; and Iron  Family History  Problem Relation Age of Onset  . Cancer Mother   . Breast cancer Mother 22  . Breast cancer Sister 16  . Colon cancer Brother   . Colon cancer Brother     Social History Social History  Substance Use Topics  . Smoking status: Never Smoker  . Smokeless tobacco: Never Used  . Alcohol use No    Review of Systems Constitutional: Negative for fever. Eyes: Negative for visual changes. ENT: + R jaw pain. No neck injury Cardiovascular: Negative for chest injury. Respiratory: Negative for shortness of breath. Negative for chest wall injury. Gastrointestinal: Negative for abdominal pain or injury. Genitourinary: Negative for dysuria. Musculoskeletal: Negative for back injury, negative for arm or leg pain. Skin: Negative for laceration/abrasions. Neurological: + head injury.   ____________________________________________   PHYSICAL EXAM:  VITAL SIGNS: ED Triage Vitals  Enc Vitals Group     BP 01/05/17 2051 (!) 182/81     Pulse Rate 01/05/17 2052 85     Resp 01/05/17 2051 18     Temp 01/05/17 2049 98.4 F (36.9 C)      Temp Source 01/05/17 2049 Oral     SpO2 01/05/17 2052 99 %     Weight 01/05/17 2050 124 lb (56.2 kg)     Height 01/05/17 2050 5\' 3"  (1.6 m)     Head Circumference --      Peak Flow --      Pain Score 01/05/17 2048 5     Pain Loc --      Pain Edu? --      Excl. in [place]? --    Constitutional: Alert and oriented. No acute distress. Does not appear intoxicated. HEENT Head: Normocephalic with abrasion and hematoma on the R temporal region Face: No facial bony tenderness. Stable midface. ttp over the angle of the jaw on the R Ears: No hemotympanum bilaterally. No Battle sign Eyes: No eye injury. PERRL. No raccoon eyes Nose: Nontender. No epistaxis. No rhinorrhea Mouth/Throat: Mucous membranes are moist. No oropharyngeal blood. No dental injury. Airway patent without  stridor. Normal voice. Neck: no C-collar in place. No midline c-spine tenderness.  Cardiovascular: Normal rate, regular rhythm. Normal and symmetric distal pulses are present in all extremities. Pulmonary/Chest: Chest wall is stable and nontender to palpation/compression. Normal respiratory effort. Breath sounds are normal. No crepitus.  Abdominal: Soft, nontender, non distended. Musculoskeletal: Nontender with normal full range of motion in all extremities. No deformities. No thoracic or lumbar midline spinal tenderness. Pelvis is stable. Skin: Skin is warm, dry and intact. No abrasions or contutions. Psychiatric: Speech and behavior are appropriate. Neurological: Normal speech and language. Moves all extremities to command. No gross focal neurologic deficits are appreciated.  Glascow Coma Score: 4 - Opens eyes on own 6 - Follows simple motor commands 5 - Alert and oriented GCS: 15   ____________________________________________   LABS (all labs ordered are listed, but only abnormal results are displayed)  Labs Reviewed - No data to display ____________________________________________  EKG  ED ECG REPORT I, Rudene Re, the attending physician, personally viewed and interpreted this ECG.  Normal sinus rhythm, rate of 85, first-degree AV block, normal QRS and QTc intervals, normal axis, no ST elevations or depressions.  ____________________________________________  RADIOLOGY  CT head/ face/ neck: 1. No CT evidence for acute intracranial abnormality. Atrophy and mild small vessel ischemic changes of the white matter. 2. No acute displaced facial bone fracture. Moderate soft tissue swelling over the right parietal and temporal bones 3. Diffuse degenerative changes of the cervical spine. No acute osseous abnormality.  ____________________________________________   PROCEDURES  Procedure(s) performed: None Procedures Critical Care performed:  None ____________________________________________   INITIAL IMPRESSION / ASSESSMENT AND PLAN / ED COURSE   83 y.o. female with a history of coronary artery disease on Plavix, diabetes, hypertension, hyperlipidemia who presents for evaluation after mechanical fall complaining of right temporal area pain and right jaw pain. No signs or symptoms of basilar skull fracture. Patient has a skin abrasion with no laceration. Tetanus shot is up-to-date. We'll get CT head, neck, face. We'll give Tylenol for the pain.    _________________________ 10:36 PM on 01/05/2017 -----------------------------------------  images with no acute findings. Patient remains neurologically intact. We'll discharge home on Tylenol and follow-up with primary care doctor. Recommend close monitoring for the next 24 hours for risks of delayed bleed in the setting of blood thinner. Patient's family members will be with her this evening.  Pertinent labs & imaging results that were available during my care of the patient were reviewed by me and considered in my medical decision making (see chart for details).    ____________________________________________   FINAL CLINICAL IMPRESSION(S)  / ED DIAGNOSES  Final diagnoses:  Fall, initial encounter  Abrasion of scalp, initial encounter      NEW MEDICATIONS STARTED DURING THIS VISIT:  New Prescriptions   No medications on file     Note:  This document was prepared using Dragon voice recognition software and may include unintentional dictation errors.    Rudene Re, MD 01/05/17 2237

## 2017-01-05 NOTE — Discharge Instructions (Signed)
You were seen in the emergency department after a fall. Luckily all of your imaging studies did not show any evidence of injuries. Follow-up with you doctor within the next 2-3 days for further evaluation. Sometimes injuries can present at a later time and therefore it is imperative that you return to the emergency room if you have a severe headache, facial droop, neck pain, numbness or weakness of your extremities, slurred speech, difficulty finding words, chest pain, back pain, abdominal pain, or any other new symptoms that were not present during this visit. You may take Tylenol 1000mg  every 8 hours at home for your pain.

## 2017-01-05 NOTE — ED Notes (Signed)
Patient transported to CT 

## 2017-01-05 NOTE — ED Notes (Signed)
ED Provider at bedside. 

## 2017-01-05 NOTE — ED Notes (Signed)
Pt. Verbalizes understanding of d/c instructions and follow-up. VS stable and pain controlled per pt.  Pt. In NAD at time of d/c and denies further concerns regarding this visit. Pt. Stable at the time of departure from the unit, departing unit by the safest and most appropriate manner per that pt condition and limitations. Pt advised to return to the ED at any time for emergent concerns, or for new/worsening symptoms.   

## 2017-01-05 NOTE — ED Triage Notes (Signed)
Pt came in from home with family and family doctor.  Pt was about to play cards with family and they heard a thud from another room.  Pt his R side of head and jaw.  Pt is on plavix at this time.  Bleeding controlled at this time.  Pt alert at this time, family reports no LOC.

## 2017-01-05 NOTE — ED Notes (Signed)
Pt. Returned to tx. room in stable condition with no acute changes since departure from unit for scans.   

## 2017-01-08 ENCOUNTER — Other Ambulatory Visit: Payer: Self-pay | Admitting: Internal Medicine

## 2017-01-15 ENCOUNTER — Other Ambulatory Visit: Payer: Self-pay | Admitting: Internal Medicine

## 2017-01-15 DIAGNOSIS — M25572 Pain in left ankle and joints of left foot: Secondary | ICD-10-CM

## 2017-01-15 DIAGNOSIS — M79672 Pain in left foot: Secondary | ICD-10-CM

## 2017-01-24 ENCOUNTER — Ambulatory Visit
Admission: RE | Admit: 2017-01-24 | Discharge: 2017-01-24 | Disposition: A | Discharge status: Home / Self Care | Payer: Medicare Other | Source: Ambulatory Visit | Attending: Internal Medicine | Admitting: Internal Medicine

## 2017-01-24 DIAGNOSIS — M79672 Pain in left foot: Secondary | ICD-10-CM | POA: Insufficient documentation

## 2017-01-24 DIAGNOSIS — M19072 Primary osteoarthritis, left ankle and foot: Secondary | ICD-10-CM | POA: Insufficient documentation

## 2017-01-24 DIAGNOSIS — M25572 Pain in left ankle and joints of left foot: Secondary | ICD-10-CM

## 2017-01-24 DIAGNOSIS — M7989 Other specified soft tissue disorders: Secondary | ICD-10-CM | POA: Diagnosis not present

## 2017-01-24 NOTE — Telephone Encounter (Signed)
Orders

## 2017-01-24 NOTE — Progress Notes (Signed)
Orders

## 2017-01-26 ENCOUNTER — Ambulatory Visit (INDEPENDENT_AMBULATORY_CARE_PROVIDER_SITE_OTHER): Payer: Medicare Other | Admitting: Cardiovascular Disease

## 2017-01-26 ENCOUNTER — Encounter: Payer: Self-pay | Admitting: Cardiovascular Disease

## 2017-01-26 VITALS — BP 156/60 | HR 75 | Ht 62.0 in | Wt 128.8 lb

## 2017-01-26 DIAGNOSIS — I2584 Coronary atherosclerosis due to calcified coronary lesion: Secondary | ICD-10-CM | POA: Diagnosis not present

## 2017-01-26 DIAGNOSIS — I350 Nonrheumatic aortic (valve) stenosis: Secondary | ICD-10-CM | POA: Diagnosis not present

## 2017-01-26 DIAGNOSIS — E784 Other hyperlipidemia: Secondary | ICD-10-CM

## 2017-01-26 DIAGNOSIS — I10 Essential (primary) hypertension: Secondary | ICD-10-CM

## 2017-01-26 DIAGNOSIS — E7849 Other hyperlipidemia: Secondary | ICD-10-CM

## 2017-01-26 DIAGNOSIS — I2510 Atherosclerotic heart disease of native coronary artery without angina pectoris: Secondary | ICD-10-CM | POA: Diagnosis not present

## 2017-01-26 MED ORDER — AMLODIPINE BESYLATE 2.5 MG PO TABS: 2.5000 mg | ORAL_TABLET | Freq: Every day | ORAL | 3 refills | Status: DC

## 2017-01-26 MED ORDER — LOSARTAN POTASSIUM-HCTZ 100-12.5 MG PO TABS: 1.0000 | ORAL_TABLET | Freq: Every day | ORAL | 3 refills | Status: DC

## 2017-01-26 NOTE — Progress Notes (Signed)
Cardiology Office Note   Date:  01/26/2017   ID:  MAUDINE KLUESNER, DOB 1922/09/29, MRN [id number]  PCP:  [name], MD  Cardiologist:   [name], MD   Chief Complaint  Patient presents with  . other    4 month follow up. patient c/o left leg pain and swelling. Meds reviewed verbally with patient.       History of Present Illness: Maureen Morales is a 83 y.o. female who presents for a follow up regarding aortic stenosis and recent hospitalization for non-ST elevation myocardial infarction. She has chronic medical conditions that include hypertension, hyperlipidemia and type 2 diabetes.  She was hospitalized In May of this year with non-ST elevation myocardial infarction in the setting of uncontrolled hypertension. Troponin peaked at 5.79. Echocardiogram showed hyperdynamic LV systolic function, mild aortic stenosis with mean gradient of 14 mmHg, mild mitral regurgitation, mild pulmonary hypertension and trivial posterior pericardial effusion. Cardiac catheterization showed mild to moderate nonobstructive coronary artery disease. Worst stenosis was 60% in the midright coronary artery. She was placed on Plavix and atorvastatin. Outpatient renal artery duplex showed no evidence of renal artery stenosis.  She has been doing well with no chest pain or shortness of breath. She fell a few months ago and twisted her left ankle. Since then, she experienced significant swelling. The swelling is actually in both legs but worse on the left side. She had venous duplex done which showed no evidence of DVT. She had recent MRI done which showed no acute injuries.  Past Medical History:  Diagnosis Date  . CAD (coronary artery disease)   . Diabetes mellitus   . Hyperlipidemia   . Hypertension   . Myocardial infarction ([place]) 2018  . Viral gastroenteritis due to [place]walk-like agent Nov 2012    Past Surgical History:  Procedure Laterality Date  . ABDOMINAL HYSTERECTOMY    . BLADDER  SURGERY    . CARDIAC CATHETERIZATION    . CHOLECYSTECTOMY    . LEFT HEART CATH AND CORONARY ANGIOGRAPHY N/A 08/31/2016   Procedure: Left Heart Cath and Coronary Angiography;  Surgeon: Wellington Hampshire, MD;  Location: [place] [facility];  Service: Cardiovascular;  Laterality: N/A;     Current Outpatient Prescriptions  Medication Sig Dispense Refill  . atorvastatin (LIPITOR) 40 MG tablet TAKE 1 TABLET BY MOUTH DAILY AT 6PM 30 tablet 3  . BD MICROTAINER LANCETS MISC Contact  Activated lancet 1.8 mm x 21 g Use once daily to check blood sugars 100 each 1  . calcium carbonate (TUMS) 500 MG chewable tablet Chew 1 tablet by mouth. Taken when needed    . clopidogrel (PLAVIX) 75 MG tablet Take 1 tablet (75 mg total) by mouth daily with breakfast. 30 tablet 0  . fenofibrate 160 MG tablet TAKE 1 TABLET BY MOUTH DAILY FOR HIGH TRIGLYCERIDES 30 tablet 3  . glucose blood (BAYER CONTOUR TEST) test strip Use as instructed 100 each 5  . loratadine (CLARITIN) 10 MG tablet Take 10 mg by mouth daily.    [name] losartan (COZAAR) 100 MG tablet Take 1 tablet (100 mg total) by mouth daily. 90 tablet 0  . metFORMIN (GLUCOPHAGE-XR) 750 MG 24 hr tablet TAKE 1 TABLET BY MOUTH DAILY WITH BREAKFAST 30 tablet 3  . metoprolol succinate (TOPROL-XL) 50 MG 24 hr tablet TAKE 1 TABLET BY MOUTH DAILY 30 tablet 3  . mupirocin ointment (BACTROBAN) 2 % Apply 1 application topically 2 (two) times daily. To left earlobe 22 g  0  . nitroGLYCERIN (NITROSTAT) 0.4 MG SL tablet Place 1 tablet (0.4 mg total) under the tongue every 5 (five) minutes as needed for chest pain. 10 tablet 0  . ondansetron (ZOFRAN ODT) 4 MG disintegrating tablet Take 1 tablet (4 mg total) by mouth every 8 (eight) hours as needed for nausea or vomiting. 20 tablet 0  . pantoprazole (PROTONIX) 40 MG tablet Take 40 mg by mouth daily.    . sertraline (ZOLOFT) 50 MG tablet Take 1 tablet (50 mg total) by mouth daily. AFTER DINNER 90 tablet 0   No current  facility-administered medications for this visit.     Allergies:   Aspirin; Atropine; Belladonna alkaloids; Codeine; Darvon; Methocarbamol; Penicillins; Sulfa antibiotics; and Iron    Social History:  The patient  reports that she has never smoked. She has never used smokeless tobacco. She reports that she does not drink alcohol or use drugs.   Family History:  The patient's family history includes Breast cancer (age of onset: 23) in her sister; Breast cancer (age of onset: 91) in her mother; Cancer in her mother; Colon cancer in her brother and brother.    ROS:  Please see the history of present illness.   Otherwise, review of systems are positive for none.   All other systems are reviewed and negative.    PHYSICAL EXAM: VS:  BP (!) 156/60 (BP Location: Left Arm, Patient Position: Sitting, Cuff Size: Normal)   Pulse 75   Ht 5\' 2"  (1.575 m)   Wt 128 lb 12 oz (58.4 kg)   BMI 23.55 kg/m  , BMI Body mass index is 23.55 kg/m. GEN: Well nourished, well developed, in no acute distress  HEENT: normal  Neck: no JVD, carotid bruits, or masses Cardiac: RRR; no rubs, or gallops,no edema . 3/6 crescendo decrescendo systolic murmur in the aortic area which is mid peaking Respiratory:  clear to auscultation bilaterally, normal work of breathing GI: soft, nontender, nondistended, + BS MS: no deformity or atrophy  Skin: warm and dry, no rash Neuro:  Strength and sensation are intact Psych: euthymic mood, full affect    EKG:  EKG is ordered today. The ekg ordered today demonstrates normal sinus rhythm with possible left atrial enlargement.   Recent Labs: 08/30/2016: B Natriuretic Peptide 203.0 11/03/2016: Magnesium 1.7 12/04/2016: ALT 14; BUN 42; Creatinine, Ser 1.13; Hemoglobin 10.4; Platelets 269.0; Potassium 4.5; Sodium 139    Lipid Panel    Component Value Date/Time   CHOL 132 12/04/2016 0808   TRIG 128.0 12/04/2016 0808   HDL 38.30 (L) 12/04/2016 0808   CHOLHDL 3 12/04/2016 0808    VLDL 25.6 12/04/2016 0808   LDLCALC 68 12/04/2016 0808   LDLDIRECT 92.0 07/06/2016 1059      Wt Readings from Last 3 Encounters:  01/26/17 128 lb 12 oz (58.4 kg)  01/05/17 124 lb (56.2 kg)  12/31/16 124 lb (56.2 kg)       No flowsheet data found.    ASSESSMENT AND PLAN:  1. Coronary artery disease involving native coronary arteries without angina: Continue Plavix given that she is intolerant to aspirin. Continue blood pressure control. She has no anginal symptoms at the present time.  2. Mild aortic valve stenosis: This has been stable on echocardiogram. Repeat echocardiogram in May 2019.   3. Essential hypertension: Blood pressure  continues to be elevated. I do think that some of her leg edema is related to high dose amlodipine. She is currently taking 10 mg daily. I elected  to decrease amlodipine to 2.5 mg once daily. I switched losartan to losartan/hydrochlorothiazide 100/12.5 mg once daily. Check basic metabolic profile in one week.  4. Hyperlipidemia: Continue treatment with atorvastatin. Most recent lipid profile showed an LDL of 68.    Disposition:   FU with me in 3 months.  Signed,  [name], MD  01/26/2017 3:05 PM    [facility]

## 2017-01-26 NOTE — Patient Instructions (Signed)
Medication Instructions:  Your physician has recommended you make the following change in your medication:  DECREASE amlodipine to 2.5mg  once daily STOP taking losartan START taking losartan-HCTZ 100-12.5mg  once daily   Labwork: BMET in one week. You may have this done at your PCP office.   Testing/Procedures: none  Follow-Up: Your physician recommends that you schedule a follow-up appointment in: 3 months with Dr. Fletcher Anon.    Any Other Special Instructions Will Be Listed Below (If Applicable).     If you need a refill on your cardiac medications before your next appointment, please call your pharmacy.

## 2017-02-02 ENCOUNTER — Other Ambulatory Visit (INDEPENDENT_AMBULATORY_CARE_PROVIDER_SITE_OTHER): Payer: Medicare Other

## 2017-02-02 ENCOUNTER — Telehealth: Payer: Self-pay | Admitting: Radiology

## 2017-02-02 DIAGNOSIS — I10 Essential (primary) hypertension: Secondary | ICD-10-CM

## 2017-02-02 LAB — BASIC METABOLIC PANEL
BUN/Creatinine Ratio: 34 (calc) — ABNORMAL HIGH (ref 6–22)
BUN: 40 mg/dL — AB (ref 7–25)
CALCIUM: 9.4 mg/dL (ref 8.6–10.4)
CO2: 23 mmol/L (ref 20–32)
CREATININE: 1.17 mg/dL — AB (ref 0.60–0.88)
Chloride: 104 mmol/L (ref 98–110)
GLUCOSE: 138 mg/dL — AB (ref 65–99)
POTASSIUM: 4.3 mmol/L (ref 3.5–5.3)
SODIUM: 136 mmol/L (ref 135–146)

## 2017-02-02 NOTE — Addendum Note (Signed)
Addended by: Leeanne Rio on: 02/02/2017 09:13 AM   Modules accepted: Orders

## 2017-02-02 NOTE — Telephone Encounter (Signed)
Pt coming in for labs today , please place future orders. Thank you.  

## 2017-02-02 NOTE — Telephone Encounter (Signed)
Pt came in this morning & the lab for Dr. Fletcher Anon was drawn. If additional labs were needed, please let us know (we drew extra tubes in case). If not, they will be discarded at lunch today.  Thanks

## 2017-02-06 ENCOUNTER — Telehealth: Payer: Self-pay | Admitting: Internal Medicine

## 2017-02-06 NOTE — Telephone Encounter (Signed)
There is no significant change in her kidney function since dr Fletcher Anon placed her on hctz for blood pressure,  And her sodium level is normal , but she needs to drink an extra glass of water daily

## 2017-02-06 NOTE — Telephone Encounter (Signed)
Spoke with pt and informed her of her lab results. The pt repeated everything with understanding.

## 2017-02-14 DIAGNOSIS — Z23 Encounter for immunization: Secondary | ICD-10-CM | POA: Diagnosis not present

## 2017-02-16 ENCOUNTER — Emergency Department: Payer: Medicare Other

## 2017-02-16 ENCOUNTER — Inpatient Hospital Stay
Admission: EM | Admit: 2017-02-16 | Discharge: 2017-02-19 | DRG: 470 | Disposition: A | Discharge status: Skilled Nursing Facility | Payer: Medicare Other | Attending: Internal Medicine | Admitting: Internal Medicine

## 2017-02-16 ENCOUNTER — Encounter: Payer: Self-pay | Admitting: Emergency Medicine

## 2017-02-16 DIAGNOSIS — J45909 Unspecified asthma, uncomplicated: Secondary | ICD-10-CM | POA: Diagnosis present

## 2017-02-16 DIAGNOSIS — M6281 Muscle weakness (generalized): Secondary | ICD-10-CM | POA: Diagnosis not present

## 2017-02-16 DIAGNOSIS — Z880 Allergy status to penicillin: Secondary | ICD-10-CM

## 2017-02-16 DIAGNOSIS — Z885 Allergy status to narcotic agent status: Secondary | ICD-10-CM

## 2017-02-16 DIAGNOSIS — R262 Difficulty in walking, not elsewhere classified: Secondary | ICD-10-CM | POA: Diagnosis not present

## 2017-02-16 DIAGNOSIS — Z79899 Other long term (current) drug therapy: Secondary | ICD-10-CM | POA: Diagnosis not present

## 2017-02-16 DIAGNOSIS — W010XXA Fall on same level from slipping, tripping and stumbling without subsequent striking against object, initial encounter: Secondary | ICD-10-CM | POA: Diagnosis present

## 2017-02-16 DIAGNOSIS — W19XXXA Unspecified fall, initial encounter: Secondary | ICD-10-CM | POA: Diagnosis not present

## 2017-02-16 DIAGNOSIS — S72001A Fracture of unspecified part of neck of right femur, initial encounter for closed fracture: Principal | ICD-10-CM | POA: Diagnosis present

## 2017-02-16 DIAGNOSIS — Z888 Allergy status to other drugs, medicaments and biological substances status: Secondary | ICD-10-CM

## 2017-02-16 DIAGNOSIS — Z66 Do not resuscitate: Secondary | ICD-10-CM | POA: Diagnosis present

## 2017-02-16 DIAGNOSIS — Z7902 Long term (current) use of antithrombotics/antiplatelets: Secondary | ICD-10-CM

## 2017-02-16 DIAGNOSIS — S72041A Displaced fracture of base of neck of right femur, initial encounter for closed fracture: Secondary | ICD-10-CM | POA: Diagnosis not present

## 2017-02-16 DIAGNOSIS — Y92009 Unspecified place in unspecified non-institutional (private) residence as the place of occurrence of the external cause: Secondary | ICD-10-CM

## 2017-02-16 DIAGNOSIS — Z9889 Other specified postprocedural states: Secondary | ICD-10-CM

## 2017-02-16 DIAGNOSIS — M79601 Pain in right arm: Secondary | ICD-10-CM | POA: Diagnosis not present

## 2017-02-16 DIAGNOSIS — Z886 Allergy status to analgesic agent status: Secondary | ICD-10-CM

## 2017-02-16 DIAGNOSIS — E785 Hyperlipidemia, unspecified: Secondary | ICD-10-CM | POA: Diagnosis present

## 2017-02-16 DIAGNOSIS — Z7984 Long term (current) use of oral hypoglycemic drugs: Secondary | ICD-10-CM | POA: Diagnosis not present

## 2017-02-16 DIAGNOSIS — E1121 Type 2 diabetes mellitus with diabetic nephropathy: Secondary | ICD-10-CM | POA: Diagnosis present

## 2017-02-16 DIAGNOSIS — Z96641 Presence of right artificial hip joint: Secondary | ICD-10-CM | POA: Diagnosis not present

## 2017-02-16 DIAGNOSIS — K219 Gastro-esophageal reflux disease without esophagitis: Secondary | ICD-10-CM | POA: Diagnosis not present

## 2017-02-16 DIAGNOSIS — E119 Type 2 diabetes mellitus without complications: Secondary | ICD-10-CM | POA: Diagnosis not present

## 2017-02-16 DIAGNOSIS — Z9181 History of falling: Secondary | ICD-10-CM | POA: Diagnosis not present

## 2017-02-16 DIAGNOSIS — I2510 Atherosclerotic heart disease of native coronary artery without angina pectoris: Secondary | ICD-10-CM | POA: Diagnosis present

## 2017-02-16 DIAGNOSIS — M542 Cervicalgia: Secondary | ICD-10-CM | POA: Diagnosis not present

## 2017-02-16 DIAGNOSIS — F411 Generalized anxiety disorder: Secondary | ICD-10-CM | POA: Diagnosis present

## 2017-02-16 DIAGNOSIS — I10 Essential (primary) hypertension: Secondary | ICD-10-CM | POA: Diagnosis present

## 2017-02-16 DIAGNOSIS — I252 Old myocardial infarction: Secondary | ICD-10-CM

## 2017-02-16 DIAGNOSIS — Z471 Aftercare following joint replacement surgery: Secondary | ICD-10-CM | POA: Diagnosis not present

## 2017-02-16 DIAGNOSIS — S72001D Fracture of unspecified part of neck of right femur, subsequent encounter for closed fracture with routine healing: Secondary | ICD-10-CM | POA: Diagnosis not present

## 2017-02-16 DIAGNOSIS — E1169 Type 2 diabetes mellitus with other specified complication: Secondary | ICD-10-CM | POA: Diagnosis present

## 2017-02-16 DIAGNOSIS — Z23 Encounter for immunization: Secondary | ICD-10-CM

## 2017-02-16 DIAGNOSIS — M25551 Pain in right hip: Secondary | ICD-10-CM | POA: Diagnosis not present

## 2017-02-16 DIAGNOSIS — Z882 Allergy status to sulfonamides status: Secondary | ICD-10-CM

## 2017-02-16 DIAGNOSIS — D62 Acute posthemorrhagic anemia: Secondary | ICD-10-CM | POA: Diagnosis not present

## 2017-02-16 DIAGNOSIS — Z7401 Bed confinement status: Secondary | ICD-10-CM | POA: Diagnosis not present

## 2017-02-16 DIAGNOSIS — R918 Other nonspecific abnormal finding of lung field: Secondary | ICD-10-CM | POA: Diagnosis not present

## 2017-02-16 DIAGNOSIS — M25552 Pain in left hip: Secondary | ICD-10-CM | POA: Diagnosis not present

## 2017-02-16 DIAGNOSIS — F419 Anxiety disorder, unspecified: Secondary | ICD-10-CM | POA: Diagnosis not present

## 2017-02-16 LAB — CBC WITH DIFFERENTIAL/PLATELET
BASOS ABS: 0 10*3/uL (ref 0–0.1)
Basophils Relative: 0 %
EOS ABS: 0.2 10*3/uL (ref 0–0.7)
EOS PCT: 2 %
HCT: 29.7 % — ABNORMAL LOW (ref 35.0–47.0)
Hemoglobin: 10.1 g/dL — ABNORMAL LOW (ref 12.0–16.0)
Lymphocytes Relative: 12 %
Lymphs Abs: 1 10*3/uL (ref 1.0–3.6)
MCH: 28.5 pg (ref 26.0–34.0)
MCHC: 34 g/dL (ref 32.0–36.0)
MCV: 83.9 fL (ref 80.0–100.0)
Monocytes Absolute: 0.7 10*3/uL (ref 0.2–0.9)
Monocytes Relative: 8 %
Neutro Abs: 6.6 10*3/uL — ABNORMAL HIGH (ref 1.4–6.5)
Neutrophils Relative %: 78 %
PLATELETS: 292 10*3/uL (ref 150–440)
RBC: 3.54 MIL/uL — AB (ref 3.80–5.20)
RDW: 14.1 % (ref 11.5–14.5)
WBC: 8.4 10*3/uL (ref 3.6–11.0)

## 2017-02-16 LAB — HEPATIC FUNCTION PANEL
ALK PHOS: 66 U/L (ref 38–126)
ALT: 21 U/L (ref 14–54)
AST: 24 U/L (ref 15–41)
Albumin: 4 g/dL (ref 3.5–5.0)
BILIRUBIN TOTAL: 0.5 mg/dL (ref 0.3–1.2)
Bilirubin, Direct: <0.1 mg/dL — ABNORMAL LOW (ref 0.1–0.5)
Total Protein: 6.9 g/dL (ref 6.5–8.1)

## 2017-02-16 LAB — TYPE AND SCREEN
ABO/RH(D): O NEG
ANTIBODY SCREEN: NEGATIVE

## 2017-02-16 LAB — BASIC METABOLIC PANEL
Anion gap: 11 (ref 5–15)
BUN: 48 mg/dL — ABNORMAL HIGH (ref 6–20)
CHLORIDE: 105 mmol/L (ref 101–111)
CO2: 22 mmol/L (ref 22–32)
CREATININE: 1.21 mg/dL — AB (ref 0.44–1.00)
Calcium: 9.4 mg/dL (ref 8.9–10.3)
GFR calc Af Amer: 44 mL/min — ABNORMAL LOW (ref >60)
GFR calc non Af Amer: 38 mL/min — ABNORMAL LOW (ref >60)
Glucose, Bld: 197 mg/dL — ABNORMAL HIGH (ref 65–99)
Potassium: 3.8 mmol/L (ref 3.5–5.1)
SODIUM: 138 mmol/L (ref 135–145)

## 2017-02-16 LAB — PROTIME-INR
INR: 1.19
PROTHROMBIN TIME: 15 s (ref 11.4–15.2)

## 2017-02-16 LAB — GLUCOSE, CAPILLARY: GLUCOSE-CAPILLARY: 230 mg/dL — AB (ref 65–99)

## 2017-02-16 MED ORDER — CLOPIDOGREL BISULFATE 75 MG PO TABS
75.0000 mg | ORAL_TABLET | Freq: Every day | ORAL | Status: DC
Start: 2017-02-17 — End: 2017-02-19
  Administered 2017-02-18 – 2017-02-19 (×2): 75 mg via ORAL
  Filled 2017-02-16 (×2): qty 1

## 2017-02-16 MED ORDER — MORPHINE SULFATE (PF) 4 MG/ML IV SOLN
4.0000 mg | Freq: Once | INTRAVENOUS | Status: AC
  Administered 2017-02-16: 4 mg via INTRAVENOUS

## 2017-02-16 MED ORDER — AMLODIPINE BESYLATE 5 MG PO TABS
5.0000 mg | ORAL_TABLET | Freq: Every day | ORAL | Status: DC
  Filled 2017-02-16: qty 1

## 2017-02-16 MED ORDER — ONDANSETRON HCL 4 MG/2ML IJ SOLN
4.0000 mg | Freq: Once | INTRAMUSCULAR | Status: AC
  Administered 2017-02-16: 4 mg via INTRAVENOUS
  Filled 2017-02-16: qty 2

## 2017-02-16 MED ORDER — ONDANSETRON HCL 4 MG/2ML IJ SOLN
4.0000 mg | Freq: Four times a day (QID) | INTRAMUSCULAR | Status: DC | PRN
  Administered 2017-02-17 (×2): 4 mg via INTRAVENOUS

## 2017-02-16 MED ORDER — LOSARTAN POTASSIUM-HCTZ 100-12.5 MG PO TABS: 1.0000 | ORAL_TABLET | Freq: Every day | ORAL | Status: DC

## 2017-02-16 MED ORDER — METOPROLOL SUCCINATE ER 50 MG PO TB24
50.0000 mg | ORAL_TABLET | Freq: Every day | ORAL | Status: DC
  Filled 2017-02-16 (×2): qty 1

## 2017-02-16 MED ORDER — MORPHINE SULFATE (PF) 4 MG/ML IV SOLN
INTRAVENOUS | Status: AC
  Filled 2017-02-16: qty 1

## 2017-02-16 MED ORDER — ACETAMINOPHEN 325 MG PO TABS
650.0000 mg | ORAL_TABLET | Freq: Four times a day (QID) | ORAL | Status: DC | PRN
  Administered 2017-02-18 – 2017-02-19 (×4): 650 mg via ORAL
  Filled 2017-02-16 (×4): qty 2

## 2017-02-16 MED ORDER — SERTRALINE HCL 50 MG PO TABS
50.0000 mg | ORAL_TABLET | Freq: Every day | ORAL | Status: DC
  Administered 2017-02-16 – 2017-02-19 (×3): 50 mg via ORAL
  Filled 2017-02-16 (×3): qty 1

## 2017-02-16 MED ORDER — INSULIN ASPART 100 UNIT/ML ~~LOC~~ SOLN
0.0000 [IU] | Freq: Four times a day (QID) | SUBCUTANEOUS | Status: DC
Start: 2017-02-16 — End: 2017-02-19
  Administered 2017-02-16: 3 [IU] via SUBCUTANEOUS
  Administered 2017-02-17 (×2): 2 [IU] via SUBCUTANEOUS
  Administered 2017-02-18 (×2): 1 [IU] via SUBCUTANEOUS
  Administered 2017-02-18: 3 [IU] via SUBCUTANEOUS
  Administered 2017-02-18: 2 [IU] via SUBCUTANEOUS
  Administered 2017-02-19: 5 [IU] via SUBCUTANEOUS
  Administered 2017-02-19 (×2): 1 [IU] via SUBCUTANEOUS
  Filled 2017-02-16 (×8): qty 1

## 2017-02-16 MED ORDER — MORPHINE SULFATE (PF) 4 MG/ML IV SOLN
6.0000 mg | Freq: Once | INTRAVENOUS | Status: AC
  Administered 2017-02-16: 6 mg via INTRAVENOUS
  Filled 2017-02-16: qty 2

## 2017-02-16 MED ORDER — HYDROCODONE-ACETAMINOPHEN 5-325 MG PO TABS
1.0000 | ORAL_TABLET | ORAL | Status: DC | PRN
  Administered 2017-02-17 (×2): 1 via ORAL
  Filled 2017-02-16: qty 1
  Filled 2017-02-16: qty 2
  Filled 2017-02-16: qty 1

## 2017-02-16 MED ORDER — FENTANYL CITRATE (PF) 100 MCG/2ML IJ SOLN
50.0000 ug | Freq: Once | INTRAMUSCULAR | Status: AC
  Administered 2017-02-16: 50 ug via INTRAVENOUS
  Filled 2017-02-16: qty 2

## 2017-02-16 MED ORDER — LOSARTAN POTASSIUM 50 MG PO TABS
100.0000 mg | ORAL_TABLET | Freq: Every day | ORAL | Status: DC
  Filled 2017-02-16: qty 2

## 2017-02-16 MED ORDER — MORPHINE SULFATE (PF) 4 MG/ML IV SOLN
4.0000 mg | INTRAVENOUS | Status: DC | PRN
  Administered 2017-02-16: 4 mg via INTRAVENOUS
  Filled 2017-02-16: qty 1

## 2017-02-16 MED ORDER — ONDANSETRON HCL 4 MG PO TABS: 4.0000 mg | ORAL_TABLET | Freq: Four times a day (QID) | ORAL | Status: DC | PRN

## 2017-02-16 MED ORDER — DIPHENHYDRAMINE HCL 25 MG PO CAPS: 25.0000 mg | ORAL_CAPSULE | Freq: Every evening | ORAL | Status: DC | PRN

## 2017-02-16 MED ORDER — HYDROCHLOROTHIAZIDE 12.5 MG PO CAPS
12.5000 mg | ORAL_CAPSULE | Freq: Every day | ORAL | Status: DC
  Filled 2017-02-16: qty 1

## 2017-02-16 MED ORDER — ATORVASTATIN CALCIUM 20 MG PO TABS
40.0000 mg | ORAL_TABLET | Freq: Every day | ORAL | Status: DC
  Filled 2017-02-16: qty 2

## 2017-02-16 MED ORDER — DIPHENHYDRAMINE HCL 25 MG PO CAPS: 25.0000 mg | ORAL_CAPSULE | Freq: Once | ORAL | Status: DC

## 2017-02-16 MED ORDER — TRANEXAMIC ACID 1000 MG/10ML IV SOLN
2000.0000 mg | Freq: Once | INTRAVENOUS | Status: AC
  Administered 2017-02-17 (×2): 1000 mg via TOPICAL
  Filled 2017-02-16: qty 20

## 2017-02-16 MED ORDER — PANTOPRAZOLE SODIUM 40 MG PO TBEC
40.0000 mg | DELAYED_RELEASE_TABLET | Freq: Every day | ORAL | Status: DC
Start: 2017-02-17 — End: 2017-02-19
  Administered 2017-02-18 – 2017-02-19 (×2): 40 mg via ORAL
  Filled 2017-02-16 (×2): qty 1

## 2017-02-16 MED ORDER — ACETAMINOPHEN 650 MG RE SUPP: 650.0000 mg | Freq: Four times a day (QID) | RECTAL | Status: DC | PRN

## 2017-02-16 NOTE — ED Notes (Signed)
OR doctor at bedside with patient.

## 2017-02-16 NOTE — ED Provider Notes (Signed)
[facility] Emergency Department Provider Note  ____________________________________________   First MD Initiated Contact with Patient 02/16/17 1949     (approximate)  I have reviewed the triage vital signs and the nursing notes.   HISTORY  Chief Complaint Fall   HPI Maureen Morales is a 83 y.o. female is brought to the emergency department via EMS with sudden onset severe right hip pain that began shortly prior to arrival after mechanical fall. She was ambulating at home when she tripped over her dog and fell onto her right hip. She did not strike her head and she did not lose consciousness. She has been unable to bear weight ever since. Her pain is worse with movement and somewhat improved with rest and when given fentanyl en route.   Past Medical History:  Diagnosis Date  . CAD (coronary artery disease)   . Diabetes mellitus   . Hyperlipidemia   . Hypertension   . Myocardial infarction ([place]) 2018  . Viral gastroenteritis due to [place]-like agent Nov 2012    Patient Active Problem List   Diagnosis Date Noted  . Closed displaced fracture of right femoral neck ([place]) 02/16/2017  . Diarrhea of infectious origin 11/28/2016  . Abnormal CT scan, colon   . Generalized anxiety disorder 09/07/2016  . Unsteady gait 09/07/2016  . Hospital discharge follow-up 09/07/2016  . NSTEMI (non-ST elevated myocardial infarction) ([place]) 08/30/2016  . Pain in joint, ankle and foot 06/24/2014  . S/P laparoscopic cholecystectomy 12/15/2013  . Preoperative general physical examination 12/15/2013  . Encounter for preventive health examination 11/13/2013  . Cervical spine disease 01/27/2013  . Anemia, iron deficiency 01/21/2013  . Right medial knee pain 01/09/2013  . Back pain, thoracic 08/27/2012  . Aortic stenosis, mild 05/06/2012  . Asthma 07/04/2011  . Hemorrhoids, internal 04/05/2011  . Nummular eczema 01/16/2011  . Essential hypertension   . Type II diabetes  mellitus with nephropathy ([place])   . Hyperlipidemia     Past Surgical History:  Procedure Laterality Date  . ABDOMINAL HYSTERECTOMY    . BLADDER SURGERY    . CARDIAC CATHETERIZATION    . CHOLECYSTECTOMY    . LEFT HEART CATH AND CORONARY ANGIOGRAPHY N/A 08/31/2016   Procedure: Left Heart Cath and Coronary Angiography;  Surgeon: Wellington Hampshire, MD;  Location: [place] [facility];  Service: Cardiovascular;  Laterality: N/A;    Prior to Admission medications   Medication Sig Start Date End Date Taking? Authorizing Provider  amLODipine (NORVASC) 2.5 MG tablet Take 1 tablet (2.5 mg total) by mouth daily. Patient taking differently: Take 5 mg by mouth daily.  01/26/17 04/26/17 Yes Wellington Hampshire, MD  atorvastatin (LIPITOR) 40 MG tablet TAKE 1 TABLET BY MOUTH DAILY AT 6PM 09/26/16  Yes [name], MD  BD MICROTAINER LANCETS MISC Contact  Activated lancet 1.8 mm x 21 g Use once daily to check blood sugars 07/10/16  Yes [name], MD  calcium carbonate (TUMS) 500 MG chewable tablet Chew 1 tablet by mouth as needed for heartburn.    Yes [provider]  clopidogrel (PLAVIX) 75 MG tablet Take 1 tablet (75 mg total) by mouth daily with breakfast. 09/02/16  Yes Gouru, Aruna, MD  fenofibrate 160 MG tablet TAKE 1 TABLET BY MOUTH DAILY FOR HIGH TRIGLYCERIDES 11/07/16  Yes [name], MD  glucose blood (BAYER CONTOUR TEST) test strip Use as instructed 02/28/13  Yes [name], MD  loratadine (CLARITIN) 10 MG tablet Take 10  mg by mouth daily.   Yes [provider]  losartan-hydrochlorothiazide (HYZAAR) 100-12.5 MG tablet Take 1 tablet by mouth daily. 01/26/17  Yes Wellington Hampshire, MD  metFORMIN (GLUCOPHAGE-XR) 750 MG 24 hr tablet TAKE 1 TABLET BY MOUTH DAILY WITH BREAKFAST 10/18/16  Yes [name], MD  metoprolol succinate (TOPROL-XL) 50 MG 24 hr tablet TAKE 1 TABLET BY MOUTH DAILY 01/08/17  Yes [name], MD  mupirocin ointment (BACTROBAN) 2 % Apply 1  application topically 2 (two) times daily. To left earlobe 08/28/16  Yes [name], MD  nitroGLYCERIN (NITROSTAT) 0.4 MG SL tablet Place 1 tablet (0.4 mg total) under the tongue every 5 (five) minutes as needed for chest pain. 09/02/16  Yes Gouru, Aruna, MD  ondansetron (ZOFRAN ODT) 4 MG disintegrating tablet Take 1 tablet (4 mg total) by mouth every 8 (eight) hours as needed for nausea or vomiting. 11/01/16  Yes Darel Hong, MD  pantoprazole (PROTONIX) 40 MG tablet Take 40 mg by mouth daily.   Yes [provider]  sertraline (ZOLOFT) 50 MG tablet Take 1 tablet (50 mg total) by mouth daily. AFTER DINNER 11/14/16  Yes [name], MD    Allergies Aspirin; Atropine; Belladonna alkaloids; Codeine; Darvon; Methocarbamol; Penicillins; Sulfa antibiotics; and Iron  Family History  Problem Relation Age of Onset  . Cancer Mother   . Breast cancer Mother 70  . Breast cancer Sister 45  . Colon cancer Brother   . Colon cancer Brother     Social History Social History  Substance Use Topics  . Smoking status: Never Smoker  . Smokeless tobacco: Never Used  . Alcohol use No    Review of Systems Constitutional: No fever/chills Eyes: No visual changes. ENT: No sore throat. Cardiovascular: Denies chest pain. Respiratory: Denies shortness of breath. Gastrointestinal: No abdominal pain.  No nausea, no vomiting.  No diarrhea.  No constipation. Genitourinary: Negative for dysuria. Musculoskeletal: positive for hip pain Skin: Negative for rash. Neurological: Negative for headaches, focal weakness or numbness.   ____________________________________________   PHYSICAL EXAM:  VITAL SIGNS: ED Triage Vitals  Enc Vitals Group     BP 02/16/17 1930 (!) 156/67     Pulse Rate 02/16/17 1930 76     Resp 02/16/17 1930 18     Temp 02/16/17 1930 98.2 F (36.8 C)     Temp src --      SpO2 02/16/17 1925 99 %     Weight 02/16/17 1931 125 lb (56.7 kg)     Height 02/16/17 1931 5\' 3"  (1.6  m)     Head Circumference --      Peak Flow --      Pain Score 02/16/17 1927 10     Pain Loc --      Pain Edu? --      Excl. in [place]? --     Constitutional: alert and oriented 4 appears extremely uncomfortable moaning and crying in pain Eyes: PERRL EOMI. Head: Atraumatic. Nose: No congestion/rhinnorhea. Mouth/Throat: No trismus Neck: No stridor.   Cardiovascular: Normal rate, regular rhythm. Grossly normal heart sounds.  Good peripheral circulation. Respiratory: Normal respiratory effort.  No retractions. Lungs CTAB and moving good air Gastrointestinal: soft nontender Musculoskeletal: unable to extend right hip exquisite discomfort with logroll of right hip neurovascularly intact and compartments are soft Neurologic:  Normal speech and language. No gross focal neurologic deficits are appreciated. Skin:  Skin is warm, dry and intact. No rash noted. Psychiatric: Mood and affect  are normal. Speech and behavior are normal.    ____________________________________________   DIFFERENTIAL includes but not limited to  femoral neck fracture, intertrochanteric fracture, hip dislocation, pelvic fracture ____________________________________________   LABS (all labs ordered are listed, but only abnormal results are displayed)  Labs Reviewed  BASIC METABOLIC PANEL - Abnormal; Notable for the following:       Result Value   Glucose, Bld 197 (*)    BUN 48 (*)    Creatinine, Ser 1.21 (*)    GFR calc non Af Amer 38 (*)    GFR calc Af Amer 44 (*)    All other components within normal limits  HEPATIC FUNCTION PANEL - Abnormal; Notable for the following:    Bilirubin, Direct <0.1 (*)    All other components within normal limits  CBC WITH DIFFERENTIAL/PLATELET - Abnormal; Notable for the following:    RBC 3.54 (*)    Hemoglobin 10.1 (*)    HCT 29.7 (*)    Neutro Abs 6.6 (*)    All other components within normal limits  PROTIME-INR  TYPE AND SCREEN    blood work reviewed and  interpreted by me shows no acute disease __________________________________________  EKG   ____________________________________________  RADIOLOGY  hip x-ray reviewed by me shows displaced femoral neck fracture ____________________________________________   PROCEDURES  Procedure(s) performed: no  Procedures  Critical Care performed: no  Observation: no ____________________________________________   INITIAL IMPRESSION / ASSESSMENT AND PLAN / ED COURSE  Pertinent labs & imaging results that were available during my care of the patient were reviewed by me and considered in my medical decision making (see chart for details).  The patient arrives extraordinarily uncomfortable appearing holding her hip in flexion with significant discomfort with even the most minimal movement. She likely has new intertrochanteric hip fracture or other fracture in her hip. Aggressive IV analgesia prior to x-rays.     ----------------------------------------- 9:05 PM on 02/16/2017 -----------------------------------------  I discussed the case with Dr. Daine Gip of Orthopedic Surgery who recommends inpatient admission to the hospitalist service and he will kindly consult on the patient in the morning.  She can eat now and NPO at midnight. ____________________________________________   FINAL CLINICAL IMPRESSION(S) / ED DIAGNOSES  Final diagnoses:  Closed fracture of neck of right femur, initial encounter ([place])      NEW MEDICATIONS STARTED DURING THIS VISIT:  New Prescriptions   No medications on file     Note:  This document was prepared using Dragon voice recognition software and may include unintentional dictation errors.     Darel Hong, MD 02/16/17 [phone]

## 2017-02-16 NOTE — H&P (Signed)
[facility] [place] NAME: Maureen Morales    MR#:  [id number]  DATE OF BIRTH:  1922-09-24  DATE OF ADMISSION:  02/16/2017  PRIMARY CARE PHYSICIAN: [name], MD   REQUESTING/REFERRING PHYSICIAN: Mable Paris, MD  CHIEF COMPLAINT:   Chief Complaint  Patient presents with  . Fall    HISTORY OF PRESENT ILLNESS:  Maureen Morales  is a 83 y.o. female who presents with fall at home when she tripped over her dog and subsequent right hip fracture. Orthopedic surgery contacted by the ED physician and states they will see the patient and likely perform operative repair tomorrow. Hospitalists called for admission.  PAST MEDICAL HISTORY:   Past Medical History:  Diagnosis Date  . CAD (coronary artery disease)   . Diabetes mellitus   . Hyperlipidemia   . Hypertension   . Myocardial infarction ([place]) 2018  . Viral gastroenteritis due to [place]-like agent Nov 2012    PAST SURGICAL HISTORY:   Past Surgical History:  Procedure Laterality Date  . ABDOMINAL HYSTERECTOMY    . BLADDER SURGERY    . CARDIAC CATHETERIZATION    . CHOLECYSTECTOMY    . LEFT HEART CATH AND CORONARY ANGIOGRAPHY N/A 08/31/2016   Procedure: Left Heart Cath and Coronary Angiography;  Surgeon: Wellington Hampshire, MD;  Location: [place] [facility];  Service: Cardiovascular;  Laterality: N/A;    SOCIAL HISTORY:   Social History  Substance Use Topics  . Smoking status: Never Smoker  . Smokeless tobacco: Never Used  . Alcohol use No    FAMILY HISTORY:   Family History  Problem Relation Age of Onset  . Cancer Mother   . Breast cancer Mother 66  . Breast cancer Sister 48  . Colon cancer Brother   . Colon cancer Brother     DRUG ALLERGIES:   Allergies  Allergen Reactions  . Aspirin   . Atropine   . Belladonna Alkaloids   . Codeine   . Darvon   . Methocarbamol   . Penicillins   . Sulfa Antibiotics   . Iron Other (See Comments)    High dose  prescription gives her diarrhea    MEDICATIONS AT HOME:   Prior to Admission medications   Medication Sig Start Date End Date Taking? Authorizing Provider  amLODipine (NORVASC) 2.5 MG tablet Take 1 tablet (2.5 mg total) by mouth daily. 01/26/17 04/26/17  Wellington Hampshire, MD  atorvastatin (LIPITOR) 40 MG tablet TAKE 1 TABLET BY MOUTH DAILY AT 6PM 09/26/16   [name], MD  BD MICROTAINER LANCETS MISC Contact  Activated lancet 1.8 mm x 21 g Use once daily to check blood sugars 07/10/16   [name], MD  calcium carbonate (TUMS) 500 MG chewable tablet Chew 1 tablet by mouth. Taken when needed    [provider]  clopidogrel (PLAVIX) 75 MG tablet Take 1 tablet (75 mg total) by mouth daily with breakfast. 09/02/16   Gouru, Illene Silver, MD  fenofibrate 160 MG tablet TAKE 1 TABLET BY MOUTH DAILY FOR HIGH TRIGLYCERIDES 11/07/16   [name], MD  glucose blood (BAYER CONTOUR TEST) test strip Use as instructed 02/28/13   [name], MD  loratadine (CLARITIN) 10 MG tablet Take 10 mg by mouth daily.    [provider]  losartan-hydrochlorothiazide (HYZAAR) 100-12.5 MG tablet Take 1 tablet by mouth daily. 01/26/17   Wellington Hampshire, MD  metFORMIN (GLUCOPHAGE-XR) 750 MG 24 hr tablet TAKE  1 TABLET BY MOUTH DAILY WITH BREAKFAST 10/18/16   [name], MD  metoprolol succinate (TOPROL-XL) 50 MG 24 hr tablet TAKE 1 TABLET BY MOUTH DAILY 01/08/17   [name], MD  mupirocin ointment (BACTROBAN) 2 % Apply 1 application topically 2 (two) times daily. To left earlobe 08/28/16   [name], MD  nitroGLYCERIN (NITROSTAT) 0.4 MG SL tablet Place 1 tablet (0.4 mg total) under the tongue every 5 (five) minutes as needed for chest pain. 09/02/16   Gouru, Illene Silver, MD  ondansetron (ZOFRAN ODT) 4 MG disintegrating tablet Take 1 tablet (4 mg total) by mouth every 8 (eight) hours as needed for nausea or vomiting. 11/01/16   Darel Hong, MD  pantoprazole (PROTONIX) 40 MG tablet Take 40 mg by  mouth daily.    [provider]  sertraline (ZOLOFT) 50 MG tablet Take 1 tablet (50 mg total) by mouth daily. AFTER DINNER 11/14/16   [name], MD    REVIEW OF SYSTEMS:  Review of Systems  Constitutional: Negative for chills, fever, malaise/fatigue and weight loss.  HENT: Negative for ear pain, hearing loss and tinnitus.   Eyes: Negative for blurred vision, double vision, pain and redness.  Respiratory: Negative for cough, hemoptysis and shortness of breath.   Cardiovascular: Negative for chest pain, palpitations, orthopnea and leg swelling.  Gastrointestinal: Negative for abdominal pain, constipation, diarrhea, nausea and vomiting.  Genitourinary: Negative for dysuria, frequency and hematuria.  Musculoskeletal: Negative for back pain, joint pain (Right Hip) and neck pain.  Skin:       No acne, rash, or lesions  Neurological: Negative for dizziness, tremors, focal weakness and weakness.  Endo/Heme/Allergies: Negative for polydipsia. Does not bruise/bleed easily.  Psychiatric/Behavioral: Negative for depression. The patient is not nervous/anxious and does not have insomnia.      VITAL SIGNS:   Vitals:   02/16/17 1931 02/16/17 1946 02/16/17 2019 02/16/17 2122  BP:   (!) 151/32 (!) 156/67  Pulse:   71 84  Resp:   18 (!) 23  Temp:      SpO2:  95% 98% 100%  Weight: 56.7 kg (125 lb)     Height: 5\' 3"  (1.6 m)      Wt Readings from Last 3 Encounters:  02/16/17 56.7 kg (125 lb)  01/26/17 58.4 kg (128 lb 12 oz)  01/05/17 56.2 kg (124 lb)    PHYSICAL EXAMINATION:  Physical Exam  Vitals reviewed. Constitutional: She is oriented to person, place, and time. She appears well-developed and well-nourished. No distress.  HENT:  Head: Normocephalic and atraumatic.  Mouth/Throat: Oropharynx is clear and moist.  Eyes: Pupils are equal, round, and reactive to light. Conjunctivae and EOM are normal. No scleral icterus.  Neck: Normal range of motion. Neck supple. No JVD present.  No thyromegaly present.  Cardiovascular: Normal rate, regular rhythm and intact distal pulses.  Exam reveals no gallop and no friction rub.   No murmur heard. Respiratory: Effort normal and breath sounds normal. No respiratory distress. She has no wheezes. She has no rales.  GI: Soft. Bowel sounds are normal. She exhibits no distension. There is no tenderness.  Musculoskeletal: Normal range of motion. She exhibits tenderness (Right hip). She exhibits no edema.  No arthritis, no gout  Lymphadenopathy:    She has no cervical adenopathy.  Neurological: She is alert and oriented to person, place, and time. No cranial nerve deficit.  No dysarthria, no aphasia  Skin: Skin is warm and dry. No rash noted.  No erythema.  Psychiatric: She has a normal mood and affect. Her behavior is normal. Judgment and thought content normal.    LABORATORY PANEL:   CBC  Recent Labs Lab 02/16/17 2000  WBC 8.4  HGB 10.1*  HCT 29.7*  PLT 292   ------------------------------------------------------------------------------------------------------------------  Chemistries   Recent Labs Lab 02/16/17 2000  NA 138  K 3.8  CL 105  CO2 22  GLUCOSE 197*  BUN 48*  CREATININE 1.21*  CALCIUM 9.4  AST 24  ALT 21  ALKPHOS 66  BILITOT 0.5   ------------------------------------------------------------------------------------------------------------------  Cardiac Enzymes No results for input(s): TROPONINI in the last 168 hours. ------------------------------------------------------------------------------------------------------------------  RADIOLOGY:  Dg Chest Port 1 View  Result Date: 02/16/2017 CLINICAL DATA:  Hip fracture preop EXAM: PORTABLE CHEST 1 VIEW COMPARISON:  11/02/2016 FINDINGS: Heart size upper normal. Vascularity normal. Prominent lung markings appear chronic. No focal infiltrate or effusion. IMPRESSION: No active disease. Electronically Signed   By: Franchot Gallo M.D.   On: 02/16/2017  20:53   Dg Hip Unilat  With Pelvis 2-3 Views Right  Result Date: 02/16/2017 CLINICAL DATA:  Right hip pain.  Fall EXAM: DG HIP (WITH OR WITHOUT PELVIS) 2-3V RIGHT COMPARISON:  None. FINDINGS: Right femoral neck fracture with angulation and displacement. Hip joint is normal. No other pelvic fracture IMPRESSION: Displaced fracture right femoral neck. Electronically Signed   By: Franchot Gallo M.D.   On: 02/16/2017 20:52    EKG:   Orders placed or performed during the hospital encounter of 02/16/17  . ED EKG  . ED EKG    IMPRESSION AND PLAN:  Principal Problem:   Closed displaced fracture of right femoral neck (HCC) - hip fracture after mechanical fall. Orthopedic surgery plans for operative repair. Cardiac risk stratification as below, patient has 2 risk factors with a history of prior MI and diabetes treated with insulin, though per report cardiac catheter after her MI did not show any coronary artery disease   Active Problems:   Essential hypertension - continue home meds   Type II diabetes mellitus with nephropathy (HCC) - sliding scale insulin with corresponding glucose checks   Hyperlipidemia - home meds   Generalized anxiety disorder - home dose anxiolytics  All the records are reviewed and case discussed with ED provider. Management plans discussed with the patient and/or family.  DVT PROPHYLAXIS: Mechanical only  GI PROPHYLAXIS: None  ADMISSION STATUS: Inpatient  CODE STATUS: DNR Code Status History    Date Active Date Inactive Code Status Order ID Comments User Context   11/01/2016  1:39 PM 12/31/2016  7:26 AM DNR [id number]  [name], MD Outpatient   11/01/2016 11:39 AM 11/01/2016  1:39 PM DNR [id number]  Hillary Bow, MD ED   08/30/2016 11:48 PM 09/02/2016  4:55 PM Full Code [id number]  Lance Coon, MD Inpatient    Questions for Most Recent Historical Code Status (Order [id number])    Question Answer Comment   In the event of cardiac or respiratory ARREST Do not call a  "code blue"    In the event of cardiac or respiratory ARREST Do not perform Intubation, CPR, defibrillation or ACLS    In the event of cardiac or respiratory ARREST Use medication by any route, position, wound care, and other measures to relive pain and suffering. May use oxygen, suction and manual treatment of airway obstruction as needed for comfort.         Advance Directive Documentation     Most Recent Value  Type  of Advance Directive  Living will, Healthcare Power of Attorney  Pre-existing out of facility DNR order (yellow form or pink MOST form)  -  "MOST" Form in Place?  -      TOTAL TIME TAKING CARE OF THIS PATIENT: 45 minutes.   [place] 02/16/2017, 9:59 PM  [employer]  [phone]  CC: Primary care physician; [name], MD  Note:  This document was prepared using Dragon voice recognition software and may include unintentional dictation errors.

## 2017-02-16 NOTE — Consult Note (Signed)
ORTHOPAEDIC CONSULTATION  REQUESTING PHYSICIAN: Darel Hong, MD  Chief Complaint: Right hip pain  HPI: Maureen Morales is a 83 y.o. female who complains of  Right hip pain and inability to bear weight after a ground level fall. She is a baseline Hydrographic surveyor and lives at home with her husband who requires continuous care.  Past Medical History:  Diagnosis Date  . CAD (coronary artery disease)   . Diabetes mellitus   . Hyperlipidemia   . Hypertension   . Myocardial infarction ([place]) 2018  . Viral gastroenteritis due to [place]-like agent Nov 2012   Past Surgical History:  Procedure Laterality Date  . ABDOMINAL HYSTERECTOMY    . BLADDER SURGERY    . CARDIAC CATHETERIZATION    . CHOLECYSTECTOMY    . LEFT HEART CATH AND CORONARY ANGIOGRAPHY N/A 08/31/2016   Procedure: Left Heart Cath and Coronary Angiography;  Surgeon: Wellington Hampshire, MD;  Location: [facility];  Service: Cardiovascular;  Laterality: N/A;   Social History   Social History  . Marital status: Married    Spouse name: N/A  . Number of children: N/A  . Years of education: N/A   Social History Main Topics  . Smoking status: Never Smoker  . Smokeless tobacco: Never Used  . Alcohol use No  . Drug use: No  . Sexual activity: Not Asked   Other Topics Concern  . None   Social History Narrative  . None   Family History  Problem Relation Age of Onset  . Cancer Mother   . Breast cancer Mother 83  . Breast cancer Sister 70  . Colon cancer Brother   . Colon cancer Brother    Allergies  Allergen Reactions  . Aspirin   . Atropine   . Belladonna Alkaloids   . Codeine   . Darvon   . Methocarbamol   . Penicillins   . Sulfa Antibiotics   . Iron Other (See Comments)    High dose prescription gives her diarrhea   Prior to Admission medications   Medication Sig Start Date End Date Taking? Authorizing Provider  amLODipine (NORVASC) 2.5 MG tablet Take 1 tablet (2.5 mg total) by mouth daily.  01/26/17 04/26/17  Wellington Hampshire, MD  atorvastatin (LIPITOR) 40 MG tablet TAKE 1 TABLET BY MOUTH DAILY AT 6PM 09/26/16   [name], MD  BD MICROTAINER LANCETS MISC Contact  Activated lancet 1.8 mm x 21 g Use once daily to check blood sugars 07/10/16   [name], MD  calcium carbonate (TUMS) 500 MG chewable tablet Chew 1 tablet by mouth. Taken when needed    [provider]  clopidogrel (PLAVIX) 75 MG tablet Take 1 tablet (75 mg total) by mouth daily with breakfast. 09/02/16   Gouru, Illene Silver, MD  fenofibrate 160 MG tablet TAKE 1 TABLET BY MOUTH DAILY FOR HIGH TRIGLYCERIDES 11/07/16   [name], MD  glucose blood (BAYER CONTOUR TEST) test strip Use as instructed 02/28/13   [name], MD  loratadine (CLARITIN) 10 MG tablet Take 10 mg by mouth daily.    [provider]  losartan-hydrochlorothiazide (HYZAAR) 100-12.5 MG tablet Take 1 tablet by mouth daily. 01/26/17   Wellington Hampshire, MD  metFORMIN (GLUCOPHAGE-XR) 750 MG 24 hr tablet TAKE 1 TABLET BY MOUTH DAILY WITH BREAKFAST 10/18/16   [name], MD  metoprolol succinate (TOPROL-XL) 50 MG 24 hr tablet TAKE 1 TABLET BY MOUTH DAILY 01/08/17   [name], MD  mupirocin ointment (BACTROBAN) 2 % Apply 1 application topically 2 (two) times daily. To left earlobe 08/28/16   [name], MD  nitroGLYCERIN (NITROSTAT) 0.4 MG SL tablet Place 1 tablet (0.4 mg total) under the tongue every 5 (five) minutes as needed for chest pain. 09/02/16   Gouru, Illene Silver, MD  ondansetron (ZOFRAN ODT) 4 MG disintegrating tablet Take 1 tablet (4 mg total) by mouth every 8 (eight) hours as needed for nausea or vomiting. 11/01/16   Darel Hong, MD  pantoprazole (PROTONIX) 40 MG tablet Take 40 mg by mouth daily.    [provider]  sertraline (ZOLOFT) 50 MG tablet Take 1 tablet (50 mg total) by mouth daily. AFTER DINNER 11/14/16   [name], MD   Dg Chest Port 1 View  Result Date: 02/16/2017 CLINICAL DATA:  Hip  fracture preop EXAM: PORTABLE CHEST 1 VIEW COMPARISON:  11/02/2016 FINDINGS: Heart size upper normal. Vascularity normal. Prominent lung markings appear chronic. No focal infiltrate or effusion. IMPRESSION: No active disease. Electronically Signed   By: Franchot Gallo M.D.   On: 02/16/2017 20:53   Dg Hip Unilat  With Pelvis 2-3 Views Right  Result Date: 02/16/2017 CLINICAL DATA:  Right hip pain.  Fall EXAM: DG HIP (WITH OR WITHOUT PELVIS) 2-3V RIGHT COMPARISON:  None. FINDINGS: Right femoral neck fracture with angulation and displacement. Hip joint is normal. No other pelvic fracture IMPRESSION: Displaced fracture right femoral neck. Electronically Signed   By: Franchot Gallo M.D.   On: 02/16/2017 20:52    Positive ROS: All other systems have been reviewed and were otherwise negative with the exception of those mentioned in the HPI and as above.  Physical Exam: General: Alert, no acute distress Cardiovascular: No pedal edema Respiratory: No cyanosis, no use of accessory musculature GI: No organomegaly, abdomen is soft and non-tender Skin: No lesions in the area of chief complaint Neurologic: Sensation intact distally Psychiatric: Patient is competent for consent with normal mood and affect Lymphatic: No axillary or cervical lymphadenopathy  MUSCULOSKELETAL: Shortened, externally rotated RLE with palpable DP/PT pulses and SILT.  Assessment: 83 y/o female with displaced right femoral neck fracture  Plan: Admit to hospitalist for medical optimization. Plan for surgery tomorrow pending medical clearance. Family was counseled regarding the risks and benefits of surgery and they agreed to proceed with surgery.       02/16/2017 9:45 PM

## 2017-02-16 NOTE — ED Notes (Signed)
Pt. States she tripped over dog.  Pt. States she fell on rt. Side.  Pt. Unable to extend rt. Leg.  Pt. Has skin tear on rt. Elbow bleeding controlled at this time.

## 2017-02-16 NOTE — ED Notes (Signed)
Xray in room with pt

## 2017-02-16 NOTE — ED Triage Notes (Signed)
Pt. States falling to her lt. Side while walking dog.  Pt. Unable to extend rt. Leg.  Pt. Has skin tear to rt. Elbow.

## 2017-02-17 ENCOUNTER — Inpatient Hospital Stay: Payer: Medicare Other | Admitting: Anesthesiology

## 2017-02-17 ENCOUNTER — Encounter
Admission: EM | Disposition: A | Discharge status: Skilled Nursing Facility | Payer: Self-pay | Source: Home / Self Care | Attending: Internal Medicine

## 2017-02-17 ENCOUNTER — Encounter: Payer: Self-pay | Admitting: Anesthesiology

## 2017-02-17 ENCOUNTER — Inpatient Hospital Stay: Payer: Medicare Other

## 2017-02-17 HISTORY — PX: HIP ARTHROPLASTY: SHX981

## 2017-02-17 LAB — CBC
HCT: 29 % — ABNORMAL LOW (ref 35.0–47.0)
Hemoglobin: 9.6 g/dL — ABNORMAL LOW (ref 12.0–16.0)
MCH: 28 pg (ref 26.0–34.0)
MCHC: 33.2 g/dL (ref 32.0–36.0)
MCV: 84.4 fL (ref 80.0–100.0)
Platelets: 273 10*3/uL (ref 150–440)
RBC: 3.44 MIL/uL — ABNORMAL LOW (ref 3.80–5.20)
RDW: 13.9 % (ref 11.5–14.5)
WBC: 13.1 10*3/uL — ABNORMAL HIGH (ref 3.6–11.0)

## 2017-02-17 LAB — BASIC METABOLIC PANEL
Anion gap: 6 (ref 5–15)
BUN: 45 mg/dL — ABNORMAL HIGH (ref 6–20)
CO2: 24 mmol/L (ref 22–32)
Calcium: 9 mg/dL (ref 8.9–10.3)
Chloride: 107 mmol/L (ref 101–111)
Creatinine, Ser: 1.16 mg/dL — ABNORMAL HIGH (ref 0.44–1.00)
GFR calc Af Amer: 46 mL/min — ABNORMAL LOW (ref >60)
GFR calc non Af Amer: 40 mL/min — ABNORMAL LOW (ref >60)
Glucose, Bld: 198 mg/dL — ABNORMAL HIGH (ref 65–99)
Potassium: 4 mmol/L (ref 3.5–5.1)
Sodium: 137 mmol/L (ref 135–145)

## 2017-02-17 LAB — SURGICAL PCR SCREEN
MRSA, PCR: NEGATIVE
Staphylococcus aureus: NEGATIVE

## 2017-02-17 LAB — GLUCOSE, CAPILLARY
GLUCOSE-CAPILLARY: 151 mg/dL — AB (ref 65–99)
Glucose-Capillary: 191 mg/dL — ABNORMAL HIGH (ref 65–99)
Glucose-Capillary: 180 mg/dL — ABNORMAL HIGH (ref 65–99)

## 2017-02-17 SURGERY — HEMIARTHROPLASTY, HIP, DIRECT ANTERIOR APPROACH, FOR FRACTURE
Anesthesia: General | Laterality: Right

## 2017-02-17 MED ORDER — FENTANYL CITRATE (PF) 100 MCG/2ML IJ SOLN
INTRAMUSCULAR | Status: AC
  Filled 2017-02-17: qty 2

## 2017-02-17 MED ORDER — FENTANYL CITRATE (PF) 100 MCG/2ML IJ SOLN
25.0000 ug | INTRAMUSCULAR | Status: DC | PRN
  Administered 2017-02-17 (×3): 25 ug via INTRAVENOUS

## 2017-02-17 MED ORDER — TRANEXAMIC ACID 1000 MG/10ML IV SOLN
INTRAVENOUS | Status: AC
  Filled 2017-02-17: qty 20

## 2017-02-17 MED ORDER — ONDANSETRON HCL 4 MG/2ML IJ SOLN: 4.0000 mg | Freq: Once | INTRAMUSCULAR | Status: DC | PRN

## 2017-02-17 MED ORDER — DEXAMETHASONE SODIUM PHOSPHATE 10 MG/ML IJ SOLN
INTRAMUSCULAR | Status: AC
  Filled 2017-02-17: qty 1

## 2017-02-17 MED ORDER — SODIUM CHLORIDE 0.9 % IV SOLN
INTRAVENOUS | Status: DC
  Administered 2017-02-17 (×2): via INTRAVENOUS

## 2017-02-17 MED ORDER — ONDANSETRON HCL 4 MG/2ML IJ SOLN
INTRAMUSCULAR | Status: AC
  Filled 2017-02-17: qty 2

## 2017-02-17 MED ORDER — SEVOFLURANE IN SOLN
RESPIRATORY_TRACT | Status: AC
  Filled 2017-02-17: qty 250

## 2017-02-17 MED ORDER — LIDOCAINE HCL (CARDIAC) 20 MG/ML IV SOLN
INTRAVENOUS | Status: DC | PRN
  Administered 2017-02-17: 80 mg via INTRAVENOUS

## 2017-02-17 MED ORDER — PHENYLEPHRINE HCL 10 MG/ML IJ SOLN
INTRAMUSCULAR | Status: DC | PRN
  Administered 2017-02-17: 25 ug/min via INTRAVENOUS

## 2017-02-17 MED ORDER — SUCCINYLCHOLINE CHLORIDE 20 MG/ML IJ SOLN
INTRAMUSCULAR | Status: DC | PRN
  Administered 2017-02-17: 80 mg via INTRAVENOUS
  Administered 2017-02-17: 40 mg via INTRAVENOUS

## 2017-02-17 MED ORDER — DEXAMETHASONE SODIUM PHOSPHATE 10 MG/ML IJ SOLN
INTRAMUSCULAR | Status: DC | PRN
  Administered 2017-02-17: 5 mg via INTRAVENOUS

## 2017-02-17 MED ORDER — PNEUMOCOCCAL VAC POLYVALENT 25 MCG/0.5ML IJ INJ
0.5000 mL | INJECTION | INTRAMUSCULAR | Status: AC
  Administered 2017-02-18: 0.5 mL via INTRAMUSCULAR
  Filled 2017-02-17: qty 0.5

## 2017-02-17 MED ORDER — ORAL CARE MOUTH RINSE
15.0000 mL | Freq: Two times a day (BID) | OROMUCOSAL | Status: DC
  Administered 2017-02-18: 15 mL via OROMUCOSAL

## 2017-02-17 MED ORDER — SUGAMMADEX SODIUM 200 MG/2ML IV SOLN
INTRAVENOUS | Status: DC | PRN
  Administered 2017-02-17: 113.4 mg via INTRAVENOUS

## 2017-02-17 MED ORDER — LIDOCAINE HCL (PF) 2 % IJ SOLN
INTRAMUSCULAR | Status: AC
  Filled 2017-02-17: qty 10

## 2017-02-17 MED ORDER — SUCCINYLCHOLINE CHLORIDE 20 MG/ML IJ SOLN
INTRAMUSCULAR | Status: AC
  Filled 2017-02-17: qty 1

## 2017-02-17 MED ORDER — PROPOFOL 10 MG/ML IV BOLUS
INTRAVENOUS | Status: AC
  Filled 2017-02-17: qty 20

## 2017-02-17 MED ORDER — CEFAZOLIN SODIUM-DEXTROSE 2-3 GM-%(50ML) IV SOLR
INTRAVENOUS | Status: DC | PRN
  Administered 2017-02-17: 2 g via INTRAVENOUS

## 2017-02-17 MED ORDER — EPINEPHRINE PF 1 MG/ML IJ SOLN
INTRAMUSCULAR | Status: AC
  Filled 2017-02-17: qty 1

## 2017-02-17 MED ORDER — ROCURONIUM BROMIDE 100 MG/10ML IV SOLN
INTRAVENOUS | Status: DC | PRN
  Administered 2017-02-17: 10 mg via INTRAVENOUS

## 2017-02-17 MED ORDER — ROCURONIUM BROMIDE 50 MG/5ML IV SOLN
INTRAVENOUS | Status: AC
  Filled 2017-02-17: qty 1

## 2017-02-17 MED ORDER — FENTANYL CITRATE (PF) 100 MCG/2ML IJ SOLN
INTRAMUSCULAR | Status: DC | PRN
  Administered 2017-02-17 (×2): 50 ug via INTRAVENOUS

## 2017-02-17 MED ORDER — PROPOFOL 10 MG/ML IV BOLUS
INTRAVENOUS | Status: DC | PRN
  Administered 2017-02-17: 30 mg via INTRAVENOUS
  Administered 2017-02-17: 20 mg via INTRAVENOUS
  Administered 2017-02-17: 30 mg via INTRAVENOUS

## 2017-02-17 MED ORDER — LACTATED RINGERS IV SOLN
INTRAVENOUS | Status: DC | PRN
  Administered 2017-02-17 (×2): via INTRAVENOUS

## 2017-02-17 MED ORDER — PHENYLEPHRINE HCL 10 MG/ML IJ SOLN
INTRAMUSCULAR | Status: AC
  Filled 2017-02-17: qty 1

## 2017-02-17 SURGICAL SUPPLY — 53 items
BAG DECANTER FOR FLEXI CONT (MISCELLANEOUS) ×2 IMPLANT
BLADE SAW 1 (BLADE) ×2 IMPLANT
BRUSH SCRUB EZ  4% CHG (MISCELLANEOUS) ×1
BRUSH SCRUB EZ 4% CHG (MISCELLANEOUS) ×1 IMPLANT
CANISTER SUCT 1200ML W/VALVE (MISCELLANEOUS) ×2 IMPLANT
CANISTER SUCT 3000ML PPV (MISCELLANEOUS) ×4 IMPLANT
CAPT HIP HEMI 1 ×2 IMPLANT
CATH FOL LEG HOLDER (MISCELLANEOUS) ×2 IMPLANT
CEMENT HV SMART SET (Cement) ×4 IMPLANT
DRAPE IMP U-DRAPE 54X76 (DRAPES) ×2 IMPLANT
DRAPE INCISE IOBAN 66X60 STRL (DRAPES) ×2 IMPLANT
DRAPE SURG 17X23 STRL (DRAPES) ×2 IMPLANT
DRAPE TABLE BACK 80X90 (DRAPES) ×2 IMPLANT
DRAPE U-SHAPE 47X51 STRL (DRAPES) ×2 IMPLANT
DRSG AQUACEL AG ADV 3.5X10 (GAUZE/BANDAGES/DRESSINGS) ×2 IMPLANT
DRSG AQUACEL AG ADV 3.5X14 (GAUZE/BANDAGES/DRESSINGS) ×2 IMPLANT
DRSG DERMACEA 8X12 NADH (GAUZE/BANDAGES/DRESSINGS) ×2 IMPLANT
DURAPREP 26ML APPLICATOR (WOUND CARE) ×2 IMPLANT
ELECT REM PT RETURN 9FT ADLT (ELECTROSURGICAL) ×2
ELECTRODE REM PT RTRN 9FT ADLT (ELECTROSURGICAL) ×1 IMPLANT
EVACUATOR 1/8 PVC DRAIN (DRAIN) ×2 IMPLANT
GAUZE PACK 2X3YD (MISCELLANEOUS) ×2 IMPLANT
GAUZE SPONGE 4X4 12PLY STRL (GAUZE/BANDAGES/DRESSINGS) ×2 IMPLANT
GLOVE BIO SURGEON STRL SZ7.5 (GLOVE) ×2 IMPLANT
GLOVE INDICATOR 8.0 STRL GRN (GLOVE) ×2 IMPLANT
GOWN STRL REUS W/ TWL LRG LVL3 (GOWN DISPOSABLE) ×2 IMPLANT
GOWN STRL REUS W/TWL LRG LVL3 (GOWN DISPOSABLE) ×2
KIT RM TURNOVER STRD PROC AR (KITS) ×2 IMPLANT
NDL SAFETY 18GX1.5 (NEEDLE) ×2 IMPLANT
NEEDLE FILTER BLUNT 18X 1/2SAF (NEEDLE) ×1
NEEDLE FILTER BLUNT 18X1 1/2 (NEEDLE) ×1 IMPLANT
NS IRRIG 1000ML POUR BTL (IV SOLUTION) ×2 IMPLANT
PACK HIP PROSTHESIS (MISCELLANEOUS) ×2 IMPLANT
PILLOW ABDUC M (MISCELLANEOUS) ×2 IMPLANT
PRESSURIZER CEMENT PROX FEM SM (MISCELLANEOUS) ×2 IMPLANT
PRESSURIZER FEM CANAL M (MISCELLANEOUS) ×2 IMPLANT
PULSAVAC PLUS IRRIG FAN TIP (DISPOSABLE) ×2
SOL .9 NS 3000ML IRR  AL (IV SOLUTION) ×1
SOL .9 NS 3000ML IRR UROMATIC (IV SOLUTION) ×1 IMPLANT
STAPLER SKIN PROX 35W (STAPLE) ×2 IMPLANT
SUT ETHIBOND #5 BRAIDED 30INL (SUTURE) ×4 IMPLANT
SUT VIC AB 0 CT1 36 (SUTURE) ×4 IMPLANT
SUT VIC AB 2-0 CT1 27 (SUTURE) ×1
SUT VIC AB 2-0 CT1 TAPERPNT 27 (SUTURE) ×1 IMPLANT
SUT VIC AB 2-0 SH 27 (SUTURE) ×1
SUT VIC AB 2-0 SH 27XBRD (SUTURE) ×1 IMPLANT
SYR 20CC LL (SYRINGE) ×2 IMPLANT
SYR TB 1ML LUER SLIP (SYRINGE) ×2 IMPLANT
TAPE MICROFOAM 4IN (TAPE) ×2 IMPLANT
TIP COAXIAL FEMORAL CANAL (MISCELLANEOUS) ×2 IMPLANT
TIP FAN IRRIG PULSAVAC PLUS (DISPOSABLE) ×1 IMPLANT
TOWER CARTRIDGE SMART MIX (DISPOSABLE) ×2 IMPLANT
WATER STERILE IRR 1000ML POUR (IV SOLUTION) ×2 IMPLANT

## 2017-02-17 NOTE — Progress Notes (Signed)
Dr. Daine Gip at bedside, xray present also, looking at [facility].  Patient able to move all extremities wnl,

## 2017-02-17 NOTE — NC FL2 (Signed)
[place] LEVEL OF CARE SCREENING TOOL     IDENTIFICATION  Patient Name: Maureen Morales Birthdate: 04/16/1923 Sex: female Admission Date (Current Location): 02/16/2017  [place] and [place] Number:  Engineering geologist and Address:  [facility], [address] [place], [place] [address]      Provider Number: [id number]  Attending Physician Name and Address:  Fritzi Mandes, MD  Relative Name and Phone Number:  Shakina Choy (daughter) [phone]    Current Level of Care: Hospital Recommended Level of Care: [place] Prior Approval Number:    Date Approved/Denied: 02/17/17 PASRR Number: [id number]  Discharge Plan: SNF    Current Diagnoses: Patient Active Problem List   Diagnosis Date Noted  . Closed displaced fracture of right femoral neck ([place]) 02/16/2017  . Diarrhea of infectious origin 11/28/2016  . Abnormal CT scan, colon   . Generalized anxiety disorder 09/07/2016  . Unsteady gait 09/07/2016  . Hospital discharge follow-up 09/07/2016  . NSTEMI (non-ST elevated myocardial infarction) ([place]) 08/30/2016  . Pain in joint, ankle and foot 06/24/2014  . S/P laparoscopic cholecystectomy 12/15/2013  . Preoperative general physical examination 12/15/2013  . Encounter for preventive health examination 11/13/2013  . Cervical spine disease 01/27/2013  . Anemia, iron deficiency 01/21/2013  . Right medial knee pain 01/09/2013  . Back pain, thoracic 08/27/2012  . Aortic stenosis, mild 05/06/2012  . Asthma 07/04/2011  . Hemorrhoids, internal 04/05/2011  . Nummular eczema 01/16/2011  . Essential hypertension   . Type II diabetes mellitus with nephropathy ([place])   . Hyperlipidemia     Orientation RESPIRATION BLADDER Height & Weight     Self, Time, Situation, Place  Normal Continent Weight: 125 lb (56.7 kg) Height:  5\' 3"  (160 cm)  BEHAVIORAL SYMPTOMS/MOOD NEUROLOGICAL BOWEL NUTRITION STATUS      Continent     AMBULATORY STATUS COMMUNICATION OF NEEDS Skin   Extensive Assist Verbally Surgical wounds                       Personal Care Assistance Level of Assistance  Bathing, Feeding, Dressing Bathing Assistance: Limited assistance Feeding assistance: Independent Dressing Assistance: Limited assistance     Functional Limitations Info             SPECIAL CARE FACTORS FREQUENCY  PT (By licensed PT)     PT Frequency: Up to 5X per day, 5 days per week              Contractures Contractures Info: Not present    Additional Factors Info  Code Status, Allergies Code Status Info: DNR Allergies Info:  Aspirin, Atropine, Belladonna Alkaloids, Codeine, Darvon, Methocarbamol, Penicillins, Sulfa Antibiotics, Iron           Current Medications (02/17/2017):  This is the current hospital active medication list Current Facility-Administered Medications  Medication Dose Route Frequency Provider Last Rate Last Dose  . 0.9 %  sodium chloride infusion   Intravenous Continuous Harrie Foreman, MD 75 mL/hr at 02/17/17 [phone]    . [MAR Hold] acetaminophen (TYLENOL) tablet 650 mg  650 mg Oral Q6H PRN Lance Coon, MD       Or  . Doug Sou Hold] acetaminophen (TYLENOL) suppository 650 mg  650 mg Rectal Q6H PRN Lance Coon, MD      . Doug Sou Hold] amLODipine (NORVASC) tablet 5 mg  5 mg Oral Daily Lance Coon, MD      . Doug Sou Hold] atorvastatin (LIPITOR) tablet 40 mg  40 mg Oral q1800 Lance Coon, MD      . Doug Sou Hold] clopidogrel (PLAVIX) tablet 75 mg  75 mg Oral Q breakfast Lance Coon, MD      . Doug Sou Hold] diphenhydrAMINE (BENADRYL) capsule 25 mg  25 mg Oral Once Lance Coon, MD   Stopped at 02/17/17 0005  . [MAR Hold] losartan (COZAAR) tablet 100 mg  100 mg Oral Daily Lance Coon, MD       And  . Doug Sou Hold] hydrochlorothiazide (MICROZIDE) capsule 12.5 mg  12.5 mg Oral Daily Lance Coon, MD      . Doug Sou Hold] HYDROcodone-acetaminophen (NORCO/VICODIN) 5-325 MG per tablet 1-2 tablet   1-2 tablet Oral Q4H PRN Lance Coon, MD   1 tablet at 02/17/17 0114  . [MAR Hold] insulin aspart (novoLOG) injection 0-9 Units  0-9 Units Subcutaneous Q6H Lance Coon, MD   2 Units at 02/17/17 0606  . [facility] Hold] MEDLINE mouth rinse  15 mL Mouth Rinse BID Lance Coon, MD      . Doug Sou Hold] metoprolol succinate (TOPROL-XL) 24 hr tablet 50 mg  50 mg Oral Daily Lance Coon, MD      . Doug Sou Hold] morphine 4 MG/ML injection 4 mg  4 mg Intravenous Q4H PRN Lance Coon, MD   4 mg at 02/16/17 2352  . [MAR Hold] ondansetron (ZOFRAN) tablet 4 mg  4 mg Oral Q6H PRN Lance Coon, MD       Or  . Doug Sou Hold] ondansetron [facility]) injection 4 mg  4 mg Intravenous Q6H PRN Lance Coon, MD   4 mg at 02/17/17 1003  . [MAR Hold] pantoprazole (PROTONIX) EC tablet 40 mg  40 mg Oral Daily Lance Coon, MD      . Doug Sou Hold] pneumococcal 23 valent vaccine (PNU-IMMUNE) injection 0.5 mL  0.5 mL Intramuscular Tomorrow-1000 Lance Coon, MD      . Doug Sou Hold] sertraline (ZOLOFT) tablet 50 mg  50 mg Oral Daily Lance Coon, MD   50 mg at 02/16/17 2358   Facility-Administered Medications Ordered in Other Encounters  Medication Dose Route Frequency Provider Last Rate Last Dose  . ceFAZolin (ANCEF) IVPB 2 g/50 mL premix   Intravenous Anesthesia Intra-op Nelda Marseille, CRNA   2 g at 02/17/17 0949  . dexamethasone (DECADRON) injection   Intravenous Anesthesia Intra-op Nelda Marseille, CRNA   5 mg at 02/17/17 1003  . fentaNYL (SUBLIMAZE) injection    Anesthesia Intra-op Nelda Marseille, CRNA   50 mcg at 02/17/17 1007  . lactated ringers infusion    Continuous PRN Noles, Mark, CRNA      . lidocaine (cardiac) 100 mg/61ml (XYLOCAINE) 20 MG/ML injection 2%   Intravenous Anesthesia Intra-op Nelda Marseille, CRNA   80 mg at 02/17/17 0937  . phenylephrine (NEO-SYNEPHRINE) 100 mcg/mL in sodium chloride 0.9 % 100 mL infusion   Intravenous Continuous PRN Noles, Elta [place], CRNA 6 mL/hr at 02/17/17 1058 10 mcg/min at 02/17/17 1058  . propofol (DIPRIVAN)  10 mg/mL bolus/IV push    Anesthesia Intra-op Nelda Marseille, CRNA   30 mg at 02/17/17 1037  . rocuronium [facility]) injection    Anesthesia Intra-op Nelda Marseille, CRNA   10 mg at 02/17/17 7616  . succinylcholine (ANECTINE) injection   Intravenous Anesthesia Intra-op Nelda Marseille, CRNA   40 mg at 02/17/17 1037     Discharge Medications: Please see discharge summary for a list of discharge medications.  Relevant Imaging Results:  Relevant Lab Results:   Additional Information SS# [id number]  Zettie Pho,  LCSW

## 2017-02-17 NOTE — Progress Notes (Signed)
Pt alert and oriented. Resting in bed. Family at bedside.  Posterior hip precautions in place. IV patent with NS running at 78mL/hr. Pt on 3L oxygen acute. Complaints of pain in right hip. Hydrocodone given at 1424. No other complaints at this time. Will continue to monitor.

## 2017-02-17 NOTE — Anesthesia Post-op Follow-up Note (Signed)
Anesthesia QCDR form completed.        

## 2017-02-17 NOTE — Anesthesia Preprocedure Evaluation (Signed)
Anesthesia Evaluation  Patient identified by MRN, date of birth, ID band Patient awake    Reviewed: Allergy & Precautions, NPO status , Patient's Chart, lab work & pertinent test results, reviewed documented beta blocker date and time   Airway Mallampati: II  TM Distance: >3 FB     Dental  (+) Chipped   Pulmonary asthma ,           Cardiovascular hypertension, Pt. on medications and Pt. on home beta blockers + CAD and + Past MI       Neuro/Psych PSYCHIATRIC DISORDERS Anxiety    GI/Hepatic   Endo/Other  diabetes, Type 2  Renal/GU Renal disease     Musculoskeletal   Abdominal   Peds  Hematology  (+) anemia ,   Anesthesia Other Findings Does not take NTG. Took blood thinner yesterday. No cardiac stent. CXR ok. Hb 9.6.  Reproductive/Obstetrics                             Anesthesia Physical Anesthesia Plan  ASA: III  Anesthesia Plan: General   Post-op Pain Management:    Induction: Intravenous  PONV Risk Score and Plan:   Airway Management Planned: Oral ETT  Additional Equipment:   Intra-op Plan:   Post-operative Plan:   Informed Consent: I have reviewed the patients History and Physical, chart, labs and discussed the procedure including the risks, benefits and alternatives for the proposed anesthesia with the patient or authorized representative who has indicated his/her understanding and acceptance.     Plan Discussed with: CRNA  Anesthesia Plan Comments:         Anesthesia Quick Evaluation

## 2017-02-17 NOTE — Op Note (Signed)
02/16/2017 - 02/17/2017  11:39 AM  PATIENT:  Maureen Morales   MRN: [id number]  PRE-OPERATIVE DIAGNOSIS:  fractured hip right   POST-OPERATIVE DIAGNOSIS:  same  PROCEDURE:  Procedure(s): ARTHROPLASTY UNIPOLAR HIP (HEMIARTHROPLASTY)  PREOPERATIVE INDICATIONS:  Maureen Morales is an 83 y.o. female who was admitted 02/16/2017 with a diagnosis of Closed displaced fracture of right femoral neck ([place]) and elected for surgical management.  The risks benefits and alternatives were discussed with the patient including but not limited to the risks of nonoperative treatment, versus surgical intervention including infection, bleeding, nerve injury, periprosthetic fracture, the need for revision surgery, dislocation, leg length discrepancy, blood clots, cardiopulmonary complications, morbidity, mortality, among others, and they were willing to proceed.  Predicted outcome is good, although there will be at least a six to nine month expected recovery.   OPERATIVE REPORT     SURGEON:  Stacy Gardner, MD    ASSISTANT:  NONE  ANESTHESIA:  General  EBL: 200  IVF: I liter  ABX: 2 g ancef  TXA: 1 gm prior to start and 1 gm at closure    COMPLICATIONS:  None.      COMPONENTS:  Depuy [place] Basic Femoral Fracture stem size 2, with a stem centralizer, a cement restrictor, with a -27m spacer and a size 46 fracture head unipolar hip ball, and a total of 2 bags of Depuy CMW 1 Bone Cement.    PROCEDURE IN DETAIL: The patient was met in the holding area and identified.  The appropriate hip  was marked at the operative site. The patient was then transported to the OR and  placed under general anesthesia.  At that point, the patient was  placed in the lateral decubitus position with the operative side up and  secured to the operating room table and all bony prominences padded.     The operative lower extremity was prepped from the iliac crest to the toes.  Sterile draping was performed.  Time out was performed  prior to incision.      A routine posterolateral approach was utilized via sharp dissection  carried down to the subcutaneous tissue.  Gross bleeders were Bovie  coagulated.  The iliotibial band was identified and incised  along the length of the skin incision.  Self-retaining retractors were  inserted.  With the hip internally rotated, the short external rotators  were identified. The piriformis was tagged with ethibond, and the hip capsule released in a T-type fashion.  The femoral neck was exposed, and I resected the femoral neck using the appropriate jig. This was performed at approximately a thumb's breadth above the lesser trochanter.    I then exposed the deep acetabulum, cleared out any tissue including the ligamentum teres,   I then prepared the proximal femur using the cookie-cutter, the lateralizing reamer, and then sequentially broached.  A trial utilized, and I reduced the hip and it was found to have excellent stability with functional range of motion. The trial components were then removed.   I then placed a cement restrictor, and cemented the real components in place. All excess cement was removed. Once the cement had cured, I impacted the real head ball into place. The hip was then reduced and taken through functional range of motion and found to have excellent stability. Leg lengths were restored.  Posterior capsular repair was achieved with ethibond suture.  I then irrigated the hip copiously again with pulse lavage, and repaired the fascia with Vicryl, followed by Vicryl for  the subcutaneous tissue, and staples for the skin. The patient was then awakened and returned to PACU in stable and satisfactory condition. There were no complications.    02/17/2017 11:39 AM

## 2017-02-17 NOTE — Progress Notes (Signed)
[place] [facility] [place] NAME: Maureen Morales    MR#:  [id number]  DATE OF BIRTH:  1922-06-15  SUBJECTIVE:  Right hip pain  REVIEW OF SYSTEMS:   Review of Systems  Constitutional: Negative for chills, fever and weight loss.  HENT: Negative for ear discharge, ear pain and nosebleeds.   Eyes: Negative for blurred vision, pain and discharge.  Respiratory: Negative for sputum production, shortness of breath, wheezing and stridor.   Cardiovascular: Negative for chest pain, palpitations, orthopnea and PND.  Gastrointestinal: Negative for abdominal pain, diarrhea, nausea and vomiting.  Genitourinary: Negative for frequency and urgency.  Musculoskeletal: Positive for joint pain. Negative for back pain.  Neurological: Positive for weakness. Negative for sensory change, speech change and focal weakness.  Psychiatric/Behavioral: Negative for depression and hallucinations. The patient is not nervous/anxious.    Tolerating Diet:npo Tolerating PT: pending  DRUG ALLERGIES:   Allergies  Allergen Reactions  . Aspirin Swelling  . Atropine Other (See Comments)    Reaction: unknown  . Belladonna Alkaloids Other (See Comments)    Reaction: unknown  . Codeine Other (See Comments)    Reaction: unknown  . Darvon Other (See Comments)    Reaction: unknown  . Methocarbamol Other (See Comments)    Reaction: unknown  . Penicillins Other (See Comments)    Reaction: unknown Has patient had a PCN reaction causing immediate rash, facial/tongue/throat swelling, SOB or lightheadedness with hypotension: Unknown Has patient had a PCN reaction causing severe rash involving mucus membranes or skin necrosis: Unknown Has patient had a PCN reaction that required hospitalization: Unknown Has patient had a PCN reaction occurring within the last 10 years: Unknown If all of the above answers are "NO", then may proceed with Cephalosporin use.   . Sulfa Antibiotics Other  (See Comments)    Reaction: unknown  . Iron Other (See Comments)    High dose prescription gives her diarrhea    VITALS:  Blood pressure 106/86, pulse 68, temperature 98.4 F (36.9 C), resp. rate 14, height 5\' 3"  (1.6 m), weight 56.7 kg (125 lb), SpO2 100 %.  PHYSICAL EXAMINATION:   Physical Exam  GENERAL:  83 y.o.-year-old patient lying in the bed with no acute distress.  EYES: Pupils equal, round, reactive to light and accommodation. No scleral icterus. Extraocular muscles intact.  HEENT: Head atraumatic, normocephalic. Oropharynx and nasopharynx clear.  NECK:  Supple, no jugular venous distention. No thyroid enlargement, no tenderness.  LUNGS: Normal breath sounds bilaterally, no wheezing, rales, rhonchi. No use of accessory muscles of respiration.  CARDIOVASCULAR: S1, S2 normal. No murmurs, rubs, or gallops.  ABDOMEN: Soft, nontender, nondistended. Bowel sounds present. No organomegaly or mass.  EXTREMITIES: No cyanosis, clubbing or edema b/l.   Buck's traction NEUROLOGIC: Cranial nerves II through XII are intact. No focal Motor or sensory deficits b/l.   PSYCHIATRIC:  patient is alert and oriented x 3.  SKIN: No obvious rash, lesion, or ulcer.   LABORATORY PANEL:  CBC  Recent Labs Lab 02/17/17 0349  WBC 13.1*  HGB 9.6*  HCT 29.0*  PLT 273    Chemistries   Recent Labs Lab 02/16/17 2000 02/17/17 0349  NA 138 137  K 3.8 4.0  CL 105 107  CO2 22 24  GLUCOSE 197* 198*  BUN 48* 45*  CREATININE 1.21* 1.16*  CALCIUM 9.4 9.0  AST 24  --   ALT 21  --   ALKPHOS 66  --   BILITOT 0.5  --  Cardiac Enzymes No results for input(s): TROPONINI in the last 168 hours. RADIOLOGY:  Dg Pelvis Portable  Result Date: 02/17/2017 CLINICAL DATA:  Right hip replacement EXAM: PORTABLE PELVIS 1-2 VIEWS COMPARISON:  None. FINDINGS: Right hip arthroplasty without failure or complication. No hardware failure or complication. Postsurgical changes in the surrounding soft tissues. No  fracture or dislocation.  Generalized osteopenia. IMPRESSION: Interval right hip arthroplasty without failure or complication. Electronically Signed   By: Kathreen Devoid   On: 02/17/2017 13:47   Dg Chest Port 1 View  Result Date: 02/16/2017 CLINICAL DATA:  Hip fracture preop EXAM: PORTABLE CHEST 1 VIEW COMPARISON:  11/02/2016 FINDINGS: Heart size upper normal. Vascularity normal. Prominent lung markings appear chronic. No focal infiltrate or effusion. IMPRESSION: No active disease. Electronically Signed   By: Franchot Gallo M.D.   On: 02/16/2017 20:53   Dg Hip Unilat  With Pelvis 2-3 Views Right  Result Date: 02/16/2017 CLINICAL DATA:  Right hip pain.  Fall EXAM: DG HIP (WITH OR WITHOUT PELVIS) 2-3V RIGHT COMPARISON:  None. FINDINGS: Right femoral neck fracture with angulation and displacement. Hip joint is normal. No other pelvic fracture IMPRESSION: Displaced fracture right femoral neck. Electronically Signed   By: Franchot Gallo M.D.   On: 02/16/2017 20:52   ASSESSMENT AND PLAN:  Maureen Morales  is a 83 y.o. female who presents with fall at home when she tripped over her dog and subsequent right hip fracture.  *Closed displaced fracture of right femoral neck (HCC) - hip fracture after mechanical fall.  -Orthopedic surgery plans for operative repair.  -Cardiac risk stratification as below, patient has 2 risk factors with a history of prior MI and diabetes treated with insulin, though per report cardiac catheterization after her MI did not show any coronary artery disease -pt is at low to intermediate risk for CAD  *Essential hypertension - continue home meds  * Type II diabetes mellitus with nephropathy ([place]) - sliding scale insulin with corresponding glucose checks   * Hyperlipidemia - home meds   * Generalized anxiety disorder - home dose anxiolytics  Case discussed with Care Management/Social Worker. Management plans discussed with the patient, family and they are in agreement.  CODE  STATUS: DNR  DVT Prophylaxis: after surgery  TOTAL TIME TAKING CARE OF THIS PATIENT: *30* minutes.  >50% time spent on counselling and coordination of care  POSSIBLE D/C IN **2-3 DAYS, DEPENDING ON CLINICAL CONDITION.  Note: This dictation was prepared with Dragon dictation along with smaller phrase technology. Any transcriptional errors that result from this process are unintentional.  Tresha Muzio M.D on 02/17/2017 at 2:08 PM  Between 7am to 6pm - Pager - [phone]  After 6pm go to [email or web address] - password [email or web address] [facility]  Office  [phone]  CC: Primary care physician; [name], MDPatient ID: [name], female   DOB: 18-Dec-1922, 83 y.o.   MRN: [id number]

## 2017-02-17 NOTE — Anesthesia Procedure Notes (Signed)
Procedure Name: Intubation Date/Time: 02/17/2017 10:02 AM Performed by: Nelda Marseille Pre-anesthesia Checklist: Patient identified, Patient being monitored, Timeout performed, Emergency Drugs available and Suction available Patient Re-evaluated:Patient Re-evaluated prior to induction Oxygen Delivery Method: Circle system utilized Preoxygenation: Pre-oxygenation with 100% oxygen Induction Type: IV induction Ventilation: Mask ventilation without difficulty Laryngoscope Size: Mac and 3 Grade View: Grade II Tube type: Oral Tube size: 7.0 mm Number of attempts: 1 Airway Equipment and Method: Stylet Placement Confirmation: ETT inserted through vocal cords under direct vision,  positive ETCO2 and breath sounds checked- equal and bilateral Secured at: 21 cm Tube secured with: Tape Dental Injury: Teeth and Oropharynx as per pre-operative assessment

## 2017-02-17 NOTE — Clinical Social Work Note (Signed)
CSW received consult for SNF placement. CSW will assess pending PT recommendation.  Santiago Bumpers, MSW, Latanya Presser [phone]

## 2017-02-17 NOTE — Progress Notes (Signed)
Extremities warm to touch, pink in color, movement Wnl. Abduction pillow in place from OR.

## 2017-02-17 NOTE — Progress Notes (Signed)
Per Anesthesiologist do not given morning dose of scheduled metoprolol

## 2017-02-17 NOTE — Transfer of Care (Signed)
Immediate Anesthesia Transfer of Care Note  Patient: Maureen Morales  Procedure(s) Performed: ARTHROPLASTY BIPOLAR HIP (HEMIARTHROPLASTY) (Right )  Patient Location: PACU  Anesthesia Type:General  Level of Consciousness: sedated  Airway & Oxygen Therapy: Patient Spontanous Breathing and Patient connected to face mask oxygen  Post-op Assessment: Report given to RN and Post -op Vital signs reviewed and stable  Post vital signs: Reviewed and stable  Last Vitals:  Vitals:   02/17/17 0740 02/17/17 0854  BP: (!) 138/54 (!) 140/54  Pulse: 67 65  Resp: 16   Temp: 36.7 C   SpO2: 99% 98%    Last Pain:  Vitals:   02/17/17 0740  TempSrc: Oral  PainSc:       Patients Stated Pain Goal: 4 (15/04/13 6438)  Complications: No apparent anesthesia complications

## 2017-02-18 LAB — GLUCOSE, CAPILLARY
GLUCOSE-CAPILLARY: 236 mg/dL — AB (ref 65–99)
GLUCOSE-CAPILLARY: 200 mg/dL — AB (ref 65–99)
Glucose-Capillary: 124 mg/dL — ABNORMAL HIGH (ref 65–99)
Glucose-Capillary: 130 mg/dL — ABNORMAL HIGH (ref 65–99)

## 2017-02-18 MED ORDER — ENOXAPARIN SODIUM 30 MG/0.3ML ~~LOC~~ SOLN
30.0000 mg | SUBCUTANEOUS | Status: DC
  Administered 2017-02-18 – 2017-02-19 (×2): 30 mg via SUBCUTANEOUS
  Filled 2017-02-18 (×2): qty 0.3

## 2017-02-18 NOTE — Clinical Social Work Note (Signed)
Clinical Social Work Assessment  Patient Details  Name: Maureen Morales MRN: [id number] Date of Birth: 09/01/1922  Date of referral:  02/18/17               Reason for consult:  Facility Placement                Permission sought to share information with:  Facility Contact Representative Permission granted to share information::  Yes, Verbal Permission Granted  Name::        Agency::     Relationship::     Contact Information:     Housing/Transportation Living arrangements for the past 2 months:  Single Family Home Source of Information:  Adult Children, Medical Team Patient Interpreter Needed:  None Criminal Activity/Legal Involvement Pertinent to Current Situation/Hospitalization:  No - Comment as needed Significant Relationships:  Adult Children, Community Support, Church Lives with:  Self Do you feel safe going back to the place where you live?  Yes Need for family participation in patient care:  No (Coment)  Care giving concerns:  PT recommendation for STR secondary to hip fracture   Social Worker assessment / plan:  CSW met with the patient and a family friend (Maureen Morales) at bedside. The patient was sleeping soundly, and Maureen Morales asked that the CSW contact the patient's daughter, Maureen Morales. Maureen Morales gave verbal permission to conduct a STR referral, and she indicated [facility] and [facility] as preferences. The CSW alerted Maureen Morales to the bed availability at [facility], and she selected that with the hope that one would open at [facility]. Maureen Morales is aware that the patient will most likely discharge tomorrow, 10/22, and she requested EMS for transport.  CSW has updated Maureen Morales at [facility] that the patient has accepted the bed offer and will most likely discharge tomorrow. CSW will continue to follow for discharge facilitation.  Employment status:  Retired Insurance information:  Medicare PT Recommendations:  Skilled Nursing Facility Information / Referral to community resources:  Skilled  Nursing Facility  Patient/Family's Response to care:  The family thanked the CSW.  Patient/Family's Understanding of and Emotional Response to Diagnosis, Current Treatment, and Prognosis:  The patient and family are in agreement with PT's recommendation for STR.  Emotional Assessment Appearance:  Appears stated age Attitude/Demeanor/Rapport:  Lethargic Affect (typically observed):  Other (Sleeping) Orientation:  Oriented to Self, Oriented to Place, Oriented to  Time, Oriented to Situation Alcohol / Substance use:  Never Used Psych involvement (Current and /or in the community):  No (Comment)  Discharge Needs  Concerns to be addressed:  Care Coordination, Discharge Planning Concerns Readmission within the last 30 days:  No Current discharge risk:  None Barriers to Discharge:  Continued Medical Work up    M , LCSW 02/18/2017, 2:23 PM  

## 2017-02-18 NOTE — Evaluation (Signed)
Physical Therapy Evaluation Patient Details Name: Maureen Morales MRN: [id number] DOB: 10/11/1922 Today's Date: 02/18/2017   History of Present Illness  Pt is a 83 y/o F who tripped over her dog with subsequent hip fx.  Pt is now s/p R hip hemiarthroplasty.  Pt's PMH includes MI.    Clinical Impression  Patient is s/p above surgery resulting in functional limitations due to the deficits listed below (see PT Problem List). Maureen Morales currently requires mod assist for bed mobility and for transfers.  She has one episode of significant fatigue and appearing pale after stand pivot transfer and BP reading 120/39. RN notified.  Pt resting comfortably in chair at end of session.  Given pt's current mobility status, recommending SNF at d/c.  Patient will benefit from skilled PT to increase their independence and safety with mobility to allow discharge to the venue listed below.      Follow Up Recommendations SNF    Equipment Recommendations  Other (comment) (To be determined at next venue of care)    Recommendations for Other Services       Precautions / Restrictions Precautions Precautions: Fall;Posterior Hip Precaution Booklet Issued: Yes (comment) Precaution Comments: Provided pt with handout and education provided on hip precautions.  Pt requires cues to recall all hip precautions at end of session.  Restrictions Weight Bearing Restrictions: Yes RLE Weight Bearing: Weight bearing as tolerated      Mobility  Bed Mobility Overal bed mobility: Needs Assistance Bed Mobility: Supine to Sit     Supine to sit: Mod assist;HOB elevated     General bed mobility comments: Cues for sequencing and assist to elevate trunk and use of bed pad to assist pt to scoot to EOB.   Transfers Overall transfer level: Needs assistance Equipment used: Rolling walker (2 wheeled) Transfers: Sit to/from [facility] Sit to Stand: Mod assist Stand pivot transfers: Mod assist       General  transfer comment: Cues for hand placement and proper technique.  Pt is slow to stand and requires assist to boost to standing with cues to stand upright which pt does not maintain without cues.  She requires cues for sequencing using RW to pivot to chair and assist to control descent to chair. Once pt sitting in chair noted that pt slightly pale and appears very fatigued.  Pt reclined in chair and BP taken reading 120/39.  Color improved after several minutes and pt reports feeling fine sitting upright at end of session.   Ambulation/Gait             General Gait Details: Not safe to attempt at this time  Stairs            Wheelchair Mobility    Modified Rankin (Stroke Patients Only)       Balance Overall balance assessment: Needs assistance;History of Falls Sitting-balance support: No upper extremity supported;Feet supported Sitting balance-Leahy Scale: Fair     Standing balance support: Bilateral upper extremity supported;During functional activity Standing balance-Leahy Scale: Poor Standing balance comment: Pt relies on UE support and outside physical assist for static and dynamic activities                             Pertinent Vitals/Pain Pain Assessment: Faces Faces Pain Scale: Hurts even more Pain Location: R hip Pain Descriptors / Indicators: Aching;Grimacing;Guarding Pain Intervention(s): Limited activity within patient's tolerance;Monitored during session;Repositioned    Home Living Family/patient expects  to be discharged to:: Private residence Living Arrangements: Spouse/significant other Available Help at Discharge: Family;Personal care attendant;Available 24 hours/day Type of Home: House Home Access: Ramped entrance     Home Layout: Two level;Able to live on main level with bedroom/bathroom (living space in basement) Home Equipment: Walker - 2 wheels;Walker - 4 wheels;Cane - single point;Bedside commode;Shower seat - built in;Hand held  shower head;Grab bars - toilet;Grab bars - tub/shower;Wheelchair - Dentist Comments: Her husband requires physical assist for transfers and ambulates short distances only with walker.  Pt and husband have aides who primarily assist husband.     Prior Function Level of Independence: Independent         Comments: Pt ambualtes without AD.  She has had several falls in the past 6 months.  Aide typically present for safety when pt's showers but pt "runs them off" as she does not need physical assist.  Pt still driving.  Does do some cooking and baking.  Hired help for the cleaning.      Hand Dominance        Extremity/Trunk Assessment   Upper Extremity Assessment Upper Extremity Assessment:  (BUE strength grossly 4/5)    Lower Extremity Assessment Lower Extremity Assessment: RLE deficits/detail RLE Deficits / Details: Unable to formally assess due to pain and limited ROM due to surgical procedure documented above.  Functional strength grossly at least 3/5.     Cervical / Trunk Assessment Cervical / Trunk Assessment: Kyphotic  Communication   Communication: No difficulties  Cognition Arousal/Alertness: Awake/alert Behavior During Therapy: WFL for tasks assessed/performed Overall Cognitive Status: Within Functional Limits for tasks assessed                                        General Comments General comments (skin integrity, edema, etc.): SpO2 at or above 90% on RA at rest and with bed mobility.  SpO2 at or above 92 with transfer to chair but down to 86% on RA once pt sitting in chair.  SpO2 up to 95% on 3L O2 at end of session with pt sitting in chair.     Exercises Total Joint Exercises Ankle Circles/Pumps: AROM;Both;15 reps;Supine Quad Sets: Strengthening;Both;20 reps;Supine Hip ABduction/ADduction: AAROM;Right;10 reps;Supine   Assessment/Plan    PT Assessment Patient needs continued PT services  PT Problem List Decreased  strength;Decreased range of motion;Decreased activity tolerance;Decreased balance;Decreased mobility;Decreased knowledge of use of DME;Decreased safety awareness;Decreased knowledge of precautions;Pain;Cardiopulmonary status limiting activity       PT Treatment Interventions DME instruction;Gait training;Functional mobility training;Therapeutic activities;Therapeutic exercise;Balance training;Neuromuscular re-education;Patient/family education;Modalities;Wheelchair mobility training    PT Goals (Current goals can be found in the Care Plan section)  Acute Rehab PT Goals Patient Stated Goal: rehab before home PT Goal Formulation: With patient Time For Goal Achievement: 03/04/17 Potential to Achieve Goals: Good    Frequency BID   Barriers to discharge Decreased caregiver support Personal care attendants likely cannot meet physical needs of pt at this time    Co-evaluation               AM-PAC PT "6 Clicks" Daily Activity  Outcome Measure Difficulty turning over in bed (including adjusting bedclothes, sheets and blankets)?: Unable Difficulty moving from lying on back to sitting on the side of the bed? : Unable Difficulty sitting down on and standing up from a chair with arms (e.g., wheelchair, bedside commode, etc,.)?: Unable  Help needed moving to and from a bed to chair (including a wheelchair)?: A Lot Help needed walking in hospital room?: Total Help needed climbing 3-5 steps with a railing? : Total 6 Click Score: 7    End of Session Equipment Utilized During Treatment: Gait belt;Oxygen Activity Tolerance: Patient tolerated treatment well;Patient limited by pain;Patient limited by fatigue Patient left: in chair;with call bell/phone within reach;with chair alarm set;with family/visitor present;Other (comment) (with hip abduction pillow in place) Nurse Communication: Mobility status;Weight bearing status;Precautions PT Visit Diagnosis: Pain;Unsteadiness on feet (R26.81);Muscle  weakness (generalized) (M62.81);History of falling (Z91.81);Other abnormalities of gait and mobility (R26.89) Pain - Right/Left: Right Pain - part of body: Hip    Time: 0850-0929 PT Time Calculation (min) (ACUTE ONLY): 39 min   Charges:   PT Evaluation $PT Eval Low Complexity: 1 Low PT Treatments $Therapeutic Activity: 23-37 mins   PT G Codes:   PT G-Codes **NOT FOR INPATIENT CLASS** Functional Assessment Tool Used: AM-PAC 6 Clicks Basic Mobility;Clinical judgement Functional Limitation: Mobility: Walking and moving around Mobility: Walking and Moving Around Current Status (L3903): At least 80 percent but less than 100 percent impaired, limited or restricted Mobility: Walking and Moving Around Goal Status [phone]): At least 60 percent but less than 80 percent impaired, limited or restricted    Collie Siad PT, DPT 02/18/2017, 11:59 AM

## 2017-02-18 NOTE — Progress Notes (Signed)
lovenox dose decreased to 30mg  daily due to a crcl <28ml/min  Michalene Debruler D Davison Ohms, Pharm.D, BCPS Clinical Pharmacist

## 2017-02-18 NOTE — Anesthesia Postprocedure Evaluation (Signed)
Anesthesia Post Note  Patient: Maureen Morales  Procedure(s) Performed: ARTHROPLASTY BIPOLAR HIP (HEMIARTHROPLASTY) (Right )  Patient location during evaluation: PACU Anesthesia Type: General Level of consciousness: awake and alert Pain management: pain level controlled Vital Signs Assessment: post-procedure vital signs reviewed and stable Respiratory status: spontaneous breathing, nonlabored ventilation, respiratory function stable and patient connected to nasal cannula oxygen Cardiovascular status: blood pressure returned to baseline and stable Postop Assessment: no apparent nausea or vomiting Anesthetic complications: no     Last Vitals:  Vitals:   02/18/17 1153 02/18/17 1455  BP: 106/61 (!) 106/39  Pulse: 69 74  Resp: 16 16  Temp: 36.8 C 36.8 C  SpO2: 100% 100%    Last Pain:  Vitals:   02/18/17 1455  TempSrc: Oral  PainSc:                  Kirstein Baxley S

## 2017-02-18 NOTE — Progress Notes (Signed)
[place] [facility] [place] NAME: Maureen Morales    MR#:  [id number]  DATE OF BIRTH:  1923/04/20  SUBJECTIVE:  Right hip pain improving Family in the room POD # 1  REVIEW OF SYSTEMS:   Review of Systems  Constitutional: Negative for chills, fever and weight loss.  HENT: Negative for ear discharge, ear pain and nosebleeds.   Eyes: Negative for blurred vision, pain and discharge.  Respiratory: Negative for sputum production, shortness of breath, wheezing and stridor.   Cardiovascular: Negative for chest pain, palpitations, orthopnea and PND.  Gastrointestinal: Negative for abdominal pain, diarrhea, nausea and vomiting.  Genitourinary: Negative for frequency and urgency.  Musculoskeletal: Positive for joint pain. Negative for back pain.  Neurological: Positive for weakness. Negative for sensory change, speech change and focal weakness.  Psychiatric/Behavioral: Negative for depression and hallucinations. The patient is not nervous/anxious.    Tolerating Diet:reg  Tolerating PT: pending  DRUG ALLERGIES:   Allergies  Allergen Reactions  . Aspirin Swelling  . Atropine Other (See Comments)    Reaction: unknown  . Belladonna Alkaloids Other (See Comments)    Reaction: unknown  . Codeine Other (See Comments)    Reaction: unknown  . Darvon Other (See Comments)    Reaction: unknown  . Methocarbamol Other (See Comments)    Reaction: unknown  . Penicillins Other (See Comments)    Reaction: unknown Has patient had a PCN reaction causing immediate rash, facial/tongue/throat swelling, SOB or lightheadedness with hypotension: Unknown Has patient had a PCN reaction causing severe rash involving mucus membranes or skin necrosis: Unknown Has patient had a PCN reaction that required hospitalization: Unknown Has patient had a PCN reaction occurring within the last 10 years: Unknown If all of the above answers are "NO", then may proceed with  Cephalosporin use.   . Sulfa Antibiotics Other (See Comments)    Reaction: unknown  . Iron Other (See Comments)    High dose prescription gives her diarrhea    VITALS:  Blood pressure (!) 106/39, pulse 74, temperature 98.3 F (36.8 C), temperature source Oral, resp. rate 16, height 5\' 3"  (1.6 m), weight 56.7 kg (125 lb), SpO2 95 %.  PHYSICAL EXAMINATION:   Physical Exam  GENERAL:  83 y.o.-year-old patient lying in the bed with no acute distress.  EYES: Pupils equal, round, reactive to light and accommodation. No scleral icterus. Extraocular muscles intact.  HEENT: Head atraumatic, normocephalic. Oropharynx and nasopharynx clear.  NECK:  Supple, no jugular venous distention. No thyroid enlargement, no tenderness.  LUNGS: Normal breath sounds bilaterally, no wheezing, rales, rhonchi. No use of accessory muscles of respiration.  CARDIOVASCULAR: S1, S2 normal. No murmurs, rubs, or gallops.  ABDOMEN: Soft, nontender, nondistended. Bowel sounds present. No organomegaly or mass.  EXTREMITIES: No cyanosis, clubbing or edema b/l.   Buck's traction NEUROLOGIC: Cranial nerves II through XII are intact. No focal Motor or sensory deficits b/l.   PSYCHIATRIC:  patient is alert and oriented x 3.  SKIN: No obvious rash, lesion, or ulcer.   LABORATORY PANEL:  CBC  Recent Labs Lab 02/17/17 0349  WBC 13.1*  HGB 9.6*  HCT 29.0*  PLT 273    Chemistries   Recent Labs Lab 02/16/17 2000 02/17/17 0349  NA 138 137  K 3.8 4.0  CL 105 107  CO2 22 24  GLUCOSE 197* 198*  BUN 48* 45*  CREATININE 1.21* 1.16*  CALCIUM 9.4 9.0  AST 24  --   ALT 21  --  ALKPHOS 66  --   BILITOT 0.5  --    Cardiac Enzymes No results for input(s): TROPONINI in the last 168 hours. RADIOLOGY:  Dg Pelvis Portable  Result Date: 02/17/2017 CLINICAL DATA:  Right hip replacement EXAM: PORTABLE PELVIS 1-2 VIEWS COMPARISON:  None. FINDINGS: Right hip arthroplasty without failure or complication. No hardware  failure or complication. Postsurgical changes in the surrounding soft tissues. No fracture or dislocation.  Generalized osteopenia. IMPRESSION: Interval right hip arthroplasty without failure or complication. Electronically Signed   By: Kathreen Devoid   On: 02/17/2017 13:47   Dg Chest Port 1 View  Result Date: 02/16/2017 CLINICAL DATA:  Hip fracture preop EXAM: PORTABLE CHEST 1 VIEW COMPARISON:  11/02/2016 FINDINGS: Heart size upper normal. Vascularity normal. Prominent lung markings appear chronic. No focal infiltrate or effusion. IMPRESSION: No active disease. Electronically Signed   By: Franchot Gallo M.D.   On: 02/16/2017 20:53   Dg Hip Unilat  With Pelvis 2-3 Views Right  Result Date: 02/16/2017 CLINICAL DATA:  Right hip pain.  Fall EXAM: DG HIP (WITH OR WITHOUT PELVIS) 2-3V RIGHT COMPARISON:  None. FINDINGS: Right femoral neck fracture with angulation and displacement. Hip joint is normal. No other pelvic fracture IMPRESSION: Displaced fracture right femoral neck. Electronically Signed   By: Franchot Gallo M.D.   On: 02/16/2017 20:52   ASSESSMENT AND PLAN:  Maureen Morales  is a 83 y.o. female who presents with fall at home when she tripped over her dog and subsequent right hip fracture.  *Closed displaced fracture of right femoral neck (HCC) - hip fracture after mechanical fall.  -Orthopedic surgery plans for operative repair.  -Cardiac risk stratification as below, patient has 2 risk factors with a history of prior MI and diabetes treated with insulin, though per report cardiac catheterization after her MI did not show any coronary artery disease -pt is at low to intermediate risk for CAD -POD #1 -no new issues  *Essential hypertension - continue home meds  * Type II diabetes mellitus with nephropathy (HCC) - sliding scale insulin with corresponding glucose checks   * Hyperlipidemia - home meds   * Generalized anxiety disorder - home dose anxiolytics  Case discussed with Care  Management/Social Worker. Management plans discussed with the patient, family and they are in agreement.  CODE STATUS: DNR  DVT Prophylaxis: lovenox TOTAL TIME TAKING CARE OF THIS PATIENT: *30* minutes.  >50% time spent on counselling and coordination of care  POSSIBLE D/C IN *1-2 DAYS, DEPENDING ON CLINICAL CONDITION.  Note: This dictation was prepared with Dragon dictation along with smaller phrase technology. Any transcriptional errors that result from this process are unintentional.  Lasheba Stevens M.D on 02/18/2017 at 4:35 PM  Between 7am to 6pm - Pager - [phone]  After 6pm go to [email or web address] - password [email or web address] [facility]  Office  [phone]  CC: Primary care physician; [name], MDPatient ID: [name], female   DOB: 1922-10-26, 83 y.o.   MRN: [id number]

## 2017-02-18 NOTE — Progress Notes (Signed)
POD#1 s/p right hip hemiarthroplasty  Resting comfortably in bed. Denies any pain. Per her daughter, she is only taking tylenol for pain. She is tolerating regular diet.  PE: Hip dressing C/D/I. NVID to BLE  Plan: -PT: WBAT to RLE, posterior hip precautions -Resume anticoagulation - D/C planning

## 2017-02-18 NOTE — Progress Notes (Signed)
Pt alert and oriented. Assisted from chair back to bed, resting comfortably. IV saline locked. Weaned off of oxygen. Sats 95% on room air. Tylenol given this AM for pain. Morning BP meds held per MD Posey Pronto. No other complaints at this time. Will continue to monitor.

## 2017-02-19 ENCOUNTER — Encounter
Admission: RE | Admit: 2017-02-19 | Discharge: 2017-02-19 | Disposition: A | Discharge status: Home / Self Care | Payer: Medicare Other | Source: Ambulatory Visit | Attending: Internal Medicine | Admitting: Internal Medicine

## 2017-02-19 ENCOUNTER — Encounter: Payer: Self-pay | Admitting: Orthopedic Surgery

## 2017-02-19 ENCOUNTER — Telehealth: Payer: Self-pay | Admitting: Internal Medicine

## 2017-02-19 ENCOUNTER — Other Ambulatory Visit: Payer: Self-pay | Admitting: Internal Medicine

## 2017-02-19 DIAGNOSIS — Z9181 History of falling: Secondary | ICD-10-CM | POA: Diagnosis not present

## 2017-02-19 DIAGNOSIS — Z7984 Long term (current) use of oral hypoglycemic drugs: Secondary | ICD-10-CM | POA: Diagnosis not present

## 2017-02-19 DIAGNOSIS — Z08 Encounter for follow-up examination after completed treatment for malignant neoplasm: Secondary | ICD-10-CM | POA: Diagnosis not present

## 2017-02-19 DIAGNOSIS — S72001D Fracture of unspecified part of neck of right femur, subsequent encounter for closed fracture with routine healing: Secondary | ICD-10-CM | POA: Diagnosis not present

## 2017-02-19 DIAGNOSIS — E119 Type 2 diabetes mellitus without complications: Secondary | ICD-10-CM | POA: Diagnosis not present

## 2017-02-19 DIAGNOSIS — S72001S Fracture of unspecified part of neck of right femur, sequela: Secondary | ICD-10-CM

## 2017-02-19 DIAGNOSIS — Z96641 Presence of right artificial hip joint: Secondary | ICD-10-CM | POA: Diagnosis not present

## 2017-02-19 DIAGNOSIS — E1121 Type 2 diabetes mellitus with diabetic nephropathy: Secondary | ICD-10-CM | POA: Diagnosis not present

## 2017-02-19 DIAGNOSIS — E785 Hyperlipidemia, unspecified: Secondary | ICD-10-CM | POA: Diagnosis not present

## 2017-02-19 DIAGNOSIS — M8000XD Age-related osteoporosis with current pathological fracture, unspecified site, subsequent encounter for fracture with routine healing: Secondary | ICD-10-CM | POA: Diagnosis not present

## 2017-02-19 DIAGNOSIS — W010XXA Fall on same level from slipping, tripping and stumbling without subsequent striking against object, initial encounter: Secondary | ICD-10-CM | POA: Diagnosis not present

## 2017-02-19 DIAGNOSIS — R109 Unspecified abdominal pain: Secondary | ICD-10-CM | POA: Diagnosis not present

## 2017-02-19 DIAGNOSIS — M6281 Muscle weakness (generalized): Secondary | ICD-10-CM | POA: Diagnosis not present

## 2017-02-19 DIAGNOSIS — Z7401 Bed confinement status: Secondary | ICD-10-CM | POA: Diagnosis not present

## 2017-02-19 DIAGNOSIS — M542 Cervicalgia: Secondary | ICD-10-CM | POA: Diagnosis not present

## 2017-02-19 DIAGNOSIS — F411 Generalized anxiety disorder: Secondary | ICD-10-CM | POA: Diagnosis not present

## 2017-02-19 DIAGNOSIS — S72001A Fracture of unspecified part of neck of right femur, initial encounter for closed fracture: Secondary | ICD-10-CM | POA: Diagnosis not present

## 2017-02-19 DIAGNOSIS — Z96649 Presence of unspecified artificial hip joint: Secondary | ICD-10-CM | POA: Diagnosis not present

## 2017-02-19 DIAGNOSIS — I10 Essential (primary) hypertension: Secondary | ICD-10-CM | POA: Diagnosis not present

## 2017-02-19 DIAGNOSIS — D649 Anemia, unspecified: Secondary | ICD-10-CM | POA: Diagnosis not present

## 2017-02-19 DIAGNOSIS — K219 Gastro-esophageal reflux disease without esophagitis: Secondary | ICD-10-CM | POA: Diagnosis not present

## 2017-02-19 DIAGNOSIS — X58XXXD Exposure to other specified factors, subsequent encounter: Secondary | ICD-10-CM | POA: Diagnosis not present

## 2017-02-19 DIAGNOSIS — R262 Difficulty in walking, not elsewhere classified: Secondary | ICD-10-CM | POA: Diagnosis not present

## 2017-02-19 DIAGNOSIS — I252 Old myocardial infarction: Secondary | ICD-10-CM | POA: Diagnosis not present

## 2017-02-19 DIAGNOSIS — Y92009 Unspecified place in unspecified non-institutional (private) residence as the place of occurrence of the external cause: Secondary | ICD-10-CM | POA: Diagnosis not present

## 2017-02-19 DIAGNOSIS — D62 Acute posthemorrhagic anemia: Secondary | ICD-10-CM | POA: Diagnosis not present

## 2017-02-19 DIAGNOSIS — I2510 Atherosclerotic heart disease of native coronary artery without angina pectoris: Secondary | ICD-10-CM | POA: Diagnosis not present

## 2017-02-19 DIAGNOSIS — K921 Melena: Secondary | ICD-10-CM | POA: Diagnosis not present

## 2017-02-19 DIAGNOSIS — M25551 Pain in right hip: Secondary | ICD-10-CM | POA: Diagnosis not present

## 2017-02-19 DIAGNOSIS — Z23 Encounter for immunization: Secondary | ICD-10-CM | POA: Diagnosis not present

## 2017-02-19 DIAGNOSIS — Z79899 Other long term (current) drug therapy: Secondary | ICD-10-CM | POA: Diagnosis not present

## 2017-02-19 LAB — GLUCOSE, CAPILLARY
GLUCOSE-CAPILLARY: 145 mg/dL — AB (ref 65–99)
Glucose-Capillary: 134 mg/dL — ABNORMAL HIGH (ref 65–99)
Glucose-Capillary: 254 mg/dL — ABNORMAL HIGH (ref 65–99)

## 2017-02-19 MED ORDER — HYDROCODONE-ACETAMINOPHEN 5-325 MG PO TABS: 1.0000 | ORAL_TABLET | Freq: Four times a day (QID) | ORAL | 0 refills | Status: DC | PRN

## 2017-02-19 MED ORDER — ENOXAPARIN SODIUM 30 MG/0.3ML ~~LOC~~ SOLN: 30.0000 mg | SUBCUTANEOUS | 0 refills | Status: DC

## 2017-02-19 MED ORDER — BISACODYL 10 MG RE SUPP: 10.0000 mg | Freq: Every day | RECTAL | Status: DC | PRN

## 2017-02-19 MED ORDER — NITROGLYCERIN 0.4 MG SL SUBL: 0.4000 mg | SUBLINGUAL_TABLET | SUBLINGUAL | Status: DC | PRN

## 2017-02-19 MED ORDER — MAGNESIUM HYDROXIDE 400 MG/5ML PO SUSP
15.0000 mL | Freq: Every day | ORAL | Status: DC | PRN
  Administered 2017-02-19: 15 mL via ORAL
  Filled 2017-02-19: qty 30

## 2017-02-19 MED ORDER — FENOFIBRATE 160 MG PO TABS: 160.0000 mg | ORAL_TABLET | Freq: Every day | ORAL | Status: DC

## 2017-02-19 MED ORDER — CALCIUM CARBONATE ANTACID 500 MG PO CHEW: 1.0000 | CHEWABLE_TABLET | ORAL | Status: DC | PRN

## 2017-02-19 MED ORDER — FLEET ENEMA 7-19 GM/118ML RE ENEM: 1.0000 | ENEMA | Freq: Every day | RECTAL | Status: DC | PRN

## 2017-02-19 MED ORDER — METFORMIN HCL ER 750 MG PO TB24
750.0000 mg | ORAL_TABLET | Freq: Every day | ORAL | Status: DC
  Administered 2017-02-19: 750 mg via ORAL
  Filled 2017-02-19: qty 1

## 2017-02-19 NOTE — Discharge Summary (Signed)
[place] [facility] [place] NAME: Maureen Morales    MR#:  [id number]  DATE OF BIRTH:  07-02-1922  DATE OF ADMISSION:  02/16/2017 ADMITTING PHYSICIAN: Lance Coon, MD  DATE OF DISCHARGE:02/19/2017  PRIMARY CARE PHYSICIAN: [name], MD    ADMISSION DIAGNOSIS:  Closed fracture of neck of right femur, initial encounter ([place]) [S72.001A]  DISCHARGE DIAGNOSIS:  Closed displaced fracture of right femoral neck s/p surgery pod #2  SECONDARY DIAGNOSIS:   Past Medical History:  Diagnosis Date  . CAD (coronary artery disease)   . Diabetes mellitus   . Hyperlipidemia   . Hypertension   . Myocardial infarction ([place]) 2018  . Viral gastroenteritis due to [place]-like agent Nov 2012    HOSPITAL COURSE:   Maureen Morales a 83 y.o.femalewho presents with fall at home when she tripped over her dog and subsequent right hip fracture.  *Closed displaced fracture of right femoral neck (HCC) - hip fracture after mechanical fall.  -Orthopedic surgery plans for operative repair.  -pt is at low to intermediate risk for surgery -POD # 2 -no new issues -needs to have bm today  *Essential hypertension - continue home meds  *Type II diabetes mellitus with nephropathy (HCC) - sliding scale insulin with corresponding glucose checks -resume metformin  *Hyperlipidemia - home meds  *Generalized anxiety disorder - home dose anxiolytics  D/c to rehab today D/w pt and family D/w dr Rudene Christians CONSULTS OBTAINED:  Treatment Team:  Hessie Knows, MD  DRUG ALLERGIES:   Allergies  Allergen Reactions  . Aspirin Swelling  . Atropine Other (See Comments)    Reaction: unknown  . Belladonna Alkaloids Other (See Comments)    Reaction: unknown  . Codeine Other (See Comments)    Reaction: unknown  . Darvon Other (See Comments)    Reaction: unknown  . Methocarbamol Other (See Comments)    Reaction: unknown  . Penicillins Other (See Comments)     Reaction: unknown Has patient had a PCN reaction causing immediate rash, facial/tongue/throat swelling, SOB or lightheadedness with hypotension: Unknown Has patient had a PCN reaction causing severe rash involving mucus membranes or skin necrosis: Unknown Has patient had a PCN reaction that required hospitalization: Unknown Has patient had a PCN reaction occurring within the last 10 years: Unknown If all of the above answers are "NO", then may proceed with Cephalosporin use.   . Sulfa Antibiotics Other (See Comments)    Reaction: unknown  . Iron Other (See Comments)    High dose prescription gives her diarrhea    DISCHARGE MEDICATIONS:   Current Discharge Medication List    START taking these medications   Details  enoxaparin (LOVENOX) 30 MG/0.3ML injection Inject 0.3 mLs (30 mg total) into the skin daily. Qty: 14 Syringe, Refills: 0    HYDROcodone-acetaminophen (NORCO/VICODIN) 5-325 MG tablet Take 1-2 tablets by mouth every 6 (six) hours as needed for moderate pain. Qty: 15 tablet, Refills: 0      CONTINUE these medications which have NOT CHANGED   Details  amLODipine (NORVASC) 2.5 MG tablet Take 1 tablet (2.5 mg total) by mouth daily. Qty: 90 tablet, Refills: 3    atorvastatin (LIPITOR) 40 MG tablet TAKE 1 TABLET BY MOUTH DAILY AT 6PM Qty: 30 tablet, Refills: 3    BD MICROTAINER LANCETS MISC Contact  Activated lancet 1.8 mm x 21 g Use once daily to check blood sugars Qty: 100 each, Refills: 1    calcium carbonate (TUMS) 500 MG chewable tablet  Chew 1 tablet by mouth as needed for heartburn.     clopidogrel (PLAVIX) 75 MG tablet Take 1 tablet (75 mg total) by mouth daily with breakfast. Qty: 30 tablet, Refills: 0    fenofibrate 160 MG tablet TAKE 1 TABLET BY MOUTH DAILY FOR HIGH TRIGLYCERIDES Qty: 30 tablet, Refills: 3    glucose blood (BAYER CONTOUR TEST) test strip Use as instructed Qty: 100 each, Refills: 5    loratadine (CLARITIN) 10 MG tablet Take 10 mg by mouth  daily.    losartan-hydrochlorothiazide (HYZAAR) 100-12.5 MG tablet Take 1 tablet by mouth daily. Qty: 90 tablet, Refills: 3    metFORMIN (GLUCOPHAGE-XR) 750 MG 24 hr tablet TAKE 1 TABLET BY MOUTH DAILY WITH BREAKFAST Qty: 30 tablet, Refills: 3    metoprolol succinate (TOPROL-XL) 50 MG 24 hr tablet TAKE 1 TABLET BY MOUTH DAILY Qty: 30 tablet, Refills: 3    mupirocin ointment (BACTROBAN) 2 % Apply 1 application topically 2 (two) times daily. To left earlobe Qty: 22 g, Refills: 0    nitroGLYCERIN (NITROSTAT) 0.4 MG SL tablet Place 1 tablet (0.4 mg total) under the tongue every 5 (five) minutes as needed for chest pain. Qty: 10 tablet, Refills: 0    ondansetron (ZOFRAN ODT) 4 MG disintegrating tablet Take 1 tablet (4 mg total) by mouth every 8 (eight) hours as needed for nausea or vomiting. Qty: 20 tablet, Refills: 0    pantoprazole (PROTONIX) 40 MG tablet Take 40 mg by mouth daily.    sertraline (ZOLOFT) 50 MG tablet Take 1 tablet (50 mg total) by mouth daily. AFTER DINNER Qty: 90 tablet, Refills: 0        If you experience worsening of your admission symptoms, develop shortness of breath, life threatening emergency, suicidal or homicidal thoughts you must seek medical attention immediately by calling 911 or calling your MD immediately  if symptoms less severe.  You Must read complete instructions/literature along with all the possible adverse reactions/side effects for all the Medicines you take and that have been prescribed to you. Take any new Medicines after you have completely understood and accept all the possible adverse reactions/side effects.   Please note  You were cared for by a hospitalist during your hospital stay. If you have any questions about your discharge medications or the care you received while you were in the hospital after you are discharged, you can call the unit and asked to speak with the hospitalist on call if the hospitalist that took care of you is not  available. Once you are discharged, your primary care physician will handle any further medical issues. Please note that NO REFILLS for any discharge medications will be authorized once you are discharged, as it is imperative that you return to your primary care physician (or establish a relationship with a primary care physician if you do not have one) for your aftercare needs so that they can reassess your need for medications and monitor your lab values. Today   SUBJECTIVE   Doing well  VITAL SIGNS:  Blood pressure (!) 122/45, pulse 83, temperature 98.5 F (36.9 C), temperature source Oral, resp. rate 16, height 5\' 3"  (1.6 m), weight 56.7 kg (125 lb), SpO2 97 %.  I/O:   Intake/Output Summary (Last 24 hours) at 02/19/17 0803 Last data filed at 02/19/17 0711  Gross per 24 hour  Intake                0 ml  Output  1225 ml  Net            -1225 ml    PHYSICAL EXAMINATION:  GENERAL:  83 y.o.-year-old patient lying in the bed with no acute distress.  EYES: Pupils equal, round, reactive to light and accommodation. No scleral icterus. Extraocular muscles intact.  HEENT: Head atraumatic, normocephalic. Oropharynx and nasopharynx clear.  NECK:  Supple, no jugular venous distention. No thyroid enlargement, no tenderness.  LUNGS: Normal breath sounds bilaterally, no wheezing, rales,rhonchi or crepitation. No use of accessory muscles of respiration.  CARDIOVASCULAR: S1, S2 normal. No murmurs, rubs, or gallops.  ABDOMEN: Soft, non-tender, non-distended. Bowel sounds present. No organomegaly or mass.  EXTREMITIES: No pedal edema, cyanosis, or clubbing.  NEUROLOGIC: Cranial nerves II through XII are intact. Muscle strength 5/5 in all extremities. Sensation intact. Gait not checked.  PSYCHIATRIC: The patient is alert and oriented x 3.  SKIN: No obvious rash, lesion, or ulcer.   DATA REVIEW:   CBC   Recent Labs Lab 02/17/17 0349  WBC 13.1*  HGB 9.6*  HCT 29.0*  PLT 273     Chemistries   Recent Labs Lab 02/16/17 2000 02/17/17 0349  NA 138 137  K 3.8 4.0  CL 105 107  CO2 22 24  GLUCOSE 197* 198*  BUN 48* 45*  CREATININE 1.21* 1.16*  CALCIUM 9.4 9.0  AST 24  --   ALT 21  --   ALKPHOS 66  --   BILITOT 0.5  --     Microbiology Results   Recent Results (from the past 240 hour(s))  Surgical PCR screen     Status: None   Collection Time: 02/17/17 12:20 AM  Result Value Ref Range Status   MRSA, PCR NEGATIVE NEGATIVE Final   Staphylococcus aureus NEGATIVE NEGATIVE Final    Comment: (NOTE) The Xpert SA Assay (FDA approved for NASAL specimens in patients 40 years of age and older), is one component of a comprehensive surveillance program. It is not intended to diagnose infection nor to guide or monitor treatment.     RADIOLOGY:  Dg Pelvis Portable  Result Date: 02/17/2017 CLINICAL DATA:  Right hip replacement EXAM: PORTABLE PELVIS 1-2 VIEWS COMPARISON:  None. FINDINGS: Right hip arthroplasty without failure or complication. No hardware failure or complication. Postsurgical changes in the surrounding soft tissues. No fracture or dislocation.  Generalized osteopenia. IMPRESSION: Interval right hip arthroplasty without failure or complication. Electronically Signed   By: Kathreen Devoid   On: 02/17/2017 13:47     Management plans discussed with the patient, family and they are in agreement.  CODE STATUS:     Code Status Orders        Start     Ordered   02/16/17 2328  Do not attempt resuscitation (DNR)  Continuous    Question Answer Comment  In the event of cardiac or respiratory ARREST Do not call a "code blue"   In the event of cardiac or respiratory ARREST Do not perform Intubation, CPR, defibrillation or ACLS   In the event of cardiac or respiratory ARREST Use medication by any route, position, wound care, and other measures to relive pain and suffering. May use oxygen, suction and manual treatment of airway obstruction as needed for  comfort.      02/16/17 2327    Code Status History    Date Active Date Inactive Code Status Order ID Comments User Context   11/01/2016  1:39 PM 12/31/2016  7:26 AM DNR [id number]  [name],  MD Outpatient   11/01/2016 11:39 AM 11/01/2016  1:39 PM DNR [id number]  Hillary Bow, MD ED   08/30/2016 11:48 PM 09/02/2016  4:55 PM Full Code [id number]  Lance Coon, MD Inpatient    Advance Directive Documentation     Most Recent Value  Type of Advance Directive  Healthcare Power of [place], Living will  Pre-existing out of facility DNR order (yellow form or pink MOST form)  -  "MOST" Form in Place?  -      TOTAL TIME TAKING CARE OF THIS PATIENT: 40 minutes.    Julyan Gales M.D on 02/19/2017 at 8:03 AM  Between 7am to 6pm - Pager - [phone] After 6pm go to [email or web address] - password [email or web address] [facility]  Office  [phone]  CC: Primary care physician; [name], MD

## 2017-02-19 NOTE — Progress Notes (Signed)
Patient is medically stable for D/C to [facility] today. Per [facility] admissions coordinator at [facility] patient can come to room 342. RN will call report at [phone] and arrange EMS for transport. Clinical Education officer, museum (CSW) sent D/C orders to [employer] via [place]. Patient is aware of above. Patient's daughter [name] is at bedside and aware of above. Please reconsult if future social work needs arise. CSW signing off.   [employer], LCSW [phone]

## 2017-02-19 NOTE — Progress Notes (Signed)
Called report to [place] [place], Therapist, sports at [place]. EMS called for transport

## 2017-02-19 NOTE — Telephone Encounter (Signed)
Shiela called from [facility] pt is being discharged today. Dx was Right hip fracture. Pt is scheduled for 03/01/2017. Thank you!

## 2017-02-19 NOTE — Progress Notes (Signed)
Physical Therapy Treatment Patient Details Name: Maureen Morales MRN: [id number] DOB: December 05, 1922 Today's Date: 02/19/2017    History of Present Illness Pt is a 83 y/o F who tripped over her dog with subsequent hip fx.  Pt is now s/p R hip hemiarthroplasty.  Pt's PMH includes MI.    PT Comments    Pt presents with deficits in strength, transfers, mobility, gait, balance, and activity tolerance but is making progress towards goals.  Pt required min A with bed mobility and transfers along with verbal cues to ensure hip precaution compliance.  Pt able to amb 5' with RW and close CGA with verbal and visual cues to prevent CKC R hip IR during turn to the R.  SpO2 decreased from baseline of 95% to 94% after amb with HR remaining in the 80s throughout.  Pt c/o mild dizziness after amb with BP taken in sitting at 104/34 mmHg compared to baseline of 123/47 mmHg in sitting, nursing notified.  Pt will benefit from PT services in a SNF setting upon discharge to safely address above deficits for decreased caregiver assistance and eventual return to PLOF.     Follow Up Recommendations  SNF     Equipment Recommendations  Other (comment) (TBD at the next venue of care)    Recommendations for Other Services       Precautions / Restrictions Precautions Precautions: Fall;Posterior Hip Precaution Booklet Issued: Yes (comment) Restrictions Weight Bearing Restrictions: Yes RLE Weight Bearing: Weight bearing as tolerated    Mobility  Bed Mobility Overal bed mobility: Needs Assistance Bed Mobility: Supine to Sit     Supine to sit: HOB elevated;Min assist     General bed mobility comments: Min-mod verbal cues for sequencing  Transfers Overall transfer level: Needs assistance Equipment used: Rolling walker (2 wheeled) Transfers: Sit to/from Stand   Stand pivot transfers: Min assist       General transfer comment: Mod verbal cues for proper sequencing to ensure compliance with post hip  precautions during sit to/from stand transfers  Ambulation/Gait Ambulation/Gait assistance: Min guard Ambulation Distance (Feet): 5 Feet Assistive device: Rolling walker (2 wheeled) Gait Pattern/deviations: Step-to pattern;Trunk flexed   Gait velocity interpretation: Below normal speed for age/gender General Gait Details: Mod verbal cues for sequencing with gait.  Pt and family education on proper technique during R turns to prevent CKC R hip IR   Stairs            Wheelchair Mobility    Modified Rankin (Stroke Patients Only)       Balance Overall balance assessment: Needs assistance Sitting-balance support: No upper extremity supported;Feet supported;Feet unsupported Sitting balance-Leahy Scale: Good     Standing balance support: Bilateral upper extremity supported Standing balance-Leahy Scale: Poor Standing balance comment: Some posterior instability upon initial stand with RW with pt leaning on EOB for support but improved during amb to CGA                            Cognition Arousal/Alertness: Awake/alert Behavior During Therapy: WFL for tasks assessed/performed Overall Cognitive Status: Within Functional Limits for tasks assessed                                        Exercises Total Joint Exercises Ankle Circles/Pumps: AROM;Both;5 reps;10 reps Quad Sets: Strengthening;Both;10 reps Gluteal Sets: Strengthening;Both;10 reps Hip ABduction/ADduction: AAROM;Right;5 reps  Long Arc Quad: AROM;Both;5 reps;10 reps Knee Flexion: AROM;Both;5 reps;10 reps Other Exercises Other Exercises: Posterior hip precaution education to pt and family verbally and with demonstration.    General Comments General comments (skin integrity, edema, etc.): SpO2 decreased from baseline of 95% to 94% after amb with HR remaining in the 80s throughout.  Pt c/o mild dizziness after amb with BP taken in sitting at 104/34 mmHg compared to baseline of 123/47 mmHg in  sitting.        Pertinent Vitals/Pain Pain Assessment: No/denies pain Pain Location: No pain at rest Pain Intervention(s): Monitored during session;Limited activity within patient's tolerance    Home Living                      Prior Function            PT Goals (current goals can now be found in the care plan section) Progress towards PT goals: Progressing toward goals    Frequency    BID      PT Plan Current plan remains appropriate    Co-evaluation              AM-PAC PT "6 Clicks" Daily Activity  Outcome Measure                   End of Session Equipment Utilized During Treatment: Gait belt Activity Tolerance: Patient tolerated treatment well;No increased pain Patient left: in chair;with chair alarm set;with call bell/phone within reach;with family/visitor present (Abd pillow in place) Nurse Communication: Mobility status PT Visit Diagnosis: Pain;Unsteadiness on feet (R26.81);Muscle weakness (generalized) (M62.81);History of falling (Z91.81);Other abnormalities of gait and mobility (R26.89)     Time: 0930-1009 PT Time Calculation (min) (ACUTE ONLY): 39 min  Charges:  $Gait Training: 8-22 mins $Therapeutic Exercise: 8-22 mins $Therapeutic Activity: 8-22 mins                    G Codes:       D. Royetta Asal PT, DPT 02/19/17, 11:30 AM

## 2017-02-19 NOTE — Clinical Social Work Placement (Signed)
   CLINICAL SOCIAL WORK PLACEMENT  NOTE  Date:  02/19/2017  Patient Details  Name: Maureen Morales MRN: [id number] Date of Birth: Mar 06, 1923  Clinical Social Work is seeking post-discharge placement for this patient at the [place] level of care (*CSW will initial, date and re-position this form in  chart as items are completed):  Yes   Patient/family provided with [facility] Work Department's list of facilities offering this level of care within the geographic area requested by the patient (or if unable, by the patient's family).  Yes   Patient/family informed of their freedom to choose among providers that offer the needed level of care, that participate in Medicare, Medicaid or managed care program needed by the patient, have an available bed and are willing to accept the patient.  Yes   Patient/family informed of [facility]'s ownership interest in [facility] and [facility], as well as of the fact that they are under no obligation to receive care at these facilities.  PASRR submitted to EDS on 02/17/17     PASRR number received on 02/17/17     Existing PASRR number confirmed on       FL2 transmitted to all facilities in geographic area requested by pt/family on 02/18/17     FL2 transmitted to all facilities within larger geographic area on       Patient informed that his/her managed care company has contracts with or will negotiate with certain facilities, including the following:        Yes   Patient/family informed of bed offers received.  Patient chooses bed at [facility]     Physician recommends and patient chooses bed at [facility]    Patient to be transferred to [facility] on 02/19/17.  Patient to be transferred to facility by EMS     Patient family notified on 02/19/17 of transfer.  Name of family member notified:   (Patient's daughter [name] is at bedside and aware of D/C today. )     PHYSICIAN        Additional Comment:    _______________________________________________ Kerin Kren, Veronia Beets, LCSW 02/19/2017, 1:26 PM

## 2017-02-19 NOTE — Care Management Important Message (Signed)
Important Message  Patient Details  Name: Maureen Morales MRN: [id number] Date of Birth: 05-16-1922   Medicare Important Message Given:  Yes    Jolly Mango, RN 02/19/2017, 9:00 AM

## 2017-02-19 NOTE — Telephone Encounter (Signed)
Her paperwork states D/C to skilled nursing. Still inpatient as of current time, will need to follow up tomorrow. thanks

## 2017-02-20 ENCOUNTER — Other Ambulatory Visit: Payer: Self-pay

## 2017-02-20 MED ORDER — HYDROCODONE-ACETAMINOPHEN 5-325 MG PO TABS: 1.0000 | ORAL_TABLET | Freq: Four times a day (QID) | ORAL | 0 refills | Status: DC | PRN

## 2017-02-20 NOTE — Telephone Encounter (Signed)
Rx sent to [facility] phone : [phone] , fax : [phone]  

## 2017-02-21 ENCOUNTER — Non-Acute Institutional Stay (SKILLED_NURSING_FACILITY): Payer: Medicare Other | Admitting: Gerontology

## 2017-02-21 ENCOUNTER — Other Ambulatory Visit
Admission: RE | Admit: 2017-02-21 | Discharge: 2017-02-21 | Disposition: A | Discharge status: Home / Self Care | Payer: Medicare Other | Source: Ambulatory Visit | Attending: Gerontology | Admitting: Gerontology

## 2017-02-21 DIAGNOSIS — S72001A Fracture of unspecified part of neck of right femur, initial encounter for closed fracture: Secondary | ICD-10-CM

## 2017-02-21 DIAGNOSIS — Z96649 Presence of unspecified artificial hip joint: Secondary | ICD-10-CM | POA: Diagnosis not present

## 2017-02-21 DIAGNOSIS — D62 Acute posthemorrhagic anemia: Secondary | ICD-10-CM | POA: Diagnosis not present

## 2017-02-21 DIAGNOSIS — I2510 Atherosclerotic heart disease of native coronary artery without angina pectoris: Secondary | ICD-10-CM | POA: Diagnosis not present

## 2017-02-21 DIAGNOSIS — M8000XD Age-related osteoporosis with current pathological fracture, unspecified site, subsequent encounter for fracture with routine healing: Secondary | ICD-10-CM | POA: Diagnosis not present

## 2017-02-21 DIAGNOSIS — S72001D Fracture of unspecified part of neck of right femur, subsequent encounter for closed fracture with routine healing: Secondary | ICD-10-CM | POA: Insufficient documentation

## 2017-02-21 DIAGNOSIS — X58XXXD Exposure to other specified factors, subsequent encounter: Secondary | ICD-10-CM | POA: Insufficient documentation

## 2017-02-21 DIAGNOSIS — E119 Type 2 diabetes mellitus without complications: Secondary | ICD-10-CM | POA: Diagnosis not present

## 2017-02-21 LAB — CBC WITH DIFFERENTIAL/PLATELET
BASOS ABS: 0 10*3/uL (ref 0–0.1)
Basophils Relative: 0 %
EOS ABS: 0.1 10*3/uL (ref 0–0.7)
EOS PCT: 1 %
HCT: 19.7 % — ABNORMAL LOW (ref 35.0–47.0)
HEMOGLOBIN: 6.3 g/dL — AB (ref 12.0–16.0)
Lymphocytes Relative: 6 %
Lymphs Abs: 0.8 10*3/uL — ABNORMAL LOW (ref 1.0–3.6)
MCH: 27.1 pg (ref 26.0–34.0)
MCHC: 32 g/dL (ref 32.0–36.0)
MCV: 84.6 fL (ref 80.0–100.0)
Monocytes Absolute: 0.7 10*3/uL (ref 0.2–0.9)
Monocytes Relative: 6 %
NEUTROS PCT: 87 %
Neutro Abs: 10.8 10*3/uL — ABNORMAL HIGH (ref 1.4–6.5)
PLATELETS: 286 10*3/uL (ref 150–440)
RBC: 2.33 MIL/uL — AB (ref 3.80–5.20)
RDW: 13.8 % (ref 11.5–14.5)
WBC: 12.5 10*3/uL — AB (ref 3.6–11.0)

## 2017-02-21 LAB — SURGICAL PATHOLOGY

## 2017-02-21 NOTE — Progress Notes (Signed)
Location:      Place of Service:  SNF (31) Provider:  Toni Arthurs, NP-C  [name], MD  Patient Care Team: [name], MD as PCP - General (Internal Medicine) [name], MD (Internal Medicine) Bary Castilla Forest Gleason, MD (General Surgery)  Extended Emergency Contact Information Primary Emergency Contact: Fransico Setters [place] Phone: [phone] Work Phone: [phone] Mobile Phone: [phone] Relation: Daughter Secondary Emergency Contact: Renne Crigler [place] Mobile Phone: [phone] Relation: Son  Code Status:  Full Goals of care: Advanced Directive information Advanced Directives 02/16/2017  Does Patient Have a Medical Advance Directive? Yes  Type of Paramedic of [place];Living will  Does patient want to make changes to medical advance directive? No - Patient declined  Copy of [place] in Chart? Yes  Would patient like information on creating a medical advance directive? -     Chief Complaint  Patient presents with  . Acute Visit    HPI:  Pt is a 83 y.o. female seen today for an acute visit for evaluation of wound drainage. Staff has been having to change the dressing 2-3 times/ day for 2 days d/t bleeding from the proximal incision site. Incision is well approximated. Staples intact. No redness, warmth, bruising, odor. Today, the dressing is CDI- honeycomb dressing was applied early this morning. No drainage so far. No fluid pocket palpated subcutaneously. B-pedal pulses intact. B-calves soft, supple. Negative Homan's sign. B/P lower than normal, but not hypotensive. Pulse WNL. O2 sats WNL. Pt denies shortness of breath/dyspnea. Mucus membranes pale. Pt reports her pain is well managed with current regimen. Appetite is good, having regular BMs. Pt reports she is feeling well and without complaints except for expected, occasional hip pain. Lengthy discussion had with  daughter re: findings of the drainage, assessment findings, labs, vitals, interpretation of results and plan of care/action. Daughter verbalized understanding and is agreeable with plan.    Past Medical History:  Diagnosis Date  . CAD (coronary artery disease)   . Diabetes mellitus   . Hyperlipidemia   . Hypertension   . Myocardial infarction ([place]) 2018  . Viral gastroenteritis due to [place]-like agent Nov 2012   Past Surgical History:  Procedure Laterality Date  . ABDOMINAL HYSTERECTOMY    . BLADDER SURGERY    . CARDIAC CATHETERIZATION    . CHOLECYSTECTOMY    . HIP ARTHROPLASTY Right 02/17/2017   Procedure: ARTHROPLASTY BIPOLAR HIP (HEMIARTHROPLASTY);  Surgeon: Claud Kelp, MD;  Location: [facility] ORS;  Service: Orthopedics;  Laterality: Right;  . LEFT HEART CATH AND CORONARY ANGIOGRAPHY N/A 08/31/2016   Procedure: Left Heart Cath and Coronary Angiography;  Surgeon: Wellington Hampshire, MD;  Location: [place] [facility];  Service: Cardiovascular;  Laterality: N/A;    Allergies  Allergen Reactions  . Aspirin Swelling  . Atropine Other (See Comments)    Reaction: unknown  . Belladonna Alkaloids Other (See Comments)    Reaction: unknown  . Codeine Other (See Comments)    Reaction: unknown  . Darvon Other (See Comments)    Reaction: unknown  . Methocarbamol Other (See Comments)    Reaction: unknown  . Penicillins Other (See Comments)    Reaction: unknown Has patient had a PCN reaction causing immediate rash, facial/tongue/throat swelling, SOB or lightheadedness with hypotension: Unknown Has patient had a PCN reaction causing severe rash involving mucus membranes or skin necrosis: Unknown Has patient had a PCN reaction that required hospitalization: Unknown  Has patient had a PCN reaction occurring within the last 10 years: Unknown If all of the above answers are "NO", then may proceed with Cephalosporin use.   . Sulfa Antibiotics Other (See Comments)    Reaction: unknown  .  Iron Other (See Comments)    High dose prescription gives her diarrhea    Allergies as of 02/21/2017      Reactions   Aspirin Swelling   Atropine Other (See Comments)   Reaction: unknown   Belladonna Alkaloids Other (See Comments)   Reaction: unknown   Codeine Other (See Comments)   Reaction: unknown   Darvon Other (See Comments)   Reaction: unknown   Methocarbamol Other (See Comments)   Reaction: unknown   Penicillins Other (See Comments)   Reaction: unknown Has patient had a PCN reaction causing immediate rash, facial/tongue/throat swelling, SOB or lightheadedness with hypotension: Unknown Has patient had a PCN reaction causing severe rash involving mucus membranes or skin necrosis: Unknown Has patient had a PCN reaction that required hospitalization: Unknown Has patient had a PCN reaction occurring within the last 10 years: Unknown If all of the above answers are "NO", then may proceed with Cephalosporin use.   Sulfa Antibiotics Other (See Comments)   Reaction: unknown   Iron Other (See Comments)   High dose prescription gives her diarrhea      Medication List       Accurate as of 02/21/17  2:39 PM. Always use your most recent med list.          amLODipine 2.5 MG tablet Commonly known as:  NORVASC Take 1 tablet (2.5 mg total) by mouth daily.   atorvastatin 40 MG tablet Commonly known as:  LIPITOR TAKE 1 TABLET BY MOUTH DAILY AT 6PM   BD MICROTAINER LANCETS Misc Contact  Activated lancet 1.8 mm x 21 g Use once daily to check blood sugars   clopidogrel 75 MG tablet Commonly known as:  PLAVIX Take 1 tablet (75 mg total) by mouth daily with breakfast.   enoxaparin 30 MG/0.3ML injection Commonly known as:  LOVENOX Inject 0.3 mLs (30 mg total) into the skin daily.   fenofibrate 160 MG tablet TAKE 1 TABLET BY MOUTH DAILY FOR HIGH TRIGLYCERIDES   glucose blood test strip Commonly known as:  BAYER CONTOUR TEST Use as instructed   HYDROcodone-acetaminophen  5-325 MG tablet Commonly known as:  NORCO/VICODIN Take 1-2 tablets by mouth every 6 (six) hours as needed for moderate pain.   loratadine 10 MG tablet Commonly known as:  CLARITIN Take 10 mg by mouth daily.   losartan-hydrochlorothiazide 100-12.5 MG tablet Commonly known as:  HYZAAR Take 1 tablet by mouth daily.   metFORMIN 750 MG 24 hr tablet Commonly known as:  GLUCOPHAGE-XR TAKE 1 TABLET BY MOUTH DAILY WITH BREAKFAST   metoprolol succinate 50 MG 24 hr tablet Commonly known as:  TOPROL-XL TAKE 1 TABLET BY MOUTH DAILY   mupirocin ointment 2 % Commonly known as:  BACTROBAN Apply 1 application topically 2 (two) times daily. To left earlobe   nitroGLYCERIN 0.4 MG SL tablet Commonly known as:  NITROSTAT Place 1 tablet (0.4 mg total) under the tongue every 5 (five) minutes as needed for chest pain.   ondansetron 4 MG disintegrating tablet Commonly known as:  ZOFRAN ODT Take 1 tablet (4 mg total) by mouth every 8 (eight) hours as needed for nausea or vomiting.   pantoprazole 40 MG tablet Commonly known as:  PROTONIX Take 40 mg by mouth daily.  sertraline 50 MG tablet Commonly known as:  ZOLOFT Take 1 tablet (50 mg total) by mouth daily. AFTER DINNER   TUMS 500 MG chewable tablet Generic drug:  calcium carbonate Chew 1 tablet by mouth as needed for heartburn.       Review of Systems  Constitutional: Negative for activity change, appetite change, chills, diaphoresis and fever.  HENT: Negative for congestion, sneezing, sore throat, trouble swallowing and voice change.   Respiratory: Negative for apnea, cough, choking, chest tightness, shortness of breath and wheezing.   Cardiovascular: Negative for chest pain, palpitations and leg swelling.  Gastrointestinal: Negative for abdominal distention, abdominal pain, constipation, diarrhea and nausea.  Genitourinary: Negative for difficulty urinating, dysuria, frequency and urgency.  Musculoskeletal: Positive for arthralgias  (typical arthritis). Negative for back pain, gait problem and myalgias.  Skin: Positive for pallor and wound. Negative for color change and rash.  Neurological: Negative for dizziness, tremors, syncope, speech difficulty, weakness, numbness and headaches.  Psychiatric/Behavioral: Negative for agitation and behavioral problems.  All other systems reviewed and are negative.   Immunization History  Administered Date(s) Administered  . Influenza Split 02/01/2011  . Influenza,inj,Quad PF,6+ Mos 01/08/2013, 12/28/2014  . Influenza-Unspecified 01/30/2012, 01/29/2014, 01/25/2016  . Pneumococcal Conjugate-13 07/31/2013, 09/07/2014  . Pneumococcal Polysaccharide-23 08/01/2006, 02/18/2017  . Tdap 05/17/2008  . Zoster 11/14/2010   Pertinent  Health Maintenance Due  Topic Date Due  . DEXA SCAN  03/11/1990  . OPHTHALMOLOGY EXAM  06/30/2015  . FOOT EXAM  12/28/2015  . INFLUENZA VACCINE  11/29/2016  . HEMOGLOBIN A1C  06/06/2017  . PNA vac Low Risk Adult  Completed   Fall Risk  09/07/2016 05/22/2015 07/08/2014 01/27/2013 04/17/2012  Falls in the past year? No No Yes Yes Yes  Number falls in past yr: - - 1 1 1   Injury with Fall? - - Yes No Yes  Risk for fall due to : - - Impaired mobility Impaired balance/gait -  Follow up - - Falls prevention discussed - -   Functional Status Survey:    Vitals:   02/21/17 1100  BP: (!) 109/50  Pulse: 80  Resp: 16  Temp: 98.9 F (37.2 C)  SpO2: 98%   There is no height or weight on file to calculate BMI. Physical Exam  Constitutional: She is oriented to person, place, and time. Vital signs are normal. She appears well-developed and well-nourished. She is active and cooperative. She does not appear ill. No distress.  HENT:  Head: Normocephalic and atraumatic.  Mouth/Throat: Uvula is midline, oropharynx is clear and moist and mucous membranes are normal. Mucous membranes are not pale, not dry and not cyanotic.  Eyes: Pupils are equal, round, and reactive to  light. Conjunctivae, EOM and lids are normal.  Neck: Trachea normal, normal range of motion and full passive range of motion without pain. Neck supple. No JVD present. No tracheal deviation, no edema and no erythema present. No thyromegaly present.  Cardiovascular: Normal rate, regular rhythm, intact distal pulses and normal pulses.  Exam reveals no gallop and no distant heart sounds.   Murmur heard. Pulses:      Dorsalis pedis pulses are 2+ on the right side, and 2+ on the left side.  No edema  Pulmonary/Chest: Effort normal and breath sounds normal. No accessory muscle usage. No respiratory distress. She has no decreased breath sounds. She has no wheezes. She has no rhonchi. She has no rales. She exhibits no tenderness.  Abdominal: Soft. Normal appearance and bowel sounds are normal. She  exhibits no distension and no ascites. There is no tenderness.  Musculoskeletal: She exhibits no edema or tenderness.       Right hip: She exhibits decreased range of motion, decreased strength and laceration.  Expected osteoarthritis, stiffness, calves soft, supple. Negative Homan's sign  Neurological: She is alert and oriented to person, place, and time. She has normal strength.  Skin: Skin is warm and dry. Laceration noted. She is not diaphoretic. No cyanosis. No pallor. Nails show no clubbing.  Right hip incision CDI, well approximated  Psychiatric: She has a normal mood and affect. Her speech is normal and behavior is normal. Judgment and thought content normal. Cognition and memory are normal.  Nursing note and vitals reviewed.   Labs reviewed:  Recent Labs  11/01/16 0716  11/03/16 0338  02/02/17 0913 02/16/17 2000 02/17/17 0349  NA  --   < > 137  < > 136 138 137  K  --   < > 3.5  < > 4.3 3.8 4.0  CL  --   < > 114*  < > 104 105 107  CO2  --   < > 18*  < > 23 22 24   GLUCOSE  --   < > 107*  < > 138* 197* 198*  BUN  --   < > 19  < > 40* 48* 45*  CREATININE  --   < > 1.05*  < > 1.17* 1.21* 1.16*   CALCIUM  --   < > 8.3*  < > 9.4 9.4 9.0  MG 1.8  --  1.7  --   --   --   --   < > = values in this interval not displayed.  Recent Labs  11/01/16 0547 12/04/16 0808 02/16/17 2000  AST 35 13 24  ALT 31 14 21   ALKPHOS 56 43 66  BILITOT 0.7 0.4 0.5  PROT 7.6 6.6 6.9  ALBUMIN 4.3 4.0 4.0    Recent Labs  12/04/16 0808 02/16/17 2000 02/17/17 0349 02/20/17 1829  WBC 6.9 8.4 13.1* 12.5*  NEUTROABS 4.7 6.6*  --  10.8*  HGB 10.4* 10.1* 9.6* 6.3*  HCT 31.3* 29.7* 29.0* 19.7*  MCV 87.9 83.9 84.4 84.6  PLT 269.0 292 273 286   Lab Results  Component Value Date   TSH 3.76 08/27/2015   Lab Results  Component Value Date   HGBA1C 7.0 (H) 12/04/2016   Lab Results  Component Value Date   CHOL 132 12/04/2016   HDL 38.30 (L) 12/04/2016   LDLCALC 68 12/04/2016   LDLDIRECT 92.0 07/06/2016   TRIG 128.0 12/04/2016   CHOLHDL 3 12/04/2016    Significant Diagnostic Results in last 30 days:  Dg Pelvis Portable  Result Date: 02/17/2017 CLINICAL DATA:  Right hip replacement EXAM: PORTABLE PELVIS 1-2 VIEWS COMPARISON:  None. FINDINGS: Right hip arthroplasty without failure or complication. No hardware failure or complication. Postsurgical changes in the surrounding soft tissues. No fracture or dislocation.  Generalized osteopenia. IMPRESSION: Interval right hip arthroplasty without failure or complication. Electronically Signed   By: Kathreen Devoid   On: 02/17/2017 13:47   Mr Foot Left Wo Contrast  Result Date: 01/24/2017 CLINICAL DATA:  Pt twisted ankle falling over dog 2-3 months ago. Swelling and pain in midfoot that is warm to the touch. EXAM: MRI OF THE LEFT FOOT WITHOUT CONTRAST MRI OF THE LEFT ANKLE WITHOUT CONTRAST TECHNIQUE: Multiplanar, multisequence MR imaging of the left foot and ankle was performed. No intravenous contrast was administered. COMPARISON:  None. FINDINGS: TENDONS Peroneal: Peroneal longus tendon intact. Mild tendinosis of the peroneus brevis without a tear.  Posteromedial: Posterior tibial tendon intact. Flexor hallucis longus tendon intact. Flexor digitorum longus tendon intact. Anterior: Tibialis anterior tendon intact. Extensor hallucis longus tendon intact Extensor digitorum longus tendon intact. Achilles:  Intact. Plantar Fascia: Intact. LIGAMENTS Lateral: Anterior talofibular ligament intact. Calcaneofibular ligament intact. Posterior talofibular ligament intact. Anterior and posterior tibiofibular ligaments intact. Medial: Deltoid ligament intact. Spring ligament intact. Lisfranc: Intact. CARTILAGE Ankle Joint: No joint effusion. Normal ankle mortise. No chondral defect. Subtalar Joints/Sinus Tarsi: Normal subtalar joints. No subtalar joint effusion. Normal sinus tarsi. Bones: No marrow signal abnormality.  No fracture or dislocation. Soft Tissue: No fluid collection or hematoma. Soft tissue edema along the lateral malleolus. Mild osteoarthritis of the first MTP joint. IMPRESSION: 1. No acute injury of the left foot. 2. No acute injury of the left ankle. 3. Mild tendinosis of the peroneus brevis. Electronically Signed   By: Kathreen Devoid   On: 01/24/2017 13:35   Mr Ankle Left Wo Contrast  Result Date: 01/24/2017 CLINICAL DATA:  Pt twisted ankle falling over dog 2-3 months ago. Swelling and pain in midfoot that is warm to the touch. EXAM: MRI OF THE LEFT FOOT WITHOUT CONTRAST MRI OF THE LEFT ANKLE WITHOUT CONTRAST TECHNIQUE: Multiplanar, multisequence MR imaging of the left foot and ankle was performed. No intravenous contrast was administered. COMPARISON:  None. FINDINGS: TENDONS Peroneal: Peroneal longus tendon intact. Mild tendinosis of the peroneus brevis without a tear. Posteromedial: Posterior tibial tendon intact. Flexor hallucis longus tendon intact. Flexor digitorum longus tendon intact. Anterior: Tibialis anterior tendon intact. Extensor hallucis longus tendon intact Extensor digitorum longus tendon intact. Achilles:  Intact. Plantar Fascia: Intact.  LIGAMENTS Lateral: Anterior talofibular ligament intact. Calcaneofibular ligament intact. Posterior talofibular ligament intact. Anterior and posterior tibiofibular ligaments intact. Medial: Deltoid ligament intact. Spring ligament intact. Lisfranc: Intact. CARTILAGE Ankle Joint: No joint effusion. Normal ankle mortise. No chondral defect. Subtalar Joints/Sinus Tarsi: Normal subtalar joints. No subtalar joint effusion. Normal sinus tarsi. Bones: No marrow signal abnormality.  No fracture or dislocation. Soft Tissue: No fluid collection or hematoma. Soft tissue edema along the lateral malleolus. Mild osteoarthritis of the first MTP joint. IMPRESSION: 1. No acute injury of the left foot. 2. No acute injury of the left ankle. 3. Mild tendinosis of the peroneus brevis. Electronically Signed   By: Kathreen Devoid   On: 01/24/2017 13:35   Dg Chest Port 1 View  Result Date: 02/16/2017 CLINICAL DATA:  Hip fracture preop EXAM: PORTABLE CHEST 1 VIEW COMPARISON:  11/02/2016 FINDINGS: Heart size upper normal. Vascularity normal. Prominent lung markings appear chronic. No focal infiltrate or effusion. IMPRESSION: No active disease. Electronically Signed   By: Franchot Gallo M.D.   On: 02/16/2017 20:53   Dg Hip Unilat  With Pelvis 2-3 Views Right  Result Date: 02/16/2017 CLINICAL DATA:  Right hip pain.  Fall EXAM: DG HIP (WITH OR WITHOUT PELVIS) 2-3V RIGHT COMPARISON:  None. FINDINGS: Right femoral neck fracture with angulation and displacement. Hip joint is normal. No other pelvic fracture IMPRESSION: Displaced fracture right femoral neck. Electronically Signed   By: Franchot Gallo M.D.   On: 02/16/2017 20:52    Assessment/Plan 1. Acute blood loss anemia  Follow up with Orthopedist this afternoon for evaluation of wound drainage  Set up with outpatient surgical center for blood transfusion in the AM (if ortho does not admit pt for wound exploration  Ferrous sulfate 325 mg po  BID for anemia  Vitamin C 500 mg po  BID for potentiation of iron absorption   Hold Plavix and Lovenox yesterday and today  (Yesterday) Apply ice pack to the hip and wrap with with snug Acewrap to apply pressure   2. Closed displaced fracture of right femoral neck ([place])  Continue working with PT/OT  Continue exercises as taught by PT/OT  Ice to the hip at least QID and prn  Keep incision clean and dry  Change dressing daily and prn  Continue Norco 5/325 mg 1-2 tablets po Q 6 hours prn  Follow up with orthopedist this afternoon for evaluation of wound drainage  Family/ staff Communication:   Total Time: 35 minutes  Documentation: 10 minutes coordination of care  Face to Face: 25 minutes  Family/Phone: 25 minutes   Labs/tests ordered:  cbc  Medication list reviewed and assessed for continued appropriateness.  Vikki Ports, NP-C Geriatrics [facility] [phone], [place] [address] Cell Phone (Mon-Fri 8am-5pm):  [phone] On Call:  [phone] & follow prompts after 5pm & weekends Office Phone:  [phone] Office Fax:  [phone]

## 2017-02-21 NOTE — Telephone Encounter (Signed)
Patient has been discharged to [facility] place will follow for TCM.

## 2017-02-22 ENCOUNTER — Ambulatory Visit
Admission: RE | Admit: 2017-02-22 | Discharge: 2017-02-22 | Disposition: A | Discharge status: Home / Self Care | Payer: No Typology Code available for payment source | Source: Ambulatory Visit | Attending: Gerontology | Admitting: Gerontology

## 2017-02-22 ENCOUNTER — Other Ambulatory Visit
Admission: RE | Admit: 2017-02-22 | Discharge: 2017-02-22 | Disposition: A | Discharge status: Home / Self Care | Payer: No Typology Code available for payment source | Source: Ambulatory Visit | Attending: Gerontology | Admitting: Gerontology

## 2017-02-22 DIAGNOSIS — D62 Acute posthemorrhagic anemia: Secondary | ICD-10-CM | POA: Insufficient documentation

## 2017-02-22 DIAGNOSIS — Z79899 Other long term (current) drug therapy: Secondary | ICD-10-CM | POA: Diagnosis not present

## 2017-02-22 LAB — CBC WITH DIFFERENTIAL/PLATELET
BASOS PCT: 0 %
Basophils Absolute: 0 10*3/uL (ref 0–0.1)
EOS ABS: 0.5 10*3/uL (ref 0–0.7)
EOS PCT: 5 %
HCT: 18.9 % — ABNORMAL LOW (ref 35.0–47.0)
HEMOGLOBIN: 6.1 g/dL — AB (ref 12.0–16.0)
LYMPHS ABS: 1.2 10*3/uL (ref 1.0–3.6)
Lymphocytes Relative: 12 %
MCH: 27.4 pg (ref 26.0–34.0)
MCHC: 32.5 g/dL (ref 32.0–36.0)
MCV: 84.4 fL (ref 80.0–100.0)
MONO ABS: 0.8 10*3/uL (ref 0.2–0.9)
MONOS PCT: 7 %
NEUTROS PCT: 76 %
Neutro Abs: 7.9 10*3/uL — ABNORMAL HIGH (ref 1.4–6.5)
PLATELETS: 321 10*3/uL (ref 150–440)
RBC: 2.23 MIL/uL — ABNORMAL LOW (ref 3.80–5.20)
RDW: 14 % (ref 11.5–14.5)
WBC: 10.4 10*3/uL (ref 3.6–11.0)

## 2017-02-22 LAB — PREPARE RBC (CROSSMATCH)

## 2017-02-22 LAB — COMPREHENSIVE METABOLIC PANEL
ALBUMIN: 2.5 g/dL — AB (ref 3.5–5.0)
ALT: 16 U/L (ref 14–54)
AST: 17 U/L (ref 15–41)
Alkaline Phosphatase: 39 U/L (ref 38–126)
Anion gap: 9 (ref 5–15)
BILIRUBIN TOTAL: 0.4 mg/dL (ref 0.3–1.2)
BUN: 50 mg/dL — AB (ref 6–20)
CALCIUM: 8.3 mg/dL — AB (ref 8.9–10.3)
CHLORIDE: 105 mmol/L (ref 101–111)
CO2: 23 mmol/L (ref 22–32)
CREATININE: 1.6 mg/dL — AB (ref 0.44–1.00)
GFR, EST AFRICAN AMERICAN: 31 mL/min — AB (ref >60)
GFR, EST NON AFRICAN AMERICAN: 27 mL/min — AB (ref >60)
Glucose, Bld: 132 mg/dL — ABNORMAL HIGH (ref 65–99)
Potassium: 3.3 mmol/L — ABNORMAL LOW (ref 3.5–5.1)
SODIUM: 137 mmol/L (ref 135–145)
TOTAL PROTEIN: 5.6 g/dL — AB (ref 6.5–8.1)

## 2017-02-22 LAB — GLUCOSE, CAPILLARY: GLUCOSE-CAPILLARY: 225 mg/dL — AB (ref 65–99)

## 2017-02-22 MED ORDER — SODIUM CHLORIDE 0.9 % IV SOLN: Freq: Once | INTRAVENOUS | Status: DC

## 2017-02-23 ENCOUNTER — Other Ambulatory Visit
Admission: RE | Admit: 2017-02-23 | Discharge: 2017-02-23 | Disposition: A | Discharge status: Home / Self Care | Payer: No Typology Code available for payment source | Source: Skilled Nursing Facility | Attending: Internal Medicine | Admitting: Internal Medicine

## 2017-02-23 DIAGNOSIS — D649 Anemia, unspecified: Secondary | ICD-10-CM | POA: Insufficient documentation

## 2017-02-23 LAB — CBC WITH DIFFERENTIAL/PLATELET
Basophils Absolute: 0 10*3/uL (ref 0–0.1)
Basophils Relative: 0 %
EOS PCT: 4 %
Eosinophils Absolute: 0.4 10*3/uL (ref 0–0.7)
HCT: 27.2 % — ABNORMAL LOW (ref 35.0–47.0)
Hemoglobin: 9.5 g/dL — ABNORMAL LOW (ref 12.0–16.0)
LYMPHS ABS: 0.7 10*3/uL — AB (ref 1.0–3.6)
Lymphocytes Relative: 6 %
MCH: 29.5 pg (ref 26.0–34.0)
MCHC: 35 g/dL (ref 32.0–36.0)
MCV: 84.2 fL (ref 80.0–100.0)
MONO ABS: 0.9 10*3/uL (ref 0.2–0.9)
MONOS PCT: 8 %
NEUTROS ABS: 9.2 10*3/uL — AB (ref 1.4–6.5)
Neutrophils Relative %: 82 %
PLATELETS: 362 10*3/uL (ref 150–440)
RBC: 3.23 MIL/uL — AB (ref 3.80–5.20)
RDW: 13.5 % (ref 11.5–14.5)
WBC: 11.2 10*3/uL — AB (ref 3.6–11.0)

## 2017-02-23 LAB — BPAM RBC
BLOOD PRODUCT EXPIRATION DATE: 201810302359
Blood Product Expiration Date: 201810312359
ISSUE DATE / TIME: 201810251309
ISSUE DATE / TIME: 201810251025
UNIT TYPE AND RH: 9500
Unit Type and Rh: 9500

## 2017-02-23 LAB — TYPE AND SCREEN
ABO/RH(D): O NEG
ANTIBODY SCREEN: NEGATIVE
UNIT DIVISION: 0
Unit division: 0

## 2017-02-24 ENCOUNTER — Other Ambulatory Visit
Admission: RE | Admit: 2017-02-24 | Discharge: 2017-02-24 | Disposition: A | Discharge status: Home / Self Care | Payer: No Typology Code available for payment source | Source: Skilled Nursing Facility | Attending: Internal Medicine | Admitting: Internal Medicine

## 2017-02-24 DIAGNOSIS — D649 Anemia, unspecified: Secondary | ICD-10-CM | POA: Insufficient documentation

## 2017-02-24 LAB — CBC WITH DIFFERENTIAL/PLATELET
BASOS PCT: 0 %
Band Neutrophils: 6 %
Basophils Absolute: 0 10*3/uL (ref 0–0.1)
Blasts: 0 %
EOS PCT: 5 %
Eosinophils Absolute: 0.7 10*3/uL (ref 0–0.7)
HEMATOCRIT: 28.5 % — AB (ref 35.0–47.0)
HEMOGLOBIN: 9.8 g/dL — AB (ref 12.0–16.0)
LYMPHS PCT: 13 %
Lymphs Abs: 1.7 10*3/uL (ref 1.0–3.6)
MCH: 29.3 pg (ref 26.0–34.0)
MCHC: 34.5 g/dL (ref 32.0–36.0)
MCV: 85 fL (ref 80.0–100.0)
Metamyelocytes Relative: 2 %
Monocytes Absolute: 1.2 10*3/uL — ABNORMAL HIGH (ref 0.2–0.9)
Monocytes Relative: 9 %
Myelocytes: 2 %
NEUTROS PCT: 63 %
NRBC: 0 /100{WBCs}
Neutro Abs: 9.4 10*3/uL — ABNORMAL HIGH (ref 1.4–6.5)
OTHER: 0 %
PROMYELOCYTES ABS: 0 %
Platelets: 435 10*3/uL (ref 150–440)
RBC: 3.36 MIL/uL — AB (ref 3.80–5.20)
RDW: 14.1 % (ref 11.5–14.5)
WBC: 13 10*3/uL — AB (ref 3.6–11.0)

## 2017-02-26 ENCOUNTER — Other Ambulatory Visit
Admission: RE | Admit: 2017-02-26 | Discharge: 2017-02-26 | Disposition: A | Discharge status: Home / Self Care | Payer: No Typology Code available for payment source | Source: Ambulatory Visit | Attending: Internal Medicine | Admitting: Internal Medicine

## 2017-02-26 DIAGNOSIS — D649 Anemia, unspecified: Secondary | ICD-10-CM | POA: Insufficient documentation

## 2017-02-26 DIAGNOSIS — K921 Melena: Secondary | ICD-10-CM | POA: Insufficient documentation

## 2017-02-26 LAB — CBC WITH DIFFERENTIAL/PLATELET
BASOS PCT: 0 %
Band Neutrophils: 2 %
Basophils Absolute: 0 10*3/uL (ref 0–0.1)
Blasts: 0 %
EOS PCT: 1 %
Eosinophils Absolute: 0.1 10*3/uL (ref 0–0.7)
HEMATOCRIT: 30.1 % — AB (ref 35.0–47.0)
Hemoglobin: 10.4 g/dL — ABNORMAL LOW (ref 12.0–16.0)
LYMPHS ABS: 1.3 10*3/uL (ref 1.0–3.6)
LYMPHS PCT: 11 %
MCH: 29.2 pg (ref 26.0–34.0)
MCHC: 34.5 g/dL (ref 32.0–36.0)
MCV: 84.7 fL (ref 80.0–100.0)
MONO ABS: 0.7 10*3/uL (ref 0.2–0.9)
MONOS PCT: 6 %
MYELOCYTES: 1 %
Metamyelocytes Relative: 2 %
NEUTROS PCT: 77 %
NRBC: 0 /100{WBCs}
Neutro Abs: 9.5 10*3/uL — ABNORMAL HIGH (ref 1.4–6.5)
OTHER: 0 %
Platelets: 499 10*3/uL — ABNORMAL HIGH (ref 150–440)
Promyelocytes Absolute: 0 %
RBC: 3.55 MIL/uL — AB (ref 3.80–5.20)
RDW: 14 % (ref 11.5–14.5)
SMEAR REVIEW: ADEQUATE
WBC: 11.6 10*3/uL — AB (ref 3.6–11.0)

## 2017-02-26 LAB — C DIFFICILE QUICK SCREEN W PCR REFLEX
C DIFFICILE (CDIFF) INTERP: NOT DETECTED
C Diff antigen: NEGATIVE
C Diff toxin: NEGATIVE

## 2017-02-27 ENCOUNTER — Other Ambulatory Visit
Admission: RE | Admit: 2017-02-27 | Discharge: 2017-02-27 | Disposition: A | Discharge status: Home / Self Care | Payer: No Typology Code available for payment source | Source: Ambulatory Visit | Attending: Internal Medicine | Admitting: Internal Medicine

## 2017-02-27 DIAGNOSIS — D649 Anemia, unspecified: Secondary | ICD-10-CM | POA: Insufficient documentation

## 2017-02-27 LAB — CBC WITH DIFFERENTIAL/PLATELET
BAND NEUTROPHILS: 0 %
BASOS ABS: 0 10*3/uL (ref 0–0.1)
Basophils Relative: 0 %
Blasts: 0 %
EOS PCT: 3 %
Eosinophils Absolute: 0.3 10*3/uL (ref 0–0.7)
HEMATOCRIT: 29.4 % — AB (ref 35.0–47.0)
Hemoglobin: 10 g/dL — ABNORMAL LOW (ref 12.0–16.0)
LYMPHS ABS: 0.6 10*3/uL — AB (ref 1.0–3.6)
Lymphocytes Relative: 7 %
MCH: 29 pg (ref 26.0–34.0)
MCHC: 34 g/dL (ref 32.0–36.0)
MCV: 85.4 fL (ref 80.0–100.0)
MONOS PCT: 5 %
Metamyelocytes Relative: 1 %
Monocytes Absolute: 0.5 10*3/uL (ref 0.2–0.9)
Myelocytes: 1 %
NEUTROS ABS: 7.6 10*3/uL — AB (ref 1.4–6.5)
Neutrophils Relative %: 83 %
Other: 0 %
Platelets: 489 10*3/uL — ABNORMAL HIGH (ref 150–440)
Promyelocytes Absolute: 0 %
RBC: 3.44 MIL/uL — ABNORMAL LOW (ref 3.80–5.20)
RDW: 14 % (ref 11.5–14.5)
WBC: 9 10*3/uL (ref 3.6–11.0)
nRBC: 0 /100 WBC

## 2017-02-27 LAB — C DIFFICILE QUICK SCREEN W PCR REFLEX
C DIFFICILE (CDIFF) INTERP: NOT DETECTED
C DIFFICILE (CDIFF) TOXIN: NEGATIVE
C DIFFICLE (CDIFF) ANTIGEN: NEGATIVE

## 2017-02-27 LAB — OCCULT BLOOD X 1 CARD TO LAB, STOOL: FECAL OCCULT BLD: POSITIVE — AB

## 2017-03-01 ENCOUNTER — Encounter
Admission: RE | Admit: 2017-03-01 | Discharge: 2017-03-01 | Disposition: A | Discharge status: Home / Self Care | Payer: Medicare Other | Source: Ambulatory Visit | Attending: Internal Medicine | Admitting: Internal Medicine

## 2017-03-01 ENCOUNTER — Non-Acute Institutional Stay (SKILLED_NURSING_FACILITY): Payer: Medicare Other | Admitting: Gerontology

## 2017-03-01 ENCOUNTER — Ambulatory Visit: Payer: Medicare Other | Admitting: Internal Medicine

## 2017-03-01 ENCOUNTER — Encounter: Payer: Self-pay | Admitting: Gerontology

## 2017-03-01 ENCOUNTER — Other Ambulatory Visit
Admission: RE | Admit: 2017-03-01 | Discharge: 2017-03-01 | Disposition: A | Discharge status: Home / Self Care | Payer: Medicare Other | Source: Ambulatory Visit | Attending: Internal Medicine | Admitting: Internal Medicine

## 2017-03-01 DIAGNOSIS — S72001A Fracture of unspecified part of neck of right femur, initial encounter for closed fracture: Secondary | ICD-10-CM

## 2017-03-01 DIAGNOSIS — D62 Acute posthemorrhagic anemia: Secondary | ICD-10-CM | POA: Diagnosis not present

## 2017-03-01 DIAGNOSIS — D649 Anemia, unspecified: Secondary | ICD-10-CM | POA: Insufficient documentation

## 2017-03-01 LAB — CBC WITH DIFFERENTIAL/PLATELET
Basophils Absolute: 0 10*3/uL (ref 0–0.1)
Basophils Relative: 1 %
EOS PCT: 3 %
Eosinophils Absolute: 0.3 10*3/uL (ref 0–0.7)
HCT: 28.4 % — ABNORMAL LOW (ref 35.0–47.0)
HEMOGLOBIN: 9.7 g/dL — AB (ref 12.0–16.0)
LYMPHS ABS: 0.9 10*3/uL — AB (ref 1.0–3.6)
Lymphocytes Relative: 9 %
MCH: 29 pg (ref 26.0–34.0)
MCHC: 34.2 g/dL (ref 32.0–36.0)
MCV: 84.9 fL (ref 80.0–100.0)
MONO ABS: 0.6 10*3/uL (ref 0.2–0.9)
MONOS PCT: 7 %
NEUTROS PCT: 80 %
Neutro Abs: 7.4 10*3/uL — ABNORMAL HIGH (ref 1.4–6.5)
Platelets: 499 10*3/uL — ABNORMAL HIGH (ref 150–440)
RBC: 3.35 MIL/uL — ABNORMAL LOW (ref 3.80–5.20)
RDW: 14.1 % (ref 11.5–14.5)
WBC: 9.2 10*3/uL (ref 3.6–11.0)

## 2017-03-01 NOTE — Progress Notes (Signed)
Location:   [facility] Room Number: 536U Place of Service:  SNF [phone]) Provider:  Toni Arthurs, NP-C  [name], MD  Patient Care Team: [name], MD as PCP - General (Internal Medicine) [name], MD (Internal Medicine) Bary Castilla [place] Gleason, MD (General Surgery)  Extended Emergency Contact Information Primary Emergency Contact: Fransico Setters [place] Phone: [phone] Work Phone: [phone] Mobile Phone: [phone] Relation: Daughter Secondary Emergency Contact: Renne Crigler [place] Mobile Phone: [phone] Relation: Son  Code Status:  FULL Goals of care: Advanced Directive information Advanced Directives 03/01/2017  Does Patient Have a Medical Advance Directive? No  Type of Advance Directive -  Does patient want to make changes to medical advance directive? -  Copy of [place] in Chart? -  Would patient like information on creating a medical advance directive? -     Chief Complaint  Patient presents with  . Medical Management of Chronic Issues    Routine Visit    HPI:  Pt is a 83 y.o. female seen today for medical management of chronic diseases.  Patient was admitted to the facility following hospitalization for a fall leading to closed right hip fracture with subsequent right hemiarthroplasty.  Patient is participating in PT and OT.  Patient reports pain is well controlled on current regimen.  Patient reports appetite is fair.  Having regular BMs.  Patient had blood transfusion on 10/25 with 2 units packed red blood cells for a hemoglobin of 6.1.  Patient underwent serial CBCs after transfusion with minimal fluctuation.  Incision well approximated.  Dressing CDI.  Patient reports she is feeling well.  Patient and I had long discussion about previous findings on CT scan of a suspected GI mass.  The mass was unable to be located by general surgery.  Patient and I  discussed follow-up about this.  Patient verbalizes she does not wish to have any invasive treatments if the "mass" is suspected to be cancer.  However, she would like to have a repeat CT scan of the abdomen for evaluation of mass growth.  Vital signs stable.  No other complaints.   Past Medical History:  Diagnosis Date  . Asthma without status asthmaticus    unspecified  . CAD (coronary artery disease)   . Colitis    unspecified  . Diabetes mellitus   . Diabetes mellitus type 2, uncomplicated ([place])   . GERD (gastroesophageal reflux disease)   . 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 Hyperlipidemia    unspecified  . Hypertension   . Myocardial infarction ([place]) 2018  . Positive H. pylori test   . Viral gastroenteritis due to [place]-like agent Nov 2012   Past Surgical History:  Procedure Laterality Date  . ABDOMINAL HYSTERECTOMY    . BLADDER SURGERY    . CARDIAC CATHETERIZATION    . CHOLECYSTECTOMY    . COLONOSCOPY     06/16/1987, 02/20/1995, 05/04/1999, 02/14/2005, 05/17/2007  . ESOPHAGOGASTRODUODENOSCOPY     05/11/1987, 06/13/1995, 10/09/1995, 05/04/1999, 01/16/2002  . HIP ARTHROPLASTY Right 02/17/2017   Procedure: ARTHROPLASTY BIPOLAR HIP (HEMIARTHROPLASTY);  Surgeon: Claud Kelp, MD;  Location: [facility] ORS;  Service: Orthopedics;  Laterality: Right;  . LEFT HEART CATH AND CORONARY ANGIOGRAPHY N/A 08/31/2016   Procedure: Left Heart Cath and Coronary Angiography;  Surgeon: Wellington Hampshire, MD;  Location: [facility];  Service: Cardiovascular;  Laterality: N/A;    Allergies  Allergen Reactions  . Aspirin Swelling  . Atropine Other (See Comments)    Reaction: unknown  . Belladonna Alkaloids Other (See Comments)    Reaction: unknown  . Codeine Other (See Comments)    Reaction: unknown  . Darvon Other (See Comments)    Reaction: unknown  . Methocarbamol Other (See Comments)    Reaction: unknown  . Penicillins Other (See Comments)    Reaction:  unknown Has patient had a PCN reaction causing immediate rash, facial/tongue/throat swelling, SOB or lightheadedness with hypotension: Unknown Has patient had a PCN reaction causing severe rash involving mucus membranes or skin necrosis: Unknown Has patient had a PCN reaction that required hospitalization: Unknown Has patient had a PCN reaction occurring within the last 10 years: Unknown If all of the above answers are "NO", then may proceed with Cephalosporin use.   . Sulfa Antibiotics Other (See Comments)    Reaction: unknown  . Iron Other (See Comments)    High dose prescription gives her diarrhea    Allergies as of 03/01/2017      Reactions   Aspirin Swelling   Atropine Other (See Comments)   Reaction: unknown   Belladonna Alkaloids Other (See Comments)   Reaction: unknown   Codeine Other (See Comments)   Reaction: unknown   Darvon Other (See Comments)   Reaction: unknown   Methocarbamol Other (See Comments)   Reaction: unknown   Penicillins Other (See Comments)   Reaction: unknown Has patient had a PCN reaction causing immediate rash, facial/tongue/throat swelling, SOB or lightheadedness with hypotension: Unknown Has patient had a PCN reaction causing severe rash involving mucus membranes or skin necrosis: Unknown Has patient had a PCN reaction that required hospitalization: Unknown Has patient had a PCN reaction occurring within the last 10 years: Unknown If all of the above answers are "NO", then may proceed with Cephalosporin use.   Sulfa Antibiotics Other (See Comments)   Reaction: unknown   Iron Other (See Comments)   High dose prescription gives her diarrhea      Medication List       Accurate as of 03/01/17  3:22 PM. Always use your most recent med list.          amLODipine 2.5 MG tablet Commonly known as:  NORVASC Take 2.5 mg by mouth daily.   atorvastatin 40 MG tablet Commonly known as:  LIPITOR TAKE 1 TABLET BY MOUTH DAILY AT 6PM   BD MICROTAINER  LANCETS Misc Contact  Activated lancet 1.8 mm x 21 g Use once daily to check blood sugars   bisacodyl 10 MG suppository Commonly known as:  DULCOLAX Place 10 mg rectally daily as needed.   Cholecalciferol 4000 units Caps Take 1 capsule by mouth daily.   clopidogrel 75 MG tablet Commonly known as:  PLAVIX Take 1 tablet (75 mg total) by mouth daily with breakfast.   enoxaparin 30 MG/0.3ML injection Commonly known as:  LOVENOX Inject 0.3 mLs (30 mg total) into the skin daily.   fenofibrate 160 MG tablet TAKE 1 TABLET BY MOUTH DAILY FOR HIGH TRIGLYCERIDES   glucose blood test strip Commonly known as:  BAYER CONTOUR TEST Use as instructed   HYDROcodone-acetaminophen 5-325 MG tablet Commonly known as:  NORCO/VICODIN Take 1-2 tablets by mouth every 6 (six) hours as needed for moderate pain. 1 tab for pain level 1-4 ; 2 tabs for pain level 5-10   loratadine 10 MG tablet Commonly known as:  CLARITIN Take 10 mg by mouth daily.   losartan-hydrochlorothiazide  100-12.5 MG tablet Commonly known as:  HYZAAR Take 1 tablet by mouth daily.   metFORMIN 750 MG 24 hr tablet Commonly known as:  GLUCOPHAGE-XR TAKE 1 TABLET BY MOUTH DAILY WITH BREAKFAST   metoprolol succinate 50 MG 24 hr tablet Commonly known as:  TOPROL-XL TAKE 1 TABLET BY MOUTH DAILY   mupirocin ointment 2 % Commonly known as:  BACTROBAN Apply 1 application topically 2 (two) times daily. To left earlobe   nitroGLYCERIN 0.4 MG SL tablet Commonly known as:  NITROSTAT Place 1 tablet (0.4 mg total) under the tongue every 5 (five) minutes as needed for chest pain.   ondansetron 4 MG disintegrating tablet Commonly known as:  ZOFRAN-ODT Take 4 mg by mouth every 8 (eight) hours as needed for nausea or vomiting.   pantoprazole 40 MG tablet Commonly known as:  PROTONIX Take 40 mg by mouth daily.   phenylephrine-shark liver oil-mineral oil-petrolatum 0.25-3-14-71.9 % rectal ointment Commonly known as:  PREPARATION  H Place 1 application rectally as needed for hemorrhoids.   polyethylene glycol packet Commonly known as:  MIRALAX / GLYCOLAX Take 17 g by mouth every other day.   senna 8.6 MG Tabs tablet Commonly known as:  SENOKOT Take 2 tablets by mouth 2 (two) times daily as needed.   sertraline 50 MG tablet Commonly known as:  ZOLOFT Take 50 mg by mouth daily.   shark liver oil-cocoa butter 0.25-3-85.5 % suppository Commonly known as:  PREPARATION H Place 1 suppository rectally 3 (three) times daily. Until hemorrhoids resolve   TUMS 500 MG chewable tablet Generic drug:  calcium carbonate Chew 1 tablet by mouth 3 (three) times daily as needed for heartburn.   vitamin C 500 MG tablet Commonly known as:  ASCORBIC ACID Take 500 mg by mouth 2 (two) times daily.       Review of Systems  Constitutional: Negative for activity change, appetite change, chills, diaphoresis and fever.  HENT: Negative for congestion, sneezing, sore throat, trouble swallowing and voice change.   Respiratory: Negative for apnea, cough, choking, chest tightness, shortness of breath and wheezing.   Cardiovascular: Negative for chest pain, palpitations and leg swelling.  Gastrointestinal: Negative for abdominal distention, abdominal pain, constipation, diarrhea and nausea.  Genitourinary: Negative for difficulty urinating, dysuria, frequency and urgency.  Musculoskeletal: Positive for arthralgias (typical arthritis). Negative for back pain, gait problem and myalgias.  Skin: Positive for wound. Negative for color change, pallor and rash.  Neurological: Negative for dizziness, tremors, syncope, speech difficulty, weakness, numbness and headaches.  Psychiatric/Behavioral: Negative for agitation and behavioral problems.  All other systems reviewed and are negative.   Immunization History  Administered Date(s) Administered  . Influenza Split 02/01/2011  . Influenza,inj,Quad PF,6+ Mos 01/08/2013, 12/28/2014  .  Influenza-Unspecified 01/30/2012, 01/29/2014, 01/25/2016  . Pneumococcal Conjugate-13 07/31/2013, 09/07/2014  . Pneumococcal Polysaccharide-23 08/01/2006, 02/18/2017  . Tdap 05/17/2008  . Zoster 11/14/2010   Pertinent  Health Maintenance Due  Topic Date Due  . DEXA SCAN  03/11/1990  . OPHTHALMOLOGY EXAM  06/30/2015  . FOOT EXAM  12/28/2015  . INFLUENZA VACCINE  11/29/2016  . HEMOGLOBIN A1C  06/06/2017  . PNA vac Low Risk Adult  Completed   Fall Risk  09/07/2016 05/22/2015 07/08/2014 01/27/2013 04/17/2012  Falls in the past year? No No Yes Yes Yes  Number falls in past yr: - - 1 1 1   Injury with Fall? - - Yes No Yes  Risk for fall due to : - - Impaired mobility Impaired balance/gait -  Follow up - - Falls prevention discussed - -   Functional Status Survey:    Vitals:   03/01/17 1520  BP: (!) 176/68  Pulse: 77  Resp: (!) 24  Temp: 99.4 F (37.4 C)  SpO2: 96%  Weight: 126 lb 8 oz (57.4 kg)  Height: 5\' 3"  (1.6 m)   Body mass index is 22.41 kg/m. Physical Exam  Constitutional: She is oriented to person, place, and time. Vital signs are normal. She appears well-developed and well-nourished. She is active and cooperative. She does not appear ill. No distress.  HENT:  Head: Normocephalic and atraumatic.  Mouth/Throat: Uvula is midline, oropharynx is clear and moist and mucous membranes are normal. Mucous membranes are not pale, not dry and not cyanotic.  Eyes: Conjunctivae, EOM and lids are normal. Pupils are equal, round, and reactive to light.  Neck: Trachea normal, normal range of motion and full passive range of motion without pain. Neck supple. No JVD present. No tracheal deviation, no edema and no erythema present. No thyromegaly present.  Cardiovascular: Normal rate, regular rhythm, intact distal pulses and normal pulses. Exam reveals no gallop and no distant heart sounds.  Murmur heard. Pulses:      Dorsalis pedis pulses are 2+ on the right side, and 2+ on the left side.    No edema  Pulmonary/Chest: Effort normal and breath sounds normal. No accessory muscle usage. No respiratory distress. She has no decreased breath sounds. She has no wheezes. She has no rhonchi. She has no rales. She exhibits no tenderness.  Abdominal: Soft. Normal appearance and bowel sounds are normal. She exhibits no distension and no ascites. There is no tenderness.  Musculoskeletal: She exhibits no edema or tenderness.       Right hip: She exhibits decreased range of motion, decreased strength and laceration.  Expected osteoarthritis, stiffness, calves soft, supple. Negative Homan's sign  Neurological: She is alert and oriented to person, place, and time. She has normal strength.  Skin: Skin is warm and dry. Laceration noted. She is not diaphoretic. No cyanosis. No pallor. Nails show no clubbing.  Right hip incision CDI, well approximated  Psychiatric: She has a normal mood and affect. Her speech is normal and behavior is normal. Judgment and thought content normal. Cognition and memory are normal.  Nursing note and vitals reviewed.   Labs reviewed:  Recent Labs  11/01/16 0716  11/03/16 0338  02/16/17 2000 02/17/17 0349 02/22/17 0525  NA  --   < > 137  < > 138 137 137  K  --   < > 3.5  < > 3.8 4.0 3.3*  CL  --   < > 114*  < > 105 107 105  CO2  --   < > 18*  < > 22 24 23   GLUCOSE  --   < > 107*  < > 197* 198* 132*  BUN  --   < > 19  < > 48* 45* 50*  CREATININE  --   < > 1.05*  < > 1.21* 1.16* 1.60*  CALCIUM  --   < > 8.3*  < > 9.4 9.0 8.3*  MG 1.8  --  1.7  --   --   --   --   < > = values in this interval not displayed.  Recent Labs  12/04/16 0808 02/16/17 2000 02/22/17 0525  AST 13 24 17   ALT 14 21 16   ALKPHOS 43 66 39  BILITOT 0.4 0.5 0.4  PROT  6.6 6.9 5.6*  ALBUMIN 4.0 4.0 2.5*    Recent Labs  02/26/17 1423 02/27/17 1030 03/01/17 1130  WBC 11.6* 9.0 9.2  NEUTROABS 9.5* 7.6* 7.4*  HGB 10.4* 10.0* 9.7*  HCT 30.1* 29.4* 28.4*  MCV 84.7 85.4 84.9  PLT  499* 489* 499*   Lab Results  Component Value Date   TSH 3.76 08/27/2015   Lab Results  Component Value Date   HGBA1C 7.0 (H) 12/04/2016   Lab Results  Component Value Date   CHOL 132 12/04/2016   HDL 38.30 (L) 12/04/2016   LDLCALC 68 12/04/2016   LDLDIRECT 92.0 07/06/2016   TRIG 128.0 12/04/2016   CHOLHDL 3 12/04/2016    Significant Diagnostic Results in last 30 days:  Dg Pelvis Portable  Result Date: 02/17/2017 CLINICAL DATA:  Right hip replacement EXAM: PORTABLE PELVIS 1-2 VIEWS COMPARISON:  None. FINDINGS: Right hip arthroplasty without failure or complication. No hardware failure or complication. Postsurgical changes in the surrounding soft tissues. No fracture or dislocation.  Generalized osteopenia. IMPRESSION: Interval right hip arthroplasty without failure or complication. Electronically Signed   By: Kathreen Devoid   On: 02/17/2017 13:47   Dg Chest Port 1 View  Result Date: 02/16/2017 CLINICAL DATA:  Hip fracture preop EXAM: PORTABLE CHEST 1 VIEW COMPARISON:  11/02/2016 FINDINGS: Heart size upper normal. Vascularity normal. Prominent lung markings appear chronic. No focal infiltrate or effusion. IMPRESSION: No active disease. Electronically Signed   By: Franchot Gallo M.D.   On: 02/16/2017 20:53   Dg Hip Unilat  With Pelvis 2-3 Views Right  Result Date: 02/16/2017 CLINICAL DATA:  Right hip pain.  Fall EXAM: DG HIP (WITH OR WITHOUT PELVIS) 2-3V RIGHT COMPARISON:  None. FINDINGS: Right femoral neck fracture with angulation and displacement. Hip joint is normal. No other pelvic fracture IMPRESSION: Displaced fracture right femoral neck. Electronically Signed   By: Franchot Gallo M.D.   On: 02/16/2017 20:52    Assessment/Plan 1.  Closed displaced fracture of right femoral neck  Continue PT/OT  Continue exercises as taught by PT/OT  Continue Norco 5/325 mg 1-2 tablets p.o. every 6 hours as needed pain  Ice to the hip as needed for pain or edema  Skin care per  protocol  Follow-up with orthopedist as instructed  2.  Acute blood loss anemia  Continue Preparation H suppositories 3 times daily  Continue Preparation H ointment to the external rectum as needed  Continue Protonix 40 mg p.o. twice daily  DC serial CBCs  CBCs weekly   Continue ferrous sulfate 325 mg p.o. Daily  Continue vitamin C 500 mg p.o. twice daily for potentiation of iron absorption   Family/ staff Communication:   Total Time:  Documentation:  Face to Face:  Family/Phone:   Labs/tests ordered: CBC 1 week  Medication list reviewed and assessed for continued appropriateness. Monthly medication orders reviewed and signed.  Vikki Ports, NP-C Geriatrics [facility] [phone], [place] [address] Cell Phone (Mon-Fri 8am-5pm):  [phone] On Call:  [phone] & follow prompts after 5pm & weekends Office Phone:  [phone] Office Fax:  [phone]

## 2017-03-08 ENCOUNTER — Non-Acute Institutional Stay (SKILLED_NURSING_FACILITY): Payer: Medicare Other | Admitting: Gerontology

## 2017-03-08 ENCOUNTER — Other Ambulatory Visit
Admission: RE | Admit: 2017-03-08 | Discharge: 2017-03-08 | Disposition: A | Discharge status: Home / Self Care | Payer: Medicare Other | Source: Ambulatory Visit | Attending: Gerontology | Admitting: Gerontology

## 2017-03-08 ENCOUNTER — Telehealth: Payer: Self-pay | Admitting: Internal Medicine

## 2017-03-08 ENCOUNTER — Encounter: Payer: Self-pay | Admitting: Gerontology

## 2017-03-08 ENCOUNTER — Other Ambulatory Visit: Payer: Self-pay | Admitting: Internal Medicine

## 2017-03-08 DIAGNOSIS — S72001A Fracture of unspecified part of neck of right femur, initial encounter for closed fracture: Secondary | ICD-10-CM | POA: Diagnosis not present

## 2017-03-08 DIAGNOSIS — D62 Acute posthemorrhagic anemia: Secondary | ICD-10-CM | POA: Insufficient documentation

## 2017-03-08 LAB — COMPREHENSIVE METABOLIC PANEL
ALT: 19 U/L (ref 14–54)
ANION GAP: 12 (ref 5–15)
AST: 23 U/L (ref 15–41)
Albumin: 3.4 g/dL — ABNORMAL LOW (ref 3.5–5.0)
Alkaline Phosphatase: 97 U/L (ref 38–126)
BILIRUBIN TOTAL: 0.7 mg/dL (ref 0.3–1.2)
BUN: 50 mg/dL — ABNORMAL HIGH (ref 6–20)
CALCIUM: 8.8 mg/dL — AB (ref 8.9–10.3)
CO2: 21 mmol/L — ABNORMAL LOW (ref 22–32)
Chloride: 103 mmol/L (ref 101–111)
Creatinine, Ser: 1.38 mg/dL — ABNORMAL HIGH (ref 0.44–1.00)
GFR, EST AFRICAN AMERICAN: 37 mL/min — AB (ref >60)
GFR, EST NON AFRICAN AMERICAN: 32 mL/min — AB (ref >60)
Glucose, Bld: 189 mg/dL — ABNORMAL HIGH (ref 65–99)
Potassium: 3.8 mmol/L (ref 3.5–5.1)
Sodium: 136 mmol/L (ref 135–145)
TOTAL PROTEIN: 6.5 g/dL (ref 6.5–8.1)

## 2017-03-08 LAB — CBC WITH DIFFERENTIAL/PLATELET
Basophils Absolute: 0.1 10*3/uL (ref 0–0.1)
Basophils Relative: 1 %
EOS ABS: 0.3 10*3/uL (ref 0–0.7)
Eosinophils Relative: 4 %
HEMATOCRIT: 28.6 % — AB (ref 35.0–47.0)
HEMOGLOBIN: 9.5 g/dL — AB (ref 12.0–16.0)
LYMPHS ABS: 1 10*3/uL (ref 1.0–3.6)
Lymphocytes Relative: 12 %
MCH: 28.5 pg (ref 26.0–34.0)
MCHC: 33.4 g/dL (ref 32.0–36.0)
MCV: 85.3 fL (ref 80.0–100.0)
MONOS PCT: 8 %
Monocytes Absolute: 0.7 10*3/uL (ref 0.2–0.9)
NEUTROS ABS: 6.5 10*3/uL (ref 1.4–6.5)
NEUTROS PCT: 75 %
Platelets: 458 10*3/uL — ABNORMAL HIGH (ref 150–440)
RBC: 3.35 MIL/uL — ABNORMAL LOW (ref 3.80–5.20)
RDW: 14.1 % (ref 11.5–14.5)
WBC: 8.6 10*3/uL (ref 3.6–11.0)

## 2017-03-08 MED ORDER — GLIPIZIDE 5 MG PO TABS: 5.0000 mg | ORAL_TABLET | Freq: Two times a day (BID) | ORAL | 3 refills | Status: DC

## 2017-03-08 NOTE — Telephone Encounter (Signed)
[name] or [name],   Can one of you call  the [facility] where Maureen Morales is rehabbing from her hip fracture (she is in room 342) and give them  a verbal order to start her on glipizide 5 mg by mouth twice daily before breakfast and dinner?  I can't find the number anywhere in the chart .  The rx went to [facility] instead of to them bc I have no idea how to route things to them.    Thank you

## 2017-03-08 NOTE — Telephone Encounter (Signed)
Verbal order given to [place] at [facility]. FYI

## 2017-03-08 NOTE — Progress Notes (Signed)
Location:  [facility] Room Number: 810F Place of Service:  SNF [phone]) Provider:  Toni Arthurs, NP-C  [name], MD  Patient Care Team: [name], MD as PCP - General (Internal Medicine) [name], MD (Internal Medicine) Bary Castilla [place] Gleason, MD (General Surgery)  Extended Emergency Contact Information Primary Emergency Contact: Fransico Setters [place] Phone: [phone] Work Phone: [phone] Mobile Phone: [phone] Relation: Daughter Secondary Emergency Contact: Renne Crigler [place] Mobile Phone: [phone] Relation: Son  Code Status:  FULL Goals of care: Advanced Directive information Advanced Directives 03/08/2017  Does Patient Have a Medical Advance Directive? No  Type of Advance Directive -  Does patient want to make changes to medical advance directive? -  Copy of [place] in Chart? -  Would patient like information on creating a medical advance directive? -     Chief Complaint  Patient presents with  . Medical Management of Chronic Issues    Routine Visit    HPI:  Pt is a 83 y.o. female seen today for medical management of chronic diseases.  Patient was admitted to the facility following hospitalization for fall leading to closed right hip fracture with subsequent right hemiarthroplasty.  Patient is participating in PT and OT.  Patient reports pain is well controlled on current regimen.  Patient reports appetite is fair.  Having regular BMs.  Patient had blood transfusion on 10/25 with 2 units packed red blood cells for hemoglobin of 6.1.  Patient underwent serial CBCs after transfusion with minimal fluctuation.  Today, hemoglobin is 9.5 and stable.  Incision well approximated.  Dressing CDI.  Patient reports she is feeling well and has no complaints.  Vital signs stable.   Past Medical History:  Diagnosis Date  . Asthma without status asthmaticus    unspecified  . CAD (coronary artery disease)   . Colitis    unspecified  . Diabetes mellitus   . Diabetes mellitus type 2, uncomplicated ([place])   . GERD (gastroesophageal reflux disease)   . 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 Hyperlipidemia    unspecified  . Hypertension   . Myocardial infarction ([place]) 2018  . Positive H. pylori test   . Viral gastroenteritis due to [place]-like agent Nov 2012   Past Surgical History:  Procedure Laterality Date  . ABDOMINAL HYSTERECTOMY    . BLADDER SURGERY    . CARDIAC CATHETERIZATION    . CHOLECYSTECTOMY    . COLONOSCOPY     06/16/1987, 02/20/1995, 05/04/1999, 02/14/2005, 05/17/2007  . ESOPHAGOGASTRODUODENOSCOPY     05/11/1987, 06/13/1995, 10/09/1995, 05/04/1999, 01/16/2002    Allergies  Allergen Reactions  . Aspirin Swelling  . Atropine Other (See Comments)    Reaction: unknown  . Belladonna Alkaloids Other (See Comments)    Reaction: unknown  . Codeine Other (See Comments)    Reaction: unknown  . Darvon Other (See Comments)    Reaction: unknown  . Methocarbamol Other (See Comments)    Reaction: unknown  . Penicillins Other (See Comments)    Reaction: unknown Has patient had a PCN reaction causing immediate rash, facial/tongue/throat swelling, SOB or lightheadedness with hypotension: Unknown Has patient had a PCN reaction causing severe rash involving mucus membranes or skin necrosis: Unknown Has patient had a PCN reaction that required hospitalization: Unknown Has patient had a PCN reaction occurring within the last 10 years: Unknown If all of the above answers are "NO", then  may proceed with Cephalosporin use.   . Sulfa Antibiotics Other (See Comments)    Reaction: unknown  . Iron Other (See Comments)    High dose prescription gives her diarrhea    Allergies as of 03/08/2017      Reactions   Aspirin Swelling   Atropine Other (See Comments)   Reaction: unknown   Belladonna Alkaloids Other (See Comments)    Reaction: unknown   Codeine Other (See Comments)   Reaction: unknown   Darvon Other (See Comments)   Reaction: unknown   Methocarbamol Other (See Comments)   Reaction: unknown   Penicillins Other (See Comments)   Reaction: unknown Has patient had a PCN reaction causing immediate rash, facial/tongue/throat swelling, SOB or lightheadedness with hypotension: Unknown Has patient had a PCN reaction causing severe rash involving mucus membranes or skin necrosis: Unknown Has patient had a PCN reaction that required hospitalization: Unknown Has patient had a PCN reaction occurring within the last 10 years: Unknown If all of the above answers are "NO", then may proceed with Cephalosporin use.   Sulfa Antibiotics Other (See Comments)   Reaction: unknown   Iron Other (See Comments)   High dose prescription gives her diarrhea      Medication List        Accurate as of 03/08/17  2:26 PM. Always use your most recent med list.          amLODipine 2.5 MG tablet Commonly known as:  NORVASC Take 2.5 mg by mouth daily.   atorvastatin 40 MG tablet Commonly known as:  LIPITOR TAKE 1 TABLET BY MOUTH DAILY AT 6PM   BD MICROTAINER LANCETS Misc Contact  Activated lancet 1.8 mm x 21 g Use once daily to check blood sugars   bisacodyl 10 MG suppository Commonly known as:  DULCOLAX Place 10 mg rectally daily as needed.   Cholecalciferol 4000 units Caps Take 1 capsule by mouth daily.   clopidogrel 75 MG tablet Commonly known as:  PLAVIX Take 1 tablet (75 mg total) by mouth daily with breakfast.   fenofibrate 160 MG tablet TAKE 1 TABLET BY MOUTH DAILY FOR HIGH TRIGLYCERIDES   glucose blood test strip Commonly known as:  BAYER CONTOUR TEST Use as instructed   HYDROcodone-acetaminophen 5-325 MG tablet Commonly known as:  NORCO/VICODIN Take 1-2 tablets by mouth every 6 (six) hours as needed for moderate pain. 1 tab for pain level 1-4 ; 2 tabs for pain level 5-10   hydrocortisone cream 1  % Apply 1 application 4 (four) times daily as needed topically for itching. Apply to area of rash on left leg   loratadine 10 MG tablet Commonly known as:  CLARITIN Take 10 mg by mouth daily.   losartan-hydrochlorothiazide 100-12.5 MG tablet Commonly known as:  HYZAAR Take 1 tablet by mouth daily.   metoprolol succinate 50 MG 24 hr tablet Commonly known as:  TOPROL-XL TAKE 1 TABLET BY MOUTH DAILY   nitroGLYCERIN 0.4 MG SL tablet Commonly known as:  NITROSTAT Place 1 tablet (0.4 mg total) under the tongue every 5 (five) minutes as needed for chest pain.   ondansetron 4 MG disintegrating tablet Commonly known as:  ZOFRAN-ODT Take 4 mg by mouth every 8 (eight) hours as needed for nausea or vomiting.   pantoprazole 40 MG tablet Commonly known as:  PROTONIX Take 40 mg by mouth daily.   phenylephrine-shark liver oil-mineral oil-petrolatum 0.25-3-14-71.9 % rectal ointment Commonly known as:  PREPARATION H Place 1 application rectally as needed for hemorrhoids.  polyethylene glycol packet Commonly known as:  MIRALAX / GLYCOLAX Take 17 g by mouth every other day.   senna 8.6 MG Tabs tablet Commonly known as:  SENOKOT Take 2 tablets by mouth 2 (two) times daily as needed.   sertraline 50 MG tablet Commonly known as:  ZOLOFT Take 50 mg by mouth daily.   shark liver oil-cocoa butter 0.25-3-85.5 % suppository Commonly known as:  PREPARATION H Place 1 suppository rectally 3 (three) times daily. Until hemorrhoids resolve   TUMS 500 MG chewable tablet Generic drug:  calcium carbonate Chew 1 tablet by mouth 3 (three) times daily as needed for heartburn.   vitamin C 500 MG tablet Commonly known as:  ASCORBIC ACID Take 500 mg by mouth 2 (two) times daily.       Review of Systems  Constitutional: Negative for activity change, appetite change, chills, diaphoresis and fever.  HENT: Negative for congestion, sneezing, sore throat, trouble swallowing and voice change.    Respiratory: Negative for apnea, cough, choking, chest tightness, shortness of breath and wheezing.   Cardiovascular: Negative for chest pain, palpitations and leg swelling.  Gastrointestinal: Negative for abdominal distention, abdominal pain, constipation, diarrhea and nausea.  Genitourinary: Negative for difficulty urinating, dysuria, frequency and urgency.  Musculoskeletal: Positive for arthralgias (typical arthritis). Negative for back pain, gait problem and myalgias.  Skin: Positive for wound. Negative for color change, pallor and rash.  Neurological: Negative for dizziness, tremors, syncope, speech difficulty, weakness, numbness and headaches.  Psychiatric/Behavioral: Negative for agitation and behavioral problems.  All other systems reviewed and are negative.   Immunization History  Administered Date(s) Administered  . Influenza Split 02/01/2011  . Influenza,inj,Quad PF,6+ Mos 01/08/2013, 12/28/2014  . Influenza-Unspecified 01/30/2012, 01/29/2014, 01/25/2016  . Pneumococcal Conjugate-13 07/31/2013, 09/07/2014  . Pneumococcal Polysaccharide-23 08/01/2006, 02/18/2017  . Tdap 05/17/2008  . Zoster 11/14/2010   Pertinent  Health Maintenance Due  Topic Date Due  . DEXA SCAN  03/11/1990  . OPHTHALMOLOGY EXAM  06/30/2015  . FOOT EXAM  12/28/2015  . INFLUENZA VACCINE  11/29/2016  . HEMOGLOBIN A1C  06/06/2017  . PNA vac Low Risk Adult  Completed   Fall Risk  09/07/2016 05/22/2015 07/08/2014 01/27/2013 04/17/2012  Falls in the past year? No No Yes Yes Yes  Number falls in past yr: - - '1 1 1  ' Injury with Fall? - - Yes No Yes  Risk for fall due to : - - Impaired mobility Impaired balance/gait -  Follow up - - Falls prevention discussed - -   Functional Status Survey:    Vitals:   03/08/17 1412  BP: (!) 153/63  Pulse: 68  Resp: 18  Temp: 97.8 F (36.6 C)  TempSrc: Oral  SpO2: 97%  Weight: 121 lb 9.6 oz (55.2 kg)  Height: '5\' 3"'  (1.6 m)   Body mass index is 21.54  kg/m. Physical Exam  Constitutional: She is oriented to person, place, and time. Vital signs are normal. She appears well-developed and well-nourished. She is active and cooperative. She does not appear ill. No distress.  HENT:  Head: Normocephalic and atraumatic.  Mouth/Throat: Uvula is midline, oropharynx is clear and moist and mucous membranes are normal. Mucous membranes are not pale, not dry and not cyanotic.  Eyes: Conjunctivae, EOM and lids are normal. Pupils are equal, round, and reactive to light.  Neck: Trachea normal, normal range of motion and full passive range of motion without pain. Neck supple. No JVD present. No tracheal deviation, no edema and no erythema present.  No thyromegaly present.  Cardiovascular: Normal rate, regular rhythm, intact distal pulses and normal pulses. Exam reveals no gallop and no distant heart sounds.  Murmur heard. Pulses:      Dorsalis pedis pulses are 2+ on the right side, and 2+ on the left side.  No edema  Pulmonary/Chest: Effort normal and breath sounds normal. No accessory muscle usage. No respiratory distress. She has no decreased breath sounds. She has no wheezes. She has no rhonchi. She has no rales. She exhibits no tenderness.  Abdominal: Soft. Normal appearance and bowel sounds are normal. She exhibits no distension and no ascites. There is no tenderness.  Musculoskeletal: She exhibits no edema or tenderness.       Right hip: She exhibits decreased range of motion, decreased strength and laceration.  Expected osteoarthritis, stiffness.  Calves soft, supple.  Negative Homans sign  Neurological: She is alert and oriented to person, place, and time. She has normal strength.  Skin: Skin is warm and dry. Laceration noted. She is not diaphoretic. No cyanosis. No pallor. Nails show no clubbing.  Right hip incision CDI, well approximated  Psychiatric: She has a normal mood and affect. Her speech is normal and behavior is normal. Judgment and thought  content normal. Cognition and memory are normal.  Nursing note and vitals reviewed.   Labs reviewed: Recent Labs    11/01/16 0716  11/03/16 0338  02/16/17 2000 02/17/17 0349 02/22/17 0525  NA  --    < > 137   < > 138 137 137  K  --    < > 3.5   < > 3.8 4.0 3.3*  CL  --    < > 114*   < > 105 107 105  CO2  --    < > 18*   < > '22 24 23  ' GLUCOSE  --    < > 107*   < > 197* 198* 132*  BUN  --    < > 19   < > 48* 45* 50*  CREATININE  --    < > 1.05*   < > 1.21* 1.16* 1.60*  CALCIUM  --    < > 8.3*   < > 9.4 9.0 8.3*  MG 1.8  --  1.7  --   --   --   --    < > = values in this interval not displayed.   Recent Labs    12/04/16 0808 02/16/17 2000 02/22/17 0525  AST '13 24 17  ' ALT '14 21 16  ' ALKPHOS 43 66 39  BILITOT 0.4 0.5 0.4  PROT 6.6 6.9 5.6*  ALBUMIN 4.0 4.0 2.5*   Recent Labs    02/26/17 1423 02/27/17 1030 03/01/17 1130  WBC 11.6* 9.0 9.2  NEUTROABS 9.5* 7.6* 7.4*  HGB 10.4* 10.0* 9.7*  HCT 30.1* 29.4* 28.4*  MCV 84.7 85.4 84.9  PLT 499* 489* 499*   Lab Results  Component Value Date   TSH 3.76 08/27/2015   Lab Results  Component Value Date   HGBA1C 7.0 (H) 12/04/2016   Lab Results  Component Value Date   CHOL 132 12/04/2016   HDL 38.30 (L) 12/04/2016   LDLCALC 68 12/04/2016   LDLDIRECT 92.0 07/06/2016   TRIG 128.0 12/04/2016   CHOLHDL 3 12/04/2016    Significant Diagnostic Results in last 30 days:  Dg Pelvis Portable  Result Date: 02/17/2017 CLINICAL DATA:  Right hip replacement EXAM: PORTABLE PELVIS 1-2 VIEWS COMPARISON:  None. FINDINGS: Right hip arthroplasty  without failure or complication. No hardware failure or complication. Postsurgical changes in the surrounding soft tissues. No fracture or dislocation.  Generalized osteopenia. IMPRESSION: Interval right hip arthroplasty without failure or complication. Electronically Signed   By: Kathreen Devoid   On: 02/17/2017 13:47   Dg Chest Port 1 View  Result Date: 02/16/2017 CLINICAL DATA:  Hip fracture  preop EXAM: PORTABLE CHEST 1 VIEW COMPARISON:  11/02/2016 FINDINGS: Heart size upper normal. Vascularity normal. Prominent lung markings appear chronic. No focal infiltrate or effusion. IMPRESSION: No active disease. Electronically Signed   By: Franchot Gallo M.D.   On: 02/16/2017 20:53   Dg Hip Unilat  With Pelvis 2-3 Views Right  Result Date: 02/16/2017 CLINICAL DATA:  Right hip pain.  Fall EXAM: DG HIP (WITH OR WITHOUT PELVIS) 2-3V RIGHT COMPARISON:  None. FINDINGS: Right femoral neck fracture with angulation and displacement. Hip joint is normal. No other pelvic fracture IMPRESSION: Displaced fracture right femoral neck. Electronically Signed   By: Franchot Gallo M.D.   On: 02/16/2017 20:52    Assessment/Plan 1.  Closed displaced fracture of right femoral neck  Continue PT/OT  Continue exercises as taught by PT/OT  Continue Norco 5/325 mg 1-2 tablets p.o. every 6 hours as needed pain  Ice to the hip as needed for pain or edema  Skin care per protocol  Follow-up with orthopedist as instructed  2.  Acute blood loss anemia  Continue Preparation H suppositories 3 times daily  Continue per rectum as needed  Continue Protonix 40 mg p.o. twice daily  CBCs weekly  Continue ferrous sulfate 325 mg p.o. daily  Continue vitamin C 500 mg p.o. twice daily for potentiation of iron absorption  Follow-up with PCP ASAP after discharge for continuity of care   Family/ staff Communication:   Total Time:  Documentation:  Face to Face:  Family/Phone:   Labs/tests ordered: CBC and met C  Medication list reviewed and assessed for continued appropriateness. Monthly medication orders reviewed and signed.  Vikki Ports, NP-C Geriatrics [facility] [phone], [place] [address] Cell Phone (Mon-Fri 8am-5pm):  [phone] On Call:  [phone] & follow prompts after 5pm & weekends Office Phone:  [phone] Office Fax:   [phone]

## 2017-03-13 ENCOUNTER — Encounter: Payer: Self-pay | Admitting: Gerontology

## 2017-03-13 ENCOUNTER — Non-Acute Institutional Stay (SKILLED_NURSING_FACILITY): Payer: Medicare Other | Admitting: Gerontology

## 2017-03-13 ENCOUNTER — Other Ambulatory Visit: Payer: Self-pay | Admitting: Internal Medicine

## 2017-03-13 ENCOUNTER — Other Ambulatory Visit
Admission: RE | Admit: 2017-03-13 | Discharge: 2017-03-13 | Disposition: A | Discharge status: Home / Self Care | Payer: Medicare Other | Source: Ambulatory Visit | Attending: Gerontology | Admitting: Gerontology

## 2017-03-13 DIAGNOSIS — Z08 Encounter for follow-up examination after completed treatment for malignant neoplasm: Secondary | ICD-10-CM | POA: Insufficient documentation

## 2017-03-13 DIAGNOSIS — D62 Acute posthemorrhagic anemia: Secondary | ICD-10-CM | POA: Diagnosis not present

## 2017-03-13 DIAGNOSIS — S72001A Fracture of unspecified part of neck of right femur, initial encounter for closed fracture: Secondary | ICD-10-CM | POA: Diagnosis not present

## 2017-03-13 DIAGNOSIS — S72001D Fracture of unspecified part of neck of right femur, subsequent encounter for closed fracture with routine healing: Secondary | ICD-10-CM | POA: Insufficient documentation

## 2017-03-13 LAB — BASIC METABOLIC PANEL
ANION GAP: 10 (ref 5–15)
BUN: 37 mg/dL — ABNORMAL HIGH (ref 6–20)
CO2: 22 mmol/L (ref 22–32)
Calcium: 9.2 mg/dL (ref 8.9–10.3)
Chloride: 106 mmol/L (ref 101–111)
Creatinine, Ser: 1.14 mg/dL — ABNORMAL HIGH (ref 0.44–1.00)
GFR calc Af Amer: 47 mL/min — ABNORMAL LOW (ref >60)
GFR, EST NON AFRICAN AMERICAN: 40 mL/min — AB (ref >60)
Glucose, Bld: 125 mg/dL — ABNORMAL HIGH (ref 65–99)
POTASSIUM: 3.8 mmol/L (ref 3.5–5.1)
SODIUM: 138 mmol/L (ref 135–145)

## 2017-03-13 LAB — CBC WITH DIFFERENTIAL/PLATELET
Basophils Absolute: 0 10*3/uL (ref 0–0.1)
Basophils Relative: 0 %
EOS ABS: 0.4 10*3/uL (ref 0–0.7)
EOS PCT: 7 %
HCT: 27.7 % — ABNORMAL LOW (ref 35.0–47.0)
HEMOGLOBIN: 9.3 g/dL — AB (ref 12.0–16.0)
LYMPHS PCT: 21 %
Lymphs Abs: 1.4 10*3/uL (ref 1.0–3.6)
MCH: 28.6 pg (ref 26.0–34.0)
MCHC: 33.5 g/dL (ref 32.0–36.0)
MCV: 85.4 fL (ref 80.0–100.0)
Monocytes Absolute: 0.7 10*3/uL (ref 0.2–0.9)
Monocytes Relative: 11 %
NEUTROS PCT: 61 %
Neutro Abs: 4.1 10*3/uL (ref 1.4–6.5)
PLATELETS: 293 10*3/uL (ref 150–440)
RBC: 3.25 MIL/uL — AB (ref 3.80–5.20)
RDW: 14.5 % (ref 11.5–14.5)
WBC: 6.6 10*3/uL (ref 3.6–11.0)

## 2017-03-13 NOTE — Progress Notes (Signed)
Location:   [facility] Room Number: 025K Place of Service:  SNF [phone])  Provider: Toni Arthurs, NP-C  PCP: [name], MD Patient Care Team: [name], MD as PCP - General (Internal Medicine) [name], MD (Internal Medicine) Bary Castilla [place] Gleason, MD (General Surgery)  Extended Emergency Contact Information Primary Emergency Contact: Fransico Setters [place] of [place] Phone: [phone] Work Phone: [phone] Mobile Phone: [phone] Relation: Daughter Secondary Emergency Contact: [place] Mobile Phone: [phone] Relation: Son  Code Status: FULL Goals of care:  Advanced Directive information Advanced Directives 03/13/2017  Does Patient Have a Medical Advance Directive? No  Type of Advance Directive -  Does patient want to make changes to medical advance directive? -  Copy of [place] in Chart? -  Would patient like information on creating a medical advance directive? -     Allergies  Allergen Reactions  . Aspirin Swelling  . Atropine Other (See Comments)    Reaction: unknown  . Belladonna Alkaloids Other (See Comments)    Reaction: unknown  . Codeine Other (See Comments)    Reaction: unknown  . Darvon Other (See Comments)    Reaction: unknown  . Methocarbamol Other (See Comments)    Reaction: unknown  . Penicillins Other (See Comments)    Reaction: unknown Has patient had a PCN reaction causing immediate rash, facial/tongue/throat swelling, SOB or lightheadedness with hypotension: Unknown Has patient had a PCN reaction causing severe rash involving mucus membranes or skin necrosis: Unknown Has patient had a PCN reaction that required hospitalization: Unknown Has patient had a PCN reaction occurring within the last 10 years: Unknown If all of the above answers are "NO", then may proceed with Cephalosporin use.   . Sulfa Antibiotics Other (See Comments)   Reaction: unknown  . Iron Other (See Comments)    High dose prescription gives her diarrhea    Chief Complaint  Patient presents with  . Discharge Note    Discharged from SNF    HPI:  83 y.o. female seen today for discharge evaluation.  Patient was admitted to the facility following hospitalization for fall leading to closed right hip fracture with subsequent right knee arthroplasty.  Patient has been participating in PT and OT and has been progressing well.  Patient reports pain is well controlled on current regimen.  Patient reports appetite is good.  Having regular BMs and voiding without difficulty.  Patient had blood transfusion on 10/25 with 2 units packed red blood cells for hemoglobin of 6.1.  Patient underwent serial CBC after transfusion then was changed to weekly.  Hemoglobin today is 9.3.  Incision is well approximated.  Dressing CDI.  Patient reports she is feeling well has no complaints and is ready to go home.  Vital signs stable.    Past Medical History:  Diagnosis Date  . Asthma without status asthmaticus    unspecified  . CAD (coronary artery disease)   . Colitis    unspecified  . Diabetes mellitus   . Diabetes mellitus type 2, uncomplicated ([place])   . GERD (gastroesophageal reflux disease)   . 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 Hyperlipidemia    unspecified  . Hypertension   . Myocardial infarction ([place]) 2018  . Positive H. pylori test   . Viral gastroenteritis due to [place]-like agent Nov 2012    Past Surgical History:  Procedure Laterality Date  . ABDOMINAL  HYSTERECTOMY    . BLADDER SURGERY    . CARDIAC CATHETERIZATION    . CHOLECYSTECTOMY    . COLONOSCOPY     06/16/1987, 02/20/1995, 05/04/1999, 02/14/2005, 05/17/2007  . ESOPHAGOGASTRODUODENOSCOPY     05/11/1987, 06/13/1995, 10/09/1995, 05/04/1999, 01/16/2002      reports that  has never smoked. she has never used smokeless tobacco. She reports that she does not drink alcohol or use  drugs. Social History   Socioeconomic History  . Marital status: Married    Spouse name: [name]  . Number of children: 4  . Years of education: 69  . Highest education level: High school graduate  Social Needs  . Financial resource strain: Not on file  . Food insecurity - worry: Not on file  . Food insecurity - inability: Not on file  . Transportation needs - medical: Not on file  . Transportation needs - non-medical: Not on file  Occupational History  . Not on file  Tobacco Use  . Smoking status: Never Smoker  . Smokeless tobacco: Never Used  Substance and Sexual Activity  . Alcohol use: No  . Drug use: No  . Sexual activity: Not on file  Other Topics Concern  . Not on file  Social History Narrative   Married   4 children   Never smoker   Denies alcohol use   Full Code   Functional Status Survey:    Allergies  Allergen Reactions  . Aspirin Swelling  . Atropine Other (See Comments)    Reaction: unknown  . Belladonna Alkaloids Other (See Comments)    Reaction: unknown  . Codeine Other (See Comments)    Reaction: unknown  . Darvon Other (See Comments)    Reaction: unknown  . Methocarbamol Other (See Comments)    Reaction: unknown  . Penicillins Other (See Comments)    Reaction: unknown Has patient had a PCN reaction causing immediate rash, facial/tongue/throat swelling, SOB or lightheadedness with hypotension: Unknown Has patient had a PCN reaction causing severe rash involving mucus membranes or skin necrosis: Unknown Has patient had a PCN reaction that required hospitalization: Unknown Has patient had a PCN reaction occurring within the last 10 years: Unknown If all of the above answers are "NO", then may proceed with Cephalosporin use.   . Sulfa Antibiotics Other (See Comments)    Reaction: unknown  . Iron Other (See Comments)    High dose prescription gives her diarrhea    Pertinent  Health Maintenance Due  Topic Date Due  . DEXA SCAN  03/11/1990  .  OPHTHALMOLOGY EXAM  06/30/2015  . FOOT EXAM  12/28/2015  . INFLUENZA VACCINE  11/29/2016  . HEMOGLOBIN A1C  06/06/2017  . PNA vac Low Risk Adult  Completed    Medications: Allergies as of 03/13/2017      Reactions   Aspirin Swelling   Atropine Other (See Comments)   Reaction: unknown   Belladonna Alkaloids Other (See Comments)   Reaction: unknown   Codeine Other (See Comments)   Reaction: unknown   Darvon Other (See Comments)   Reaction: unknown   Methocarbamol Other (See Comments)   Reaction: unknown   Penicillins Other (See Comments)   Reaction: unknown Has patient had a PCN reaction causing immediate rash, facial/tongue/throat swelling, SOB or lightheadedness with hypotension: Unknown Has patient had a PCN reaction causing severe rash involving mucus membranes or skin necrosis: Unknown Has patient had a PCN reaction that required hospitalization: Unknown Has patient had a PCN reaction occurring within the last  10 years: Unknown If all of the above answers are "NO", then may proceed with Cephalosporin use.   Sulfa Antibiotics Other (See Comments)   Reaction: unknown   Iron Other (See Comments)   High dose prescription gives her diarrhea      Medication List        Accurate as of 03/13/17 12:23 PM. Always use your most recent med list.          amLODipine 2.5 MG tablet Commonly known as:  NORVASC Take 2.5 mg by mouth daily.   atorvastatin 40 MG tablet Commonly known as:  LIPITOR TAKE 1 TABLET BY MOUTH DAILY AT 6PM   BD MICROTAINER LANCETS Misc Contact  Activated lancet 1.8 mm x 21 g Use once daily to check blood sugars   bisacodyl 10 MG suppository Commonly known as:  DULCOLAX Place 10 mg rectally daily as needed.   Cholecalciferol 4000 units Caps Take 1 capsule by mouth daily.   clopidogrel 75 MG tablet Commonly known as:  PLAVIX Take 1 tablet (75 mg total) by mouth daily with breakfast.   fenofibrate 160 MG tablet TAKE 1 TABLET BY MOUTH DAILY FOR  HIGH TRIGLYCERIDES   glucose blood test strip Commonly known as:  BAYER CONTOUR TEST Use as instructed   HYDROcodone-acetaminophen 5-325 MG tablet Commonly known as:  NORCO/VICODIN Take 1-2 tablets by mouth every 6 (six) hours as needed for moderate pain. 1 tab for pain level 1-4 ; 2 tabs for pain level 5-10   hydrocortisone cream 1 % Apply 1 application 4 (four) times daily as needed topically for itching. Apply to area of rash on left leg   loratadine 10 MG tablet Commonly known as:  CLARITIN Take 10 mg daily by mouth.   losartan-hydrochlorothiazide 100-12.5 MG tablet Commonly known as:  HYZAAR Take 1 tablet by mouth daily.   metoprolol succinate 50 MG 24 hr tablet Commonly known as:  TOPROL-XL TAKE 1 TABLET BY MOUTH DAILY   nitroGLYCERIN 0.4 MG SL tablet Commonly known as:  NITROSTAT Place 1 tablet (0.4 mg total) under the tongue every 5 (five) minutes as needed for chest pain.   ondansetron 4 MG disintegrating tablet Commonly known as:  ZOFRAN-ODT Take 4 mg by mouth every 8 (eight) hours as needed for nausea or vomiting.   pantoprazole 40 MG tablet Commonly known as:  PROTONIX Take 40 mg 2 (two) times daily by mouth.   phenylephrine-shark liver oil-mineral oil-petrolatum 0.25-3-14-71.9 % rectal ointment Commonly known as:  PREPARATION H Place 1 application rectally as needed for hemorrhoids.   polyethylene glycol packet Commonly known as:  MIRALAX / GLYCOLAX Take 17 g by mouth every other day.   senna 8.6 MG Tabs tablet Commonly known as:  SENOKOT Take 2 tablets by mouth 2 (two) times daily as needed.   sertraline 50 MG tablet Commonly known as:  ZOLOFT Take 50 mg by mouth daily.   shark liver oil-cocoa butter 0.25-3-85.5 % suppository Commonly known as:  PREPARATION H Place 1 suppository rectally 3 (three) times daily. Until hemorrhoids resolve   TUMS 500 MG chewable tablet Generic drug:  calcium carbonate Chew 1 tablet by mouth 3 (three) times daily as  needed for heartburn.   vitamin C 500 MG tablet Commonly known as:  ASCORBIC ACID Take 500 mg by mouth 2 (two) times daily.       Review of Systems  Constitutional: Negative for activity change, appetite change, chills, diaphoresis and fever.  HENT: Negative for congestion, sneezing, sore throat,  trouble swallowing and voice change.   Respiratory: Negative for apnea, cough, choking, chest tightness, shortness of breath and wheezing.   Cardiovascular: Negative for chest pain, palpitations and leg swelling.  Gastrointestinal: Negative for abdominal distention, abdominal pain, constipation, diarrhea and nausea.  Genitourinary: Negative for difficulty urinating, dysuria, frequency and urgency.  Musculoskeletal: Positive for arthralgias (typical arthritis). Negative for back pain, gait problem and myalgias.  Skin: Negative for color change, pallor, rash and wound (healed).  Neurological: Negative for dizziness, tremors, syncope, speech difficulty, weakness, numbness and headaches.  Psychiatric/Behavioral: Negative for agitation and behavioral problems.  All other systems reviewed and are negative.   Vitals:   03/13/17 1200  BP: (!) 153/63  Pulse: 68  Resp: 18  Temp: 97.8 F (36.6 C)  TempSrc: Oral  SpO2: 97%  Weight: 121 lb 9.6 oz (55.2 kg)  Height: 5\' 3"  (1.6 m)   Body mass index is 21.54 kg/m. Physical Exam  Constitutional: She is oriented to person, place, and time. Vital signs are normal. She appears well-developed and well-nourished. She is active and cooperative. She does not appear ill. No distress.  HENT:  Head: Normocephalic and atraumatic.  Mouth/Throat: Uvula is midline, oropharynx is clear and moist and mucous membranes are normal. Mucous membranes are not pale, not dry and not cyanotic.  Eyes: Conjunctivae, EOM and lids are normal. Pupils are equal, round, and reactive to light.  Neck: Trachea normal, normal range of motion and full passive range of motion without  pain. Neck supple. No JVD present. No tracheal deviation, no edema and no erythema present. No thyromegaly present.  Cardiovascular: Normal rate, regular rhythm, intact distal pulses and normal pulses. Exam reveals no gallop and no distant heart sounds.  Murmur heard. Pulses:      Dorsalis pedis pulses are 2+ on the right side, and 2+ on the left side.  No edema  Pulmonary/Chest: Effort normal and breath sounds normal. No accessory muscle usage. No respiratory distress. She has no decreased breath sounds. She has no wheezes. She has no rhonchi. She has no rales. She exhibits no tenderness.  Abdominal: Soft. Normal appearance and bowel sounds are normal. She exhibits no distension and no ascites. There is no tenderness.  Musculoskeletal: She exhibits no edema or tenderness.       Right hip: She exhibits decreased range of motion and decreased strength. She exhibits no laceration (healed).  Expected osteoarthritis, stiffness.  Calves soft, supple.  Negative Homans sign  Neurological: She is alert and oriented to person, place, and time. She has normal strength.  Skin: Skin is warm and dry. She is not diaphoretic. No cyanosis. No pallor. Nails show no clubbing.  Right hip incision CDI, well approximated  Psychiatric: She has a normal mood and affect. Her speech is normal and behavior is normal. Judgment and thought content normal. Cognition and memory are normal.  Nursing note and vitals reviewed.   Labs reviewed: Basic Metabolic Panel: Recent Labs    11/01/16 0716  11/03/16 0338  02/22/17 0525 03/08/17 1428 03/13/17 0515  NA  --    < > 137   < > 137 136 138  K  --    < > 3.5   < > 3.3* 3.8 3.8  CL  --    < > 114*   < > 105 103 106  CO2  --    < > 18*   < > 23 21* 22  GLUCOSE  --    < > 107*   < >  132* 189* 125*  BUN  --    < > 19   < > 50* 50* 37*  CREATININE  --    < > 1.05*   < > 1.60* 1.38* 1.14*  CALCIUM  --    < > 8.3*   < > 8.3* 8.8* 9.2  MG 1.8  --  1.7  --   --   --   --     < > = values in this interval not displayed.   Liver Function Tests: Recent Labs    02/16/17 2000 02/22/17 0525 03/08/17 1428  AST 24 17 23   ALT 21 16 19   ALKPHOS 66 39 97  BILITOT 0.5 0.4 0.7  PROT 6.9 5.6* 6.5  ALBUMIN 4.0 2.5* 3.4*   Recent Labs    11/01/16 0547  LIPASE 43   No results for input(s): AMMONIA in the last 8760 hours. CBC: Recent Labs    03/01/17 1130 03/08/17 1428 03/13/17 0515  WBC 9.2 8.6 6.6  NEUTROABS 7.4* 6.5 4.1  HGB 9.7* 9.5* 9.3*  HCT 28.4* 28.6* 27.7*  MCV 84.9 85.3 85.4  PLT 499* 458* 293   Cardiac Enzymes: Recent Labs    08/30/16 1410  08/31/16 0552 08/31/16 1137 11/01/16 0716  CKTOTAL 192*  --   --   --  89  CKMB 16.8*  --   --   --   --   TROPONINI 5.79*   < > 2.35* 2.34* <0.03   < > = values in this interval not displayed.   BNP: Invalid input(s): POCBNP CBG: Recent Labs    02/19/17 0600 02/19/17 1205 02/22/17 1340  GLUCAP 134* 254* 225*    Procedures and Imaging Studies During Stay: Dg Pelvis Portable  Result Date: 02/17/2017 CLINICAL DATA:  Right hip replacement EXAM: PORTABLE PELVIS 1-2 VIEWS COMPARISON:  None. FINDINGS: Right hip arthroplasty without failure or complication. No hardware failure or complication. Postsurgical changes in the surrounding soft tissues. No fracture or dislocation.  Generalized osteopenia. IMPRESSION: Interval right hip arthroplasty without failure or complication. Electronically Signed   By: Kathreen Devoid   On: 02/17/2017 13:47   Dg Chest Port 1 View  Result Date: 02/16/2017 CLINICAL DATA:  Hip fracture preop EXAM: PORTABLE CHEST 1 VIEW COMPARISON:  11/02/2016 FINDINGS: Heart size upper normal. Vascularity normal. Prominent lung markings appear chronic. No focal infiltrate or effusion. IMPRESSION: No active disease. Electronically Signed   By: Franchot Gallo M.D.   On: 02/16/2017 20:53   Dg Hip Unilat  With Pelvis 2-3 Views Right  Result Date: 02/16/2017 CLINICAL DATA:  Right hip pain.   Fall EXAM: DG HIP (WITH OR WITHOUT PELVIS) 2-3V RIGHT COMPARISON:  None. FINDINGS: Right femoral neck fracture with angulation and displacement. Hip joint is normal. No other pelvic fracture IMPRESSION: Displaced fracture right femoral neck. Electronically Signed   By: Franchot Gallo M.D.   On: 02/16/2017 20:52    Assessment/Plan:   1.  Closed displaced fracture of right femoral neck  Continue PT/OT  Continue exercises as taught by PT/OT  Continue with Norco 5/325 mg 1-2 tablets p.o. every 6 hours as needed pain #30, no refill  Ice to hip as needed for pain or edema  Skin care per protocol  Follow-up with orthopedist as instructed for continuity of care  2.  Acute blood loss anemia  Continue Preparation H suppositories 3 times daily  Continue Preparation H per rectum as needed  Continue Protonix 40 mg p.o. twice daily  Continue  ferrous sulfate 325 mg p.o. daily  Continue vitamin C 500 mg p.o. twice daily for potentiation of iron absorption  Follow-up with PCP ASAP after discharge for continuity of care and monitoring of hemoglobin  Patient is being discharged with the following home health services: [facility]  Patient is being discharged with the following durable medical equipment: None  Patient has been advised to f/u with their PCP in 1-2 weeks to bring them up to date on their rehab stay.  Social services at facility was responsible for arranging this appointment.  Pt was provided with a 30 day supply of prescriptions for medications and refills must be obtained from their PCP.  For controlled substances, a more limited supply may be provided adequate until PCP appointment only.  Future labs/tests needed:    Family/ staff Communication:   Total Time:  Documentation:  Face to Face:  Family/Phone:  Vikki Ports, NP-C [facility] [address], [place] [address] Cell  Phone (Mon-Fri 8am-5pm):  [phone] On Call:  [phone] & follow prompts after 5pm & weekends Office Phone:  [phone] Office Fax:  [phone]

## 2017-03-16 ENCOUNTER — Telehealth: Payer: Self-pay | Admitting: Internal Medicine

## 2017-03-16 DIAGNOSIS — K219 Gastro-esophageal reflux disease without esophagitis: Secondary | ICD-10-CM | POA: Diagnosis not present

## 2017-03-16 DIAGNOSIS — S72091D Other fracture of head and neck of right femur, subsequent encounter for closed fracture with routine healing: Secondary | ICD-10-CM | POA: Diagnosis not present

## 2017-03-16 DIAGNOSIS — D500 Iron deficiency anemia secondary to blood loss (chronic): Secondary | ICD-10-CM

## 2017-03-16 DIAGNOSIS — Z7984 Long term (current) use of oral hypoglycemic drugs: Secondary | ICD-10-CM | POA: Diagnosis not present

## 2017-03-16 DIAGNOSIS — Z9181 History of falling: Secondary | ICD-10-CM | POA: Diagnosis not present

## 2017-03-16 DIAGNOSIS — Z96641 Presence of right artificial hip joint: Secondary | ICD-10-CM | POA: Diagnosis not present

## 2017-03-16 DIAGNOSIS — I252 Old myocardial infarction: Secondary | ICD-10-CM | POA: Diagnosis not present

## 2017-03-16 DIAGNOSIS — E1121 Type 2 diabetes mellitus with diabetic nephropathy: Secondary | ICD-10-CM

## 2017-03-16 DIAGNOSIS — I10 Essential (primary) hypertension: Secondary | ICD-10-CM | POA: Diagnosis not present

## 2017-03-16 DIAGNOSIS — J45909 Unspecified asthma, uncomplicated: Secondary | ICD-10-CM | POA: Diagnosis not present

## 2017-03-16 DIAGNOSIS — H9042 Sensorineural hearing loss, unilateral, left ear, with unrestricted hearing on the contralateral side: Secondary | ICD-10-CM | POA: Diagnosis not present

## 2017-03-16 DIAGNOSIS — E785 Hyperlipidemia, unspecified: Secondary | ICD-10-CM | POA: Diagnosis not present

## 2017-03-16 DIAGNOSIS — E119 Type 2 diabetes mellitus without complications: Secondary | ICD-10-CM | POA: Diagnosis not present

## 2017-03-16 DIAGNOSIS — I2510 Atherosclerotic heart disease of native coronary artery without angina pectoris: Secondary | ICD-10-CM | POA: Diagnosis not present

## 2017-03-16 NOTE — Telephone Encounter (Signed)
Ok to give verbals 

## 2017-03-16 NOTE — Telephone Encounter (Signed)
Copied from [place] [id number]. Topic: Quick Communication - See Telephone Encounter >> Mar 16, 2017 11:55 AM Burnis Medin, NT wrote: CRM for notification. See Telephone encounter for: Jeani Hawking from  [facility] is calling for verbal order for home health care for strengthening and gate training. Frequency is twice for 4 weeks and 1 a week for 2 weeks. Pt. Had an episode of low blood pressure and dizziness. It resolved when patient law downed. BP was 100/50. Call back number is [phone] office number [phone]  03/16/17.

## 2017-03-17 ENCOUNTER — Other Ambulatory Visit: Payer: Self-pay | Admitting: Internal Medicine

## 2017-03-17 MED ORDER — GLIPIZIDE 5 MG PO TABS: 5.0000 mg | ORAL_TABLET | Freq: Two times a day (BID) | ORAL | 3 refills | Status: DC

## 2017-03-17 NOTE — Telephone Encounter (Signed)
Yes ok to give verbal orders.     Medication changes made upon discharge to home from [place].  All have been discussed with patient and family.   Plavix suspended secondary to  persistent hematochezia  Amlodipine suspended due to hypotension resulting in near syncopal event while working with Home PT Metformin was discontinued during rehab sty due to diarrhea.  GLIPIZIDE 5 mg bid started at discharge. Ferrous sulfate suspended due to recurrent diarrhea which was occurring during rehab.  Trish,  Patient will need labs this week.  Please give Home health a verbal order to draw the(BMET, CBC, A1c )

## 2017-03-19 ENCOUNTER — Telehealth: Payer: Self-pay | Admitting: Internal Medicine

## 2017-03-19 NOTE — Telephone Encounter (Signed)
PN and LMA for wheelchair filled out and signed

## 2017-03-19 NOTE — Telephone Encounter (Signed)
Verbals given to PT. Labs scheduled in our office. Patients daughter is aware

## 2017-03-20 ENCOUNTER — Other Ambulatory Visit: Payer: Self-pay | Admitting: Internal Medicine

## 2017-03-20 DIAGNOSIS — S72091D Other fracture of head and neck of right femur, subsequent encounter for closed fracture with routine healing: Secondary | ICD-10-CM | POA: Diagnosis not present

## 2017-03-20 DIAGNOSIS — I10 Essential (primary) hypertension: Secondary | ICD-10-CM | POA: Diagnosis not present

## 2017-03-20 DIAGNOSIS — E119 Type 2 diabetes mellitus without complications: Secondary | ICD-10-CM | POA: Diagnosis not present

## 2017-03-20 DIAGNOSIS — R933 Abnormal findings on diagnostic imaging of other parts of digestive tract: Secondary | ICD-10-CM

## 2017-03-20 DIAGNOSIS — I2510 Atherosclerotic heart disease of native coronary artery without angina pectoris: Secondary | ICD-10-CM | POA: Diagnosis not present

## 2017-03-20 DIAGNOSIS — D500 Iron deficiency anemia secondary to blood loss (chronic): Secondary | ICD-10-CM

## 2017-03-20 DIAGNOSIS — K625 Hemorrhage of anus and rectum: Secondary | ICD-10-CM

## 2017-03-20 DIAGNOSIS — I252 Old myocardial infarction: Secondary | ICD-10-CM | POA: Diagnosis not present

## 2017-03-20 DIAGNOSIS — Z96641 Presence of right artificial hip joint: Secondary | ICD-10-CM | POA: Diagnosis not present

## 2017-03-20 NOTE — Telephone Encounter (Signed)
Forms have been faxed 

## 2017-03-20 NOTE — Assessment & Plan Note (Signed)
Rectal wall thickening  Has persisted by CT after resolution of infectious colitis.   Office exam with anuscopy by Dr Bary Castilla was normal in October .  She has had persistent hematochezia after her hip replacement and had a significant drop in hgb resulting in transfusion during rehab stay at [facility].  She will tolerate another anuscopy.  Referring to Dr. Vira Agar per request from [facility] [place],  Her POA.  There is the concern for rectal CA but given her age,  Treatment other than palliative would not be requested .  I have stopped her Plavix since she has no occlusive CAD.

## 2017-03-20 NOTE — Assessment & Plan Note (Signed)
S/p transfusion for hgb < 8 post hip replacemenet Nov 2018 .  She has not tolerated oral iron supplements  due to persistent diarrhea complicated by recurrent hematochezia.  Rechecking hgb on Nov 21 . She may benefit from an iron  Transfusion if her hgb has not improved.   Lab Results  Component Value Date   WBC 6.6 03/13/2017   HGB 9.3 (L) 03/13/2017   HCT 27.7 (L) 03/13/2017   MCV 85.4 03/13/2017   PLT 293 03/13/2017

## 2017-03-21 ENCOUNTER — Telehealth: Payer: Self-pay

## 2017-03-21 ENCOUNTER — Other Ambulatory Visit (INDEPENDENT_AMBULATORY_CARE_PROVIDER_SITE_OTHER): Payer: Medicare Other

## 2017-03-21 DIAGNOSIS — I2510 Atherosclerotic heart disease of native coronary artery without angina pectoris: Secondary | ICD-10-CM | POA: Diagnosis not present

## 2017-03-21 DIAGNOSIS — D500 Iron deficiency anemia secondary to blood loss (chronic): Secondary | ICD-10-CM

## 2017-03-21 DIAGNOSIS — I252 Old myocardial infarction: Secondary | ICD-10-CM | POA: Diagnosis not present

## 2017-03-21 DIAGNOSIS — Z96641 Presence of right artificial hip joint: Secondary | ICD-10-CM | POA: Diagnosis not present

## 2017-03-21 DIAGNOSIS — E119 Type 2 diabetes mellitus without complications: Secondary | ICD-10-CM | POA: Diagnosis not present

## 2017-03-21 DIAGNOSIS — E1121 Type 2 diabetes mellitus with diabetic nephropathy: Secondary | ICD-10-CM

## 2017-03-21 DIAGNOSIS — I10 Essential (primary) hypertension: Secondary | ICD-10-CM | POA: Diagnosis not present

## 2017-03-21 DIAGNOSIS — S72091D Other fracture of head and neck of right femur, subsequent encounter for closed fracture with routine healing: Secondary | ICD-10-CM | POA: Diagnosis not present

## 2017-03-21 LAB — CBC WITH DIFFERENTIAL/PLATELET
BASOS ABS: 0.1 10*3/uL (ref 0.0–0.1)
Basophils Relative: 0.7 % (ref 0.0–3.0)
EOS ABS: 0.3 10*3/uL (ref 0.0–0.7)
Eosinophils Relative: 3.7 % (ref 0.0–5.0)
HEMATOCRIT: 32.3 % — AB (ref 36.0–46.0)
Hemoglobin: 10.6 g/dL — ABNORMAL LOW (ref 12.0–15.0)
LYMPHS PCT: 19.7 % (ref 12.0–46.0)
Lymphs Abs: 1.7 10*3/uL (ref 0.7–4.0)
MCHC: 32.7 g/dL (ref 30.0–36.0)
MCV: 86.8 fl (ref 78.0–100.0)
MONO ABS: 0.7 10*3/uL (ref 0.1–1.0)
Monocytes Relative: 7.7 % (ref 3.0–12.0)
NEUTROS ABS: 5.9 10*3/uL (ref 1.4–7.7)
Neutrophils Relative %: 68.2 % (ref 43.0–77.0)
Platelets: 317 10*3/uL (ref 150.0–400.0)
RBC: 3.72 Mil/uL — ABNORMAL LOW (ref 3.87–5.11)
RDW: 14.7 % (ref 11.5–15.5)
WBC: 8.6 10*3/uL (ref 4.0–10.5)

## 2017-03-21 LAB — BASIC METABOLIC PANEL WITH GFR
BUN: 50 mg/dL — ABNORMAL HIGH (ref 6–23)
CO2: 23 meq/L (ref 19–32)
Calcium: 9.6 mg/dL (ref 8.4–10.5)
Chloride: 108 meq/L (ref 96–112)
Creatinine, Ser: 1.26 mg/dL — ABNORMAL HIGH (ref 0.40–1.20)
GFR: 42.21 mL/min — ABNORMAL LOW
Glucose, Bld: 100 mg/dL — ABNORMAL HIGH (ref 70–99)
Potassium: 3.5 meq/L (ref 3.5–5.1)
Sodium: 141 meq/L (ref 135–145)

## 2017-03-21 LAB — HEMOGLOBIN A1C: HEMOGLOBIN A1C: 6.9 % — AB (ref 4.6–6.5)

## 2017-03-21 NOTE — Telephone Encounter (Signed)
Verbals given  Copied from [place] [id number]. Topic: Inquiry >> Mar 21, 2017  2:50 PM Oliver Pila B wrote: Reason for CRM: ESTHER from [facility] is needing verbal orders for 2x a week for one week and then once a week for one week contact esther @ [phone]

## 2017-03-26 DIAGNOSIS — S72091D Other fracture of head and neck of right femur, subsequent encounter for closed fracture with routine healing: Secondary | ICD-10-CM | POA: Diagnosis not present

## 2017-03-26 DIAGNOSIS — E119 Type 2 diabetes mellitus without complications: Secondary | ICD-10-CM | POA: Diagnosis not present

## 2017-03-26 DIAGNOSIS — I2510 Atherosclerotic heart disease of native coronary artery without angina pectoris: Secondary | ICD-10-CM | POA: Diagnosis not present

## 2017-03-26 DIAGNOSIS — I10 Essential (primary) hypertension: Secondary | ICD-10-CM | POA: Diagnosis not present

## 2017-03-26 DIAGNOSIS — Z96641 Presence of right artificial hip joint: Secondary | ICD-10-CM | POA: Diagnosis not present

## 2017-03-26 DIAGNOSIS — I252 Old myocardial infarction: Secondary | ICD-10-CM | POA: Diagnosis not present

## 2017-03-27 DIAGNOSIS — I10 Essential (primary) hypertension: Secondary | ICD-10-CM | POA: Diagnosis not present

## 2017-03-27 DIAGNOSIS — I2510 Atherosclerotic heart disease of native coronary artery without angina pectoris: Secondary | ICD-10-CM | POA: Diagnosis not present

## 2017-03-27 DIAGNOSIS — Z96641 Presence of right artificial hip joint: Secondary | ICD-10-CM | POA: Diagnosis not present

## 2017-03-27 DIAGNOSIS — I252 Old myocardial infarction: Secondary | ICD-10-CM | POA: Diagnosis not present

## 2017-03-27 DIAGNOSIS — E119 Type 2 diabetes mellitus without complications: Secondary | ICD-10-CM | POA: Diagnosis not present

## 2017-03-27 DIAGNOSIS — S72091D Other fracture of head and neck of right femur, subsequent encounter for closed fracture with routine healing: Secondary | ICD-10-CM | POA: Diagnosis not present

## 2017-03-28 DIAGNOSIS — Z96641 Presence of right artificial hip joint: Secondary | ICD-10-CM | POA: Diagnosis not present

## 2017-03-28 DIAGNOSIS — E119 Type 2 diabetes mellitus without complications: Secondary | ICD-10-CM | POA: Diagnosis not present

## 2017-03-28 DIAGNOSIS — S72091D Other fracture of head and neck of right femur, subsequent encounter for closed fracture with routine healing: Secondary | ICD-10-CM | POA: Diagnosis not present

## 2017-03-28 DIAGNOSIS — I10 Essential (primary) hypertension: Secondary | ICD-10-CM | POA: Diagnosis not present

## 2017-03-28 DIAGNOSIS — I2510 Atherosclerotic heart disease of native coronary artery without angina pectoris: Secondary | ICD-10-CM | POA: Diagnosis not present

## 2017-03-28 DIAGNOSIS — I252 Old myocardial infarction: Secondary | ICD-10-CM | POA: Diagnosis not present

## 2017-03-29 ENCOUNTER — Other Ambulatory Visit (INDEPENDENT_AMBULATORY_CARE_PROVIDER_SITE_OTHER): Payer: Medicare Other

## 2017-03-29 ENCOUNTER — Telehealth: Payer: Self-pay | Admitting: Internal Medicine

## 2017-03-29 ENCOUNTER — Other Ambulatory Visit: Payer: Self-pay | Admitting: Internal Medicine

## 2017-03-29 DIAGNOSIS — R6889 Other general symptoms and signs: Secondary | ICD-10-CM

## 2017-03-29 DIAGNOSIS — I2510 Atherosclerotic heart disease of native coronary artery without angina pectoris: Secondary | ICD-10-CM | POA: Diagnosis not present

## 2017-03-29 DIAGNOSIS — I10 Essential (primary) hypertension: Secondary | ICD-10-CM | POA: Diagnosis not present

## 2017-03-29 DIAGNOSIS — Z96641 Presence of right artificial hip joint: Secondary | ICD-10-CM | POA: Diagnosis not present

## 2017-03-29 DIAGNOSIS — I252 Old myocardial infarction: Secondary | ICD-10-CM | POA: Diagnosis not present

## 2017-03-29 DIAGNOSIS — S72091D Other fracture of head and neck of right femur, subsequent encounter for closed fracture with routine healing: Secondary | ICD-10-CM | POA: Diagnosis not present

## 2017-03-29 DIAGNOSIS — E119 Type 2 diabetes mellitus without complications: Secondary | ICD-10-CM | POA: Diagnosis not present

## 2017-03-29 LAB — URINALYSIS, ROUTINE W REFLEX MICROSCOPIC
Bilirubin Urine: NEGATIVE
Hgb urine dipstick: NEGATIVE
Ketones, ur: NEGATIVE
Leukocytes, UA: NEGATIVE
Nitrite: NEGATIVE
PH: 5 (ref 5.0–8.0)
RBC / HPF: NONE SEEN (ref 0–?)
SPECIFIC GRAVITY, URINE: 1.015 (ref 1.000–1.030)
Total Protein, Urine: NEGATIVE
URINE GLUCOSE: NEGATIVE
Urobilinogen, UA: 0.2 (ref 0.0–1.0)
WBC, UA: NONE SEEN (ref 0–?)

## 2017-03-29 NOTE — Telephone Encounter (Signed)
Family will be dropping off a urine specimen today to run.  Labs ordered (urine with micro and culture)

## 2017-03-30 ENCOUNTER — Other Ambulatory Visit: Payer: Medicare Other

## 2017-03-30 LAB — URINE CULTURE
MICRO NUMBER: 81342176
SPECIMEN QUALITY: ADEQUATE

## 2017-04-02 ENCOUNTER — Ambulatory Visit: Admit: 2017-04-02 | Payer: Medicare Other | Admitting: Unknown Physician Specialty

## 2017-04-02 DIAGNOSIS — Z96649 Presence of unspecified artificial hip joint: Secondary | ICD-10-CM | POA: Diagnosis not present

## 2017-04-02 SURGERY — SIGMOIDOSCOPY, FLEXIBLE

## 2017-04-03 DIAGNOSIS — I252 Old myocardial infarction: Secondary | ICD-10-CM | POA: Diagnosis not present

## 2017-04-03 DIAGNOSIS — E119 Type 2 diabetes mellitus without complications: Secondary | ICD-10-CM | POA: Diagnosis not present

## 2017-04-03 DIAGNOSIS — S72091D Other fracture of head and neck of right femur, subsequent encounter for closed fracture with routine healing: Secondary | ICD-10-CM | POA: Diagnosis not present

## 2017-04-03 DIAGNOSIS — Z96641 Presence of right artificial hip joint: Secondary | ICD-10-CM | POA: Diagnosis not present

## 2017-04-03 DIAGNOSIS — I10 Essential (primary) hypertension: Secondary | ICD-10-CM | POA: Diagnosis not present

## 2017-04-03 DIAGNOSIS — I2510 Atherosclerotic heart disease of native coronary artery without angina pectoris: Secondary | ICD-10-CM | POA: Diagnosis not present

## 2017-04-05 ENCOUNTER — Other Ambulatory Visit: Payer: Self-pay | Admitting: Internal Medicine

## 2017-04-05 DIAGNOSIS — E119 Type 2 diabetes mellitus without complications: Secondary | ICD-10-CM | POA: Diagnosis not present

## 2017-04-05 DIAGNOSIS — I2510 Atherosclerotic heart disease of native coronary artery without angina pectoris: Secondary | ICD-10-CM | POA: Diagnosis not present

## 2017-04-05 DIAGNOSIS — Z96641 Presence of right artificial hip joint: Secondary | ICD-10-CM | POA: Diagnosis not present

## 2017-04-05 DIAGNOSIS — I10 Essential (primary) hypertension: Secondary | ICD-10-CM | POA: Diagnosis not present

## 2017-04-05 DIAGNOSIS — I252 Old myocardial infarction: Secondary | ICD-10-CM | POA: Diagnosis not present

## 2017-04-05 DIAGNOSIS — S72091D Other fracture of head and neck of right femur, subsequent encounter for closed fracture with routine healing: Secondary | ICD-10-CM | POA: Diagnosis not present

## 2017-04-10 DIAGNOSIS — I10 Essential (primary) hypertension: Secondary | ICD-10-CM | POA: Diagnosis not present

## 2017-04-10 DIAGNOSIS — I252 Old myocardial infarction: Secondary | ICD-10-CM | POA: Diagnosis not present

## 2017-04-10 DIAGNOSIS — E119 Type 2 diabetes mellitus without complications: Secondary | ICD-10-CM | POA: Diagnosis not present

## 2017-04-10 DIAGNOSIS — S72091D Other fracture of head and neck of right femur, subsequent encounter for closed fracture with routine healing: Secondary | ICD-10-CM | POA: Diagnosis not present

## 2017-04-10 DIAGNOSIS — Z96641 Presence of right artificial hip joint: Secondary | ICD-10-CM | POA: Diagnosis not present

## 2017-04-10 DIAGNOSIS — I2510 Atherosclerotic heart disease of native coronary artery without angina pectoris: Secondary | ICD-10-CM | POA: Diagnosis not present

## 2017-04-11 ENCOUNTER — Ambulatory Visit: Admit: 2017-04-11 | Payer: Medicare Other | Admitting: Unknown Physician Specialty

## 2017-04-11 SURGERY — SIGMOIDOSCOPY, FLEXIBLE

## 2017-04-12 DIAGNOSIS — E119 Type 2 diabetes mellitus without complications: Secondary | ICD-10-CM | POA: Diagnosis not present

## 2017-04-12 DIAGNOSIS — I2510 Atherosclerotic heart disease of native coronary artery without angina pectoris: Secondary | ICD-10-CM | POA: Diagnosis not present

## 2017-04-12 DIAGNOSIS — I10 Essential (primary) hypertension: Secondary | ICD-10-CM | POA: Diagnosis not present

## 2017-04-12 DIAGNOSIS — Z96641 Presence of right artificial hip joint: Secondary | ICD-10-CM | POA: Diagnosis not present

## 2017-04-12 DIAGNOSIS — I252 Old myocardial infarction: Secondary | ICD-10-CM | POA: Diagnosis not present

## 2017-04-12 DIAGNOSIS — S72091D Other fracture of head and neck of right femur, subsequent encounter for closed fracture with routine healing: Secondary | ICD-10-CM | POA: Diagnosis not present

## 2017-04-13 ENCOUNTER — Telehealth: Payer: Self-pay | Admitting: Internal Medicine

## 2017-04-13 ENCOUNTER — Other Ambulatory Visit: Payer: Self-pay | Admitting: Internal Medicine

## 2017-04-13 DIAGNOSIS — E1121 Type 2 diabetes mellitus with diabetic nephropathy: Secondary | ICD-10-CM

## 2017-04-13 DIAGNOSIS — K922 Gastrointestinal hemorrhage, unspecified: Secondary | ICD-10-CM

## 2017-04-13 NOTE — Telephone Encounter (Signed)
Please call and schedule patient

## 2017-04-13 NOTE — Telephone Encounter (Signed)
Please call daughter [name] to set up lab appt for CBC no fasting required

## 2017-04-16 ENCOUNTER — Other Ambulatory Visit: Payer: Self-pay | Admitting: Internal Medicine

## 2017-04-16 DIAGNOSIS — I252 Old myocardial infarction: Secondary | ICD-10-CM | POA: Diagnosis not present

## 2017-04-16 DIAGNOSIS — I10 Essential (primary) hypertension: Secondary | ICD-10-CM | POA: Diagnosis not present

## 2017-04-16 DIAGNOSIS — S72091D Other fracture of head and neck of right femur, subsequent encounter for closed fracture with routine healing: Secondary | ICD-10-CM | POA: Diagnosis not present

## 2017-04-16 DIAGNOSIS — I2510 Atherosclerotic heart disease of native coronary artery without angina pectoris: Secondary | ICD-10-CM | POA: Diagnosis not present

## 2017-04-16 DIAGNOSIS — E119 Type 2 diabetes mellitus without complications: Secondary | ICD-10-CM | POA: Diagnosis not present

## 2017-04-16 DIAGNOSIS — Z96641 Presence of right artificial hip joint: Secondary | ICD-10-CM | POA: Diagnosis not present

## 2017-04-16 NOTE — Telephone Encounter (Signed)
Looks like this medication was dc'd by a senior care facility due to change in therapy.   Refilled: 11/14/2016 Last OV: 11/16/2016 Next OV: not scheduled

## 2017-04-17 ENCOUNTER — Other Ambulatory Visit (INDEPENDENT_AMBULATORY_CARE_PROVIDER_SITE_OTHER): Payer: Medicare Other

## 2017-04-17 DIAGNOSIS — E1121 Type 2 diabetes mellitus with diabetic nephropathy: Secondary | ICD-10-CM

## 2017-04-17 DIAGNOSIS — K922 Gastrointestinal hemorrhage, unspecified: Secondary | ICD-10-CM

## 2017-04-18 ENCOUNTER — Other Ambulatory Visit: Payer: Self-pay | Admitting: Internal Medicine

## 2017-04-18 DIAGNOSIS — D500 Iron deficiency anemia secondary to blood loss (chronic): Secondary | ICD-10-CM

## 2017-04-18 LAB — IRON,TIBC AND FERRITIN PANEL
%SAT: 13 % (ref 11–50)
FERRITIN: 282 ng/mL (ref 20–288)
IRON: 45 ug/dL (ref 45–160)
TIBC: 353 ug/dL (ref 250–450)

## 2017-04-18 LAB — BASIC METABOLIC PANEL
BUN/Creatinine Ratio: 34 (calc) — ABNORMAL HIGH (ref 6–22)
BUN: 39 mg/dL — ABNORMAL HIGH (ref 7–25)
CALCIUM: 9 mg/dL (ref 8.6–10.4)
CHLORIDE: 108 mmol/L (ref 98–110)
CO2: 22 mmol/L (ref 20–32)
Creat: 1.16 mg/dL — ABNORMAL HIGH (ref 0.60–0.88)
Glucose, Bld: 228 mg/dL — ABNORMAL HIGH (ref 65–99)
Potassium: 3.7 mmol/L (ref 3.5–5.3)
Sodium: 139 mmol/L (ref 135–146)

## 2017-04-18 LAB — CBC WITH DIFFERENTIAL/PLATELET
BASOS PCT: 0.5 %
Basophils Absolute: 37 cells/uL (ref 0–200)
EOS PCT: 2.9 %
Eosinophils Absolute: 215 cells/uL (ref 15–500)
HCT: 26.5 % — ABNORMAL LOW (ref 35.0–45.0)
Hemoglobin: 8.5 g/dL — ABNORMAL LOW (ref 11.7–15.5)
Lymphs Abs: 1480 cells/uL (ref 850–3900)
MCH: 26.9 pg — ABNORMAL LOW (ref 27.0–33.0)
MCHC: 32.1 g/dL (ref 32.0–36.0)
MCV: 83.9 fL (ref 80.0–100.0)
MONOS PCT: 7.9 %
MPV: 10.3 fL (ref 7.5–12.5)
NEUTROS PCT: 68.7 %
Neutro Abs: 5084 cells/uL (ref 1500–7800)
Platelets: 296 10*3/uL (ref 140–400)
RBC: 3.16 10*6/uL — AB (ref 3.80–5.10)
RDW: 13.3 % (ref 11.0–15.0)
TOTAL LYMPHOCYTE: 20 %
WBC mixed population: 585 cells/uL (ref 200–950)
WBC: 7.4 10*3/uL (ref 3.8–10.8)

## 2017-04-18 MED ORDER — FERROUS FUM-IRON POLYSACCH 162-115.2 MG PO CAPS: 1.0000 | ORAL_CAPSULE | ORAL | 2 refills | Status: DC

## 2017-04-18 NOTE — Progress Notes (Signed)
amb re 

## 2017-04-19 ENCOUNTER — Ambulatory Visit
Admission: RE | Admit: 2017-04-19 | Discharge: 2017-04-19 | Disposition: A | Discharge status: Home / Self Care | Payer: Medicare Other | Source: Ambulatory Visit | Attending: Internal Medicine | Admitting: Internal Medicine

## 2017-04-19 DIAGNOSIS — K922 Gastrointestinal hemorrhage, unspecified: Secondary | ICD-10-CM | POA: Insufficient documentation

## 2017-04-19 LAB — PREPARE RBC (CROSSMATCH)

## 2017-04-19 MED ORDER — SODIUM CHLORIDE 0.9 % IV SOLN: Freq: Once | INTRAVENOUS | Status: DC

## 2017-04-19 MED ORDER — SODIUM CHLORIDE 0.9 % IV SOLN
INTRAVENOUS | Status: DC
  Administered 2017-04-19: 08:00:00 via INTRAVENOUS

## 2017-04-20 ENCOUNTER — Ambulatory Visit (INDEPENDENT_AMBULATORY_CARE_PROVIDER_SITE_OTHER): Payer: Medicare Other | Admitting: Cardiovascular Disease

## 2017-04-20 VITALS — BP 120/52 | HR 75 | Ht 60.0 in | Wt 124.0 lb

## 2017-04-20 DIAGNOSIS — I2510 Atherosclerotic heart disease of native coronary artery without angina pectoris: Secondary | ICD-10-CM

## 2017-04-20 DIAGNOSIS — I10 Essential (primary) hypertension: Secondary | ICD-10-CM

## 2017-04-20 DIAGNOSIS — I350 Nonrheumatic aortic (valve) stenosis: Secondary | ICD-10-CM

## 2017-04-20 DIAGNOSIS — I2584 Coronary atherosclerosis due to calcified coronary lesion: Secondary | ICD-10-CM | POA: Diagnosis not present

## 2017-04-20 DIAGNOSIS — E7849 Other hyperlipidemia: Secondary | ICD-10-CM

## 2017-04-20 LAB — TYPE AND SCREEN
ABO/RH(D): O NEG
Antibody Screen: NEGATIVE
Unit division: 0

## 2017-04-20 LAB — BPAM RBC
Blood Product Expiration Date: 201812302359
ISSUE DATE / TIME: 201812200959
Unit Type and Rh: 9500

## 2017-04-20 NOTE — Patient Instructions (Addendum)
Medication Instructions:  Your physician recommends that you continue on your current medications as directed. Please refer to the Current Medication list given to you today.   Labwork: none  Testing/Procedures: Your physician has requested that you have an echocardiogram in 6 months. Echocardiography is a painless test that uses sound waves to create images of your heart. It provides your doctor with information about the size and shape of your heart and how well your heart's chambers and valves are working. This procedure takes approximately one hour. There are no restrictions for this procedure.    Follow-Up: Your physician wants you to follow-up in: 6 months with Dr. Fletcher Anon.  You will receive a reminder letter in the mail two months in advance. If you don't receive a letter, please call our office to schedule the follow-up appointment.   Any Other Special Instructions Will Be Listed Below (If Applicable). You may schedule your echo and follow up visit with Dr. Fletcher Anon on the same day.      If you need a refill on your cardiac medications before your next appointment, please call your pharmacy.  Echocardiogram An echocardiogram, or echocardiography, uses sound waves (ultrasound) to produce an image of your heart. The echocardiogram is simple, painless, obtained within a short period of time, and offers valuable information to your health care provider. The images from an echocardiogram can provide information such as:  Evidence of coronary artery disease (CAD).  Heart size.  Heart muscle function.  Heart valve function.  Aneurysm detection.  Evidence of a past heart attack.  Fluid buildup around the heart.  Heart muscle thickening.  Assess heart valve function.  Tell a health care provider about:  Any allergies you have.  All medicines you are taking, including vitamins, herbs, eye drops, creams, and over-the-counter medicines.  Any problems you or family members  have had with anesthetic medicines.  Any blood disorders you have.  Any surgeries you have had.  Any medical conditions you have.  Whether you are pregnant or may be pregnant. What happens before the procedure? No special preparation is needed. Eat and drink normally. What happens during the procedure?  In order to produce an image of your heart, gel will be applied to your chest and a wand-like tool (transducer) will be moved over your chest. The gel will help transmit the sound waves from the transducer. The sound waves will harmlessly bounce off your heart to allow the heart images to be captured in real-time motion. These images will then be recorded.  You may need an IV to receive a medicine that improves the quality of the pictures. What happens after the procedure? You may return to your normal schedule including diet, activities, and medicines, unless your health care provider tells you otherwise. This information is not intended to replace advice given to you by your health care provider. Make sure you discuss any questions you have with your health care provider. Document Released: 04/14/2000 Document Revised: 12/04/2015 Document Reviewed: 12/23/2012 Elsevier Interactive Patient Education  2017 [employer].

## 2017-04-20 NOTE — Progress Notes (Signed)
Cardiology Office Note   Date:  04/20/2017   ID:  [name], DOB 25-Nov-1922, MRN [id number]  PCP:  [name], MD  Cardiologist:   [name], MD   Chief Complaint  Patient presents with  . Other    3 month follow up. patient c/o swelling and redness in ankles. Meds reviewed verbally with patient.       History of Present Illness: Maureen Morales is a 83 y.o. female who presents for a follow up regarding aortic stenosis coronary artery disease. She has chronic medical conditions that include hypertension, hyperlipidemia and type 2 diabetes.  She was hospitalized In May of 2018 with non-ST elevation myocardial infarction in the setting of uncontrolled hypertension. Troponin peaked at 5.79. Echocardiogram showed hyperdynamic LV systolic function, mild aortic stenosis with mean gradient of 14 mmHg, mild mitral regurgitation, mild pulmonary hypertension and trivial posterior pericardial effusion. Cardiac catheterization showed mild to moderate nonobstructive coronary artery disease. Worst stenosis was 60% in the midright coronary artery. She was placed on Plavix and atorvastatin. Outpatient renal artery duplex showed no evidence of renal artery stenosis.  During last visit, I decreased amlodipine and added small dose hydrochlorothiazide due to leg edema and uncontrolled hypertension. She fell in October and fractured her right hip.  She underwent surgical repair.  She was discharged to rehab.  She had persistent hematochezia and thus clopidogrel was stopped.  Amlodipine and hydrochlorothiazide were stopped due to hypotension.  She had blood transfusion yesterday for persistent anemia.  She is supposed to get sigmoidoscopy in early January. She denies chest pain or shortness of breath.  She only complains of fatigue.  Past Medical History:  Diagnosis Date  . Asthma without status asthmaticus    unspecified  . CAD (coronary artery disease)   . Colitis    unspecified  .  Diabetes mellitus   . Diabetes mellitus type 2, uncomplicated ([place])   . GERD (gastroesophageal reflux disease)   . 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 Hyperlipidemia    unspecified  . Hypertension   . Myocardial infarction ([employer]) 2018  . Positive H. pylori test   . Viral gastroenteritis due to [place]-like agent Nov 2012    Past Surgical History:  Procedure Laterality Date  . ABDOMINAL HYSTERECTOMY    . BLADDER SURGERY    . CARDIAC CATHETERIZATION    . CHOLECYSTECTOMY    . COLONOSCOPY     06/16/1987, 02/20/1995, 05/04/1999, 02/14/2005, 05/17/2007  . ESOPHAGOGASTRODUODENOSCOPY     05/11/1987, 06/13/1995, 10/09/1995, 05/04/1999, 01/16/2002  . HIP ARTHROPLASTY Right 02/17/2017   Procedure: ARTHROPLASTY BIPOLAR HIP (HEMIARTHROPLASTY);  Surgeon: Claud Kelp, MD;  Location: [facility] ORS;  Service: Orthopedics;  Laterality: Right;  . LEFT HEART CATH AND CORONARY ANGIOGRAPHY N/A 08/31/2016   Procedure: Left Heart Cath and Coronary Angiography;  Surgeon: Wellington Hampshire, MD;  Location: [place] [facility];  Service: Cardiovascular;  Laterality: N/A;     Current Outpatient Medications  Medication Sig Dispense Refill  . atorvastatin (LIPITOR) 40 MG tablet TAKE 1 TABLET BY MOUTH DAILY 30 tablet 3  . fenofibrate 160 MG tablet TAKE 1 TABLET BY MOUTH DAILY FOR HIGH TRIGLYCERIDES 30 tablet 3  . glipiZIDE (GLUCOTROL) 5 MG tablet Take 2.5 mg by mouth 2 (two) times daily before a meal.    . glucose blood (BAYER CONTOUR TEST) test strip Use as instructed 100 each 5  . loratadine (CLARITIN) 10 MG tablet Take 10 mg daily  by mouth.     . losartan (COZAAR) 100 MG tablet Take 100 mg by mouth daily.    . metoprolol succinate (TOPROL-XL) 50 MG 24 hr tablet TAKE 1 TABLET BY MOUTH DAILY 30 tablet 3  . nitroGLYCERIN (NITROSTAT) 0.4 MG SL tablet Place 1 tablet (0.4 mg total) under the tongue every 5 (five) minutes as needed for chest pain. 10 tablet 0  . sertraline (ZOLOFT) 50 MG tablet Take 50 mg by  mouth daily.    . vitamin C (ASCORBIC ACID) 500 MG tablet Take 500 mg by mouth 2 (two) times daily.     No current facility-administered medications for this visit.     Allergies:   Aspirin; Atropine; Belladonna alkaloids; Codeine; Darvon; Methocarbamol; Penicillins; Sulfa antibiotics; and Iron    Social History:  The patient  reports that  has never smoked. she has never used smokeless tobacco. She reports that she does not drink alcohol or use drugs.   Family History:  The patient's family history includes Breast cancer (age of onset: 80) in her sister; Breast cancer (age of onset: 25) in her mother; Cancer in her mother; Colon cancer in her brother, brother, and father.    ROS:  Please see the history of present illness.   Otherwise, review of systems are positive for none.   All other systems are reviewed and negative.    PHYSICAL EXAM: VS:  BP (!) 120/52 (BP Location: Left Arm, Patient Position: Sitting, Cuff Size: Normal)   Pulse 75   Ht 5' (1.524 m)   Wt 124 lb (56.2 kg)   BMI 24.22 kg/m  , BMI Body mass index is 24.22 kg/m. GEN: Well nourished, well developed, in no acute distress  HEENT: normal  Neck: no JVD, carotid bruits, or masses Cardiac: RRR; no rubs, or gallops, mild bilateral leg edema . 3/6 crescendo decrescendo systolic murmur in the aortic area which is mid peaking Respiratory:  clear to auscultation bilaterally, normal work of breathing GI: soft, nontender, nondistended, + BS MS: no deformity or atrophy  Skin: warm and dry, no rash Neuro:  Strength and sensation are intact Psych: euthymic mood, full affect    EKG:  EKG is ordered today. The ekg ordered today demonstrates normal sinus rhythm with possible left atrial enlargement.   Recent Labs: 08/30/2016: B Natriuretic Peptide 203.0 11/03/2016: Magnesium 1.7 03/08/2017: ALT 19 04/17/2017: BUN 39; Creat 1.16; Hemoglobin 8.5; Platelets 296; Potassium 3.7; Sodium 139    Lipid Panel    Component Value  Date/Time   CHOL 132 12/04/2016 0808   TRIG 128.0 12/04/2016 0808   HDL 38.30 (L) 12/04/2016 0808   CHOLHDL 3 12/04/2016 0808   VLDL 25.6 12/04/2016 0808   LDLCALC 68 12/04/2016 0808   LDLDIRECT 92.0 07/06/2016 1059      Wt Readings from Last 3 Encounters:  04/20/17 124 lb (56.2 kg)  03/13/17 121 lb 9.6 oz (55.2 kg)  03/08/17 121 lb 9.6 oz (55.2 kg)       No flowsheet data found.    ASSESSMENT AND PLAN:  1. Coronary artery disease involving native coronary arteries without angina: I agree with stopping Plavix given blood loss anemia.  Currently with no anginal symptoms.  Continue treatment of risk factors.  2. Mild aortic valve stenosis: This has been stable on echocardiogram. Repeat echocardiogram in 6 months from now.  3. Essential hypertension: Blood pressure is well controlled on current medications.  4. Hyperlipidemia: Continue treatment with atorvastatin. Most recent lipid profile showed an  LDL of 68.  5.  Chronic venous insufficiency: I suggested using knee-high support stockings she reports being allergic to some of its component.    Disposition:   FU with me in 6 months.  Signed,  [name], MD  04/20/2017 3:26 PM    [place]

## 2017-04-25 NOTE — Telephone Encounter (Signed)
signed

## 2017-04-27 DIAGNOSIS — E119 Type 2 diabetes mellitus without complications: Secondary | ICD-10-CM | POA: Diagnosis not present

## 2017-04-27 DIAGNOSIS — I10 Essential (primary) hypertension: Secondary | ICD-10-CM | POA: Diagnosis not present

## 2017-04-27 DIAGNOSIS — S72091D Other fracture of head and neck of right femur, subsequent encounter for closed fracture with routine healing: Secondary | ICD-10-CM | POA: Diagnosis not present

## 2017-04-27 DIAGNOSIS — I252 Old myocardial infarction: Secondary | ICD-10-CM | POA: Diagnosis not present

## 2017-04-27 DIAGNOSIS — Z96641 Presence of right artificial hip joint: Secondary | ICD-10-CM | POA: Diagnosis not present

## 2017-04-27 DIAGNOSIS — I2510 Atherosclerotic heart disease of native coronary artery without angina pectoris: Secondary | ICD-10-CM | POA: Diagnosis not present

## 2017-05-02 ENCOUNTER — Ambulatory Visit
Admission: RE | Admit: 2017-05-02 | Discharge: 2017-05-02 | Disposition: A | Discharge status: Home / Self Care | Payer: Medicare Other | Source: Ambulatory Visit | Attending: Unknown Physician Specialty | Admitting: Unknown Physician Specialty

## 2017-05-02 ENCOUNTER — Encounter
Admission: RE | Disposition: A | Discharge status: Home / Self Care | Payer: Self-pay | Source: Ambulatory Visit | Attending: Unknown Physician Specialty

## 2017-05-02 ENCOUNTER — Encounter: Payer: Self-pay | Admitting: *Deleted

## 2017-05-02 ENCOUNTER — Other Ambulatory Visit: Payer: Self-pay | Admitting: Internal Medicine

## 2017-05-02 DIAGNOSIS — I2510 Atherosclerotic heart disease of native coronary artery without angina pectoris: Secondary | ICD-10-CM | POA: Insufficient documentation

## 2017-05-02 DIAGNOSIS — K625 Hemorrhage of anus and rectum: Secondary | ICD-10-CM | POA: Insufficient documentation

## 2017-05-02 DIAGNOSIS — I10 Essential (primary) hypertension: Secondary | ICD-10-CM | POA: Diagnosis not present

## 2017-05-02 DIAGNOSIS — Z7984 Long term (current) use of oral hypoglycemic drugs: Secondary | ICD-10-CM | POA: Diagnosis not present

## 2017-05-02 DIAGNOSIS — Z800 Family history of malignant neoplasm of digestive organs: Secondary | ICD-10-CM | POA: Insufficient documentation

## 2017-05-02 DIAGNOSIS — I252 Old myocardial infarction: Secondary | ICD-10-CM | POA: Diagnosis not present

## 2017-05-02 DIAGNOSIS — E785 Hyperlipidemia, unspecified: Secondary | ICD-10-CM | POA: Diagnosis not present

## 2017-05-02 DIAGNOSIS — Z7902 Long term (current) use of antithrombotics/antiplatelets: Secondary | ICD-10-CM | POA: Insufficient documentation

## 2017-05-02 DIAGNOSIS — K648 Other hemorrhoids: Secondary | ICD-10-CM | POA: Insufficient documentation

## 2017-05-02 DIAGNOSIS — K5730 Diverticulosis of large intestine without perforation or abscess without bleeding: Secondary | ICD-10-CM | POA: Insufficient documentation

## 2017-05-02 DIAGNOSIS — K644 Residual hemorrhoidal skin tags: Secondary | ICD-10-CM | POA: Diagnosis not present

## 2017-05-02 DIAGNOSIS — E119 Type 2 diabetes mellitus without complications: Secondary | ICD-10-CM | POA: Diagnosis not present

## 2017-05-02 DIAGNOSIS — H9192 Unspecified hearing loss, left ear: Secondary | ICD-10-CM | POA: Insufficient documentation

## 2017-05-02 DIAGNOSIS — Z96641 Presence of right artificial hip joint: Secondary | ICD-10-CM | POA: Insufficient documentation

## 2017-05-02 DIAGNOSIS — Z79899 Other long term (current) drug therapy: Secondary | ICD-10-CM | POA: Insufficient documentation

## 2017-05-02 DIAGNOSIS — K649 Unspecified hemorrhoids: Secondary | ICD-10-CM | POA: Diagnosis not present

## 2017-05-02 HISTORY — PX: FLEXIBLE SIGMOIDOSCOPY: SHX5431

## 2017-05-02 LAB — GLUCOSE, CAPILLARY: Glucose-Capillary: 79 mg/dL (ref 65–99)

## 2017-05-02 SURGERY — SIGMOIDOSCOPY, FLEXIBLE

## 2017-05-02 MED ORDER — LIDOCAINE HCL 2 % EX GEL
CUTANEOUS | Status: AC
  Filled 2017-05-02: qty 5

## 2017-05-02 MED ORDER — LIDOCAINE HCL 2 % EX GEL
CUTANEOUS | Status: DC | PRN
  Administered 2017-05-02: 1 via TOPICAL

## 2017-05-02 MED ORDER — FLEET ENEMA 7-19 GM/118ML RE ENEM
1.0000 | ENEMA | Freq: Once | RECTAL | Status: AC
  Administered 2017-05-02: 1 via RECTAL

## 2017-05-02 MED ORDER — SODIUM CHLORIDE 0.9 % IV SOLN: INTRAVENOUS | Status: DC

## 2017-05-02 MED ORDER — DEXTROSE 5 % IV SOLN
60.0000 mg | Freq: Once | INTRAVENOUS | Status: DC
Start: 2017-05-02 — End: 2017-05-02
  Filled 2017-05-02: qty 1.5

## 2017-05-02 MED ORDER — CLINDAMYCIN HCL 150 MG PO CAPS
300.0000 mg | ORAL_CAPSULE | Freq: Once | ORAL | Status: AC
  Administered 2017-05-02: 300 mg via ORAL
  Filled 2017-05-02: qty 2

## 2017-05-02 NOTE — H&P (Signed)
Primary Care Physician:  [name], MD Primary Gastroenterologist:  Dr. Vira Agar  Pre-Procedure History & Physical: HPI:  Maureen Morales is a 84 y.o. female is here for an flexible sigmoid exam.    Past Medical History:  Diagnosis Date  . Asthma without status asthmaticus    unspecified  . CAD (coronary artery disease)   . Colitis    unspecified  . Diabetes mellitus   . Diabetes mellitus type 2, uncomplicated ([place])   . GERD (gastroesophageal reflux disease)   . 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 Hyperlipidemia    unspecified  . Hypertension   . Myocardial infarction ([place]) 2018  . Positive H. pylori test   . Viral gastroenteritis due to [place]-like agent Nov 2012    Past Surgical History:  Procedure Laterality Date  . ABDOMINAL HYSTERECTOMY    . BLADDER SURGERY    . CARDIAC CATHETERIZATION    . CHOLECYSTECTOMY    . COLONOSCOPY     06/16/1987, 02/20/1995, 05/04/1999, 02/14/2005, 05/17/2007  . ESOPHAGOGASTRODUODENOSCOPY     05/11/1987, 06/13/1995, 10/09/1995, 05/04/1999, 01/16/2002  . HIP ARTHROPLASTY Right 02/17/2017   Procedure: ARTHROPLASTY BIPOLAR HIP (HEMIARTHROPLASTY);  Surgeon: Claud Kelp, MD;  Location: [facility] ORS;  Service: Orthopedics;  Laterality: Right;  . LEFT HEART CATH AND CORONARY ANGIOGRAPHY N/A 08/31/2016   Procedure: Left Heart Cath and Coronary Angiography;  Surgeon: Wellington Hampshire, MD;  Location: [facility];  Service: Cardiovascular;  Laterality: N/A;    Prior to Admission medications   Medication Sig Start Date End Date Taking? Authorizing Provider  atorvastatin (LIPITOR) 40 MG tablet TAKE 1 TABLET BY MOUTH DAILY 04/06/17  Yes [name], MD  calcium carbonate (TUMS - DOSED IN MG ELEMENTAL CALCIUM) 500 MG chewable tablet Chew 1 tablet by mouth daily as needed for indigestion or heartburn.   Yes [provider]  clopidogrel (PLAVIX) 75 MG tablet Take 75 mg by mouth daily.   Yes [provider]   glipiZIDE (GLUCOTROL) 5 MG tablet Take 2.5 mg by mouth 2 (two) times daily before a meal.   Yes [provider]  loratadine (CLARITIN) 10 MG tablet Take 10 mg daily by mouth.    Yes [provider]  losartan (COZAAR) 100 MG tablet Take 100 mg by mouth daily.   Yes [provider]  metoprolol succinate (TOPROL-XL) 50 MG 24 hr tablet TAKE 1 TABLET BY MOUTH DAILY 01/08/17  Yes [name], MD  sertraline (ZOLOFT) 50 MG tablet Take 50 mg by mouth daily.   Yes [provider]  vitamin C (ASCORBIC ACID) 500 MG tablet Take 500 mg by mouth 2 (two) times daily.   Yes [provider]  fenofibrate 160 MG tablet TAKE 1 TABLET BY MOUTH DAILY FOR HIGH TRIGLYCERIDES 05/02/17   [name], MD  glucose blood (BAYER CONTOUR TEST) test strip Use as instructed 02/28/13   [name], MD  nitroGLYCERIN (NITROSTAT) 0.4 MG SL tablet Place 1 tablet (0.4 mg total) under the tongue every 5 (five) minutes as needed for chest pain. 09/02/16   Nicholes Mango, MD    Allergies as of 04/30/2017 - Review Complete 04/30/2017  Allergen Reaction Noted  . Aspirin Swelling 09/23/2010  . Atropine Other (See Comments) 09/23/2010  . Belladonna alkaloids Other (See Comments) 09/23/2010  . Codeine Other (See Comments) 09/23/2010  . Darvon Other (See Comments) 09/23/2010  . Methocarbamol Other (See Comments) 07/31/2013  . Penicillins  Other (See Comments) 09/23/2010  . Sulfa antibiotics Other (See Comments) 08/30/2016  . Iron Other (See Comments) 04/29/2013    Family History  Problem Relation Age of Onset  . Cancer Mother   . Breast cancer Mother 83  . Colon cancer Father   . Breast cancer Sister 67  . Colon cancer Brother   . Colon cancer Brother     Social History   Socioeconomic History  . Marital status: Married    Spouse name: [name]  . Number of children: 4  . Years of education: 37  . Highest education level: High school graduate  Social Needs  . Financial  resource strain: Not on file  . Food insecurity - worry: Not on file  . Food insecurity - inability: Not on file  . Transportation needs - medical: Not on file  . Transportation needs - non-medical: Not on file  Occupational History  . Not on file  Tobacco Use  . Smoking status: Never Smoker  . Smokeless tobacco: Never Used  Substance and Sexual Activity  . Alcohol use: No  . Drug use: No  . Sexual activity: Not on file  Other Topics Concern  . Not on file  Social History Narrative   Married   4 children   Never smoker   Denies alcohol use   Full Code    Review of Systems: See HPI, otherwise negative ROS  Physical Exam: BP (!) 177/63   Pulse 69   Temp (!) 97 F (36.1 C) (Tympanic)   Resp 20   Ht 5' (1.524 m)   Wt 56.2 kg (124 lb)   SpO2 100%   BMI 24.22 kg/m  General:   Alert,  pleasant and cooperative in NAD Head:  Normocephalic and atraumatic. Neck:  Supple; no masses or thyromegaly. Lungs:  Clear throughout to auscultation.    Heart:  Regular rate and rhythm. Abdomen:  Soft, nontender and nondistended. Normal bowel sounds, without guarding, and without rebound.   Neurologic:  Alert and  oriented x4;  grossly normal neurologically.  Impression/Plan: Maureen Morales is here for a flexible sigmoidoscopy to be performed for rectal bleeding.  Risks, benefits, limitations, and alternatives regarding  flexible sigmoidoscopy have been reviewed with the patient.  Questions have been answered.  All parties agreeable.   Gaylyn Cheers, MD  05/02/2017, 5:04 PM

## 2017-05-02 NOTE — Op Note (Signed)
[facility] Patient Name: Maureen Morales Procedure Date: 05/02/2017 3:37 PM MRN: [id number] Account #: [id number] Date of Birth: 1923-03-30 Admit Type: Outpatient Age: 84 Room: [facility] ENDO ROOM 3 Gender: Female Note Status: Finalized Procedure:            Flexible Sigmoidoscopy Indications:          Rectal hemorrhage Providers:            Manya Silvas, MD Medicines:            None Complications:        No immediate complications. Procedure:            Pre-Anesthesia Assessment:                       - After reviewing the risks and benefits, the patient                        was deemed in satisfactory condition to undergo the                        procedure.                       After obtaining informed consent, the scope was passed                        under direct vision. The Colonoscope was introduced                        through the anus and advanced to the the sigmoid colon.                        The flexible sigmoidoscopy was accomplished without                        difficulty. The patient tolerated the procedure well.                        The quality of the bowel preparation was excellent. Findings:      The digital rectal exam findings include non-thrombosed external       hemorrhoids and non-thrombosed internal hemorrhoids. No thrombosed areas       but one area swollen likely a      firm external hemorrhoid. Other smaller ones seen.      A few medium-mouthed diverticula were found in the sigmoid colon.       Otherwise normal. Impression:           - Non-thrombosed external hemorrhoids and                        non-thrombosed internal hemorrhoids found on digital                        rectal exam.                       - Diverticulosis in the sigmoid colon.                       - No specimens collected. Recommendation:       - The findings and recommendations were discussed  with                        the patient's  family. See Dr. Fleet Contras next week. Manya Silvas, MD 05/02/2017 4:22:46 PM This report has been signed electronically. Number of Addenda: 0 Note Initiated On: 05/02/2017 3:37 PM Total Procedure Duration: 0 hours 5 minutes 48 seconds       [facility]

## 2017-05-03 ENCOUNTER — Encounter: Payer: Self-pay | Admitting: Unknown Physician Specialty

## 2017-05-04 ENCOUNTER — Other Ambulatory Visit: Payer: Self-pay

## 2017-05-04 MED ORDER — VITAMIN C 500 MG PO TABS: 500.0000 mg | ORAL_TABLET | Freq: Two times a day (BID) | ORAL | 2 refills | Status: AC

## 2017-05-09 ENCOUNTER — Encounter: Payer: Self-pay | Admitting: General Surgery

## 2017-05-09 ENCOUNTER — Ambulatory Visit (INDEPENDENT_AMBULATORY_CARE_PROVIDER_SITE_OTHER): Payer: Medicare Other | Admitting: General Surgery

## 2017-05-09 VITALS — BP 162/82 | HR 70 | Resp 16 | Ht 60.0 in | Wt 128.0 lb

## 2017-05-09 DIAGNOSIS — K645 Perianal venous thrombosis: Secondary | ICD-10-CM

## 2017-05-09 NOTE — Patient Instructions (Signed)
The patient is aware to call back for any questions or concerns.  

## 2017-05-09 NOTE — Progress Notes (Signed)
Patient ID: Maureen Morales, female   DOB: [birth year], 84 y.o.   MRN: [id number]  No chief complaint on file.   HPI Maureen Morales is a 84 y.o. female.  Here today for evaluation of internal and external hemorrhoids.  The patient suffered a hip fracture in fall 2018 and during her stay in rehabilitation she developed fulminant diarrhea for about 3 weeks.  The patient developed rectal bleeding and this prompted a limited sigmoidoscopic exam recently completed by Dr. Vira Agar. Stopped her Plavix due to bleeding hemorrhoids. She has had 2 blood transfusions. Dr Vira Agar completed flexible sigmoidoscopy 05-02-17. Bowels move at least 2-3 times a day. No bleeding for 3 weeks. She does admit to stool leakage. No further troublesome diarrhea.  She is here with her daughter, Maureen Morales.  The patient was evaluated here on November 28, 2016 regarding an abnormal CT scan of the pelvis.  At that time, the patient was not experiencing any difficulty with diarrhea or bowel function. HPI  Past Medical History:  Diagnosis Date  . Asthma without status asthmaticus    unspecified  . CAD (coronary artery disease)   . Colitis    unspecified  . Diabetes mellitus   . Diabetes mellitus type 2, uncomplicated ([place])   . GERD (gastroesophageal reflux disease)   . 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 Hyperlipidemia    unspecified  . Hypertension   . Myocardial infarction ([place]) 2018  . Positive H. pylori test   . Viral gastroenteritis due to [place]-like agent Nov 2012    Past Surgical History:  Procedure Laterality Date  . ABDOMINAL HYSTERECTOMY    . BLADDER SURGERY    . CARDIAC CATHETERIZATION    . CHOLECYSTECTOMY    . COLONOSCOPY     06/16/1987, 02/20/1995, 05/04/1999, 02/14/2005, 05/17/2007  . ESOPHAGOGASTRODUODENOSCOPY     05/11/1987, 06/13/1995, 10/09/1995, 05/04/1999, 01/16/2002  . FLEXIBLE SIGMOIDOSCOPY N/A 05/02/2017   Procedure: FLEXIBLE SIGMOIDOSCOPY;  Surgeon: Manya Silvas, MD;  Location: [facility]  ENDOSCOPY;  Service: Endoscopy;  Laterality: N/A;  . HIP ARTHROPLASTY Right 02/17/2017   Procedure: ARTHROPLASTY BIPOLAR HIP (HEMIARTHROPLASTY);  Surgeon: Claud Kelp, MD;  Location: [facility] ORS;  Service: Orthopedics;  Laterality: Right;  . LEFT HEART CATH AND CORONARY ANGIOGRAPHY N/A 08/31/2016   Procedure: Left Heart Cath and Coronary Angiography;  Surgeon: Wellington Hampshire, MD;  Location: [facility];  Service: Cardiovascular;  Laterality: N/A;    Family History  Problem Relation Age of Onset  . Cancer Mother   . Breast cancer Mother 22  . Colon cancer Father   . Breast cancer Sister 30  . Colon cancer Brother   . Colon cancer Brother     Social History Social History   Tobacco Use  . Smoking status: Never Smoker  . Smokeless tobacco: Never Used  Substance Use Topics  . Alcohol use: No  . Drug use: No    Allergies  Allergen Reactions  . Aspirin Swelling  . Atropine Other (See Comments)    Reaction: unknown  . Belladonna Alkaloids Other (See Comments)    Reaction: unknown  . Codeine Other (See Comments)    Reaction: unknown  . Darvon Other (See Comments)    Reaction: unknown  . Methocarbamol Other (See Comments)    Reaction: unknown  . Penicillins Other (See Comments)    Reaction: unknown Has patient had a PCN reaction causing immediate rash, facial/tongue/throat swelling, SOB or lightheadedness with hypotension: Unknown Has patient had  a PCN reaction causing severe rash involving mucus membranes or skin necrosis: Unknown Has patient had a PCN reaction that required hospitalization: Unknown Has patient had a PCN reaction occurring within the last 10 years: Unknown If all of the above answers are "NO", then may proceed with Cephalosporin use.   . Sulfa Antibiotics Other (See Comments)    Reaction: unknown  . Iron Other (See Comments)    High dose prescription gives her diarrhea    Current Outpatient Medications  Medication Sig Dispense Refill  .  atorvastatin (LIPITOR) 40 MG tablet TAKE 1 TABLET BY MOUTH DAILY 30 tablet 3  . fenofibrate 160 MG tablet TAKE 1 TABLET BY MOUTH DAILY FOR HIGH TRIGLYCERIDES 30 tablet 2  . glucose blood (BAYER CONTOUR TEST) test strip Use as instructed 100 each 5  . loratadine (CLARITIN) 10 MG tablet Take 10 mg daily by mouth.     . losartan (COZAAR) 100 MG tablet Take 100 mg by mouth daily.    . metoprolol succinate (TOPROL-XL) 50 MG 24 hr tablet TAKE 1 TABLET BY MOUTH DAILY 30 tablet 3  . nitroGLYCERIN (NITROSTAT) 0.4 MG SL tablet Place 1 tablet (0.4 mg total) under the tongue every 5 (five) minutes as needed for chest pain. 10 tablet 0  . sertraline (ZOLOFT) 50 MG tablet Take 50 mg by mouth daily.    . vitamin C (ASCORBIC ACID) 500 MG tablet Take 1 tablet (500 mg total) by mouth 2 (two) times daily. 60 tablet 2   No current facility-administered medications for this visit.     Review of Systems Review of Systems  Respiratory: Negative.   Cardiovascular: Negative.   Neurological: Positive for weakness.    Blood pressure (!) 162/82, pulse 70, resp. rate 16, height 5' (1.524 m), weight 128 lb (58.1 kg), SpO2 100 %.  Physical Exam Physical Exam  Constitutional: She is oriented to person, place, and time. She appears well-developed and well-nourished.  HENT:  Mouth/Throat: Oropharynx is clear and moist.  Eyes: Conjunctivae are normal. No scleral icterus.  Neck: Neck supple.  Cardiovascular: Normal rate, regular rhythm and normal heart sounds.  Pulmonary/Chest: Effort normal and breath sounds normal.  Genitourinary: Rectal exam shows external hemorrhoid.     Genitourinary Comments: Anterior hemorrhoid  Lymphadenopathy:    She has no cervical adenopathy.  Neurological: She is alert and oriented to person, place, and time.  Skin: Skin is warm and dry.  Psychiatric: Her behavior is normal.    Data Reviewed May 02, 2016 flexible sigmoidoscopy report reviewed.  Direct communication with  Gaylyn Cheers, MD.  Rigid sigmoidoscopy of November 28, 2016 had been normal.  Assessment    Fecal incontinence secondary to soft tissue mass across the anal sphincter.  Clinically most likely a thrombosed/fibrotic hemorrhoid, although faint erythema is noted without pain.    Plan    This area will be best managed by excision under local anesthesia with sedation as an outpatient procedure.  Risks of bleeding and infection were reviewed.  Potential for persistent incontinence of stool reviewed.  Will coordinate around her upcoming orthopedic evaluation for her persistent hip pain post hemiarthroplasty.    With the faint erythema, I might normally consider a course of antibiotics, but with the reported profound diarrhea after her Hospitalization for hip surgery, I am reluctant to take a risk with pseudomembranous colitis.  As she is  is symptomatic primarily and related to stool leakage rather than pain, will not institute antibiotics at this time.  HPI, Physical  Exam, Assessment and Plan have been scribed under the direction and in the presence of Robert Bellow, MD. Karie Fetch, RN  I have completed the exam and reviewed the above documentation for accuracy and completeness.  I agree with the above.  Haematologist has been used and any errors in dictation or transcription are unintentional.  Hervey Ard, M.D., F.A.C.S.   Forest Gleason Jahsiah Carpenter 05/10/2017, 8:37 AM  Patient's surgery has been scheduled for 05-14-17 at [facility]   Dominga Ferry, CMA

## 2017-05-10 DIAGNOSIS — K645 Perianal venous thrombosis: Secondary | ICD-10-CM | POA: Insufficient documentation

## 2017-05-11 ENCOUNTER — Encounter
Admission: RE | Admit: 2017-05-11 | Discharge: 2017-05-11 | Disposition: A | Discharge status: Home / Self Care | Payer: Medicare Other | Source: Ambulatory Visit | Attending: General Surgery | Admitting: General Surgery

## 2017-05-11 ENCOUNTER — Other Ambulatory Visit: Payer: Self-pay

## 2017-05-11 DIAGNOSIS — I10 Essential (primary) hypertension: Secondary | ICD-10-CM | POA: Diagnosis not present

## 2017-05-11 DIAGNOSIS — K644 Residual hemorrhoidal skin tags: Secondary | ICD-10-CM | POA: Diagnosis not present

## 2017-05-11 DIAGNOSIS — Z79899 Other long term (current) drug therapy: Secondary | ICD-10-CM | POA: Diagnosis not present

## 2017-05-11 DIAGNOSIS — E119 Type 2 diabetes mellitus without complications: Secondary | ICD-10-CM | POA: Diagnosis not present

## 2017-05-11 DIAGNOSIS — E785 Hyperlipidemia, unspecified: Secondary | ICD-10-CM | POA: Diagnosis not present

## 2017-05-11 DIAGNOSIS — K603 Anal fistula: Secondary | ICD-10-CM | POA: Diagnosis not present

## 2017-05-11 DIAGNOSIS — I2510 Atherosclerotic heart disease of native coronary artery without angina pectoris: Secondary | ICD-10-CM | POA: Diagnosis not present

## 2017-05-11 DIAGNOSIS — K645 Perianal venous thrombosis: Secondary | ICD-10-CM | POA: Diagnosis not present

## 2017-05-11 DIAGNOSIS — Z96649 Presence of unspecified artificial hip joint: Secondary | ICD-10-CM | POA: Diagnosis not present

## 2017-05-11 HISTORY — DX: Other complications of anesthesia, initial encounter: T88.59XA

## 2017-05-11 HISTORY — DX: Peripheral vascular disease, unspecified: I73.9

## 2017-05-11 HISTORY — DX: Adverse effect of unspecified anesthetic, initial encounter: T41.45XA

## 2017-05-11 LAB — HEMOGLOBIN: Hemoglobin: 10.9 g/dL — ABNORMAL LOW (ref 12.0–16.0)

## 2017-05-11 NOTE — Patient Instructions (Signed)
Your procedure is scheduled on: 05/14/17  @ 8:00 am Report to Same Day Surgery 2nd floor medical mall [facility] Entrance-take elevator on left to 2nd floor.  Check in with surgery information desk.)   Remember: Instructions that are not followed completely may result in serious medical risk, up to and including death, or upon the discretion of your surgeon and anesthesiologist your surgery may need to be rescheduled.    _x___ 1. Do not eat food after midnight the night before your procedure. You may drink clear liquids up to 2 hours before you are scheduled to arrive at the hospital for your procedure.  Do not drink clear liquids within 2 hours of your scheduled arrival to the hospital.  Clear liquids include  --Water or Apple juice without pulp  --Clear carbohydrate beverage such as ClearFast or Gatorade  --Black Coffee or Clear Tea (No milk, no creamers, do not add anything to                  the coffee or Tea Type 1 and type 2 diabetics should only drink water.  No gum chewing or hard candies.     __x__ 2. No Alcohol for 24 hours before or after surgery.   __x__3. No Smoking for 24 prior to surgery.   ____  4. Bring all medications with you on the day of surgery if instructed.    __x__ 5. Notify your doctor if there is any change in your medical condition     (cold, fever, infections).     Do not wear jewelry, make-up, hairpins, clips or nail polish.  Do not wear lotions, powders, or perfumes. You may wear deodorant.  Do not shave 48 hours prior to surgery. Men may shave face and neck.  Do not bring valuables to the hospital.    [facility] is not responsible for any belongings or valuables.               Contacts, dentures or bridgework may not be worn into surgery.  Leave your suitcase in the car. After surgery it may be brought to your room.  For patients admitted to the hospital, discharge time is determined by your                       treatment team.   Patients  discharged the day of surgery will not be allowed to drive home.  You will need someone to drive you home and stay with you the night of your procedure.    Please read over the following fact sheets that you were given:   [facility] Preparing for Surgery and or MRSA Information   _x___ Take anti-hypertensive listed below, cardiac, seizure, asthma,     anti-reflux and psychiatric medicines. These include:  1. atorvastatin (LIPITOR) 40 MG tablet  2.fenofibrate 160 MG tablet  3.metoprolol succinate (TOPROL-XL) 50 MG 24 hr tablet  4.  5.  6.  ____Fleets enema or Magnesium Citrate as directed.   _x___ Use CHG Soap or sage wipes as directed on instruction sheet   ____ Use inhalers on the day of surgery and bring to hospital day of surgery  ____ Stop Metformin and Janumet 2 days prior to surgery.    ____ Take 1/2 of usual insulin dose the night before surgery and none on the morning     surgery.   _x___ Follow recommendations from Cardiologist, Pulmonologist or PCP regarding  stopping Aspirin, Coumadin, Plavix ,Eliquis, Effient, or Pradaxa, and Pletal.  X____Stop Anti-inflammatories such as Advil, Aleve, Ibuprofen, Motrin, Naproxen, Naprosyn, Goodies powders or aspirin products. OK to take Tylenol and                          Celebrex.   _x___ Stop supplements until after surgery.  But may continue Vitamin D, Vitamin B,       and multivitamin.   ____ Bring C-Pap to the hospital.

## 2017-05-13 MED ORDER — CEFOTETAN DISODIUM 2 G IJ SOLR
2.0000 g | INTRAMUSCULAR | Status: AC
  Administered 2017-05-14: 2 g via INTRAVENOUS
  Filled 2017-05-13: qty 2

## 2017-05-14 ENCOUNTER — Encounter: Payer: Self-pay | Admitting: *Deleted

## 2017-05-14 ENCOUNTER — Ambulatory Visit: Payer: Medicare Other | Admitting: Certified Registered Nurse Anesthetist

## 2017-05-14 ENCOUNTER — Ambulatory Visit
Admission: RE | Admit: 2017-05-14 | Discharge: 2017-05-14 | Disposition: A | Discharge status: Home / Self Care | Payer: Medicare Other | Source: Ambulatory Visit | Attending: General Surgery | Admitting: General Surgery

## 2017-05-14 ENCOUNTER — Other Ambulatory Visit: Payer: Self-pay

## 2017-05-14 ENCOUNTER — Encounter
Admission: RE | Disposition: A | Discharge status: Home / Self Care | Payer: Self-pay | Source: Ambulatory Visit | Attending: General Surgery

## 2017-05-14 DIAGNOSIS — I10 Essential (primary) hypertension: Secondary | ICD-10-CM | POA: Diagnosis not present

## 2017-05-14 DIAGNOSIS — K645 Perianal venous thrombosis: Secondary | ICD-10-CM | POA: Diagnosis not present

## 2017-05-14 DIAGNOSIS — Z79899 Other long term (current) drug therapy: Secondary | ICD-10-CM | POA: Insufficient documentation

## 2017-05-14 DIAGNOSIS — E785 Hyperlipidemia, unspecified: Secondary | ICD-10-CM | POA: Diagnosis not present

## 2017-05-14 DIAGNOSIS — K644 Residual hemorrhoidal skin tags: Secondary | ICD-10-CM | POA: Diagnosis not present

## 2017-05-14 DIAGNOSIS — E119 Type 2 diabetes mellitus without complications: Secondary | ICD-10-CM | POA: Insufficient documentation

## 2017-05-14 DIAGNOSIS — E1121 Type 2 diabetes mellitus with diabetic nephropathy: Secondary | ICD-10-CM | POA: Diagnosis not present

## 2017-05-14 DIAGNOSIS — I2510 Atherosclerotic heart disease of native coronary artery without angina pectoris: Secondary | ICD-10-CM | POA: Diagnosis not present

## 2017-05-14 DIAGNOSIS — J45909 Unspecified asthma, uncomplicated: Secondary | ICD-10-CM | POA: Diagnosis not present

## 2017-05-14 DIAGNOSIS — K605 Anorectal fistula: Secondary | ICD-10-CM | POA: Diagnosis not present

## 2017-05-14 DIAGNOSIS — K603 Anal fistula: Secondary | ICD-10-CM | POA: Diagnosis not present

## 2017-05-14 HISTORY — PX: HEMORRHOID SURGERY: SHX153

## 2017-05-14 LAB — GLUCOSE, CAPILLARY
GLUCOSE-CAPILLARY: 152 mg/dL — AB (ref 65–99)
GLUCOSE-CAPILLARY: 164 mg/dL — AB (ref 65–99)

## 2017-05-14 SURGERY — HEMORRHOIDECTOMY
Anesthesia: General | Wound class: Contaminated

## 2017-05-14 MED ORDER — SODIUM CHLORIDE FLUSH 0.9 % IV SOLN
INTRAVENOUS | Status: AC
  Filled 2017-05-14: qty 10

## 2017-05-14 MED ORDER — PROMETHAZINE HCL 25 MG/ML IJ SOLN
6.2500 mg | Freq: Once | INTRAMUSCULAR | Status: AC
  Administered 2017-05-14: 6.25 mg via INTRAVENOUS

## 2017-05-14 MED ORDER — ACETAMINOPHEN 10 MG/ML IV SOLN
INTRAVENOUS | Status: AC
  Filled 2017-05-14: qty 100

## 2017-05-14 MED ORDER — PROMETHAZINE HCL 25 MG/ML IJ SOLN
INTRAMUSCULAR | Status: AC
  Filled 2017-05-14: qty 1

## 2017-05-14 MED ORDER — BUPIVACAINE HCL (PF) 0.25 % IJ SOLN
INTRAMUSCULAR | Status: AC
  Filled 2017-05-14: qty 30

## 2017-05-14 MED ORDER — SODIUM CHLORIDE 0.9 % IV SOLN
INTRAVENOUS | Status: DC
  Administered 2017-05-14: 09:00:00 via INTRAVENOUS

## 2017-05-14 MED ORDER — FENTANYL CITRATE (PF) 100 MCG/2ML IJ SOLN
25.0000 ug | INTRAMUSCULAR | Status: DC | PRN
Start: 2017-05-14 — End: 2017-05-14

## 2017-05-14 MED ORDER — HYDROCODONE-ACETAMINOPHEN 5-325 MG PO TABS: 1.0000 | ORAL_TABLET | ORAL | 0 refills | Status: DC | PRN

## 2017-05-14 MED ORDER — ONDANSETRON HCL 4 MG/2ML IJ SOLN
INTRAMUSCULAR | Status: AC
  Administered 2017-05-14: 4 mg via INTRAVENOUS
  Filled 2017-05-14: qty 2

## 2017-05-14 MED ORDER — PHENYLEPHRINE HCL 10 MG/ML IJ SOLN
INTRAMUSCULAR | Status: DC | PRN
  Administered 2017-05-14: 100 ug via INTRAVENOUS
  Administered 2017-05-14: 200 ug via INTRAVENOUS
  Administered 2017-05-14 (×3): 100 ug via INTRAVENOUS

## 2017-05-14 MED ORDER — BUPIVACAINE LIPOSOME 1.3 % IJ SUSP
INTRAMUSCULAR | Status: AC
  Filled 2017-05-14: qty 20

## 2017-05-14 MED ORDER — FENTANYL CITRATE (PF) 100 MCG/2ML IJ SOLN
INTRAMUSCULAR | Status: AC
  Filled 2017-05-14: qty 2

## 2017-05-14 MED ORDER — LIDOCAINE HCL (CARDIAC) 20 MG/ML IV SOLN
INTRAVENOUS | Status: DC | PRN
  Administered 2017-05-14: 60 mg via INTRAVENOUS

## 2017-05-14 MED ORDER — ACETAMINOPHEN 10 MG/ML IV SOLN
INTRAVENOUS | Status: DC | PRN
  Administered 2017-05-14: 1000 mg via INTRAVENOUS

## 2017-05-14 MED ORDER — FENTANYL CITRATE (PF) 100 MCG/2ML IJ SOLN
INTRAMUSCULAR | Status: DC | PRN
  Administered 2017-05-14: 25 ug via INTRAVENOUS

## 2017-05-14 MED ORDER — BUPIVACAINE HCL (PF) 0.25 % IJ SOLN
INTRAMUSCULAR | Status: DC | PRN
  Administered 2017-05-14: 30 mL

## 2017-05-14 MED ORDER — ONDANSETRON HCL 4 MG/2ML IJ SOLN
INTRAMUSCULAR | Status: DC | PRN
  Administered 2017-05-14: 4 mg via INTRAVENOUS

## 2017-05-14 MED ORDER — PROPOFOL 10 MG/ML IV BOLUS
INTRAVENOUS | Status: DC | PRN
  Administered 2017-05-14: 80 mg via INTRAVENOUS

## 2017-05-14 MED ORDER — DEXAMETHASONE SODIUM PHOSPHATE 10 MG/ML IJ SOLN
INTRAMUSCULAR | Status: DC | PRN
  Administered 2017-05-14: 4 mg via INTRAVENOUS

## 2017-05-14 MED ORDER — ONDANSETRON HCL 4 MG/2ML IJ SOLN
4.0000 mg | Freq: Once | INTRAMUSCULAR | Status: AC | PRN
  Administered 2017-05-14: 4 mg via INTRAVENOUS

## 2017-05-14 MED ORDER — BUPIVACAINE LIPOSOME 1.3 % IJ SUSP
INTRAMUSCULAR | Status: DC | PRN
  Administered 2017-05-14: 20 mL

## 2017-05-14 MED ORDER — PROPOFOL 10 MG/ML IV BOLUS
INTRAVENOUS | Status: AC
  Filled 2017-05-14: qty 20

## 2017-05-14 MED ORDER — LACTATED RINGERS IV SOLN
INTRAVENOUS | Status: DC | PRN
  Administered 2017-05-14: 09:00:00 via INTRAVENOUS

## 2017-05-14 SURGICAL SUPPLY — 27 items
BLADE SURG 15 STRL SS SAFETY (BLADE) ×3 IMPLANT
BRIEF STRETCH MATERNITY 2XLG (MISCELLANEOUS) ×3 IMPLANT
CANISTER SUCT 1200ML W/VALVE (MISCELLANEOUS) ×3 IMPLANT
DRAPE LAPAROTOMY 100X77 ABD (DRAPES) ×3 IMPLANT
DRAPE LEGGINS SURG 28X43 STRL (DRAPES) ×3 IMPLANT
DRAPE UNDER BUTTOCK W/FLU (DRAPES) ×3 IMPLANT
DRSG GAUZE PETRO 6X36 STRIP ST (GAUZE/BANDAGES/DRESSINGS) ×3 IMPLANT
ELECT REM PT RETURN 9FT ADLT (ELECTROSURGICAL) ×3
ELECTRODE REM PT RTRN 9FT ADLT (ELECTROSURGICAL) ×1 IMPLANT
GLOVE BIO SURGEON STRL SZ7.5 (GLOVE) ×9 IMPLANT
GLOVE INDICATOR 8.0 STRL GRN (GLOVE) ×3 IMPLANT
GOWN STRL REUS W/ TWL LRG LVL3 (GOWN DISPOSABLE) ×2 IMPLANT
GOWN STRL REUS W/TWL LRG LVL3 (GOWN DISPOSABLE) ×4
KIT RM TURNOVER STRD PROC AR (KITS) ×3 IMPLANT
LABEL OR SOLS (LABEL) ×3 IMPLANT
NEEDLE HYPO 22GX1.5 SAFETY (NEEDLE) ×3 IMPLANT
NEEDLE HYPO 25X1 1.5 SAFETY (NEEDLE) ×3 IMPLANT
PACK BASIN MINOR ARMC (MISCELLANEOUS) ×3 IMPLANT
PAD OB MATERNITY 4.3X12.25 (PERSONAL CARE ITEMS) ×3 IMPLANT
PAD PREP 24X41 OB/GYN DISP (PERSONAL CARE ITEMS) ×3 IMPLANT
SHEARS HARMONIC 9CM CVD (BLADE) ×3 IMPLANT
SURGILUBE 2OZ TUBE FLIPTOP (MISCELLANEOUS) ×3 IMPLANT
SUT CHROMIC 3 0 SH 27 (SUTURE) ×3 IMPLANT
SUT SILK 0 CT 1 30 (SUTURE) ×3 IMPLANT
SUT VIC AB 3-0 SH 27 (SUTURE)
SUT VIC AB 3-0 SH 27X BRD (SUTURE) IMPLANT
SYR CONTROL 10ML (SYRINGE) ×6 IMPLANT

## 2017-05-14 NOTE — Anesthesia Postprocedure Evaluation (Signed)
Anesthesia Post Note  Patient: Maureen Morales  Procedure(s) Performed: EXTERNAL THROMBOSED HEMORRHOIDECTOMY (N/A )  Patient location during evaluation: PACU Anesthesia Type: General Level of consciousness: awake and alert and oriented Pain management: pain level controlled Vital Signs Assessment: post-procedure vital signs reviewed and stable Respiratory status: spontaneous breathing, nonlabored ventilation and respiratory function stable Cardiovascular status: blood pressure returned to baseline and stable Postop Assessment: no signs of nausea or vomiting Anesthetic complications: no     Last Vitals:  Vitals:   05/14/17 1109 05/14/17 1124  BP: (!) 135/53 (!) 179/57  Pulse: (!) 58 (!) 6  Resp: 11 14  Temp:  (!) 36.3 C  SpO2: 96% 98%    Last Pain:  Vitals:   05/14/17 1124  TempSrc: Temporal  PainSc:                  [name]

## 2017-05-14 NOTE — Anesthesia Procedure Notes (Signed)
Procedure Name: LMA Insertion Date/Time: 05/14/2017 9:08 AM Performed by: Willette Alma, CRNA Pre-anesthesia Checklist: Patient identified, Emergency Drugs available, Suction available, Patient being monitored and Timeout performed Patient Re-evaluated:Patient Re-evaluated prior to induction Oxygen Delivery Method: Circle system utilized Preoxygenation: Pre-oxygenation with 100% oxygen Induction Type: IV induction Ventilation: Mask ventilation without difficulty LMA Size: 3.5 Number of attempts: 1

## 2017-05-14 NOTE — Anesthesia Post-op Follow-up Note (Signed)
Anesthesia QCDR form completed.        

## 2017-05-14 NOTE — H&P (Signed)
No change in history or clinical exam.

## 2017-05-14 NOTE — Anesthesia Preprocedure Evaluation (Signed)
Anesthesia Evaluation  Patient identified by MRN, date of birth, ID band Patient awake    Reviewed: Allergy & Precautions, NPO status , Patient's Chart, lab work & pertinent test results  History of Anesthesia Complications Negative for: history of anesthetic complications  Airway Mallampati: II  TM Distance: >3 FB Neck ROM: Full    Dental  (+) Implants   Pulmonary asthma , neg sleep apnea,    breath sounds clear to auscultation- rhonchi (-) wheezing      Cardiovascular hypertension, Pt. on medications (-) angina+ CAD and + Past MI (NSTEMI 08/2016, heart cath with only minimal blockages)  (-) Cardiac Stents and (-) CABG  Rhythm:Regular Rate:Normal - Systolic murmurs and - Diastolic murmurs Echo 0/1/09: - Left ventricle: The cavity size was normal. There was moderate   asymmetric hypertrophy of the septum. Systolic function was   vigorous. The estimated ejection fraction was in the range of 65%   to 70%. Wall motion was normal; there were no regional wall   motion abnormalities. Doppler parameters are consistent with   abnormal left ventricular relaxation (grade 1 diastolic   dysfunction). - Aortic valve: There was mild stenosis. Mean gradient (S): 14 mm   Hg. Valve area (VTI): 1.53 cm^2. - Mitral valve: There was mild regurgitation. - Pulmonary arteries: Systolic pressure was mildly increased. PA   peak pressure: 45 mm Hg (S). - Pericardium, extracardiac: A trivial pericardial effusion was   identified posterior to the heart.  L heart cath 08/31/16: 1. Mild to moderate nonobstructive coronary artery disease. 2. Normal LV systolic function. 3. Mild aortic stenosis with a peak to peak gradient of 15 mmHg. There is also possibly a mild LVOT gradient. 4. Severely elevated systemic pressure with moderately elevated left ventricular end-diastolic pressure.   Neuro/Psych PSYCHIATRIC DISORDERS Anxiety negative neurological ROS     GI/Hepatic Neg liver ROS, GERD  ,  Endo/Other  diabetes (diet controlled)  Renal/GU Renal disease: hx of nephrolithiasis.     Musculoskeletal negative musculoskeletal ROS (+)   Abdominal (+) - obese,   Peds  Hematology  (+) anemia ,   Anesthesia Other Findings Past Medical History: No date: Asthma without status asthmaticus     Comment:  unspecified No date: CAD (coronary artery disease) No date: Colitis     Comment:  unspecified No date: Complication of anesthesia     Comment:  sensitive to anesthesia No date: Diabetes mellitus No date: Diabetes mellitus type 2, uncomplicated ([facility]) No date: GERD (gastroesophageal reflux disease) No date: Hearing loss in left ear     Comment:  sensory neural No date: Hyperlipidemia     Comment:  unspecified No date: Hypertension 2018: Myocardial infarction ([place]) No date: Peripheral vascular disease ([place]) No date: Positive H. pylori test Nov 2012: Viral gastroenteritis due to [place]-like agent   Reproductive/Obstetrics                             Anesthesia Physical Anesthesia Plan  ASA: III  Anesthesia Plan: General   Post-op Pain Management:    Induction: Intravenous  PONV Risk Score and Plan: 2 and Ondansetron and Dexamethasone  Airway Management Planned: LMA  Additional Equipment:   Intra-op Plan:   Post-operative Plan:   Informed Consent: I have reviewed the patients History and Physical, chart, labs and discussed the procedure including the risks, benefits and alternatives for the proposed anesthesia with the patient or authorized representative who has indicated his/her understanding and  acceptance.   Dental advisory given  Plan Discussed with: CRNA and Anesthesiologist  Anesthesia Plan Comments:         Anesthesia Quick Evaluation

## 2017-05-14 NOTE — Transfer of Care (Signed)
Immediate Anesthesia Transfer of Care Note  Patient: Maureen Morales  Procedure(s) Performed: EXTERNAL THROMBOSED HEMORRHOIDECTOMY (N/A )  Patient Location: PACU  Anesthesia Type:General  Level of Consciousness: awake, alert  and oriented  Airway & Oxygen Therapy: Patient Spontanous Breathing and Patient connected to nasal cannula oxygen  Post-op Assessment: Report given to RN and Post -op Vital signs reviewed and stable  Post vital signs: stable  Last Vitals:  Vitals:   05/14/17 0818 05/14/17 0954  BP: (!) 164/64 (!) 175/69  Pulse: 66 66  Resp: 16 11  Temp: (!) 36.4 C (!) 36.4 C  SpO2: 100% 100%    Last Pain:  Vitals:   05/14/17 0818  TempSrc: Tympanic         Complications: No apparent anesthesia complications

## 2017-05-14 NOTE — Progress Notes (Signed)
Dr. Byrnett into see 

## 2017-05-14 NOTE — Op Note (Signed)
Preoperative diagnosis: Thrombosed external hemorrhoid, left posterior.  Postoperative diagnosis: Same, fistula in ano.  Operative procedure: Exam under anesthesia, excision external hemorrhoid, fistulotomy.  Operating Surgeon: Hervey Ard, MD.  Anesthesia: General by LMA, Marcaine 0.  2 5%, plain 30 cc, Exparel: 20 cd local infiltration.  Estimated blood loss: 10 cc.  Clinical note: This 84 year old  woman has developed a nodular area in the left posterior aspect of the anus.  Office exam suggested possible thrombosed external hemorrhoid although there is no fluctuance.  She is brought to the operating for exam under anesthesia and planned excision.  The patient underwent general anesthesia and was ever so gently placed in dorsal lithotomy position (status post right hip replacement).  The perineum was cleansed with Betadine solution and draped.  The above-mentioned local anesthetic was infiltrated for postoperative analgesia.  The area over the mass at the 5 o'clock position was incised and no clot noted.  Thickened tissue appreciated.  A 1 x 2 cm area of tissue was excised with the skin incised with cutting current and the remaining tissue divided with the harmonic scalpel.  This uncovered a area of focal granulation tissue and a lacrimal duct probe entered the dentate line underneath the superficial anal sphincter.  This was incised with electrocautery and the base curetted to clean margins.  Tissue was sent for histology.  Hemostasis was with electrocautery.  Examination of the lower rectal mucosa was unremarkable.  Palpation of the pelvic floor was unremarkable.  The anal canal was packed with a Vaseline gauze sponge.  A dry dressing applied.  Patient was taken to recovery room in stable condition.

## 2017-05-15 ENCOUNTER — Telehealth: Payer: Self-pay | Admitting: General Surgery

## 2017-05-15 ENCOUNTER — Encounter: Payer: Self-pay | Admitting: General Surgery

## 2017-05-15 LAB — SURGICAL PATHOLOGY

## 2017-05-15 NOTE — Telephone Encounter (Signed)
Notified the patient's daughter that the pathology was benign.  The patient has had a bowel movement, but did have difficulty getting to the restroom in time.  This may be in part be secondary to the long-acting analgesic injected at the time of surgery.  Reassured this should improve.  Follow-up next week as scheduled.

## 2017-05-22 ENCOUNTER — Encounter: Payer: Self-pay | Admitting: General Surgery

## 2017-05-22 ENCOUNTER — Ambulatory Visit (INDEPENDENT_AMBULATORY_CARE_PROVIDER_SITE_OTHER): Payer: Medicare Other | Admitting: General Surgery

## 2017-05-22 VITALS — BP 150/74 | HR 71 | Resp 12 | Ht 59.0 in | Wt 126.0 lb

## 2017-05-22 DIAGNOSIS — K602 Anal fissure, unspecified: Secondary | ICD-10-CM

## 2017-05-22 DIAGNOSIS — K645 Perianal venous thrombosis: Secondary | ICD-10-CM

## 2017-05-22 NOTE — Patient Instructions (Addendum)
Call and report in 7 days. Return in two weeks.  The patient may restart her Plavix. The patient will be encouraged to make use of a daily fiber supplement. (Cookie) Sitz bath will help.

## 2017-05-22 NOTE — Progress Notes (Signed)
Patient ID: Maureen Morales, female   DOB: 1923/03/30, 84 y.o.   MRN: [id number]  Chief Complaint  Patient presents with  . Routine Post Op    HPI Maureen Morales is a 84 y.o. female here today for her post op hemorrhoid surgery done on 05/14/2017. Patient states she is still draining and painful to move her bowels.  HPI  Past Medical History:  Diagnosis Date  . Asthma without status asthmaticus    unspecified  . CAD (coronary artery disease)   . Colitis    unspecified  . Complication of anesthesia    sensitive to anesthesia  . Diabetes mellitus   . Diabetes mellitus type 2, uncomplicated ([place])   . GERD (gastroesophageal reflux disease)   . 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 Hyperlipidemia    unspecified  . Hypertension   . Myocardial infarction ([place]) 2018  . Peripheral vascular disease ([place])   . Positive H. pylori test   . Viral gastroenteritis due to [place]-like agent Nov 2012    Past Surgical History:  Procedure Laterality Date  . ABDOMINAL HYSTERECTOMY    . BLADDER SURGERY    . CARDIAC CATHETERIZATION    . CHOLECYSTECTOMY    . COLONOSCOPY     06/16/1987, 02/20/1995, 05/04/1999, 02/14/2005, 05/17/2007  . ESOPHAGOGASTRODUODENOSCOPY     05/11/1987, 06/13/1995, 10/09/1995, 05/04/1999, 01/16/2002  . FLEXIBLE SIGMOIDOSCOPY N/A 05/02/2017   Procedure: FLEXIBLE SIGMOIDOSCOPY;  Surgeon: Manya Silvas, MD;  Location: [facility] ENDOSCOPY;  Service: Endoscopy;  Laterality: N/A;  . HEMORRHOID SURGERY N/A 05/14/2017   Procedure: EXTERNAL THROMBOSED HEMORRHOIDECTOMY;  Surgeon: Robert Bellow, MD;  Location: [facility] ORS;  Service: General;  Laterality: N/A;  . HIP ARTHROPLASTY Right 02/17/2017   Procedure: ARTHROPLASTY BIPOLAR HIP (HEMIARTHROPLASTY);  Surgeon: Claud Kelp, MD;  Location: [facility] ORS;  Service: Orthopedics;  Laterality: Right;  . LEFT HEART CATH AND CORONARY ANGIOGRAPHY N/A 08/31/2016   Procedure: Left Heart Cath and Coronary Angiography;  Surgeon: Wellington Hampshire, MD;  Location: [facility];  Service: Cardiovascular;  Laterality: N/A;    Family History  Problem Relation Age of Onset  . Cancer Mother   . Breast cancer Mother 72  . Colon cancer Father   . Breast cancer Sister 62  . Colon cancer Brother   . Colon cancer Brother     Social History Social History   Tobacco Use  . Smoking status: Never Smoker  . Smokeless tobacco: Never Used  Substance Use Topics  . Alcohol use: No  . Drug use: No    Allergies  Allergen Reactions  . Aspirin Swelling  . Atropine Other (See Comments)    Reaction: unknown  . Belladonna Alkaloids Other (See Comments)    Reaction: unknown  . Codeine Other (See Comments)    Reaction: unknown  . Darvon Other (See Comments)    Reaction: unknown  . Demerol [Meperidine] Nausea And Vomiting  . Methocarbamol Other (See Comments)    Reaction: unknown  . Penicillins Other (See Comments)    Reaction: unknown Has patient had a PCN reaction causing immediate rash, facial/tongue/throat swelling, SOB or lightheadedness with hypotension: Unknown Has patient had a PCN reaction causing severe rash involving mucus membranes or skin necrosis: Unknown Has patient had a PCN reaction that required hospitalization: Unknown Has patient had a PCN reaction occurring within the last 10 years: Unknown If all of the above answers are "NO", then may proceed with Cephalosporin use.   [name]  Sulfa Antibiotics Other (See Comments)    Reaction: unknown  . Iron Other (See Comments)    High dose prescription gives her diarrhea    Current Outpatient Medications  Medication Sig Dispense Refill  . atorvastatin (LIPITOR) 40 MG tablet TAKE 1 TABLET BY MOUTH DAILY (Patient taking differently: qam) 30 tablet 3  . fenofibrate 160 MG tablet TAKE 1 TABLET BY MOUTH DAILY FOR HIGH TRIGLYCERIDES 30 tablet 2  . glucose blood (BAYER CONTOUR TEST) test strip Use as instructed 100 each 5  . HYDROcodone-acetaminophen (NORCO/VICODIN) 5-325  MG tablet Take 1 tablet by mouth every 4 (four) hours as needed for moderate pain. 12 tablet 0  . loratadine (CLARITIN) 10 MG tablet Take 10 mg daily by mouth.     . losartan (COZAAR) 100 MG tablet Take 100 mg by mouth daily.    . metoprolol succinate (TOPROL-XL) 50 MG 24 hr tablet TAKE 1 TABLET BY MOUTH DAILY 30 tablet 3  . nitroGLYCERIN (NITROSTAT) 0.4 MG SL tablet Place 1 tablet (0.4 mg total) under the tongue every 5 (five) minutes as needed for chest pain. (Patient not taking: Reported on 05/11/2017) 10 tablet 0  . sertraline (ZOLOFT) 50 MG tablet Take 50 mg by mouth daily.    . vitamin C (ASCORBIC ACID) 500 MG tablet Take 1 tablet (500 mg total) by mouth 2 (two) times daily. 60 tablet 2   No current facility-administered medications for this visit.     Review of Systems Review of Systems  Constitutional: Negative.   Respiratory: Negative.   Cardiovascular: Negative.     Blood pressure (!) 150/74, pulse 71, resp. rate 12, height 4\' 11"  (1.499 m), weight 126 lb (57.2 kg).  Physical Exam Physical Exam  Constitutional: She is oriented to person, place, and time. She appears well-developed and well-nourished.  Genitourinary:     Neurological: She is alert and oriented to person, place, and time.  Skin: Skin is warm and dry.    Data Reviewed May 14, 2017 surgical specimen: A. HEMORRHOID, LEFT POSTERIOR AND ANAL FISTULA; HEMORRHOIDECTOMY:  - FRAGMENTS OF SQUAMOUS LINED MUCOSA WITH UNDERLYING INFLAMED  GRANULATION TISSUE AND ABSCESS, COMPATIBLE WITH CLINICAL IMPRESSION OF  FISTULA.  - NEGATIVE FOR MALIGNANCY.   Comment:  The specimen was entirely submitted for microscopic examination and  consists entirely of inflammatory process. Hemorrhoidal tissue is not  identified.   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  No evidence of malignancy.      Plan    Careful pelvic floor exam was completed during the procedure and no evidence of  an abnormality to correlate with her June 2018 CT scan noted.  We will make use of a fiber supplement daily, and even though she eats a fairly high-fiber diet the hopes of this will allow her a little bit better bowel control.  She was having incontinence of stool prior to resection and this was thought secondary to the mass-effect of the inflammatory process.  This difficulty with continence persists. Sits baths to help with perineal cleansing.    Call and report in 7 days. Return in two weeks.    HPI, Physical Exam, Assessment and Plan have been scribed under the direction and in the presence of Hervey Ard, MD.  Gaspar Cola, CMA  I have completed the exam and reviewed the above documentation for accuracy and completeness.  I agree with the above.  Haematologist has been used and any errors in dictation or transcription are unintentional.  Hervey Ard, M.D., F.A.C.S.   Maureen Morales 05/24/2017, 6:34 AM

## 2017-05-24 DIAGNOSIS — H903 Sensorineural hearing loss, bilateral: Secondary | ICD-10-CM | POA: Diagnosis not present

## 2017-05-24 DIAGNOSIS — H6123 Impacted cerumen, bilateral: Secondary | ICD-10-CM | POA: Diagnosis not present

## 2017-05-25 DIAGNOSIS — H0013 Chalazion right eye, unspecified eyelid: Secondary | ICD-10-CM | POA: Diagnosis not present

## 2017-05-26 ENCOUNTER — Emergency Department (HOSPITAL_COMMUNITY): Payer: Medicare Other

## 2017-05-26 ENCOUNTER — Encounter (HOSPITAL_COMMUNITY): Payer: Self-pay | Admitting: Emergency Medicine

## 2017-05-26 ENCOUNTER — Inpatient Hospital Stay (HOSPITAL_COMMUNITY)
Admission: EM | Admit: 2017-05-26 | Discharge: 2017-05-30 | DRG: 481 | Disposition: A | Discharge status: Rehabilitation Facility | Payer: Medicare Other | Attending: Internal Medicine | Admitting: Internal Medicine

## 2017-05-26 ENCOUNTER — Inpatient Hospital Stay (HOSPITAL_COMMUNITY): Payer: Medicare Other

## 2017-05-26 DIAGNOSIS — Z823 Family history of stroke: Secondary | ICD-10-CM

## 2017-05-26 DIAGNOSIS — S79911A Unspecified injury of right hip, initial encounter: Secondary | ICD-10-CM | POA: Diagnosis not present

## 2017-05-26 DIAGNOSIS — M9701XD Periprosthetic fracture around internal prosthetic right hip joint, subsequent encounter: Secondary | ICD-10-CM | POA: Diagnosis not present

## 2017-05-26 DIAGNOSIS — R609 Edema, unspecified: Secondary | ICD-10-CM | POA: Diagnosis not present

## 2017-05-26 DIAGNOSIS — L89613 Pressure ulcer of right heel, stage 3: Secondary | ICD-10-CM

## 2017-05-26 DIAGNOSIS — H019 Unspecified inflammation of eyelid: Secondary | ICD-10-CM | POA: Diagnosis present

## 2017-05-26 DIAGNOSIS — G8918 Other acute postprocedural pain: Secondary | ICD-10-CM | POA: Diagnosis not present

## 2017-05-26 DIAGNOSIS — I130 Hypertensive heart and chronic kidney disease with heart failure and stage 1 through stage 4 chronic kidney disease, or unspecified chronic kidney disease: Secondary | ICD-10-CM | POA: Diagnosis present

## 2017-05-26 DIAGNOSIS — Z885 Allergy status to narcotic agent status: Secondary | ICD-10-CM

## 2017-05-26 DIAGNOSIS — N179 Acute kidney failure, unspecified: Secondary | ICD-10-CM | POA: Diagnosis not present

## 2017-05-26 DIAGNOSIS — S72002A Fracture of unspecified part of neck of left femur, initial encounter for closed fracture: Secondary | ICD-10-CM | POA: Diagnosis not present

## 2017-05-26 DIAGNOSIS — S72143D Displaced intertrochanteric fracture of unspecified femur, subsequent encounter for closed fracture with routine healing: Secondary | ICD-10-CM | POA: Diagnosis not present

## 2017-05-26 DIAGNOSIS — D62 Acute posthemorrhagic anemia: Secondary | ICD-10-CM

## 2017-05-26 DIAGNOSIS — R509 Fever, unspecified: Secondary | ICD-10-CM

## 2017-05-26 DIAGNOSIS — I361 Nonrheumatic tricuspid (valve) insufficiency: Secondary | ICD-10-CM | POA: Diagnosis not present

## 2017-05-26 DIAGNOSIS — S72331K Displaced oblique fracture of shaft of right femur, subsequent encounter for closed fracture with nonunion: Secondary | ICD-10-CM | POA: Diagnosis not present

## 2017-05-26 DIAGNOSIS — E785 Hyperlipidemia, unspecified: Secondary | ICD-10-CM | POA: Diagnosis present

## 2017-05-26 DIAGNOSIS — I252 Old myocardial infarction: Secondary | ICD-10-CM | POA: Diagnosis not present

## 2017-05-26 DIAGNOSIS — E1165 Type 2 diabetes mellitus with hyperglycemia: Secondary | ICD-10-CM | POA: Diagnosis present

## 2017-05-26 DIAGNOSIS — R140 Abdominal distension (gaseous): Secondary | ICD-10-CM | POA: Diagnosis not present

## 2017-05-26 DIAGNOSIS — M9701XA Periprosthetic fracture around internal prosthetic right hip joint, initial encounter: Secondary | ICD-10-CM | POA: Diagnosis present

## 2017-05-26 DIAGNOSIS — S0003XD Contusion of scalp, subsequent encounter: Secondary | ICD-10-CM | POA: Diagnosis not present

## 2017-05-26 DIAGNOSIS — R296 Repeated falls: Secondary | ICD-10-CM | POA: Diagnosis present

## 2017-05-26 DIAGNOSIS — S72001A Fracture of unspecified part of neck of right femur, initial encounter for closed fracture: Secondary | ICD-10-CM | POA: Diagnosis not present

## 2017-05-26 DIAGNOSIS — N183 Chronic kidney disease, stage 3 (moderate): Secondary | ICD-10-CM | POA: Diagnosis present

## 2017-05-26 DIAGNOSIS — Z886 Allergy status to analgesic agent status: Secondary | ICD-10-CM | POA: Diagnosis not present

## 2017-05-26 DIAGNOSIS — I071 Rheumatic tricuspid insufficiency: Secondary | ICD-10-CM | POA: Diagnosis present

## 2017-05-26 DIAGNOSIS — Z7984 Long term (current) use of oral hypoglycemic drugs: Secondary | ICD-10-CM

## 2017-05-26 DIAGNOSIS — M25511 Pain in right shoulder: Secondary | ICD-10-CM | POA: Diagnosis present

## 2017-05-26 DIAGNOSIS — M9701XS Periprosthetic fracture around internal prosthetic right hip joint, sequela: Secondary | ICD-10-CM | POA: Diagnosis not present

## 2017-05-26 DIAGNOSIS — M25551 Pain in right hip: Secondary | ICD-10-CM | POA: Diagnosis not present

## 2017-05-26 DIAGNOSIS — E46 Unspecified protein-calorie malnutrition: Secondary | ICD-10-CM | POA: Diagnosis not present

## 2017-05-26 DIAGNOSIS — Z419 Encounter for procedure for purposes other than remedying health state, unspecified: Secondary | ICD-10-CM

## 2017-05-26 DIAGNOSIS — D649 Anemia, unspecified: Secondary | ICD-10-CM | POA: Diagnosis present

## 2017-05-26 DIAGNOSIS — S72001D Fracture of unspecified part of neck of right femur, subsequent encounter for closed fracture with routine healing: Secondary | ICD-10-CM | POA: Diagnosis not present

## 2017-05-26 DIAGNOSIS — I272 Pulmonary hypertension, unspecified: Secondary | ICD-10-CM | POA: Diagnosis not present

## 2017-05-26 DIAGNOSIS — I2510 Atherosclerotic heart disease of native coronary artery without angina pectoris: Secondary | ICD-10-CM | POA: Diagnosis present

## 2017-05-26 DIAGNOSIS — E119 Type 2 diabetes mellitus without complications: Secondary | ICD-10-CM | POA: Diagnosis not present

## 2017-05-26 DIAGNOSIS — W010XXA Fall on same level from slipping, tripping and stumbling without subsequent striking against object, initial encounter: Secondary | ICD-10-CM | POA: Diagnosis present

## 2017-05-26 DIAGNOSIS — I082 Rheumatic disorders of both aortic and tricuspid valves: Secondary | ICD-10-CM | POA: Diagnosis not present

## 2017-05-26 DIAGNOSIS — K603 Anal fistula, unspecified: Secondary | ICD-10-CM

## 2017-05-26 DIAGNOSIS — L97419 Non-pressure chronic ulcer of right heel and midfoot with unspecified severity: Secondary | ICD-10-CM | POA: Diagnosis present

## 2017-05-26 DIAGNOSIS — F329 Major depressive disorder, single episode, unspecified: Secondary | ICD-10-CM | POA: Diagnosis not present

## 2017-05-26 DIAGNOSIS — S0101XA Laceration without foreign body of scalp, initial encounter: Secondary | ICD-10-CM | POA: Diagnosis present

## 2017-05-26 DIAGNOSIS — S72001S Fracture of unspecified part of neck of right femur, sequela: Secondary | ICD-10-CM | POA: Diagnosis not present

## 2017-05-26 DIAGNOSIS — I10 Essential (primary) hypertension: Secondary | ICD-10-CM | POA: Diagnosis present

## 2017-05-26 DIAGNOSIS — F32A Depression, unspecified: Secondary | ICD-10-CM | POA: Diagnosis present

## 2017-05-26 DIAGNOSIS — Z96649 Presence of unspecified artificial hip joint: Secondary | ICD-10-CM | POA: Diagnosis not present

## 2017-05-26 DIAGNOSIS — M978XXA Periprosthetic fracture around other internal prosthetic joint, initial encounter: Secondary | ICD-10-CM | POA: Insufficient documentation

## 2017-05-26 DIAGNOSIS — Z800 Family history of malignant neoplasm of digestive organs: Secondary | ICD-10-CM

## 2017-05-26 DIAGNOSIS — S199XXA Unspecified injury of neck, initial encounter: Secondary | ICD-10-CM | POA: Diagnosis not present

## 2017-05-26 DIAGNOSIS — W19XXXA Unspecified fall, initial encounter: Secondary | ICD-10-CM | POA: Diagnosis not present

## 2017-05-26 DIAGNOSIS — Z9181 History of falling: Secondary | ICD-10-CM | POA: Diagnosis not present

## 2017-05-26 DIAGNOSIS — N2889 Other specified disorders of kidney and ureter: Secondary | ICD-10-CM | POA: Diagnosis not present

## 2017-05-26 DIAGNOSIS — L89610 Pressure ulcer of right heel, unstageable: Secondary | ICD-10-CM | POA: Diagnosis not present

## 2017-05-26 DIAGNOSIS — J4520 Mild intermittent asthma, uncomplicated: Secondary | ICD-10-CM | POA: Diagnosis not present

## 2017-05-26 DIAGNOSIS — I350 Nonrheumatic aortic (valve) stenosis: Secondary | ICD-10-CM | POA: Diagnosis not present

## 2017-05-26 DIAGNOSIS — E1151 Type 2 diabetes mellitus with diabetic peripheral angiopathy without gangrene: Secondary | ICD-10-CM | POA: Diagnosis present

## 2017-05-26 DIAGNOSIS — Z96641 Presence of right artificial hip joint: Secondary | ICD-10-CM

## 2017-05-26 DIAGNOSIS — S72301A Unspecified fracture of shaft of right femur, initial encounter for closed fracture: Principal | ICD-10-CM | POA: Diagnosis present

## 2017-05-26 DIAGNOSIS — H9192 Unspecified hearing loss, left ear: Secondary | ICD-10-CM | POA: Diagnosis present

## 2017-05-26 DIAGNOSIS — R911 Solitary pulmonary nodule: Secondary | ICD-10-CM | POA: Diagnosis present

## 2017-05-26 DIAGNOSIS — W19XXXD Unspecified fall, subsequent encounter: Secondary | ICD-10-CM | POA: Diagnosis not present

## 2017-05-26 DIAGNOSIS — S7290XA Unspecified fracture of unspecified femur, initial encounter for closed fracture: Secondary | ICD-10-CM

## 2017-05-26 DIAGNOSIS — Y92009 Unspecified place in unspecified non-institutional (private) residence as the place of occurrence of the external cause: Secondary | ICD-10-CM

## 2017-05-26 DIAGNOSIS — I5032 Chronic diastolic (congestive) heart failure: Secondary | ICD-10-CM | POA: Diagnosis not present

## 2017-05-26 DIAGNOSIS — E876 Hypokalemia: Secondary | ICD-10-CM | POA: Diagnosis not present

## 2017-05-26 DIAGNOSIS — I169 Hypertensive crisis, unspecified: Secondary | ICD-10-CM | POA: Diagnosis not present

## 2017-05-26 DIAGNOSIS — E1122 Type 2 diabetes mellitus with diabetic chronic kidney disease: Secondary | ICD-10-CM | POA: Diagnosis present

## 2017-05-26 DIAGNOSIS — Z888 Allergy status to other drugs, medicaments and biological substances status: Secondary | ICD-10-CM | POA: Diagnosis not present

## 2017-05-26 DIAGNOSIS — Z880 Allergy status to penicillin: Secondary | ICD-10-CM | POA: Diagnosis not present

## 2017-05-26 DIAGNOSIS — K6289 Other specified diseases of anus and rectum: Secondary | ICD-10-CM | POA: Diagnosis present

## 2017-05-26 DIAGNOSIS — S0990XA Unspecified injury of head, initial encounter: Secondary | ICD-10-CM | POA: Diagnosis not present

## 2017-05-26 DIAGNOSIS — Z803 Family history of malignant neoplasm of breast: Secondary | ICD-10-CM

## 2017-05-26 DIAGNOSIS — E8809 Other disorders of plasma-protein metabolism, not elsewhere classified: Secondary | ICD-10-CM | POA: Diagnosis present

## 2017-05-26 DIAGNOSIS — S72301D Unspecified fracture of shaft of right femur, subsequent encounter for closed fracture with routine healing: Secondary | ICD-10-CM | POA: Diagnosis not present

## 2017-05-26 DIAGNOSIS — J45909 Unspecified asthma, uncomplicated: Secondary | ICD-10-CM | POA: Diagnosis present

## 2017-05-26 DIAGNOSIS — R0989 Other specified symptoms and signs involving the circulatory and respiratory systems: Secondary | ICD-10-CM | POA: Diagnosis not present

## 2017-05-26 HISTORY — DX: Depression, unspecified: F32.A

## 2017-05-26 HISTORY — DX: Major depressive disorder, single episode, unspecified: F32.9

## 2017-05-26 HISTORY — DX: Fracture of unspecified part of neck of right femur, initial encounter for closed fracture: S72.001A

## 2017-05-26 HISTORY — DX: Essential (primary) hypertension: I10

## 2017-05-26 LAB — CBC WITH DIFFERENTIAL/PLATELET
Basophils Absolute: 0 10*3/uL (ref 0.0–0.1)
Basophils Relative: 0 %
Eosinophils Absolute: 0.5 10*3/uL (ref 0.0–0.7)
Eosinophils Relative: 4 %
HCT: 29.4 % — ABNORMAL LOW (ref 36.0–46.0)
Hemoglobin: 9.6 g/dL — ABNORMAL LOW (ref 12.0–15.0)
Lymphocytes Relative: 30 %
Lymphs Abs: 3.4 10*3/uL (ref 0.7–4.0)
MCH: 27.7 pg (ref 26.0–34.0)
MCHC: 32.7 g/dL (ref 30.0–36.0)
MCV: 84.7 fL (ref 78.0–100.0)
Monocytes Absolute: 0.7 10*3/uL (ref 0.1–1.0)
Monocytes Relative: 6 %
Neutro Abs: 6.8 10*3/uL (ref 1.7–7.7)
Neutrophils Relative %: 60 %
Platelets: 320 10*3/uL (ref 150–400)
RBC: 3.47 MIL/uL — ABNORMAL LOW (ref 3.87–5.11)
RDW: 14.9 % (ref 11.5–15.5)
WBC: 11.4 10*3/uL — ABNORMAL HIGH (ref 4.0–10.5)

## 2017-05-26 LAB — ABO/RH: ABO/RH(D): O NEG

## 2017-05-26 LAB — APTT: APTT: 30 s (ref 24–36)

## 2017-05-26 LAB — BASIC METABOLIC PANEL
Anion gap: 10 (ref 5–15)
BUN: 37 mg/dL — ABNORMAL HIGH (ref 6–20)
CO2: 22 mmol/L (ref 22–32)
Calcium: 9.1 mg/dL (ref 8.9–10.3)
Chloride: 107 mmol/L (ref 101–111)
Creatinine, Ser: 1 mg/dL (ref 0.44–1.00)
GFR calc Af Amer: 55 mL/min — ABNORMAL LOW (ref >60)
GFR calc non Af Amer: 47 mL/min — ABNORMAL LOW (ref >60)
Glucose, Bld: 319 mg/dL — ABNORMAL HIGH (ref 65–99)
Potassium: 3.6 mmol/L (ref 3.5–5.1)
Sodium: 139 mmol/L (ref 135–145)

## 2017-05-26 LAB — PROTIME-INR
INR: 1.26
PROTHROMBIN TIME: 15.7 s — AB (ref 11.4–15.2)

## 2017-05-26 LAB — I-STAT CG4 LACTIC ACID, ED: Lactic Acid, Venous: 1.45 mmol/L (ref 0.5–1.9)

## 2017-05-26 MED ORDER — ATORVASTATIN CALCIUM 40 MG PO TABS
40.0000 mg | ORAL_TABLET | Freq: Every day | ORAL | Status: DC
  Administered 2017-05-28 – 2017-05-30 (×3): 40 mg via ORAL
  Filled 2017-05-26 (×3): qty 1

## 2017-05-26 MED ORDER — SODIUM CHLORIDE 0.9 % IV SOLN
INTRAVENOUS | Status: DC
  Administered 2017-05-26 – 2017-05-28 (×4): via INTRAVENOUS
  Administered 2017-05-29: 75 mL/h via INTRAVENOUS

## 2017-05-26 MED ORDER — ONDANSETRON HCL 4 MG/2ML IJ SOLN: 4.0000 mg | Freq: Three times a day (TID) | INTRAMUSCULAR | Status: DC | PRN

## 2017-05-26 MED ORDER — LOSARTAN POTASSIUM 50 MG PO TABS
100.0000 mg | ORAL_TABLET | Freq: Every day | ORAL | Status: DC
  Administered 2017-05-28 – 2017-05-30 (×3): 100 mg via ORAL
  Filled 2017-05-26 (×3): qty 2

## 2017-05-26 MED ORDER — LORATADINE 10 MG PO TABS
10.0000 mg | ORAL_TABLET | Freq: Every day | ORAL | Status: DC
  Administered 2017-05-28 – 2017-05-30 (×3): 10 mg via ORAL
  Filled 2017-05-26 (×3): qty 1

## 2017-05-26 MED ORDER — LORAZEPAM 2 MG/ML IJ SOLN
1.0000 mg | Freq: Once | INTRAMUSCULAR | Status: AC
  Administered 2017-05-26: 1 mg via INTRAVENOUS
  Filled 2017-05-26: qty 1

## 2017-05-26 MED ORDER — POLYETHYLENE GLYCOL 3350 17 G PO PACK
17.0000 g | PACK | Freq: Every day | ORAL | Status: DC | PRN
  Administered 2017-05-29: 17 g via ORAL
  Filled 2017-05-26: qty 1

## 2017-05-26 MED ORDER — AZITHROMYCIN 250 MG PO TABS
250.0000 mg | ORAL_TABLET | Freq: Every day | ORAL | Status: AC
  Administered 2017-05-28 – 2017-05-29 (×2): 250 mg via ORAL
  Filled 2017-05-26 (×3): qty 1

## 2017-05-26 MED ORDER — VITAMIN C 500 MG PO TABS
500.0000 mg | ORAL_TABLET | Freq: Two times a day (BID) | ORAL | Status: DC
  Administered 2017-05-27 – 2017-05-30 (×5): 500 mg via ORAL
  Filled 2017-05-26 (×5): qty 1

## 2017-05-26 MED ORDER — HYDRALAZINE HCL 20 MG/ML IJ SOLN: 5.0000 mg | INTRAMUSCULAR | Status: DC | PRN

## 2017-05-26 MED ORDER — METOPROLOL SUCCINATE ER 50 MG PO TB24
50.0000 mg | ORAL_TABLET | Freq: Every day | ORAL | Status: DC
  Administered 2017-05-28 – 2017-05-30 (×3): 50 mg via ORAL
  Filled 2017-05-26 (×3): qty 1

## 2017-05-26 MED ORDER — MORPHINE SULFATE (PF) 4 MG/ML IV SOLN
0.5000 mg | INTRAVENOUS | Status: DC | PRN
  Administered 2017-05-27: 0.52 mg via INTRAVENOUS
  Filled 2017-05-26: qty 1

## 2017-05-26 MED ORDER — ONDANSETRON HCL 4 MG/2ML IJ SOLN
INTRAMUSCULAR | Status: AC
  Administered 2017-05-26: 4 mg
  Filled 2017-05-26: qty 2

## 2017-05-26 MED ORDER — FENOFIBRATE 160 MG PO TABS
160.0000 mg | ORAL_TABLET | Freq: Every day | ORAL | Status: DC
Start: 2017-05-27 — End: 2017-05-30
  Administered 2017-05-28 – 2017-05-30 (×3): 160 mg via ORAL
  Filled 2017-05-26 (×3): qty 1

## 2017-05-26 MED ORDER — MORPHINE SULFATE (PF) 4 MG/ML IV SOLN
INTRAVENOUS | Status: AC
  Administered 2017-05-26: 4 mg
  Filled 2017-05-26: qty 1

## 2017-05-26 MED ORDER — FENTANYL CITRATE (PF) 100 MCG/2ML IJ SOLN
INTRAMUSCULAR | Status: AC
  Filled 2017-05-26: qty 2

## 2017-05-26 MED ORDER — IOPAMIDOL (ISOVUE-300) INJECTION 61%
INTRAVENOUS | Status: AC
  Administered 2017-05-26: 100 mL
  Filled 2017-05-26: qty 100

## 2017-05-26 MED ORDER — HYDROMORPHONE HCL 1 MG/ML IJ SOLN
INTRAMUSCULAR | Status: AC
  Filled 2017-05-26: qty 1

## 2017-05-26 MED ORDER — ZOLPIDEM TARTRATE 5 MG PO TABS: 5.0000 mg | ORAL_TABLET | Freq: Every evening | ORAL | Status: DC | PRN

## 2017-05-26 MED ORDER — ACETAMINOPHEN 325 MG PO TABS: 650.0000 mg | ORAL_TABLET | Freq: Four times a day (QID) | ORAL | Status: DC | PRN

## 2017-05-26 MED ORDER — OXYCODONE-ACETAMINOPHEN 5-325 MG PO TABS
1.0000 | ORAL_TABLET | ORAL | Status: DC | PRN
  Administered 2017-05-27 (×2): 1 via ORAL
  Filled 2017-05-26 (×4): qty 1

## 2017-05-26 MED ORDER — HYDROMORPHONE HCL 1 MG/ML IJ SOLN
0.5000 mg | Freq: Once | INTRAMUSCULAR | Status: AC
  Administered 2017-05-26: 0.5 mg via INTRAVENOUS

## 2017-05-26 MED ORDER — METHOCARBAMOL 500 MG PO TABS
500.0000 mg | ORAL_TABLET | Freq: Three times a day (TID) | ORAL | Status: DC | PRN
  Administered 2017-05-27: 500 mg via ORAL
  Filled 2017-05-26 (×2): qty 1

## 2017-05-26 MED ORDER — SERTRALINE HCL 50 MG PO TABS
50.0000 mg | ORAL_TABLET | Freq: Every day | ORAL | Status: DC
Start: 2017-05-27 — End: 2017-05-30
  Administered 2017-05-28 – 2017-05-30 (×3): 50 mg via ORAL
  Filled 2017-05-26 (×3): qty 1

## 2017-05-26 MED ORDER — FENTANYL CITRATE (PF) 100 MCG/2ML IJ SOLN
50.0000 ug | INTRAMUSCULAR | Status: AC | PRN
  Administered 2017-05-26 (×2): 50 ug via INTRAVENOUS
  Filled 2017-05-26: qty 2

## 2017-05-26 NOTE — Progress Notes (Signed)
Orthopedic Tech Progress Note Patient Details:  Maureen Morales [birth year] [id number] Patient exceeds age restrictions for overhead frame. Patient ID: Maureen Morales, female   DOB: 11-04-1922, 84 y.o.   MRN: [id number]   Braulio Bosch 05/26/2017, 10:15 PM

## 2017-05-26 NOTE — ED Notes (Signed)
Attempted to give report 

## 2017-05-26 NOTE — ED Notes (Signed)
Patient transported to X-ray 

## 2017-05-26 NOTE — ED Notes (Signed)
Sent down label for

## 2017-05-26 NOTE — ED Notes (Signed)
Sent label to main lab for inr and aptt blood test.

## 2017-05-26 NOTE — H&P (Addendum)
History and Physical    [name]:[id number] DOB: [birth year] DOA: 05/26/2017  Referring MD/NP/PA:   PCP: Patient, No Pcp Per   Patient coming from:  The patient is coming from home.  At baseline, pt is independent for most of ADL.   Chief Complaint: fall and right hip pain.  HPI: Maureen Morales is a 84 y.o. female with medical history significant of hypertension, hyperlipidemia, depression, CAD (had heart attack 02/11/17), who presents with fall and right hip pain.  Per patient's daughter, patient fell while she was walking using walker, sneezed, lost her balance and fell at about 6:00 PM. Dear pain in the right hip, and the mild pain in the right shoulder. She had a sclp laceration and hematoma in the right side of her head. The right hip pain is constant, sharp, severe, nonradiating. No leg numbness.  Patient did not have loss of consciousness. No prodromal symptoms before fall. Patient does not have unilateral weakness, numbness or tingling to extremities. Patient denies nausea, vomiting, diarrhea or abdominal pain, but her abdominal is severely distended. Patient had normal bowel movement today. Denies symptoms of UTI. No fever or chills. Daughter states that the patient is taking azithromycin for right eyelid infection in the past two days per her ophthalmologist (3 more doses left). Per pt's daughter, pt had right hip replacement 2 months ago when his Plavix was on hold.  ED Course: pt was found to have WBC 11.4, creatinine 1.0, no fever, bradycardia, no tachypnea, oxygen saturation 91-99% on room air. CT ofhead and neck negative for acute intracranial abnormalities or bony fractures, but showed 4 mm of lung nodule.  X-ray showed right hip hemiarthroplasty, with acute periprosthetic fracture in the proximal right femur with displacement. Pt is admitted to [facility] bed as inpatient. Orthopedic surgeon, Dr. Rush Farmer was consulted.  Review of Systems:   General: no fevers, chills, no  body weight gain, Has fatigue HEENT: no blurry vision, hearing changes or sore throat Respiratory: no dyspnea, coughing, wheezing CV: no chest pain, no palpitations GI: no nausea, vomiting, abdominal pain, diarrhea, constipation. Has abdominal distention.  GU: no dysuria, burning on urination, increased urinary frequency, hematuria  Ext: no leg edema Neuro: no unilateral weakness, numbness, or tingling, no vision change or hearing loss Skin: no rash. Has scalp laceration MSK: has right hip and right shoulder pain Heme: No easy bruising.  Travel history: No recent long distant travel.  Allergy:  Allergies  Allergen Reactions  . Asa [Aspirin] Anaphylaxis  . Darvon [Propoxyphene] Nausea And Vomiting  . Ferrous Sulfate Diarrhea  . Meperidine And Related   . Penicillins Other (See Comments)    Pt and family unsure of details    Past Medical History:  Diagnosis Date  . CAD (coronary artery disease)   . Depression   . Essential hypertension   . HLD (hyperlipidemia)     Past Surgical History:  Procedure Laterality Date  . TOTAL HIP ARTHROPLASTY Right   . uterian prolapse      Social History:  reports that  has never smoked. She does not have any smokeless tobacco history on file. She reports that she does not drink alcohol or use drugs.  Family History:  Family History  Problem Relation Age of Onset  . Breast cancer Mother   . Colon cancer Father   . Stroke Sister      Prior to Admission medications   Medication Sig Start Date End Date Taking? Authorizing Provider  atorvastatin (LIPITOR) 40  MG tablet Take 40 mg by mouth daily.   Yes [provider]  fenofibrate 160 MG tablet Take 160 mg by mouth daily.   Yes [provider]  loratadine (CLARITIN) 10 MG tablet Take 10 mg by mouth daily.   Yes [provider]  losartan (COZAAR) 100 MG tablet Take 100 mg by mouth daily.   Yes [provider]  metoprolol succinate (TOPROL-XL) 50 MG 24 hr  tablet Take 50 mg by mouth daily. Take with or immediately following a meal.   Yes [provider]  sertraline (ZOLOFT) 50 MG tablet Take 50 mg by mouth daily.   Yes [provider]  vitamin C (ASCORBIC ACID) 500 MG tablet Take 500 mg by mouth 2 (two) times daily.   Yes [provider]    Physical Exam: Vitals:   05/26/17 2045 05/26/17 2100 05/26/17 2115 05/26/17 2130  BP: (!) 115/55 124/73 (!) 144/55 (!) 136/50  Pulse: (!) 58 63 64 66  Resp: 15 (!) 9 11 10   SpO2: 98% 91% 100% 99%   General: Not in acute distress HEENT:       Eyes: PERRL, EOMI, no scleral icterus.       ENT: No discharge from the ears and nose, no pharynx injection, no tonsillar enlargement.        Neck: No JVD, no bruit, no mass felt. Heme: No neck lymph node enlargement. Cardiac: S1/S2, RRR, No murmurs, No gallops or rubs. Respiratory: No rales, wheezing, rhonchi or rubs. GI: distended, has mild diffused tenderness, no rebound pain, no organomegaly, BS present. GU: No hematuria Ext: No pitting leg edema bilaterally. 2+DP/PT pulse bilaterally. Musculoskeletal: has right hip and right shoulder tenderness Skin: No rashes. Has a small scalp laceration and hematoma in the right side of head. Neuro: Alert, oriented X3, cranial nerves II-XII grossly intact, moves all extremities. Psych: Patient is not psychotic, no suicidal or hemocidal ideation.  Labs on Admission: I have personally reviewed following labs and imaging studies  CBC: Recent Labs  Lab 05/26/17 1915  WBC 11.4*  NEUTROABS 6.8  HGB 9.6*  HCT 29.4*  MCV 84.7  PLT 948   Basic Metabolic Panel: Recent Labs  Lab 05/26/17 1915  NA 139  K 3.6  CL 107  CO2 22  GLUCOSE 319*  BUN 37*  CREATININE 1.00  CALCIUM 9.1   GFR: CrCl cannot be calculated (Unknown ideal weight.). Liver Function Tests: No results for input(s): AST, ALT, ALKPHOS, BILITOT, PROT, ALBUMIN in the last 168 hours. No results for input(s): LIPASE,  AMYLASE in the last 168 hours. No results for input(s): AMMONIA in the last 168 hours. Coagulation Profile: No results for input(s): INR, PROTIME in the last 168 hours. Cardiac Enzymes: No results for input(s): CKTOTAL, CKMB, CKMBINDEX, TROPONINI in the last 168 hours. BNP (last 3 results) No results for input(s): PROBNP in the last 8760 hours. HbA1C: No results for input(s): HGBA1C in the last 72 hours. CBG: No results for input(s): GLUCAP in the last 168 hours. Lipid Profile: No results for input(s): CHOL, HDL, LDLCALC, TRIG, CHOLHDL, LDLDIRECT in the last 72 hours. Thyroid Function Tests: No results for input(s): TSH, T4TOTAL, FREET4, T3FREE, THYROIDAB in the last 72 hours. Anemia Panel: No results for input(s): VITAMINB12, FOLATE, FERRITIN, TIBC, IRON, RETICCTPCT in the last 72 hours. Urine analysis: No results found for: COLORURINE, APPEARANCEUR, LABSPEC, PHURINE, GLUCOSEU, HGBUR, BILIRUBINUR, KETONESUR, PROTEINUR, UROBILINOGEN, NITRITE, LEUKOCYTESUR Sepsis Labs: @LABRCNTIP (procalcitonin:4,lacticidven:4) )No results found for this or any previous visit (from  the past 240 hour(s)).   Radiological Exams on Admission: Ct Head Wo Contrast  Result Date: 05/26/2017 CLINICAL DATA:  Sneezed then fell to the ground, no loss of consciousness, possible small skull deformity of back of head, RIGHT hip deformity, initial encounter EXAM: CT HEAD WITHOUT CONTRAST CT CERVICAL SPINE WITHOUT CONTRAST TECHNIQUE: Multidetector CT imaging of the head and cervical spine was performed following the standard protocol without intravenous contrast. Multiplanar CT image reconstructions of the cervical spine were also generated. COMPARISON:  None FINDINGS: CT HEAD FINDINGS Brain: Mild generalized atrophy. Normal ventricular morphology. No midline shift or mass effect. Mild small vessel chronic ischemic changes of periventricular white matter in the frontal lobes. No intracranial hemorrhage, mass lesion, or  evidence of acute infarction. No extra-axial fluid collections. Vascular: Extensive atherosclerotic calcification of internal carotid arteries at skull base Skull: Calvaria intact.  RIGHT parietooccipital scalp hematoma. Sinuses/Orbits: Visualized paranasal sinuses and mastoid air cells clear Other: N/A CT CERVICAL SPINE FINDINGS Alignment: Normal Skull base and vertebrae: Osseous demineralization. Visualized skull base intact. Vertebral body heights maintained without fracture or bone destruction. Multilevel disc space narrowing and endplate spur formation. Multilevel facet degenerative changes. Encroachment upon RIGHT cervical neural foramina at C4-C5 by facet hypertrophy. Soft tissues and spinal canal: Prevertebral soft tissues normal thickness. Remaining visualized cervical soft tissues unremarkable. Disc levels:  No additional abnormalities Upper chest: Emphysematous changes at lung apices. 4 mm RIGHT upper lobe nodule image 80. Minimal scattered interseptal thickening. Other: Atherosclerotic calcifications of the carotid bifurcations, proximal great vessels and aortic arch. IMPRESSION: Atrophy with mild small vessel chronic ischemic changes of deep cerebral white matter. No acute intracranial abnormalities. Degenerative disc and facet disease changes of the cervical spine. No acute cervical spine abnormalities. 4 mm RIGHT upper lobe pulmonary nodule, recommendation below. No follow-up needed if patient is low-risk. Non-contrast chest CT can be considered in 12 months if patient is high-risk. This recommendation follows the consensus statement: Guidelines for Management of Incidental Pulmonary Nodules Detected on CT Images: From the Fleischner Society 2017; Radiology 2017; 284:228-243. Electronically Signed   By: Lavonia Dana M.D.   On: 05/26/2017 20:22   Ct Cervical Spine Wo Contrast  Result Date: 05/26/2017 CLINICAL DATA:  Sneezed then fell to the ground, no loss of consciousness, possible small skull  deformity of back of head, RIGHT hip deformity, initial encounter EXAM: CT HEAD WITHOUT CONTRAST CT CERVICAL SPINE WITHOUT CONTRAST TECHNIQUE: Multidetector CT imaging of the head and cervical spine was performed following the standard protocol without intravenous contrast. Multiplanar CT image reconstructions of the cervical spine were also generated. COMPARISON:  None FINDINGS: CT HEAD FINDINGS Brain: Mild generalized atrophy. Normal ventricular morphology. No midline shift or mass effect. Mild small vessel chronic ischemic changes of periventricular white matter in the frontal lobes. No intracranial hemorrhage, mass lesion, or evidence of acute infarction. No extra-axial fluid collections. Vascular: Extensive atherosclerotic calcification of internal carotid arteries at skull base Skull: Calvaria intact.  RIGHT parietooccipital scalp hematoma. Sinuses/Orbits: Visualized paranasal sinuses and mastoid air cells clear Other: N/A CT CERVICAL SPINE FINDINGS Alignment: Normal Skull base and vertebrae: Osseous demineralization. Visualized skull base intact. Vertebral body heights maintained without fracture or bone destruction. Multilevel disc space narrowing and endplate spur formation. Multilevel facet degenerative changes. Encroachment upon RIGHT cervical neural foramina at C4-C5 by facet hypertrophy. Soft tissues and spinal canal: Prevertebral soft tissues normal thickness. Remaining visualized cervical soft tissues unremarkable. Disc levels:  No additional abnormalities Upper chest: Emphysematous changes at lung apices. 4  mm RIGHT upper lobe nodule image 80. Minimal scattered interseptal thickening. Other: Atherosclerotic calcifications of the carotid bifurcations, proximal great vessels and aortic arch. IMPRESSION: Atrophy with mild small vessel chronic ischemic changes of deep cerebral white matter. No acute intracranial abnormalities. Degenerative disc and facet disease changes of the cervical spine. No acute  cervical spine abnormalities. 4 mm RIGHT upper lobe pulmonary nodule, recommendation below. No follow-up needed if patient is low-risk. Non-contrast chest CT can be considered in 12 months if patient is high-risk. This recommendation follows the consensus statement: Guidelines for Management of Incidental Pulmonary Nodules Detected on CT Images: From the Fleischner Society 2017; Radiology 2017; 284:228-243. Electronically Signed   By: Lavonia Dana M.D.   On: 05/26/2017 20:22   Dg Hip Port Unilat With Pelvis 1v Right  Result Date: 05/26/2017 CLINICAL DATA:  Fall, right hip pain EXAM: DG HIP (WITH OR WITHOUT PELVIS) 1V PORT RIGHT COMPARISON:  None. FINDINGS: No pelvic diastasis. Right hip hemiarthroplasty. Acute oblique periprosthetic fracture in the proximal right femur with 1 shaft's with medial displacement of the distal fracture fragment and with overriding of the fracture fragments. No evidence of dislocation at the hip joints on these frontal views. No suspicious focal osseous lesions. Degenerative changes in the visualized lower lumbar spine. IMPRESSION: Right hip hemiarthroplasty. Acute periprosthetic fracture in the proximal right femur with displacement and overriding as detailed. No evidence of hip dislocation on these frontal views. Electronically Signed   By: Ilona Sorrel M.D.   On: 05/26/2017 20:03     EKG:  Not done in ED, will get one.   Assessment/Plan Principal Problem:   Closed right hip fracture (HCC) Active Problems:   Depression   HLD (hyperlipidemia)   Essential hypertension   Fall   Normocytic anemia   Abdominal distention   CAD (coronary artery disease)   Closed right hip fracture ([place]):  As evidenced by x-ray. Patient has moderate pain now. No neurovascular compromise. Orthopedic surgeon was consulted. Dr. Ninfa Linden will see pt in AM.   - will admit to Med-surg bed - Pain control: morphine prn and percocet - When necessary Zofran for nausea - Robaxin for muscle  spasm - type and cross - INR/PTT - PT/OT when able to (not ordered now)  Leukocytosis: Likely due to stress-induced demargination. Patient does not have signs of infection. Pt is on Azithromycin for left eyelid infection -Follow-up CBC - check UA  Fall: seems to have had mechanical fall. CT head and neck is negative for acute intracranial or C spin abnormalities. -- PT/OT when able to (not ordered now) -wound care consult to scalp laceration  Abdominal distention: no nausea, vomiting, diarrhea or abdominal pain, but has mild abdominal tenderness on physical examination. -CT abdomen/pelvis to rule out obstruction  Addendum: CT scan showed no obstruction, but showed stable asymmetric wall thickening of the left rectum suspicious for mass lesion. -will get CEA and AFP markers now -need to give referral to oncology.  HLD: -Lipitor and fenofibrate  HTN:  -Continue home medications: Losartan, metoprolol -IV hydralazine prn  CAD: had heart attack 02/11/17 per her daughter. Patient did not have stent placement. Patient was on Plavix, which has been on hold since right hip replacement surgery 2 months ago -continue fenofibrate, Lipitor, metoprolol -will get EKG -will get 2d echo for presurgical evaluation (no 2-D echo on record)  -check BNP  Depression: Stable, no suicidal or homicidal ideations. -Continue home medications: Zoloft  Normocytic anemia: Hgb 9.6. No baseline hemoglobin on record -get  anemia panel  Right eyelid infection: -let pt to completed 5-day course of azithromycin (3 more doses left)  Lung nodule: CT scan incidentally showed 4 mm lung nodule -Follow up with PCP   DVT ppx: SCD Code Status: Full code Family Communication:  Yes, patient's  Daughter and family  at bed side Disposition Plan:  Anticipate discharge back to previous home environment Consults called:  [facility], Dr. Ninfa Linden Admission status:  medical floor/obs    Date of Service 05/26/2017     Ivor Costa [facility] Pager [phone]  If 7PM-7AM, please contact night-coverage [email or web address] Password [facility] 05/26/2017, 10:42 PM

## 2017-05-26 NOTE — Consult Note (Signed)
Reason for Consult: Right periprosthetic proximal femur fracture Referring Physician: Virgel Manifold, MD (EDP)  Maureen Morales is an 84 y.o. female.  HPI:   The patient is a 84 year old female with a history of a right hip hemiarthroplasty performed in [place] in October of this past year.  She lives at home with her elderly husband.  She sustained a mechanical fall today and with severe right hip and thigh pain was transported to the [facility] emergency room.  X-rays here showed a right periprosthetic femur fracture around her previous right hip hemiarthroplasty.  She is being admitted to the medicine service for medical management and potential surgical clearance for a likely complex operation that will be needed in the near future.  Family is at the bedside.  The patient is in obvious discomfort from her right hip/femur injury.  Past Medical History:  Diagnosis Date  . CAD (coronary artery disease)   . Depression   . Essential hypertension   . HLD (hyperlipidemia)     Past Surgical History:  Procedure Laterality Date  . TOTAL HIP ARTHROPLASTY Right   . uterian prolapse      Family History  Problem Relation Age of Onset  . Breast cancer Mother   . Colon cancer Father   . Stroke Sister     Social History:  reports that  has never smoked. She does not have any smokeless tobacco history on file. She reports that she does not drink alcohol or use drugs.  Allergies:  Allergies  Allergen Reactions  . Asa [Aspirin] Anaphylaxis  . Darvon [Propoxyphene] Nausea And Vomiting  . Ferrous Sulfate Diarrhea  . Meperidine And Related   . Penicillins Other (See Comments)    Pt and family unsure of details    Medications: I have reviewed the patient's current medications.  Results for orders placed or performed during the hospital encounter of 05/26/17 (from the past 48 hour(s))  CBC with Differential     Status: Abnormal   Collection Time: 05/26/17  7:15 PM  Result Value Ref Range    WBC 11.4 (H) 4.0 - 10.5 K/uL   RBC 3.47 (L) 3.87 - 5.11 MIL/uL   Hemoglobin 9.6 (L) 12.0 - 15.0 g/dL   HCT 29.4 (L) 36.0 - 46.0 %   MCV 84.7 78.0 - 100.0 fL   MCH 27.7 26.0 - 34.0 pg   MCHC 32.7 30.0 - 36.0 g/dL   RDW 14.9 11.5 - 15.5 %   Platelets 320 150 - 400 K/uL   Neutrophils Relative % 60 %   Neutro Abs 6.8 1.7 - 7.7 K/uL   Lymphocytes Relative 30 %   Lymphs Abs 3.4 0.7 - 4.0 K/uL   Monocytes Relative 6 %   Monocytes Absolute 0.7 0.1 - 1.0 K/uL   Eosinophils Relative 4 %   Eosinophils Absolute 0.5 0.0 - 0.7 K/uL   Basophils Relative 0 %   Basophils Absolute 0.0 0.0 - 0.1 K/uL  Basic metabolic panel     Status: Abnormal   Collection Time: 05/26/17  7:15 PM  Result Value Ref Range   Sodium 139 135 - 145 mmol/L   Potassium 3.6 3.5 - 5.1 mmol/L   Chloride 107 101 - 111 mmol/L   CO2 22 22 - 32 mmol/L   Glucose, Bld 319 (H) 65 - 99 mg/dL   BUN 37 (H) 6 - 20 mg/dL   Creatinine, Ser 1.00 0.44 - 1.00 mg/dL   Calcium 9.1 8.9 - 10.3 mg/dL   GFR  calc non Af Amer 47 (L) >60 mL/min   GFR calc Af Amer 55 (L) >60 mL/min    Comment: (NOTE) The eGFR has been calculated using the CKD EPI equation. This calculation has not been validated in all clinical situations. eGFR's persistently <60 mL/min signify possible Chronic Kidney Disease.    Anion gap 10 5 - 15  Type and screen [place]     Status: None   Collection Time: 05/26/17  7:15 PM  Result Value Ref Range   ABO/RH(D) O NEG    Antibody Screen NEG    Sample Expiration 05/29/2017   ABO/Rh     Status: None   Collection Time: 05/26/17  7:15 PM  Result Value Ref Range   ABO/RH(D) O NEG   Protime-INR     Status: Abnormal   Collection Time: 05/26/17  7:15 PM  Result Value Ref Range   Prothrombin Time 15.7 (H) 11.4 - 15.2 seconds   INR 1.26   APTT     Status: None   Collection Time: 05/26/17  7:15 PM  Result Value Ref Range   aPTT 30 24 - 36 seconds  I-Stat CG4 Lactic Acid, ED     Status: None   Collection  Time: 05/26/17  7:48 PM  Result Value Ref Range   Lactic Acid, Venous 1.45 0.5 - 1.9 mmol/L    Dg Shoulder Right  Result Date: 05/26/2017 CLINICAL DATA:  Fall.  Right shoulder pain.  Initial encounter. EXAM: RIGHT SHOULDER - 2+ VIEW COMPARISON:  None. FINDINGS: There is no evidence of acute fracture or dislocation. Generalized osteopenia noted. Soft tissues are unremarkable. IMPRESSION: No acute findings. Electronically Signed   By: Earle Gell M.D.   On: 05/26/2017 23:10   Ct Head Wo Contrast  Result Date: 05/26/2017 CLINICAL DATA:  Sneezed then fell to the ground, no loss of consciousness, possible small skull deformity of back of head, RIGHT hip deformity, initial encounter EXAM: CT HEAD WITHOUT CONTRAST CT CERVICAL SPINE WITHOUT CONTRAST TECHNIQUE: Multidetector CT imaging of the head and cervical spine was performed following the standard protocol without intravenous contrast. Multiplanar CT image reconstructions of the cervical spine were also generated. COMPARISON:  None FINDINGS: CT HEAD FINDINGS Brain: Mild generalized atrophy. Normal ventricular morphology. No midline shift or mass effect. Mild small vessel chronic ischemic changes of periventricular white matter in the frontal lobes. No intracranial hemorrhage, mass lesion, or evidence of acute infarction. No extra-axial fluid collections. Vascular: Extensive atherosclerotic calcification of internal carotid arteries at skull base Skull: Calvaria intact.  RIGHT parietooccipital scalp hematoma. Sinuses/Orbits: Visualized paranasal sinuses and mastoid air cells clear Other: N/A CT CERVICAL SPINE FINDINGS Alignment: Normal Skull base and vertebrae: Osseous demineralization. Visualized skull base intact. Vertebral body heights maintained without fracture or bone destruction. Multilevel disc space narrowing and endplate spur formation. Multilevel facet degenerative changes. Encroachment upon RIGHT cervical neural foramina at C4-C5 by facet  hypertrophy. Soft tissues and spinal canal: Prevertebral soft tissues normal thickness. Remaining visualized cervical soft tissues unremarkable. Disc levels:  No additional abnormalities Upper chest: Emphysematous changes at lung apices. 4 mm RIGHT upper lobe nodule image 80. Minimal scattered interseptal thickening. Other: Atherosclerotic calcifications of the carotid bifurcations, proximal great vessels and aortic arch. IMPRESSION: Atrophy with mild small vessel chronic ischemic changes of deep cerebral white matter. No acute intracranial abnormalities. Degenerative disc and facet disease changes of the cervical spine. No acute cervical spine abnormalities. 4 mm RIGHT upper lobe pulmonary nodule, recommendation below. No follow-up  needed if patient is low-risk. Non-contrast chest CT can be considered in 12 months if patient is high-risk. This recommendation follows the consensus statement: Guidelines for Management of Incidental Pulmonary Nodules Detected on CT Images: From the Fleischner Society 2017; Radiology 2017; 284:228-243. Electronically Signed   By: Lavonia Dana M.D.   On: 05/26/2017 20:22   Ct Cervical Spine Wo Contrast  Result Date: 05/26/2017 CLINICAL DATA:  Sneezed then fell to the ground, no loss of consciousness, possible small skull deformity of back of head, RIGHT hip deformity, initial encounter EXAM: CT HEAD WITHOUT CONTRAST CT CERVICAL SPINE WITHOUT CONTRAST TECHNIQUE: Multidetector CT imaging of the head and cervical spine was performed following the standard protocol without intravenous contrast. Multiplanar CT image reconstructions of the cervical spine were also generated. COMPARISON:  None FINDINGS: CT HEAD FINDINGS Brain: Mild generalized atrophy. Normal ventricular morphology. No midline shift or mass effect. Mild small vessel chronic ischemic changes of periventricular white matter in the frontal lobes. No intracranial hemorrhage, mass lesion, or evidence of acute infarction. No  extra-axial fluid collections. Vascular: Extensive atherosclerotic calcification of internal carotid arteries at skull base Skull: Calvaria intact.  RIGHT parietooccipital scalp hematoma. Sinuses/Orbits: Visualized paranasal sinuses and mastoid air cells clear Other: N/A CT CERVICAL SPINE FINDINGS Alignment: Normal Skull base and vertebrae: Osseous demineralization. Visualized skull base intact. Vertebral body heights maintained without fracture or bone destruction. Multilevel disc space narrowing and endplate spur formation. Multilevel facet degenerative changes. Encroachment upon RIGHT cervical neural foramina at C4-C5 by facet hypertrophy. Soft tissues and spinal canal: Prevertebral soft tissues normal thickness. Remaining visualized cervical soft tissues unremarkable. Disc levels:  No additional abnormalities Upper chest: Emphysematous changes at lung apices. 4 mm RIGHT upper lobe nodule image 80. Minimal scattered interseptal thickening. Other: Atherosclerotic calcifications of the carotid bifurcations, proximal great vessels and aortic arch. IMPRESSION: Atrophy with mild small vessel chronic ischemic changes of deep cerebral white matter. No acute intracranial abnormalities. Degenerative disc and facet disease changes of the cervical spine. No acute cervical spine abnormalities. 4 mm RIGHT upper lobe pulmonary nodule, recommendation below. No follow-up needed if patient is low-risk. Non-contrast chest CT can be considered in 12 months if patient is high-risk. This recommendation follows the consensus statement: Guidelines for Management of Incidental Pulmonary Nodules Detected on CT Images: From the Fleischner Society 2017; Radiology 2017; 284:228-243. Electronically Signed   By: Lavonia Dana M.D.   On: 05/26/2017 20:22   Dg Hip Port Unilat With Pelvis 1v Right  Result Date: 05/26/2017 CLINICAL DATA:  Fall, right hip pain EXAM: DG HIP (WITH OR WITHOUT PELVIS) 1V PORT RIGHT COMPARISON:  None. FINDINGS: No  pelvic diastasis. Right hip hemiarthroplasty. Acute oblique periprosthetic fracture in the proximal right femur with 1 shaft's with medial displacement of the distal fracture fragment and with overriding of the fracture fragments. No evidence of dislocation at the hip joints on these frontal views. No suspicious focal osseous lesions. Degenerative changes in the visualized lower lumbar spine. IMPRESSION: Right hip hemiarthroplasty. Acute periprosthetic fracture in the proximal right femur with displacement and overriding as detailed. No evidence of hip dislocation on these frontal views. Electronically Signed   By: Ilona Sorrel M.D.   On: 05/26/2017 20:03   Independent review of the x-ray shows a right periprosthetic femur fracture with significant displacement around her right hip hemiarthroplasty that is been cemented proximally.   ROS Blood pressure (!) 129/51, pulse 69, resp. rate 19, SpO2 100 %. Physical Exam  Constitutional: She appears well-developed and  well-nourished.  Neck: Normal range of motion.  Cardiovascular: Normal rate.  Respiratory: Effort normal.  GI: Soft.  Musculoskeletal:       Right upper leg: She exhibits tenderness, bony tenderness, swelling and deformity.  Neurological: She is alert.  Skin: Skin is warm and dry.    Assessment/Plan: Right periprosthetic proximal femur fracture around a previous hip hemiarthroplasty  I have spoken with the family about the difficult nature of this injury.  She will most likely need open reduction-internal fixation with a long cable plate and screws to restore the alignment of her femur.  Due to her osteopenic bone and the nature of fracture such as these will not allow her to put weight on this leg for several months.  She will likely need further convalescence at a skilled nursing facility post her hospital stay.  The family understands that this is certainly a significant injury to have an fixation can be difficult as well.  Obviously  medical clearance is necessary for surgery such as this and having the right instrumentation available.  There is a chance we will put her on schedule for Sunday but may need to delay it a day or 2 depending on the OR schedule and availability of the instrumentation as well as other factors including her medical status.  For now we will have her placed in 10 pounds of [place] traction on the right lower extremity when she is placed on a regular hospital bed this evening.  Mcarthur Rossetti 05/26/2017, 11:36 PM

## 2017-05-26 NOTE — ED Provider Notes (Signed)
..  Laceration Repair Date/Time: 05/26/2017 11:30 PM Performed by: Abigail Butts, PA-C Authorized by: Abigail Butts, PA-C   Consent:    Consent obtained:  Verbal   Consent given by:  Patient   Risks discussed:  Pain, poor wound healing and infection Anesthesia (see MAR for exact dosages):    Anesthesia method:  None Laceration details:    Location:  Scalp   Scalp location:  R parietal   Length (cm):  3.5 Repair type:    Repair type:  Simple Pre-procedure details:    Preparation:  Patient was prepped and draped in usual sterile fashion Exploration:    Hemostasis achieved with:  Direct pressure   Wound exploration: entire depth of wound probed and visualized   Treatment:    Area cleansed with:  Soap and water and Shur-Clens   Amount of cleaning:  Extensive   Irrigation solution:  Sterile water   Irrigation volume:  242ml   Irrigation method:  Syringe Skin repair:    Repair method:  Staples   Number of staples:  3 Approximation:    Approximation:  Close   Vermilion border: well-aligned   Post-procedure details:    Dressing:  Open (no dressing)   Patient tolerance of procedure:  Tolerated well, no immediate complications     Maureen Morales 05/26/17 [id number]    Maureen Manifold, MD 05/27/17 1900

## 2017-05-26 NOTE — ED Notes (Signed)
RN and NT began to clean blood from pt's head, Dr. Blaine Hamper arrived.  RN will continue when Provider is complete w/ his assessment.

## 2017-05-26 NOTE — ED Triage Notes (Signed)
According to EMS, pt sneezed and fell to the ground, no LOC.  Pt had right hip deformities and a small skull deformity on the back of her head.  She is not on blood thinners.

## 2017-05-26 NOTE — Progress Notes (Signed)
Orthopedic Tech Progress Note Patient Details:  Maureen Morales 05/01/1873 [id number] Level 2 trauma ortho visit. Patient ID: Maureen Morales, female   DOB: 05/01/1873, 84 y.o.   MRN: [id number]   Braulio Bosch 05/26/2017, 7:27 PM

## 2017-05-26 NOTE — ED Provider Notes (Signed)
[facility] Provider Note   CSN: [id number] Arrival date & time: 05/26/17  1911     History   Chief Complaint Chief Complaint  Patient presents with  . Fall  . Hip Pain    HPI Maureen Morales is a 84 y.o. female.  HPI   84 year old female presenting as a level 2 trauma after a fall.  She fell from standing.  Apparently she sneezed forcefully and lost her balance.  She fell backwards and struck her head against the ground.  No loss of consciousness.  Large scalp hematoma and EMS is questioning possible skull fracture.  Deformity to her right hip/proximal femur.  No blood thinners.   There are no active problems to display for this patient.   History reviewed. No pertinent surgical history.  OB History    No data available       Home Medications    Prior to Admission medications   Medication Sig Start Date End Date Taking? Authorizing Provider  atorvastatin (LIPITOR) 40 MG tablet Take 40 mg by mouth daily.   Yes [provider]  fenofibrate 160 MG tablet Take 160 mg by mouth daily.   Yes [provider]  loratadine (CLARITIN) 10 MG tablet Take 10 mg by mouth daily.   Yes [provider]  losartan (COZAAR) 100 MG tablet Take 100 mg by mouth daily.   Yes [provider]  metoprolol succinate (TOPROL-XL) 50 MG 24 hr tablet Take 50 mg by mouth daily. Take with or immediately following a meal.   Yes [provider]  sertraline (ZOLOFT) 50 MG tablet Take 50 mg by mouth daily.   Yes [provider]  vitamin C (ASCORBIC ACID) 500 MG tablet Take 500 mg by mouth 2 (two) times daily.   Yes [provider]    Family History No family history on file.  Social History Social History   Tobacco Use  . Smoking status: Not on file  Substance Use Topics  . Alcohol use: Not on file  . Drug use: Not on file     Allergies   Asa [aspirin]; Darvon [propoxyphene]; Ferrous sulfate;  Meperidine and related; and Penicillins   Review of Systems Review of Systems  All systems reviewed and negative, other than as noted in HPI.  Physical Exam Updated Vital Signs BP (!) 115/55   Pulse (!) 58   Resp 15   SpO2 98%   Physical Exam  Constitutional: She appears well-developed and well-nourished. No distress.  HENT:  Head: Normocephalic.  3 cm posterior scalp laceration filled with clot.  No palpable deformity.  Eyes: Conjunctivae are normal. Right eye exhibits no discharge. Left eye exhibits no discharge.  Neck: Neck supple.  Cardiovascular: Normal rate, regular rhythm and normal heart sounds. Exam reveals no gallop and no friction rub.  No murmur heard. Pulmonary/Chest: Effort normal and breath sounds normal. No respiratory distress.  Abdominal: Soft. She exhibits no distension. There is no tenderness.  Musculoskeletal: She exhibits tenderness and deformity. She exhibits no edema.  Right hip/proximal femur deformity.  Closed injury.  Neurovascular intact distally.  No midline spinal tenderness  Neurological: She is alert. No cranial nerve deficit. She exhibits normal muscle tone.  Skin: Skin is warm and dry.  Psychiatric: She has a normal mood and affect. Her behavior is normal. Thought content normal.  Nursing note and vitals reviewed.    ED Treatments / Results  Labs (all labs ordered are listed, but only  abnormal results are displayed) Labs Reviewed  CBC WITH DIFFERENTIAL/PLATELET - Abnormal; Notable for the following components:      Result Value   WBC 11.4 (*)    RBC 3.47 (*)    Hemoglobin 9.6 (*)    HCT 29.4 (*)    All other components within normal limits  BASIC METABOLIC PANEL - Abnormal; Notable for the following components:   Glucose, Bld 319 (*)    BUN 37 (*)    GFR calc non Af Amer 47 (*)    GFR calc Af Amer 55 (*)    All other components within normal limits  I-STAT CG4 LACTIC ACID, ED  TYPE AND SCREEN  ABO/RH    EKG  EKG  Interpretation None       Radiology Ct Head Wo Contrast  Result Date: 05/26/2017 CLINICAL DATA:  Sneezed then fell to the ground, no loss of consciousness, possible small skull deformity of back of head, RIGHT hip deformity, initial encounter EXAM: CT HEAD WITHOUT CONTRAST CT CERVICAL SPINE WITHOUT CONTRAST TECHNIQUE: Multidetector CT imaging of the head and cervical spine was performed following the standard protocol without intravenous contrast. Multiplanar CT image reconstructions of the cervical spine were also generated. COMPARISON:  None FINDINGS: CT HEAD FINDINGS Brain: Mild generalized atrophy. Normal ventricular morphology. No midline shift or mass effect. Mild small vessel chronic ischemic changes of periventricular white matter in the frontal lobes. No intracranial hemorrhage, mass lesion, or evidence of acute infarction. No extra-axial fluid collections. Vascular: Extensive atherosclerotic calcification of internal carotid arteries at skull base Skull: Calvaria intact.  RIGHT parietooccipital scalp hematoma. Sinuses/Orbits: Visualized paranasal sinuses and mastoid air cells clear Other: N/A CT CERVICAL SPINE FINDINGS Alignment: Normal Skull base and vertebrae: Osseous demineralization. Visualized skull base intact. Vertebral body heights maintained without fracture or bone destruction. Multilevel disc space narrowing and endplate spur formation. Multilevel facet degenerative changes. Encroachment upon RIGHT cervical neural foramina at C4-C5 by facet hypertrophy. Soft tissues and spinal canal: Prevertebral soft tissues normal thickness. Remaining visualized cervical soft tissues unremarkable. Disc levels:  No additional abnormalities Upper chest: Emphysematous changes at lung apices. 4 mm RIGHT upper lobe nodule image 80. Minimal scattered interseptal thickening. Other: Atherosclerotic calcifications of the carotid bifurcations, proximal great vessels and aortic arch. IMPRESSION: Atrophy with  mild small vessel chronic ischemic changes of deep cerebral white matter. No acute intracranial abnormalities. Degenerative disc and facet disease changes of the cervical spine. No acute cervical spine abnormalities. 4 mm RIGHT upper lobe pulmonary nodule, recommendation below. No follow-up needed if patient is low-risk. Non-contrast chest CT can be considered in 12 months if patient is high-risk. This recommendation follows the consensus statement: Guidelines for Management of Incidental Pulmonary Nodules Detected on CT Images: From the Fleischner Society 2017; Radiology 2017; 284:228-243. Electronically Signed   By: Lavonia Dana M.D.   On: 05/26/2017 20:22   Ct Cervical Spine Wo Contrast  Result Date: 05/26/2017 CLINICAL DATA:  Sneezed then fell to the ground, no loss of consciousness, possible small skull deformity of back of head, RIGHT hip deformity, initial encounter EXAM: CT HEAD WITHOUT CONTRAST CT CERVICAL SPINE WITHOUT CONTRAST TECHNIQUE: Multidetector CT imaging of the head and cervical spine was performed following the standard protocol without intravenous contrast. Multiplanar CT image reconstructions of the cervical spine were also generated. COMPARISON:  None FINDINGS: CT HEAD FINDINGS Brain: Mild generalized atrophy. Normal ventricular morphology. No midline shift or mass effect. Mild small vessel chronic ischemic changes of periventricular white matter in the  frontal lobes. No intracranial hemorrhage, mass lesion, or evidence of acute infarction. No extra-axial fluid collections. Vascular: Extensive atherosclerotic calcification of internal carotid arteries at skull base Skull: Calvaria intact.  RIGHT parietooccipital scalp hematoma. Sinuses/Orbits: Visualized paranasal sinuses and mastoid air cells clear Other: N/A CT CERVICAL SPINE FINDINGS Alignment: Normal Skull base and vertebrae: Osseous demineralization. Visualized skull base intact. Vertebral body heights maintained without fracture or  bone destruction. Multilevel disc space narrowing and endplate spur formation. Multilevel facet degenerative changes. Encroachment upon RIGHT cervical neural foramina at C4-C5 by facet hypertrophy. Soft tissues and spinal canal: Prevertebral soft tissues normal thickness. Remaining visualized cervical soft tissues unremarkable. Disc levels:  No additional abnormalities Upper chest: Emphysematous changes at lung apices. 4 mm RIGHT upper lobe nodule image 80. Minimal scattered interseptal thickening. Other: Atherosclerotic calcifications of the carotid bifurcations, proximal great vessels and aortic arch. IMPRESSION: Atrophy with mild small vessel chronic ischemic changes of deep cerebral white matter. No acute intracranial abnormalities. Degenerative disc and facet disease changes of the cervical spine. No acute cervical spine abnormalities. 4 mm RIGHT upper lobe pulmonary nodule, recommendation below. No follow-up needed if patient is low-risk. Non-contrast chest CT can be considered in 12 months if patient is high-risk. This recommendation follows the consensus statement: Guidelines for Management of Incidental Pulmonary Nodules Detected on CT Images: From the Fleischner Society 2017; Radiology 2017; 284:228-243. Electronically Signed   By: Lavonia Dana M.D.   On: 05/26/2017 20:22   Dg Hip Port Unilat With Pelvis 1v Right  Result Date: 05/26/2017 CLINICAL DATA:  Fall, right hip pain EXAM: DG HIP (WITH OR WITHOUT PELVIS) 1V PORT RIGHT COMPARISON:  None. FINDINGS: No pelvic diastasis. Right hip hemiarthroplasty. Acute oblique periprosthetic fracture in the proximal right femur with 1 shaft's with medial displacement of the distal fracture fragment and with overriding of the fracture fragments. No evidence of dislocation at the hip joints on these frontal views. No suspicious focal osseous lesions. Degenerative changes in the visualized lower lumbar spine. IMPRESSION: Right hip hemiarthroplasty. Acute  periprosthetic fracture in the proximal right femur with displacement and overriding as detailed. No evidence of hip dislocation on these frontal views. Electronically Signed   By: Ilona Sorrel M.D.   On: 05/26/2017 20:03    Procedures Procedures (including critical care time)  Medications Ordered in ED Medications  0.9 %  sodium chloride infusion ( Intravenous New Bag/Given 05/26/17 1936)  fentaNYL (SUBLIMAZE) 100 MCG/2ML injection (not administered)  fentaNYL (SUBLIMAZE) injection 50 mcg (50 mcg Intravenous Given 05/26/17 2048)     Initial Impression / Assessment and Plan / ED Course  I have reviewed the triage vital signs and the nursing notes.  Pertinent labs & imaging results that were available during my care of the patient were reviewed by me and considered in my medical decision making (see chart for details).     84 year old female status post mechanical fall.  Right hip/proximal femur fracture.  Closed injury.  Discussed with orthopedics.  Bucks traction.  Will evaluate her in the morning.  Posterior scalp laceration was closed.  Please see in his note for further details.  CT imaging negative for acute abnormality.  Final Clinical Impressions(s) / ED Diagnoses   Final diagnoses:  Fall  Hyperlipidemia, unspecified hyperlipidemia type  Essential hypertension  Surgery, elective  Closed right hip fracture ([place])  Femur fracture [facility])    ED Discharge Orders    None       Virgel Manifold, MD 05/27/17 2008

## 2017-05-27 ENCOUNTER — Inpatient Hospital Stay (HOSPITAL_COMMUNITY): Payer: Medicare Other | Admitting: Anesthesiology

## 2017-05-27 ENCOUNTER — Inpatient Hospital Stay (HOSPITAL_COMMUNITY): Payer: Medicare Other

## 2017-05-27 ENCOUNTER — Encounter: Payer: Self-pay | Admitting: Internal Medicine

## 2017-05-27 ENCOUNTER — Other Ambulatory Visit (HOSPITAL_COMMUNITY): Payer: Medicare Other

## 2017-05-27 ENCOUNTER — Encounter (HOSPITAL_COMMUNITY)
Admission: EM | Disposition: A | Discharge status: Rehabilitation Facility | Payer: Self-pay | Source: Home / Self Care | Attending: Internal Medicine

## 2017-05-27 HISTORY — PX: ORIF PERIPROSTHETIC FRACTURE: SHX5034

## 2017-05-27 LAB — URINALYSIS, ROUTINE W REFLEX MICROSCOPIC
Bacteria, UA: NONE SEEN
Bilirubin Urine: NEGATIVE
HGB URINE DIPSTICK: NEGATIVE
Ketones, ur: NEGATIVE mg/dL
Leukocytes, UA: NEGATIVE
Nitrite: NEGATIVE
Protein, ur: 30 mg/dL — AB
SPECIFIC GRAVITY, URINE: 1.023 (ref 1.005–1.030)
pH: 5 (ref 5.0–8.0)

## 2017-05-27 LAB — GLUCOSE, CAPILLARY
GLUCOSE-CAPILLARY: 248 mg/dL — AB (ref 65–99)
GLUCOSE-CAPILLARY: 185 mg/dL — AB (ref 65–99)
GLUCOSE-CAPILLARY: 238 mg/dL — AB (ref 65–99)
GLUCOSE-CAPILLARY: 222 mg/dL — AB (ref 65–99)
Glucose-Capillary: 255 mg/dL — ABNORMAL HIGH (ref 65–99)
Glucose-Capillary: 175 mg/dL — ABNORMAL HIGH (ref 65–99)

## 2017-05-27 LAB — IRON AND TIBC
IRON: 28 ug/dL (ref 28–170)
Saturation Ratios: 8 % — ABNORMAL LOW (ref 10.4–31.8)
TIBC: 350 ug/dL (ref 250–450)
UIBC: 322 ug/dL

## 2017-05-27 LAB — CBC
HCT: 26.6 % — ABNORMAL LOW (ref 36.0–46.0)
Hemoglobin: 8.7 g/dL — ABNORMAL LOW (ref 12.0–15.0)
MCH: 27.6 pg (ref 26.0–34.0)
MCHC: 32.7 g/dL (ref 30.0–36.0)
MCV: 84.4 fL (ref 78.0–100.0)
PLATELETS: 264 10*3/uL (ref 150–400)
RBC: 3.15 MIL/uL — ABNORMAL LOW (ref 3.87–5.11)
RDW: 14.6 % (ref 11.5–15.5)
WBC: 18.7 10*3/uL — AB (ref 4.0–10.5)

## 2017-05-27 LAB — PREPARE RBC (CROSSMATCH)

## 2017-05-27 LAB — FOLATE: Folate: 12.1 ng/mL (ref >5.9)

## 2017-05-27 LAB — RETICULOCYTES
RBC.: 3.07 MIL/uL — ABNORMAL LOW (ref 3.87–5.11)
RETIC COUNT ABSOLUTE: 36.8 10*3/uL (ref 19.0–186.0)
RETIC CT PCT: 1.2 % (ref 0.4–3.1)

## 2017-05-27 LAB — BRAIN NATRIURETIC PEPTIDE: B NATRIURETIC PEPTIDE 5: 221.6 pg/mL — AB (ref 0.0–100.0)

## 2017-05-27 LAB — FERRITIN: FERRITIN: 192 ng/mL (ref 11–307)

## 2017-05-27 LAB — VITAMIN B12: VITAMIN B 12: 678 pg/mL (ref 180–914)

## 2017-05-27 SURGERY — OPEN REDUCTION INTERNAL FIXATION (ORIF) PERIPROSTHETIC FRACTURE
Anesthesia: General | Site: Leg Upper | Laterality: Right

## 2017-05-27 MED ORDER — FENTANYL CITRATE (PF) 100 MCG/2ML IJ SOLN
INTRAMUSCULAR | Status: AC
  Administered 2017-05-27: 25 ug via INTRAVENOUS
  Filled 2017-05-27: qty 2

## 2017-05-27 MED ORDER — ONDANSETRON HCL 4 MG PO TABS: 4.0000 mg | ORAL_TABLET | Freq: Four times a day (QID) | ORAL | Status: DC | PRN

## 2017-05-27 MED ORDER — CLINDAMYCIN PHOSPHATE 900 MG/50ML IV SOLN
900.0000 mg | Freq: Once | INTRAVENOUS | Status: AC
  Administered 2017-05-27: 900 mg via INTRAVENOUS
  Filled 2017-05-27: qty 50

## 2017-05-27 MED ORDER — FENTANYL CITRATE (PF) 100 MCG/2ML IJ SOLN
INTRAMUSCULAR | Status: DC | PRN
  Administered 2017-05-27: 50 ug via INTRAVENOUS
  Administered 2017-05-27 (×2): 25 ug via INTRAVENOUS

## 2017-05-27 MED ORDER — ROCURONIUM BROMIDE 100 MG/10ML IV SOLN
INTRAVENOUS | Status: DC | PRN
  Administered 2017-05-27: 40 mg via INTRAVENOUS

## 2017-05-27 MED ORDER — ONDANSETRON HCL 4 MG/2ML IJ SOLN
INTRAMUSCULAR | Status: DC | PRN
  Administered 2017-05-27: 4 mg via INTRAVENOUS

## 2017-05-27 MED ORDER — PHENYLEPHRINE HCL 10 MG/ML IJ SOLN
INTRAVENOUS | Status: DC | PRN
  Administered 2017-05-27: 25 ug/min via INTRAVENOUS

## 2017-05-27 MED ORDER — PROPOFOL 500 MG/50ML IV EMUL
INTRAVENOUS | Status: DC | PRN
  Administered 2017-05-27: 25 ug/kg/min via INTRAVENOUS

## 2017-05-27 MED ORDER — METHOCARBAMOL 1000 MG/10ML IJ SOLN
500.0000 mg | Freq: Four times a day (QID) | INTRAMUSCULAR | Status: DC | PRN
  Administered 2017-05-27: 500 mg via INTRAVENOUS
  Filled 2017-05-27: qty 5

## 2017-05-27 MED ORDER — OXYCODONE HCL 5 MG PO TABS: 5.0000 mg | ORAL_TABLET | Freq: Once | ORAL | Status: DC | PRN

## 2017-05-27 MED ORDER — PHENYLEPHRINE 40 MCG/ML (10ML) SYRINGE FOR IV PUSH (FOR BLOOD PRESSURE SUPPORT)
PREFILLED_SYRINGE | INTRAVENOUS | Status: AC
  Filled 2017-05-27: qty 10

## 2017-05-27 MED ORDER — BACITRACIN ZINC 500 UNIT/GM EX OINT
TOPICAL_OINTMENT | Freq: Two times a day (BID) | CUTANEOUS | Status: DC
  Administered 2017-05-28 – 2017-05-30 (×5): via TOPICAL
  Filled 2017-05-27: qty 28.35

## 2017-05-27 MED ORDER — CLINDAMYCIN PHOSPHATE 900 MG/50ML IV SOLN
INTRAVENOUS | Status: AC
  Filled 2017-05-27: qty 50

## 2017-05-27 MED ORDER — ONDANSETRON HCL 4 MG/2ML IJ SOLN: 4.0000 mg | Freq: Once | INTRAMUSCULAR | Status: DC | PRN

## 2017-05-27 MED ORDER — MENTHOL 3 MG MT LOZG: 1.0000 | LOZENGE | OROMUCOSAL | Status: DC | PRN

## 2017-05-27 MED ORDER — ROCURONIUM BROMIDE 10 MG/ML (PF) SYRINGE
PREFILLED_SYRINGE | INTRAVENOUS | Status: AC
  Filled 2017-05-27: qty 5

## 2017-05-27 MED ORDER — DEXAMETHASONE SODIUM PHOSPHATE 10 MG/ML IJ SOLN
INTRAMUSCULAR | Status: AC
  Filled 2017-05-27: qty 1

## 2017-05-27 MED ORDER — OXYCODONE HCL 5 MG/5ML PO SOLN: 5.0000 mg | Freq: Once | ORAL | Status: DC | PRN

## 2017-05-27 MED ORDER — CLINDAMYCIN PHOSPHATE 600 MG/50ML IV SOLN
600.0000 mg | Freq: Four times a day (QID) | INTRAVENOUS | Status: AC
  Administered 2017-05-27 (×2): 600 mg via INTRAVENOUS
  Filled 2017-05-27 (×2): qty 50

## 2017-05-27 MED ORDER — INSULIN REGULAR HUMAN 100 UNIT/ML IJ SOLN
INTRAMUSCULAR | Status: DC
  Filled 2017-05-27: qty 1

## 2017-05-27 MED ORDER — 0.9 % SODIUM CHLORIDE (POUR BTL) OPTIME
TOPICAL | Status: DC | PRN
  Administered 2017-05-27: 1000 mL

## 2017-05-27 MED ORDER — ONDANSETRON HCL 4 MG/2ML IJ SOLN
INTRAMUSCULAR | Status: AC
  Filled 2017-05-27: qty 2

## 2017-05-27 MED ORDER — ACETAMINOPHEN 650 MG RE SUPP: 650.0000 mg | Freq: Four times a day (QID) | RECTAL | Status: DC | PRN

## 2017-05-27 MED ORDER — INSULIN ASPART 100 UNIT/ML ~~LOC~~ SOLN
0.0000 [IU] | Freq: Every day | SUBCUTANEOUS | Status: DC
Start: 2017-05-27 — End: 2017-05-30
  Administered 2017-05-28: 2 [IU] via SUBCUTANEOUS

## 2017-05-27 MED ORDER — ACETAMINOPHEN 325 MG PO TABS
650.0000 mg | ORAL_TABLET | Freq: Four times a day (QID) | ORAL | Status: DC | PRN
  Administered 2017-05-27 – 2017-05-30 (×4): 650 mg via ORAL
  Filled 2017-05-27 (×4): qty 2

## 2017-05-27 MED ORDER — FENTANYL CITRATE (PF) 100 MCG/2ML IJ SOLN
25.0000 ug | INTRAMUSCULAR | Status: DC | PRN
  Administered 2017-05-27: 25 ug via INTRAVENOUS

## 2017-05-27 MED ORDER — METOCLOPRAMIDE HCL 5 MG PO TABS: 5.0000 mg | ORAL_TABLET | Freq: Three times a day (TID) | ORAL | Status: DC | PRN

## 2017-05-27 MED ORDER — INSULIN ASPART 100 UNIT/ML ~~LOC~~ SOLN
0.0000 [IU] | Freq: Three times a day (TID) | SUBCUTANEOUS | Status: DC
  Administered 2017-05-27 – 2017-05-28 (×2): 3 [IU] via SUBCUTANEOUS
  Administered 2017-05-28: 2 [IU] via SUBCUTANEOUS
  Administered 2017-05-28: 5 [IU] via SUBCUTANEOUS
  Administered 2017-05-29: 2 [IU] via SUBCUTANEOUS
  Administered 2017-05-29: 3 [IU] via SUBCUTANEOUS
  Administered 2017-05-29: 2 [IU] via SUBCUTANEOUS
  Administered 2017-05-30: 1 [IU] via SUBCUTANEOUS

## 2017-05-27 MED ORDER — FENTANYL CITRATE (PF) 250 MCG/5ML IJ SOLN
INTRAMUSCULAR | Status: AC
  Filled 2017-05-27: qty 5

## 2017-05-27 MED ORDER — LACTATED RINGERS IV SOLN
INTRAVENOUS | Status: DC | PRN
  Administered 2017-05-27: 08:00:00 via INTRAVENOUS

## 2017-05-27 MED ORDER — SUGAMMADEX SODIUM 200 MG/2ML IV SOLN
INTRAVENOUS | Status: DC | PRN
  Administered 2017-05-27: 200 mg via INTRAVENOUS

## 2017-05-27 MED ORDER — HYDROCODONE-ACETAMINOPHEN 5-325 MG PO TABS
1.0000 | ORAL_TABLET | Freq: Four times a day (QID) | ORAL | Status: DC | PRN
  Administered 2017-05-27 – 2017-05-28 (×2): 2 via ORAL
  Administered 2017-05-28 – 2017-05-29 (×2): 1 via ORAL
  Filled 2017-05-27: qty 1
  Filled 2017-05-27 (×2): qty 2
  Filled 2017-05-27: qty 1

## 2017-05-27 MED ORDER — METOCLOPRAMIDE HCL 5 MG/ML IJ SOLN: 5.0000 mg | Freq: Three times a day (TID) | INTRAMUSCULAR | Status: DC | PRN

## 2017-05-27 MED ORDER — SUCCINYLCHOLINE CHLORIDE 200 MG/10ML IV SOSY
PREFILLED_SYRINGE | INTRAVENOUS | Status: AC
  Filled 2017-05-27: qty 10

## 2017-05-27 MED ORDER — LIDOCAINE HCL (CARDIAC) 20 MG/ML IV SOLN
INTRAVENOUS | Status: DC | PRN
  Administered 2017-05-27: 40 mg via INTRAVENOUS

## 2017-05-27 MED ORDER — SODIUM CHLORIDE 0.9 % IV SOLN
INTRAVENOUS | Status: DC | PRN
  Administered 2017-05-27: 3 [IU] via INTRAVENOUS

## 2017-05-27 MED ORDER — ONDANSETRON HCL 4 MG/2ML IJ SOLN: 4.0000 mg | Freq: Four times a day (QID) | INTRAMUSCULAR | Status: DC | PRN

## 2017-05-27 MED ORDER — MORPHINE SULFATE (PF) 2 MG/ML IV SOLN
0.5000 mg | INTRAVENOUS | Status: DC | PRN
  Administered 2017-05-28 (×2): 0.5 mg via INTRAVENOUS
  Filled 2017-05-27 (×2): qty 1

## 2017-05-27 MED ORDER — ALBUMIN HUMAN 5 % IV SOLN
INTRAVENOUS | Status: DC | PRN
  Administered 2017-05-27: 10:00:00 via INTRAVENOUS

## 2017-05-27 MED ORDER — METHOCARBAMOL 500 MG PO TABS
500.0000 mg | ORAL_TABLET | Freq: Four times a day (QID) | ORAL | Status: DC | PRN
  Administered 2017-05-27 – 2017-05-29 (×4): 500 mg via ORAL
  Filled 2017-05-27 (×4): qty 1

## 2017-05-27 MED ORDER — PHENOL 1.4 % MT LIQD: 1.0000 | OROMUCOSAL | Status: DC | PRN

## 2017-05-27 MED ORDER — PROPOFOL 10 MG/ML IV BOLUS
INTRAVENOUS | Status: DC | PRN
  Administered 2017-05-27: 60 mg via INTRAVENOUS
  Administered 2017-05-27: 20 mg via INTRAVENOUS

## 2017-05-27 SURGICAL SUPPLY — 58 items
BIT DRILL 4.3 (BIT) ×1 IMPLANT
BIT DRILL QC 3.3X195 (BIT) ×3 IMPLANT
CABLE CERLAGE W/CRIMP 1.8 (Cable) ×4 IMPLANT
CABLE CERLAGE W/CRIMP 1.8MM (Cable) ×2 IMPLANT
CAP LOCK NCB (Cap) ×9 IMPLANT
COVER BACK TABLE 24X17X13 BIG (DRAPES) ×3 IMPLANT
DRAPE C-ARM 42X72 X-RAY (DRAPES) ×3 IMPLANT
DRAPE C-ARMOR (DRAPES) ×3 IMPLANT
DRAPE IMP U-DRAPE 54X76 (DRAPES) ×3 IMPLANT
DRAPE INCISE IOBAN 66X45 STRL (DRAPES) ×3 IMPLANT
DRAPE ORTHO SPLIT 77X108 STRL (DRAPES) ×4
DRAPE POUCH INSTRU U-SHP 10X18 (DRAPES) ×3 IMPLANT
DRAPE SURG ORHT 6 SPLT 77X108 (DRAPES) ×2 IMPLANT
DRILL BIT 4.3 (BIT) ×2
DRSG AQUACEL AG ADV 3.5X14 (GAUZE/BANDAGES/DRESSINGS) ×3 IMPLANT
DURAPREP 26ML APPLICATOR (WOUND CARE) ×3 IMPLANT
ELECT CAUTERY BLADE 6.4 (BLADE) ×3 IMPLANT
ELECT REM PT RETURN 9FT ADLT (ELECTROSURGICAL) ×3
ELECTRODE REM PT RTRN 9FT ADLT (ELECTROSURGICAL) ×1 IMPLANT
FACESHIELD WRAPAROUND (MASK) ×3 IMPLANT
GAUZE XEROFORM 5X9 LF (GAUZE/BANDAGES/DRESSINGS) ×3 IMPLANT
GLOVE BIOGEL M STRL SZ7.5 (GLOVE) ×6 IMPLANT
GLOVE BIOGEL PI IND STRL 7.0 (GLOVE) ×1 IMPLANT
GLOVE BIOGEL PI IND STRL 8 (GLOVE) ×2 IMPLANT
GLOVE BIOGEL PI INDICATOR 7.0 (GLOVE) ×2
GLOVE BIOGEL PI INDICATOR 8 (GLOVE) ×4
GLOVE BIOGEL PI ORTHO PRO SZ8 (GLOVE) ×4
GLOVE PI ORTHO PRO STRL SZ8 (GLOVE) ×2 IMPLANT
GOWN STRL REUS W/ TWL LRG LVL3 (GOWN DISPOSABLE) ×2 IMPLANT
GOWN STRL REUS W/ TWL XL LVL3 (GOWN DISPOSABLE) ×2 IMPLANT
GOWN STRL REUS W/TWL LRG LVL3 (GOWN DISPOSABLE) ×4
GOWN STRL REUS W/TWL XL LVL3 (GOWN DISPOSABLE) ×4
K-WIRE 2.0 (WIRE) ×4
K-WIRE FXSTD 280X2XNS SS (WIRE) ×2
KIT BASIN OR (CUSTOM PROCEDURE TRAY) ×3 IMPLANT
KIT ROOM TURNOVER OR (KITS) ×3 IMPLANT
KWIRE FXSTD 280X2XNS SS (WIRE) ×2 IMPLANT
LOCKPLATE CABLE BUTTON NCP HIP (Orthopedic Implant) ×6 IMPLANT
NS IRRIG 1000ML POUR BTL (IV SOLUTION) ×3 IMPLANT
PACK TOTAL JOINT (CUSTOM PROCEDURE TRAY) ×3 IMPLANT
PACK UNIVERSAL I (CUSTOM PROCEDURE TRAY) ×3 IMPLANT
PAD ARMBOARD 7.5X6 YLW CONV (MISCELLANEOUS) ×6 IMPLANT
PLATE PROXIMAL FEMUR 12H RT (Plate) ×3 IMPLANT
SCREW NCB 4.0MX34M (Screw) ×3 IMPLANT
SCREW NCB 4.0MX44M (Screw) ×3 IMPLANT
SCREW NCB 4.0X40MM (Screw) ×3 IMPLANT
SCREW NCB 5.0X30MM (Screw) ×9 IMPLANT
SCREW NCB 5.0X34MM (Screw) ×3 IMPLANT
SPONGE LAP 4X18 X RAY DECT (DISPOSABLE) ×3 IMPLANT
STAPLER VISISTAT 35W (STAPLE) ×3 IMPLANT
STOCKINETTE IMPERVIOUS LG (DRAPES) ×3 IMPLANT
SUT VIC AB 0 CT1 27 (SUTURE) ×2
SUT VIC AB 0 CT1 27XBRD ANBCTR (SUTURE) ×1 IMPLANT
SUT VIC AB 2-0 CT1 27 (SUTURE) ×2
SUT VIC AB 2-0 CT1 TAPERPNT 27 (SUTURE) ×1 IMPLANT
TOWEL OR 17X24 6PK STRL BLUE (TOWEL DISPOSABLE) ×3 IMPLANT
TOWEL OR 17X26 10 PK STRL BLUE (TOWEL DISPOSABLE) ×3 IMPLANT
WATER STERILE IRR 1000ML POUR (IV SOLUTION) ×3 IMPLANT

## 2017-05-27 NOTE — Transfer of Care (Signed)
Immediate Anesthesia Transfer of Care Note  Patient: Maureen Morales  Procedure(s) Performed: OPEN REDUCTION INTERNAL FIXATION (ORIF) PERIPROSTHETIC FRACTURE (Right Leg Upper)  Patient Location: PACU  Anesthesia Type:General  Level of Consciousness: awake, alert  and oriented  Airway & Oxygen Therapy: Patient Spontanous Breathing  Post-op Assessment: Post -op Vital signs reviewed and stable  Post vital signs: stable  Last Vitals:  Vitals:   05/27/17 0042 05/27/17 0606  BP: 127/80 (!) 129/55  Pulse: 77 73  Resp:    Temp: 36.8 C 36.7 C  SpO2: 100% 100%    Last Pain:  Vitals:   05/27/17 0653  TempSrc:   PainSc: 6       Patients Stated Pain Goal: 1 (16/10/96 0454)  Complications: No apparent anesthesia complications

## 2017-05-27 NOTE — Progress Notes (Signed)
Insulin gtt from OR- see CBG - rate at 3.9- decreased to 3.5

## 2017-05-27 NOTE — Brief Op Note (Signed)
05/26/2017 - 05/27/2017  10:36 AM  PATIENT:  Maureen Morales  84 y.o. female  PRE-OPERATIVE DIAGNOSIS:  RIGHT FEMUR FRACTURE - periprosthetic  POST-OPERATIVE DIAGNOSIS: same  PROCEDURE:  Procedure(s): OPEN REDUCTION INTERNAL FIXATION (ORIF) PERIPROSTHETIC FRACTURE (Right)  SURGEON:  Surgeon(s) and Role:    Mcarthur Rossetti, MD - Primary  PHYSICIAN ASSISTANT: Benita Stabile, PA-C  ANESTHESIA:   general  EBL:  200 mL   BLOOD ADMINISTERED:500 CC PRBC  COUNTS:  YES  DICTATION: .Other Dictation: Dictation Number [id number]  PLAN OF CARE: Admit to inpatient   PATIENT DISPOSITION:  PACU - hemodynamically stable.   Delay start of Pharmacological VTE agent (>24hrs) due to surgical blood loss or risk of bleeding: no

## 2017-05-27 NOTE — Progress Notes (Signed)
Nurse with the help of a nurse Tech could not put foley in pt. .Pt refused it in the middle of insertion, will try it again when her pain get under control.thanks.

## 2017-05-27 NOTE — Progress Notes (Signed)
[place]/Insulin gtt stopped per orders by Dr Fransisco Beau- gtt to be stopped once CBG less than 200

## 2017-05-27 NOTE — Consult Note (Signed)
[facility] Nurse wound consult note Reason for Consult:posterior scalp laceration sustained during fall on concrete. Hematoma evacuated in ED, wound edges approximated with three (3) staples. Consulted for aftercare Wound type:Trauma Pressure Injury POA: NA Measurement:4cm x 0.2cm approximated laceration Wound QMK:JIZX Drainage (amount, consistency, odor) none Periwound: intact, no ecchymosis at this time Dressing procedure/placement/frequency: I will begin twice daily cleansing of the laceration with NS tomorrow follow by the application of a thin layer of bacitracin ointment as an antimicrobial. Wound is to be left open to air. Family in room, all are in agreement with POC.  [place] nursing team will not follow, but will remain available to this patient, the nursing and medical teams.  Please re-consult if needed. Thanks, Maudie Flakes, MSN, RN, [address], [facility]  Pager# [phone]

## 2017-05-27 NOTE — Progress Notes (Signed)
Orthopedic Tech Progress Note Patient Details:  Maureen Morales 11/14/1922 [id number]  Musculoskeletal Traction Type of Traction: Bucks Skin Traction Traction Location: RLE Traction Weight: 10 lbs   Post Interventions Patient Tolerated: Well Instructions Provided: Care of device   Braulio Bosch 05/27/2017, 1:54 AM

## 2017-05-27 NOTE — Progress Notes (Signed)
One unit PRBC's infusing from OR - no adverse reactions noted - infusion complete at 1110 - will have lab draw post infusion CBC in 30 min per order from Dr Fransisco Beau. No signs or symptoms of transfusion reaction noted. Pt resting comfortably. See VS

## 2017-05-27 NOTE — Progress Notes (Signed)
X-ray in progress 

## 2017-05-27 NOTE — Anesthesia Procedure Notes (Signed)
Arterial Line Insertion Performed by: Lavell Luster, CRNA, CRNA  Preanesthetic checklist: patient identified, IV checked, site marked, risks and benefits discussed, surgical consent, monitors and equipment checked and pre-op evaluation Lidocaine 1% used for infiltration Right, radial was placed Catheter size: 20 G Hand hygiene performed , maximum sterile barriers used  and Seldinger technique used Allen's test indicative of satisfactory collateral circulation Attempts: 2 Procedure performed without using ultrasound guided technique. Following insertion, dressing applied and Biopatch. Post procedure assessment: normal  Patient tolerated the procedure well with no immediate complications.

## 2017-05-27 NOTE — Op Note (Signed)
Maureen Morales NO.:  [id number]  MEDICAL RECORD NO.:  [id number]  LOCATION:  [facility]                        FACILITY:  [facility]  PHYSICIAN:  Lind Guest. Ninfa Linden, M.D.DATE OF BIRTH:  23-Jul-1922  DATE OF PROCEDURE:  05/27/2017 DATE OF DISCHARGE:                              OPERATIVE REPORT   PREOPERATIVE DIAGNOSIS:  Right periprosthetic femoral shaft fracture around a cemented right hip hemiarthroplasty.  POSTOPERATIVE DIAGNOSIS:  Right periprosthetic femoral shaft fracture around a cemented right hip hemiarthroplasty.  PROCEDURE:  Open reduction internal fixation of right femoral shaft fracture using locking plate, screws, and cables.  IMPLANTS:  Twelve-hole Zimmer NCB locking plate with locking screws proximally, cables at the fracture site, and bicortical screws distally.  SURGEON:  Lind Guest. Ninfa Linden, M.D.  ASSISTANT:  Erskine Emery, PA-C.  ANESTHESIA:  General.  ANTIBIOTICS:  IV clindamycin 900 mg.  BLOOD LOSS:  200 mL.  COMPLICATIONS:  None.  INDICATIONS:  Maureen Morales is a 84 year old female, who sustained a mechanical fall back in October of this past year and underwent a right hip hemiarthroplasty in [place], [place].  Apparently, this was by some local tenants physician and they never saw him again.  This was a cemented hemiarthroplasty.  She is a week individual, who does live at home and has had some difficulty getting around.  She sustained a mechanical accidental fall yesterday injuring her right femur.  She was seen in the [facility] Emergency Room and found to have a periprosthetic femur fracture right at the tip of the and spiraling around the prosthesis.  At this point, I have recommended she undergo open reduction internal fixation of this fracture with cable, plate, and screws, without that we would not let her put any weight on this until healing.  I think it is going to be difficult to convert this to a  long- stem prosthesis until we get good bone healing due to the nature of this fracture, the difficulty of it.  Of note, she is anemic preoperative as well and the plan would be to likely give her blood during the case as well.  I had a long and thorough discussion with the family about the risks and benefits of the surgery and they do understand proceeding with surgery in spite of the risks involved given the severity of pain she is having from the significant shorten fracture and the quality of life that is needed for reducing the fracture to help decrease her pain and improve her being able to get around and even if it just a wheelchair.  PROCEDURE DESCRIPTION:  After informed consent was obtained, appropriate right hip was marked.  She was brought to the operating room, placed supine on flat [place] operating table.  General anesthesia was then obtained.  She was then turned to lateral decubitus position with appropriate padding down on operative left leg and axillary roll was placed.  There was appropriate positioning of the head and neck and Mark II positioners in the front and the back.  Her right operative hip was prepped and draped from the pelvis area down past the knee with DuraPrep and sterile drapes.  Time-out was called  and she was identified as correct patient and correct right hip/femur.  We then made an incision laterally over the femur and carried this extensively proximally and distally.  We dissected down to the iliotibial band and was able to expose the vastus lateralis, which we slowly and meticulously divided to expose the fracture.  We then irrigated the fracture site in its entirety due to remote fracture hematoma.  We then used temporary reduction forceps, and we were able to reduce the fracture anatomic.  We then chose our 12-hole Zimmer NCB plate, which has anatomic bow as well. We placed that along the lateral femur.  We then placed 3 locking  screws proximally and 4 bicortical screws distally as well as 2 cables at the fracture site itself, tighten these down.  This was all done under direct visualization and direct fluoroscopy.  Once we felt we had good stabilization, all instrumentation was removed and then we irrigated the soft tissue with normal saline solution.  We closed the iliotibial band with a running #1 Vicryl suture, followed by 0 Vicryl in the deep tissue, 2-0 Vicryl in the subcutaneous tissue, interrupted staples on the skin.  Xeroform and a long Aquacel dressing were applied.  She was awakened, extubated, and taken to the recovery room.  All final counts were correct.  There were no complications noted.  Intraoperatively, they started to give her some blood as well, although she was not unstable hemodynamically, she was certainly anemic.  We do feel this is appropriate.  Postoperatively, she will be nonweightbearing on this right lower extremity until further notice.  Of note, Erskine Emery, PA-C, assisted in the entire case.  His assistance was crucial for facilitating all aspects of this case.     Lind Guest. Ninfa Linden, M.D.     CYB/MEDQ  D:  05/27/2017  T:  05/27/2017  Job:  [id number]

## 2017-05-27 NOTE — Anesthesia Procedure Notes (Signed)
Procedure Name: Intubation Date/Time: 05/27/2017 8:52 AM Performed by: Lavell Luster, CRNA Pre-anesthesia Checklist: Patient identified, Emergency Drugs available, Suction available, Patient being monitored and Timeout performed Patient Re-evaluated:Patient Re-evaluated prior to induction Oxygen Delivery Method: Circle system utilized Preoxygenation: Pre-oxygenation with 100% oxygen Induction Type: IV induction Ventilation: Mask ventilation without difficulty Laryngoscope Size: Mac and 3 Grade View: Grade I Tube type: Oral Tube size: 7.0 mm Number of attempts: 1 Airway Equipment and Method: Stylet Placement Confirmation: ETT inserted through vocal cords under direct vision,  positive ETCO2 and breath sounds checked- equal and bilateral Secured at: 21 cm Tube secured with: Tape Dental Injury: Teeth and Oropharynx as per pre-operative assessment

## 2017-05-27 NOTE — ED Notes (Signed)
Pt off the floor

## 2017-05-27 NOTE — Anesthesia Preprocedure Evaluation (Addendum)
Anesthesia Evaluation  Patient identified by MRN, date of birth, ID band Patient awake    Reviewed: Allergy & Precautions, NPO status , Patient's Chart, lab work & pertinent test results, reviewed documented beta blocker date and time   Airway Mallampati: II  TM Distance: >3 FB Neck ROM: Full    Dental  (+) Dental Advisory Given   Pulmonary neg pulmonary ROS,    Pulmonary exam normal breath sounds clear to auscultation       Cardiovascular hypertension, Pt. on medications and Pt. on home beta blockers + CAD and + Past MI  Normal cardiovascular exam Rhythm:Regular Rate:Normal  HLD   Neuro/Psych Depression negative neurological ROS     GI/Hepatic Neg liver ROS, Rectal stent   Endo/Other  diabetes, Poorly Controlled, Type 2, Oral Hypoglycemic Agents  Renal/GU negative Renal ROS  negative genitourinary   Musculoskeletal negative musculoskeletal ROS (+)   Abdominal   Peds  Hematology  (+) anemia ,   Anesthesia Other Findings   Reproductive/Obstetrics                           Anesthesia Physical Anesthesia Plan  ASA: IV  Anesthesia Plan: General   Post-op Pain Management:    Induction: Intravenous  PONV Risk Score and Plan: 3 and Treatment may vary due to age or medical condition, Ondansetron and Propofol infusion  Airway Management Planned: Oral ETT  Additional Equipment: Arterial line  Intra-op Plan:   Post-operative Plan: Extubation in OR  Informed Consent: I have reviewed the patients History and Physical, chart, labs and discussed the procedure including the risks, benefits and alternatives for the proposed anesthesia with the patient or authorized representative who has indicated his/her understanding and acceptance.   Dental advisory given  Plan Discussed with: CRNA  Anesthesia Plan Comments: (BS 349, will treat with insulin bolus and infusion intraop. Given recent MI,  will place arterial line preoperatively. Discussed with daughter at bedside, patient accepting of intubation for procedure, just does not want to be on mechanical ventilation for prolonged period.)       Anesthesia Quick Evaluation

## 2017-05-27 NOTE — Progress Notes (Addendum)
PROGRESS NOTE    Maureen Morales  LZJ:[id number] DOB: Dec 29, 1922 DOA: 05/26/2017 PCP: Patient, No Pcp Per  Outpatient Specialists:   Brief Narrative: Patient is a 84 y.o. female with past medical history significant for hypertension, hyperlipidemia, depression, and CAD (had heart attack 02/11/17).  Patient was admitted following a fall, with associated right hip pain.  X-ray done revealed acute periprosthetic fracture involving the proximal right femur with displacement.  The patient underwent open reduction and internal fixation today.  The patient was seen alongside patient's sister-in-law.  Currently, the patient is resting quietly.  Orthopedic input is highly appreciated.  I also updated the patient's son.  The patient will be managed expectantly.  Considering patient's age, and other co morbidities, I communicated to the patient's son and/or their family member around that the patient's postop course may be complicated.   Assessment & Plan:   Principal Problem:   Closed right hip fracture (HCC) Active Problems:   Depression   HLD (hyperlipidemia)   Essential hypertension   Fall   Normocytic anemia   Abdominal distention   CAD (coronary artery disease) Possible rectal mass Uncontrolled blood sugar  Monitor patient expectantly. Adequate analgesia, to avoid sympathetic activity. Optimize blood pressure. Check HbA1c Accuchecks and SSI coverage - Consider GI referral when patient is more stable to further investigate possible rectal mass (depending on goal of care) - Continue with her blood counts for cardio prophylaxis if the blood pressure permits.  For them management will depend on hospital course.  DVT prophylaxis: Will defer to the orthopedic team.   Code Status: Partial. Family Communication: Son-in-law definitely members around. Disposition Plan: Will depend on hospital course.   Consultants:   Orthopedics.  Procedures:   Open reduction and internal  fixation.  Antimicrobials:   None.   Subjective: Patient is resting quietly. The patient has just returned from the OR.  Objective: Vitals:   05/26/17 2357 05/27/17 0000 05/27/17 0042 05/27/17 0606  BP: (!) 140/54 (!) 143/63 127/80 (!) 129/55  Pulse: 82 80 77 73  Resp:      Temp:   98.2 F (36.8 C) 98 F (36.7 C)  TempSrc:   Axillary Axillary  SpO2: 100% 100% 100% 100%    Intake/Output Summary (Last 24 hours) at 05/27/2017 0240 Last data filed at 05/27/2017 0400 Gross per 24 hour  Intake 630 ml  Output -  Net 630 ml   There were no vitals filed for this visit.  Examination:  General exam: He is sleeping quietly.   Respiratory system: Clear to auscultation.  Cardiovascular system: S1 & S2, ejection systolic murmur.  No pedal edema. Gastrointestinal system: Abdomen is nondistended, soft and nontender. No organomegaly or masses felt. Normal bowel sounds heard. Central nervous system: Sleeping at the moment. Extremities: No leg edema.  Data Reviewed: I have personally reviewed following labs and imaging studies  CBC: Recent Labs  Lab 05/26/17 1915  WBC 11.4*  NEUTROABS 6.8  HGB 9.6*  HCT 29.4*  MCV 84.7  PLT 973   Basic Metabolic Panel: Recent Labs  Lab 05/26/17 1915  NA 139  K 3.6  CL 107  CO2 22  GLUCOSE 319*  BUN 37*  CREATININE 1.00  CALCIUM 9.1   GFR: CrCl cannot be calculated (Unknown ideal weight.). Liver Function Tests: No results for input(s): AST, ALT, ALKPHOS, BILITOT, PROT, ALBUMIN in the last 168 hours. No results for input(s): LIPASE, AMYLASE in the last 168 hours. No results for input(s): AMMONIA in the last 168  hours. Coagulation Profile: Recent Labs  Lab 05/26/17 1915  INR 1.26   Cardiac Enzymes: No results for input(s): CKTOTAL, CKMB, CKMBINDEX, TROPONINI in the last 168 hours. BNP (last 3 results) No results for input(s): PROBNP in the last 8760 hours. HbA1C: No results for input(s): HGBA1C in the last 72  hours. CBG: No results for input(s): GLUCAP in the last 168 hours. Lipid Profile: No results for input(s): CHOL, HDL, LDLCALC, TRIG, CHOLHDL, LDLDIRECT in the last 72 hours. Thyroid Function Tests: No results for input(s): TSH, T4TOTAL, FREET4, T3FREE, THYROIDAB in the last 72 hours. Anemia Panel: Recent Labs    05/27/17 0210  VITAMINB12 678  FOLATE 12.1  FERRITIN 192  TIBC 350  IRON 28  RETICCTPCT 1.2   Urine analysis:    Component Value Date/Time   COLORURINE YELLOW 05/27/2017 0714   APPEARANCEUR CLEAR 05/27/2017 0714   LABSPEC 1.023 05/27/2017 0714   PHURINE 5.0 05/27/2017 0714   GLUCOSEU >=500 (A) 05/27/2017 0714   HGBUR NEGATIVE 05/27/2017 0714   BILIRUBINUR NEGATIVE 05/27/2017 0714   KETONESUR NEGATIVE 05/27/2017 0714   PROTEINUR 30 (A) 05/27/2017 0714   NITRITE NEGATIVE 05/27/2017 0714   LEUKOCYTESUR NEGATIVE 05/27/2017 0714   Sepsis Labs: @LABRCNTIP (procalcitonin:4,lacticidven:4)  )No results found for this or any previous visit (from the past 240 hour(s)).       Radiology Studies: Dg Shoulder Right  Result Date: 05/26/2017 CLINICAL DATA:  Fall.  Right shoulder pain.  Initial encounter. EXAM: RIGHT SHOULDER - 2+ VIEW COMPARISON:  None. FINDINGS: There is no evidence of acute fracture or dislocation. Generalized osteopenia noted. Soft tissues are unremarkable. IMPRESSION: No acute findings. Electronically Signed   By: Earle Gell M.D.   On: 05/26/2017 23:10   Ct Head Wo Contrast  Result Date: 05/26/2017 CLINICAL DATA:  Sneezed then fell to the ground, no loss of consciousness, possible small skull deformity of back of head, RIGHT hip deformity, initial encounter EXAM: CT HEAD WITHOUT CONTRAST CT CERVICAL SPINE WITHOUT CONTRAST TECHNIQUE: Multidetector CT imaging of the head and cervical spine was performed following the standard protocol without intravenous contrast. Multiplanar CT image reconstructions of the cervical spine were also generated. COMPARISON:   None FINDINGS: CT HEAD FINDINGS Brain: Mild generalized atrophy. Normal ventricular morphology. No midline shift or mass effect. Mild small vessel chronic ischemic changes of periventricular white matter in the frontal lobes. No intracranial hemorrhage, mass lesion, or evidence of acute infarction. No extra-axial fluid collections. Vascular: Extensive atherosclerotic calcification of internal carotid arteries at skull base Skull: Calvaria intact.  RIGHT parietooccipital scalp hematoma. Sinuses/Orbits: Visualized paranasal sinuses and mastoid air cells clear Other: N/A CT CERVICAL SPINE FINDINGS Alignment: Normal Skull base and vertebrae: Osseous demineralization. Visualized skull base intact. Vertebral body heights maintained without fracture or bone destruction. Multilevel disc space narrowing and endplate spur formation. Multilevel facet degenerative changes. Encroachment upon RIGHT cervical neural foramina at C4-C5 by facet hypertrophy. Soft tissues and spinal canal: Prevertebral soft tissues normal thickness. Remaining visualized cervical soft tissues unremarkable. Disc levels:  No additional abnormalities Upper chest: Emphysematous changes at lung apices. 4 mm RIGHT upper lobe nodule image 80. Minimal scattered interseptal thickening. Other: Atherosclerotic calcifications of the carotid bifurcations, proximal great vessels and aortic arch. IMPRESSION: Atrophy with mild small vessel chronic ischemic changes of deep cerebral white matter. No acute intracranial abnormalities. Degenerative disc and facet disease changes of the cervical spine. No acute cervical spine abnormalities. 4 mm RIGHT upper lobe pulmonary nodule, recommendation below. No follow-up needed if patient is  low-risk. Non-contrast chest CT can be considered in 12 months if patient is high-risk. This recommendation follows the consensus statement: Guidelines for Management of Incidental Pulmonary Nodules Detected on CT Images: From the Fleischner  Society 2017; Radiology 2017; 284:228-243. Electronically Signed   By: Lavonia Dana M.D.   On: 05/26/2017 20:22   Ct Cervical Spine Wo Contrast  Result Date: 05/26/2017 CLINICAL DATA:  Sneezed then fell to the ground, no loss of consciousness, possible small skull deformity of back of head, RIGHT hip deformity, initial encounter EXAM: CT HEAD WITHOUT CONTRAST CT CERVICAL SPINE WITHOUT CONTRAST TECHNIQUE: Multidetector CT imaging of the head and cervical spine was performed following the standard protocol without intravenous contrast. Multiplanar CT image reconstructions of the cervical spine were also generated. COMPARISON:  None FINDINGS: CT HEAD FINDINGS Brain: Mild generalized atrophy. Normal ventricular morphology. No midline shift or mass effect. Mild small vessel chronic ischemic changes of periventricular white matter in the frontal lobes. No intracranial hemorrhage, mass lesion, or evidence of acute infarction. No extra-axial fluid collections. Vascular: Extensive atherosclerotic calcification of internal carotid arteries at skull base Skull: Calvaria intact.  RIGHT parietooccipital scalp hematoma. Sinuses/Orbits: Visualized paranasal sinuses and mastoid air cells clear Other: N/A CT CERVICAL SPINE FINDINGS Alignment: Normal Skull base and vertebrae: Osseous demineralization. Visualized skull base intact. Vertebral body heights maintained without fracture or bone destruction. Multilevel disc space narrowing and endplate spur formation. Multilevel facet degenerative changes. Encroachment upon RIGHT cervical neural foramina at C4-C5 by facet hypertrophy. Soft tissues and spinal canal: Prevertebral soft tissues normal thickness. Remaining visualized cervical soft tissues unremarkable. Disc levels:  No additional abnormalities Upper chest: Emphysematous changes at lung apices. 4 mm RIGHT upper lobe nodule image 80. Minimal scattered interseptal thickening. Other: Atherosclerotic calcifications of the carotid  bifurcations, proximal great vessels and aortic arch. IMPRESSION: Atrophy with mild small vessel chronic ischemic changes of deep cerebral white matter. No acute intracranial abnormalities. Degenerative disc and facet disease changes of the cervical spine. No acute cervical spine abnormalities. 4 mm RIGHT upper lobe pulmonary nodule, recommendation below. No follow-up needed if patient is low-risk. Non-contrast chest CT can be considered in 12 months if patient is high-risk. This recommendation follows the consensus statement: Guidelines for Management of Incidental Pulmonary Nodules Detected on CT Images: From the Fleischner Society 2017; Radiology 2017; 284:228-243. Electronically Signed   By: Lavonia Dana M.D.   On: 05/26/2017 20:22   Ct Abdomen Pelvis W Contrast  Result Date: 05/27/2017 CLINICAL DATA:  Abdominal distension EXAM: CT ABDOMEN AND PELVIS WITH CONTRAST TECHNIQUE: Multidetector CT imaging of the abdomen and pelvis was performed using the standard protocol following bolus administration of intravenous contrast. CONTRAST:  80 mL ISOVUE-300 IOPAMIDOL (ISOVUE-300) INJECTION 61% COMPARISON:  11/27/2016, 11/01/2016, 12/08/2013 FINDINGS: Lower chest: Patchy dependent atelectasis. Cardiomegaly with coronary calcification. Hepatobiliary: No focal hepatic abnormality. Stable mild intra and extrahepatic biliary enlargement status post cholecystectomy Pancreas: Unremarkable. No pancreatic ductal dilatation or surrounding inflammatory changes. Spleen: Normal in size without focal abnormality. Adrenals/Urinary Tract: Adrenal glands are within normal limits. No hydronephrosis. Multiple cysts in the kidneys with additional subcentimeter hypodense lesions too small to characterize. The bladder is normal Stomach/Bowel: The stomach is nonenlarged. No dilated small bowel. No colon wall thickening. Sigmoid colon diverticular disease without acute inflammation. Again noted is asymmetric masslike wall thickening of the  rectum, series 3, image number 84. normal appendix Vascular/Lymphatic: Moderate aortic atherosclerosis. No aneurysmal dilatation. No significantly enlarged lymph nodes. Reproductive: Status post hysterectomy. No adnexal masses. Other: Negative  for free air or significant free fluid. Musculoskeletal: Status post right hip replacement with artifact. Degenerative changes of the spine with chronic superior endplate deformity at L4. Stable grade 1 anterolisthesis of L4 on L5. IMPRESSION: 1. Negative for bowel obstruction or acute abnormality 2. Stable asymmetric wall thickening of the left rectum suspicious for mass lesion, correlation with direct visualization previously recommended 3. Multiple hypodense renal lesions, probably cysts 4. Sigmoid colon diverticular disease without acute inflammation Electronically Signed   By: Donavan Foil M.D.   On: 05/27/2017 00:51   Dg Hip Port Unilat With Pelvis 1v Right  Result Date: 05/26/2017 CLINICAL DATA:  Fall, right hip pain EXAM: DG HIP (WITH OR WITHOUT PELVIS) 1V PORT RIGHT COMPARISON:  None. FINDINGS: No pelvic diastasis. Right hip hemiarthroplasty. Acute oblique periprosthetic fracture in the proximal right femur with 1 shaft's with medial displacement of the distal fracture fragment and with overriding of the fracture fragments. No evidence of dislocation at the hip joints on these frontal views. No suspicious focal osseous lesions. Degenerative changes in the visualized lower lumbar spine. IMPRESSION: Right hip hemiarthroplasty. Acute periprosthetic fracture in the proximal right femur with displacement and overriding as detailed. No evidence of hip dislocation on these frontal views. Electronically Signed   By: Ilona Sorrel M.D.   On: 05/26/2017 20:03        Scheduled Meds: . [MAR Hold] atorvastatin  40 mg Oral Daily  . [MAR Hold] azithromycin  250 mg Oral Daily  . [MAR Hold] fenofibrate  160 mg Oral Daily  . HYDROmorphone      . [MAR Hold] loratadine   10 mg Oral Daily  . [MAR Hold] losartan  100 mg Oral Daily  . [MAR Hold] metoprolol succinate  50 mg Oral Daily  . [MAR Hold] sertraline  50 mg Oral Daily  . [MAR Hold] vitamin C  500 mg Oral BID   Continuous Infusions: . sodium chloride 75 mL/hr at 05/26/17 1936  . insulin (NOVOLIN-R) infusion       LOS: 1 day    Time spent: 47 Minutes    Dana Allan, MD  [facility] Pager #: [phone] 7PM-7AM contact night coverage as above

## 2017-05-27 NOTE — Anesthesia Postprocedure Evaluation (Signed)
Anesthesia Post Note  Patient: RILLA BUCKMAN  Procedure(s) Performed: OPEN REDUCTION INTERNAL FIXATION (ORIF) PERIPROSTHETIC FRACTURE (Right Leg Upper)     Patient location during evaluation: PACU Anesthesia Type: General Level of consciousness: awake and alert Pain management: pain level controlled Vital Signs Assessment: post-procedure vital signs reviewed and stable Respiratory status: spontaneous breathing, nonlabored ventilation and respiratory function stable Cardiovascular status: blood pressure returned to baseline and stable Postop Assessment: no apparent nausea or vomiting Anesthetic complications: no    Last Vitals:  Vitals:   05/27/17 1231 05/27/17 1300  BP: 97/77 (!) 119/42  Pulse: 73 76  Resp: 12 14  Temp:  37.3 C  SpO2: 100% 100%    Last Pain:  Vitals:   05/27/17 1300  TempSrc: Oral  PainSc:                  Audry Pili

## 2017-05-27 NOTE — Progress Notes (Signed)
Attempt to call report.

## 2017-05-27 NOTE — Progress Notes (Signed)
Patient ID: Maureen Morales, female   DOB: 11-Jan-1923, 84 y.o.   MRN: [id number] I have spoken to the family at the bedside.  They understand fully the complexity of the patient's right femur fracture.  Surgery will be today.  The risks and benefits have been discussed in detail.  Surgery is being done due to severe malalignment of her right thigh that is causing soft-tissue compromise.

## 2017-05-28 ENCOUNTER — Encounter: Payer: Self-pay | Admitting: Internal Medicine

## 2017-05-28 ENCOUNTER — Encounter (HOSPITAL_COMMUNITY): Payer: Self-pay | Admitting: General Practice

## 2017-05-28 ENCOUNTER — Inpatient Hospital Stay (HOSPITAL_COMMUNITY): Payer: Medicare Other

## 2017-05-28 DIAGNOSIS — I10 Essential (primary) hypertension: Secondary | ICD-10-CM

## 2017-05-28 DIAGNOSIS — E119 Type 2 diabetes mellitus without complications: Secondary | ICD-10-CM

## 2017-05-28 DIAGNOSIS — I2510 Atherosclerotic heart disease of native coronary artery without angina pectoris: Secondary | ICD-10-CM

## 2017-05-28 DIAGNOSIS — I361 Nonrheumatic tricuspid (valve) insufficiency: Secondary | ICD-10-CM

## 2017-05-28 DIAGNOSIS — N179 Acute kidney failure, unspecified: Secondary | ICD-10-CM

## 2017-05-28 DIAGNOSIS — W19XXXD Unspecified fall, subsequent encounter: Secondary | ICD-10-CM

## 2017-05-28 DIAGNOSIS — G8918 Other acute postprocedural pain: Secondary | ICD-10-CM

## 2017-05-28 DIAGNOSIS — D62 Acute posthemorrhagic anemia: Secondary | ICD-10-CM

## 2017-05-28 DIAGNOSIS — S72001D Fracture of unspecified part of neck of right femur, subsequent encounter for closed fracture with routine healing: Secondary | ICD-10-CM

## 2017-05-28 LAB — GLUCOSE, CAPILLARY
GLUCOSE-CAPILLARY: 349 mg/dL — AB (ref 65–99)
GLUCOSE-CAPILLARY: 188 mg/dL — AB (ref 65–99)
GLUCOSE-CAPILLARY: 249 mg/dL — AB (ref 65–99)
GLUCOSE-CAPILLARY: 239 mg/dL — AB (ref 65–99)
Glucose-Capillary: 294 mg/dL — ABNORMAL HIGH (ref 65–99)

## 2017-05-28 LAB — BASIC METABOLIC PANEL
Anion gap: 7 (ref 5–15)
BUN: 32 mg/dL — AB (ref 6–20)
CALCIUM: 8.3 mg/dL — AB (ref 8.9–10.3)
CO2: 21 mmol/L — ABNORMAL LOW (ref 22–32)
CREATININE: 1.31 mg/dL — AB (ref 0.44–1.00)
Chloride: 110 mmol/L (ref 101–111)
GFR calc Af Amer: 40 mL/min — ABNORMAL LOW (ref >60)
GFR, EST NON AFRICAN AMERICAN: 34 mL/min — AB (ref >60)
GLUCOSE: 193 mg/dL — AB (ref 65–99)
Potassium: 3.6 mmol/L (ref 3.5–5.1)
SODIUM: 138 mmol/L (ref 135–145)

## 2017-05-28 LAB — CBC
HCT: 22.9 % — ABNORMAL LOW (ref 36.0–46.0)
Hemoglobin: 7.7 g/dL — ABNORMAL LOW (ref 12.0–15.0)
MCH: 28.8 pg (ref 26.0–34.0)
MCHC: 33.6 g/dL (ref 30.0–36.0)
MCV: 85.8 fL (ref 78.0–100.0)
PLATELETS: 174 10*3/uL (ref 150–400)
RBC: 2.67 MIL/uL — ABNORMAL LOW (ref 3.87–5.11)
RDW: 16 % — AB (ref 11.5–15.5)
WBC: 7.7 10*3/uL (ref 4.0–10.5)

## 2017-05-28 LAB — POCT I-STAT 4, (NA,K, GLUC, HGB,HCT)
GLUCOSE: 269 mg/dL — AB (ref 65–99)
HCT: 19 % — ABNORMAL LOW (ref 36.0–46.0)
Hemoglobin: 6.5 g/dL — CL (ref 12.0–15.0)
Potassium: 3.4 mmol/L — ABNORMAL LOW (ref 3.5–5.1)
Sodium: 144 mmol/L (ref 135–145)

## 2017-05-28 LAB — HEMOGLOBIN A1C
Hgb A1c MFr Bld: 7 % — ABNORMAL HIGH (ref 4.8–5.6)
Mean Plasma Glucose: 154.2 mg/dL

## 2017-05-28 LAB — AFP TUMOR MARKER: AFP, Serum, Tumor Marker: 1.7 ng/mL (ref 0.0–8.3)

## 2017-05-28 LAB — ECHOCARDIOGRAM COMPLETE

## 2017-05-28 LAB — CEA: CEA: 2.1 ng/mL (ref 0.0–4.7)

## 2017-05-28 MED ORDER — WHITE PETROLATUM EX OINT
TOPICAL_OINTMENT | CUTANEOUS | Status: AC
  Filled 2017-05-28: qty 28.35

## 2017-05-28 NOTE — Evaluation (Signed)
Occupational Therapy Evaluation Patient Details Name: Maureen Morales MRN: [id number] DOB: 1922-05-14 Today's Date: 05/28/2017    History of Present Illness Pt is a 84 y/o F past medical history significant for hypertension, hyperlipidemia, depression, CAD (had heart attack 02/11/17) and history of a right hip hemiarthroplasty performed in [place] in October of this past year.  Patient was admitted following a mechanical fall, with associated right hip pain.  X-ray revealed acute periprosthetic fracture involving the proximal right femur with displacement. Pt is now s/p ORIF periprosthetic fracture    Clinical Impression   This 84 y/o F presents with the above. At baseline Pt is mod independent with functional mobility using RW and receiving some assist for ADLs from personal care aide. Pt initially sleepy upon entering room though is motivated and willing to work with therapy during session. Pt presenting with increased pain and weakness, decreased dynamic balance and functional performance. Pt requires MaxA for LB ADLs and MaxA+2 for stand pivot to recliner this session, demonstrating good maintenance of NWB during transfer completion. Based upon Pt's level of function PTA and Pt's progress during initial eval feel Pt is excellent candidate for CIR level services. Will continue to follow acutely to maximize her safety and independence with ADLs and mobility prior to discharge.       Follow Up Recommendations  CIR;Supervision/Assistance - 24 hour    Equipment Recommendations  Other (comment)(defer to next venue )    Recommendations for Other Services Rehab consult     Precautions / Restrictions Precautions Precautions: Fall Restrictions Weight Bearing Restrictions: Yes RLE Weight Bearing: Non weight bearing      Mobility Bed Mobility Overal bed mobility: Needs Assistance Bed Mobility: Supine to Sit     Supine to sit: Mod assist;+2 for physical assistance;HOB elevated      General bed mobility comments: VC's for use of rails for support. Assist for LE advancement towards EOB and scooting assist provided with bed pad. Pt was able to scoot herself out of EOB once in an upright seated position.   Transfers Overall transfer level: Needs assistance Equipment used: Rolling walker (2 wheeled) Transfers: Sit to/from [facility] Sit to Stand: Mod assist;+2 physical assistance Stand pivot transfers: Max assist;+2 physical assistance       General transfer comment: VC's for maintenance of NWB status on the RLE. Noted pt holding RLE up in the air throughout entire transfer. Pt was able to advance L foot laterally using a heel scoot and then toe scoot method, but once turned, pt unable to gain enough leverage with UE's to hop backwards towards chair. Max assist provided overall for transition safely to chair.     Balance Overall balance assessment: Needs assistance Sitting-balance support: Feet supported;No upper extremity supported Sitting balance-Leahy Scale: Fair     Standing balance support: Bilateral upper extremity supported;During functional activity Standing balance-Leahy Scale: Poor Standing balance comment: Reliant on UE support on walker.                            ADL either performed or assessed with clinical judgement   ADL Overall ADL's : Needs assistance/impaired Eating/Feeding: Set up;Sitting   Grooming: Set up;Sitting;Wash/dry face   Upper Body Bathing: Minimal assistance;Sitting   Lower Body Bathing: Moderate assistance;+2 for physical assistance;+2 for safety/equipment;Sit to/from stand   Upper Body Dressing : Minimal assistance;Sitting   Lower Body Dressing: +2 for physical assistance;+2 for safety/equipment;Maximal assistance;Sit to/from stand  Toilet Transfer: +2 for safety/equipment;+2 for physical assistance;Stand-pivot;BSC;RW;Maximal assistance Toilet Transfer Details (indicate cue type and reason):  simulated in transfer EOB>recliner  Toileting- Clothing Manipulation and Hygiene: Maximal assistance;+2 for physical assistance;+2 for safety/equipment;Sit to/from stand       Functional mobility during ADLs: Maximal assistance;+2 for physical assistance;+2 for safety/equipment;Rolling walker                           Pertinent Vitals/Pain Pain Assessment: Faces Faces Pain Scale: Hurts even more Pain Location: RLE Pain Descriptors / Indicators: Operative site guarding;Discomfort;Grimacing Pain Intervention(s): Limited activity within patient's tolerance;Monitored during session;Repositioned     Hand Dominance     Extremity/Trunk Assessment Upper Extremity Assessment Upper Extremity Assessment: Generalized weakness   Lower Extremity Assessment Lower Extremity Assessment: Defer to PT evaluation RLE Deficits / Details: Decreased strength and AROM consistent with above mentioned procedure.    Cervical / Trunk Assessment Cervical / Trunk Assessment: Kyphotic   Communication Communication Communication: HOH   Cognition Arousal/Alertness: Awake/alert(sleepy but awake and participating during session) Behavior During Therapy: WFL for tasks assessed/performed Overall Cognitive Status: Impaired/Different from baseline Area of Impairment: Attention;Memory;Following commands;Safety/judgement;Awareness;Problem solving                   Current Attention Level: Sustained Memory: Decreased short-term memory;Decreased recall of precautions Following Commands: Follows one step commands consistently;Follows one step commands with increased time;Follows multi-step commands inconsistently Safety/Judgement: Decreased awareness of safety;Decreased awareness of deficits Awareness: Intellectual Problem Solving: Slow processing;Decreased initiation;Difficulty sequencing;Requires verbal cues General Comments: Pt perseverating on whether daughter slept well overnight. Noted decreased  recall of WB precautions when asked verbally however maintained NWB status well. At end of session pt asking whether she is going home today.                     Home Living Family/patient expects to be discharged to:: Private residence Living Arrangements: Spouse/significant other Available Help at Discharge: Family;Personal care attendant                         Home Equipment: Gilford Rile - 2 wheels;Toilet riser          Prior Functioning/Environment Level of Independence: Needs assistance  Gait / Transfers Assistance Needed: using RW for ambulation ADL's / Homemaking Assistance Needed: Pt receiving assist from personal care aide who were supervising/assisting with bathing and LB dressing            OT Problem List: Decreased strength;Decreased activity tolerance;Decreased cognition;Pain;Impaired balance (sitting and/or standing);Decreased knowledge of precautions;Decreased knowledge of use of DME or AE      OT Treatment/Interventions: Self-care/ADL training;DME and/or AE instruction;Therapeutic activities;Balance training;Therapeutic exercise;Energy conservation;Patient/family education    OT Goals(Current goals can be found in the care plan section) Acute Rehab OT Goals Patient Stated Goal: Pt did not state goals. Daughter's goal is for pt to d/c to CIR and then return home with husband and aide support when able OT Goal Formulation: With patient/family Time For Goal Achievement: 06/11/17 Potential to Achieve Goals: Good  OT Frequency: Min 2X/week               Co-evaluation PT/OT/SLP Co-Evaluation/Treatment: Yes Reason for Co-Treatment: Necessary to address cognition/behavior during functional activity;For patient/therapist safety;To address functional/ADL transfers PT goals addressed during session: Mobility/safety with mobility;Balance;Proper use of DME OT goals addressed during session: ADL's and self-care;Proper use of Adaptive equipment and DME  AM-PAC PT "6 Clicks" Daily Activity     Outcome Measure Help from another person eating meals?: None Help from another person taking care of personal grooming?: A Little Help from another person toileting, which includes using toliet, bedpan, or urinal?: A Lot Help from another person bathing (including washing, rinsing, drying)?: A Lot Help from another person to put on and taking off regular upper body clothing?: A Little Help from another person to put on and taking off regular lower body clothing?: A Lot 6 Click Score: 16   End of Session Equipment Utilized During Treatment: Gait belt;Rolling walker Nurse Communication: Mobility status  Activity Tolerance: Patient tolerated treatment well Patient left: in chair;with call bell/phone within reach;with chair alarm set;with family/visitor present  OT Visit Diagnosis: Unsteadiness on feet (R26.81);History of falling (Z91.81);Muscle weakness (generalized) (M62.81);Pain Pain - Right/Left: Right Pain - part of body: Hip                Time: 2505-3976 OT Time Calculation (min): 32 min Charges:  OT General Charges $OT Visit: 1 Visit OT Evaluation $OT Eval Moderate Complexity: 1 Mod G-Codes:     Lou Cal, OT Pager [phone] 05/28/2017   Raymondo Band 05/28/2017, 9:50 AM

## 2017-05-28 NOTE — Progress Notes (Signed)
PROGRESS NOTE    Maureen Morales  GHW:[id number] DOB: [birth year] DOA: 05/26/2017 PCP: Patient, No Pcp Per  Outpatient Specialists:   Brief Narrative: Patient is a 84 y.o. female with past medical history significant for hypertension, hyperlipidemia, depression, and CAD (had heart attack 02/11/17).  Patient was admitted following a fall, with associated right hip pain.  X-ray done revealed acute periprosthetic fracture involving the proximal right femur with displacement.  The patient underwent open reduction and internal fixation.  Discussed antiplatelet therapy with the orthopedic surgeon.  Also discussed exam with the patient and the patient's doctor.  The patient tells me that she had significant bleeding while on Plavix.  The orthopedic team is inclined towards not giving the patient any and platelet.  We will hold off for now.  Physical therapy team is working with the patient.  The patient will likely be discharged to a rehab facility.  The patient's pain is well controlled.   Assessment & Plan:   Principal Problem:   Closed right femur fracture  Active Problems:   Depression   HLD (hyperlipidemia)   Essential hypertension   Fall   Normocytic anemia   Abdominal distention   CAD (coronary artery disease) Possible rectal mass Uncontrolled blood sugar   Closed right peri-prosthetic femoral fracture. - Patient is status post open reduction and internal fixation.   -Adequate analgesia. -Continue the blocker. -Pursue rehabilitation once cleared by orthopedic team.  Coronary artery disease. - This is stable. -No chest pain.  No shortness of breath. -Due to prior history of a significant GI bleed, the patient's Plavix has been discontinued.  Possible rectal mass. -This will need to be pursued for the after the patient gets through this acute phase. -Extent of workup will depend on the goal of care.  Likely newly diagnosed diabetes mellitus. -Continue Accu-Cheks. -Change diet to  consistent carbohydrate diet. -Consider diabetic teaching in the morning. -Continue sliding scale insulin coverage for now.  For them management will depend on hospital course.  DVT prophylaxis: Will defer to the orthopedic team.   Code Status: Partial. Family Communication: Son-in-law definitely members around. Disposition Plan: Will depend on hospital course.   Consultants:   Orthopedics.  Procedures:   Open reduction and internal fixation.  Antimicrobials:   None.   Subjective: Patient has no complaints. No chest pain no shortness of breath.  Objective: Vitals:   05/27/17 1300 05/27/17 2033 05/28/17 0455 05/28/17 1300  BP: (!) 119/42 (!) 116/37 (!) 133/45 (!) 123/41  Pulse: 76 80 81 91  Resp: 14 15 16 16   Temp: 99.1 F (37.3 C) 100.2 F (37.9 C) 98.6 F (37 C) 99.9 F (37.7 C)  TempSrc: Oral Axillary Oral Oral  SpO2: 100% 100% 99% 93%    Intake/Output Summary (Last 24 hours) at 05/28/2017 1734 Last data filed at 05/28/2017 1428 Gross per 24 hour  Intake 768 ml  Output 750 ml  Net 18 ml   There were no vitals filed for this visit.  Examination:  General exam: Awake and alert.  Comfortable.  Not seen any obvious distress.   Respiratory system: Clear to auscultation.  Cardiovascular system: S1 & S2, ejection systolic murmur.  No pedal edema. Gastrointestinal system: Abdomen is nondistended, soft and nontender. No organomegaly or masses felt. Normal bowel sounds heard. Central nervous system: Sleeping at the moment. Extremities: No leg edema.  Data Reviewed: I have personally reviewed following labs and imaging studies  CBC: Recent Labs  Lab 05/26/17 1915 05/27/17 1022 05/27/17 1135  05/28/17 0724  WBC 11.4*  --  18.7* 7.7  NEUTROABS 6.8  --   --   --   HGB 9.6* 6.5* 8.7* 7.7*  HCT 29.4* 19.0* 26.6* 22.9*  MCV 84.7  --  84.4 85.8  PLT 320  --  264 607   Basic Metabolic Panel: Recent Labs  Lab 05/26/17 1915 05/27/17 1022 05/28/17 0724  NA  139 144 138  K 3.6 3.4* 3.6  CL 107  --  110  CO2 22  --  21*  GLUCOSE 319* 269* 193*  BUN 37*  --  32*  CREATININE 1.00  --  1.31*  CALCIUM 9.1  --  8.3*   GFR: CrCl cannot be calculated (Unknown ideal weight.). Liver Function Tests: No results for input(s): AST, ALT, ALKPHOS, BILITOT, PROT, ALBUMIN in the last 168 hours. No results for input(s): LIPASE, AMYLASE in the last 168 hours. No results for input(s): AMMONIA in the last 168 hours. Coagulation Profile: Recent Labs  Lab 05/26/17 1915  INR 1.26   Cardiac Enzymes: No results for input(s): CKTOTAL, CKMB, CKMBINDEX, TROPONINI in the last 168 hours. BNP (last 3 results) No results for input(s): PROBNP in the last 8760 hours. HbA1C: Recent Labs    05/28/17 0724  HGBA1C 7.0*   CBG: Recent Labs  Lab 05/27/17 1630 05/27/17 2033 05/28/17 0715 05/28/17 1205 05/28/17 1609  GLUCAP 222* 175* 188* 249* 294*   Lipid Profile: No results for input(s): CHOL, HDL, LDLCALC, TRIG, CHOLHDL, LDLDIRECT in the last 72 hours. Thyroid Function Tests: No results for input(s): TSH, T4TOTAL, FREET4, T3FREE, THYROIDAB in the last 72 hours. Anemia Panel: Recent Labs    05/27/17 0210  VITAMINB12 678  FOLATE 12.1  FERRITIN 192  TIBC 350  IRON 28  RETICCTPCT 1.2   Urine analysis:    Component Value Date/Time   COLORURINE YELLOW 05/27/2017 0714   APPEARANCEUR CLEAR 05/27/2017 0714   LABSPEC 1.023 05/27/2017 0714   PHURINE 5.0 05/27/2017 0714   GLUCOSEU >=500 (A) 05/27/2017 0714   HGBUR NEGATIVE 05/27/2017 0714   BILIRUBINUR NEGATIVE 05/27/2017 0714   KETONESUR NEGATIVE 05/27/2017 0714   PROTEINUR 30 (A) 05/27/2017 0714   NITRITE NEGATIVE 05/27/2017 0714   LEUKOCYTESUR NEGATIVE 05/27/2017 0714   Sepsis Labs: @LABRCNTIP (procalcitonin:4,lacticidven:4)  )No results found for this or any previous visit (from the past 240 hour(s)).       Radiology Studies: Dg Shoulder Right  Result Date: 05/26/2017 CLINICAL DATA:   Fall.  Right shoulder pain.  Initial encounter. EXAM: RIGHT SHOULDER - 2+ VIEW COMPARISON:  None. FINDINGS: There is no evidence of acute fracture or dislocation. Generalized osteopenia noted. Soft tissues are unremarkable. IMPRESSION: No acute findings. Electronically Signed   By: Earle Gell M.D.   On: 05/26/2017 23:10   Ct Head Wo Contrast  Result Date: 05/26/2017 CLINICAL DATA:  Sneezed then fell to the ground, no loss of consciousness, possible small skull deformity of back of head, RIGHT hip deformity, initial encounter EXAM: CT HEAD WITHOUT CONTRAST CT CERVICAL SPINE WITHOUT CONTRAST TECHNIQUE: Multidetector CT imaging of the head and cervical spine was performed following the standard protocol without intravenous contrast. Multiplanar CT image reconstructions of the cervical spine were also generated. COMPARISON:  None FINDINGS: CT HEAD FINDINGS Brain: Mild generalized atrophy. Normal ventricular morphology. No midline shift or mass effect. Mild small vessel chronic ischemic changes of periventricular white matter in the frontal lobes. No intracranial hemorrhage, mass lesion, or evidence of acute infarction. No extra-axial fluid collections. Vascular: Extensive  atherosclerotic calcification of internal carotid arteries at skull base Skull: Calvaria intact.  RIGHT parietooccipital scalp hematoma. Sinuses/Orbits: Visualized paranasal sinuses and mastoid air cells clear Other: N/A CT CERVICAL SPINE FINDINGS Alignment: Normal Skull base and vertebrae: Osseous demineralization. Visualized skull base intact. Vertebral body heights maintained without fracture or bone destruction. Multilevel disc space narrowing and endplate spur formation. Multilevel facet degenerative changes. Encroachment upon RIGHT cervical neural foramina at C4-C5 by facet hypertrophy. Soft tissues and spinal canal: Prevertebral soft tissues normal thickness. Remaining visualized cervical soft tissues unremarkable. Disc levels:  No  additional abnormalities Upper chest: Emphysematous changes at lung apices. 4 mm RIGHT upper lobe nodule image 80. Minimal scattered interseptal thickening. Other: Atherosclerotic calcifications of the carotid bifurcations, proximal great vessels and aortic arch. IMPRESSION: Atrophy with mild small vessel chronic ischemic changes of deep cerebral white matter. No acute intracranial abnormalities. Degenerative disc and facet disease changes of the cervical spine. No acute cervical spine abnormalities. 4 mm RIGHT upper lobe pulmonary nodule, recommendation below. No follow-up needed if patient is low-risk. Non-contrast chest CT can be considered in 12 months if patient is high-risk. This recommendation follows the consensus statement: Guidelines for Management of Incidental Pulmonary Nodules Detected on CT Images: From the Fleischner Society 2017; Radiology 2017; 284:228-243. Electronically Signed   By: Lavonia Dana M.D.   On: 05/26/2017 20:22   Ct Cervical Spine Wo Contrast  Result Date: 05/26/2017 CLINICAL DATA:  Sneezed then fell to the ground, no loss of consciousness, possible small skull deformity of back of head, RIGHT hip deformity, initial encounter EXAM: CT HEAD WITHOUT CONTRAST CT CERVICAL SPINE WITHOUT CONTRAST TECHNIQUE: Multidetector CT imaging of the head and cervical spine was performed following the standard protocol without intravenous contrast. Multiplanar CT image reconstructions of the cervical spine were also generated. COMPARISON:  None FINDINGS: CT HEAD FINDINGS Brain: Mild generalized atrophy. Normal ventricular morphology. No midline shift or mass effect. Mild small vessel chronic ischemic changes of periventricular white matter in the frontal lobes. No intracranial hemorrhage, mass lesion, or evidence of acute infarction. No extra-axial fluid collections. Vascular: Extensive atherosclerotic calcification of internal carotid arteries at skull base Skull: Calvaria intact.  RIGHT  parietooccipital scalp hematoma. Sinuses/Orbits: Visualized paranasal sinuses and mastoid air cells clear Other: N/A CT CERVICAL SPINE FINDINGS Alignment: Normal Skull base and vertebrae: Osseous demineralization. Visualized skull base intact. Vertebral body heights maintained without fracture or bone destruction. Multilevel disc space narrowing and endplate spur formation. Multilevel facet degenerative changes. Encroachment upon RIGHT cervical neural foramina at C4-C5 by facet hypertrophy. Soft tissues and spinal canal: Prevertebral soft tissues normal thickness. Remaining visualized cervical soft tissues unremarkable. Disc levels:  No additional abnormalities Upper chest: Emphysematous changes at lung apices. 4 mm RIGHT upper lobe nodule image 80. Minimal scattered interseptal thickening. Other: Atherosclerotic calcifications of the carotid bifurcations, proximal great vessels and aortic arch. IMPRESSION: Atrophy with mild small vessel chronic ischemic changes of deep cerebral white matter. No acute intracranial abnormalities. Degenerative disc and facet disease changes of the cervical spine. No acute cervical spine abnormalities. 4 mm RIGHT upper lobe pulmonary nodule, recommendation below. No follow-up needed if patient is low-risk. Non-contrast chest CT can be considered in 12 months if patient is high-risk. This recommendation follows the consensus statement: Guidelines for Management of Incidental Pulmonary Nodules Detected on CT Images: From the Fleischner Society 2017; Radiology 2017; 284:228-243. Electronically Signed   By: Lavonia Dana M.D.   On: 05/26/2017 20:22   Ct Abdomen Pelvis W Contrast  Result  Date: 05/27/2017 CLINICAL DATA:  Abdominal distension EXAM: CT ABDOMEN AND PELVIS WITH CONTRAST TECHNIQUE: Multidetector CT imaging of the abdomen and pelvis was performed using the standard protocol following bolus administration of intravenous contrast. CONTRAST:  80 mL ISOVUE-300 IOPAMIDOL (ISOVUE-300)  INJECTION 61% COMPARISON:  11/27/2016, 11/01/2016, 12/08/2013 FINDINGS: Lower chest: Patchy dependent atelectasis. Cardiomegaly with coronary calcification. Hepatobiliary: No focal hepatic abnormality. Stable mild intra and extrahepatic biliary enlargement status post cholecystectomy Pancreas: Unremarkable. No pancreatic ductal dilatation or surrounding inflammatory changes. Spleen: Normal in size without focal abnormality. Adrenals/Urinary Tract: Adrenal glands are within normal limits. No hydronephrosis. Multiple cysts in the kidneys with additional subcentimeter hypodense lesions too small to characterize. The bladder is normal Stomach/Bowel: The stomach is nonenlarged. No dilated small bowel. No colon wall thickening. Sigmoid colon diverticular disease without acute inflammation. Again noted is asymmetric masslike wall thickening of the rectum, series 3, image number 84. normal appendix Vascular/Lymphatic: Moderate aortic atherosclerosis. No aneurysmal dilatation. No significantly enlarged lymph nodes. Reproductive: Status post hysterectomy. No adnexal masses. Other: Negative for free air or significant free fluid. Musculoskeletal: Status post right hip replacement with artifact. Degenerative changes of the spine with chronic superior endplate deformity at L4. Stable grade 1 anterolisthesis of L4 on L5. IMPRESSION: 1. Negative for bowel obstruction or acute abnormality 2. Stable asymmetric wall thickening of the left rectum suspicious for mass lesion, correlation with direct visualization previously recommended 3. Multiple hypodense renal lesions, probably cysts 4. Sigmoid colon diverticular disease without acute inflammation Electronically Signed   By: Donavan Foil M.D.   On: 05/27/2017 00:51   Dg C-arm 1-60 Min  Result Date: 05/27/2017 CLINICAL DATA:  Female with a history of fracture EXAM: OPERATIVE RIGHT HIP (WITH PELVIS IF PERFORMED)  VIEWS TECHNIQUE: Fluoroscopic spot image(s) were submitted for  interpretation post-operatively. COMPARISON:  05/26/2017 FINDINGS: Limited intraoperative fluoroscopic spot images of open reduction internal fixation of perihardware fracture. Images demonstrate changes of buttress plate and screw fixation with cerclage wires for repair of perihardware fracture of the proximal right femur. No immediate complicating features. Surgical changes of right hip arthroplasty. IMPRESSION: Intraoperative fluoroscopic spot images demonstrating ORIF for repair of perihardware fracture at right femur, as above. Please refer to the dictated operative report for full details of intraoperative findings and procedure. Electronically Signed   By: Corrie Mckusick D.O.   On: 05/27/2017 11:12   Dg Hip Port Unilat With Pelvis 1v Right  Result Date: 05/26/2017 CLINICAL DATA:  Fall, right hip pain EXAM: DG HIP (WITH OR WITHOUT PELVIS) 1V PORT RIGHT COMPARISON:  None. FINDINGS: No pelvic diastasis. Right hip hemiarthroplasty. Acute oblique periprosthetic fracture in the proximal right femur with 1 shaft's with medial displacement of the distal fracture fragment and with overriding of the fracture fragments. No evidence of dislocation at the hip joints on these frontal views. No suspicious focal osseous lesions. Degenerative changes in the visualized lower lumbar spine. IMPRESSION: Right hip hemiarthroplasty. Acute periprosthetic fracture in the proximal right femur with displacement and overriding as detailed. No evidence of hip dislocation on these frontal views. Electronically Signed   By: Ilona Sorrel M.D.   On: 05/26/2017 20:03   Dg Hip Operative Unilat W Or W/o Pelvis Right  Result Date: 05/27/2017 CLINICAL DATA:  Female with a history of fracture EXAM: OPERATIVE RIGHT HIP (WITH PELVIS IF PERFORMED)  VIEWS TECHNIQUE: Fluoroscopic spot image(s) were submitted for interpretation post-operatively. COMPARISON:  05/26/2017 FINDINGS: Limited intraoperative fluoroscopic spot images of open reduction  internal fixation of perihardware fracture. Images  demonstrate changes of buttress plate and screw fixation with cerclage wires for repair of perihardware fracture of the proximal right femur. No immediate complicating features. Surgical changes of right hip arthroplasty. IMPRESSION: Intraoperative fluoroscopic spot images demonstrating ORIF for repair of perihardware fracture at right femur, as above. Please refer to the dictated operative report for full details of intraoperative findings and procedure. Electronically Signed   By: Corrie Mckusick D.O.   On: 05/27/2017 11:12   Dg Femur, Min 2 Views Right  Result Date: 05/27/2017 CLINICAL DATA:  Femur fracture repair.  Initial encounter. EXAM: RIGHT FEMUR 2 VIEWS COMPARISON:  Yesterday FINDINGS: Right hip hemiarthroplasty with periprosthetic fracture about the femoral stem yesterday. There is been placement of a lateral plate and screw with cerclage wires. Fracture alignment is anatomic. The hip is located. No unexpected finding. IMPRESSION: ORIF of right femur periprosthetic fracture.  No unexpected finding. Electronically Signed   By: Monte Fantasia M.D.   On: 05/27/2017 13:29        Scheduled Meds: . atorvastatin  40 mg Oral Daily  . azithromycin  250 mg Oral Daily  . bacitracin   Topical BID  . fenofibrate  160 mg Oral Daily  . insulin aspart  0-5 Units Subcutaneous QHS  . insulin aspart  0-9 Units Subcutaneous TID WC  . loratadine  10 mg Oral Daily  . losartan  100 mg Oral Daily  . metoprolol succinate  50 mg Oral Daily  . sertraline  50 mg Oral Daily  . vitamin C  500 mg Oral BID   Continuous Infusions: . sodium chloride 75 mL/hr at 05/28/17 1606  . methocarbamol (ROBAXIN)  IV Stopped (05/27/17 1201)     LOS: 2 days    Time spent: [place]    Dana Allan, MD  [facility] Pager #: [phone] 7PM-7AM contact night coverage as above

## 2017-05-28 NOTE — Evaluation (Addendum)
Physical Therapy Evaluation Patient Details Name: Maureen Morales MRN: [id number] DOB: [birth year] Today's Date: 05/28/2017   History of Present Illness  Pt is a 84 y/o F past medical history significant for hypertension, hyperlipidemia, depression, CAD (had heart attack 02/11/17) and history of a right hip hemiarthroplasty performed in [place] in October of this past year.  Patient was admitted following a mechanical fall, with associated right hip pain.  X-ray revealed acute periprosthetic fracture involving the proximal right femur with displacement. Pt is now s/p ORIF periprosthetic fracture   Clinical Impression  Pt admitted with above diagnosis. Pt currently with functional limitations due to the deficits listed below (see PT Problem List). At the time of PT eval pt was able to perform transfers with gross +2 mod assist for transfers, and +2 max assist for SPT to chair. Overall, pt held RLE up in the air and maintained NWB status well. Feel this patient would be a great candidate for CIR level rehab to maximize independence with functional transfers to/from wheelchair and facilitate d/c home with husband. Pt will also have the support of aides upon return home for assist with mobility and ADL's. Acutely, pt will benefit from skilled PT to increase their independence and safety with mobility to allow discharge to the venue listed below.       Follow Up Recommendations CIR;Supervision/Assistance - 24 hour    Equipment Recommendations  None recommended by PT    Recommendations for Other Services Rehab consult     Precautions / Restrictions Precautions Precautions: Fall Restrictions Weight Bearing Restrictions: Yes RLE Weight Bearing: Non weight bearing      Mobility  Bed Mobility Overal bed mobility: Needs Assistance Bed Mobility: Supine to Sit     Supine to sit: Mod assist;+2 for physical assistance;HOB elevated     General bed mobility comments: VC's for use of rails for  support. Assist for LE advancement towards EOB and scooting assist provided with bed pad. Pt was able to scoot herself out of EOB once in an upright seated position.   Transfers Overall transfer level: Needs assistance Equipment used: Rolling walker (2 wheeled) Transfers: Sit to/from [facility] Sit to Stand: Mod assist;+2 physical assistance Stand pivot transfers: Max assist;+2 physical assistance       General transfer comment: VC's for maintenance of NWB status on the RLE. Noted pt holding RLE up in the air throughout entire transfer. Pt was able to advance L foot laterally using a heel scoot and then toe scoot method, but once turned, pt unable to gain enough leverage with UE's to hop backwards towards chair. Max assist provided overall for transition safely to chair.   Ambulation/Gait             General Gait Details: Unable  Stairs            Wheelchair Mobility    Modified Rankin (Stroke Patients Only)       Balance Overall balance assessment: Needs assistance Sitting-balance support: Feet supported;No upper extremity supported Sitting balance-Leahy Scale: Fair     Standing balance support: Bilateral upper extremity supported;During functional activity Standing balance-Leahy Scale: Poor Standing balance comment: Reliant on UE support on walker.                              Pertinent Vitals/Pain Pain Assessment: Faces Faces Pain Scale: Hurts even more Pain Location: RLE Pain Descriptors / Indicators: Operative site guarding;Discomfort;Grimacing Pain Intervention(s): Limited  activity within patient's tolerance;Monitored during session;Repositioned    Home Living Family/patient expects to be discharged to:: Private residence Living Arrangements: Spouse/significant other Available Help at Discharge: Family;Personal care attendant           Home Equipment: Gilford Rile - 2 wheels;Toilet riser      Prior Function Level of  Independence: Needs assistance   Gait / Transfers Assistance Needed: using RW for ambulation  ADL's / Homemaking Assistance Needed: Pt receiving assist from personal care aide who were supervising/assisting with bathing and LB dressing        Hand Dominance        Extremity/Trunk Assessment   Upper Extremity Assessment Upper Extremity Assessment: Generalized weakness    Lower Extremity Assessment Lower Extremity Assessment: Defer to PT evaluation RLE Deficits / Details: Decreased strength and AROM consistent with above mentioned procedure.     Cervical / Trunk Assessment Cervical / Trunk Assessment: Kyphotic  Communication   Communication: HOH  Cognition Arousal/Alertness: Awake/alert(sleepy but awake and participating during session) Behavior During Therapy: WFL for tasks assessed/performed Overall Cognitive Status: Impaired/Different from baseline Area of Impairment: Attention;Memory;Following commands;Safety/judgement;Awareness;Problem solving                   Current Attention Level: Sustained Memory: Decreased short-term memory;Decreased recall of precautions Following Commands: Follows one step commands consistently;Follows one step commands with increased time;Follows multi-step commands inconsistently Safety/Judgement: Decreased awareness of safety;Decreased awareness of deficits Awareness: Intellectual Problem Solving: Slow processing;Decreased initiation;Difficulty sequencing;Requires verbal cues General Comments: Pt perseverating on whether daughter slept well overnight. Noted decreased recall of WB precautions when asked verbally however maintained NWB status well. At end of session pt asking whether she is going home today.       General Comments      Exercises     Assessment/Plan    PT Assessment Patient needs continued PT services  PT Problem List Decreased strength;Decreased activity tolerance;Decreased range of motion;Decreased  balance;Decreased mobility;Decreased knowledge of use of DME;Decreased safety awareness;Decreased knowledge of precautions;Pain       PT Treatment Interventions DME instruction;Gait training;Stair training;Functional mobility training;Therapeutic activities;Therapeutic exercise;Neuromuscular re-education;Patient/family education    PT Goals (Current goals can be found in the Care Plan section)  Acute Rehab PT Goals Patient Stated Goal: Pt did not state goals. Daughter's goal is for pt to d/c to CIR and then return home with husband and aide support when able PT Goal Formulation: With patient/family Time For Goal Achievement: 06/11/17 Potential to Achieve Goals: Good    Frequency Min 3X/week   Barriers to discharge        Co-evaluation PT/OT/SLP Co-Evaluation/Treatment: Yes Reason for Co-Treatment: Necessary to address cognition/behavior during functional activity;For patient/therapist safety;To address functional/ADL transfers PT goals addressed during session: Mobility/safety with mobility;Balance;Proper use of DME         AM-PAC PT "6 Clicks" Daily Activity  Outcome Measure Difficulty turning over in bed (including adjusting bedclothes, sheets and blankets)?: Unable Difficulty moving from lying on back to sitting on the side of the bed? : Unable Difficulty sitting down on and standing up from a chair with arms (e.g., wheelchair, bedside commode, etc,.)?: Unable Help needed moving to and from a bed to chair (including a wheelchair)?: Total Help needed walking in hospital room?: Total Help needed climbing 3-5 steps with a railing? : Total 6 Click Score: 6    End of Session Equipment Utilized During Treatment: Gait belt Activity Tolerance: Patient limited by fatigue Patient left: in chair;with call bell/phone within reach;with  chair alarm set;with family/visitor present Nurse Communication: Mobility status PT Visit Diagnosis: Unsteadiness on feet (R26.81);Pain;Other  abnormalities of gait and mobility (R26.89);Repeated falls (R29.6) Pain - Right/Left: Right Pain - part of body: Leg;Hip    Time: 8682-5749 PT Time Calculation (min) (ACUTE ONLY): 33 min   Charges:   PT Evaluation $PT Eval Moderate Complexity: 1 Mod     PT G Codes:        Rolinda Roan, PT, DPT Acute Rehabilitation Services Pager: [phone]   Thelma Comp 05/28/2017, 9:45 AM

## 2017-05-28 NOTE — Progress Notes (Signed)
  Echocardiogram 2D Echocardiogram has been performed.  Maureen Morales 05/28/2017, 11:15 AM

## 2017-05-28 NOTE — Consult Note (Signed)
Physical Medicine and Rehabilitation Consult   Reason for Consult: functional deficits due to right femur fracture  Referring Physician: Dr. Marthenia Rolling.   HPI: Maureen Morales is a 84 y.o. female with history of CAD, HTN, right hip hemiarthroplasty (still has hip precautions);  who was admitted on 05/26/17 after fall with periprosthetic femur fracture and scalp hematoma.  CT abdomen done in ED due to distension and questioned left rectal mass.  She was taken to OR for ORIF right femoral shaft with plate and screw. ABLA treated with transfusion. To be NWB RLE for weeks to months per Dr. Ninfa Linden. Therapy evaluations done today revealing functional deficits and CIR recommended for follow up therapy.   Was independent and driving prior to fracture in Oct 2018. Has been using a walker with supervision for mobility.  Has had an aide for help prn for past 35 years. Still reasonably active for her age--was helping with cooking when she fell. She had 4 weeks rehab at [facility] last year.   Review of Systems  HENT: Positive for hearing loss (left ear). Negative for tinnitus.   Eyes: Negative for blurred vision and double vision.  Respiratory: Negative for shortness of breath.   Cardiovascular: Negative for chest pain and palpitations.  Gastrointestinal: Negative for heartburn and nausea.  Genitourinary: Positive for frequency.  Musculoskeletal: Negative for neck pain.  Skin: Negative for rash.  Neurological: Positive for weakness.  Psychiatric/Behavioral: Negative for memory loss. The patient does not have insomnia.   All other systems reviewed and are negative.     Past Medical History:  Diagnosis Date  . CAD (coronary artery disease)   . Depression   . Essential hypertension   . HLD (hyperlipidemia)     Past Surgical History:  Procedure Laterality Date  . TOTAL HIP ARTHROPLASTY Right   . uterian prolapse      Family History  Problem Relation Age of Onset  . Breast cancer  Mother   . Colon cancer Father   . Stroke Sister     Social History:   Married.  Lives at home with husband who is 100. They have each have an aide. She was independent with walker and supervision. Needed  She r ADLsreports that  has never smoked. She does not have any smokeless tobacco history on file. She reports that she does not drink alcohol or use drugs.    Allergies  Allergen Reactions  . Asa [Aspirin] Anaphylaxis  . Darvon [Propoxyphene] Nausea And Vomiting  . Ferrous Sulfate Diarrhea  . Meperidine And Related   . Penicillins Other (See Comments)    Pt and family unsure of details    Medications Prior to Admission  Medication Sig Dispense Refill  . atorvastatin (LIPITOR) 40 MG tablet Take 40 mg by mouth daily.    . fenofibrate 160 MG tablet Take 160 mg by mouth daily.    [name] loratadine (CLARITIN) 10 MG tablet Take 10 mg by mouth daily.    [name] losartan (COZAAR) 100 MG tablet Take 100 mg by mouth daily.    . metoprolol succinate (TOPROL-XL) 50 MG 24 hr tablet Take 50 mg by mouth daily. Take with or immediately following a meal.    . sertraline (ZOLOFT) 50 MG tablet Take 50 mg by mouth daily.    . vitamin C (ASCORBIC ACID) 500 MG tablet Take 500 mg by mouth 2 (two) times daily.      Home: Home Living Family/patient expects to be discharged to::  Private residence Living Arrangements: Spouse/significant other Available Help at Discharge: Family, [place]: Environmental consultant - 2 wheels, Toilet riser  Functional History: Prior Function Level of Independence: Needs assistance Gait / Transfers Assistance Needed: using RW for ambulation ADL's / Homemaking Assistance Needed: Pt receiving assist from personal care aide who were supervising/assisting with bathing and LB dressing Functional Status:  Mobility: Bed Mobility Overal bed mobility: Needs Assistance Bed Mobility: Supine to Sit Supine to sit: Mod assist, +2 for physical assistance, HOB elevated General  bed mobility comments: VC's for use of rails for support. Assist for LE advancement towards EOB and scooting assist provided with bed pad. Pt was able to scoot herself out of EOB once in an upright seated position.  Transfers Overall transfer level: Needs assistance Equipment used: Rolling walker (2 wheeled) Transfers: Sit to/from Stand, [employer] Transfers Sit to Stand: Mod assist, +2 physical assistance Stand pivot transfers: Max assist, +2 physical assistance General transfer comment: VC's for maintenance of NWB status on the RLE. Noted pt holding RLE up in the air throughout entire transfer. Pt was able to advance L foot laterally using a heel scoot and then toe scoot method, but once turned, pt unable to gain enough leverage with UE's to hop backwards towards chair. Max assist provided overall for transition safely to chair.  Ambulation/Gait General Gait Details: Unable    ADL: ADL Overall ADL's : Needs assistance/impaired Eating/Feeding: Set up, Sitting Grooming: Set up, Sitting, Wash/dry face Upper Body Bathing: Minimal assistance, Sitting Lower Body Bathing: Moderate assistance, +2 for physical assistance, +2 for safety/equipment, Sit to/from stand Upper Body Dressing : Minimal assistance, Sitting Lower Body Dressing: +2 for physical assistance, +2 for safety/equipment, Maximal assistance, Sit to/from stand Toilet Transfer: +2 for safety/equipment, +2 for physical assistance, Stand-pivot, BSC, RW, Maximal assistance Toilet Transfer Details (indicate cue type and reason): simulated in transfer EOB>recliner  Toileting- Clothing Manipulation and Hygiene: Maximal assistance, +2 for physical assistance, +2 for safety/equipment, Sit to/from stand Functional mobility during ADLs: Maximal assistance, +2 for physical assistance, +2 for safety/equipment, Rolling walker  Cognition: Cognition Overall Cognitive Status: Impaired/Different from baseline Orientation Level: (uta r/t  drowsiness) Cognition Arousal/Alertness: Awake/alert(sleepy but awake and participating during session) Behavior During Therapy: WFL for tasks assessed/performed Overall Cognitive Status: Impaired/Different from baseline Area of Impairment: Attention, Memory, Following commands, Safety/judgement, Awareness, Problem solving Current Attention Level: Sustained Memory: Decreased short-term memory, Decreased recall of precautions Following Commands: Follows one step commands consistently, Follows one step commands with increased time, Follows multi-step commands inconsistently Safety/Judgement: Decreased awareness of safety, Decreased awareness of deficits Awareness: Intellectual Problem Solving: Slow processing, Decreased initiation, Difficulty sequencing, Requires verbal cues General Comments: Pt perseverating on whether daughter slept well overnight. Noted decreased recall of WB precautions when asked verbally however maintained NWB status well. At end of session pt asking whether she is going home today.   Blood pressure (!) 133/45, pulse 81, temperature 98.6 F (37 C), temperature source Oral, resp. rate 16, SpO2 99 %. Physical Exam  Nursing note and vitals reviewed. Constitutional: She is oriented to person, place, and time. She appears well-developed and well-nourished.  Frail appearing elderly female.   HENT:  Head: Normocephalic.  Mouth/Throat: Mucous membranes are dry.  Eyes: Conjunctivae and EOM are normal. Pupils are equal, round, and reactive to light.  Neck: Normal range of motion. Neck supple.  Cardiovascular: Normal rate and regular rhythm.  Murmur heard. Respiratory: Effort normal and breath sounds normal. No stridor. No respiratory distress. She  has no wheezes.  GI: Soft. Bowel sounds are normal. She exhibits distension. There is no tenderness. There is no guarding.  Musculoskeletal: She exhibits edema and tenderness.  RLE limited due to pain inhibition.  Surgical dressing  on right hip.   Neurological: She is alert and oriented to person, place, and time.  HOH in left ear.  Slow speech.  Was not able to recall name of hospital  but able to answer orientation questions.  She was able to follow simple commands motor without difficulty.  Motor: B/l UE 4-/5 proximal to distal RLE: HF, KE 1+/5, ADF/PF 3/5 (pain inhibition) LLE: HF, KE 2+/5, ADF/PF 3+/5   Skin: Skin is warm and dry.  Psychiatric: Her affect is blunt. She is slowed. She does not express impulsivity.    Results for orders placed or performed during the hospital encounter of 05/26/17 (from the past 24 hour(s))  Glucose, capillary     Status: Abnormal   Collection Time: 05/27/17  1:11 PM  Result Value Ref Range   Glucose-Capillary 238 (H) 65 - 99 mg/dL  Glucose, capillary     Status: Abnormal   Collection Time: 05/27/17  4:30 PM  Result Value Ref Range   Glucose-Capillary 222 (H) 65 - 99 mg/dL  Glucose, capillary     Status: Abnormal   Collection Time: 05/27/17  8:33 PM  Result Value Ref Range   Glucose-Capillary 175 (H) 65 - 99 mg/dL  Glucose, capillary     Status: Abnormal   Collection Time: 05/28/17  7:15 AM  Result Value Ref Range   Glucose-Capillary 188 (H) 65 - 99 mg/dL  CBC     Status: Abnormal   Collection Time: 05/28/17  7:24 AM  Result Value Ref Range   WBC 7.7 4.0 - 10.5 K/uL   RBC 2.67 (L) 3.87 - 5.11 MIL/uL   Hemoglobin 7.7 (L) 12.0 - 15.0 g/dL   HCT 22.9 (L) 36.0 - 46.0 %   MCV 85.8 78.0 - 100.0 fL   MCH 28.8 26.0 - 34.0 pg   MCHC 33.6 30.0 - 36.0 g/dL   RDW 16.0 (H) 11.5 - 15.5 %   Platelets 174 150 - 400 K/uL  Basic metabolic panel     Status: Abnormal   Collection Time: 05/28/17  7:24 AM  Result Value Ref Range   Sodium 138 135 - 145 mmol/L   Potassium 3.6 3.5 - 5.1 mmol/L   Chloride 110 101 - 111 mmol/L   CO2 21 (L) 22 - 32 mmol/L   Glucose, Bld 193 (H) 65 - 99 mg/dL   BUN 32 (H) 6 - 20 mg/dL   Creatinine, Ser 1.31 (H) 0.44 - 1.00 mg/dL   Calcium 8.3 (L) 8.9  - 10.3 mg/dL   GFR calc non Af Amer 34 (L) >60 mL/min   GFR calc Af Amer 40 (L) >60 mL/min   Anion gap 7 5 - 15  Hemoglobin A1c     Status: Abnormal   Collection Time: 05/28/17  7:24 AM  Result Value Ref Range   Hgb A1c MFr Bld 7.0 (H) 4.8 - 5.6 %   Mean Plasma Glucose 154.2 mg/dL  Glucose, capillary     Status: Abnormal   Collection Time: 05/28/17 12:05 PM  Result Value Ref Range   Glucose-Capillary 249 (H) 65 - 99 mg/dL   Dg Shoulder Right  Result Date: 05/26/2017 CLINICAL DATA:  Fall.  Right shoulder pain.  Initial encounter. EXAM: RIGHT SHOULDER - 2+ VIEW COMPARISON:  None. FINDINGS:  There is no evidence of acute fracture or dislocation. Generalized osteopenia noted. Soft tissues are unremarkable. IMPRESSION: No acute findings. Electronically Signed   By: Earle Gell M.D.   On: 05/26/2017 23:10   Ct Head Wo Contrast  Result Date: 05/26/2017 CLINICAL DATA:  Sneezed then fell to the ground, no loss of consciousness, possible small skull deformity of back of head, RIGHT hip deformity, initial encounter EXAM: CT HEAD WITHOUT CONTRAST CT CERVICAL SPINE WITHOUT CONTRAST TECHNIQUE: Multidetector CT imaging of the head and cervical spine was performed following the standard protocol without intravenous contrast. Multiplanar CT image reconstructions of the cervical spine were also generated. COMPARISON:  None FINDINGS: CT HEAD FINDINGS Brain: Mild generalized atrophy. Normal ventricular morphology. No midline shift or mass effect. Mild small vessel chronic ischemic changes of periventricular white matter in the frontal lobes. No intracranial hemorrhage, mass lesion, or evidence of acute infarction. No extra-axial fluid collections. Vascular: Extensive atherosclerotic calcification of internal carotid arteries at skull base Skull: Calvaria intact.  RIGHT parietooccipital scalp hematoma. Sinuses/Orbits: Visualized paranasal sinuses and mastoid air cells clear Other: N/A CT CERVICAL SPINE FINDINGS  Alignment: Normal Skull base and vertebrae: Osseous demineralization. Visualized skull base intact. Vertebral body heights maintained without fracture or bone destruction. Multilevel disc space narrowing and endplate spur formation. Multilevel facet degenerative changes. Encroachment upon RIGHT cervical neural foramina at C4-C5 by facet hypertrophy. Soft tissues and spinal canal: Prevertebral soft tissues normal thickness. Remaining visualized cervical soft tissues unremarkable. Disc levels:  No additional abnormalities Upper chest: Emphysematous changes at lung apices. 4 mm RIGHT upper lobe nodule image 80. Minimal scattered interseptal thickening. Other: Atherosclerotic calcifications of the carotid bifurcations, proximal great vessels and aortic arch. IMPRESSION: Atrophy with mild small vessel chronic ischemic changes of deep cerebral white matter. No acute intracranial abnormalities. Degenerative disc and facet disease changes of the cervical spine. No acute cervical spine abnormalities. 4 mm RIGHT upper lobe pulmonary nodule, recommendation below. No follow-up needed if patient is low-risk. Non-contrast chest CT can be considered in 12 months if patient is high-risk. This recommendation follows the consensus statement: Guidelines for Management of Incidental Pulmonary Nodules Detected on CT Images: From the Fleischner Society 2017; Radiology 2017; 284:228-243. Electronically Signed   By: Lavonia Dana M.D.   On: 05/26/2017 20:22   Ct Cervical Spine Wo Contrast  Result Date: 05/26/2017 CLINICAL DATA:  Sneezed then fell to the ground, no loss of consciousness, possible small skull deformity of back of head, RIGHT hip deformity, initial encounter EXAM: CT HEAD WITHOUT CONTRAST CT CERVICAL SPINE WITHOUT CONTRAST TECHNIQUE: Multidetector CT imaging of the head and cervical spine was performed following the standard protocol without intravenous contrast. Multiplanar CT image reconstructions of the cervical spine  were also generated. COMPARISON:  None FINDINGS: CT HEAD FINDINGS Brain: Mild generalized atrophy. Normal ventricular morphology. No midline shift or mass effect. Mild small vessel chronic ischemic changes of periventricular white matter in the frontal lobes. No intracranial hemorrhage, mass lesion, or evidence of acute infarction. No extra-axial fluid collections. Vascular: Extensive atherosclerotic calcification of internal carotid arteries at skull base Skull: Calvaria intact.  RIGHT parietooccipital scalp hematoma. Sinuses/Orbits: Visualized paranasal sinuses and mastoid air cells clear Other: N/A CT CERVICAL SPINE FINDINGS Alignment: Normal Skull base and vertebrae: Osseous demineralization. Visualized skull base intact. Vertebral body heights maintained without fracture or bone destruction. Multilevel disc space narrowing and endplate spur formation. Multilevel facet degenerative changes. Encroachment upon RIGHT cervical neural foramina at C4-C5 by facet hypertrophy. Soft tissues and spinal  canal: Prevertebral soft tissues normal thickness. Remaining visualized cervical soft tissues unremarkable. Disc levels:  No additional abnormalities Upper chest: Emphysematous changes at lung apices. 4 mm RIGHT upper lobe nodule image 80. Minimal scattered interseptal thickening. Other: Atherosclerotic calcifications of the carotid bifurcations, proximal great vessels and aortic arch. IMPRESSION: Atrophy with mild small vessel chronic ischemic changes of deep cerebral white matter. No acute intracranial abnormalities. Degenerative disc and facet disease changes of the cervical spine. No acute cervical spine abnormalities. 4 mm RIGHT upper lobe pulmonary nodule, recommendation below. No follow-up needed if patient is low-risk. Non-contrast chest CT can be considered in 12 months if patient is high-risk. This recommendation follows the consensus statement: Guidelines for Management of Incidental Pulmonary Nodules Detected on  CT Images: From the Fleischner Society 2017; Radiology 2017; 284:228-243. Electronically Signed   By: Lavonia Dana M.D.   On: 05/26/2017 20:22   Ct Abdomen Pelvis W Contrast  Result Date: 05/27/2017 CLINICAL DATA:  Abdominal distension EXAM: CT ABDOMEN AND PELVIS WITH CONTRAST TECHNIQUE: Multidetector CT imaging of the abdomen and pelvis was performed using the standard protocol following bolus administration of intravenous contrast. CONTRAST:  80 mL ISOVUE-300 IOPAMIDOL (ISOVUE-300) INJECTION 61% COMPARISON:  11/27/2016, 11/01/2016, 12/08/2013 FINDINGS: Lower chest: Patchy dependent atelectasis. Cardiomegaly with coronary calcification. Hepatobiliary: No focal hepatic abnormality. Stable mild intra and extrahepatic biliary enlargement status post cholecystectomy Pancreas: Unremarkable. No pancreatic ductal dilatation or surrounding inflammatory changes. Spleen: Normal in size without focal abnormality. Adrenals/Urinary Tract: Adrenal glands are within normal limits. No hydronephrosis. Multiple cysts in the kidneys with additional subcentimeter hypodense lesions too small to characterize. The bladder is normal Stomach/Bowel: The stomach is nonenlarged. No dilated small bowel. No colon wall thickening. Sigmoid colon diverticular disease without acute inflammation. Again noted is asymmetric masslike wall thickening of the rectum, series 3, image number 84. normal appendix Vascular/Lymphatic: Moderate aortic atherosclerosis. No aneurysmal dilatation. No significantly enlarged lymph nodes. Reproductive: Status post hysterectomy. No adnexal masses. Other: Negative for free air or significant free fluid. Musculoskeletal: Status post right hip replacement with artifact. Degenerative changes of the spine with chronic superior endplate deformity at L4. Stable grade 1 anterolisthesis of L4 on L5. IMPRESSION: 1. Negative for bowel obstruction or acute abnormality 2. Stable asymmetric wall thickening of the left rectum  suspicious for mass lesion, correlation with direct visualization previously recommended 3. Multiple hypodense renal lesions, probably cysts 4. Sigmoid colon diverticular disease without acute inflammation Electronically Signed   By: Donavan Foil M.D.   On: 05/27/2017 00:51   Dg C-arm 1-60 Min  Result Date: 05/27/2017 CLINICAL DATA:  Female with a history of fracture EXAM: OPERATIVE RIGHT HIP (WITH PELVIS IF PERFORMED)  VIEWS TECHNIQUE: Fluoroscopic spot image(s) were submitted for interpretation post-operatively. COMPARISON:  05/26/2017 FINDINGS: Limited intraoperative fluoroscopic spot images of open reduction internal fixation of perihardware fracture. Images demonstrate changes of buttress plate and screw fixation with cerclage wires for repair of perihardware fracture of the proximal right femur. No immediate complicating features. Surgical changes of right hip arthroplasty. IMPRESSION: Intraoperative fluoroscopic spot images demonstrating ORIF for repair of perihardware fracture at right femur, as above. Please refer to the dictated operative report for full details of intraoperative findings and procedure. Electronically Signed   By: Corrie Mckusick D.O.   On: 05/27/2017 11:12   Dg Hip Port Unilat With Pelvis 1v Right  Result Date: 05/26/2017 CLINICAL DATA:  Fall, right hip pain EXAM: DG HIP (WITH OR WITHOUT PELVIS) 1V PORT RIGHT COMPARISON:  None. FINDINGS: No  pelvic diastasis. Right hip hemiarthroplasty. Acute oblique periprosthetic fracture in the proximal right femur with 1 shaft's with medial displacement of the distal fracture fragment and with overriding of the fracture fragments. No evidence of dislocation at the hip joints on these frontal views. No suspicious focal osseous lesions. Degenerative changes in the visualized lower lumbar spine. IMPRESSION: Right hip hemiarthroplasty. Acute periprosthetic fracture in the proximal right femur with displacement and overriding as detailed. No evidence  of hip dislocation on these frontal views. Electronically Signed   By: Ilona Sorrel M.D.   On: 05/26/2017 20:03   Dg Hip Operative Unilat W Or W/o Pelvis Right  Result Date: 05/27/2017 CLINICAL DATA:  Female with a history of fracture EXAM: OPERATIVE RIGHT HIP (WITH PELVIS IF PERFORMED)  VIEWS TECHNIQUE: Fluoroscopic spot image(s) were submitted for interpretation post-operatively. COMPARISON:  05/26/2017 FINDINGS: Limited intraoperative fluoroscopic spot images of open reduction internal fixation of perihardware fracture. Images demonstrate changes of buttress plate and screw fixation with cerclage wires for repair of perihardware fracture of the proximal right femur. No immediate complicating features. Surgical changes of right hip arthroplasty. IMPRESSION: Intraoperative fluoroscopic spot images demonstrating ORIF for repair of perihardware fracture at right femur, as above. Please refer to the dictated operative report for full details of intraoperative findings and procedure. Electronically Signed   By: Corrie Mckusick D.O.   On: 05/27/2017 11:12   Dg Femur, Min 2 Views Right  Result Date: 05/27/2017 CLINICAL DATA:  Femur fracture repair.  Initial encounter. EXAM: RIGHT FEMUR 2 VIEWS COMPARISON:  Yesterday FINDINGS: Right hip hemiarthroplasty with periprosthetic fracture about the femoral stem yesterday. There is been placement of a lateral plate and screw with cerclage wires. Fracture alignment is anatomic. The hip is located. No unexpected finding. IMPRESSION: ORIF of right femur periprosthetic fracture.  No unexpected finding. Electronically Signed   By: Monte Fantasia M.D.   On: 05/27/2017 13:29    Assessment/Plan: Diagnosis: Right periprosthetic hip fracture Labs independently reviewed.  Records reviewed and summated above.  1. Does the need for close, 24 hr/day medical supervision in concert with the patient's rehab needs make it unreasonable for this patient to be served in a less intensive  setting? Yes  Co-Morbidities requiring supervision/potential complications: CAD (cont meds), HTN (monitor and provide prns in accordance with increased physical exertion and pain), right hip hemiarthroplasty (cont hip precautions), ABLA (transfuse if necessary to ensure appropriate perfusion for increased activity tolerance), DM (Monitor in accordance with exercise and adjust meds as necessary), AKI (avoid nephrotoxic meds), post-op pain (Biofeedback training with therapies to help reduce reliance on opiate pain medications, monitor pain control during therapies, and sedation at rest and titrate to maximum efficacy to ensure participation and gains in therapies) 2. Due to safety, skin/wound care, disease management, pain management and patient education, does the patient require 24 hr/day rehab nursing? Yes 3. Does the patient require coordinated care of a physician, rehab nurse, PT (1-2 hrs/day, 5 days/week) and OT (1-2 hrs/day, 5 days/week) to address physical and functional deficits in the context of the above medical diagnosis(es)? Yes Addressing deficits in the following areas: balance, endurance, locomotion, strength, transferring, bathing, dressing, toileting and psychosocial support 4. Can the patient actively participate in an intensive therapy program of at least 3 hrs of therapy per day at least 5 days per week? Potentially 5. The potential for patient to make measurable gains while on inpatient rehab is excellent 6. Anticipated functional outcomes upon discharge from inpatient rehab are mod assist  with PT, mod assist with OT, n/a with SLP. 7. Estimated rehab length of stay to reach the above functional goals is: 15-19 days. 8. Anticipated D/C setting: Home 9. Anticipated post D/C treatments: HH therapy and Home excercise program 10. Overall Rehab/Functional Prognosis: good and fair  RECOMMENDATIONS: This patient's condition is appropriate for continued rehabilitative care in the following  setting: CIR to decrease burden of care, however, pt prefers to go to a facility in [place].  Patient has agreed to participate in recommended program. Potentially Note that insurance prior authorization may be required for reimbursement for recommended care.  Comment: Rehab Admissions Coordinator to follow up.  Delice Lesch, MD, ABPMR Bary Leriche, PA-C 05/28/2017

## 2017-05-28 NOTE — Progress Notes (Signed)
Inpatient Rehabilitation  Per OT, PT request, patient was screened by Gunnar Fusi for appropriateness for an Inpatient Acute Rehab consult.  At this time we are recommending an Inpatient Rehab consult.  Text paged MD to notify; please order if you are agreeable.    Carmelia Roller., CCC/SLP Admission Coordinator  [facility]  Cell [phone]

## 2017-05-28 NOTE — Progress Notes (Signed)
Nutrition Brief Note  RD consulted for assessment. Pt presents after mechanical fall.  Pt is s/p ORIF periprosthetic fracture (01/27) Pt unavailable at time of visit. RD to follow-up.   Current diet order is carb modified, patient is consuming approximately 75% of meals at this time. Labs and medications reviewed.   Parks Ranger, MS, RDN, LDN 05/28/2017 1:26 PM

## 2017-05-28 NOTE — Progress Notes (Signed)
Inpatient Rehabilitation  Met with patient and sisters at bedside to discuss team's recommendation for IP Rehab.  Shared booklets and answered questions.  They reported that the patient's daughter was hopeful for an IP Rehab admission to reduce burden of care.  Plan to follow up with daughter tomorrow.  Will follow for timing of medical readiness and IP Rehab bed availability.  Call if questions.     , M.A., CCC/SLP Admission Coordinator  [facility]  Cell [phone]  

## 2017-05-28 NOTE — Progress Notes (Addendum)
Inpatient Diabetes Program Recommendations  AACE/ADA: New Consensus Statement on Inpatient Glycemic Control (2015)  Target Ranges:  Prepandial:   less than 140 mg/dL      Peak postprandial:   less than 180 mg/dL (1-2 hours)      Critically ill patients:  140 - 180 mg/dL  Results for Maureen Morales, Maureen Morales (MRN [id number]) as of 05/28/2017 11:53  Ref. Range 05/28/2017 07:24  Hemoglobin A1C Latest Ref Range: 4.8 - 5.6 % 7.0 (H)   Results for Maureen Morales, Maureen Morales (MRN [id number]) as of 05/28/2017 11:53  Ref. Range 05/27/2017 11:53 05/27/2017 13:11 05/27/2017 16:30 05/27/2017 20:33 05/28/2017 07:15  Glucose-Capillary Latest Ref Range: 65 - 99 mg/dL 185 (H) 238 (H) 222 (H) 175 (H) 188 (H)  Results for Maureen Morales, Maureen Morales (MRN [id number]) as of 05/28/2017 11:53  Ref. Range 05/26/2017 19:15 05/27/2017 10:22 05/28/2017 07:24  Glucose Latest Ref Range: 65 - 99 mg/dL 319 (H) 269 (H) 193 (H)   Review of Glycemic Control  Diabetes history: None noted on H&P but noted in merged chart pt has DM2 hx Outpatient Diabetes medications: None on current med list but noted Glipizide 2.5 mg BID in merged chart Current orders for Inpatient glycemic control: Novolog 0-9 units TID with meals, Novolog 0-5 units QHS  Inpatient Diabetes Program Recommendations:  Insulin-Correction: Please consider increasing Novolog correction to 0-15 units TID with meals. HgbA1C: A1C 7.0% on 05/28/17. However, noted hemoglobin 7.7 g/dL this morning on labs so A1C results are likely not completely accurate.   Thanks, Barnie Alderman, RN, MSN, CDE Diabetes Coordinator Inpatient Diabetes Program [phone] (Team Pager from 8am to 5pm)

## 2017-05-28 NOTE — NC FL2 (Signed)
[place] LEVEL OF CARE SCREENING TOOL     IDENTIFICATION  Patient Name: Maureen Morales Birthdate: 10-Nov-1922 Sex: female Admission Date (Current Location): 05/26/2017  [facility] and [place] Number:  Engineering geologist and Address:  [facility], [place] [address] [place], [place] [address]      Provider Number: [id number]  Attending Physician Name and Address:  Bonnell Public, MD  Relative Name and Phone Number:  Leoni Goodness, daughter, [phone]    Current Level of Care: Hospital Recommended Level of Care: [place] Prior Approval Number:    Date Approved/Denied:   PASRR Number: [id number]  Discharge Plan: SNF    Current Diagnoses: Patient Active Problem List   Diagnosis Date Noted  . Benign essential HTN   . Acute blood loss anemia   . Diabetes mellitus type 2 in nonobese (HCC)   . AKI (acute kidney injury) ([place])   . Post-operative pain   . Fall 05/26/2017  . Closed right hip fracture ([place]) 05/26/2017  . Normocytic anemia 05/26/2017  . Abdominal distention 05/26/2017  . CAD (coronary artery disease) 05/26/2017  . Depression   . HLD (hyperlipidemia)   . Essential hypertension   . Periprosthetic fracture around internal prosthetic hip joint, initial encounter     Orientation RESPIRATION BLADDER Height & Weight     Self  O2(Nasal  cannula) Continent Weight:   Height:     BEHAVIORAL SYMPTOMS/MOOD NEUROLOGICAL BOWEL NUTRITION STATUS      Continent Diet(See DC Summary)  AMBULATORY STATUS COMMUNICATION OF NEEDS Skin   Extensive Assist Verbally Surgical wounds                       Personal Care Assistance Level of Assistance  Dressing, Bathing, Feeding Bathing Assistance: Maximum assistance Feeding assistance: Limited assistance Dressing Assistance: Maximum assistance     Functional Limitations Info  Sight, Hearing, Speech Sight Info: Adequate Hearing Info: Adequate Speech Info: Adequate     SPECIAL CARE FACTORS FREQUENCY  PT (By licensed PT), OT (By licensed OT)     PT Frequency: 5x week OT Frequency: 5x week            Contractures Contractures Info: Not present    Additional Factors Info  Code Status, Allergies, Insulin Sliding Scale, Psychotropic Code Status Info: Full Code Allergies Info: ASA ASPIRIN, DARVON PROPOXYPHENE, FERROUS SULFATE, MEPERIDINE AND RELATED, PENICILLINS  Psychotropic Info: Zoloft Insulin Sliding Scale Info: ASA ASPIRIN, DARVON PROPOXYPHENE, FERROUS SULFATE, MEPERIDINE AND RELATED, PENICILLINS        Current Medications (05/28/2017):  This is the current hospital active medication list Current Facility-Administered Medications  Medication Dose Route Frequency Provider Last Rate Last Dose  . 0.9 %  sodium chloride infusion   Intravenous Continuous Virgel Manifold, MD 75 mL/hr at 05/28/17 1606    . acetaminophen (TYLENOL) tablet 650 mg  650 mg Oral Q6H PRN Mcarthur Rossetti, MD   650 mg at 05/27/17 2040   Or  . acetaminophen (TYLENOL) suppository 650 mg  650 mg Rectal Q6H PRN Mcarthur Rossetti, MD      . atorvastatin (LIPITOR) tablet 40 mg  40 mg Oral Daily Ivor Costa, MD   40 mg at 05/28/17 1028  . azithromycin (ZITHROMAX) tablet 250 mg  250 mg Oral Daily Ivor Costa, MD   250 mg at 05/28/17 1028  . bacitracin ointment   Topical BID Bonnell Public, MD      .  fenofibrate tablet 160 mg  160 mg Oral Daily Ivor Costa, MD   160 mg at 05/28/17 1028  . hydrALAZINE (APRESOLINE) injection 5 mg  5 mg Intravenous Q2H PRN Ivor Costa, MD      . HYDROcodone-acetaminophen (NORCO/VICODIN) 5-325 MG per tablet 1-2 tablet  1-2 tablet Oral Q6H PRN Mcarthur Rossetti, MD   2 tablet at 05/28/17 0246  . insulin aspart (novoLOG) injection 0-5 Units  0-5 Units Subcutaneous QHS Dana Allan I, MD      . insulin aspart (novoLOG) injection 0-9 Units  0-9 Units Subcutaneous TID WC Dana Allan I, MD   5 Units at 05/28/17 1700  .  loratadine (CLARITIN) tablet 10 mg  10 mg Oral Daily Ivor Costa, MD   10 mg at 05/28/17 1028  . losartan (COZAAR) tablet 100 mg  100 mg Oral Daily Ivor Costa, MD   100 mg at 05/28/17 1028  . menthol-cetylpyridinium (CEPACOL) lozenge 3 mg  1 lozenge Oral PRN Mcarthur Rossetti, MD       Or  . phenol (CHLORASEPTIC) mouth spray 1 spray  1 spray Mouth/Throat PRN Mcarthur Rossetti, MD      . methocarbamol (ROBAXIN) tablet 500 mg  500 mg Oral Q6H PRN Mcarthur Rossetti, MD   500 mg at 05/28/17 1118   Or  . methocarbamol (ROBAXIN) 500 mg in dextrose 5 % 50 mL IVPB  500 mg Intravenous Q6H PRN Mcarthur Rossetti, MD   Stopped at 05/27/17 1201  . methocarbamol (ROBAXIN) tablet 500 mg  500 mg Oral Q8H PRN Ivor Costa, MD   500 mg at 05/27/17 5102  . metoCLOPramide (REGLAN) tablet 5-10 mg  5-10 mg Oral Q8H PRN Mcarthur Rossetti, MD       Or  . metoCLOPramide (REGLAN) injection 5-10 mg  5-10 mg Intravenous Q8H PRN Mcarthur Rossetti, MD      . metoprolol succinate (TOPROL-XL) 24 hr tablet 50 mg  50 mg Oral Daily Ivor Costa, MD   50 mg at 05/28/17 1028  . morphine 2 MG/ML injection 0.5 mg  0.5 mg Intravenous Q2H PRN Mcarthur Rossetti, MD      . ondansetron [facility]) tablet 4 mg  4 mg Oral Q6H PRN Mcarthur Rossetti, MD       Or  . ondansetron [facility]) injection 4 mg  4 mg Intravenous Q6H PRN Mcarthur Rossetti, MD      . oxyCODONE-acetaminophen (PERCOCET/ROXICET) 5-325 MG per tablet 1 tablet  1 tablet Oral Q4H PRN Ivor Costa, MD   1 tablet at 05/27/17 2058  . polyethylene glycol (MIRALAX / GLYCOLAX) packet 17 g  17 g Oral Daily PRN Ivor Costa, MD      . sertraline (ZOLOFT) tablet 50 mg  50 mg Oral Daily Ivor Costa, MD   50 mg at 05/28/17 1028  . vitamin C (ASCORBIC ACID) tablet 500 mg  500 mg Oral BID Ivor Costa, MD   500 mg at 05/28/17 1028  . zolpidem (AMBIEN) tablet 5 mg  5 mg Oral QHS PRN Ivor Costa, MD         Discharge Medications: Please see discharge  summary for a list of discharge medications.  Relevant Imaging Results:  Relevant Lab Results:   Additional Information SS#: [id number] LCSW

## 2017-05-28 NOTE — Progress Notes (Signed)
Subjective: 1 Day Post-Op Procedure(s) (LRB): OPEN REDUCTION INTERNAL FIXATION (ORIF) PERIPROSTHETIC FRACTURE (Right) Patient reports pain as moderate.  Tolerated surgery well.  Acute on chronic blood loss anemia - did get transfusion yesterday.  Objective: Vital signs in last 24 hours: Temp:  [96.6 F (35.9 C)-100.2 F (37.9 C)] 98.6 F (37 C) (01/28 0455) Pulse Rate:  [68-81] 81 (01/28 0455) Resp:  [11-25] 16 (01/28 0455) BP: (97-159)/(37-77) 133/45 (01/28 0455) SpO2:  [98 %-100 %] 99 % (01/28 0455) Arterial Line BP: (129-176)/(43-61) 129/43 (01/27 1145)  Intake/Output from previous day: 01/27 0701 - 01/28 0700 In: 1905 [I.V.:1305; Blood:300; IV Piggyback:300] Out: 1300 [Urine:1100; Blood:200] Intake/Output this shift: No intake/output data recorded.  Recent Labs    05/26/17 1915 05/27/17 1135  HGB 9.6* 8.7*   Recent Labs    05/26/17 1915 05/27/17 0210 05/27/17 1135  WBC 11.4*  --  18.7*  RBC 3.47* 3.07* 3.15*  HCT 29.4*  --  26.6*  PLT 320  --  264   Recent Labs    05/26/17 1915  NA 139  K 3.6  CL 107  CO2 22  BUN 37*  CREATININE 1.00  GLUCOSE 319*  CALCIUM 9.1   Recent Labs    05/26/17 1915  INR 1.26    Intact pulses distally Dorsiflexion/Plantar flexion intact Incision: dressing C/D/I  Assessment/Plan: 1 Day Post-Op Procedure(s) (LRB): OPEN REDUCTION INTERNAL FIXATION (ORIF) PERIPROSTHETIC FRACTURE (Right) Up with therapy - will need to be non-weight bearing on her right hip for several weeks to months  Mcarthur Rossetti 05/28/2017, 7:49 AM

## 2017-05-29 ENCOUNTER — Encounter (HOSPITAL_COMMUNITY): Payer: Self-pay | Admitting: General Practice

## 2017-05-29 ENCOUNTER — Other Ambulatory Visit: Payer: Self-pay

## 2017-05-29 ENCOUNTER — Encounter
Admission: RE | Admit: 2017-05-29 | Discharge: 2017-05-29 | Disposition: A | Discharge status: Home / Self Care | Payer: Medicare Other | Source: Ambulatory Visit | Attending: Internal Medicine | Admitting: Internal Medicine

## 2017-05-29 DIAGNOSIS — I350 Nonrheumatic aortic (valve) stenosis: Secondary | ICD-10-CM

## 2017-05-29 DIAGNOSIS — S72331K Displaced oblique fracture of shaft of right femur, subsequent encounter for closed fracture with nonunion: Secondary | ICD-10-CM

## 2017-05-29 LAB — TYPE AND SCREEN
ABO/RH(D): O NEG
Antibody Screen: NEGATIVE
Unit division: 0
Unit division: 0

## 2017-05-29 LAB — CBC
HEMATOCRIT: 22.9 % — AB (ref 36.0–46.0)
HEMOGLOBIN: 7.5 g/dL — AB (ref 12.0–15.0)
MCH: 27.8 pg (ref 26.0–34.0)
MCHC: 32.8 g/dL (ref 30.0–36.0)
MCV: 84.8 fL (ref 78.0–100.0)
Platelets: 203 10*3/uL (ref 150–400)
RBC: 2.7 MIL/uL — AB (ref 3.87–5.11)
RDW: 15.3 % (ref 11.5–15.5)
WBC: 10.6 10*3/uL — ABNORMAL HIGH (ref 4.0–10.5)

## 2017-05-29 LAB — BASIC METABOLIC PANEL
Anion gap: 7 (ref 5–15)
BUN: 30 mg/dL — AB (ref 6–20)
CHLORIDE: 110 mmol/L (ref 101–111)
CO2: 20 mmol/L — AB (ref 22–32)
CREATININE: 1.25 mg/dL — AB (ref 0.44–1.00)
Calcium: 8.2 mg/dL — ABNORMAL LOW (ref 8.9–10.3)
GFR calc Af Amer: 42 mL/min — ABNORMAL LOW (ref >60)
GFR calc non Af Amer: 36 mL/min — ABNORMAL LOW (ref >60)
Glucose, Bld: 185 mg/dL — ABNORMAL HIGH (ref 65–99)
Potassium: 3.7 mmol/L (ref 3.5–5.1)
Sodium: 137 mmol/L (ref 135–145)

## 2017-05-29 LAB — GLUCOSE, CAPILLARY
GLUCOSE-CAPILLARY: 155 mg/dL — AB (ref 65–99)
GLUCOSE-CAPILLARY: 188 mg/dL — AB (ref 65–99)
Glucose-Capillary: 164 mg/dL — ABNORMAL HIGH (ref 65–99)
Glucose-Capillary: 208 mg/dL — ABNORMAL HIGH (ref 65–99)
Glucose-Capillary: 178 mg/dL — ABNORMAL HIGH (ref 65–99)

## 2017-05-29 LAB — BPAM RBC
Blood Product Expiration Date: 201901302359
Blood Product Expiration Date: 201901302359
ISSUE DATE / TIME: 201901270919
ISSUE DATE / TIME: 201901270919
Unit Type and Rh: 9500
Unit Type and Rh: 9500

## 2017-05-29 LAB — INFLUENZA PANEL BY PCR (TYPE A & B)
INFLAPCR: NEGATIVE
Influenza B By PCR: NEGATIVE

## 2017-05-29 MED ORDER — HYDROCODONE-ACETAMINOPHEN 5-325 MG PO TABS: 1.0000 | ORAL_TABLET | Freq: Four times a day (QID) | ORAL | 0 refills | Status: DC | PRN

## 2017-05-29 MED ORDER — INSULIN GLARGINE 100 UNIT/ML ~~LOC~~ SOLN
10.0000 [IU] | Freq: Every day | SUBCUTANEOUS | Status: DC
  Administered 2017-05-29 – 2017-05-30 (×2): 10 [IU] via SUBCUTANEOUS
  Filled 2017-05-29 (×2): qty 0.1

## 2017-05-29 NOTE — Progress Notes (Addendum)
Inpatient Rehabilitation  Discussed team's recommendations with daughter, [name] via phone.  Note temp over night and elevated WBC discussed with MD. Daughter also expressed concern about pain management and blood sugars and discussed with team.  Plan to work toward hopeful IP Rehab admission tomorrow, 05/30/17 pending medical readiness.  Call if questions.   Carmelia Roller., CCC/SLP Admission Coordinator  [place]  Cell [phone]

## 2017-05-29 NOTE — Social Work (Addendum)
CSW working on SNF as a back up as pt/family desires IP Rehab.  CSW discussed SNF offers with patient/daughter and they accept SNF bed from [facility].  CSW will continue to follow if IP Rehab cannot take patient, then patient can go to [facility].  CSW confirmed SNF bed offer from admissions at [facility].   CSW will continue to follow.  Elissa Hefty, LCSW Clinical Social Worker [phone]

## 2017-05-29 NOTE — Progress Notes (Signed)
PROGRESS NOTE    CHAE SHUSTER  GNO:[id number] DOB: 01-07-1923 DOA: 05/26/2017 PCP: Patient, No Pcp Per  Outpatient Specialists:   Brief Narrative: Patient is a 84 y.o. female with past medical history significant for hypertension, hyperlipidemia, depression, and CAD (had heart attack 02/11/17).  Patient was admitted following a fall, with associated right hip pain.  X-ray done revealed acute periprosthetic fracture involving the proximal right femur with displacement.  The patient underwent open reduction and internal fixation.  Discussed antiplatelet therapy with the orthopedic surgeon.  Also discussed antiplatelet therapy with the patient and the patient's daughter.  The patient tells me that she had significant bleeding while on Plavix.  The patient has had severe allergic reaction to aspirin.  Therefore, the patient cannot also have aspirin.  The orthopedic team is inclined towards not giving the patient any antiplatelet.  Physical therapy team input is highly appreciated.  The plan is to discharge patient to grab facility.  Hopefully, the patient will be discharged to a rehab facility in the morning, 05/30/2017.  Will continue to optimize patient's pain to avoid sympathetic surges.  Will continue metoprolol for cardiac prophylaxis.  Patient has been advised to ambulate as much as possible as the patient is not on any antiplatelet or DVT prophylaxis.    Assessment & Plan:   Principal Problem:   Closed right femur fracture  Active Problems:   Depression   HLD (hyperlipidemia)   Essential hypertension   Fall   Normocytic anemia   Abdominal distention   CAD (coronary artery disease) Possible rectal mass Uncontrolled blood sugar   Closed right peri-prosthetic femoral fracture. - Patient is status post open reduction and internal fixation.   -Adequate analgesia. -Continue beta-blocker.  -Pursue rehabilitation in the morning, 05/30/2017.    Coronary artery disease. - This is  stable. -No chest pain.  No shortness of breath. -Due to prior history of a significant GI bleed, the patient's Plavix was been discontinued.  Possible rectal mass. -This will need to be pursued for the after the patient gets through this acute phase. -Extent of workup will depend on the goal of care.  Diabetes mellitus. -Continue Accu-Cheks. -Consistent carbohydrate diet. -Continue sliding scale insulin coverage for now.  Moderate aortic Stenosis/Severe Pulmonary Hypertension/Severe Tricuspid regurgitation: Stable No symptoms of CHF, or, syncope.   Further management will depend on hospital course.  DVT prophylaxis: See above.   Code Status: Partial. Family Communication: Disposition Plan: Rehabilitation.   Consultants:   Orthopedics.  Procedures:   Open reduction and internal fixation.  Antimicrobials:   None.   Subjective: Pain is controlled. No chest pain no shortness of breath.  Objective: Vitals:   05/29/17 0400 05/29/17 0640 05/29/17 0737 05/29/17 1500  BP: (!) 134/48 (!) 131/45 (!) 145/60 (!) 144/49  Pulse: 92 84 87 85  Resp: 16 16  16   Temp: 98.4 F (36.9 C) 99.5 F (37.5 C)    TempSrc: Oral Oral  Oral  SpO2: 94% 94%  98%    Intake/Output Summary (Last 24 hours) at 05/29/2017 1700 Last data filed at 05/29/2017 1500 Gross per 24 hour  Intake 360 ml  Output 1175 ml  Net -815 ml   There were no vitals filed for this visit.  Examination:  General exam: Awake and alert.  Comfortable.  Not in any obvious distress.   Respiratory system: Clear to auscultation.  Cardiovascular system: S1 & S2, ejection systolic murmur.  No pedal edema. Gastrointestinal system: Abdomen is nondistended, soft and nontender. No  organomegaly or masses felt. Normal bowel sounds heard. Central nervous system: Sleeping at the moment. Extremities: No leg edema.  Data Reviewed: I have personally reviewed following labs and imaging studies  CBC: Recent Labs  Lab  05/26/17 1915 05/27/17 1022 05/27/17 1135 05/28/17 0724 05/29/17 0541  WBC 11.4*  --  18.7* 7.7 10.6*  NEUTROABS 6.8  --   --   --   --   HGB 9.6* 6.5* 8.7* 7.7* 7.5*  HCT 29.4* 19.0* 26.6* 22.9* 22.9*  MCV 84.7  --  84.4 85.8 84.8  PLT 320  --  264 174 814   Basic Metabolic Panel: Recent Labs  Lab 05/26/17 1915 05/27/17 1022 05/28/17 0724 05/29/17 0541  NA 139 144 138 137  K 3.6 3.4* 3.6 3.7  CL 107  --  110 110  CO2 22  --  21* 20*  GLUCOSE 319* 269* 193* 185*  BUN 37*  --  32* 30*  CREATININE 1.00  --  1.31* 1.25*  CALCIUM 9.1  --  8.3* 8.2*   GFR: Estimated Creatinine Clearance: 22.1 mL/min (A) (by C-G formula based on SCr of 1.25 mg/dL (H)). Liver Function Tests: No results for input(s): AST, ALT, ALKPHOS, BILITOT, PROT, ALBUMIN in the last 168 hours. No results for input(s): LIPASE, AMYLASE in the last 168 hours. No results for input(s): AMMONIA in the last 168 hours. Coagulation Profile: Recent Labs  Lab 05/26/17 1915  INR 1.26   Cardiac Enzymes: No results for input(s): CKTOTAL, CKMB, CKMBINDEX, TROPONINI in the last 168 hours. BNP (last 3 results) No results for input(s): PROBNP in the last 8760 hours. HbA1C: Recent Labs    05/28/17 0724  HGBA1C 7.0*   CBG: Recent Labs  Lab 05/28/17 2048 05/29/17 0143 05/29/17 0629 05/29/17 1141 05/29/17 1624  GLUCAP 239* 164* 155* 188* 208*   Lipid Profile: No results for input(s): CHOL, HDL, LDLCALC, TRIG, CHOLHDL, LDLDIRECT in the last 72 hours. Thyroid Function Tests: No results for input(s): TSH, T4TOTAL, FREET4, T3FREE, THYROIDAB in the last 72 hours. Anemia Panel: Recent Labs    05/27/17 0210  VITAMINB12 678  FOLATE 12.1  FERRITIN 192  TIBC 350  IRON 28  RETICCTPCT 1.2   Urine analysis:    Component Value Date/Time   COLORURINE YELLOW 05/27/2017 [place] 05/27/2017 0714   LABSPEC 1.023 05/27/2017 0714   PHURINE 5.0 05/27/2017 0714   GLUCOSEU >=500 (A) 05/27/2017 0714    GLUCOSEU NEGATIVE 03/29/2017 0830   HGBUR NEGATIVE 05/27/2017 [place] NEGATIVE 05/27/2017 0714   BILIRUBINUR neg 07/31/2013 1119   KETONESUR NEGATIVE 05/27/2017 0714   PROTEINUR 30 (A) 05/27/2017 0714   UROBILINOGEN 0.2 03/29/2017 0830   NITRITE NEGATIVE 05/27/2017 0714   LEUKOCYTESUR NEGATIVE 05/27/2017 0714   Sepsis Labs: @LABRCNTIP (procalcitonin:4,lacticidven:4)  )No results found for this or any previous visit (from the past 240 hour(s)).       Radiology Studies: No results found.      Scheduled Meds: . atorvastatin  40 mg Oral Daily  . azithromycin  250 mg Oral Daily  . bacitracin   Topical BID  . fenofibrate  160 mg Oral Daily  . insulin aspart  0-5 Units Subcutaneous QHS  . insulin aspart  0-9 Units Subcutaneous TID WC  . insulin glargine  10 Units Subcutaneous Daily  . loratadine  10 mg Oral Daily  . losartan  100 mg Oral Daily  . metoprolol succinate  50 mg Oral Daily  . sertraline  50  mg Oral Daily  . vitamin C  500 mg Oral BID   Continuous Infusions: . sodium chloride 75 mL/hr at 05/28/17 1606  . methocarbamol (ROBAXIN)  IV Stopped (05/27/17 1201)     LOS: 3 days    Time spent: [place]    Dana Allan, MD  [facility] Pager #: [phone] 7PM-7AM contact night coverage as above

## 2017-05-29 NOTE — Progress Notes (Signed)
Inpatient Diabetes Program Recommendations  AACE/ADA: New Consensus Statement on Inpatient Glycemic Control (2015)  Target Ranges:  Prepandial:   less than 140 mg/dL      Peak postprandial:   less than 180 mg/dL (1-2 hours)      Critically ill patients:  140 - 180 mg/dL   Lab Results  Component Value Date   GLUCAP 188 (H) 05/29/2017   HGBA1C 7.0 (H) 05/28/2017    Review of Glycemic Control Results for SARAY, CAPASSO (MRN [id number]) as of 05/29/2017 14:19  Ref. Range 05/29/2017 01:43 05/29/2017 05:41 05/29/2017 06:29 05/29/2017 11:41  Glucose-Capillary Latest Ref Range: 65 - 99 mg/dL 164 (H)  155 (H) 188 (H)     Stopped by to speak with patient and family regarding blood glucose control. Wanted to address questions and concerns, however, patient was sleeping and it wasn't a good time.  Spoke with RN and informed her that if the family or patient has further questions to reach out to the inpatient diabetes team.  Thanks, Bronson Curb, MSN, RNC-OB Diabetes Coordinator [phone] (8a-5p)

## 2017-05-29 NOTE — Progress Notes (Signed)
RT called regarding pt wheezing. RT went up to pt room to assess pt, pt had some mild wheezing. RT explained to family we could order treatments for the pt but family stated she did not want pt to be woken up in the middle of the night for treatments. RT will pass on to day time therapist and will continue to monitor as needed.

## 2017-05-29 NOTE — Progress Notes (Signed)
Patient ID: Maureen Morales, female   DOB: 07/09/1922, 84 y.o.   MRN: [id number] Very slow mobility with therapy.  Will need skilled nursing post-hospital stay.  Hgb below 8 with stable vitals.  DVT coverage from Ortho standpoint can be resuming her Plavix if indicated and a baby aspirin.  If can't resume Plavix, then 325 mg aspirin.  Would not use Lovenox due to her extensive surgery and high risk of bleeding that far outweighs her risk of DVT.  Will remain non-weight bearing on her right hip for quite sometime.

## 2017-05-29 NOTE — Clinical Social Work Note (Signed)
Clinical Social Work Assessment  Patient Details  Name: Maureen Morales MRN: [id number] Date of Birth: August 06, 1922  Date of referral:  05/29/17               Reason for consult:  Facility Placement                Permission sought to share information with:  Chartered certified accountant granted to share information::  Yes, Verbal Permission Granted  Name::     Nylani Michetti  Agency::  SNF  Relationship::  daughter  Contact Information:     Housing/Transportation Living arrangements for the past 2 months:  Single Family Home Source of Information:  Patient, Adult Children Patient Interpreter Needed:  None Criminal Activity/Legal Involvement Pertinent to Current Situation/Hospitalization:  No - Comment as needed Significant Relationships:  Adult Children, Parents, Other Family Members Lives with:  Relatives Do you feel safe going back to the place where you live?  No Need for family participation in patient care:  Yes (Comment)  Care giving concerns:  Pt from home with family and receives 24-hour care at home. Given her new impairment, patient is unable to return home at this time and will need SNF. Patient has experience with SNF and has gone to [facility] in the past.  CSW explained the SNF process/placement to patient and family at bedside.  Pt agreeable with SNF recommendations and daughter would like patient to go to IP rehab. CSW validated and it appears that consult to IP rehab has been made. CSW explained that CSW is working patient up for SNF as a back up plan if IP rehab can not take patient. Patient/family agreeable.   Social Worker assessment / plan:  CSW will f/u for disposition.  Employment status:  Retired Forensic scientist:  Medicare PT Recommendations:  [place] / Referral to community resources:  [place]  Patient/Family's Response to care:  Patient/family thanked CSW for assisting with SNF options. Family  indicated that they desire [facility] with private room as a back up plan. No issues of concerns with care.  Patient/Family's Understanding of and Emotional Response to Diagnosis, Current Treatment, and Prognosis:  Patient/family understands patient diagnosis and impairment and are agreeable to rehab. The family desires IP rehab vs. SNF. Pt has been to [facility] in the past and desires that SNF option if IP rehab cannot offer a bed. Pt has lots of support and 24 hour care at home and desires to return home once she is medically cleared from SNF. No issues or concerns identified.   Emotional Assessment Appearance:  Appears stated age Attitude/Demeanor/Rapport:  (Cooperative) Affect (typically observed):  Accepting, Appropriate Orientation:  Oriented to Self, Oriented to Place, Oriented to  Time, Oriented to Situation Alcohol / Substance use:  Not Applicable Psych involvement (Current and /or in the community):  No (Comment)  Discharge Needs  Concerns to be addressed:  Discharge Planning Concerns Readmission within the last 30 days:  Yes Current discharge risk:  Physical Impairment, Dependent with Mobility Barriers to Discharge:  No Barriers Identified   Normajean Baxter, LCSW 05/29/2017, 10:32 AM

## 2017-05-29 NOTE — Progress Notes (Signed)
Physical Therapy Treatment Patient Details Name: Maureen Morales MRN: [id number] DOB: 1923-04-15 Today's Date: 05/29/2017    History of Present Illness Pt is a 84 y/o F past medical history significant for hypertension, hyperlipidemia, depression, CAD (had heart attack 02/11/17) and history of a right hip hemiarthroplasty performed in [place] in October of this past year.  Patient was admitted following a mechanical fall, with associated right hip pain.  X-ray revealed acute periprosthetic fracture involving the proximal right femur with displacement. Pt is now s/p ORIF periprosthetic fracture     PT Comments    Pt progressing towards physical therapy goals. Pt was able to tolerate increased activity this session with no reports of increased pain. Pt continues to demonstrate good NWB on the RLE however requires cues to maintain WB status throughout session. Initiated therapeutic exercise this session and although pt engaged in activity, minimal effort noted during some exercises - unsure if pt had difficulty understanding what therapist was asking or if she is just weak. Will continue to follow.   Follow Up Recommendations  CIR;Supervision/Assistance - 24 hour     Equipment Recommendations  None recommended by PT    Recommendations for Other Services       Precautions / Restrictions Precautions Precautions: Fall Restrictions Weight Bearing Restrictions: Yes RLE Weight Bearing: Non weight bearing    Mobility  Bed Mobility Overal bed mobility: Needs Assistance Bed Mobility: Supine to Sit     Supine to sit: Mod assist;+2 for physical assistance;HOB elevated     General bed mobility comments: VC's for use of rails for support. Assist for LE advancement towards EOB and scooting assist provided with bed pad. Pt was able to scoot herself out of EOB once in an upright seated position.   Transfers Overall transfer level: Needs assistance Equipment used: Rolling walker (2  wheeled) Transfers: Sit to/from [facility] Sit to Stand: Min assist;+2 physical assistance;From elevated surface Stand pivot transfers: Mod assist;+2 physical assistance       General transfer comment: Transferred to the L this session, and pt was able to scoot L foot around towards chair with increased initiation. VC's for maintenance of WB status - therapist kept foot under pt's R foot to ensure she was maintaining NWB.   Ambulation/Gait                 Stairs            Wheelchair Mobility    Modified Rankin (Stroke Patients Only)       Balance Overall balance assessment: Needs assistance Sitting-balance support: Feet supported;No upper extremity supported Sitting balance-Leahy Scale: Fair     Standing balance support: Bilateral upper extremity supported;During functional activity Standing balance-Leahy Scale: Poor Standing balance comment: Reliant on UE support on walker.                             Cognition Arousal/Alertness: Awake/alert Behavior During Therapy: Flat affect Overall Cognitive Status: Impaired/Different from baseline Area of Impairment: Attention;Memory;Awareness;Problem solving;Safety/judgement                   Current Attention Level: Sustained Memory: Decreased recall of precautions;Decreased short-term memory Following Commands: Follows one step commands consistently;Follows one step commands with increased time;Follows multi-step commands inconsistently Safety/Judgement: Decreased awareness of safety;Decreased awareness of deficits Awareness: Intellectual Problem Solving: Slow processing;Decreased initiation;Difficulty sequencing;Requires verbal cues General Comments: Pt requiring increased cues for weight bearing status and repetition of  instruction      Exercises General Exercises - Lower Extremity Ankle Circles/Pumps: 10 reps Long Arc Quad: 10 reps Heel Slides: 10 reps Hip  ABduction/ADduction: 10 reps    General Comments        Pertinent Vitals/Pain Pain Assessment: Faces Faces Pain Scale: Hurts little more Pain Location: RLE with therapeutic exercise/ROM Pain Descriptors / Indicators: Operative site guarding;Discomfort;Grimacing Pain Intervention(s): Limited activity within patient's tolerance;Monitored during session;Repositioned    Home Living                      Prior Function            PT Goals (current goals can now be found in the care plan section) Acute Rehab PT Goals PT Goal Formulation: With patient/family Time For Goal Achievement: 06/11/17 Potential to Achieve Goals: Good Progress towards PT goals: Progressing toward goals    Frequency    Min 3X/week      PT Plan Current plan remains appropriate    Co-evaluation              AM-PAC PT "6 Clicks" Daily Activity  Outcome Measure  Difficulty turning over in bed (including adjusting bedclothes, sheets and blankets)?: Unable Difficulty moving from lying on back to sitting on the side of the bed? : Unable Difficulty sitting down on and standing up from a chair with arms (e.g., wheelchair, bedside commode, etc,.)?: Unable Help needed moving to and from a bed to chair (including a wheelchair)?: A Lot Help needed walking in hospital room?: A Lot Help needed climbing 3-5 steps with a railing? : Total 6 Click Score: 8    End of Session Equipment Utilized During Treatment: Gait belt Activity Tolerance: Patient tolerated treatment well Patient left: in chair;with call bell/phone within reach;with chair alarm set;with family/visitor present Nurse Communication: Mobility status PT Visit Diagnosis: Unsteadiness on feet (R26.81);Pain;Other abnormalities of gait and mobility (R26.89);Repeated falls (R29.6) Pain - Right/Left: Right Pain - part of body: Leg;Hip     Time: 1137-1210 PT Time Calculation (min) (ACUTE ONLY): 33 min  Charges:  $Gait Training: 8-22  mins $Therapeutic Exercise: 8-22 mins                    G Codes:       Rolinda Roan, PT, DPT Acute Rehabilitation Services Pager: Tina 05/29/2017, 2:08 PM

## 2017-05-29 NOTE — Discharge Instructions (Signed)
Remain non-weight bearing on her right leg until further notice. Current dressing can stay on for a week and get wet in the shower. New dry dressing as needed.

## 2017-05-30 ENCOUNTER — Inpatient Hospital Stay (HOSPITAL_COMMUNITY)
Admission: RE | Admit: 2017-05-30 | Discharge: 2017-06-13 | DRG: 560 | Disposition: A | Discharge status: Home Health | Payer: Medicare Other | Source: Intra-hospital | Attending: Physical Medicine & Rehabilitation | Admitting: Physical Medicine & Rehabilitation

## 2017-05-30 ENCOUNTER — Inpatient Hospital Stay (HOSPITAL_COMMUNITY): Payer: Medicare Other

## 2017-05-30 ENCOUNTER — Telehealth: Payer: Self-pay

## 2017-05-30 ENCOUNTER — Encounter (HOSPITAL_COMMUNITY): Payer: Self-pay

## 2017-05-30 ENCOUNTER — Other Ambulatory Visit: Payer: Self-pay

## 2017-05-30 DIAGNOSIS — I350 Nonrheumatic aortic (valve) stenosis: Secondary | ICD-10-CM | POA: Diagnosis present

## 2017-05-30 DIAGNOSIS — I2510 Atherosclerotic heart disease of native coronary artery without angina pectoris: Secondary | ICD-10-CM | POA: Diagnosis present

## 2017-05-30 DIAGNOSIS — L97419 Non-pressure chronic ulcer of right heel and midfoot with unspecified severity: Secondary | ICD-10-CM | POA: Diagnosis present

## 2017-05-30 DIAGNOSIS — Z823 Family history of stroke: Secondary | ICD-10-CM

## 2017-05-30 DIAGNOSIS — L89613 Pressure ulcer of right heel, stage 3: Secondary | ICD-10-CM

## 2017-05-30 DIAGNOSIS — N183 Chronic kidney disease, stage 3 unspecified: Secondary | ICD-10-CM

## 2017-05-30 DIAGNOSIS — R609 Edema, unspecified: Secondary | ICD-10-CM | POA: Diagnosis not present

## 2017-05-30 DIAGNOSIS — S72001S Fracture of unspecified part of neck of right femur, sequela: Secondary | ICD-10-CM

## 2017-05-30 DIAGNOSIS — I252 Old myocardial infarction: Secondary | ICD-10-CM

## 2017-05-30 DIAGNOSIS — I5032 Chronic diastolic (congestive) heart failure: Secondary | ICD-10-CM | POA: Diagnosis present

## 2017-05-30 DIAGNOSIS — E46 Unspecified protein-calorie malnutrition: Secondary | ICD-10-CM | POA: Diagnosis not present

## 2017-05-30 DIAGNOSIS — I10 Essential (primary) hypertension: Secondary | ICD-10-CM | POA: Diagnosis not present

## 2017-05-30 DIAGNOSIS — I130 Hypertensive heart and chronic kidney disease with heart failure and stage 1 through stage 4 chronic kidney disease, or unspecified chronic kidney disease: Secondary | ICD-10-CM | POA: Diagnosis present

## 2017-05-30 DIAGNOSIS — M9701XS Periprosthetic fracture around internal prosthetic right hip joint, sequela: Secondary | ICD-10-CM | POA: Diagnosis not present

## 2017-05-30 DIAGNOSIS — L89610 Pressure ulcer of right heel, unstageable: Secondary | ICD-10-CM

## 2017-05-30 DIAGNOSIS — Z886 Allergy status to analgesic agent status: Secondary | ICD-10-CM

## 2017-05-30 DIAGNOSIS — W19XXXD Unspecified fall, subsequent encounter: Secondary | ICD-10-CM | POA: Diagnosis present

## 2017-05-30 DIAGNOSIS — Z803 Family history of malignant neoplasm of breast: Secondary | ICD-10-CM

## 2017-05-30 DIAGNOSIS — E1151 Type 2 diabetes mellitus with diabetic peripheral angiopathy without gangrene: Secondary | ICD-10-CM | POA: Diagnosis present

## 2017-05-30 DIAGNOSIS — Z9181 History of falling: Secondary | ICD-10-CM | POA: Diagnosis not present

## 2017-05-30 DIAGNOSIS — E1122 Type 2 diabetes mellitus with diabetic chronic kidney disease: Secondary | ICD-10-CM | POA: Diagnosis present

## 2017-05-30 DIAGNOSIS — K603 Anal fistula: Secondary | ICD-10-CM | POA: Diagnosis present

## 2017-05-30 DIAGNOSIS — I169 Hypertensive crisis, unspecified: Secondary | ICD-10-CM | POA: Diagnosis not present

## 2017-05-30 DIAGNOSIS — E876 Hypokalemia: Secondary | ICD-10-CM | POA: Diagnosis not present

## 2017-05-30 DIAGNOSIS — E11621 Type 2 diabetes mellitus with foot ulcer: Secondary | ICD-10-CM | POA: Diagnosis present

## 2017-05-30 DIAGNOSIS — M9701XD Periprosthetic fracture around internal prosthetic right hip joint, subsequent encounter: Secondary | ICD-10-CM | POA: Diagnosis not present

## 2017-05-30 DIAGNOSIS — E1165 Type 2 diabetes mellitus with hyperglycemia: Secondary | ICD-10-CM | POA: Diagnosis present

## 2017-05-30 DIAGNOSIS — R0989 Other specified symptoms and signs involving the circulatory and respiratory systems: Secondary | ICD-10-CM

## 2017-05-30 DIAGNOSIS — E119 Type 2 diabetes mellitus without complications: Secondary | ICD-10-CM | POA: Diagnosis not present

## 2017-05-30 DIAGNOSIS — Z96641 Presence of right artificial hip joint: Secondary | ICD-10-CM

## 2017-05-30 DIAGNOSIS — S72301D Unspecified fracture of shaft of right femur, subsequent encounter for closed fracture with routine healing: Secondary | ICD-10-CM | POA: Diagnosis present

## 2017-05-30 DIAGNOSIS — J45909 Unspecified asthma, uncomplicated: Secondary | ICD-10-CM | POA: Diagnosis present

## 2017-05-30 DIAGNOSIS — J4520 Mild intermittent asthma, uncomplicated: Secondary | ICD-10-CM

## 2017-05-30 DIAGNOSIS — Z885 Allergy status to narcotic agent status: Secondary | ICD-10-CM

## 2017-05-30 DIAGNOSIS — M9701XA Periprosthetic fracture around internal prosthetic right hip joint, initial encounter: Secondary | ICD-10-CM

## 2017-05-30 DIAGNOSIS — H9192 Unspecified hearing loss, left ear: Secondary | ICD-10-CM | POA: Diagnosis present

## 2017-05-30 DIAGNOSIS — S0003XD Contusion of scalp, subsequent encounter: Secondary | ICD-10-CM

## 2017-05-30 DIAGNOSIS — D62 Acute posthemorrhagic anemia: Secondary | ICD-10-CM | POA: Diagnosis present

## 2017-05-30 DIAGNOSIS — N1832 Chronic kidney disease, stage 3b: Secondary | ICD-10-CM

## 2017-05-30 DIAGNOSIS — E8809 Other disorders of plasma-protein metabolism, not elsewhere classified: Secondary | ICD-10-CM | POA: Diagnosis present

## 2017-05-30 DIAGNOSIS — E785 Hyperlipidemia, unspecified: Secondary | ICD-10-CM | POA: Diagnosis present

## 2017-05-30 DIAGNOSIS — Z800 Family history of malignant neoplasm of digestive organs: Secondary | ICD-10-CM

## 2017-05-30 DIAGNOSIS — Z6825 Body mass index (BMI) 25.0-25.9, adult: Secondary | ICD-10-CM

## 2017-05-30 LAB — CBC
HEMATOCRIT: 20.8 % — AB (ref 36.0–46.0)
Hemoglobin: 7 g/dL — ABNORMAL LOW (ref 12.0–15.0)
MCH: 28.5 pg (ref 26.0–34.0)
MCHC: 33.7 g/dL (ref 30.0–36.0)
MCV: 84.6 fL (ref 78.0–100.0)
Platelets: 229 10*3/uL (ref 150–400)
RBC: 2.46 MIL/uL — AB (ref 3.87–5.11)
RDW: 15.2 % (ref 11.5–15.5)
WBC: 9.4 10*3/uL (ref 4.0–10.5)

## 2017-05-30 LAB — GLUCOSE, CAPILLARY
Glucose-Capillary: 149 mg/dL — ABNORMAL HIGH (ref 65–99)
Glucose-Capillary: 238 mg/dL — ABNORMAL HIGH (ref 65–99)
Glucose-Capillary: 227 mg/dL — ABNORMAL HIGH (ref 65–99)
Glucose-Capillary: 179 mg/dL — ABNORMAL HIGH (ref 65–99)

## 2017-05-30 LAB — PREPARE RBC (CROSSMATCH)

## 2017-05-30 MED ORDER — TRAZODONE HCL 50 MG PO TABS
25.0000 mg | ORAL_TABLET | Freq: Every evening | ORAL | Status: DC | PRN
  Administered 2017-06-10: 50 mg via ORAL
  Filled 2017-05-30 (×3): qty 1

## 2017-05-30 MED ORDER — PRO-STAT SUGAR FREE PO LIQD
30.0000 mL | Freq: Two times a day (BID) | ORAL | Status: DC
  Administered 2017-05-31 – 2017-06-11 (×16): 30 mL via ORAL
  Filled 2017-05-30 (×25): qty 30

## 2017-05-30 MED ORDER — LOSARTAN POTASSIUM 50 MG PO TABS
100.0000 mg | ORAL_TABLET | Freq: Every day | ORAL | Status: DC
  Administered 2017-05-31 – 2017-06-13 (×14): 100 mg via ORAL
  Filled 2017-05-30 (×14): qty 2

## 2017-05-30 MED ORDER — SODIUM CHLORIDE 0.9 % IV SOLN: Freq: Once | INTRAVENOUS | Status: DC

## 2017-05-30 MED ORDER — FUROSEMIDE 10 MG/ML IJ SOLN
20.0000 mg | Freq: Once | INTRAMUSCULAR | Status: AC
  Administered 2017-05-31: 20 mg via INTRAVENOUS
  Filled 2017-05-30: qty 2

## 2017-05-30 MED ORDER — PROCHLORPERAZINE 25 MG RE SUPP: 12.5000 mg | Freq: Four times a day (QID) | RECTAL | Status: DC | PRN

## 2017-05-30 MED ORDER — FENOFIBRATE 160 MG PO TABS: ORAL_TABLET | ORAL | 2 refills | Status: DC

## 2017-05-30 MED ORDER — LORATADINE 10 MG PO TABS
10.0000 mg | ORAL_TABLET | Freq: Every day | ORAL | Status: DC
  Administered 2017-05-31 – 2017-06-13 (×14): 10 mg via ORAL
  Filled 2017-05-30 (×14): qty 1

## 2017-05-30 MED ORDER — GLUCOSE BLOOD VI STRP: ORAL_STRIP | 5 refills | Status: DC

## 2017-05-30 MED ORDER — POLYETHYLENE GLYCOL 3350 17 G PO PACK: 17.0000 g | PACK | Freq: Every day | ORAL | 0 refills | Status: DC | PRN

## 2017-05-30 MED ORDER — LEVALBUTEROL HCL 0.63 MG/3ML IN NEBU
0.6300 mg | INHALATION_SOLUTION | Freq: Three times a day (TID) | RESPIRATORY_TRACT | Status: DC
  Administered 2017-05-30 – 2017-05-31 (×4): 0.63 mg via RESPIRATORY_TRACT
  Filled 2017-05-30 (×4): qty 3

## 2017-05-30 MED ORDER — NALOXONE HCL 0.4 MG/ML IJ SOLN: 0.4000 mg | INTRAMUSCULAR | Status: DC | PRN

## 2017-05-30 MED ORDER — ACETAMINOPHEN 325 MG PO TABS
650.0000 mg | ORAL_TABLET | Freq: Once | ORAL | Status: DC
  Filled 2017-05-30: qty 2

## 2017-05-30 MED ORDER — FLEET ENEMA 7-19 GM/118ML RE ENEM: 1.0000 | ENEMA | Freq: Once | RECTAL | Status: DC | PRN

## 2017-05-30 MED ORDER — ALUM & MAG HYDROXIDE-SIMETH 200-200-20 MG/5ML PO SUSP: 30.0000 mL | ORAL | Status: DC | PRN

## 2017-05-30 MED ORDER — METHOCARBAMOL 500 MG PO TABS: 500.0000 mg | ORAL_TABLET | Freq: Three times a day (TID) | ORAL | 0 refills | Status: DC | PRN

## 2017-05-30 MED ORDER — INSULIN ASPART 100 UNIT/ML ~~LOC~~ SOLN
0.0000 [IU] | Freq: Three times a day (TID) | SUBCUTANEOUS | Status: DC
  Administered 2017-05-31 (×2): 3 [IU] via SUBCUTANEOUS
  Administered 2017-05-31: 1 [IU] via SUBCUTANEOUS
  Administered 2017-06-01: 3 [IU] via SUBCUTANEOUS
  Administered 2017-06-01: 2 [IU] via SUBCUTANEOUS
  Administered 2017-06-01: 5 [IU] via SUBCUTANEOUS
  Administered 2017-06-02: 1 [IU] via SUBCUTANEOUS
  Administered 2017-06-02 (×2): 3 [IU] via SUBCUTANEOUS
  Administered 2017-06-03: 1 [IU] via SUBCUTANEOUS
  Administered 2017-06-03: 2 [IU] via SUBCUTANEOUS
  Administered 2017-06-04 (×2): 1 [IU] via SUBCUTANEOUS
  Administered 2017-06-04: 2 [IU] via SUBCUTANEOUS
  Administered 2017-06-05 (×2): 1 [IU] via SUBCUTANEOUS
  Administered 2017-06-06: 3 [IU] via SUBCUTANEOUS
  Administered 2017-06-06: 1 [IU] via SUBCUTANEOUS
  Administered 2017-06-07: 2 [IU] via SUBCUTANEOUS
  Administered 2017-06-07 – 2017-06-09 (×3): 1 [IU] via SUBCUTANEOUS
  Administered 2017-06-09: 2 [IU] via SUBCUTANEOUS
  Administered 2017-06-10: 1 [IU] via SUBCUTANEOUS
  Administered 2017-06-10: 2 [IU] via SUBCUTANEOUS
  Administered 2017-06-11: 1 [IU] via SUBCUTANEOUS
  Administered 2017-06-12: 3 [IU] via SUBCUTANEOUS

## 2017-05-30 MED ORDER — GLIPIZIDE 5 MG PO TABS: 2.5000 mg | ORAL_TABLET | Freq: Every day | ORAL | 1 refills | Status: DC

## 2017-05-30 MED ORDER — METOPROLOL SUCCINATE ER 50 MG PO TB24
50.0000 mg | ORAL_TABLET | Freq: Every day | ORAL | Status: DC
  Administered 2017-05-31 – 2017-06-02 (×3): 50 mg via ORAL
  Filled 2017-05-30 (×3): qty 1

## 2017-05-30 MED ORDER — VITAMIN D (ERGOCALCIFEROL) 1.25 MG (50000 UNIT) PO CAPS: 50000.0000 [IU] | ORAL_CAPSULE | ORAL | 0 refills | Status: DC

## 2017-05-30 MED ORDER — DIPHENHYDRAMINE HCL 25 MG PO CAPS
25.0000 mg | ORAL_CAPSULE | Freq: Once | ORAL | Status: AC
  Administered 2017-05-30: 25 mg via ORAL
  Filled 2017-05-30: qty 1

## 2017-05-30 MED ORDER — ATORVASTATIN CALCIUM 40 MG PO TABS
40.0000 mg | ORAL_TABLET | Freq: Every day | ORAL | Status: DC
Start: 2017-05-31 — End: 2017-06-13
  Administered 2017-05-31 – 2017-06-13 (×14): 40 mg via ORAL
  Filled 2017-05-30 (×14): qty 1

## 2017-05-30 MED ORDER — VITAMIN C 500 MG PO TABS
500.0000 mg | ORAL_TABLET | Freq: Two times a day (BID) | ORAL | Status: DC
  Administered 2017-05-31 – 2017-06-13 (×27): 500 mg via ORAL
  Filled 2017-05-30 (×28): qty 1

## 2017-05-30 MED ORDER — ZOLPIDEM TARTRATE 5 MG PO TABS
5.0000 mg | ORAL_TABLET | Freq: Every evening | ORAL | Status: DC | PRN
  Administered 2017-06-05: 5 mg via ORAL
  Filled 2017-05-30: qty 1

## 2017-05-30 MED ORDER — DIPHENHYDRAMINE HCL 12.5 MG/5ML PO ELIX: 12.5000 mg | ORAL_SOLUTION | Freq: Four times a day (QID) | ORAL | Status: DC | PRN

## 2017-05-30 MED ORDER — METOPROLOL SUCCINATE ER 50 MG PO TB24: 50.0000 mg | ORAL_TABLET | Freq: Every day | ORAL | 2 refills | Status: DC

## 2017-05-30 MED ORDER — BACITRACIN ZINC 500 UNIT/GM EX OINT
TOPICAL_OINTMENT | Freq: Two times a day (BID) | CUTANEOUS | Status: AC
  Administered 2017-05-31 – 2017-06-01 (×3): via TOPICAL
  Administered 2017-06-01 – 2017-06-02 (×2): 1 via TOPICAL
  Administered 2017-06-02 – 2017-06-03 (×3): via TOPICAL
  Filled 2017-05-30 (×2): qty 28.35

## 2017-05-30 MED ORDER — PROCHLORPERAZINE EDISYLATE 5 MG/ML IJ SOLN: 5.0000 mg | Freq: Four times a day (QID) | INTRAMUSCULAR | Status: DC | PRN

## 2017-05-30 MED ORDER — PROCHLORPERAZINE MALEATE 5 MG PO TABS: 5.0000 mg | ORAL_TABLET | Freq: Four times a day (QID) | ORAL | Status: DC | PRN

## 2017-05-30 MED ORDER — POLYETHYLENE GLYCOL 3350 17 G PO PACK: 17.0000 g | PACK | Freq: Every day | ORAL | Status: DC | PRN

## 2017-05-30 MED ORDER — BACITRACIN ZINC 500 UNIT/GM EX OINT: TOPICAL_OINTMENT | Freq: Two times a day (BID) | CUTANEOUS | 0 refills | Status: DC

## 2017-05-30 MED ORDER — FUROSEMIDE 10 MG/ML IJ SOLN
20.0000 mg | Freq: Once | INTRAMUSCULAR | Status: AC
  Administered 2017-05-30: 20 mg via INTRAVENOUS
  Filled 2017-05-30: qty 2

## 2017-05-30 MED ORDER — INSULIN GLARGINE 100 UNIT/ML ~~LOC~~ SOLN
10.0000 [IU] | Freq: Every day | SUBCUTANEOUS | Status: DC
  Administered 2017-05-31 – 2017-06-04 (×5): 10 [IU] via SUBCUTANEOUS
  Filled 2017-05-30 (×5): qty 0.1

## 2017-05-30 MED ORDER — BISACODYL 10 MG RE SUPP: 10.0000 mg | Freq: Every day | RECTAL | Status: DC | PRN

## 2017-05-30 MED ORDER — PHENOL 1.4 % MT LIQD: 1.0000 | OROMUCOSAL | Status: DC | PRN

## 2017-05-30 MED ORDER — SODIUM CHLORIDE 0.9 % IV SOLN
Freq: Once | INTRAVENOUS | Status: AC
  Administered 2017-05-30: 21:00:00 via INTRAVENOUS

## 2017-05-30 MED ORDER — ACETAMINOPHEN 325 MG PO TABS
325.0000 mg | ORAL_TABLET | ORAL | Status: DC | PRN
  Administered 2017-05-30 – 2017-06-13 (×7): 650 mg via ORAL
  Filled 2017-05-30 (×6): qty 2

## 2017-05-30 MED ORDER — ATORVASTATIN CALCIUM 40 MG PO TABS: 40.0000 mg | ORAL_TABLET | Freq: Every day | ORAL | 3 refills | Status: DC

## 2017-05-30 MED ORDER — SERTRALINE HCL 50 MG PO TABS: 50.0000 mg | ORAL_TABLET | Freq: Every day | ORAL | 1 refills | Status: DC

## 2017-05-30 MED ORDER — GUAIFENESIN-DM 100-10 MG/5ML PO SYRP: 5.0000 mL | ORAL_SOLUTION | Freq: Four times a day (QID) | ORAL | Status: DC | PRN

## 2017-05-30 MED ORDER — INSULIN ASPART 100 UNIT/ML ~~LOC~~ SOLN
0.0000 [IU] | Freq: Every day | SUBCUTANEOUS | Status: DC
  Administered 2017-05-31 – 2017-06-03 (×2): 2 [IU] via SUBCUTANEOUS

## 2017-05-30 MED ORDER — FENOFIBRATE 160 MG PO TABS
160.0000 mg | ORAL_TABLET | Freq: Every day | ORAL | Status: DC
  Administered 2017-05-31 – 2017-06-13 (×14): 160 mg via ORAL
  Filled 2017-05-30 (×14): qty 1

## 2017-05-30 MED ORDER — HYDROCODONE-ACETAMINOPHEN 5-325 MG PO TABS
1.0000 | ORAL_TABLET | ORAL | Status: DC | PRN
  Administered 2017-05-31 (×2): 2 via ORAL
  Administered 2017-06-04: 1 via ORAL
  Administered 2017-06-04: 2 via ORAL
  Administered 2017-06-05: 1 via ORAL
  Administered 2017-06-05 – 2017-06-06 (×2): 2 via ORAL
  Filled 2017-05-30: qty 2
  Filled 2017-05-30 (×2): qty 1
  Filled 2017-05-30 (×5): qty 2

## 2017-05-30 MED ORDER — INSULIN GLARGINE 100 UNIT/ML ~~LOC~~ SOLN: 10.0000 [IU] | Freq: Every day | SUBCUTANEOUS | 11 refills | Status: DC

## 2017-05-30 MED ORDER — MENTHOL 3 MG MT LOZG: 1.0000 | LOZENGE | OROMUCOSAL | Status: DC | PRN

## 2017-05-30 MED ORDER — SERTRALINE HCL 50 MG PO TABS
50.0000 mg | ORAL_TABLET | Freq: Every day | ORAL | Status: DC
  Administered 2017-05-31 – 2017-06-13 (×14): 50 mg via ORAL
  Filled 2017-05-30 (×14): qty 1

## 2017-05-30 MED ORDER — LOSARTAN POTASSIUM 100 MG PO TABS: 100.0000 mg | ORAL_TABLET | Freq: Every day | ORAL | 1 refills | Status: DC

## 2017-05-30 NOTE — Discharge Summary (Addendum)
Physician Discharge Summary  Patient ID: PENDA VENTURI MRN: [id number] DOB/AGE: Jan 31, 1923 84 y.o.  Admit date: 05/26/2017 Discharge date: 05/30/2017  Admission Diagnoses:  Discharge Diagnoses:    Closed right femur periprosthetic fracture ([place])   Status post open reduction and internal fixation of closed right femur periprosthetic fracture.   Possible rectal mass (this will need further workup when patient is more stable, depending on the goal of care)   Moderate aortic stenosis.   Severe pulmonary hypertension.   Severe tricuspid regurgitation.   Depression   HLD (hyperlipidemia)   Essential hypertension   Fall   Normocytic anemia   Abdominal distention   CAD (coronary artery disease)   Benign essential HTN   Acute blood loss anemia   Diabetes mellitus type 2 in nonobese (HCC)   AKI (acute kidney injury) ([place])   Post-operative pain   Discharged Condition: stable  Hospital Course: Patient is a 84 year old Caucasian female, with past medical history significant for hypertension, hyperlipidemia, depression, diabetes mellitus and CAD (had heart attack 02/11/17).  Patient was admitted following a fall, with associated right hip pain.  X-ray done revealed acute periprosthetic fracture involving the proximal right femur with displacement.  The patient was admitted for further assessment and management.  The patient's pain was controlled.  Orthopedic team was consulted.  The patient was seen by Dr. Jean Rosenthal.  The patient underwent open reduction and internal fixation of right femur periprosthetic fracture.  The patient could not be given antiplatelet therapy, as the patient developed significant GI bleed following Plavix treatment.  The patient also has severe allergies to aspirin.  This was discussed extensively with the orthopedic team.  Postop, the patient has continued to improve.  The patient's pain is well controlled.  Heart rate is controlled.  Blood pressure was  optimized.  The patient's blood sugar was monitored during the hospital stay.  The patient was started on subcutaneous Lantus 10 units daily.  There have been need to monitor the patient's blood sugar closely, and continue to control the blood sugar as deemed necessary.  The night prior to discharge, temperature of 101.3 F was documented.  Chest x-ray has not showed any acute findings.  The patient has no new symptoms.  The patient has been advised to continue incentive spirometry every 1-2 hours.  Currently continue to monitor patient's temperature.  During the hospital stay, echocardiogram done revealed moderate aortic stenosis, severe pulmonary hypertension and severe tricuspid regurgitation.  The PCP should consider referring patient to cardiologist and pulmonologist if not already done.   Physical therapy assessed patient and inpatient rehabilitation has been advised.  The patient will be discharged to a rehab facility.  Input from the orthopedic team is highly appreciated.   Consults: orthopedic surgery  Significant Diagnostic Studies: radiology: X-Ray: Right femur x-ray revealed closed right femur periprosthetic fracture.  Discharge Exam: Blood pressure (!) 145/48, pulse 84, temperature (!) 97.3 F (36.3 C), temperature source Oral, resp. rate 17, SpO2 98 %.   Disposition: 01-Home or Self Care  Discharge Instructions    Call MD for:   Complete by:  As directed    Call MD with worsening of symptoms   Diet - low sodium heart healthy   Complete by:  As directed    Diet Carb Modified   Complete by:  As directed    Discharge instructions   Complete by:  As directed    Non weight bearing on right lower extremity (that was operated on)   Increase  activity slowly   Complete by:  As directed    Non weight bearing   Complete by:  As directed    Laterality:  right   Extremity:  Lower     Allergies as of 05/30/2017      Reactions   Asa [aspirin] Anaphylaxis   Aspirin Swelling    Atropine Other (See Comments)   Reaction: unknown   Belladonna Alkaloids Other (See Comments)   Reaction: unknown   Codeine Other (See Comments)   Reaction: unknown   Darvon Other (See Comments)   Reaction: unknown   Darvon [propoxyphene] Nausea And Vomiting   Demerol [meperidine] Nausea And Vomiting   Ferrous Sulfate Diarrhea   Meperidine And Related    Methocarbamol Other (See Comments)   Reaction: unknown   Penicillins Other (See Comments)   Reaction: unknown Has patient had a PCN reaction causing immediate rash, facial/tongue/throat swelling, SOB or lightheadedness with hypotension: Unknown Has patient had a PCN reaction causing severe rash involving mucus membranes or skin necrosis: Unknown Has patient had a PCN reaction that required hospitalization: Unknown Has patient had a PCN reaction occurring within the last 10 years: Unknown If all of the above answers are "NO", then may proceed with Cephalosporin use.   Penicillins Other (See Comments)   Pt and family unsure of details   Sulfa Antibiotics Other (See Comments)   Reaction: unknown   Iron Other (See Comments)   High dose prescription gives her diarrhea      Medication List    TAKE these medications   atorvastatin 40 MG tablet Commonly known as:  LIPITOR Take 40 mg by mouth daily. What changed:  Another medication with the same name was removed. Continue taking this medication, and follow the directions you see here.   bacitracin ointment Apply topically 2 (two) times daily.   fenofibrate 160 MG tablet Take 160 mg by mouth daily. What changed:  Another medication with the same name was removed. Continue taking this medication, and follow the directions you see here.   glipiZIDE 5 MG tablet Commonly known as:  GLUCOTROL Take 0.5 tablets (2.5 mg total) by mouth daily before breakfast.   glucose blood test strip Commonly known as:  BAYER CONTOUR TEST Use as instructed   HYDROcodone-acetaminophen 5-325 MG  tablet Commonly known as:  NORCO/VICODIN Take 1-2 tablets by mouth every 6 (six) hours as needed for moderate pain. What changed:    how much to take  when to take this   insulin glargine 100 UNIT/ML injection Commonly known as:  LANTUS Inject 0.1 mLs (10 Units total) into the skin daily. Start taking on:  05/31/2017   loratadine 10 MG tablet Commonly known as:  CLARITIN Take 10 mg daily by mouth. What changed:  Another medication with the same name was removed. Continue taking this medication, and follow the directions you see here.   losartan 100 MG tablet Commonly known as:  COZAAR Take 100 mg by mouth daily. What changed:  Another medication with the same name was removed. Continue taking this medication, and follow the directions you see here.   methocarbamol 500 MG tablet Commonly known as:  ROBAXIN Take 1 tablet (500 mg total) by mouth every 8 (eight) hours as needed for muscle spasms.   metoprolol succinate 50 MG 24 hr tablet Commonly known as:  TOPROL-XL Take 50 mg by mouth daily. Take with or immediately following a meal. What changed:  Another medication with the same name was removed. Continue taking this  medication, and follow the directions you see here.   nitroGLYCERIN 0.4 MG SL tablet Commonly known as:  NITROSTAT Place 1 tablet (0.4 mg total) under the tongue every 5 (five) minutes as needed for chest pain.   polyethylene glycol packet Commonly known as:  MIRALAX / GLYCOLAX Take 17 g by mouth daily as needed for mild constipation.   sertraline 50 MG tablet Commonly known as:  ZOLOFT Take 50 mg by mouth daily. What changed:  Another medication with the same name was removed. Continue taking this medication, and follow the directions you see here.   vitamin C 500 MG tablet Commonly known as:  ASCORBIC ACID Take 1 tablet (500 mg total) by mouth 2 (two) times daily. What changed:  Another medication with the same name was removed. Continue taking this  medication, and follow the directions you see here.   Vitamin D (Ergocalciferol) 50000 units Caps capsule Commonly known as:  DRISDOL Take 1 capsule (50,000 Units total) by mouth every 7 (seven) days.            Discharge Care Instructions  (From admission, onward)        Start     Ordered   05/29/17 0000  Non weight bearing    Question Answer Comment  Laterality right   Extremity Lower      05/29/17 [id number]      Contact information for follow-up providers    Mcarthur Rossetti, MD. Schedule an appointment as soon as possible for a visit in 2 week(s).   Specialty:  Orthopedic Surgery Contact information: [place] [address] [phone]            Contact information for after-discharge care    Destination    [facility] .   Service:  Skilled Nursing Contact information: [address] [place] [phone]                  Signed: Bonnell Public 05/30/2017, 3:26 PM   Addendum: Discussed with Dr. Posey Pronto, rehabilitation team physician.  Based on patient's hemoglobin of 7 g/dL, the patient will be transfused 2 units of packed red blood cells prior to transfer to rehab.  Methocarbamol showed also be discontinued from the patient's medication list due to prior reported medication allergy.  Thanks.

## 2017-05-30 NOTE — Telephone Encounter (Signed)
[facility] since [facility] has closed.

## 2017-05-30 NOTE — Progress Notes (Addendum)
Inpatient Diabetes Program Recommendations  AACE/ADA: New Consensus Statement on Inpatient Glycemic Control (2015)  Target Ranges:  Prepandial:   less than 140 mg/dL      Peak postprandial:   less than 180 mg/dL (1-2 hours)      Critically ill patients:  140 - 180 mg/dL   Lab Results  Component Value Date   GLUCAP 238 (H) 05/30/2017   HGBA1C 7.0 (H) 05/28/2017    Review of Glycemic Control  Results for WYNETTE, JERSEY (MRN [id number]) as of 05/30/2017 13:11  Ref. Range 05/29/2017 22:14 05/30/2017 05:31 05/30/2017 06:31 05/30/2017 12:26  Glucose-Capillary Latest Ref Range: 65 - 99 mg/dL 178 (H)  149 (H) 238 (H)   Diabetes history: None noted on H&P but noted in merged chart pt has DM2 hx Outpatient Diabetes medications: Glipizide 2.5 mg in PM Current orders for Inpatient glycemic control: Novolog 0-9 units TID with meals, Novolog 0-5 units QHS  Inpatient Diabetes Program Recommendations:  Spoke with patient, family and caregiver regarding home regimen. Apparently, patient has had history of Type 2 DM. Has tried Metformin and Glipizide, but has had difficulty with medications and experiencing hypoglycemic episodes. Patient and caregiver states, "blood sugars would be in the 60's while on the Glipizide, so we changed how it was given." Caregiver checks blood sugars regularly and has supplies. Reviewed patient's current A1c of 7%. Explained what a A1c is and what it measures. Also reviewed goal A1c with patient, importance of good glucose control @ home, and blood sugar goals. We reviewed diet, carbs and making choices that were suitable for someone with diabetes. We also discussed the importance of proper nutrition. Patient usually only eats 1/3 to 1/2 of the meal, so getting in carbs is also a concern while taking the Glipizide. Discussed with the patient that Glipizide is a Sulfonylurea, which increases the production of insulin and can cause lows. Encouraged the patient and caregiver to discuss  this at next visit with PCP. At this time patient doesn't have any further questions.  I think a safer option like Prandin or Starlix to cover carbs would be a safer outpatient alternative, compared with a sulfonylurea like Glipizide.    Thanks, Bronson Curb, MSN, RNC-OB Diabetes Coordinator [phone] (8a-5p)

## 2017-05-30 NOTE — Progress Notes (Signed)
Inpatient Rehabilitation  Met with daughter at beside to further discuss case and plans for IP Rehab when medically stable.  Note hemoglobin trending down; discussed with bedside RN and nurse case manager.  Call if questions.  Carmelia Roller., CCC/SLP Admission Coordinator  [place]  Cell [phone]

## 2017-05-30 NOTE — Progress Notes (Signed)
Inpatient Rehabilitation  Received call from unit nurse case manager that patient had been discharged and she asked if we were admitting patient.  Notified IP Medical sales representative.  Dr. Posey Pronto cleared patient for IP Rehab admission with plan to address hemoglobin.  Called and confirmed IP Rehab admission with daughter and bedside RN.  Call if questions.   Carmelia Roller., CCC/SLP Admission Coordinator  [place]  Cell [phone]

## 2017-05-30 NOTE — IPOC Note (Addendum)
Overall Plan of Care [facility]) Patient Details Name: Maureen Morales MRN: [id number] DOB: 01-13-1923  Admitting Diagnosis: Right periprosthetic hip fracture  Hospital Problems: Active Problems:   Periprosthetic fracture around internal prosthetic right hip joint (HCC)   Intermittent asthma without complication   Stage 3 chronic kidney disease (HCC)   Chronic diastolic congestive heart failure (HCC)   Hypoalbuminemia due to protein-calorie malnutrition (HCC)     Functional Problem List: Nursing Medication Management, Pain  PT Balance, Edema, Endurance, Motor, Pain, Skin Integrity  OT Balance, Safety, Edema, Endurance, Pain, Motor  SLP    TR         Basic ADL's: OT Grooming, Bathing, Dressing, Toileting     Advanced  ADL's: OT Simple Meal Preparation     Transfers: PT Bed Mobility, Bed to Chair, Car, Manufacturing systems engineer, Metallurgist: PT Ambulation, Emergency planning/management officer, Stairs     Additional Impairments: OT None  SLP        TR      Anticipated Outcomes Item Anticipated Outcome  Self Feeding n/a  Swallowing      Basic self-care  S- min A  Toileting  min A   Bathroom Transfers min A  Bowel/Bladder  remain continent  Transfers  supervision to min assist  Locomotion  supervision w/c mobility; min assist short distance gait  Communication     Cognition     Pain  pain scale <4  Safety/Judgment  no falls with injury   Therapy Plan: PT Intensity: Minimum of 1-2 x/day ,45 to 90 minutes PT Frequency: 5 out of 7 days PT Duration Estimated Length of Stay: 14-17 days OT Intensity: Minimum of 1-2 x/day, 45 to 90 minutes OT Frequency: 5 out of 7 days OT Duration/Estimated Length of Stay: 14-17 days      Team Interventions: Nursing Interventions Medication Management, Skin Care/Wound Management, Pain Management  PT interventions Ambulation/gait training, Balance/vestibular training, Cognitive remediation/compensation, Discharge planning,  Community reintegration, Disease management/prevention, DME/adaptive equipment instruction, Functional mobility training, Neuromuscular re-education, Pain management, Patient/family education, Psychosocial support, Skin care/wound management, Splinting/orthotics, Therapeutic Activities, Therapeutic Exercise, Wheelchair propulsion/positioning, UE/LE Coordination activities, UE/LE Strength taining/ROM  OT Interventions Balance/vestibular training, Discharge planning, Functional electrical stimulation, Pain management, Self Care/advanced ADL retraining, Therapeutic Activities, UE/LE Coordination activities, Functional mobility training, Patient/family education, Therapeutic Exercise, Wheelchair propulsion/positioning, DME/adaptive equipment instruction, Psychosocial support  SLP Interventions    TR Interventions    SW/CM Interventions Discharge Planning, Psychosocial Support, Patient/Family Education   Barriers to Discharge MD  Medical stability and Weight bearing restrictions  Nursing      PT Medical stability, Weight bearing restrictions, Incontinence    OT Other (comments) none known at this time  SLP      SW       Team Discharge Planning: Destination: PT-Home ,OT- Home , SLP-  Projected Follow-up: PT-Home health PT, 24 hour supervision/assistance, OT-  24 hour supervision/assistance, Home health OT, SLP-  Projected Equipment Needs: PT-To be determined, OT- To be determined, SLP-  Equipment Details: PT-daughter and pt report they have access to all kinds of DME upon d/c. Pt used rollator PTA, OT-  Patient/family involved in discharge planning: PT- Patient, Family member/caregiver,  OT-Patient, SLP-   MD ELOS: 14-18 days. Medical Rehab Prognosis:  Good Assessment: 84 y.o. female with history of CAD, HTN, right hip hemiarthroplasty (still has hip precautions);  who was admitted on 05/26/17 after fall with periprosthetic femur fracture and scalp hematoma.  CT abdomen done in ED  due to  distension and questioned left rectal mass.  She was taken to OR for ORIF right femoral shaft with plate and screw. ABLA treated with transfusion but continues to decline with Hgb 7.0. She is to be NWB RLE for weeks to months per Dr. Ninfa Linden. She developed wheezing last night and CXR done was negative for acute changes. Patient with resulting functional deficits with mobility, transfers, self-care.  Will set goals for Min A for most tasks with PT/OT.  See Team Conference Notes for weekly updates to the plan of care

## 2017-05-30 NOTE — PMR Pre-admission (Signed)
PMR Admission Coordinator Pre-Admission Assessment  Patient: Maureen Morales is an 84 y.o., female MRN: [id number] DOB: [birth year] Height:  4'11" Weight:  126 lbs.              Insurance Information HMO:     PPO:      PCP:      IPA:      80/20:      OTHER:  PRIMARY: Medicare A & B      Policy#: [id number]      Subscriber: Self CM Name:       Phone#:      Fax#:  Pre-Cert#: Eligible       Employer: Retired  Benefits:  Phone #:      Name: Verified online via [employer] Portal  Eff. Date: 03/01/90     Deduct: $1364      Out of Pocket Max: N/A      Life Max: N/A CIR: 100%      SNF: 100% days 1-20; 8-% days 21-100 Outpatient: 80%     Co-Pay: 20% Home Health: 100%      Co-Pay: $0 DME: 80%     Co-Pay: 20% Providers: Patient's Choice   SECONDARY: BCBS Supplement       Policy#: [id number]      Subscriber: Self CM Name:       Phone#:      Fax#:  Pre-Cert#:       Employer: Retired  Benefits:  Phone #: [phone]     Name:  Eff. Date:      Deduct:       Out of Pocket Max:       Life Max:  CIR:       SNF:  Outpatient:      Co-Pay:  Home Health:       Co-Pay:  DME:      Co-Pay:   Medicaid Application Date:       Case Manager:  Disability Application Date:       Case Worker:   Emergency Contact Information Contact Information    Name Relation Home Work I[place], [place] Daughter   [phone]   Laneisha, Mino   [phone]     Current Medical History  Patient Admitting Diagnosis: Right periprosthetic hip fracture  History of Present Illness: Maureen Morales is a 84 y.o. female with history of CAD, HTN, right hip hemiarthroplasty (still has hip precautions);  who was admitted on 05/26/17 after fall with periprosthetic femur fracture and scalp hematoma.  She was taken to OR for ORIF right femoral shaft with plate and screw. ABLA treated with transfusion but continues to decline with Hgb 7.0. To be NWB RLE for weeks to months per Dr. Ninfa Linden. Therapy evaluations done today  revealing functional deficits and CIR recommended for follow up therapy. Patient admitted to Inpatient Rehabilitation 05/30/17.       Past Medical History  Past Medical History:  Diagnosis Date  . Asthma without status asthmaticus    unspecified  . CAD (coronary artery disease)   . Colitis    unspecified  . Complication of anesthesia    sensitive to anesthesia  . Depression   . Diabetes mellitus   . Diabetes mellitus type 2, uncomplicated ([place])   . Essential hypertension   . GERD (gastroesophageal reflux disease)   . 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 HLD (hyperlipidemia)   . Hyperlipidemia    unspecified  . Hypertension   .  Myocardial infarction ([place]) 2018  . Peripheral vascular disease ([place])   . Positive H. pylori test   . Viral gastroenteritis due to [place]-like agent Nov 2012    Family History  family history includes Breast cancer (age of onset: 57) in her sister; Breast cancer (age of onset: 67) in her mother; Cancer in her mother; Colon cancer in her brother, brother, and father; Stroke in her sister.  Prior Rehab/Hospitalizations:  Has the patient had major surgery during 100 days prior to admission? Yes  Current Medications   Current Facility-Administered Medications:  .  0.9 %  sodium chloride infusion, , Intravenous, Continuous, Virgel Manifold, MD, Last Rate: 75 mL/hr at 05/29/17 1738, 75 mL/hr at 05/29/17 1738 .  0.9 %  sodium chloride infusion, , Intravenous, Once, Dana Allan I, MD .  acetaminophen (TYLENOL) tablet 650 mg, 650 mg, Oral, Q6H PRN, 650 mg at 05/30/17 0627 **OR** acetaminophen (TYLENOL) suppository 650 mg, 650 mg, Rectal, Q6H PRN, Mcarthur Rossetti, MD .  atorvastatin (LIPITOR) tablet 40 mg, 40 mg, Oral, Daily, Ivor Costa, MD, 40 mg at 05/30/17 1030 .  bacitracin ointment, , Topical, BID, O[place], Sylvester I, MD .  fenofibrate tablet 160 mg, 160 mg, Oral, Daily, Ivor Costa, MD, 160 mg at 05/30/17 1029 .  hydrALAZINE  (APRESOLINE) injection 5 mg, 5 mg, Intravenous, Q2H PRN, Ivor Costa, MD .  HYDROcodone-acetaminophen (NORCO/VICODIN) 5-325 MG per tablet 1-2 tablet, 1-2 tablet, Oral, Q6H PRN, Mcarthur Rossetti, MD, 1 tablet at 05/29/17 0735 .  insulin aspart (novoLOG) injection 0-5 Units, 0-5 Units, Subcutaneous, QHS, Dana Allan I, MD, 2 Units at 05/28/17 2104 .  insulin aspart (novoLOG) injection 0-9 Units, 0-9 Units, Subcutaneous, TID WC, Dana Allan I, MD, 1 Units at 05/30/17 [phone] .  insulin glargine (LANTUS) injection 10 Units, 10 Units, Subcutaneous, Daily, Dana Allan I, MD, 10 Units at 05/30/17 1000 .  loratadine (CLARITIN) tablet 10 mg, 10 mg, Oral, Daily, Ivor Costa, MD, 10 mg at 05/30/17 1030 .  losartan (COZAAR) tablet 100 mg, 100 mg, Oral, Daily, Ivor Costa, MD, 100 mg at 05/30/17 1030 .  menthol-cetylpyridinium (CEPACOL) lozenge 3 mg, 1 lozenge, Oral, PRN **OR** phenol (CHLORASEPTIC) mouth spray 1 spray, 1 spray, Mouth/Throat, PRN, Mcarthur Rossetti, MD .  methocarbamol (ROBAXIN) tablet 500 mg, 500 mg, Oral, Q6H PRN, 500 mg at 05/29/17 0738 **OR** methocarbamol (ROBAXIN) 500 mg in dextrose 5 % 50 mL IVPB, 500 mg, Intravenous, Q6H PRN, Mcarthur Rossetti, MD, Stopped at 05/27/17 1201 .  methocarbamol (ROBAXIN) tablet 500 mg, 500 mg, Oral, Q8H PRN, Ivor Costa, MD, 500 mg at 05/27/17 0212 .  metoCLOPramide (REGLAN) tablet 5-10 mg, 5-10 mg, Oral, Q8H PRN **OR** metoCLOPramide (REGLAN) injection 5-10 mg, 5-10 mg, Intravenous, Q8H PRN, Mcarthur Rossetti, MD .  metoprolol succinate (TOPROL-XL) 24 hr tablet 50 mg, 50 mg, Oral, Daily, Ivor Costa, MD, 50 mg at 05/30/17 1030 .  morphine 2 MG/ML injection 0.5 mg, 0.5 mg, Intravenous, Q2H PRN, Mcarthur Rossetti, MD, 0.5 mg at 05/28/17 2351 .  ondansetron (ZOFRAN) tablet 4 mg, 4 mg, Oral, Q6H PRN **OR** ondansetron (ZOFRAN) injection 4 mg, 4 mg, Intravenous, Q6H PRN, Mcarthur Rossetti, MD .  oxyCODONE-acetaminophen  (PERCOCET/ROXICET) 5-325 MG per tablet 1 tablet, 1 tablet, Oral, Q4H PRN, Ivor Costa, MD, 1 tablet at 05/27/17 2058 .  polyethylene glycol (MIRALAX / GLYCOLAX) packet 17 g, 17 g, Oral, Daily PRN, Ivor Costa, MD, 17 g at 05/29/17 1149 .  sertraline (ZOLOFT) tablet 50 mg,  50 mg, Oral, Daily, Ivor Costa, MD, 50 mg at 05/30/17 1030 .  vitamin C (ASCORBIC ACID) tablet 500 mg, 500 mg, Oral, BID, Ivor Costa, MD, 500 mg at 05/30/17 1029 .  zolpidem (AMBIEN) tablet 5 mg, 5 mg, Oral, QHS PRN, Ivor Costa, MD  Patients Current Diet: Diet Carb Modified Fluid consistency: Thin; Room service appropriate? Yes Diet - low sodium heart healthy Diet Carb Modified  Precautions / Restrictions Precautions Precautions: Fall Restrictions Weight Bearing Restrictions: Yes RLE Weight Bearing: Non weight bearing   Has the patient had 2 or more falls or a fall with injury in the past year?Yes  Prior Activity Level Community (5-7x/wk): Prior to 3 monhts ago patient was fully independent and driving; however, after her inital hip fracture she required assist with bathing and dressing.  She enjoys cooking and desires to be as independent as possible.    Home Assistive Devices / Equipment Home Assistive Devices/Equipment: Gilford Rile (specify type) Home Equipment: Walker - 2 wheels, Toilet riser  Prior Device Use: Indicate devices/aids used by the patient prior to current illness, exacerbation or injury? Walker  Prior Functional Level Prior Function Level of Independence: Needs assistance Gait / Transfers Assistance Needed: using RW for ambulation ADL's / Homemaking Assistance Needed: Pt receiving assist from personal care aide who were supervising/assisting with bathing and LB dressing  Self Care: Did the patient need help bathing, dressing, using the toilet or eating? Needed some help  Indoor Mobility: Did the patient need assistance with walking from room to room (with or without device)? Independent  Stairs: Did  the patient need assistance with internal or external stairs (with or without device)? Independent  Functional Cognition: Did the patient need help planning regular tasks such as shopping or remembering to take medications? Needed some help  Current Functional Level Cognition  Overall Cognitive Status: Impaired/Different from baseline Current Attention Level: Sustained Orientation Level: Oriented X4 Following Commands: Follows one step commands consistently, Follows one step commands with increased time, Follows multi-step commands inconsistently Safety/Judgement: Decreased awareness of safety, Decreased awareness of deficits General Comments: Pt requiring increased cues for weight bearing status and repetition of instruction    Extremity Assessment (includes Sensation/Coordination)  Upper Extremity Assessment: Generalized weakness  Lower Extremity Assessment: Defer to PT evaluation RLE Deficits / Details: Decreased strength and AROM consistent with above mentioned procedure.     ADLs  Overall ADL's : Needs assistance/impaired Eating/Feeding: Set up, Sitting Grooming: Set up, Sitting, Wash/dry face Upper Body Bathing: Minimal assistance, Sitting Lower Body Bathing: Moderate assistance, +2 for physical assistance, +2 for safety/equipment, Sit to/from stand Upper Body Dressing : Minimal assistance, Sitting Lower Body Dressing: +2 for physical assistance, +2 for safety/equipment, Maximal assistance, Sit to/from stand Toilet Transfer: +2 for safety/equipment, +2 for physical assistance, Stand-pivot, BSC, RW, Maximal assistance Toilet Transfer Details (indicate cue type and reason): simulated in transfer EOB>recliner  Toileting- Clothing Manipulation and Hygiene: Maximal assistance, +2 for physical assistance, +2 for safety/equipment, Sit to/from stand Functional mobility during ADLs: Maximal assistance, +2 for physical assistance, +2 for safety/equipment, Rolling walker    Mobility   Overal bed mobility: Needs Assistance Bed Mobility: Supine to Sit Supine to sit: Mod assist, +2 for physical assistance, HOB elevated General bed mobility comments: VC's for use of rails for support. Assist for LE advancement towards EOB and scooting assist provided with bed pad. Pt was able to scoot herself out of EOB once in an upright seated position.     Transfers  Overall transfer level:  Needs assistance Equipment used: Rolling walker (2 wheeled) Transfers: Sit to/from Stand, Stand Pivot Transfers Sit to Stand: Min assist, +2 physical assistance, From elevated surface Stand pivot transfers: Mod assist, +2 physical assistance General transfer comment: Transferred to the L this session, and pt was able to scoot L foot around towards chair with increased initiation. VC's for maintenance of WB status - therapist kept foot under pt's R foot to ensure she was maintaining NWB.     Ambulation / Gait / Stairs / Wheelchair Mobility  Ambulation/Gait General Gait Details: Unable    Posture / Balance Balance Overall balance assessment: Needs assistance Sitting-balance support: Feet supported, No upper extremity supported Sitting balance-Leahy Scale: Fair Standing balance support: Bilateral upper extremity supported, During functional activity Standing balance-Leahy Scale: Poor Standing balance comment: Reliant on UE support on walker.     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 05/14/17 Bowel mgmt: Continent, last BM 05/30/17 Bladder mgmt: Incontinent, has also been using the bed pan Diabetic mgmt: Yes     Previous Home Environment Living Arrangements: Spouse/significant other Available Help at Discharge: Family,  Personal care attendant [place]: Yes Type of Home Care Services: [place] (if known): private pay  Discharge Living Setting Plans for Discharge Living Setting: Other (Comment)(SNF-[facility] ) Type of Home at Discharge: [place] Name at Discharge: [facility] private room, patient/family request  Does the patient have any problems obtaining your medications?: No  Social/Family/Support Systems Patient Roles: Spouse, Parent Contact Information: Daughter Lynnita Somma  Anticipated Caregiver: SNF, but privte duty caregiver as well Anticipated Ambulance person Information: Cell:[phone] Ability/Limitations of Caregiver: Daughter works but patient has had a private duty caregiver for 35 years  Discharge Plan Discussed with Primary Caregiver: Yes Is Caregiver In Agreement with Plan?: Yes Does Caregiver/Family have Issues with Lodging/Transportation while Pt is in Rehab?: No  Goals/Additional Needs Patient/Family Goal for Rehab: PT/OT: Mod A Expected length of stay: 15-19 days to decrease burden of care Dietary Needs: Carb. Mod. diet restrictions  Equipment Needs: TBD Pt/Family Agrees to Admission and willing to participate: Yes Program Orientation Provided & Reviewed with Pt/Caregiver Including Roles  & Responsibilities: Yes  Barriers to Discharge: Medical stability, Other (comments)(2 person transfer assist )  Decrease burden of Care through IP rehab admission: Decrease number of caregivers  Possible need for SNF placement upon discharge: Yes and patient and family have requested to return to [facility] place (private room)   Patient Condition: This patient's medical and functional status has changed since the consult dated: 05/28/17 in which the Rehabilitation Physician determined and documented that the patient's condition is appropriate for intensive rehabilitative care in an inpatient rehabilitation facility. See "History of  Present Illness" (above) for medical update. Functional changes are: Min-Mod A +2. Patient's medical and functional status update has been discussed with the Rehabilitation physician and patient remains appropriate for inpatient rehabilitation. Will admit to inpatient rehab today.  Preadmission Screen Completed By:  Gunnar Fusi, 05/30/2017 4:45 PM ______________________________________________________________________   Discussed status with Dr. Posey Pronto on 05/30/17 at 1655 and received telephone approval for admission today.  Admission Coordinator:  Gunnar Fusi, time 1655/Date 05/30/17

## 2017-05-30 NOTE — H&P (Addendum)
Physical Medicine and Rehabilitation Admission H&P    Chief Complaint  Patient presents with  . Functional deficits due to periprosthetic fracture  . Hip Pain    HPI: Maureen Morales is a 84 y.o. female with history of CAD, HTN, right hip hemiarthroplasty (still has hip precautions);  who was admitted on 05/26/17 after fall with periprosthetic femur fracture and scalp hematoma.  History taken from chart review, family, patient, and care giver. CT abdomen done in ED due to distension and questioned left rectal mass.  She was taken to OR for ORIF right femoral shaft with plate and screw. ABLA treated with transfusion but continues to decline with Hgb 7.0, plan for 2 more units per primary team. She is to be NWB RLE for weeks to months per Dr. Ninfa Linden. She developed wheezing last night and CXR done today was negative for acute changes. Therapy ongoing and patient with functional decline. CIR recommended for follow up therapy. Discussed case with inpatient rehab director. Discussed with attending physician and plan for transfusion due to Hemoglobin trending down. Attempted to contact Ortho due to concern for hematoma, continue to await call back.    Review of Systems  HENT: Positive for hearing loss (left ear).   Eyes: Negative for blurred vision and double vision.  Respiratory: Positive for wheezing (last night). Negative for cough. Shortness of breath: t.   Cardiovascular: Negative for chest pain and palpitations.  Gastrointestinal: Negative for constipation, heartburn and nausea.  Genitourinary: Positive for frequency. Negative for dysuria.  Musculoskeletal: Positive for joint pain (right hip pain. ) and myalgias.       Severe right heel pain.   Neurological: Negative for dizziness and headaches.  Psychiatric/Behavioral: The patient is nervous/anxious. The patient does not have insomnia.   All other systems reviewed and are negative.    Past Medical History:  Diagnosis Date  . Asthma  without status asthmaticus    unspecified  . CAD (coronary artery disease)   . Colitis    unspecified  . Complication of anesthesia    sensitive to anesthesia  . Depression   . Diabetes mellitus   . Diabetes mellitus type 2, uncomplicated ([place])   . Essential hypertension   . GERD (gastroesophageal reflux disease)   . 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 HLD (hyperlipidemia)   . Hyperlipidemia    unspecified  . Hypertension   . Myocardial infarction ([place]) 2018  . Peripheral vascular disease ([place])   . Positive H. pylori test   . Viral gastroenteritis due to [place]-like agent Nov 2012    Past Surgical History:  Procedure Laterality Date  . ABDOMINAL HYSTERECTOMY    . BLADDER SURGERY    . CARDIAC CATHETERIZATION    . CHOLECYSTECTOMY    . COLONOSCOPY     06/16/1987, 02/20/1995, 05/04/1999, 02/14/2005, 05/17/2007  . ESOPHAGOGASTRODUODENOSCOPY     05/11/1987, 06/13/1995, 10/09/1995, 05/04/1999, 01/16/2002  . FLEXIBLE SIGMOIDOSCOPY N/A 05/02/2017   Procedure: FLEXIBLE SIGMOIDOSCOPY;  Surgeon: Manya Silvas, MD;  Location[facility] ENDOSCOPY;  Service: Endoscopy;  Laterality: N/A;  . HEMORRHOID SURGERY N/A 05/14/2017   Procedure: EXTERNAL THROMBOSED HEMORRHOIDECTOMY;  Surgeon: Robert Bellow, MD;  Location: [facility] ORS;  Service: General;  Laterality: N/A;  . HIP ARTHROPLASTY Right 02/17/2017   Procedure: ARTHROPLASTY BIPOLAR HIP (HEMIARTHROPLASTY);  Surgeon: Claud Kelp, MD;  Location: [facility] ORS;  Service: Orthopedics;  Laterality: Right;  . LEFT HEART CATH AND CORONARY ANGIOGRAPHY N/A 08/31/2016   Procedure:  Left Heart Cath and Coronary Angiography;  Surgeon: Wellington Hampshire, MD;  Location: [facility] CV LAB;  Service: Cardiovascular;  Laterality: N/A;  . ORIF PERIPROSTHETIC FRACTURE Right 05/27/2017  . ORIF PERIPROSTHETIC FRACTURE Right 05/27/2017   Procedure: OPEN REDUCTION INTERNAL FIXATION (ORIF) PERIPROSTHETIC FRACTURE;  Surgeon: Mcarthur Rossetti, MD;   Location: [facility];  Service: Orthopedics;  Laterality: Right;  . TOTAL HIP ARTHROPLASTY Right   . uterian prolapse      Family History  Problem Relation Age of Onset  . Breast cancer Mother 32  . Colon cancer Father   . Stroke Sister   . Cancer Mother   . Breast cancer Sister 41  . Colon cancer Brother   . Colon cancer Brother     Social History:  Married.  Lives at home with husband who is 100. They have each have an aide. She was independent with walker and supervision. Needed  She r ADLsreports that  has never smoked. She does not have any smokeless tobacco history on file. She reports that she does not drink alcohol or use drugs.    Allergies  Allergen Reactions  . Asa [Aspirin] Anaphylaxis  . Aspirin Swelling  . Atropine Other (See Comments)    Reaction: unknown  . Belladonna Alkaloids Other (See Comments)    Reaction: unknown  . Codeine Other (See Comments)    Reaction: unknown  . Darvon Other (See Comments)    Reaction: unknown  . Darvon [Propoxyphene] Nausea And Vomiting  . Demerol [Meperidine] Nausea And Vomiting  . Ferrous Sulfate Diarrhea  . Meperidine And Related   . Methocarbamol Other (See Comments)    Reaction: unknown  . Penicillins Other (See Comments)    Reaction: unknown Has patient had a PCN reaction causing immediate rash, facial/tongue/throat swelling, SOB or lightheadedness with hypotension: Unknown Has patient had a PCN reaction causing severe rash involving mucus membranes or skin necrosis: Unknown Has patient had a PCN reaction that required hospitalization: Unknown Has patient had a PCN reaction occurring within the last 10 years: Unknown If all of the above answers are "NO", then may proceed with Cephalosporin use.   [name] Penicillins Other (See Comments)    Pt and family unsure of details  . Sulfa Antibiotics Other (See Comments)    Reaction: unknown  . Iron Other (See Comments)    High dose prescription gives her diarrhea    Medications  Prior to Admission  Medication Sig Dispense Refill  . atorvastatin (LIPITOR) 40 MG tablet Take 40 mg by mouth daily.    . fenofibrate 160 MG tablet Take 160 mg by mouth daily.    [name] loratadine (CLARITIN) 10 MG tablet Take 10 mg by mouth daily.    [name] losartan (COZAAR) 100 MG tablet Take 100 mg by mouth daily.    . metoprolol succinate (TOPROL-XL) 50 MG 24 hr tablet Take 50 mg by mouth daily. Take with or immediately following a meal.    . sertraline (ZOLOFT) 50 MG tablet Take 50 mg by mouth daily.    . vitamin C (ASCORBIC ACID) 500 MG tablet Take 500 mg by mouth 2 (two) times daily.    [name] loratadine (CLARITIN) 10 MG tablet Take 10 mg daily by mouth.     . nitroGLYCERIN (NITROSTAT) 0.4 MG SL tablet Place 1 tablet (0.4 mg total) under the tongue every 5 (five) minutes as needed for chest pain. (Patient not taking: Reported on 05/11/2017) 10 tablet 0  . vitamin C (ASCORBIC ACID) 500  MG tablet Take 1 tablet (500 mg total) by mouth 2 (two) times daily. 60 tablet 2    Drug Regimen Review  Drug regimen was reviewed and remains appropriate with no significant issues identified  Home: Home Living Family/patient expects to be discharged to:: Private residence Living Arrangements: Spouse/significant other Available Help at Discharge: Family, Personal care attendant Home Equipment: Environmental consultant - 2 wheels, Toilet riser   Functional History: Prior Function Level of Independence: Needs assistance Gait / Transfers Assistance Needed: using RW for ambulation ADL's / Homemaking Assistance Needed: Pt receiving assist from personal care aide who were supervising/assisting with bathing and LB dressing  Functional Status:  Mobility: Bed Mobility Overal bed mobility: Needs Assistance Bed Mobility: Supine to Sit Supine to sit: Mod assist, +2 for physical assistance, HOB elevated General bed mobility comments: VC's for use of rails for support. Assist for LE advancement towards EOB and scooting assist provided with  bed pad. Pt was able to scoot herself out of EOB once in an upright seated position.  Transfers Overall transfer level: Needs assistance Equipment used: Rolling walker (2 wheeled) Transfers: Sit to/from Stand, Stand Pivot Transfers Sit to Stand: Min assist, +2 physical assistance, From elevated surface Stand pivot transfers: Mod assist, +2 physical assistance General transfer comment: Transferred to the L this session, and pt was able to scoot L foot around towards chair with increased initiation. VC's for maintenance of WB status - therapist kept foot under pt's R foot to ensure she was maintaining NWB.  Ambulation/Gait General Gait Details: Unable    ADL: ADL Overall ADL's : Needs assistance/impaired Eating/Feeding: Set up, Sitting Grooming: Set up, Sitting, Wash/dry face Upper Body Bathing: Minimal assistance, Sitting Lower Body Bathing: Moderate assistance, +2 for physical assistance, +2 for safety/equipment, Sit to/from stand Upper Body Dressing : Minimal assistance, Sitting Lower Body Dressing: +2 for physical assistance, +2 for safety/equipment, Maximal assistance, Sit to/from stand Toilet Transfer: +2 for safety/equipment, +2 for physical assistance, Stand-pivot, BSC, RW, Maximal assistance Toilet Transfer Details (indicate cue type and reason): simulated in transfer EOB>recliner  Toileting- Clothing Manipulation and Hygiene: Maximal assistance, +2 for physical assistance, +2 for safety/equipment, Sit to/from stand Functional mobility during ADLs: Maximal assistance, +2 for physical assistance, +2 for safety/equipment, Rolling walker  Cognition: Cognition Overall Cognitive Status: Impaired/Different from baseline Orientation Level: Oriented X4 Cognition Arousal/Alertness: Awake/alert Behavior During Therapy: Flat affect Overall Cognitive Status: Impaired/Different from baseline Area of Impairment: Attention, Memory, Awareness, Problem solving, Safety/judgement Current  Attention Level: Sustained Memory: Decreased recall of precautions, Decreased short-term memory Following Commands: Follows one step commands consistently, Follows one step commands with increased time, Follows multi-step commands inconsistently Safety/Judgement: Decreased awareness of safety, Decreased awareness of deficits Awareness: Intellectual Problem Solving: Slow processing, Decreased initiation, Difficulty sequencing, Requires verbal cues General Comments: Pt requiring increased cues for weight bearing status and repetition of instruction    Blood pressure (!) 145/48, pulse 84, temperature (!) 97.3 F (36.3 C), temperature source Oral, resp. rate 17, SpO2 98 %. Physical Exam  Nursing note and vitals reviewed. Constitutional: She is oriented to person, place, and time. She appears well-developed and well-nourished. No distress.  HENT:  Head: Normocephalic and atraumatic.  Mouth/Throat: Oropharynx is clear and moist.  Eyes: Conjunctivae and EOM are normal. Pupils are equal, round, and reactive to light.  Neck: Normal range of motion. Neck supple.  Cardiovascular: Normal rate and regular rhythm.  Murmur heard. Respiratory: Effort normal. No stridor. She has wheezes (audible upper airway sounds). She exhibits no tenderness.  GI: Soft. Bowel sounds are normal. She exhibits no distension. There is no tenderness.  Musculoskeletal: She exhibits edema.  Moderate edema right hip and thigh with fluctuance and resolving ecchymosis. 1+ edema left foot.   Neurological: She is alert and oriented to person, place, and time.  Motor: B/l UE: 4/5 proximal to distal RLE: HF, KE 1/5, ADF/PF 3+/5 LLE: HF, KE 3+/5, ADF/PF 4/5  Skin: Skin is warm and dry. She is not diaphoretic.  Dry surgical dressing in place. 1+ pitting edema RLE to foot. Right heel with 3 cm X 4 cm fluctuant blister.   Psychiatric: She has a normal mood and affect. Her behavior is normal.    Results for orders placed or  performed during the hospital encounter of 05/26/17 (from the past 48 hour(s))  Glucose, capillary     Status: Abnormal   Collection Time: 05/28/17  8:48 PM  Result Value Ref Range   Glucose-Capillary 239 (H) 65 - 99 mg/dL  Influenza panel by PCR (type A & B)     Status: None   Collection Time: 05/28/17 10:55 PM  Result Value Ref Range   Influenza A By PCR NEGATIVE NEGATIVE   Influenza B By PCR NEGATIVE NEGATIVE    Comment: (NOTE) The Xpert Xpress Flu assay is intended as an aid in the diagnosis of  influenza and should not be used as a sole basis for treatment.  This  assay is FDA approved for nasopharyngeal swab specimens only. Nasal  washings and aspirates are unacceptable for Xpert Xpress Flu testing.   Glucose, capillary     Status: Abnormal   Collection Time: 05/29/17  1:43 AM  Result Value Ref Range   Glucose-Capillary 164 (H) 65 - 99 mg/dL  CBC     Status: Abnormal   Collection Time: 05/29/17  5:41 AM  Result Value Ref Range   WBC 10.6 (H) 4.0 - 10.5 K/uL   RBC 2.70 (L) 3.87 - 5.11 MIL/uL   Hemoglobin 7.5 (L) 12.0 - 15.0 g/dL   HCT 22.9 (L) 36.0 - 46.0 %   MCV 84.8 78.0 - 100.0 fL   MCH 27.8 26.0 - 34.0 pg   MCHC 32.8 30.0 - 36.0 g/dL   RDW 15.3 11.5 - 15.5 %   Platelets 203 150 - 400 K/uL  Basic metabolic panel     Status: Abnormal   Collection Time: 05/29/17  5:41 AM  Result Value Ref Range   Sodium 137 135 - 145 mmol/L   Potassium 3.7 3.5 - 5.1 mmol/L   Chloride 110 101 - 111 mmol/L   CO2 20 (L) 22 - 32 mmol/L   Glucose, Bld 185 (H) 65 - 99 mg/dL   BUN 30 (H) 6 - 20 mg/dL   Creatinine, Ser 1.25 (H) 0.44 - 1.00 mg/dL   Calcium 8.2 (L) 8.9 - 10.3 mg/dL   GFR calc non Af Amer 36 (L) >60 mL/min   GFR calc Af Amer 42 (L) >60 mL/min    Comment: (NOTE) The eGFR has been calculated using the CKD EPI equation. This calculation has not been validated in all clinical situations. eGFR's persistently <60 mL/min signify possible Chronic Kidney Disease.    Anion gap 7 5  - 15  Glucose, capillary     Status: Abnormal   Collection Time: 05/29/17  6:29 AM  Result Value Ref Range   Glucose-Capillary 155 (H) 65 - 99 mg/dL  Glucose, capillary     Status: Abnormal   Collection Time: 05/29/17 11:41  AM  Result Value Ref Range   Glucose-Capillary 188 (H) 65 - 99 mg/dL  Glucose, capillary     Status: Abnormal   Collection Time: 05/29/17  4:24 PM  Result Value Ref Range   Glucose-Capillary 208 (H) 65 - 99 mg/dL  Glucose, capillary     Status: Abnormal   Collection Time: 05/29/17 10:14 PM  Result Value Ref Range   Glucose-Capillary 178 (H) 65 - 99 mg/dL   Comment 1 Notify RN    Comment 2 Document in Chart   CBC     Status: Abnormal   Collection Time: 05/30/17  5:31 AM  Result Value Ref Range   WBC 9.4 4.0 - 10.5 K/uL   RBC 2.46 (L) 3.87 - 5.11 MIL/uL   Hemoglobin 7.0 (L) 12.0 - 15.0 g/dL   HCT 20.8 (L) 36.0 - 46.0 %   MCV 84.6 78.0 - 100.0 fL   MCH 28.5 26.0 - 34.0 pg   MCHC 33.7 30.0 - 36.0 g/dL   RDW 15.2 11.5 - 15.5 %   Platelets 229 150 - 400 K/uL  Glucose, capillary     Status: Abnormal   Collection Time: 05/30/17  6:31 AM  Result Value Ref Range   Glucose-Capillary 149 (H) 65 - 99 mg/dL  Glucose, capillary     Status: Abnormal   Collection Time: 05/30/17 12:26 PM  Result Value Ref Range   Glucose-Capillary 238 (H) 65 - 99 mg/dL   Dg Chest Port 1 View  Result Date: 05/30/2017 CLINICAL DATA:  Fever EXAM: PORTABLE CHEST 1 VIEW COMPARISON:  None. FINDINGS: 1300 hours. Lungs are hyperexpanded. The lungs are clear without focal pneumonia, edema, pneumothorax or pleural effusion. Interstitial markings are diffusely coarsened with chronic features. The cardio pericardial silhouette is enlarged. The visualized bony structures of the thorax are intact. IMPRESSION: Emphysema with chronic interstitial coarsening. No acute cardiopulmonary findings. Electronically Signed   By: Misty Stanley M.D.   On: 05/30/2017 13:38       Medical Problem List and  Plan: 1.  Deficits with mobility, transfers, and self-care secondary to right peri-prosthetic hip fracture.  2.  DVT Prophylaxis/Anticoagulation: Mechanical: Sequential compression devices, below knee Bilateral lower extremities--no blood thinners due to history of hematochezia.  3. Pain Management: oxycodone prn 4. Mood: team to provide ego support to help manage pain and anxiety 5. Neuropsych: This patient is capable of making decisions on her own behalf. 6. Skin/Wound Care: Monitor scalp wound, anal wound and right hip incision for healing.  7. Fluids/Electrolytes/Nutrition: Monitor I/O. Check weights daily and monitor for signs of overload. 8. ABLA: Has been T&C--awaiting transfusion. Premedicate prior to transfusion--monitor of reactions. Recheck CBC in am. 9. HTN: Monitor BP bid--continue Toprol XL and Cozaar. 10. T2DM: Hgb A1C-7.0. BS poorly controlled and was started on Lantus. Continue for now and transition to oral medication as mobility improves.  11. H/o Asthma: Wheezing continues.  Will schedule nebs qid for now. 12. Right heel ulcer: Order prevalon boot. Foam dressing. Add protein supplement to help with wound healing.  13. Recent Anal fistulotomy: Keep area clean and dry.  14. Chronic renal failure?: Baseline SCr around 1.2? Will continue to monitor with serial checks.  15. Right hemiarthroplasty: She is to continue right total hip precautions as well as  NWB due to recent surgery. .  16. CAD with moderate aortic stenosis/Grade 2 diastolic dysfunction: Monitor daily weights. On lipitor, Cozaar and Toprol. Avoid overload.    Post Admission Physician Evaluation: 1. Preadmission assessment reviewed  and changes made below. 2. Functional deficits secondary  to right peri-prosthetic hip fracture. 3. Patient is admitted to receive collaborative, interdisciplinary care between the physiatrist, rehab nursing staff, and therapy team. 4. Patient's level of medical complexity and  substantial therapy needs in context of that medical necessity cannot be provided at a lesser intensity of care such as a SNF. 5. Patient has experienced substantial functional loss from his/her baseline which was documented above under the "Functional History" and "Functional Status" headings.  Judging by the patient's diagnosis, physical exam, and functional history, the patient has potential for functional progress which will result in measurable gains while on inpatient rehab.  These gains will be of substantial and practical use upon discharge  in facilitating mobility and self-care at the household level. 66. Physiatrist will provide 24 hour management of medical needs as well as oversight of the therapy plan/treatment and provide guidance as appropriate regarding the interaction of the two. 7. 24 hour rehab nursing will assist with bladder management, safety, skin/wound care, disease management, pain management and patient education  and help integrate therapy concepts, techniques,education, etc. 8. PT will assess and treat for/with: Lower extremity strength, range of motion, stamina, balance, functional mobility, safety, adaptive techniques and equipment, woundcare, coping skills, pain control, education.   Goals are: Min/Mod A. 9. OT will assess and treat for/with: ADL's, functional mobility, safety, upper extremity strength, adaptive techniques and equipment, wound mgt, ego support, and community reintegration.   Goals are: Min/Mod A. Therapy may not proceed with showering this patient. 10. Case Management and Social Worker will assess and treat for psychological issues and discharge planning. 11. Team conference will be held weekly to assess progress toward goals and to determine barriers to discharge. 12. Patient will receive at least 3 hours of therapy per day at least 5 days per week. 13. ELOS: 18-22 days.       14. Prognosis:  good  Delice Lesch, MD, ABPMR Bary Leriche,  PA-C 05/30/2017

## 2017-05-31 ENCOUNTER — Other Ambulatory Visit: Payer: Self-pay

## 2017-05-31 ENCOUNTER — Inpatient Hospital Stay (HOSPITAL_COMMUNITY): Payer: Medicare Other

## 2017-05-31 ENCOUNTER — Inpatient Hospital Stay (HOSPITAL_COMMUNITY): Payer: Medicare Other | Admitting: Occupational Therapy

## 2017-05-31 DIAGNOSIS — J4520 Mild intermittent asthma, uncomplicated: Secondary | ICD-10-CM

## 2017-05-31 DIAGNOSIS — M9701XS Periprosthetic fracture around internal prosthetic right hip joint, sequela: Secondary | ICD-10-CM

## 2017-05-31 DIAGNOSIS — N183 Chronic kidney disease, stage 3 unspecified: Secondary | ICD-10-CM

## 2017-05-31 DIAGNOSIS — I10 Essential (primary) hypertension: Secondary | ICD-10-CM

## 2017-05-31 DIAGNOSIS — I2510 Atherosclerotic heart disease of native coronary artery without angina pectoris: Secondary | ICD-10-CM

## 2017-05-31 DIAGNOSIS — N1832 Chronic kidney disease, stage 3b: Secondary | ICD-10-CM

## 2017-05-31 DIAGNOSIS — I5032 Chronic diastolic (congestive) heart failure: Secondary | ICD-10-CM

## 2017-05-31 DIAGNOSIS — E119 Type 2 diabetes mellitus without complications: Secondary | ICD-10-CM

## 2017-05-31 DIAGNOSIS — D62 Acute posthemorrhagic anemia: Secondary | ICD-10-CM

## 2017-05-31 DIAGNOSIS — R609 Edema, unspecified: Secondary | ICD-10-CM

## 2017-05-31 LAB — CBC WITH DIFFERENTIAL/PLATELET
Basophils Absolute: 0 10*3/uL (ref 0.0–0.1)
Basophils Relative: 0 %
EOS PCT: 3 %
Eosinophils Absolute: 0.2 10*3/uL (ref 0.0–0.7)
HCT: 31.3 % — ABNORMAL LOW (ref 36.0–46.0)
Hemoglobin: 10.5 g/dL — ABNORMAL LOW (ref 12.0–15.0)
LYMPHS ABS: 0.9 10*3/uL (ref 0.7–4.0)
LYMPHS PCT: 12 %
MCH: 28.2 pg (ref 26.0–34.0)
MCHC: 33.5 g/dL (ref 30.0–36.0)
MCV: 84.1 fL (ref 78.0–100.0)
MONOS PCT: 9 %
Monocytes Absolute: 0.7 10*3/uL (ref 0.1–1.0)
Neutro Abs: 6.1 10*3/uL (ref 1.7–7.7)
Neutrophils Relative %: 76 %
PLATELETS: 273 10*3/uL (ref 150–400)
RBC: 3.72 MIL/uL — ABNORMAL LOW (ref 3.87–5.11)
RDW: 14.5 % (ref 11.5–15.5)
WBC: 8 10*3/uL (ref 4.0–10.5)

## 2017-05-31 LAB — COMPREHENSIVE METABOLIC PANEL
ALT: 21 U/L (ref 14–54)
AST: 27 U/L (ref 15–41)
Albumin: 2.5 g/dL — ABNORMAL LOW (ref 3.5–5.0)
Alkaline Phosphatase: 44 U/L (ref 38–126)
Anion gap: 11 (ref 5–15)
BUN: 27 mg/dL — ABNORMAL HIGH (ref 6–20)
CHLORIDE: 104 mmol/L (ref 101–111)
CO2: 23 mmol/L (ref 22–32)
CREATININE: 1.18 mg/dL — AB (ref 0.44–1.00)
Calcium: 8.8 mg/dL — ABNORMAL LOW (ref 8.9–10.3)
GFR, EST AFRICAN AMERICAN: 45 mL/min — AB (ref >60)
GFR, EST NON AFRICAN AMERICAN: 39 mL/min — AB (ref >60)
Glucose, Bld: 270 mg/dL — ABNORMAL HIGH (ref 65–99)
Potassium: 3.7 mmol/L (ref 3.5–5.1)
Sodium: 138 mmol/L (ref 135–145)
Total Bilirubin: 1.4 mg/dL — ABNORMAL HIGH (ref 0.3–1.2)
Total Protein: 5.4 g/dL — ABNORMAL LOW (ref 6.5–8.1)

## 2017-05-31 LAB — GLUCOSE, CAPILLARY
GLUCOSE-CAPILLARY: 144 mg/dL — AB (ref 65–99)
GLUCOSE-CAPILLARY: 203 mg/dL — AB (ref 65–99)
Glucose-Capillary: 239 mg/dL — ABNORMAL HIGH (ref 65–99)
Glucose-Capillary: 206 mg/dL — ABNORMAL HIGH (ref 65–99)

## 2017-05-31 MED ORDER — LEVALBUTEROL HCL 0.63 MG/3ML IN NEBU: 0.6300 mg | INHALATION_SOLUTION | Freq: Four times a day (QID) | RESPIRATORY_TRACT | Status: DC | PRN

## 2017-05-31 NOTE — [facility] Notes (Signed)
PMR Admission Coordinator Pre-Admission Assessment  Patient: Maureen Morales is an 84 y.o., female MRN: [id number] DOB: 1923/01/09 Height:  4'11" Weight:  126 lbs.                                                                                                                                                  Insurance Information HMO:     PPO:      PCP:      IPA:      80/20:      OTHER:  PRIMARY: Medicare A & B      Policy#: [id number]      Subscriber: Self CM Name:       Phone#:      Fax#:  Pre-Cert#: Eligible       Employer: Retired  Benefits:  Phone #:      Name: Verified online via [employer] Portal  Eff. Date: 03/01/90     Deduct: $1364      Out of Pocket Max: N/A      Life Max: N/A CIR: 100%      SNF: 100% days 1-20; 8-% days 21-100 Outpatient: 80%     Co-Pay: 20% Home Health: 100%      Co-Pay: $0 DME: 80%     Co-Pay: 20% Providers: Patient's Choice   SECONDARY: BCBS Supplement       Policy#: [id number]      Subscriber: Self CM Name:       Phone#:      Fax#:  Pre-Cert#:       Employer: Retired  Benefits:  Phone #: [phone]     Name:  Eff. Date:      Deduct:       Out of Pocket Max:       Life Max:  CIR:       SNF:  Outpatient:      Co-Pay:  Home Health:       Co-Pay:  DME:      Co-Pay:   Medicaid Application Date:       Case Manager:  Disability Application Date:       Case Worker:   Emergency Contact Information        Contact Information    Name Relation Home Work Maureen Morales, [place] Daughter   [phone]   Maureen Morales, [place]   [phone]     Current Medical History  Patient Admitting Diagnosis: Right periprosthetic hip fracture  History of Present Illness: Maureen Morales a 84 y.o.femalewith history of CAD, HTN, right hip hemiarthroplasty(still has hip precautions);who was admitted on 05/26/17 after fall with periprosthetic femur fracture and scalp hematoma. She was taken to OR for ORIF right femoral shaft with plate and screw. ABLA  treated with transfusion but continues to decline with Hgb 7.0. To be NWB RLE for weeks to  months per Dr. Ninfa Linden. Therapy evaluations done today revealing functional deficits and CIR recommended for follow up therapy. Patient admitted to Inpatient Rehabilitation 05/30/17.   Past Medical History      Past Medical History:  Diagnosis Date  . Asthma without status asthmaticus    unspecified  . CAD (coronary artery disease)   . Colitis    unspecified  . Complication of anesthesia    sensitive to anesthesia  . Depression   . Diabetes mellitus   . Diabetes mellitus type 2, uncomplicated ([place])   . Essential hypertension   . GERD (gastroesophageal reflux disease)   . 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 HLD (hyperlipidemia)   . Hyperlipidemia    unspecified  . Hypertension   . Myocardial infarction ([place]) 2018  . Peripheral vascular disease ([place])   . Positive H. pylori test   . Viral gastroenteritis due to [place]-like agent Nov 2012    Family History  family history includes Breast cancer (age of onset: 72) in her sister; Breast cancer (age of onset: 98) in her mother; Cancer in her mother; Colon cancer in her brother, brother, and father; Stroke in her sister.  Prior Rehab/Hospitalizations:  Has the patient had major surgery during 100 days prior to admission? Yes  Current Medications   Current Facility-Administered Medications:  .  0.9 %  sodium chloride infusion, , Intravenous, Continuous, Virgel Manifold, MD, Last Rate: 75 mL/hr at 05/29/17 1738, 75 mL/hr at 05/29/17 1738 .  0.9 %  sodium chloride infusion, , Intravenous, Once, Dana Allan I, MD .  acetaminophen (TYLENOL) tablet 650 mg, 650 mg, Oral, Q6H PRN, 650 mg at 05/30/17 0627 **OR** acetaminophen (TYLENOL) suppository 650 mg, 650 mg, Rectal, Q6H PRN, Mcarthur Rossetti, MD .  atorvastatin (LIPITOR) tablet 40 mg, 40 mg, Oral, Daily, Ivor Costa, MD, 40 mg at 05/30/17 1030 .   bacitracin ointment, , Topical, BID, [place], Sylvester I, MD .  fenofibrate tablet 160 mg, 160 mg, Oral, Daily, Ivor Costa, MD, 160 mg at 05/30/17 1029 .  hydrALAZINE (APRESOLINE) injection 5 mg, 5 mg, Intravenous, Q2H PRN, Ivor Costa, MD .  HYDROcodone-acetaminophen (NORCO/VICODIN) 5-325 MG per tablet 1-2 tablet, 1-2 tablet, Oral, Q6H PRN, Mcarthur Rossetti, MD, 1 tablet at 05/29/17 0735 .  insulin aspart (novoLOG) injection 0-5 Units, 0-5 Units, Subcutaneous, QHS, Dana Allan I, MD, 2 Units at 05/28/17 2104 .  insulin aspart (novoLOG) injection 0-9 Units, 0-9 Units, Subcutaneous, TID WC, Dana Allan I, MD, 1 Units at 05/30/17 [phone] .  insulin glargine (LANTUS) injection 10 Units, 10 Units, Subcutaneous, Daily, Dana Allan I, MD, 10 Units at 05/30/17 1000 .  loratadine (CLARITIN) tablet 10 mg, 10 mg, Oral, Daily, Ivor Costa, MD, 10 mg at 05/30/17 1030 .  losartan (COZAAR) tablet 100 mg, 100 mg, Oral, Daily, Ivor Costa, MD, 100 mg at 05/30/17 1030 .  menthol-cetylpyridinium (CEPACOL) lozenge 3 mg, 1 lozenge, Oral, PRN **OR** phenol (CHLORASEPTIC) mouth spray 1 spray, 1 spray, Mouth/Throat, PRN, Mcarthur Rossetti, MD .  methocarbamol (ROBAXIN) tablet 500 mg, 500 mg, Oral, Q6H PRN, 500 mg at 05/29/17 0738 **OR** methocarbamol (ROBAXIN) 500 mg in dextrose 5 % 50 mL IVPB, 500 mg, Intravenous, Q6H PRN, Mcarthur Rossetti, MD, Stopped at 05/27/17 1201 .  methocarbamol (ROBAXIN) tablet 500 mg, 500 mg, Oral, Q8H PRN, Ivor Costa, MD, 500 mg at 05/27/17 0212 .  metoCLOPramide (REGLAN) tablet 5-10 mg, 5-10 mg, Oral, Q8H PRN **OR** metoCLOPramide (REGLAN) injection 5-10  mg, 5-10 mg, Intravenous, Q8H PRN, Mcarthur Rossetti, MD .  metoprolol succinate (TOPROL-XL) 24 hr tablet 50 mg, 50 mg, Oral, Daily, Ivor Costa, MD, 50 mg at 05/30/17 1030 .  morphine 2 MG/ML injection 0.5 mg, 0.5 mg, Intravenous, Q2H PRN, Mcarthur Rossetti, MD, 0.5 mg at 05/28/17 2351 .  ondansetron  (ZOFRAN) tablet 4 mg, 4 mg, Oral, Q6H PRN **OR** ondansetron (ZOFRAN) injection 4 mg, 4 mg, Intravenous, Q6H PRN, Mcarthur Rossetti, MD .  oxyCODONE-acetaminophen (PERCOCET/ROXICET) 5-325 MG per tablet 1 tablet, 1 tablet, Oral, Q4H PRN, Ivor Costa, MD, 1 tablet at 05/27/17 2058 .  polyethylene glycol (MIRALAX / GLYCOLAX) packet 17 g, 17 g, Oral, Daily PRN, Ivor Costa, MD, 17 g at 05/29/17 1149 .  sertraline (ZOLOFT) tablet 50 mg, 50 mg, Oral, Daily, Ivor Costa, MD, 50 mg at 05/30/17 1030 .  vitamin C (ASCORBIC ACID) tablet 500 mg, 500 mg, Oral, BID, Ivor Costa, MD, 500 mg at 05/30/17 1029 .  zolpidem (AMBIEN) tablet 5 mg, 5 mg, Oral, QHS PRN, Ivor Costa, MD  Patients Current Diet: Diet Carb Modified Fluid consistency: Thin; Room service appropriate? Yes Diet - low sodium heart healthy Diet Carb Modified  Precautions / Restrictions Precautions Precautions: Fall Restrictions Weight Bearing Restrictions: Yes RLE Weight Bearing: Non weight bearing   Has the patient had 2 or more falls or a fall with injury in the past year?Yes  Prior Activity Level Community (5-7x/wk): Prior to 3 monhts ago patient was fully independent and driving; however, after her inital hip fracture she required assist with bathing and dressing.  She enjoys cooking and desires to be as independent as possible.    Home Assistive Devices / Equipment Home Assistive Devices/Equipment: Gilford Rile (specify type) Home Equipment: Walker - 2 wheels, Toilet riser  Prior Device Use: Indicate devices/aids used by the patient prior to current illness, exacerbation or injury? Walker  Prior Functional Level Prior Function Level of Independence: Needs assistance Gait / Transfers Assistance Needed: using RW for ambulation ADL's / Homemaking Assistance Needed: Pt receiving assist from personal care aide who were supervising/assisting with bathing and LB dressing  Self Care: Did the patient need help bathing, dressing,  using the toilet or eating? Needed some help  Indoor Mobility: Did the patient need assistance with walking from room to room (with or without device)? Independent  Stairs: Did the patient need assistance with internal or external stairs (with or without device)? Independent  Functional Cognition: Did the patient need help planning regular tasks such as shopping or remembering to take medications? Needed some help  Current Functional Level Cognition  Overall Cognitive Status: Impaired/Different from baseline Current Attention Level: Sustained Orientation Level: Oriented X4 Following Commands: Follows one step commands consistently, Follows one step commands with increased time, Follows multi-step commands inconsistently Safety/Judgement: Decreased awareness of safety, Decreased awareness of deficits General Comments: Pt requiring increased cues for weight bearing status and repetition of instruction    Extremity Assessment (includes Sensation/Coordination)  Upper Extremity Assessment: Generalized weakness  Lower Extremity Assessment: Defer to PT evaluation RLE Deficits / Details: Decreased strength and AROM consistent with above mentioned procedure.     ADLs  Overall ADL's : Needs assistance/impaired Eating/Feeding: Set up, Sitting Grooming: Set up, Sitting, Wash/dry face Upper Body Bathing: Minimal assistance, Sitting Lower Body Bathing: Moderate assistance, +2 for physical assistance, +2 for safety/equipment, Sit to/from stand Upper Body Dressing : Minimal assistance, Sitting Lower Body Dressing: +2 for physical assistance, +2 for safety/equipment, Maximal assistance, Sit to/from  stand Toilet Transfer: +2 for safety/equipment, +2 for physical assistance, Stand-pivot, BSC, RW, Maximal assistance Toilet Transfer Details (indicate cue type and reason): simulated in transfer EOB>recliner  Toileting- Clothing Manipulation and Hygiene: Maximal assistance, +2 for physical  assistance, +2 for safety/equipment, Sit to/from stand Functional mobility during ADLs: Maximal assistance, +2 for physical assistance, +2 for safety/equipment, Rolling walker    Mobility  Overal bed mobility: Needs Assistance Bed Mobility: Supine to Sit Supine to sit: Mod assist, +2 for physical assistance, HOB elevated General bed mobility comments: VC's for use of rails for support. Assist for LE advancement towards EOB and scooting assist provided with bed pad. Pt was able to scoot herself out of EOB once in an upright seated position.     Transfers  Overall transfer level: Needs assistance Equipment used: Rolling walker (2 wheeled) Transfers: Sit to/from Stand, Stand Pivot Transfers Sit to Stand: Min assist, +2 physical assistance, From elevated surface Stand pivot transfers: Mod assist, +2 physical assistance General transfer comment: Transferred to the L this session, and pt was able to scoot L foot around towards chair with increased initiation. VC's for maintenance of WB status - therapist kept foot under pt's R foot to ensure she was maintaining NWB.     Ambulation / Gait / Stairs / Wheelchair Mobility  Ambulation/Gait General Gait Details: Unable    Posture / Balance Balance Overall balance assessment: Needs assistance Sitting-balance support: Feet supported, No upper extremity supported Sitting balance-Leahy Scale: Fair Standing balance support: Bilateral upper extremity supported, During functional activity Standing balance-Leahy Scale: Poor Standing balance comment: Reliant on UE support on walker.     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 05/14/17 Bowel mgmt: Continent, last BM  05/30/17 Bladder mgmt: Incontinent, has also been using the bed pan Diabetic mgmt: Yes     Previous Home Environment Living Arrangements: Spouse/significant other Available Help at Discharge: Family, Personal care attendant [place]: Yes Type of Home Care Services: [place] (if known): private pay  Discharge Living Setting Plans for Discharge Living Setting: Other (Comment)(SNF-[facility] ) Type of Home at Discharge: [place] Name at Discharge: [facility] private room, patient/family request  Does the patient have any problems obtaining your medications?: No  Social/Family/Support Systems Patient Roles: Spouse, Parent Contact Information: Daughter Ariadna Setter  Anticipated Caregiver: SNF, but privte duty caregiver as well Anticipated Ambulance person Information: Cell:[phone] Ability/Limitations of Caregiver: Daughter works but patient has had a private duty caregiver for 35 years  Discharge Plan Discussed with Primary Caregiver: Yes Is Caregiver In Agreement with Plan?: Yes Does Caregiver/Family have Issues with Lodging/Transportation while Pt is in Rehab?: No  Goals/Additional Needs Patient/Family Goal for Rehab: PT/OT: Mod A Expected length of stay: 15-19 days to decrease burden of care Dietary Needs: Carb. Mod. diet restrictions  Equipment Needs: TBD Pt/Family Agrees to Admission and willing to participate: Yes Program Orientation Provided & Reviewed with Pt/Caregiver Including Roles  & Responsibilities: Yes  Barriers to Discharge: Medical stability, Other (comments)(2 person transfer assist )  Decrease burden of Care through IP rehab admission: Decrease number of caregivers  Possible need for SNF placement upon discharge: Yes and  patient and family have requested to return [facility] (private room)   Patient Condition: This patient's medical and functional status has changed since the consult  dated: 05/28/17 in which the Rehabilitation Physician determined and documented that the patient's condition is appropriate for intensive rehabilitative care in an inpatient rehabilitation facility. See "History of Present Illness" (above) for medical update. Functional changes are: Min-Mod A +2. Patient's medical and functional status update has been discussed with the Rehabilitation physician and patient remains appropriate for inpatient rehabilitation. Will admit to inpatient rehab today.  Preadmission Screen Completed By:  Gunnar Fusi, 05/30/2017 4:45 PM ______________________________________________________________________   Discussed status with Dr. Posey Pronto on 05/30/17 at 1655 and received telephone approval for admission today.  Admission Coordinator:  Gunnar Fusi, time 1655/Date 05/30/17             Revision History

## 2017-05-31 NOTE — Evaluation (Signed)
Occupational Therapy Assessment and Plan  Patient Details  Name: Maureen Morales MRN: [id number] Date of Birth: [birth year]  OT Diagnosis: abnormal posture, acute pain, muscle weakness (generalized) and pain in joint Rehab Potential: Rehab Potential (ACUTE ONLY): Fair ELOS: 14-17 days   Today's Date: 05/31/2017 OT Individual Time: 1000-1100 OT Individual Time Calculation (min): 60 min     Problem List:  Patient Active Problem List   Diagnosis Date Noted  . Intermittent asthma without complication   . Stage 3 chronic kidney disease ([place])   . Chronic diastolic congestive heart failure ([place])   . Periprosthetic fracture around internal prosthetic right hip joint ([place]) 05/30/2017  . Pressure injury of right heel, unstageable ([place])   . Anal fistula   . History of right hip hemiarthroplasty   . Benign essential HTN   . Acute blood loss anemia   . Diabetes mellitus type 2 in nonobese (H[facility])   . AKI (acute kidney injury) ([place])   . Post-operative pain   . Fall 05/26/2017  . Closed right hip fracture ([place]) 05/26/2017  . Normocytic anemia 05/26/2017  . Abdominal distention 05/26/2017  . CAD (coronary artery disease) 05/26/2017  . Depression   . HLD (hyperlipidemia)   . Essential hypertension   . 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 Closed displaced fracture of right femoral neck ([place]) 02/16/2017  . Diarrhea of infectious origin 11/28/2016  . Abnormal CT scan, colon   . Generalized anxiety disorder 09/07/2016  . Unsteady gait 09/07/2016  . NSTEMI (non-ST elevated myocardial infarction) ([place]) 08/30/2016  . Pain in joint, ankle and foot 06/24/2014  . S/P laparoscopic cholecystectomy 12/15/2013  . Preoperative general physical examination 12/15/2013  . Encounter for preventive health examination 11/13/2013  . Cervical spine disease 01/27/2013  . Anemia, iron deficiency 01/21/2013  . Right medial knee pain 01/09/2013  . Back pain, thoracic  08/27/2012  . Aortic stenosis, mild 05/06/2012  . Asthma 07/04/2011  . Hemorrhoids, internal 04/05/2011  . Nummular eczema 01/16/2011  . Essential hypertension   . Type II diabetes mellitus with nephropathy ([place])   . Hyperlipidemia     Past Medical History:  Past Medical History:  Diagnosis Date  . Asthma without status asthmaticus    unspecified  . CAD (coronary artery disease)   . Colitis    unspecified  . Complication of anesthesia    sensitive to anesthesia  . Depression   . Diabetes mellitus   . Diabetes mellitus type 2, uncomplicated ([place])   . Essential hypertension   . GERD (gastroesophageal reflux disease)   . 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 HLD (hyperlipidemia)   . Hyperlipidemia    unspecified  . Hypertension   . Myocardial infarction ([place]) 2018  . Peripheral vascular disease ([place])   . Positive H. pylori test   . Viral gastroenteritis due to [place]-like agent Nov 2012   Past Surgical History:  Past Surgical History:  Procedure Laterality Date  . ABDOMINAL HYSTERECTOMY    . BLADDER SURGERY    . CARDIAC CATHETERIZATION    . CHOLECYSTECTOMY    . COLONOSCOPY     06/16/1987, 02/20/1995, 05/04/1999, 02/14/2005, 05/17/2007  . ESOPHAGOGASTRODUODENOSCOPY     05/11/1987, 06/13/1995, 10/09/1995, 05/04/1999, 01/16/2002  . FLEXIBLE SIGMOIDOSCOPY N/A 05/02/2017   Procedure: FLEXIBLE SIGMOIDOSCOPY;  Surgeon: Manya Silvas, MD;  Location[facility] ENDOSCOPY;  Service: Endoscopy;  Laterality: N/A;  . HEMORRHOID SURGERY N/A 05/14/2017   Procedure: EXTERNAL THROMBOSED  HEMORRHOIDECTOMY;  Surgeon: Robert Bellow, MD;  Location: [facility] ORS;  Service: General;  Laterality: N/A;  . HIP ARTHROPLASTY Right 02/17/2017   Procedure: ARTHROPLASTY BIPOLAR HIP (HEMIARTHROPLASTY);  Surgeon: Claud Kelp, MD;  Location: [facility] ORS;  Service: Orthopedics;  Laterality: Right;  . LEFT HEART CATH AND CORONARY ANGIOGRAPHY N/A 08/31/2016   Procedure: Left Heart Cath and Coronary  Angiography;  Surgeon: Wellington Hampshire, MD;  Location: [facility] CV LAB;  Service: Cardiovascular;  Laterality: N/A;  . ORIF PERIPROSTHETIC FRACTURE Right 05/27/2017  . ORIF PERIPROSTHETIC FRACTURE Right 05/27/2017   Procedure: OPEN REDUCTION INTERNAL FIXATION (ORIF) PERIPROSTHETIC FRACTURE;  Surgeon: Mcarthur Rossetti, MD;  Location: [facility];  Service: Orthopedics;  Laterality: Right;  . TOTAL HIP ARTHROPLASTY Right   . uterian prolapse      Assessment & Plan Clinical Impression: Patient is a 84 y.o. year old female with history of CAD, HTN, right hip hemiarthroplasty(still has hip precautions);who was admitted on 05/26/17 after fall with periprosthetic femur fracture and scalp hematoma. History taken from chart review, family, patient, and care giver. CT abdomen done in ED due to distension and questioned left rectal mass. She was taken to OR for ORIF right femoral shaft with plate and screw. ABLA treated with transfusion but continues to decline with Hgb 7.0, plan for 2 more units per primary team. She is to be NWB RLE for weeks to months per Dr. Ninfa Linden. She developed wheezing last night and CXR done today was negative for acute changes. Therapy ongoing and patient with functional decline. CIR recommended for follow up therapy. Discussed case with inpatient rehab director. Discussed with attending physician and plan for transfusion due to Hemoglobin trending down. Attempted to contact Ortho due to concern for hematoma, continue to await call back. Patient transferred to CIR on 05/30/2017 .    Patient currently requires max with basic self-care skills secondary to muscle weakness, decreased cardiorespiratoy endurance and decreased sitting balance, decreased standing balance and difficulty maintaining precautions.  Prior to hospitalization, patient could complete ADLs and IADLs with supervision.  Patient will benefit from skilled intervention to decrease level of assist with basic  self-care skills prior to discharge home with care partner.  Anticipate patient will require 24 hour supervision and minimal physical assistance and follow up home health.  OT - End of Session Activity Tolerance: Decreased this session Endurance Deficit: Yes Endurance Deficit Description: Pt non symptomatic but Hg level was declining yesterday at 7.0 and had blood transfusion last night/early this AM. Due to new labs not available throughout session, held therapies to bedside only until cleared OT Assessment Rehab Potential (ACUTE ONLY): Fair OT Barriers to Discharge: Other (comments) OT Barriers to Discharge Comments: none known at this time OT Patient demonstrates impairments in the following area(s): Balance;Safety;Edema;Endurance;Pain;Motor OT Basic ADL's Functional Problem(s): Grooming;Bathing;Dressing;Toileting OT Advanced ADL's Functional Problem(s): Simple Meal Preparation OT Transfers Functional Problem(s): Toilet;Tub/Shower OT Additional Impairment(s): None OT Plan OT Intensity: Minimum of 1-2 x/day, 45 to 90 minutes OT Frequency: 5 out of 7 days OT Duration/Estimated Length of Stay: 14-17 days OT Treatment/Interventions: Balance/vestibular training;Discharge planning;Functional electrical stimulation;Pain management;Self Care/advanced ADL retraining;Therapeutic Activities;UE/LE Coordination activities;Functional mobility training;Patient/family education;Therapeutic Exercise;Wheelchair propulsion/positioning;DME/adaptive equipment instruction;Psychosocial support OT Self Feeding Anticipated Outcome(s): n/a OT Basic Self-Care Anticipated Outcome(s): S- min A OT Toileting Anticipated Outcome(s): min A OT Bathroom Transfers Anticipated Outcome(s): min A OT Recommendation Recommendations for Other Services: Therapeutic Recreation consult Therapeutic Recreation Interventions: [place] group Patient destination: Home Follow Up Recommendations: 24 hour supervision/assistance;Home  health OT Equipment Recommended: To be determined   Skilled Therapeutic Intervention At time of evaluation pt with unstable lab values and needing to be seen from bed level. OT educated pt and daughter on OT purpose, POC, and goals with them verbalizing understanding and agreement. Pt verbalizing need for BM and rolling R with min A and use of bed rails. Pt placed on bed pan and having large BM. Pt required total A for hygiene and RN arriving to change dressing as it was soiled. Pt washing from bed level this session. Pt fatigues very quickly and needing multiple rest breaks. Pt remained in bed with bed alarm activated and call bell within reach upon exiting the room.   OT Evaluation Precautions/Restrictions  Precautions Precautions: Fall;Posterior Hip Precaution Comments: pt recalls 2/3 posterior hip precautions; sign posted in room Restrictions Weight Bearing Restrictions: Yes RLE Weight Bearing: Non weight bearing Pain Pain Assessment Pain Assessment: No/denies pain Pain Score: Asleep Faces Pain Scale: No hurt Pain Type: Surgical pain Pain Orientation: Right Pain Descriptors / Indicators: Discomfort;Operative site guarding Pain Onset: With Activity Patients Stated Pain Goal: 0 Pain Intervention(s): Medication (See eMAR);Cold applied;Back rub Multiple Pain Sites: No PAINAD (Pain Assessment in Advanced Dementia) Breathing: normal Negative Vocalization: none Home Living/Prior Functioning Home Living Family/patient expects to be discharged to:: Private residence Living Arrangements: Children, Other relatives Available Help at Discharge: Family, Personal care attendant, Available 24 hours/day Type of Home: House Home Access: Ramped entrance Home Layout: Two level, Able to live on main level with bedroom/bathroom Alternate Level Stairs-Number of Steps: flight Bathroom Shower/Tub: Multimedia programmer: Handicapped height Additional Comments: Pt and husband have aides  but they primarily assist husband.  Lives With: Spouse Prior Function Level of Independence: Requires assistive device for independence  Able to Take Stairs?: Yes Vision Baseline Vision/History: Wears glasses Wears Glasses: At all times Patient Visual Report: No change from baseline Cognition Overall Cognitive Status: Within Functional Limits for tasks assessed Arousal/Alertness: Awake/alert Orientation Level: Person;Place;Situation Person: Oriented Place: Oriented Situation: Oriented Year: 2019 Month: January Day of Week: Correct Memory: Appears intact Immediate Memory Recall: Sock;Blue;Bed Memory Recall: Sock;Blue;Bed Memory Recall Sock: Without Cue Memory Recall Blue: Without Cue Memory Recall Bed: Without Cue Safety/Judgment: Appears intact Sensation Sensation Light Touch: Appears Intact Coordination Gross Motor Movements are Fluid and Coordinated: No Fine Motor Movements are Fluid and Coordinated: Yes Motor  Motor Motor: Other (comment);Abnormal postural alignment and control Motor - Skilled Clinical Observations: pain in RLE limiting as well as NWB status  Trunk/Postural Assessment  Cervical Assessment Cervical Assessment: (forward head) Thoracic Assessment Thoracic Assessment: (kyphotic posture) Lumbar Assessment Lumbar Assessment: (posterior tilt; dec WB to R hip) Postural Control Postural Control: Deficits on evaluation(posterior bias in standing; difficulty with NWB)  Balance Balance Balance Assessed: Yes Static Sitting Balance Static Sitting - Level of Assistance: 5: Stand by assistance;4: Min assist Dynamic Sitting Balance Dynamic Sitting - Level of Assistance: 4: Min assist Static Standing Balance Static Standing - Level of Assistance: 2: Max assist Dynamic Standing Balance Dynamic Standing - Level of Assistance: Not tested (comment)(medically unstable awaiting new Hg values) Extremity/Trunk Assessment RUE Assessment RUE Assessment: Within  Functional Limits LUE Assessment LUE Assessment: Within Functional Limits   See Function Navigator for Current Functional Status.   Refer to Care Plan for Long Term Goals  Recommendations for other services: Therapeutic Recreation  Kitchen group   Discharge Criteria: Patient will be discharged from OT if patient refuses treatment 3 consecutive times without medical reason, if treatment  goals not met, if there is a change in medical status, if patient makes no progress towards goals or if patient is discharged from hospital.  The above assessment, treatment plan, treatment alternatives and goals were discussed and mutually agreed upon: by patient and by family  Gypsy Decant 05/31/2017, 1:40 PM

## 2017-05-31 NOTE — Progress Notes (Signed)
Occupational Therapy Session Note  Patient Details  Name: Maureen Morales MRN: [id number] Date of Birth: July 12, 1922  Today's Date: 05/31/2017 OT Individual Time: 0200-0300 OT Individual Time Calculation (min): 60 min    Short Term Goals: Week 1:  OT Short Term Goal 1 (Week 1): Pt will perform toilet transfer with mod A. OT Short Term Goal 2 (Week 1): Pt will perform LB dressing with mod A. OT Short Term Goal 3 (Week 1): Pt will engage in 15 minutes of functional task with less than 2 rest breaks needed.   Skilled Therapeutic Interventions/Progress Updates:  Actor;Functional electrical stimulation;Pain management;Self Care/advanced ADL retraining;Therapeutic Activities;UE/LE Coordination activities;Functional mobility training;Patient/family education;Therapeutic Exercise;Wheelchair propulsion/positioning;DME/adaptive equipment instruction;Psychosocial support   1:1 Focus on transfer training. Performed stand pivot transfer with RW from recliner to w/c with max A. Pt able to come into standing with mod A but required max to pivot. Transitioned to the gym and focus on scoot pivot transfers with mod A with instructional cues each time. Also focus on sit to stands from mat with RW with min to mod A. Pt able to maintain NWB status but difficulty with pivoting on left LE. After transitioning back to w/c performed toilet transfer with max A with more than reasonable amt of time and total A for toileting. Pt returned to bed with mod A squat pivot and mod A to return to supine. Left in bed in prep for vascular appointment.   Therapy Documentation Precautions:  Precautions Precautions: Fall, Posterior Hip Precaution Comments: pt recalls 2/3 posterior hip precautions; sign posted in room Restrictions Weight Bearing Restrictions: Yes RLE Weight Bearing: Non weight bearing General:   Vital Signs: Therapy Vitals Temp: 98.3 F (36.8 C) Temp Source:  Oral Pulse Rate: 82 Resp: 18 BP: (!) 154/54 Patient Position (if appropriate): Sitting Oxygen Therapy SpO2: 97 % O2 Device: Not Delivered Pain: Pain Assessment Pain Assessment: No/denies pain Pain Score: Asleep Faces Pain Scale: No hurt  See Function Navigator for Current Functional Status.   Therapy/Group: Individual Therapy  [name] [facility] 05/31/2017, 3:14 PM

## 2017-05-31 NOTE — Progress Notes (Signed)
Maureen Morales PHYSICAL MEDICINE & REHABILITATION     PROGRESS NOTE  Subjective/Complaints:  Patient seen sitting up in bed this morning eating breakfast. He states he did not sleep well overnight because he did not begin until midnight  ROS: Denies CP, SOB, nausea, vomiting, diarrhea.  Objective: Vital Signs: Blood pressure (!) 181/67, pulse 74, temperature (!) 97.1 F (36.2 C), temperature source Oral, resp. rate 16, height 5\' 1"  (1.549 m), weight 63.7 kg (140 lb 6.9 oz), SpO2 100 %. Dg Chest Port 1 View  Result Date: 05/30/2017 CLINICAL DATA:  Fever EXAM: PORTABLE CHEST 1 VIEW COMPARISON:  None. FINDINGS: 1300 hours. Lungs are hyperexpanded. The lungs are clear without focal pneumonia, edema, pneumothorax or pleural effusion. Interstitial markings are diffusely coarsened with chronic features. The cardio pericardial silhouette is enlarged. The visualized bony structures of the thorax are intact. IMPRESSION: Emphysema with chronic interstitial coarsening. No acute cardiopulmonary findings. Electronically Signed   By: Misty Stanley M.D.   On: 05/30/2017 13:38   Recent Labs    05/29/17 0541 05/30/17 0531  WBC 10.6* 9.4  HGB 7.5* 7.0*  HCT 22.9* 20.8*  PLT 203 229   Recent Labs    05/29/17 0541  NA 137  K 3.7  CL 110  GLUCOSE 185*  BUN 30*  CREATININE 1.25*  CALCIUM 8.2*   CBG (last 3)  Recent Labs    05/30/17 1624 05/30/17 2037 05/31/17 0646  GLUCAP 227* 179* 144*    Wt Readings from Last 3 Encounters:  05/31/17 63.7 kg (140 lb 6.9 oz)  05/22/17 57.2 kg (126 lb)  05/14/17 58.1 kg (128 lb)    Physical Exam:  BP (!) 181/67   Pulse 74   Temp (!) 97.1 F (36.2 C) (Oral)   Resp 16   Ht 5\' 1"  (1.549 m)   Wt 63.7 kg (140 lb 6.9 oz)   SpO2 100%   BMI 26.53 kg/m  Constitutional: She appears well-developed and well-nourished. No distress.  HENT: Normocephalic and atraumatic.  Eyes: EOM are normal. No discharge.  Cardiovascular: Normal rate and regular rhythm. Murmur  heard. No JVD. Respiratory: Effort normal. Clear.  GI: Bowel sounds are normal. She exhibits no distension.  Musculoskeletal: Edema right hip and thigh  Neurological: She is alert and oriented.  Motor: B/l UE: 4/5 proximal to distal RLE: HF, KE 1/5, ADF/PF 3+/5 (stable) LLE: HF, KE 3+/5, ADF/PF 4/5  Skin: Dry surgical dressing in place. Right heel with fluctuant blister.   Psychiatric: She has a normal mood and affect. Her behavior is normal.    Assessment/Plan: 1. Functional deficits secondary to right peri-prosthetic hip fracture which require 3+ hours per day of interdisciplinary therapy in a comprehensive inpatient rehab setting. Physiatrist is providing close team supervision and 24 hour management of active medical problems listed below. Physiatrist and rehab team continue to assess barriers to discharge/monitor patient progress toward functional and medical goals.  Function:  Bathing Bathing position      Bathing parts      Bathing assist        Upper Body Dressing/Undressing Upper body dressing                    Upper body assist        Lower Body Dressing/Undressing Lower body dressing  Lower body assist        Toileting Toileting          Toileting assist     Transfers Chair/bed Clinical biochemist          Cognition Comprehension    Expression    Social Interaction    Problem Solving    Memory      Medical Problem List and Plan: 1.  Deficits with mobility, transfers, and self-care secondary to right peri-prosthetic hip fracture.    Begin CIR 2.  DVT Prophylaxis/Anticoagulation: Mechanical: Sequential compression devices, below knee Bilateral lower extremities--no blood thinners due to history of hematochezia.  3. Pain Management: oxycodone prn 4. Mood: team to provide ego support to help manage pain and anxiety 5. Neuropsych: This  patient is capable of making decisions on her own behalf. 6. Skin/Wound Care: Monitor scalp wound, anal wound and right hip incision for healing.  7. Fluids/Electrolytes/Nutrition: Monitor I/Os.  8. ABLA:    Post transfusion (Which was completed the this morning) hemoglobin pending 9. HTN: Monitor BP bid--continue Toprol XL and Cozaar.   Monitor with increased mobility, Hypertensive crisis overnight, likely due to volume from transfusion 10. T2DM: Hgb A1C-7.0. BS    Started on Lantus. Continue for now and transition to oral medication as mobility improves.    Monitor with increased mobility 11. H/o Asthma: Scheduled nebs qid for now. 12. Right heel ulcer: Ordered prevalon boot. Foam dressing. Added protein supplement to help with wound healing.  13. Recent Anal fistulotomy: Keep area clean and dry.  14. Chronic renal failure?: Baseline SCr around 1.2?    Labs pending for 1/31 15. Right hemiarthroplasty: She is to continue right total hip precautions as well as  NWB due to recent surgery. .  16. CAD with moderate aortic stenosis/Grade 2 diastolic dysfunction: Monitor daily weights. On lipitor, Cozaar and Toprol. Avoid overload.  Filed Weights   05/30/17 2026 05/31/17 0500  Weight: 65 kg (143 lb 4.8 oz) 63.7 kg (140 lb 6.9 oz)   LOS (Days) 1 A FACE TO FACE EVALUATION WAS PERFORMED  Maureen Morales 05/31/2017 8:23 AM

## 2017-05-31 NOTE — Evaluation (Signed)
Physical Therapy Assessment and Plan  Patient Details  Name: Maureen Morales MRN: [id number] Date of Birth: 1922/07/27  PT Diagnosis: Abnormal posture, Difficulty walking, Edema, Impaired cognition, Muscle weakness and Pain in joint Rehab Potential: Good ELOS: 14-17 days   Today's Date: 05/31/2017 PT Individual Time: 4585-9292 PT Individual Time Calculation (min): 75 min  and Today's Date: 05/31/2017   Problem List:  Patient Active Problem List   Diagnosis Date Noted  . Intermittent asthma without complication   . Stage 3 chronic kidney disease ([place])   . Chronic diastolic congestive heart failure ([place])   . Periprosthetic fracture around internal prosthetic right hip joint ([place]) 05/30/2017  . Pressure injury of right heel, unstageable ([place])   . Anal fistula   . History of right hip hemiarthroplasty   . Benign essential HTN   . Acute blood loss anemia   . Diabetes mellitus type 2 in nonobese (HCC)   . AKI (acute kidney injury) ([place])   . Post-operative pain   . Fall 05/26/2017  . Closed right hip fracture ([place]) 05/26/2017  . Normocytic anemia 05/26/2017  . Abdominal distention 05/26/2017  . CAD (coronary artery disease) 05/26/2017  . Depression   . HLD (hyperlipidemia)   . Essential hypertension   . 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 Closed displaced fracture of right femoral neck ([place]) 02/16/2017  . Diarrhea of infectious origin 11/28/2016  . Abnormal CT scan, colon   . Generalized anxiety disorder 09/07/2016  . Unsteady gait 09/07/2016  . NSTEMI (non-ST elevated myocardial infarction) ([place]) 08/30/2016  . Pain in joint, ankle and foot 06/24/2014  . S/P laparoscopic cholecystectomy 12/15/2013  . Preoperative general physical examination 12/15/2013  . Encounter for preventive health examination 11/13/2013  . Cervical spine disease 01/27/2013  . Anemia, iron deficiency 01/21/2013  . Right medial knee pain 01/09/2013  . Back  pain, thoracic 08/27/2012  . Aortic stenosis, mild 05/06/2012  . Asthma 07/04/2011  . Hemorrhoids, internal 04/05/2011  . Nummular eczema 01/16/2011  . Essential hypertension   . Type II diabetes mellitus with nephropathy ([place])   . Hyperlipidemia     Past Medical History:  Past Medical History:  Diagnosis Date  . Asthma without status asthmaticus    unspecified  . CAD (coronary artery disease)   . Colitis    unspecified  . Complication of anesthesia    sensitive to anesthesia  . Depression   . Diabetes mellitus   . Diabetes mellitus type 2, uncomplicated ([place])   . Essential hypertension   . GERD (gastroesophageal reflux disease)   . 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 HLD (hyperlipidemia)   . Hyperlipidemia    unspecified  . Hypertension   . Myocardial infarction ([place]) 2018  . Peripheral vascular disease ([place])   . Positive H. pylori test   . Viral gastroenteritis due to [place]-like agent Nov 2012   Past Surgical History:  Past Surgical History:  Procedure Laterality Date  . ABDOMINAL HYSTERECTOMY    . BLADDER SURGERY    . CARDIAC CATHETERIZATION    . CHOLECYSTECTOMY    . COLONOSCOPY     06/16/1987, 02/20/1995, 05/04/1999, 02/14/2005, 05/17/2007  . ESOPHAGOGASTRODUODENOSCOPY     05/11/1987, 06/13/1995, 10/09/1995, 05/04/1999, 01/16/2002  . FLEXIBLE SIGMOIDOSCOPY N/A 05/02/2017   Procedure: FLEXIBLE SIGMOIDOSCOPY;  Surgeon: Manya Silvas, MD;  Location: [facility] ENDOSCOPY;  Service: Endoscopy;  Laterality: N/A;  . HEMORRHOID SURGERY N/A 05/14/2017   Procedure: EXTERNAL  THROMBOSED HEMORRHOIDECTOMY;  Surgeon: Robert Bellow, MD;  Location: [facility] ORS;  Service: General;  Laterality: N/A;  . HIP ARTHROPLASTY Right 02/17/2017   Procedure: ARTHROPLASTY BIPOLAR HIP (HEMIARTHROPLASTY);  Surgeon: Claud Kelp, MD;  Location: [facility] ORS;  Service: Orthopedics;  Laterality: Right;  . LEFT HEART CATH AND CORONARY ANGIOGRAPHY N/A 08/31/2016   Procedure: Left Heart Cath  and Coronary Angiography;  Surgeon: Wellington Hampshire, MD;  Location: [place] CV LAB;  Service: Cardiovascular;  Laterality: N/A;  . ORIF PERIPROSTHETIC FRACTURE Right 05/27/2017  . ORIF PERIPROSTHETIC FRACTURE Right 05/27/2017   Procedure: OPEN REDUCTION INTERNAL FIXATION (ORIF) PERIPROSTHETIC FRACTURE;  Surgeon: Mcarthur Rossetti, MD;  Location: [facility];  Service: Orthopedics;  Laterality: Right;  . TOTAL HIP ARTHROPLASTY Right   . uterian prolapse      Assessment & Plan Clinical Impression: Patient is a 84 y.o. year old female with recent admission to the hospital with history of CAD, HTN, right hip hemiarthroplasty(still has hip precautions);who was admitted on 05/26/17 after fall with periprosthetic femur fracture and scalp hematoma. History taken from chart review, family, patient, and care giver. CT abdomen done in ED due to distension and questioned left rectal mass. She was taken to OR for ORIF right femoral shaft with plate and screw. ABLA treated with transfusion but continues to decline with Hgb 7.0, plan for 2 more units per primary team. She is to be NWB RLE for weeks to months per Dr. Ninfa Linden. She developed wheezing last night and CXR done today was negative for acute changes. Therapy ongoing and patient with functional decline. CIR recommended for follow up therapy. Discussed case with inpatient rehab director. Discussed with attending physician and plan for transfusion due to Hemoglobin trending down. Attempted to contact Ortho due to concern for hematoma, continue to await call back. Patient transferred to CIR on 05/30/2017 .   Patient currently requires max with mobility secondary to muscle weakness and muscle joint tightness, decreased cardiorespiratoy endurance, decreased problem solving and decreased memory and decreased sitting balance, decreased standing balance, decreased postural control, decreased balance strategies and difficulty maintaining precautions.  Prior to  hospitalization, patient was supervision with mobility and lived with Spouse in a House home.  Home access is  Ramped entrance.  Patient will benefit from skilled PT intervention to maximize safe functional mobility, minimize fall risk and decrease caregiver burden for planned discharge home with 24 hour assist.  Anticipate patient will benefit from follow up [facility] at discharge.  PT - End of Session Activity Tolerance: Decreased this session Endurance Deficit: Yes Endurance Deficit Description: Pt non symptomatic but Hg level was declining yesterday at 7.0 and had blood transfusion last night/early this AM. Due to new labs not available throughout session, held therapies to bedside only until cleared PT Assessment Rehab Potential (ACUTE/IP ONLY): Good PT Barriers to Discharge: Medical stability;Weight bearing restrictions;Incontinence PT Patient demonstrates impairments in the following area(s): Balance;Edema;Endurance;Motor;Pain;Skin Integrity PT Transfers Functional Problem(s): Bed Mobility;Bed to Chair;Car;Furniture PT Locomotion Functional Problem(s): Ambulation;Wheelchair Mobility;Stairs PT Plan PT Intensity: Minimum of 1-2 x/day ,45 to 90 minutes PT Frequency: 5 out of 7 days PT Duration Estimated Length of Stay: 14-17 days PT Treatment/Interventions: Ambulation/gait training;Balance/vestibular training;Cognitive remediation/compensation;Discharge planning;Community reintegration;Disease management/prevention;DME/adaptive equipment instruction;Functional mobility training;Neuromuscular re-education;Pain management;Patient/family education;Psychosocial support;Skin care/wound management;Splinting/orthotics;Therapeutic Activities;Therapeutic Exercise;Wheelchair propulsion/positioning;UE/LE Coordination activities;UE/LE Strength taining/ROM PT Transfers Anticipated Outcome(s): supervision to min assist PT Locomotion Anticipated Outcome(s): supervision w/c mobility; min assist short distance  gait PT Recommendation Follow Up Recommendations: Home health PT;24 hour supervision/assistance  Patient destination: Home Equipment Recommended: To be determined Equipment Details: daughter and pt report they have access to all kinds of DME upon d/c. Pt used rollator PTA  Skilled Therapeutic Intervention Evaluation completed (see details above and below) at bed level only due to awaiting new lab values after transfusion completed last night/early this AM. Pt had downward trending Hg at 7.0 as last value. Deferred OOB mobility until new values demonstrate stable Hg level. Pt and daughter aware of why session was limited.  Focused on education on PT POC and goals and individual treatment initiated with focus on functional bed mobility, RLE therex, and sit <> stand with RW. Pt requires extra time for all tasks and cues for sequencing. Pt does better when attempting to move her RLE with bed mobility rather than PT moving it. Due to urgent needs for urination, rolled on and off of bed pan x 2 and once EOB, pt with urgent BM and unable to hold long enough to get back into supine or on bed pan. Total assist for hygiene, min assist for rolling, and then pt with BM again. RN notified x 2 to change dressing on bottom. Instructed in seated LAQ on RLE x 7 reps until urge for BM occurred. Sit <> stand from EOB x 2 with cues for hand placement with max assist and PT maintained foot under RLE with posterior bias and difficulty coming to full standing posture.   PT Evaluation Precautions/Restrictions Precautions Precautions: Fall;Posterior Hip Precaution Comments: pt recalls 2/3 posterior hip precautions; sign posted in room Restrictions Weight Bearing Restrictions: Yes RLE Weight Bearing: Non weight bearing Pain Reports pain in R hip which increased with movement - unrated. Pt does better when she attempts to move it than therapist.  Home Living/Prior Functioning Home Living Available Help at Discharge:  Family;Personal care attendant;Available 24 hours/day Type of Home: House Home Access: Ramped entrance Home Layout: Two level;Able to live on main level with bedroom/bathroom Alternate Level Stairs-Number of Steps: flight  Lives With: Spouse Prior Function Level of Independence: Requires assistive device for independence(supervision level - aide 24/7)  Able to Take Stairs?: Yes Cognition Memory: Impaired Decreased recall of new information; decreased carryover of precautions Safety/Judgment: Appears intact Sensation Sensation Light Touch: Appears Intact Coordination Gross Motor Movements are Fluid and Coordinated: No Motor  Motor Motor: Other (comment);Abnormal postural alignment and control Motor - Skilled Clinical Observations: pain in RLE limiting as well as NWB status      Trunk/Postural Assessment  Cervical Assessment Cervical Assessment: (forward head) Thoracic Assessment Thoracic Assessment: (kyphotic posture) Lumbar Assessment Lumbar Assessment: (posterior tilt; dec WB to R hip) Postural Control Postural Control: Deficits on evaluation(posterior bias in standing; difficulty with NWB)  Balance Balance Balance Assessed: Yes Static Sitting Balance Static Sitting - Level of Assistance: 5: Stand by assistance;4: Min assist Dynamic Sitting Balance Dynamic Sitting - Level of Assistance: 4: Min assist Static Standing Balance Static Standing - Level of Assistance: 2: Max assist Dynamic Standing Balance Dynamic Standing - Level of Assistance: Not tested (comment)(medically unstable awaiting new Hg values) Extremity Assessment  RUE Assessment RUE Assessment: Within Functional Limits LUE Assessment LUE Assessment: Within Functional Limits RLE Assessment RLE Assessment: Exceptions to [facility] RLE Strength RLE Overall Strength Comments: limited due to pain and edema in RLE; posterior hip precautions. in supine grossly 2 to 3-/5 LLE Assessment LLE Assessment: (grossly  4/5)   See Function Navigator for Current Functional Status.   Refer to Care Plan for Long Term Goals  Recommendations  for other services: Therapeutic Recreation  Kitchen group and Other balance/engagement  Discharge Criteria: Patient will be discharged from PT if patient refuses treatment 3 consecutive times without medical reason, if treatment goals not met, if there is a change in medical status, if patient makes no progress towards goals or if patient is discharged from hospital.  The above assessment, treatment plan, treatment alternatives and goals were discussed and mutually agreed upon: by patient and by family  Juanna Cao, PT, DPT  05/31/2017, 12:24 PM

## 2017-05-31 NOTE — Progress Notes (Signed)
Physical Medicine and Rehabilitation Consult   Reason for Consult: functional deficits due to right femur fracture  Referring Physician: Dr. Marthenia Rolling.   HPI: Maureen Morales is a 84 y.o. female with history of CAD, HTN, right hip hemiarthroplasty (still has hip precautions);  who was admitted on 05/26/17 after fall with periprosthetic femur fracture and scalp hematoma.  CT abdomen done in ED due to distension and questioned left rectal mass.  She was taken to OR for ORIF right femoral shaft with plate and screw. ABLA treated with transfusion. To be NWB RLE for weeks to months per Dr. Ninfa Linden. Therapy evaluations done today revealing functional deficits and CIR recommended for follow up therapy.   Was independent and driving prior to fracture in Oct 2018. Has been using a walker with supervision for mobility.  Has had an aide for help prn for past 35 years. Still reasonably active for her age--was helping with cooking when she fell. She had 4 weeks [facility] at [facility] last [facility].   Review of Systems  HENT: Positive for hearing loss (left ear). Negative for tinnitus.   Eyes: Negative for blurred vision and double vision.  Respiratory: Negative for shortness of breath.   Cardiovascular: Negative for chest pain and palpitations.  Gastrointestinal: Negative for heartburn and nausea.  Genitourinary: Positive for frequency.  Musculoskeletal: Negative for neck pain.  Skin: Negative for rash.  Neurological: Positive for weakness.  Psychiatric/Behavioral: Negative for memory loss. The patient does not have insomnia.   All other systems reviewed and are negative.         Past Medical History:  Diagnosis Date  . CAD (coronary artery disease)   . Depression   . Essential hypertension   . HLD (hyperlipidemia)          Past Surgical History:  Procedure Laterality Date  . TOTAL HIP ARTHROPLASTY Right   . uterian prolapse           Family History  Problem Relation Age of  Onset  . Breast cancer Mother   . Colon cancer Father   . Stroke Sister     Social History:   Married.  Lives at home with husband who is 100. They have each have an aide. She was independent with walker and supervision. Needed  She r ADLsreports that  has never smoked. She does not have any smokeless tobacco history on file. She reports that she does not drink alcohol or use drugs.         Allergies  Allergen Reactions  . Asa [Aspirin] Anaphylaxis  . Darvon [Propoxyphene] Nausea And Vomiting  . Ferrous Sulfate Diarrhea  . Meperidine And Related   . Penicillins Other (See Comments)    Pt and family unsure of details          Medications Prior to Admission  Medication Sig Dispense Refill  . atorvastatin (LIPITOR) 40 MG tablet Take 40 mg by mouth daily.    . fenofibrate 160 MG tablet Take 160 mg by mouth daily.    [name] loratadine (CLARITIN) 10 MG tablet Take 10 mg by mouth daily.    [name] losartan (COZAAR) 100 MG tablet Take 100 mg by mouth daily.    . metoprolol succinate (TOPROL-XL) 50 MG 24 hr tablet Take 50 mg by mouth daily. Take with or immediately following a meal.    . sertraline (ZOLOFT) 50 MG tablet Take 50 mg by mouth daily.    . vitamin C (ASCORBIC ACID) 500 MG tablet Take 500  mg by mouth 2 (two) times daily.      Home: Home Living Family/patient expects to be discharged to:: Private residence Living Arrangements: Spouse/significant other Available Help at Discharge: Family, Personal care attendant Home Equipment: Environmental consultant - 2 wheels, Toilet riser  Functional History: Prior Function Level of Independence: Needs assistance Gait / Transfers Assistance Needed: using RW for ambulation ADL's / Homemaking Assistance Needed: Pt receiving assist from personal care aide who were supervising/assisting with bathing and LB dressing Functional Status:  Mobility: Bed Mobility Overal bed mobility: Needs Assistance Bed Mobility: Supine to Sit Supine to  sit: Mod assist, +2 for physical assistance, HOB elevated General bed mobility comments: VC's for use of rails for support. Assist for LE advancement towards EOB and scooting assist provided with bed pad. Pt was able to scoot herself out of EOB once in an upright seated position.  Transfers Overall transfer level: Needs assistance Equipment used: Rolling walker (2 wheeled) Transfers: Sit to/from Stand, [employer] Transfers Sit to Stand: Mod assist, +2 physical assistance Stand pivot transfers: Max assist, +2 physical assistance General transfer comment: VC's for maintenance of NWB status on the RLE. Noted pt holding RLE up in the air throughout entire transfer. Pt was able to advance L foot laterally using a heel scoot and then toe scoot method, but once turned, pt unable to gain enough leverage with UE's to hop backwards towards chair. Max assist provided overall for transition safely to chair.  Ambulation/Gait General Gait Details: Unable  ADL: ADL Overall ADL's : Needs assistance/impaired Eating/Feeding: Set up, Sitting Grooming: Set up, Sitting, Wash/dry face Upper Body Bathing: Minimal assistance, Sitting Lower Body Bathing: Moderate assistance, +2 for physical assistance, +2 for safety/equipment, Sit to/from stand Upper Body Dressing : Minimal assistance, Sitting Lower Body Dressing: +2 for physical assistance, +2 for safety/equipment, Maximal assistance, Sit to/from stand Toilet Transfer: +2 for safety/equipment, +2 for physical assistance, Stand-pivot, BSC, RW, Maximal assistance Toilet Transfer Details (indicate cue type and reason): simulated in transfer EOB>recliner  Toileting- Clothing Manipulation and Hygiene: Maximal assistance, +2 for physical assistance, +2 for safety/equipment, Sit to/from stand Functional mobility during ADLs: Maximal assistance, +2 for physical assistance, +2 for safety/equipment, Rolling walker  Cognition: Cognition Overall Cognitive Status:  Impaired/Different from baseline Orientation Level: (uta r/t drowsiness) Cognition Arousal/Alertness: Awake/alert(sleepy but awake and participating during session) Behavior During Therapy: WFL for tasks assessed/performed Overall Cognitive Status: Impaired/Different from baseline Area of Impairment: Attention, Memory, Following commands, Safety/judgement, Awareness, Problem solving Current Attention Level: Sustained Memory: Decreased short-term memory, Decreased recall of precautions Following Commands: Follows one step commands consistently, Follows one step commands with increased time, Follows multi-step commands inconsistently Safety/Judgement: Decreased awareness of safety, Decreased awareness of deficits Awareness: Intellectual Problem Solving: Slow processing, Decreased initiation, Difficulty sequencing, Requires verbal cues General Comments: Pt perseverating on whether daughter slept well overnight. Noted decreased recall of WB precautions when asked verbally however maintained NWB status well. At end of session pt asking whether she is going home today.   Blood pressure (!) 133/45, pulse 81, temperature 98.6 F (37 C), temperature source Oral, resp. rate 16, SpO2 99 %. Physical Exam  Nursing note and vitals reviewed. Constitutional: She is oriented to person, place, and time. She appears well-developed and well-nourished.  Frail appearing elderly female.   HENT:  Head: Normocephalic.  Mouth/Throat: Mucous membranes are dry.  Eyes: Conjunctivae and EOM are normal. Pupils are equal, round, and reactive to light.  Neck: Normal range of motion. Neck supple.  Cardiovascular: Normal rate  and regular rhythm.  Murmur heard. Respiratory: Effort normal and breath sounds normal. No stridor. No respiratory distress. She has no wheezes.  GI: Soft. Bowel sounds are normal. She exhibits distension. There is no tenderness. There is no guarding.  Musculoskeletal: She exhibits edema and  tenderness.  RLE limited due to pain inhibition.  Surgical dressing on right hip.   Neurological: She is alert and oriented to person, place, and time.  HOH in left ear.  Slow speech.  Was not able to recall name of hospital  but able to answer orientation questions.  She was able to follow simple commands motor without difficulty.  Motor: B/l UE 4-/5 proximal to distal RLE: HF, KE 1+/5, ADF/PF 3/5 (pain inhibition) LLE: HF, KE 2+/5, ADF/PF 3+/5   Skin: Skin is warm and dry.  Psychiatric: Her affect is blunt. She is slowed. She does not express impulsivity.  Assessment/Plan: Diagnosis: Right periprosthetic hip fracture Labs independently reviewed.  Records reviewed and summated above.  1. Does the need for close, 24 hr/day medical supervision in concert with the patient's rehab needs make it unreasonable for this patient to be served in a less intensive setting? Yes  14. Co-Morbidities requiring supervision/potential complications: CAD (cont meds), HTN (monitor and provide prns in accordance with increased physical exertion and pain), right hip hemiarthroplasty (cont hip precautions), ABLA (transfuse if necessary to ensure appropriate perfusion for increased activity tolerance), DM (Monitor in accordance with exercise and adjust meds as necessary), AKI (avoid nephrotoxic meds), post-op pain (Biofeedback training with therapies to help reduce reliance on opiate pain medications, monitor pain control during therapies, and sedation at rest and titrate to maximum efficacy to ensure participation and gains in therapies) 2. Due to safety, skin/wound care, disease management, pain management and patient education, does the patient require 24 hr/day rehab nursing? Yes 3. Does the patient require coordinated care of a physician, rehab nurse, PT (1-2 hrs/day, 5 days/week) and OT (1-2 hrs/day, 5 days/week) to address physical and functional deficits in the context of the above medical diagnosis(es)?  Yes Addressing deficits in the following areas: balance, endurance, locomotion, strength, transferring, bathing, dressing, toileting and psychosocial support 4. Can the patient actively participate in an intensive therapy program of at least 3 hrs of therapy per day at least 5 days per week? Potentially 5. The potential for patient to make measurable gains while on inpatient rehab is excellent 6. Anticipated functional outcomes upon discharge from inpatient rehab are mod assist  with PT, mod assist with OT, n/a with SLP. 7. Estimated rehab length of stay to reach the above functional goals is: 15-19 days. 8. Anticipated D/C setting: Home 9. Anticipated post D/C treatments: HH therapy and Home excercise program 10. Overall Rehab/Functional Prognosis: good and fair  RECOMMENDATIONS: This patient's condition is appropriate for continued rehabilitative care in the following setting: CIR to decrease burden of care, however, pt prefers to go to a facility in [place].  Patient has agreed to participate in recommended program. Potentially Note that insurance prior authorization may be required for reimbursement for recommended care.  Comment: Rehab Admissions Coordinator to follow up.  Delice Lesch, MD, ABPMR Bary Leriche, PA-C 05/28/2017          Revision History                             Routing History

## 2017-05-31 NOTE — Progress Notes (Signed)
Patient information reviewed and entered into eRehab system by Daiva Nakayama, RN, CRRN, [place] Coordinator.  Information including medical coding and functional independence measure will be reviewed and updated through discharge.     Per nursing patient/daughter was given "Data Collection Information Summary for Patients in Inpatient Rehabilitation Facilities with attached "Privacy Act [place] Records" upon admission.

## 2017-05-31 NOTE — Progress Notes (Signed)
Patient was admitted to the floor after 1900. She is alert, oriented and responsive. Her daughter is at bedside. IV site noted to the right arm. No acute distress noted. Was given in report that patient has a order for 2 units of packed red blood cells from the previous floor. Order had not crossed over on the admission. On call provider was called. Was called back & told by on call provider that the physicians assitant for the patient stated that it was ok to give the blood. Did obtain the blood from the blood bank, but it would not scan. Called the previous charge nurse & was told to put the order in because it was not showing on the [facility]. After placing the order, it still did not show to be able to scan. Called the house supervisor & was told to release the blood order. Order was released again, but the order still did not show. Took the blood down & had to take the unit back to the blood bank. Obtained another unit & asked the blood bank for assistance. Was told to call [facility] for issues with Epic. Did call [facility], information was given & they stated that they would call back. Call was returned & was told where I could manually put in the blood information, but more than 30 minutes had past again & had to return the blood. 3rd unit was obtained, checked by 2 RNs & hung. Patient is resting, no reactions at this time. Will continue to monitor.

## 2017-06-01 ENCOUNTER — Inpatient Hospital Stay (HOSPITAL_COMMUNITY): Payer: Medicare Other

## 2017-06-01 ENCOUNTER — Inpatient Hospital Stay (HOSPITAL_COMMUNITY): Payer: Medicare Other | Admitting: Occupational Therapy

## 2017-06-01 ENCOUNTER — Inpatient Hospital Stay (HOSPITAL_COMMUNITY): Payer: Medicare Other | Admitting: Physical Therapy

## 2017-06-01 DIAGNOSIS — E8809 Other disorders of plasma-protein metabolism, not elsewhere classified: Secondary | ICD-10-CM

## 2017-06-01 DIAGNOSIS — E46 Unspecified protein-calorie malnutrition: Secondary | ICD-10-CM

## 2017-06-01 LAB — TYPE AND SCREEN
ABO/RH(D): O NEG
Antibody Screen: NEGATIVE
Unit division: 0
Unit division: 0
Unit division: 0
Unit division: 0

## 2017-06-01 LAB — BPAM RBC
Blood Product Expiration Date: 201902062359
Blood Product Expiration Date: 201902062359
Blood Product Expiration Date: 201902062359
Blood Product Expiration Date: 201902062359
ISSUE DATE / TIME: 201901302052
ISSUE DATE / TIME: 201901302156
ISSUE DATE / TIME: 201901302304
ISSUE DATE / TIME: 201901310222
Unit Type and Rh: 9500
Unit Type and Rh: 9500
Unit Type and Rh: 9500
Unit Type and Rh: 9500

## 2017-06-01 LAB — GLUCOSE, CAPILLARY
Glucose-Capillary: 182 mg/dL — ABNORMAL HIGH (ref 65–99)
Glucose-Capillary: 278 mg/dL — ABNORMAL HIGH (ref 65–99)
Glucose-Capillary: 219 mg/dL — ABNORMAL HIGH (ref 65–99)
Glucose-Capillary: 172 mg/dL — ABNORMAL HIGH (ref 65–99)

## 2017-06-01 NOTE — Progress Notes (Signed)
Social Work  Social Work Assessment and Plan  Patient Details  Name: Maureen Morales MRN: [id number] Date of Birth: [birth year]  Today's Date: 06/01/2017  Problem List:  Patient Active Problem List   Diagnosis Date Noted  . Hypoalbuminemia due to protein-calorie malnutrition ([place])   . Intermittent asthma without complication   . Stage 3 chronic kidney disease ([place])   . Chronic diastolic congestive heart failure ([place])   . Periprosthetic fracture around internal prosthetic right hip joint ([place]) 05/30/2017  . Pressure injury of right heel, unstageable ([place])   . Anal fistula   . History of right hip hemiarthroplasty   . Benign essential HTN   . Acute blood loss anemia   . Diabetes mellitus type 2 in nonobese (HCC)   . AKI (acute kidney injury) ([place])   . Post-operative pain   . Fall 05/26/2017  . Closed right hip fracture ([place]) 05/26/2017  . Normocytic anemia 05/26/2017  . Abdominal distention 05/26/2017  . CAD (coronary artery disease) 05/26/2017  . Depression   . HLD (hyperlipidemia)   . Essential hypertension   . 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 Closed displaced fracture of right femoral neck ([place]) 02/16/2017  . Abnormal CT scan, colon   . Generalized anxiety disorder 09/07/2016  . Unsteady gait 09/07/2016  . NSTEMI (non-ST elevated myocardial infarction) ([place]) 08/30/2016  . Pain in joint, ankle and foot 06/24/2014  . S/P laparoscopic cholecystectomy 12/15/2013  . Preoperative general physical examination 12/15/2013  . Encounter for preventive health examination 11/13/2013  . Cervical spine disease 01/27/2013  . Anemia, iron deficiency 01/21/2013  . Right medial knee pain 01/09/2013  . Back pain, thoracic 08/27/2012  . Aortic stenosis, mild 05/06/2012  . Asthma 07/04/2011  . Hemorrhoids, internal 04/05/2011  . Nummular eczema 01/16/2011  . Essential hypertension   . Type II diabetes mellitus with nephropathy ([place])   .  Hyperlipidemia    Past Medical History:  Past Medical History:  Diagnosis Date  . Asthma without status asthmaticus    unspecified  . CAD (coronary artery disease)   . Colitis    unspecified  . Complication of anesthesia    sensitive to anesthesia  . Depression   . Diabetes mellitus   . Diabetes mellitus type 2, uncomplicated ([place])   . Essential hypertension   . GERD (gastroesophageal reflux disease)   . 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 HLD (hyperlipidemia)   . Hyperlipidemia    unspecified  . Hypertension   . Myocardial infarction ([place]) 2018  . Peripheral vascular disease ([place])   . Positive H. pylori test   . Viral gastroenteritis due to [place]-like agent Nov 2012   Past Surgical History:  Past Surgical History:  Procedure Laterality Date  . ABDOMINAL HYSTERECTOMY    . BLADDER SURGERY    . CARDIAC CATHETERIZATION    . CHOLECYSTECTOMY    . COLONOSCOPY     06/16/1987, 02/20/1995, 05/04/1999, 02/14/2005, 05/17/2007  . ESOPHAGOGASTRODUODENOSCOPY     05/11/1987, 06/13/1995, 10/09/1995, 05/04/1999, 01/16/2002  . FLEXIBLE SIGMOIDOSCOPY N/A 05/02/2017   Procedure: FLEXIBLE SIGMOIDOSCOPY;  Surgeon: Manya Silvas, MD;  Location: [facility] ENDOSCOPY;  Service: Endoscopy;  Laterality: N/A;  . HEMORRHOID SURGERY N/A 05/14/2017   Procedure: EXTERNAL THROMBOSED HEMORRHOIDECTOMY;  Surgeon: Robert Bellow, MD;  Location: [facility] ORS;  Service: General;  Laterality: N/A;  . HIP ARTHROPLASTY Right 02/17/2017   Procedure: ARTHROPLASTY BIPOLAR HIP (HEMIARTHROPLASTY);  Surgeon: Claud Kelp,  MD;  Location: [facility] ORS;  Service: Orthopedics;  Laterality: Right;  . LEFT HEART CATH AND CORONARY ANGIOGRAPHY N/A 08/31/2016   Procedure: Left Heart Cath and Coronary Angiography;  Surgeon: Wellington Hampshire, MD;  Location: [place] CV LAB;  Service: Cardiovascular;  Laterality: N/A;  . ORIF PERIPROSTHETIC FRACTURE Right 05/27/2017  . ORIF PERIPROSTHETIC FRACTURE Right 05/27/2017    Procedure: OPEN REDUCTION INTERNAL FIXATION (ORIF) PERIPROSTHETIC FRACTURE;  Surgeon: Mcarthur Rossetti, MD;  Location: [place];  Service: Orthopedics;  Laterality: Right;  . TOTAL HIP ARTHROPLASTY Right   . uterian prolapse     Social History:  reports that  has never smoked. she has never used smokeless tobacco. She reports that she does not drink alcohol or use drugs.  Family / Support Systems Marital Status: Married Patient Roles: Spouse, Parent Spouse/Significant Other: spouse is 12 yrs old and requires 24/7 assistance Children: daughter, Maureen Morales @ [phone];  son, Maureen Morales @ (C) [phone] and 2 more adult children in the area Other Supports: private caregivers and housekeeper Anticipated Caregiver: Pt and daughter report, if pt able to d/c home, then private duty caregivers will assist 24/7.   Ability/Limitations of Caregiver: Daughter works but patient has had a private duty caregivers (2) who provide assist to spouse and pt Caregiver Availability: 24/7 Family Dynamics: Family very supportive as well as having hired Chief Strategy Officer.  Social History Preferred language: English Religion: Christian Cultural Background: NA Read: Yes Write: Yes Employment Status: Retired Freight forwarder Issues: None Guardian/Conservator: None - per MD, pt is capable of making decisions on her own behalf.   Abuse/Neglect Abuse/Neglect Assessment Can Be Completed: Yes Physical Abuse: Denies Verbal Abuse: Denies Sexual Abuse: Denies Exploitation of patient/patient's resources: Denies Self-Neglect: Denies  Emotional Status Pt's affect, behavior adn adjustment status: Pt very pleasant and able to complete interview without any difficulty.  She admits frustration with fall and fx as she was "close to being let go by the doctor..." from her original hip sugery in October.  Denies any significant emotional distress.  Hopeful she will be able to d/c directly home. Recent  Psychosocial Issues: Hip surgery in Oct and d/c'd to [facility]. Pyschiatric History: none Substance Abuse History: none  Patient / Family Perceptions, Expectations & Goals Pt/Family understanding of illness & functional limitations: Pt and family with good understanding of her injury, surgery performed and current functional limitations/ need for CIR. Premorbid pt/family roles/activities: Was independent with rw in the home. Anticipated changes in roles/activities/participation: Anticipate caregivers will resume prior level of assist. Pt/family expectations/goals: "I hope I can go directly home."  [place] Agencies: None Premorbid Home Care/DME Agencies: Other (Comment)([facility] following SNF d/c) Transportation available at discharge: yes  Discharge Planning Living Arrangements: Spouse/significant other, Other (Comment)(private duty caregivers) Support Systems: Spouse/significant other, Children, Friends/neighbors, Home care staff Type of Residence: Private residence Insurance Resources: [employer], Multimedia programmer (specify) Financial Resources: [place] Referred: No Living Expenses: Own Money Management: Family Does the patient have any problems obtaining your medications?: No Patient/Family Preliminary Plans: Pt either d/c home with private caregivers vs possible short-term SNF Expected length of stay: 14-17 days  Clinical Impression Very pleasant, elderly woman here following a fall at home and suffering periprosthetic hip fx.  Admits frustration with fall as she feels she was making a good recovery from original THR in October 2018.  She has a good understanding of therapies and hopeful she might make enough progress that she could d/c directly home.  Of note, there are several caregivers who rotate to provide 24/7 care to pt and her 63yo spouse in the home.  They do have an option of short term SNF at [facility] if this is needed.   Will follow for support and d/c planning needs.  Longino Trefz 06/01/2017, 3:29 PM

## 2017-06-01 NOTE — Progress Notes (Signed)
[place] PHYSICAL MEDICINE & REHABILITATION     PROGRESS NOTE  Subjective/Complaints:  Maureen Morales seen lying in bed this morning. Maureen Morales, however, per daughter, Maureen Morales. Maureen Morales states Maureen Morales had a really good first in therapies, but was exhausted by the end.  ROS: Denies CP, SOB, nausea, vomiting, diarrhea.  Objective: Vital Signs: Blood pressure (!) 168/60, pulse 80, temperature 98.1 F (36.7 C), temperature source Oral, resp. rate 16, height 5\' 1"  (1.549 m), weight 63.7 kg (140 lb 6.9 oz), SpO2 98 %. Dg Chest Port 1 View  Result Date: 05/30/2017 CLINICAL DATA:  Fever EXAM: PORTABLE CHEST 1 VIEW COMPARISON:  None. FINDINGS: 1300 hours. Lungs are hyperexpanded. The lungs are clear without focal pneumonia, edema, pneumothorax or pleural effusion. Interstitial markings are diffusely coarsened with chronic features. The cardio pericardial silhouette is enlarged. The visualized bony structures of the thorax are intact. IMPRESSION: Emphysema with chronic interstitial coarsening. No acute cardiopulmonary findings. Electronically Signed   By: Misty Stanley M.D.   On: 05/30/2017 13:38   Recent Labs    05/30/17 0531 05/31/17 0940  WBC 9.4 8.0  HGB 7.0* 10.5*  HCT 20.8* 31.3*  PLT 229 273   Recent Labs    05/31/17 0940  NA 138  K 3.7  CL 104  GLUCOSE 270*  BUN 27*  CREATININE 1.18*  CALCIUM 8.8*   CBG (last 3)  Recent Labs    05/31/17 1646 05/31/17 2026 06/01/17 0625  GLUCAP 239* 206* 182*    Wt Readings from Last 3 Encounters:  05/31/17 63.7 kg (140 lb 6.9 oz)  05/22/17 57.2 kg (126 lb)  05/14/17 58.1 kg (128 lb)    Physical Exam:  BP (!) 168/60 (BP Location: Right Arm)   Pulse 80   Temp 98.1 F (36.7 C) (Oral)   Resp 16   Ht 5\' 1"  (1.549 m)   Wt 63.7 kg (140 lb 6.9 oz)   SpO2 98%   BMI 26.53 kg/m  Constitutional: Maureen Morales appears well-developed and well-nourished. No distress.  HENT: Normocephalic and atraumatic.   Eyes: EOM are normal. No discharge.  Cardiovascular: RRR. Murmur heard. No JVD. Respiratory: Effort normal. Clear.  GI: Bowel sounds are normal. Maureen Morales exhibits no distension.  Musculoskeletal: Edema right hip and thigh  Neurological: Maureen Morales is alert and oriented.  Motor: B/l UE: 4/5 proximal to distal RLE: HF, 3-/5, ADF/PF 4-/5  LLE: HF, KE 4-/5, ADF/PF 4+/5  Skin: Dry surgical dressing in place. Right heel with fluctuant blister.   Psychiatric: Maureen Morales has a normal mood and affect. Maureen Morales behavior is normal.    Assessment/Plan: 1. Functional deficits secondary to right peri-prosthetic hip fracture which require 3+ hours per day of interdisciplinary therapy in a comprehensive inpatient rehab setting. Physiatrist is providing close team supervision and 24 hour management of active medical problems listed below. Physiatrist and rehab team continue to assess barriers to discharge/monitor Maureen Morales progress toward functional and medical goals.  Function:  Bathing Bathing position Bathing activity did not occur: Safety/medical concerns    Bathing parts      Bathing assist        Upper Body Dressing/Undressing Upper body dressing   What is the Maureen Morales wearing?: Hospital gown                Upper body assist Assist Level: Touching or steadying assistance(Pt > 75%)      Lower Body Dressing/Undressing Lower body dressing Lower body dressing/undressing activity did not  occur: Safety/medical concerns                                Lower body assist        Toileting Toileting Toileting activity did not occur: Safety/medical concerns   Toileting steps completed by helper: Performs perineal hygiene, Adjust clothing prior to toileting, Adjust clothing after toileting Toileting Assistive Devices: Grab bar or rail  Toileting assist Assist level: Touching or steadying assistance (Pt.75%)   Transfers Chair/bed transfer Chair/bed transfer activity did not occur: Safety/medical  concerns(Hg 7.0 - medically unstable)           Locomotion Ambulation Ambulation activity did not occur: Safety/medical concerns(Hg 7.0 - medically unstable)         Wheelchair Wheelchair activity did not occur: Safety/medical concerns(Hg 7.0 - medically unstable) Type: Manual      Cognition Comprehension Comprehension assist level: Follows complex conversation/direction with no assist  Expression Expression assist level: Expresses complex ideas: With no assist  Social Interaction Social Interaction assist level: Interacts appropriately with others - No medications needed.  Problem Solving Problem solving assist level: Solves complex problems: Recognizes & self-corrects  Memory Memory assist level: Assistive device: No helper    Medical Problem List and Plan: 1.  Deficits with mobility, transfers, and self-care secondary to right peri-prosthetic hip fracture.    Continue CIR 2.  DVT Prophylaxis/Anticoagulation: Mechanical: Sequential compression devices, below knee Bilateral lower extremities--no blood thinners due to history of hematochezia.    Vascular study negative for DVT on 1/31 3. Pain Management: oxycodone prn 4. Mood: team to provide ego support to help manage pain and anxiety 5. Neuropsych: This Maureen Morales. 6. Skin/Wound Care: Monitor scalp wound, anal wound and right hip incision for healing.  7. Fluids/Electrolytes/Nutrition: Monitor I/Os.  8. ABLA:    Post transfusion hemoglobin 10.5 on 1/31   Labs ordered for Monday 9. HTN: Monitor BP bid--continue Toprol XL and Cozaar.   Gradually stabilizing after transfusion   Will continue to monitor and consider medication adjustments if necessary 10. T2DM: Hgb A1C-7.0. BS    Started on Lantus. Continue for now and transition to oral medication as mobility improves.    Improving, will consider further adjustments after stabilization if necessary 11. H/o Asthma: Scheduled nebs  qid for now. 12. Right heel ulcer: Ordered prevalon boot. Foam dressing. Added protein supplement to help with wound healing.  13. Recent Anal fistulotomy: Keep area clean and dry.  14. Chronic renal failure?: Baseline SCr around 1.2?    Creatinine 1.18 on 1/31   Labs ordered for Monday 15. Right hemiarthroplasty: Maureen Morales is to continue right total hip precautions as well as  NWB due to recent surgery. .  16. CAD with moderate aortic stenosis/Grade 2 diastolic dysfunction: Monitor daily weights. On lipitor, Cozaar and Toprol. Avoid overload.  Filed Weights   05/30/17 2026 05/31/17 0500  Weight: 65 kg (143 lb 4.8 oz) 63.7 kg (140 lb 6.9 oz)  17. Hypoalbuminemia  Supplement initiated  LOS (Days) 2 A FACE TO FACE EVALUATION WAS PERFORMED  Johnatha Zeidman Lorie Phenix 06/01/2017 8:35 AM

## 2017-06-01 NOTE — Progress Notes (Signed)
Physical Therapy Note  Patient Details  Name: Maureen Morales MRN: [id number] Date of Birth: 01/29/1923 Today's Date: 06/01/2017    Time: 1300-1402 62 minutes  1:1 Pt c/o Rt hip pain 5/10, states she took tylenol prior to session.  Blocked practice of stand pivot transfers with max A to stand, mod/max A to pivot x 10 transfers during session.  Sit to stand blocked practice x 5 with RW and mod/max A.  Supine Rt LE therex with 2 x 15 heel slides, ankle pumps, SAQ, hip abd/add, quad sets. Pt back to bed due to incontinence of bowel, pt performs rolling for changing with min A and use of bed rails, pt left in bed with nursing present to dress wounds.   DONAWERTH,KAREN 06/01/2017, 2:05 PM

## 2017-06-01 NOTE — Progress Notes (Signed)
Occupational Therapy Session Note  Patient Details  Name: Maureen Morales MRN: [id number] Date of Birth: 12-08-1922  Today's Date: 06/01/2017 OT Individual Time: 1100-1200 OT Individual Time Calculation (min): 60 min    Short Term Goals: Week 1:  OT Short Term Goal 1 (Week 1): Pt will perform toilet transfer with mod A. OT Short Term Goal 2 (Week 1): Pt will perform LB dressing with mod A. OT Short Term Goal 3 (Week 1): Pt will engage in 15 minutes of functional task with less than 2 rest breaks needed.   Skilled Therapeutic Interventions/Progress Updates:    1:1. Pt requesting to toilet upon entering room and no c/o pain. Pt stand pivot transfer throughout session recliner>w/c<>toilet; w/c<>EOM with VC for hand placement, WB precautions and MOD A for lift/pivot. Pt often attempts to sit early requiring cues for squaring up with chair. Pt completes toileting with MAX A fof clothing management. Pt able to complete peri care in standing with MOD A for balance. Pt propels w/c with Vc for steering/obstacle avoidance and attention to task as pt says "hello" to ever passerby. Pt completes standing balacne/tolerance activities with min A for balance (connect four; card matching) with VC for weight shifting/WB precautions as pt tends to place R toes down. Exited session with pt seated in w/c with daughter present and all needs met.  Therapy Documentation Precautions:  Precautions Precautions: Fall, Posterior Hip Precaution Comments: pt recalls 2/3 posterior hip precautions; sign posted in room Restrictions Weight Bearing Restrictions: Yes RLE Weight Bearing: Non weight bearing General:    See Function Navigator for Current Functional Status.   Therapy/Group: Individual Therapy  [place] 06/01/2017, 12:08 PM

## 2017-06-01 NOTE — Progress Notes (Signed)
Occupational Therapy Session Note  Patient Details  Name: Maureen Morales MRN: [id number] Date of Birth: 02/05/1923  Today's Date: 06/01/2017 OT Individual Time: 0254-2706 OT Individual Time Calculation (min): 60 min    Short Term Goals: Week 1:  OT Short Term Goal 1 (Week 1): Pt will perform toilet transfer with mod A. OT Short Term Goal 2 (Week 1): Pt will perform LB dressing with mod A. OT Short Term Goal 3 (Week 1): Pt will engage in 15 minutes of functional task with less than 2 rest breaks needed.   Skilled Therapeutic Interventions/Progress Updates:    Pt received in bed with dtr in the room. Pt eager to work on B/D.  She thought she needed to toilet so BSC brought out to pt. Pt needed mod A to come to EOB.  Pt discussed how difficult it was for her to pivot yesterday in the sock. Only placed yellow sock on R foot and then shoe on L for visual cue for wt bearing.  Cued pt with visual demonstration how to lift heel slightly and pivot on ball of foot.  Pt then stood up using B hands to push up and then reach for walker with mod A maintaining wt bearing off R leg.  Using the walker, she followed directions well to pivot with mod A to BSC.  From [facility] pt worked on UB self care with s/u and LB care with max A. She was able to use the reacher to don pants over feet with 50% A. She will need general practice with use of the reacher.  Pt did not need to toilet at the time so pivoted back to w/c.  Pt set up with leg rests and reviewed OT POC. At end of session, pt stated she needed to toilet. Called her nurse tech in to assist.  Therapy Documentation Precautions:  Precautions Precautions: Fall, Posterior Hip Precaution Comments: pt recalls 2/3 posterior hip precautions; sign posted in room Restrictions Weight Bearing Restrictions: Yes RLE Weight Bearing: Non weight bearing  Pain: Pain Assessment Pain Score: 4  - R hip - received meds from nursing  ADL:  See Function Navigator for Current  Functional Status.   Therapy/Group: Individual Therapy  [place] 06/01/2017, 12:19 PM

## 2017-06-01 NOTE — Care Management Note (Signed)
[facility] Individual Statement of Services  Patient Name:  Maureen Morales  Date:  06/01/2017  Welcome to the [facility].  Our goal is to provide you with an individualized program based on your diagnosis and situation, designed to meet your specific needs.  With this comprehensive rehabilitation program, you will be expected to participate in at least 3 hours of rehabilitation therapies Monday-Friday, with modified therapy programming on the weekends.  Your rehabilitation program will include the following services:  Physical Therapy (PT), Occupational Therapy (OT), 24 hour per day rehabilitation nursing, Therapeutic Recreaction (TR), Case Management (Social Worker), Rehabilitation Medicine, Nutrition Services and Pharmacy Services  Weekly team conferences will be held on Wednesdays to discuss your progress.  Your Social Worker will talk with you frequently to get your input and to update you on team discussions.  Team conferences with you and your family in attendance may also be held.  Expected length of stay: 14-17 days    Overall anticipated outcome: minimal assistance  Depending on your progress and recovery, your program may change. Your Social Worker will coordinate services and will keep you informed of any changes. Your Social Worker's name and contact numbers are listed  below.  The following services may also be recommended but are not provided by the [facility] will be made to provide these services after discharge if needed.  Arrangements include referral to agencies that provide these services.  Your insurance has been verified to be:  Medicare and [place] Your primary doctor is:  [place]  Pertinent information will be shared with your doctor and your insurance company.  Social Worker:  [place], [place] or [phone]   Information discussed with and copy given to patient by: Lennart Pall, 06/01/2017, 3:32 PM

## 2017-06-02 DIAGNOSIS — M9701XD Periprosthetic fracture around internal prosthetic right hip joint, subsequent encounter: Secondary | ICD-10-CM

## 2017-06-02 LAB — GLUCOSE, CAPILLARY
GLUCOSE-CAPILLARY: 121 mg/dL — AB (ref 65–99)
GLUCOSE-CAPILLARY: 218 mg/dL — AB (ref 65–99)
Glucose-Capillary: 212 mg/dL — ABNORMAL HIGH (ref 65–99)
Glucose-Capillary: 146 mg/dL — ABNORMAL HIGH (ref 65–99)

## 2017-06-02 NOTE — Progress Notes (Signed)
[facility] PHYSICAL MEDICINE & REHABILITATION     PROGRESS NOTE  Subjective/Complaints:   No issues overnight.  Denies any bowel or bladder problems.  Asking about the right heel blister.  Also about her right lower extremity edema.  She has no shortness of breath.  She has no lower extremity pains ROS: Denies CP, SOB, nausea, vomiting, diarrhea.  Objective: Vital Signs: Blood pressure (!) 160/57, pulse 84, temperature 98.2 F (36.8 C), temperature source Oral, resp. rate 18, height 5\' 1"  (1.549 m), weight 62.2 kg (137 lb 3.2 oz), SpO2 98 %. No results found. Recent Labs    05/31/17 0940  WBC 8.0  HGB 10.5*  HCT 31.3*  PLT 273   Recent Labs    05/31/17 0940  NA 138  K 3.7  CL 104  GLUCOSE 270*  BUN 27*  CREATININE 1.18*  CALCIUM 8.8*   CBG (last 3)  Recent Labs    06/01/17 1620 06/01/17 2143 06/02/17 0709  GLUCAP 219* 172* 121*    Wt Readings from Last 3 Encounters:  06/02/17 62.2 kg (137 lb 3.2 oz)  05/22/17 57.2 kg (126 lb)  05/14/17 58.1 kg (128 lb)    Physical Exam:  BP (!) 160/57 (BP Location: Right Arm)   Pulse 84   Temp 98.2 F (36.8 C) (Oral)   Resp 18   Ht 5\' 1"  (1.549 m)   Wt 62.2 kg (137 lb 3.2 oz)   SpO2 98%   BMI 25.92 kg/m  Constitutional: She appears well-developed and well-nourished. No distress.  HENT: Normocephalic and atraumatic.  Eyes: EOM are normal. No discharge.  Cardiovascular: RRR. Murmur heard. No JVD. Respiratory: Effort normal. Clear.  GI: Bowel sounds are normal. She exhibits no distension.  Musculoskeletal: Edema right hip and thigh, 1+ pitting at the dorsum of the right foot Neurological: She is alert and oriented.  Motor: B/l UE: 4/5 proximal to distal RLE: HF, 3-/5, ADF/PF 4-/5  LLE: HF, KE 4-/5, ADF/PF 4+/5  Skin: Dry surgical dressing in place. Right heel with fluctuant blister.  No surrounding erythema  Psychiatric: She has a normal mood and affect. Her behavior is normal.    Assessment/Plan: 1. Functional  deficits secondary to right peri-prosthetic hip fracture which require 3+ hours per day of interdisciplinary therapy in a comprehensive inpatient rehab setting. Physiatrist is providing close team supervision and 24 hour management of active medical problems listed below. Physiatrist and rehab team continue to assess barriers to discharge/monitor patient progress toward functional and medical goals.  Function:  Bathing Bathing position Bathing activity did not occur: Safety/medical concerns Position: Other (comment)(sitting on BSC)  Bathing parts Body parts bathed by patient: Right arm, Left arm, Chest, Abdomen, Front perineal area, Right upper leg, Left upper leg Body parts bathed by helper: Right lower leg, Left lower leg, Back, Buttocks  Bathing assist        Upper Body Dressing/Undressing Upper body dressing   What is the patient wearing?: Button up shirt         Button up shirt - Perfomed by patient: Thread/unthread right sleeve, Thread/unthread left sleeve, Pull shirt around back, Button/unbutton shirt      Upper body assist Assist Level: Set up      Lower Body Dressing/Undressing Lower body dressing Lower body dressing/undressing activity did not occur: Safety/medical concerns What is the patient wearing?: Underwear, Pants, Non-skid slipper socks, Shoes   Underwear - Performed by helper: Thread/unthread right underwear leg, Thread/unthread left underwear leg, Pull underwear up/down(pt used reacher  with 50% help)   Pants- Performed by helper: Thread/unthread right pants leg, Thread/unthread left pants leg, Pull pants up/down(Pt used reacher with 50% help)   Non-skid slipper socks- Performed by helper: Don/doff right sock(R foot only)       Shoes - Performed by helper: Don/doff left shoe(L foot only)          Lower body assist        Toileting Toileting Toileting activity did not occur: Safety/medical concerns Toileting steps completed by patient: Adjust clothing  prior to toileting Toileting steps completed by helper: Adjust clothing prior to toileting, Performs perineal hygiene, Adjust clothing after toileting Toileting Assistive Devices: Grab bar or rail  Toileting assist Assist level: Touching or steadying assistance (Pt.75%)   Transfers Chair/bed transfer Chair/bed transfer activity did not occur: Safety/medical concerns(Hg 7.0 - medically unstable) Chair/bed transfer method: Stand pivot Chair/bed transfer assist level: Maximal assist (Pt 25 - 49%/lift and lower) Chair/bed transfer assistive device: Medical sales representative Ambulation activity did not occur: Safety/medical concerns(Hg 7.0 - medically unstable)         Wheelchair Wheelchair activity did not occur: Safety/medical concerns(Hg 7.0 - medically unstable) Type: Manual      Cognition Comprehension Comprehension assist level: Follows complex conversation/direction with no assist  Expression Expression assist level: Expresses complex ideas: With no assist  Social Interaction Social Interaction assist level: Interacts appropriately with others - No medications needed.  Problem Solving Problem solving assist level: Solves complex problems: Recognizes & self-corrects  Memory Memory assist level: Assistive device: No helper    Medical Problem List and Plan: 1.  Deficits with mobility, transfers, and self-care secondary to right peri-prosthetic hip fracture.    Continue CIR PT, OT 2.  DVT Prophylaxis/Anticoagulation: Mechanical: Sequential compression devices, below knee Bilateral lower extremities--no blood thinners due to history of hematochezia.    Vascular study negative for DVT on 1/31 3. Pain Management: oxycodone prn 4. Mood: team to provide ego support to help manage pain and anxiety 5. Neuropsych: This patient is capable of making decisions on her own behalf. 6. Skin/Wound Care: Monitor scalp wound, anal wound and right hip incision for healing.  7.  Fluids/Electrolytes/Nutrition: Monitor I/Os.  8. ABLA:    Post transfusion hemoglobin 10.5 on 1/31   Labs ordered for Monday 06/04/2017 9. HTN: Monitor BP bid--continue Toprol XL and Cozaar.    Vitals:   06/01/17 1358 06/02/17 0116  BP: (!) 149/57 (!) 160/57  Pulse: 76 84  Resp: 18 18  Temp: 98.5 F (36.9 C) 98.2 F (36.8 C)  SpO2: 100% 98%  No change in medications today, monitor still some lability 10. T2DM: Hgb A1C-7.0. BS    Started on Lantus. Continue for now and transition to oral medication as mobility improves.     CBG (last 3)  Recent Labs    06/01/17 1620 06/01/17 2143 06/02/17 0709  GLUCAP 219* 172* 121*  No change in medications at this point 11. H/o Asthma: Scheduled nebs qid for now. 12. Right heel ulcer: Ordered prevalon boot. Foam dressing. Added protein supplement to help with wound healing.  13. Recent Anal fistulotomy: Keep area clean and dry.  14. Chronic renal failure?: Baseline SCr around 1.2?    Creatinine 1.18 on 1/31   Labs ordered for Monday 15. Right hemiarthroplasty: She is to continue right total hip precautions as well as  NWB due to recent surgery. .  16. CAD with moderate aortic stenosis/Grade 2 diastolic dysfunction: Monitor daily weights.  On lipitor, Cozaar and Toprol. Avoid overload.  Filed Weights   05/30/17 2026 05/31/17 0500 06/02/17 0116  Weight: 65 kg (143 lb 4.8 oz) 63.7 kg (140 lb 6.9 oz) 62.2 kg (137 lb 3.2 oz)  17. Hypoalbuminemia  Supplement initiated  LOS (Days) 3 A FACE TO FACE EVALUATION WAS PERFORMED  Maureen Morales 06/02/2017 7:41 AM

## 2017-06-03 ENCOUNTER — Inpatient Hospital Stay (HOSPITAL_COMMUNITY): Payer: Medicare Other

## 2017-06-03 ENCOUNTER — Inpatient Hospital Stay (HOSPITAL_COMMUNITY): Payer: Medicare Other | Admitting: Physical Therapy

## 2017-06-03 LAB — GLUCOSE, CAPILLARY
GLUCOSE-CAPILLARY: 195 mg/dL — AB (ref 65–99)
GLUCOSE-CAPILLARY: 244 mg/dL — AB (ref 65–99)
Glucose-Capillary: 141 mg/dL — ABNORMAL HIGH (ref 65–99)
Glucose-Capillary: 118 mg/dL — ABNORMAL HIGH (ref 65–99)

## 2017-06-03 MED ORDER — METOPROLOL SUCCINATE ER 50 MG PO TB24
75.0000 mg | ORAL_TABLET | Freq: Every day | ORAL | Status: DC
  Administered 2017-06-03 – 2017-06-13 (×11): 75 mg via ORAL
  Filled 2017-06-03 (×11): qty 1

## 2017-06-03 NOTE — Progress Notes (Signed)
Occupational Therapy Session Note  Patient Details  Name: Maureen Morales MRN: [id number] Date of Birth: 02-12-1923  Today's Date: 06/03/2017 OT Individual Time: 3734-2876 OT Individual Time Calculation (min): 43 min    Short Term Goals: Week 1:  OT Short Term Goal 1 (Week 1): Pt will perform toilet transfer with mod A. OT Short Term Goal 2 (Week 1): Pt will perform LB dressing with mod A. OT Short Term Goal 3 (Week 1): Pt will engage in 15 minutes of functional task with less than 2 rest breaks needed.   Skilled Therapeutic Interventions/Progress Updates:    1:1. Pt requesting to toilet. Pt stand pivot transfer throughout session recliner<>w/c<>BSC with MOD A using RW with VC for hand placement and increased balance to pivot. Pt stands with MOD A for balance to advance pants down hips and complete hygiene in standing after B&B void with increased time. Pt requires A to advance pants up hips d/t leaning R despite cues for weight shifting. Pt c/o pain in R femur with elevated leg rest. OT adjusts leg rest for proper length and pt propels w/c in hallway to/from tx gym to improve manuevering around obstacles in hallway and improve BUE strength/endurance required for BADLs. Pt requires min cues for steering pattern and pt states, " I am better at bridge than driving this thing!" Pt reports leg rest fitting better. Exited session with pt seated in recliner with QRB donned and call light in reach.  Therapy Documentation Precautions:  Precautions Precautions: Fall, Posterior Hip Precaution Comments: pt recalls 2/3 posterior hip precautions; sign posted in room Restrictions Weight Bearing Restrictions: Yes RLE Weight Bearing: Non weight bearing  See Function Navigator for Current Functional Status.   Therapy/Group: Individual Therapy  Tonny Branch 06/03/2017, 4:14 PM

## 2017-06-03 NOTE — Progress Notes (Signed)
[facility] PHYSICAL MEDICINE & REHABILITATION     PROGRESS NOTE  Subjective/Complaints:  No issues overnite, some pain in R ant thigh no numbness or tingling  ROS: Denies CP, SOB, nausea, vomiting, diarrhea.  Objective: Vital Signs: Blood pressure (!) 168/61, pulse 83, temperature 97.9 F (36.6 C), temperature source Oral, resp. rate 18, height 5\' 1"  (1.549 m), weight 62.2 kg (137 lb 2 oz), SpO2 94 %. No results found. Recent Labs    05/31/17 0940  WBC 8.0  HGB 10.5*  HCT 31.3*  PLT 273   Recent Labs    05/31/17 0940  NA 138  K 3.7  CL 104  GLUCOSE 270*  BUN 27*  CREATININE 1.18*  CALCIUM 8.8*   CBG (last 3)  Recent Labs    06/02/17 1634 06/02/17 2109 06/03/17 0640  GLUCAP 212* 146* 141*    Wt Readings from Last 3 Encounters:  06/03/17 62.2 kg (137 lb 2 oz)  05/22/17 57.2 kg (126 lb)  05/14/17 58.1 kg (128 lb)    Physical Exam:  BP (!) 168/61 (BP Location: Left Arm)   Pulse 83   Temp 97.9 F (36.6 C) (Oral)   Resp 18   Ht 5\' 1"  (1.549 m)   Wt 62.2 kg (137 lb 2 oz)   SpO2 94%   BMI 25.91 kg/m  Constitutional: She appears well-developed and well-nourished. No distress.  HENT: Normocephalic and atraumatic.  Eyes: EOM are normal. No discharge.  Cardiovascular: RRR. Murmur heard. No JVD. Respiratory: Effort normal. Clear.  GI: Bowel sounds are normal. She exhibits no distension.  Musculoskeletal: Edema right hip and thigh, 1+ pitting at the dorsum of the right foot, no vs yesterday Neurological: She is alert and oriented.  Motor: B/l UE: 4/5 proximal to distal RLE: HF, 3-/5, ADF/PF 4-/5  LLE: HF, KE 4-/5, ADF/PF 4+/5  Skin: Dry surgical dressing in place. Right heel with fluctuant blister.  No surrounding erythema  Psychiatric: She has a normal mood and affect. Her behavior is normal.    Assessment/Plan: 1. Functional deficits secondary to right peri-prosthetic hip fracture which require 3+ hours per day of interdisciplinary therapy in a  comprehensive inpatient rehab setting. Physiatrist is providing close team supervision and 24 hour management of active medical problems listed below. Physiatrist and rehab team continue to assess barriers to discharge/monitor patient progress toward functional and medical goals.  Function:  Bathing Bathing position Bathing activity did not occur: Safety/medical concerns Position: Other (comment)(sitting on BSC)  Bathing parts Body parts bathed by patient: Right arm, Left arm, Chest, Abdomen, Front perineal area, Right upper leg, Left upper leg Body parts bathed by helper: Right lower leg, Left lower leg, Back, Buttocks  Bathing assist        Upper Body Dressing/Undressing Upper body dressing   What is the patient wearing?: Button up shirt         Button up shirt - Perfomed by patient: Thread/unthread right sleeve, Thread/unthread left sleeve, Pull shirt around back, Button/unbutton shirt      Upper body assist Assist Level: Set up      Lower Body Dressing/Undressing Lower body dressing Lower body dressing/undressing activity did not occur: Safety/medical concerns What is the patient wearing?: Underwear, Pants, Non-skid slipper socks, Shoes   Underwear - Performed by helper: Thread/unthread right underwear leg, Thread/unthread left underwear leg, Pull underwear up/down(pt used reacher with 50% help)   Pants- Performed by helper: Thread/unthread right pants leg, Thread/unthread left pants leg, Pull pants up/down(Pt used reacher with  50% help)   Non-skid slipper socks- Performed by helper: Don/doff right sock(R foot only)       Shoes - Performed by helper: Don/doff left shoe(L foot only)          Lower body assist        Toileting Toileting Toileting activity did not occur: Safety/medical concerns Toileting steps completed by patient: Adjust clothing prior to toileting Toileting steps completed by helper: Adjust clothing prior to toileting, Performs perineal hygiene,  Adjust clothing after toileting Toileting Assistive Devices: Grab bar or rail  Toileting assist Assist level: Touching or steadying assistance (Pt.75%)   Transfers Chair/bed transfer Chair/bed transfer activity did not occur: Safety/medical concerns(Hg 7.0 - medically unstable) Chair/bed transfer method: Stand pivot Chair/bed transfer assist level: Maximal assist (Pt 25 - 49%/lift and lower) Chair/bed transfer assistive device: Medical sales representative Ambulation activity did not occur: Safety/medical concerns(Hg 7.0 - medically unstable)         Wheelchair Wheelchair activity did not occur: Safety/medical concerns(Hg 7.0 - medically unstable) Type: Manual      Cognition Comprehension Comprehension assist level: Follows complex conversation/direction with no assist  Expression Expression assist level: Expresses complex ideas: With no assist  Social Interaction Social Interaction assist level: Interacts appropriately with others - No medications needed.  Problem Solving Problem solving assist level: Solves complex problems: Recognizes & self-corrects  Memory Memory assist level: Assistive device: No helper    Medical Problem List and Plan: 1.  Deficits with mobility, transfers, and self-care secondary to right peri-prosthetic hip fracture.    Continue CIR PT, OT 2.  DVT Prophylaxis/Anticoagulation: Mechanical: Sequential compression devices, below knee Bilateral lower extremities--no blood thinners due to history of hematochezia.    Vascular study negative for DVT on 1/31 3. Pain Management: oxycodone prn 4. Mood: team to provide ego support to help manage pain and anxiety 5. Neuropsych: This patient is capable of making decisions on her own behalf. 6. Skin/Wound Care: Monitor scalp wound, anal wound and right hip incision for healing.  7. Fluids/Electrolytes/Nutrition: Monitor I/Os.  8. ABLA:    Post transfusion hemoglobin 10.5 on 1/31   Labs ordered for Monday  06/04/2017 9. HTN: Monitor BP bid--continue Toprol XL and Cozaar.    Vitals:   06/02/17 1544 06/03/17 0045  BP: (!) 148/48 (!) 168/61  Pulse: 76 83  Resp: 18 18  Temp: 98 F (36.7 C) 97.9 F (36.6 C)  SpO2: 37% 85%  SYstolic elevation HR ok increase toprol XL 10. T2DM: Hgb A1C-7.0. BS    Started on Lantus. Continue for now and transition to oral medication as mobility improves.     CBG (last 3)  Recent Labs    06/02/17 1634 06/02/17 2109 06/03/17 0640  GLUCAP 212* 146* 141*  fair control 2/3 11. H/o Asthma: Scheduled nebs qid for now. 12. Right heel ulcer: Ordered prevalon boot. Foam dressing. Added protein supplement to help with wound healing.  13. Recent Anal fistulotomy: Keep area clean and dry.  14. Chronic renal failure?: Baseline SCr around 1.2?    Creatinine 1.18 on 1/31   Labs ordered for Monday 15. Right hemiarthroplasty: She is to continue right total hip precautions as well as  NWB due to recent surgery. .  16. CAD with moderate aortic stenosis/Grade 2 diastolic dysfunction: Monitor daily weights. On lipitor, Cozaar and Toprol. Avoid overload.  Filed Weights   05/31/17 0500 06/02/17 0116 06/03/17 0045  Weight: 63.7 kg (140 lb 6.9 oz) 62.2 kg (137  lb 3.2 oz) 62.2 kg (137 lb 2 oz)  17. Hypoalbuminemia  Supplement initiated  LOS (Days) 4 A FACE TO FACE EVALUATION WAS PERFORMED  Charlett Blake 06/03/2017 7:04 AM

## 2017-06-03 NOTE — Progress Notes (Signed)
Physical Therapy Session Note  Patient Details  Name: Maureen Morales MRN: [id number] Date of Birth: 1922-06-25  Today's Date: 06/03/2017 PT Individual Time: 1430-1500 PT Individual Time Calculation (min): 30 min   Short Term Goals: Week 1:  PT Short Term Goal 1 (Week 1): Pt will be able to perform functional bed <> chair transfers with mod assist PT Short Term Goal 2 (Week 1): Pt will be able to perform sit <> stand with mod assist wile maintaining NWB status PT Short Term Goal 3 (Week 1): Pt will be able to propel w/c x 50' with min assist  Skilled Therapeutic Interventions/Progress Updates:  Pt was seen bedside in the pm. Pt sitting up in recliner. Treatment focused on sit to stand transfers with rolling walker and mod A with verbal cues. Pt able to recall 3/3 precautions. While sitting in recliner pt performed LAQs and APs, 3 sets x 10 reps each. Pt left sitting up in recliner with quick release belt in place and all needs within reach.   Therapy Documentation Precautions:  Precautions Precautions: Fall, Posterior Hip Precaution Comments: pt recalls 2/3 posterior hip precautions; sign posted in room Restrictions Weight Bearing Restrictions: Yes RLE Weight Bearing: Non weight bearing General:   Pain: Pt c/o 4/10 pain R LE.   See Function Navigator for Current Functional Status.   Therapy/Group: Individual Therapy  Dub Amis 06/03/2017, 3:49 PM

## 2017-06-03 NOTE — Progress Notes (Signed)
Physical Therapy Session Note  Patient Details  Name: Maureen Morales MRN: [id number] Date of Birth: 12-09-1922  Today's Date: 06/03/2017 PT Individual Time: 0801-0900 PT Individual Time Calculation (min): 59 min   Short Term Goals: Week 1:  PT Short Term Goal 1 (Week 1): Pt will be able to perform functional bed <> chair transfers with mod assist PT Short Term Goal 2 (Week 1): Pt will be able to perform sit <> stand with mod assist wile maintaining NWB status PT Short Term Goal 3 (Week 1): Pt will be able to propel w/c x 50' with min assist  Skilled Therapeutic Interventions/Progress Updates:    Pt supine in bed upon PT arrival, agreeable to therapy tx and reports pain 5/10. Pt transferred from supine>sitting EOB with min assist for R LE management. Pt seated EOB performed upper and lower body dressing with assist to loop LEs through pants. Pt performed sit<>stand with mod assist and RW while therapist pulled pants over hips. Pt performed stand pivot transfer from bed>w/c with mod assist using RW. Pt transported to gym in w/c. Pt performed squat pivot transfer from w/c>mat with verbal cues for techniques, min assist. Pt transferred from sitting<>supine with min assist. Pt performed R LE therex for strengthening, 2 x 10 each: hip abduction, heel slides, SLR, and SAQ. Pt performed sit<>stand with RW and mod assist, worked on dynamic standing balance to perform card matching activity with UE. Pt propelled w/c x 60 ft using B UEs and supervision, limited by fatigue. Pt transferred from w/c>recliner stand pivot with RW and mod assist, left seated in recliner with needs in reach, QRB in place and chair alarm set.   Therapy Documentation Precautions:  Precautions Precautions: Fall, Posterior Hip Precaution Comments: pt recalls 2/3 posterior hip precautions; sign posted in room Restrictions Weight Bearing Restrictions: Yes RLE Weight Bearing: Non weight bearing   See Function Navigator for Current  Functional Status.   Therapy/Group: Individual Therapy  Netta Corrigan, PT, DPT 06/03/2017, 7:47 AM

## 2017-06-03 NOTE — Progress Notes (Signed)
Occupational Therapy Session Note  Patient Details  Name: Maureen Morales MRN: [id number] Date of Birth: 1922-07-24  Today's Date: 06/03/2017 OT Individual Time: 1100-1200 OT Individual Time Calculation (min): 60 min    Short Term Goals: Week 1:  OT Short Term Goal 1 (Week 1): Pt will perform toilet transfer with mod A. OT Short Term Goal 2 (Week 1): Pt will perform LB dressing with mod A. OT Short Term Goal 3 (Week 1): Pt will engage in 15 minutes of functional task with less than 2 rest breaks needed.   Skilled Therapeutic Interventions/Progress Updates:    1;1. Pt with no c/o pain throughout session. Pt stand pivot transfer with RW throughout session recliner>w/c with MOD A for lifting and balance during pivot and VC for hand placement. Pt bathes UB at sink with set up and A to wash back. Pt dons pull over shirt with supervision. While attempting to doff pants for LB bathing pt has incontinent BM in brief. Pt sit to stand with touching A and OT completes clothing management and cleanses peri area with touching A on pt for balance. Pt dons clean pants using reacher to thread BLE into pants and VC/touching A to thread RLE first and pull pant up d/t increased friction from grip sock. Pt sit to stand at sink as stated above with VC to not steady self on faucet as OT advances pants past hips. Pt dons L sock with sock aide and significantly increased time to thread sock onto sock aide d/t decreased hand strength. Exited session with pt seated in recliner, QRB donned and call light in reach.  Therapy Documentation Precautions:  Precautions Precautions: Fall, Posterior Hip Precaution Comments: pt recalls 2/3 posterior hip precautions; sign posted in room Restrictions Weight Bearing Restrictions: Yes RLE Weight Bearing: Non weight bearing  See Function Navigator for Current Functional Status.   Therapy/Group: Individual Therapy  Tonny Branch 06/03/2017, 12:08 PM

## 2017-06-04 ENCOUNTER — Inpatient Hospital Stay (HOSPITAL_COMMUNITY): Payer: Medicare Other | Admitting: Occupational Therapy

## 2017-06-04 ENCOUNTER — Inpatient Hospital Stay (HOSPITAL_COMMUNITY): Payer: Medicare Other | Admitting: Physical Therapy

## 2017-06-04 LAB — GLUCOSE, CAPILLARY
Glucose-Capillary: 125 mg/dL — ABNORMAL HIGH (ref 65–99)
Glucose-Capillary: 152 mg/dL — ABNORMAL HIGH (ref 65–99)
Glucose-Capillary: 128 mg/dL — ABNORMAL HIGH (ref 65–99)
Glucose-Capillary: 169 mg/dL — ABNORMAL HIGH (ref 65–99)

## 2017-06-04 LAB — CBC WITH DIFFERENTIAL/PLATELET
Basophils Absolute: 0 10*3/uL (ref 0.0–0.1)
Basophils Relative: 0 %
EOS ABS: 0.3 10*3/uL (ref 0.0–0.7)
Eosinophils Relative: 4 %
HEMATOCRIT: 29.6 % — AB (ref 36.0–46.0)
Hemoglobin: 9.8 g/dL — ABNORMAL LOW (ref 12.0–15.0)
Lymphocytes Relative: 17 %
Lymphs Abs: 1.5 10*3/uL (ref 0.7–4.0)
MCH: 28.7 pg (ref 26.0–34.0)
MCHC: 33.1 g/dL (ref 30.0–36.0)
MCV: 86.5 fL (ref 78.0–100.0)
MONO ABS: 0.7 10*3/uL (ref 0.1–1.0)
MONOS PCT: 8 %
Neutro Abs: 6.3 10*3/uL (ref 1.7–7.7)
Neutrophils Relative %: 71 %
Platelets: 387 10*3/uL (ref 150–400)
RBC: 3.42 MIL/uL — ABNORMAL LOW (ref 3.87–5.11)
RDW: 14.6 % (ref 11.5–15.5)
WBC: 8.8 10*3/uL (ref 4.0–10.5)

## 2017-06-04 LAB — BASIC METABOLIC PANEL
Anion gap: 11 (ref 5–15)
BUN: 33 mg/dL — AB (ref 6–20)
CALCIUM: 8.7 mg/dL — AB (ref 8.9–10.3)
CO2: 23 mmol/L (ref 22–32)
CREATININE: 0.97 mg/dL (ref 0.44–1.00)
Chloride: 103 mmol/L (ref 101–111)
GFR calc Af Amer: 57 mL/min — ABNORMAL LOW (ref >60)
GFR calc non Af Amer: 49 mL/min — ABNORMAL LOW (ref >60)
GLUCOSE: 143 mg/dL — AB (ref 65–99)
Potassium: 3.3 mmol/L — ABNORMAL LOW (ref 3.5–5.1)
Sodium: 137 mmol/L (ref 135–145)

## 2017-06-04 MED ORDER — CALCIUM POLYCARBOPHIL 625 MG PO TABS
625.0000 mg | ORAL_TABLET | Freq: Every day | ORAL | Status: DC
  Administered 2017-06-04 – 2017-06-07 (×4): 625 mg via ORAL
  Filled 2017-06-04 (×3): qty 1

## 2017-06-04 MED ORDER — GLIPIZIDE 2.5 MG HALF TABLET
2.5000 mg | ORAL_TABLET | Freq: Every day | ORAL | Status: DC
  Administered 2017-06-05 – 2017-06-13 (×9): 2.5 mg via ORAL
  Filled 2017-06-04 (×9): qty 1

## 2017-06-04 NOTE — Evaluation (Signed)
Recreational Therapy Assessment and Plan  Patient Details  Name: Maureen Morales MRN: [id number] Date of Birth: [birth year] Today's Date: 06/04/2017  Rehab Potential: Good ELOS: 10 days   Assessment Problem List:      Patient Active Problem List   Diagnosis Date Noted  . Intermittent asthma without complication   . Stage 3 chronic kidney disease ([place])   . Chronic diastolic congestive heart failure ([place])   . Periprosthetic fracture around internal prosthetic right hip joint ([place]) 05/30/2017  . Pressure injury of right heel, unstageable ([place])   . Anal fistula   . History of right hip hemiarthroplasty   . Benign essential HTN   . Acute blood loss anemia   . Diabetes mellitus type 2 in nonobese (H[facility])   . AKI (acute kidney injury) ([place])   . Post-operative pain   . Fall 05/26/2017  . Closed right hip fracture ([place]) 05/26/2017  . Normocytic anemia 05/26/2017  . Abdominal distention 05/26/2017  . CAD (coronary artery disease) 05/26/2017  . Depression   . HLD (hyperlipidemia)   . Essential hypertension   . 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 Closed displaced fracture of right femoral neck ([place]) 02/16/2017  . Diarrhea of infectious origin 11/28/2016  . Abnormal CT scan, colon   . Generalized anxiety disorder 09/07/2016  . Unsteady gait 09/07/2016  . NSTEMI (non-ST elevated myocardial infarction) ([place]) 08/30/2016  . Pain in joint, ankle and foot 06/24/2014  . S/P laparoscopic cholecystectomy 12/15/2013  . Preoperative general physical examination 12/15/2013  . Encounter for preventive health examination 11/13/2013  . Cervical spine disease 01/27/2013  . Anemia, iron deficiency 01/21/2013  . Right medial knee pain 01/09/2013  . Back pain, thoracic 08/27/2012  . Aortic stenosis, mild 05/06/2012  . Asthma 07/04/2011  . Hemorrhoids, internal 04/05/2011  . Nummular eczema 01/16/2011  . Essential hypertension   . Type  II diabetes mellitus with nephropathy ([place])   . Hyperlipidemia     Past Medical History:      Past Medical History:  Diagnosis Date  . Asthma without status asthmaticus    unspecified  . CAD (coronary artery disease)   . Colitis    unspecified  . Complication of anesthesia    sensitive to anesthesia  . Depression   . Diabetes mellitus   . Diabetes mellitus type 2, uncomplicated ([place])   . Essential hypertension   . GERD (gastroesophageal reflux disease)   . 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 HLD (hyperlipidemia)   . Hyperlipidemia    unspecified  . Hypertension   . Myocardial infarction ([place]) 2018  . Peripheral vascular disease ([place])   . Positive H. pylori test   . Viral gastroenteritis due to [place]-like agent Nov 2012   Past Surgical History:       Past Surgical History:  Procedure Laterality Date  . ABDOMINAL HYSTERECTOMY    . BLADDER SURGERY    . CARDIAC CATHETERIZATION    . CHOLECYSTECTOMY    . COLONOSCOPY     06/16/1987, 02/20/1995, 05/04/1999, 02/14/2005, 05/17/2007  . ESOPHAGOGASTRODUODENOSCOPY     05/11/1987, 06/13/1995, 10/09/1995, 05/04/1999, 01/16/2002  . FLEXIBLE SIGMOIDOSCOPY N/A 05/02/2017   Procedure: FLEXIBLE SIGMOIDOSCOPY;  Surgeon: Manya Silvas, MD;  Location: [facility] ENDOSCOPY;  Service: Endoscopy;  Laterality: N/A;  . HEMORRHOID SURGERY N/A 05/14/2017   Procedure: EXTERNAL THROMBOSED HEMORRHOIDECTOMY;  Surgeon: Robert Bellow, MD;  Location: [facility] ORS;  Service: General;  Laterality: N/A;  .  HIP ARTHROPLASTY Right 02/17/2017   Procedure: ARTHROPLASTY BIPOLAR HIP (HEMIARTHROPLASTY);  Surgeon: Claud Kelp, MD;  Location: [facility] ORS;  Service: Orthopedics;  Laterality: Right;  . LEFT HEART CATH AND CORONARY ANGIOGRAPHY N/A 08/31/2016   Procedure: Left Heart Cath and Coronary Angiography;  Surgeon: Wellington Hampshire, MD;  Location: [facility] CV LAB;  Service: Cardiovascular;  Laterality: N/A;   . ORIF PERIPROSTHETIC FRACTURE Right 05/27/2017  . ORIF PERIPROSTHETIC FRACTURE Right 05/27/2017   Procedure: OPEN REDUCTION INTERNAL FIXATION (ORIF) PERIPROSTHETIC FRACTURE;  Surgeon: Mcarthur Rossetti, MD;  Location: [facility];  Service: Orthopedics;  Laterality: Right;  . TOTAL HIP ARTHROPLASTY Right   . uterian prolapse      Assessment & Plan Clinical Impression: Patient is a 84 y.o. year old female with recent admission to the hospital with history of CAD, HTN, right hip hemiarthroplasty(still has hip precautions);who was admitted on 05/26/17 after fall with periprosthetic femur fracture and scalp hematoma.History taken from chart review, family, patient, and care giver.CT abdomen done in ED due to distension and questioned left rectal mass. She was taken to OR for ORIF right femoral shaft with plate and screw. ABLA treated with transfusion but continues to decline with Hgb 7.0, plan for 2 more units per primary team. She is to be NWB RLE for weeks to months per Dr. Ninfa Linden. She developed wheezing last night and CXR done today was negative for acute changes. Therapy ongoing and patient with functional decline. CIR recommended for follow up therapy. Discussed case with inpatient rehab director. Discussed with attending physician and plan for transfusion due to Hemoglobin trending down. Attempted to contact Ortho due to concern for hematoma, continue to await call back. Patient transferred to CIR on 05/30/2017.   Patient currently requires max with mobility secondary to muscle weakness and muscle joint tightness, decreased cardiorespiratoy endurance, decreased problem solving and decreased memory and decreased sitting balance, decreased standing balance, decreased postural control, decreased balance strategies and difficulty maintaining precautions.    Pt presents with decreased activity tolerance, decreased functional mobility, decreased balance Limiting pt's independence with  leisure/community pursuits.  Leisure History/Participation Premorbid leisure interest/current participation: Games - Museum/gallery curator (Comment) Other Leisure Interests: Reading;Cooking/Baking(writes a cookbook ) Leisure Participation Style: With Family/Friends Awareness of Community Resources: Good-identify 3 post discharge leisure resources Psychosocial / Spiritual Social interaction - Mood/Behavior: Cooperative Academic librarian Appropriate for Education?: Yes Recreational Therapy Orientation Orientation -Reviewed with patient: Available activity resources Strengths/Weaknesses Patient Strengths/Abilities: Willingness to participate;Active premorbidly Patient weaknesses: Physical limitations TR Patient demonstrates impairments in the following area(s): Edema;Endurance;Motor;Pain;Safety  Plan Rec Therapy Plan Is patient appropriate for Therapeutic Recreation?: Yes Rehab Potential: Good Treatment times per week: Min 1 TR session/group >25 minutes during LOS Estimated Length of Stay: 10 days TR Treatment/Interventions: Adaptive equipment instruction;Group participation (Comment);Provide activity resources in room;Therapeutic exercise;Wheelchair propulsion/positioning;1:1 session;Community reintegration;Recreation/leisure participation;UE/LE Chartered certified accountant training;Functional mobility training;Patient/family education;Therapeutic activities  Recommendations for other services: None   Discharge Criteria: Patient will be discharged from TR if patient refuses treatment 3 consecutive times without medical reason.  If treatment goals not met, if there is a change in medical status, if patient makes no progress towards goals or if patient is discharged from hospital.  The above assessment, treatment plan, treatment alternatives and goals were discussed and mutually agreed upon: by patient  [place] 06/04/2017, 3:35 PM

## 2017-06-04 NOTE — Progress Notes (Signed)
Physical Therapy Session Note  Patient Details  Name: Maureen Morales MRN: [id number] Date of Birth: 1922/11/12  Today's Date: 06/04/2017 PT Individual Time: 1000-1045 PT Individual Time Calculation (min): 45 min   Short Term Goals: Week 1:  PT Short Term Goal 1 (Week 1): Pt will be able to perform functional bed <> chair transfers with mod assist PT Short Term Goal 2 (Week 1): Pt will be able to perform sit <> stand with mod assist wile maintaining NWB status PT Short Term Goal 3 (Week 1): Pt will be able to propel w/c x 50' with min assist  Skilled Therapeutic Interventions/Progress Updates:    Pt in bed upon arrival, agreeable to PT session. TEx: AP, QS, HS, hip abd/add, SAQ - each 1X10. Bed Mob: supine>sitting min assist at trunk with handrail. Sit<>stand - min assist repeat X 4 including from bed and recliner. Pt able to ambulate 4 ft with rw and mod assist - using modified hop-to pattern. Modified rw to improved height for increased utilization of UEs. Following session, pt up in recliner with chair alarm on and all needs in reach.   Therapy Documentation Precautions:  Precautions Precautions: Fall, Posterior Hip Precaution Comments: pt recalls 2/3 posterior hip precautions; sign posted in room Restrictions Weight Bearing Restrictions: Yes RLE Weight Bearing: Non weight bearing  Pain: Pain Assessment Pain Assessment: No/denies pain   See Function Navigator for Current Functional Status.   Therapy/Group: Individual Therapy  Linard Millers, PT 06/04/2017, 1:57 PM

## 2017-06-04 NOTE — Progress Notes (Signed)
Occupational Therapy Session Note  Patient Details  Name: Maureen Morales MRN: [id number] Date of Birth: [birth year]  Today's Date: 06/04/2017 OT Individual Time: 7395-8441 OT Individual Time Calculation (min): 55 min    Short Term Goals: Week 1:  OT Short Term Goal 1 (Week 1): Pt will perform toilet transfer with mod A. OT Short Term Goal 2 (Week 1): Pt will perform LB dressing with mod A. OT Short Term Goal 3 (Week 1): Pt will engage in 15 minutes of functional task with less than 2 rest breaks needed.   Skilled Therapeutic Interventions/Progress Updates:    Pt received in bed receiving A from nurse tech with clean up after an incontinent bowel episode.  From supine with HOB elevated pt cleansed front perineal area then sat to EOB to wash upper body and upper legs.  She donned bra and shirt and then needed mod A with guiding A with use of reacher to don pants over R foot, pt was able to do L foot with mod cues.  Pt stood with min A maintaining WB prec.  She could pull pants over L side of hips with A to complete on R. Pt swiveled on L foot to recliner.  Set pt up with lunch and all needs met. Quick release belt and chair alarm on. Call light in reach.  Therapy Documentation Precautions:  Precautions Precautions: Fall, Posterior Hip Precaution Comments: pt recalls 2/3 posterior hip precautions; sign posted in room Restrictions Weight Bearing Restrictions: Yes RLE Weight Bearing: Non weight bearing  Pain: Pain Assessment Pain Assessment: No/denies pain ADL:    See Function Navigator for Current Functional Status.   Therapy/Group: Individual Therapy  [place] 06/04/2017, 12:18 PM

## 2017-06-04 NOTE — Progress Notes (Signed)
Occupational Therapy Session Note  Patient Details  Name: Maureen Morales MRN: [id number] Date of Birth: January 16, 1923  Today's Date: 06/04/2017 OT Individual Time: 8466-5993 OT Individual Time Calculation (min): 84 min   Short Term Goals: Week 1:  OT Short Term Goal 1 (Week 1): Pt will perform toilet transfer with mod A. OT Short Term Goal 2 (Week 1): Pt will perform LB dressing with mod A. OT Short Term Goal 3 (Week 1): Pt will engage in 15 minutes of functional task with less than 2 rest breaks needed.   Skilled Therapeutic Interventions/Progress Updates:    Pt greeted seated in recliner and agreeable to OT. Stand-pivot transfers throughout session with min/mod A overall. Pt brought to dayroom and worked on standing balance/endurance with standing card game. Pt tolerated standing and maintaining NWB R LE for 15 mins x 2 with 5 minute rest break in between. Pt needed min to mod A for dynamic balance when using B UE's to sort cards. Pt returned to room and reported need for bathroom. Stand-pivot to commode with min A, then assist for clothing management before and after. Pt voided bladder and had successful BM. Standing for peri-care with OT providing min A and set-up A for wash cloth while pt performed peri-care. Pt transferred back to recliner in similar fashion and left with LE's elevated, safety belt on, and call bell in reach.   Therapy Documentation Precautions:  Precautions Precautions: Fall, Posterior Hip Precaution Comments: pt recalls 2/3 posterior hip precautions; sign posted in room Restrictions Weight Bearing Restrictions: Yes RLE Weight Bearing: Non weight bearing Pain: Pain Assessment Pain Assessment: No/denies pain  See Function Navigator for Current Functional Status.   Therapy/Group: Individual Therapy  Valma Cava 06/04/2017, 3:47 PM

## 2017-06-04 NOTE — Progress Notes (Signed)
[place] PHYSICAL MEDICINE & REHABILITATION     PROGRESS NOTE  Subjective/Complaints:  Pt seen lying in bed this AM.  She slept well overnight.  She has questions why she receives so much therapy in CIR.   ROS: Denies CP, SOB, nausea, vomiting, diarrhea.  Objective: Vital Signs: Blood pressure (!) 155/66, pulse 77, temperature 98 F (36.7 C), temperature source Oral, resp. rate 18, height 5\' 1"  (1.549 m), weight 62.2 kg (137 lb 2 oz), SpO2 97 %. No results found. No results for input(s): WBC, HGB, HCT, PLT in the last 72 hours. No results for input(s): NA, K, CL, GLUCOSE, BUN, CREATININE, CALCIUM in the last 72 hours.  Invalid input(s): CO CBG (last 3)  Recent Labs    06/03/17 1653 06/03/17 2118 06/04/17 0632  GLUCAP 118* 244* 125*    Wt Readings from Last 3 Encounters:  06/03/17 62.2 kg (137 lb 2 oz)  05/22/17 57.2 kg (126 lb)  05/14/17 58.1 kg (128 lb)    Physical Exam:  BP (!) 155/66 (BP Location: Left Arm)   Pulse 77   Temp 98 F (36.7 C) (Oral)   Resp 18   Ht 5\' 1"  (1.549 m)   Wt 62.2 kg (137 lb 2 oz)   SpO2 97%   BMI 25.91 kg/m  Constitutional: She appears well-developed and well-nourished. No distress.  HENT: Normocephalic and atraumatic.  Eyes: EOM are normal. No discharge.  Cardiovascular: RRR. Murmur heard. No JVD. Respiratory: Effort normal. Clear.  GI: Bowel sounds are normal. She exhibits no distension.  Musculoskeletal: Edema right hip and thigh  Neurological: She is alert and oriented.  Motor: B/l UE: 4/5 proximal to distal RLE: HF, 3/5, ADF/PF 4/5  LLE: HF, KE 4/5, ADF/PF 4+/5  Skin: Dry surgical dressing in place.  Right heel blister.   Psychiatric: She has a normal mood and affect. Her behavior is normal.    Assessment/Plan: 1. Functional deficits secondary to right peri-prosthetic hip fracture which require 3+ hours per day of interdisciplinary therapy in a comprehensive inpatient rehab setting. Physiatrist is providing close team  supervision and 24 hour management of active medical problems listed below. Physiatrist and rehab team continue to assess barriers to discharge/monitor patient progress toward functional and medical goals.  Function:  Bathing Bathing position Bathing activity did not occur: Safety/medical concerns Position: Other (comment)(sitting on BSC)  Bathing parts Body parts bathed by patient: Right arm, Left arm, Chest, Abdomen, Front perineal area, Right upper leg, Left upper leg Body parts bathed by helper: Right lower leg, Left lower leg, Back, Buttocks  Bathing assist        Upper Body Dressing/Undressing Upper body dressing   What is the patient wearing?: Button up shirt         Button up shirt - Perfomed by patient: Thread/unthread right sleeve, Thread/unthread left sleeve, Pull shirt around back, Button/unbutton shirt      Upper body assist Assist Level: Set up      Lower Body Dressing/Undressing Lower body dressing Lower body dressing/undressing activity did not occur: Safety/medical concerns What is the patient wearing?: Underwear, Pants, Non-skid slipper socks, Shoes   Underwear - Performed by helper: Thread/unthread right underwear leg, Thread/unthread left underwear leg, Pull underwear up/down(pt used reacher with 50% help)   Pants- Performed by helper: Thread/unthread right pants leg, Thread/unthread left pants leg, Pull pants up/down(Pt used reacher with 50% help)   Non-skid slipper socks- Performed by helper: Don/doff right sock(R foot only)  Shoes - Performed by helper: Don/doff left shoe(L foot only)          Lower body assist        Toileting Toileting Toileting activity did not occur: Safety/medical concerns Toileting steps completed by patient: Adjust clothing prior to toileting Toileting steps completed by helper: Adjust clothing prior to toileting, Performs perineal hygiene, Adjust clothing after toileting Toileting Assistive Devices: Grab bar or  rail  Toileting assist Assist level: Touching or steadying assistance (Pt.75%)   Transfers Chair/bed transfer Chair/bed transfer activity did not occur: Safety/medical concerns(Hg 7.0 - medically unstable) Chair/bed transfer method: Squat pivot Chair/bed transfer assist level: Touching or steadying assistance (Pt > 75%) Chair/bed transfer assistive device: Armrests     Locomotion Ambulation Ambulation activity did not occur: Safety/medical concerns(Hg 7.0 - medically unstable)         Wheelchair Wheelchair activity did not occur: Safety/medical concerns(Hg 7.0 - medically unstable) Type: Manual Max wheelchair distance: 60 ft Assist Level: Supervision or verbal cues  Cognition Comprehension Comprehension assist level: Follows complex conversation/direction with no assist  Expression Expression assist level: Expresses complex ideas: With no assist  Social Interaction Social Interaction assist level: Interacts appropriately with others - No medications needed.  Problem Solving Problem solving assist level: Solves complex problems: Recognizes & self-corrects  Memory Memory assist level: Complete Independence: No helper    Medical Problem List and Plan: 1.  Deficits with mobility, transfers, and self-care secondary to right peri-prosthetic hip fracture.    Continue CIR   Notes reviewed  2.  DVT Prophylaxis/Anticoagulation: Mechanical: Sequential compression devices, below knee Bilateral lower extremities--no blood thinners due to history of hematochezia.    Vascular study negative for DVT on 1/31 3. Pain Management: oxycodone prn 4. Mood: team to provide ego support to help manage pain and anxiety 5. Neuropsych: This patient is capable of making decisions on her own behalf. 6. Skin/Wound Care: Monitor scalp wound, anal wound and right hip incision for healing.  7. Fluids/Electrolytes/Nutrition: Monitor I/Os.  8. ABLA:    Post transfusion hemoglobin 10.5 on 1/31   Labs ordered for  Monday 06/04/2017 9. HTN: Monitor BP bid--continue Cozaar.   Toprolol increased on 2/3, will consider further increase tomorrow    Vitals:   06/03/17 1344 06/04/17 0224  BP: (!) 153/41 (!) 155/66  Pulse: 77 77  Resp: 18 18  Temp: 98.3 F (36.8 C) 98 F (36.7 C)  SpO2: 99% 97%   10. T2DM: Hgb A1C-7.0. BS    Will d/c Lantus 2/5    Discussed with pharmacy, will change to Glucatrol 2.5 daily (PTA med)    CBG (last 3)  Recent Labs    06/03/17 1653 06/03/17 2118 06/04/17 0632  GLUCAP 118* 244* 125*  11. H/o Asthma: Scheduled nebs qid, changed to PRN. 12. Right heel ulcer: Ordered prevalon boot. Foam dressing. Added protein supplement to help with wound healing.  13. Recent Anal fistulotomy: Keep area clean and dry.  14. Chronic renal failure?: Baseline SCr around 1.2?    Creatinine 1.18 on 1/31   Labs pending 15. Right hemiarthroplasty: She is to continue right total hip precautions as well as  NWB due to recent surgery.  16. CAD with moderate aortic stenosis/Grade 2 diastolic dysfunction: Monitor daily weights. On lipitor, Cozaar and Toprol. Avoid overload.  Filed Weights   05/31/17 0500 06/02/17 0116 06/03/17 0045  Weight: 63.7 kg (140 lb 6.9 oz) 62.2 kg (137 lb 3.2 oz) 62.2 kg (137 lb 2 oz)  17. Hypoalbuminemia  Supplement initiated  LOS (Days) 5 A FACE TO FACE EVALUATION WAS PERFORMED  Ankit Lorie [place]ix 06/04/2017 8:30 AM

## 2017-06-05 ENCOUNTER — Inpatient Hospital Stay (HOSPITAL_COMMUNITY): Payer: Medicare Other | Admitting: Occupational Therapy

## 2017-06-05 ENCOUNTER — Inpatient Hospital Stay (HOSPITAL_COMMUNITY): Payer: Medicare Other

## 2017-06-05 DIAGNOSIS — E876 Hypokalemia: Secondary | ICD-10-CM

## 2017-06-05 LAB — GLUCOSE, CAPILLARY
Glucose-Capillary: 107 mg/dL — ABNORMAL HIGH (ref 65–99)
Glucose-Capillary: 143 mg/dL — ABNORMAL HIGH (ref 65–99)
Glucose-Capillary: 123 mg/dL — ABNORMAL HIGH (ref 65–99)
Glucose-Capillary: 152 mg/dL — ABNORMAL HIGH (ref 65–99)

## 2017-06-05 MED ORDER — HYDRALAZINE HCL 10 MG PO TABS
10.0000 mg | ORAL_TABLET | Freq: Three times a day (TID) | ORAL | Status: DC
  Administered 2017-06-05 – 2017-06-09 (×13): 10 mg via ORAL
  Filled 2017-06-05 (×12): qty 1

## 2017-06-05 MED ORDER — POTASSIUM CHLORIDE CRYS ER 20 MEQ PO TBCR
30.0000 meq | EXTENDED_RELEASE_TABLET | Freq: Two times a day (BID) | ORAL | Status: AC
  Administered 2017-06-05 (×2): 30 meq via ORAL
  Filled 2017-06-05 (×2): qty 1

## 2017-06-05 NOTE — Progress Notes (Signed)
Physical Therapy Session Note  Patient Details  Name: Maureen Morales MRN: [id number] Date of Birth: 12-Jul-1922  Today's Date: 06/05/2017 PT Individual Time: 0902-0930 PT Individual Time Calculation (min): 28 min   Short Term Goals: Week 1:  PT Short Term Goal 1 (Week 1): Pt will be able to perform functional bed <> chair transfers with mod assist PT Short Term Goal 2 (Week 1): Pt will be able to perform sit <> stand with mod assist wile maintaining NWB status PT Short Term Goal 3 (Week 1): Pt will be able to propel w/c x 50' with min assist  Skilled Therapeutic Interventions/Progress Updates:    Pt presents up in w/c requesting to change from hospital gown to robe. Doffed and donned with set up assist and adjusted in standing with min assist for balance but mod assist for sit <> stand. Cues for hand placement and technique. Focused on functional sit <> stands, standing tolerance, and LE therex for functional strengthening. Pt requires mod assist for all sit <>stands during session with facilitation for anterior weightshift and cues for hand placement (does best pushing up with BUE from armrests). Standing RLE therex including marches, hip abduction and hamstring curls x 10 reps each (seated rest break between sets). Seated position engaged in LAQ in BLE x 10 reps each during rest break from standing. Mod assist stand pivot to recliner and set up with RLE elevated. All needs in reach.   Therapy Documentation Precautions:  Precautions Precautions: Fall, Posterior Hip Precaution Comments: pt recalls 2/3 posterior hip precautions; sign posted in room Restrictions Weight Bearing Restrictions: Yes RLE Weight Bearing: Non weight bearing   Pain:  Denies pain. Some discomfort with movement but able to tolerate   See Function Navigator for Current Functional Status.   Therapy/Group: Individual Therapy  Canary Brim Ivory Broad, PT, DPT  06/05/2017, 10:15 AM

## 2017-06-05 NOTE — Progress Notes (Signed)
Occupational Therapy Session Note  Patient Details  Name: Maureen Morales MRN: [id number] Date of Birth: 01/29/1923  Today's Date: 06/05/2017 OT Individual Time: 1105-1205 OT Individual Time Calculation (min): 60 min    Short Term Goals: Week 1:  OT Short Term Goal 1 (Week 1): Pt will perform toilet transfer with mod A. OT Short Term Goal 2 (Week 1): Pt will perform LB dressing with mod A. OT Short Term Goal 3 (Week 1): Pt will engage in 15 minutes of functional task with less than 2 rest breaks needed.   Skilled Therapeutic Interventions/Progress Updates:    Pt presented sitting up in recliner, agreeable to OT tx session, no c/o pain. Pt completes stand pivot transfers throughout session to L and R using RW with overall ModA, pivoting on L foot during transfer completion, noted increased difficulty advancing foot backwards when needing to back up to seated surface. Pt transfers to recliner>w/c, propels w/c with intermittent MinA to therapy gym and increased time for bil UE strengthening. Pt participates in standing game of checkers, standing approx 5 min and >75min with seated rest break between, ModA for sit<>stand and requiring min-modA to maintain standing balance with single UE support during activity completion. Pt transfers w/c>mat table, seated on mat table participates in bil UE exercise using 1lb dowel rod across multiple planes for 10x each. Pt returned to w/c in manner described above, total assist to return to room due to time, transfer w/c>recliner where Pt was left with QRB donned and chair alarm set, lunch tray set up, call bell and needs within reach.   Therapy Documentation Precautions:  Precautions Precautions: Fall, Posterior Hip Precaution Comments: pt recalls 2/3 posterior hip precautions; sign posted in room Restrictions Weight Bearing Restrictions: Yes RLE Weight Bearing: Non weight bearing    See Function Navigator for Current Functional Status.   Therapy/Group:  Individual Therapy  Raymondo Band 06/05/2017, 4:03 PM

## 2017-06-05 NOTE — Progress Notes (Signed)
[place] PHYSICAL MEDICINE & REHABILITATION     PROGRESS NOTE  Subjective/Complaints:  Pt seen lying in bed this AM.  She states she slept well overnight.  She notes some drainage from her incision site.   ROS: Denies CP, SOB, nausea, vomiting, diarrhea.  Objective: Vital Signs: Blood pressure (!) 150/55, pulse 72, temperature 98.4 F (36.9 C), temperature source Oral, resp. rate 20, height 5\' 1"  (1.549 m), weight 60.9 kg (134 lb 4.2 oz), SpO2 100 %. No results found. Recent Labs    06/04/17 0751  WBC 8.8  HGB 9.8*  HCT 29.6*  PLT 387   Recent Labs    06/04/17 0751  NA 137  K 3.3*  CL 103  GLUCOSE 143*  BUN 33*  CREATININE 0.97  CALCIUM 8.7*   CBG (last 3)  Recent Labs    06/04/17 1701 06/04/17 2201 06/05/17 0709  GLUCAP 128* 169* 107*    Wt Readings from Last 3 Encounters:  06/05/17 60.9 kg (134 lb 4.2 oz)  05/22/17 57.2 kg (126 lb)  05/14/17 58.1 kg (128 lb)    Physical Exam:  BP (!) 150/55 (BP Location: Left Arm)   Pulse 72   Temp 98.4 F (36.9 C) (Oral)   Resp 20   Ht 5\' 1"  (1.549 m)   Wt 60.9 kg (134 lb 4.2 oz)   SpO2 100%   BMI 25.37 kg/m  Constitutional: She appears well-developed and well-nourished. No distress.  HENT: Normocephalic and atraumatic.  Eyes: EOM are normal. No discharge.  Cardiovascular: RRR. Murmur heard. No JVD. Respiratory: Effort normal. Clear.  GI: Bowel sounds are normal. She exhibits no distension.  Musculoskeletal: Edema right hip and thigh, slowly improving Neurological: She is alert and oriented.  Motor: B/l UE: 4/5 proximal to distal RLE: HF, 3/5, ADF/PF 4/5 (stable) LLE: HF, KE 4/5, ADF/PF 4+/5  Skin: Right thigh incision with staples c/d/i Right heel blister.   Psychiatric: She has a normal mood and affect. Her behavior is normal.    Assessment/Plan: 1. Functional deficits secondary to right peri-prosthetic hip fracture which require 3+ hours per day of interdisciplinary therapy in a comprehensive  inpatient rehab setting. Physiatrist is providing close team supervision and 24 hour management of active medical problems listed below. Physiatrist and rehab team continue to assess barriers to discharge/monitor patient progress toward functional and medical goals.  Function:  Bathing Bathing position Bathing activity did not occur: Safety/medical concerns Position: Sitting EOB  Bathing parts Body parts bathed by patient: Right arm, Left arm, Chest, Abdomen, Front perineal area, Right upper leg, Left upper leg Body parts bathed by helper: Right lower leg, Left lower leg, Back, Buttocks  Bathing assist        Upper Body Dressing/Undressing Upper body dressing   What is the patient wearing?: Button up shirt, Bra Bra - Perfomed by patient: Thread/unthread right bra strap, Thread/unthread left bra strap, Hook/unhook bra (pull down sports bra)       Button up shirt - Perfomed by patient: Thread/unthread right sleeve, Thread/unthread left sleeve, Pull shirt around back, Button/unbutton shirt      Upper body assist Assist Level: Set up      Lower Body Dressing/Undressing Lower body dressing Lower body dressing/undressing activity did not occur: Safety/medical concerns What is the patient wearing?: Pants, Non-skid slipper socks   Underwear - Performed by helper: Thread/unthread right underwear leg, Thread/unthread left underwear leg, Pull underwear up/down(pt used reacher with 50% help) Pants- Performed by patient: Thread/unthread left pants leg Pants-  Performed by helper: Thread/unthread right pants leg, Pull pants up/down   Non-skid slipper socks- Performed by helper: Don/doff right sock, Don/doff left sock       Shoes - Performed by helper: Don/doff left shoe(L foot only)          Lower body assist Assist for lower body dressing: (total assist)      Toileting Toileting   Toileting steps completed by patient: Adjust clothing prior to toileting Toileting steps completed by  helper: Adjust clothing prior to toileting, Performs perineal hygiene, Adjust clothing after toileting Toileting Assistive Devices: Grab bar or rail  Toileting assist Assist level: Two helpers   Transfers Chair/bed transfer Chair/bed transfer activity did not occur: Safety/medical concerns(Hg 7.0 - medically unstable) Chair/bed transfer method: Stand pivot Chair/bed transfer assist level: Touching or steadying assistance (Pt > 75%) Chair/bed transfer assistive device: Medical sales representative Ambulation activity did not occur: Safety/medical concerns(Hg 7.0 - medically unstable)   Max distance: 4 ft Assist level: Moderate assist (Pt 50 - 74%)   Wheelchair Wheelchair activity did not occur: Safety/medical concerns(Hg 7.0 - medically unstable) Type: Manual Max wheelchair distance: 60 ft Assist Level: Supervision or verbal cues  Cognition Comprehension Comprehension assist level: Follows complex conversation/direction with no assist  Expression Expression assist level: Expresses complex ideas: With no assist  Social Interaction Social Interaction assist level: Interacts appropriately with others with medication or extra time (anti-anxiety, antidepressant).  Problem Solving Problem solving assist level: Solves complex problems: Recognizes & self-corrects  Memory Memory assist level: Complete Independence: No helper    Medical Problem List and Plan: 1.  Deficits with mobility, transfers, and self-care secondary to right peri-prosthetic hip fracture.    Continue CIR 2.  DVT Prophylaxis/Anticoagulation: Mechanical: Sequential compression devices, below knee Bilateral lower extremities--no blood thinners due to history of hematochezia.    Vascular study negative for DVT on 1/31 3. Pain Management: oxycodone prn 4. Mood: team to provide ego support to help manage pain and anxiety 5. Neuropsych: This patient is capable of making decisions on her own behalf. 6. Skin/Wound Care:  Monitor scalp wound, anal wound and right hip incision for healing.  7. Fluids/Electrolytes/Nutrition: Monitor I/Os.  8. ABLA:    Hemoglobin 9.8 on 2/4   Cont to monitor 9. HTN: Monitor BP bid--continue Cozaar.   Toprolol increased on 2/3   Hydralazine 10 TID started on 2/5    Vitals:   06/04/17 1404 06/05/17 0505  BP: (!) 127/43 (!) 150/55  Pulse: 72 72  Resp: 18 20  Temp: 98.9 F (37.2 C) 98.4 F (36.9 C)  SpO2: 96% 100%   10. T2DM: Hgb A1C-7.0. BS    Will d/c Lantus 2/5    Discussed with pharmacy, will change to Glucatrol 2.5 daily (PTA med) on 2/5    CBG (last 3)  Recent Labs    06/04/17 1701 06/04/17 2201 06/05/17 0709  GLUCAP 128* 169* 107*    Relatively controlled on 2/4 11. H/o Asthma: Scheduled nebs qid, changed to PRN. 12. Right heel ulcer: Ordered prevalon boot. Foam dressing. Added protein supplement to help with wound healing.  13. Recent Anal fistulotomy: Keep area clean and dry.  14. Chronic renal failure?: Baseline SCr around 1.2?    Creatinine 0.97 on 2/4 15. Right hemiarthroplasty: She is to continue right total hip precautions as well as  NWB due to recent surgery.  16. CAD with moderate aortic stenosis/Grade 2 diastolic dysfunction: Monitor daily weights. On lipitor, Cozaar and Toprol.  Avoid overload.  Filed Weights   06/02/17 0116 06/03/17 0045 06/05/17 0505  Weight: 62.2 kg (137 lb 3.2 oz) 62.2 kg (137 lb 2 oz) 60.9 kg (134 lb 4.2 oz)  17. Hypoalbuminemia  Supplement initiated 18. Hypokalemia  K+ 3.3 on 2/4, supplemented x1 day   Cont to montor 19. Relatively freq stools per pt   No diarhhea per report and pt  Fiber added 2/4  LOS (Days) 6 A FACE TO FACE EVALUATION WAS PERFORMED  Maureen Morales Maureen Morales 06/05/2017 8:27 AM

## 2017-06-05 NOTE — Progress Notes (Signed)
Occupational Therapy Session Note  Patient Details  Name: Maureen Morales MRN: [id number] Date of Birth: March 16, 1923  Today's Date: 06/05/2017 OT Individual Time: 1000-1045 OT Individual Time Calculation (min): 45 min    Short Term Goals: Week 1:  OT Short Term Goal 1 (Week 1): Pt will perform toilet transfer with mod A. OT Short Term Goal 2 (Week 1): Pt will perform LB dressing with mod A. OT Short Term Goal 3 (Week 1): Pt will engage in 15 minutes of functional task with less than 2 rest breaks needed.   Skilled Therapeutic Interventions/Progress Updates:    Pt received in bathroom with NT assisting pt.  Assisted pt back to w/c with stand pivot.  Pt worked on B/D at sink with AE.  She continues to have difficulty with the reacher and often says " I have so many people at home that will help me with this".  Encouraged pt to keep trying until she goes home.  Pt agreeable.  When self care completed pt transferred back to recliner and worked on LE AROM exercises maintaining her hip precautions. Pt stated she feels ready to go home as she anticipates she will still need this level of min-mod A with self care at home until she no longer has to be NWB on R leg.  Pt continues to have staples in R leg and can not safely transfer into a walk in shower with her only using her L leg.  Pt resting in recliner with quick release belt on, call light in reach.  Therapy Documentation Precautions:  Precautions Precautions: Fall, Posterior Hip Precaution Comments: pt recalls 2/3 posterior hip precautions; sign posted in room Restrictions Weight Bearing Restrictions: Yes RLE Weight Bearing: Non weight bearing   Pain: no c/o pain   ADL:     See Function Navigator for Current Functional Status.   Therapy/Group: Individual Therapy  [place] 06/05/2017, 1:59 PM

## 2017-06-05 NOTE — Progress Notes (Signed)
Physical Therapy Session Note  Patient Details  Name: Maureen Morales MRN: [id number] Date of Birth: [birth year]  Today's Date: 06/05/2017 PT Individual Time: 8099-8338 PT Individual Time Calculation (min): 54 min   Short Term Goals: Week 1:  PT Short Term Goal 1 (Week 1): Pt will be able to perform functional bed <> chair transfers with mod assist PT Short Term Goal 2 (Week 1): Pt will be able to perform sit <> stand with mod assist wile maintaining NWB status PT Short Term Goal 3 (Week 1): Pt will be able to propel w/c x 50' with min assist  Skilled Therapeutic Interventions/Progress Updates:    Patient in recliner in room, agreeable to PT.  Stand pivot to w/c with RW mod A for lifting from recliner with cues for hand placement.  Patient in w/c propelled to gym with min A pushing mainly with L UE (wheelchair goes to L).  Patient sit to stand mod A and hopped x 4' to mat mod A with RW mod cues for backing safely to seat, but sitting prior to backing up with lowering help.  Patient on mat for LAQ, hip flexion, sit to supine min A for R LE assist.  Supine for heel slides, quad sets, hip abduction R LE.  Placed wedge under pt due to c/o back pain.  Patient supine to sit min A increased time.  Stand pivot to w/c mod A with RW.  Standing balance while reaching for and tossing horse shoes min to mod A for balance with NWB R LE.  Patient in w/c propelled to room and sit to stand to wash hands in sink min a for balance as pt leaning on sink.  Stand pivot w/c to recliner mod A with RW.  Patient in recliner with call bell in reach and assisted to heat sandwich and other needs in reach.   Therapy Documentation Precautions:  Precautions Precautions: Fall, Posterior Hip Precaution Comments: pt recalls 2/3 posterior hip precautions; sign posted in room Restrictions Weight Bearing Restrictions: Yes RLE Weight Bearing: Non weight bearing Pain: Pain Assessment Pain Score: 0-No pain Faces Pain Scale: Hurts  little more Pain Location: Rectum Pain Descriptors / Indicators: Sore Pain Onset: On-going Pain Intervention(s): Repositioned   See Function Navigator for Current Functional Status.   Therapy/Group: Individual Therapy  Reginia Naas 06/05/2017, 5:03 PM

## 2017-06-06 ENCOUNTER — Inpatient Hospital Stay (HOSPITAL_COMMUNITY): Payer: Medicare Other | Admitting: Occupational Therapy

## 2017-06-06 ENCOUNTER — Inpatient Hospital Stay (HOSPITAL_COMMUNITY): Payer: Medicare Other | Admitting: Physical Therapy

## 2017-06-06 LAB — GLUCOSE, CAPILLARY
GLUCOSE-CAPILLARY: 173 mg/dL — AB (ref 65–99)
Glucose-Capillary: 120 mg/dL — ABNORMAL HIGH (ref 65–99)
Glucose-Capillary: 215 mg/dL — ABNORMAL HIGH (ref 65–99)
Glucose-Capillary: 121 mg/dL — ABNORMAL HIGH (ref 65–99)

## 2017-06-06 NOTE — Progress Notes (Signed)
[place] PHYSICAL MEDICINE & REHABILITATION     PROGRESS NOTE  Subjective/Complaints:  Patient seen sitting up in bed this morning. She states she slept well overnight. She notes improvement in strength. She has questions if she can go home soon.  ROS: Denies CP, SOB, nausea, vomiting, diarrhea.  Objective: Vital Signs: Blood pressure (!) 132/54, pulse 72, temperature 97.6 F (36.4 C), temperature source Oral, resp. rate 17, height 5\' 1"  (1.549 m), weight 61 kg (134 lb 7.7 oz), SpO2 98 %. No results found. Recent Labs    06/04/17 0751  WBC 8.8  HGB 9.8*  HCT 29.6*  PLT 387   Recent Labs    06/04/17 0751  NA 137  K 3.3*  CL 103  GLUCOSE 143*  BUN 33*  CREATININE 0.97  CALCIUM 8.7*   CBG (last 3)  Recent Labs    06/05/17 1659 06/05/17 2047 06/06/17 0632  GLUCAP 123* 152* 120*    Wt Readings from Last 3 Encounters:  06/06/17 61 kg (134 lb 7.7 oz)  05/22/17 57.2 kg (126 lb)  05/14/17 58.1 kg (128 lb)    Physical Exam:  BP (!) 132/54 (BP Location: Left Arm)   Pulse 72   Temp 97.6 F (36.4 C) (Oral)   Resp 17   Ht 5\' 1"  (1.549 m)   Wt 61 kg (134 lb 7.7 oz)   SpO2 98%   BMI 25.41 kg/m  Constitutional: She appears well-developed and well-nourished. No distress.  HENT: Normocephalic and atraumatic.  Eyes: EOM are normal. No discharge.  Cardiovascular: RRR. Murmur heard. No JVD. Respiratory: Effort normal. Clear.  GI: Bowel sounds are normal. She exhibits no distension.  Musculoskeletal: Edema right hip and thigh, improving  Neurological: She is alert and oriented.  Motor: B/l UE: 4/5 proximal to distal RLE: HF, 3/5, ADF/PF 4/5 (unchanged) LLE: HF, KE 4/5, ADF/PF 4+/5  Skin: Right thigh incision with staples c/d/i Right heel blister.   Psychiatric: She has a normal mood and affect. Her behavior is normal.    Assessment/Plan: 1. Functional deficits secondary to right peri-prosthetic hip fracture which require 3+ hours per day of interdisciplinary  therapy in a comprehensive inpatient rehab setting. Physiatrist is providing close team supervision and 24 hour management of active medical problems listed below. Physiatrist and rehab team continue to assess barriers to discharge/monitor patient progress toward functional and medical goals.  Function:  Bathing Bathing position Bathing activity did not occur: Safety/medical concerns Position: Wheelchair/chair at sink  Bathing parts Body parts bathed by patient: Right arm, Left arm, Chest, Abdomen, Front perineal area, Right upper leg, Left upper leg Body parts bathed by helper: Right lower leg, Left lower leg, Back, Buttocks  Bathing assist        Upper Body Dressing/Undressing Upper body dressing   What is the patient wearing?: Button up shirt, Bra Bra - Perfomed by patient: Thread/unthread right bra strap, Thread/unthread left bra strap, Hook/unhook bra (pull down sports bra)       Button up shirt - Perfomed by patient: Thread/unthread right sleeve, Thread/unthread left sleeve, Pull shirt around back, Button/unbutton shirt      Upper body assist Assist Level: Set up      Lower Body Dressing/Undressing Lower body dressing Lower body dressing/undressing activity did not occur: Safety/medical concerns What is the patient wearing?: Pants, Non-skid slipper socks   Underwear - Performed by helper: Thread/unthread right underwear leg, Thread/unthread left underwear leg, Pull underwear up/down Pants- Performed by patient: Thread/unthread left pants leg Pants-  Performed by helper: Thread/unthread right pants leg, Pull pants up/down   Non-skid slipper socks- Performed by helper: Don/doff right sock, Don/doff left sock       Shoes - Performed by helper: Don/doff left shoe(L foot only)          Lower body assist Assist for lower body dressing: (total assist)      Toileting Toileting   Toileting steps completed by patient: Adjust clothing prior to toileting Toileting steps  completed by helper: Adjust clothing prior to toileting, Performs perineal hygiene, Adjust clothing after toileting Toileting Assistive Devices: Grab bar or rail  Toileting assist Assist level: Two helpers   Transfers Chair/bed transfer Chair/bed transfer activity did not occur: Safety/medical concerns(Hg 7.0 - medically unstable) Chair/bed transfer method: Stand pivot Chair/bed transfer assist level: Moderate assist (Pt 50 - 74%/lift or lower) Chair/bed transfer assistive device: Armrests, Walker     Locomotion Ambulation Ambulation activity did not occur: Safety/medical concerns(Hg 7.0 - medically unstable)   Max distance: 4 ft Assist level: Moderate assist (Pt 50 - 74%)   Wheelchair Wheelchair activity did not occur: Safety/medical concerns(Hg 7.0 - medically unstable) Type: Manual Max wheelchair distance: 120 Assist Level: Touching or steadying assistance (Pt > 75%)  Cognition Comprehension Comprehension assist level: Follows complex conversation/direction with no assist  Expression Expression assist level: Expresses complex ideas: With no assist  Social Interaction Social Interaction assist level: Interacts appropriately with others with medication or extra time (anti-anxiety, antidepressant).(daily antidepressant)  Problem Solving Problem solving assist level: Solves complex problems: Recognizes & self-corrects  Memory Memory assist level: Complete Independence: No helper    Medical Problem List and Plan: 1.  Deficits with mobility, transfers, and self-care secondary to right peri-prosthetic hip fracture.    Continue CIR 2.  DVT Prophylaxis/Anticoagulation: Mechanical: Sequential compression devices, below knee Bilateral lower extremities--no blood thinners due to history of hematochezia.    Vascular study negative for DVT on 1/31 3. Pain Management: oxycodone prn 4. Mood: team to provide ego support to help manage pain and anxiety 5. Neuropsych: This patient is capable of  making decisions on her own behalf. 6. Skin/Wound Care: Monitor scalp wound, anal wound and right hip incision for healing.  7. Fluids/Electrolytes/Nutrition: Monitor I/Os.  8. ABLA:    Hemoglobin 9.8 on 2/4   Cont to monitor 9. HTN: Monitor BP bid--continue Cozaar.   Toprolol increased on 2/3   Hydralazine 10 TID started on 2/5    Vitals:   06/05/17 1408 06/06/17 0319  BP: (!) 148/59 (!) 132/54  Pulse: 74 72  Resp: 16 17  Temp: 98.4 F (36.9 C) 97.6 F (36.4 C)  SpO2: 100% 98%    Improving on 2/6 10. T2DM: Hgb A1C-7.0. BS    Will d/c Lantus 2/5    Discussed with pharmacy, will change to Glucatrol 2.5 daily (PTA med) on 2/5    CBG (last 3)  Recent Labs    06/05/17 1659 06/05/17 2047 06/06/17 0632  GLUCAP 123* 152* 120*    Relatively controlled on 2/6 11. H/o Asthma: Scheduled nebs qid, changed to PRN. 12. Right heel ulcer: Ordered prevalon boot. Foam dressing. Added protein supplement to help with wound healing.  13. Recent Anal fistulotomy: Keep area clean and dry.  14. Chronic renal failure?: Baseline SCr around 1.2?    Creatinine 0.97 on 2/4 15. Right hemiarthroplasty: She is to continue right total hip precautions as well as  NWB due to recent surgery.  16. CAD with moderate aortic stenosis/Grade 2 diastolic  dysfunction: Monitor daily weights. On lipitor, Cozaar and Toprol. Avoid overload.  Filed Weights   06/03/17 0045 06/05/17 0505 06/06/17 0319  Weight: 62.2 kg (137 lb 2 oz) 60.9 kg (134 lb 4.2 oz) 61 kg (134 lb 7.7 oz)  17. Hypoalbuminemia  Supplement initiated 18. Hypokalemia  K+ 3.3 on 2/4, supplemented x1 day   Cont to montor 19. Relatively freq stools per pt   No diarhhea per report and pt  Fiber added 2/4  LOS (Days) 7 A FACE TO FACE EVALUATION WAS PERFORMED  Vibha Ferdig Lorie [place]ix 06/06/2017 8:21 AM

## 2017-06-06 NOTE — Progress Notes (Signed)
Occupational Therapy Session Note  Patient Details  Name: Maureen Morales MRN: [id number] Date of Birth: 09-Apr-1923  Today's Date: 06/06/2017 OT Individual Time: 1000-1115 OT Individual Time Calculation (min): 75 min    Short Term Goals: Week 1:  OT Short Term Goal 1 (Week 1): Pt will perform toilet transfer with mod A. OT Short Term Goal 2 (Week 1): Pt will perform LB dressing with mod A. OT Short Term Goal 3 (Week 1): Pt will engage in 15 minutes of functional task with less than 2 rest breaks needed.   Skilled Therapeutic Interventions/Progress Updates:    Pt presents sitting up in recliner, agreeable to OT tx session with no c/o pain. Pt completes stand pivot transfer recliner>w/c using RW with ModA. Focus of session on ADL retraining, use of AE, UB/LB strengthening. Pt washing UB with setup assist and assist for back, completes sit<>stand at sink during ADL completion with Min-ModA and ModA for maintaining static standing while therapist washes buttocks and while RN applies new bandage. Prior to completing LB bathing Pt verbalizing need to void bladder; completes stand pivot w/c<>BSC with ModA at RW level, ModA to maintain static standing while Pt completes peri-care. Pt washing LB with assist to wash RLE due to hip precautions. Pt donning pants using reacher with MinA to thread over LEs, increased time and verbal cues for technique throughout; ModA for standing balance and assist to advance over hips. Pt washing hands while standing at sink with ModA for balance and Pt utilizing single UE support. Pt transfers back to recliner in manner described above. Engages in seated UE/LE exercises for 10-15 reps each motion. Pt left seated in recliner, QRB donned and chair alarm set, needs within reach.   Therapy Documentation Precautions:  Precautions Precautions: Fall, Posterior Hip Precaution Comments: pt recalls 2/3 posterior hip precautions; sign posted in room Restrictions Weight Bearing  Restrictions: Yes RLE Weight Bearing: Non weight bearing   Pain: Pain Assessment Pain Assessment: No/denies pain       See Function Navigator for Current Functional Status.   Therapy/Group: Individual Therapy  Raymondo Band 06/06/2017, 12:29 PM

## 2017-06-06 NOTE — Progress Notes (Addendum)
Occupational Therapy Session Note  Patient Details  Name: Maureen Morales MRN: [id number] Date of Birth: October 24, 1922  Today's Date: 06/06/2017 OT Individual Time: 8466-5993 OT Individual Time Calculation (min): 33 min    Short Term Goals: Week 1:  OT Short Term Goal 1 (Week 1): Pt will perform toilet transfer with mod A. OT Short Term Goal 2 (Week 1): Pt will perform LB dressing with mod A. OT Short Term Goal 3 (Week 1): Pt will engage in 15 minutes of functional task with less than 2 rest breaks needed.   Skilled Therapeutic Interventions/Progress Updates: Patient in bed resting and c/o fatigue but concurred to work on upper body strengthening and transfers.   After completing bed to recliner transfer (stand pivot to her left unaffected side with close S and keeping within non weight bearing precautions), she c/o, "I am too old and tired to do anymore.   I have worked all day.   I am sorry, honey, but I am done for the day.  Can you jiust let me be?"  Patient was assisted with cleansing hands with soap and wash cloth while seated in recliner as she stated her daughter would be bringing her in dinner momentarily.  This clinician assissted patient with elevating her legs in recliner, and nurse tech came in and fastened patient's recliner safety belt and chair alarm and reminded patient to call for assistance if she needed to get out of her chair or anything else.  Daughter arrived with patient dinner as this clinician left patient room.     Therapy Documentation Precautions:  Fall Risk Total Hip Precautions. General: General OT Amount of Missed Time: 27 Minutes(27) patient complained of fatigue  Pain:denied   Therapy/Group: Individual Therapy  Alfredia Ferguson [facility] 06/06/2017, 5:46 PM

## 2017-06-06 NOTE — Progress Notes (Signed)
Physical Therapy Weekly Progress Note  Patient Details  Name: Maureen Morales MRN: [id number] Date of Birth: 02-Oct-1922  Beginning of progress report period: May 31, 2017 End of progress report period: June 06, 2017   Patient has met 3 of 3 short term goals.  Pt has improved strength, transfers and w/c mobility and is able to perform transfers with min A, w/c mobility with min A/supervision.  Patient continues to demonstrate the following deficits muscle weakness and decreased standing balance and decreased balance strategies and therefore will continue to benefit from skilled PT intervention to increase functional independence with mobility.  Patient not progressing toward long term goals.  See goal revision. goals downgraded to min A due to balance deficits with NWB status.  Continue plan of care.  PT Short Term Goals Week 1:  PT Short Term Goal 1 (Week 1): Pt will be able to perform functional bed <> chair transfers with mod assist PT Short Term Goal 1 - Progress (Week 1): Met PT Short Term Goal 2 (Week 1): Pt will be able to perform sit <> stand with mod assist wile maintaining NWB status PT Short Term Goal 2 - Progress (Week 1): Met PT Short Term Goal 3 (Week 1): Pt will be able to propel w/c x 50' with min assist PT Short Term Goal 3 - Progress (Week 1): Met Week 2:  PT Short Term Goal 1 (Week 2): = LTG        See Function Navigator for Current Functional Status.   Claretha Townshend 06/06/2017, 10:29 AM

## 2017-06-06 NOTE — Progress Notes (Signed)
Physical Therapy Note  Patient Details  Name: Maureen Morales MRN: [id number] Date of Birth: 1923-01-02 Today's Date: 06/06/2017    Time: 830-925 55 minutes  1:1 Pt with no c/o pain at rest, pt c/o heel pain in Rt heel with mobility, eases with rest.  Session focuses on sit to stand and transfer training.  Bed mobility with min A for Rt LE.  Sit to stand multiple reps throughout session with min A with RW.  Transfers with RW with min A.  Squat pivot transfers to car (simulated sedan height), BSC and recliner with min A.  Standing tolerance for changing clothes, hygiene after incontinence of bowel episode and after continent bladder episode. Pt able to stand 2-3 minutes before fatigue. Pt able to stand to brush teeth for 30 seconds with 1 UE support before fatigue. Pt left in recliner with RN present, chair alarm on, needs at hand.   Maureen Morales 06/06/2017, 9:26 AM

## 2017-06-07 ENCOUNTER — Inpatient Hospital Stay (HOSPITAL_COMMUNITY): Payer: Medicare Other | Admitting: Occupational Therapy

## 2017-06-07 ENCOUNTER — Ambulatory Visit: Payer: Medicare Other | Admitting: General Surgery

## 2017-06-07 ENCOUNTER — Inpatient Hospital Stay (HOSPITAL_COMMUNITY): Payer: Medicare Other

## 2017-06-07 LAB — GLUCOSE, CAPILLARY
GLUCOSE-CAPILLARY: 112 mg/dL — AB (ref 65–99)
Glucose-Capillary: 124 mg/dL — ABNORMAL HIGH (ref 65–99)
Glucose-Capillary: 163 mg/dL — ABNORMAL HIGH (ref 65–99)
Glucose-Capillary: 161 mg/dL — ABNORMAL HIGH (ref 65–99)

## 2017-06-07 MED ORDER — CALCIUM POLYCARBOPHIL 625 MG PO TABS
1250.0000 mg | ORAL_TABLET | Freq: Every day | ORAL | Status: DC
  Administered 2017-06-08 – 2017-06-11 (×4): 1250 mg via ORAL
  Filled 2017-06-07 (×4): qty 2

## 2017-06-07 NOTE — Progress Notes (Signed)
Occupational Therapy Session Note  Patient Details  Name: Maureen Morales MRN: [id number] Date of Birth: Jan 01, 1923  Today's Date: 06/07/2017 OT Individual Time: 1400-1500 OT Individual Time Calculation (min): 60 min     Skilled Therapeutic Interventions/Progress Updates:    Upon entering the room, pt supine in bed with c/o "not feeling well today at all." Pt was agreeable to OT intervention with encouragement. Pt performed supine >sit with min A to EOB. Pt transferred from bed >wheelchair with mod squat pivot. OT assisted pt via wheelchair outside. OT educating pt on progress since evaluation, discussion of precautions, and energy conservation education started. Education to continue. Pt propelled wheelchair 120' with B UE's for strengthening and endurance with min A. Pt remained in wheelchair with quick release belt donned and call bell within reach.   Therapy Documentation Precautions:  Precautions Precautions: Fall, Posterior Hip Precaution Comments: pt recalls 2/3 posterior hip precautions; sign posted in room Restrictions Weight Bearing Restrictions: Yes RLE Weight Bearing: Non weight bearing General:   Vital Signs: Therapy Vitals Temp: 97.6 F (36.4 C) Temp Source: Oral Pulse Rate: 70 Resp: 18 BP: (!) 132/45 Patient Position (if appropriate): Lying Oxygen Therapy SpO2: 100 % O2 Device: Not Delivered  See Function Navigator for Current Functional Status.   Therapy/Group: Individual Therapy  Gypsy Decant 06/07/2017, 4:14 PM

## 2017-06-07 NOTE — Progress Notes (Addendum)
Physical Therapy Note  Patient Details  Name: ELASIA FURNISH MRN: [id number] Date of Birth: 16-Sep-1922 Today's Date: 06/07/2017  1100-1200, 60 min individual tx Pain: none per pt, premedicated NWBing RLE; pt declined TEDs as she states she is allergic to the material.  Pt seated in recliner.  Pt able to state 2/3 posterior hip precautions; she forgot crossing legs/adduction.  Pt  able to state NWB'ing precautions without cues. During transitions of PT removing bil leg rests from w/c, pt frequently attempted to adduct RLE and place it on L legrest rather than placing r foot on floor.  PT provided further ed about hip precautions in a variety of situations.  Sit> stand to RW from low recliner with max assist. W/c propulsion using bil UEs on level tile x 50' with supervision.   Therapeutic exercises performed with LE to increase strength for functional mobility: seated UBE forward/backward x 4 min approx, low resistance, for increasing UE function with RW.  Pt c/o feeling "warm" , and in about 5 min, "weak". As PT awaited a therapist to bring a BP cuff, pt stated "I think I need to lie down".   Pt quickly brought to her room.  +2 for squat pivot to return to bed.  Pt dozed immediately.  BP in supine = 171/53, HR 68, O2 sats = 99%.  BS = 112.    After resting 5 min, pt said she felt fine and wanted to continue tx.  Supine 10 x 1 each R/L straight leg raises, L unilateral bridging, with prolonged rest breaks between sets. Pt scooted herself up to HOb using bil hands on rails, min assist.  Pt stated she felt weak again, and wanted to eat.  Lunch tray set up for pt in bed; alarm set and all needs at hand.  See function navigator for current status.  Zoelle Markus 06/07/2017, 11:32 AM

## 2017-06-07 NOTE — Progress Notes (Signed)
Occupational Therapy Weekly Progress Note  Patient Details  Name: Maureen Morales MRN: [id number] Date of Birth: 1923/02/17  Beginning of progress report period: May 31, 2017 End of progress report period: June 07, 2017  Today's Date: 06/07/2017 OT Individual Time: 0900-1015 OT Individual Time Calculation (min): 75 min    Patient has met 3 of 3 short term goals. Pt consistently completes functional transfers at [place] level with [place] and is progressing towards MinA, with good maintenance of NWB status throughout. Pt has some difficulty with use of reacher for LB dressing but making progress, able to complete task with ModA. Pt also demonstrating increased activity tolerance requiring fewer rest breaks.   Patient continues to demonstrate the following deficits: muscle weakness and decreased standing balance, decreased balance strategies and difficulty maintaining precautions (requiring min cues for maintaining posterior hip precautions) and therefore will continue to benefit from skilled OT intervention to enhance overall performance with BADL and Reduce care partner burden.  Patient progressing toward long term goals..  Continue plan of care.  OT Short Term Goals Week 2:  OT Short Term Goal 1 (Week 2): STG=LTG 2/2 ELOS  Skilled Therapeutic Interventions/Progress Updates:    Pt presents supine in bed with no c/o pain agreeable to OT tx session. Pt completing stand pivot transfers throughout session with Min-ModA using RW. Pt completing bathing, dressing and grooming ADLs from w/c level at sink. Pt fluctuating between Min-ModA for sit<>stand and maintaining standing balance to wash perineal area/buttocks and to complete LB dressing. Pt completing UB bathing/dressing with overall supervision/setup assist. Pt with some difficulty using reacher to thread LEs into pants, but is able to complete with increased time and minA/verbal cues to thread RLE. Pt assists therapist with advancing pants over  hips in standing but requires ModA to complete. Pt using sock aide to don L sock with increased time/verbal cues for AE use. Pt transfers to recliner, additional focus on taking small hop forward and back while standing at RW as Pt has difficulty when backing up to seated surface during transfers. Pt able to take very small hop forward/back this session with ModA. Pt left seated in recliner end of session, QRB donned and chair alarm set, needs met.   Therapy Documentation Precautions:  Precautions Precautions: Fall, Posterior Hip Precaution Comments: pt recalls 2/3 posterior hip precautions; sign posted in room Restrictions Weight Bearing Restrictions: Yes RLE Weight Bearing: Non weight bearing   Pain: Pain Assessment Pain Assessment: No/denies pain    See Function Navigator for Current Functional Status.   Therapy/Group: Individual Therapy  Raymondo Band 06/07/2017, 12:29 PM

## 2017-06-07 NOTE — Progress Notes (Signed)
Social Work Patient ID: Maureen Morales, female   DOB: 10-Nov-1922, 84 y.o.   MRN: [id number]    Rexene Alberts  Social Worker  General Practice  Patient Care Conference  Signed  Date of Service:  06/07/2017 11:43 AM          Signed          [] Hide copied text  [] Hover for details   Inpatient RehabilitationTeam Conference and Plan of Care Update Date: 06/06/2017   Time: 2:45 PM      Patient Name: Maureen Morales      Medical Record Number: [id number]  Date of Birth: Jul 10, 1922 Sex: Female         Room/Bed: 4M01C/4M01C-01 Payor Info: Payor: MEDICARE / Plan: MEDICARE PART A AND B / Product Type: *No Product type* /     Admitting Diagnosis: Rt Periprosthetic hip fx  Admit Date/Time:  05/30/2017  7:46 PM Admission Comments: No comment available    Primary Diagnosis:  <principal problem not specified> Principal Problem: <principal problem not specified>       Patient Active Problem List    Diagnosis Date Noted  . Hypokalemia    . Hypoalbuminemia due to protein-calorie malnutrition ([place])    . Intermittent asthma without complication    . Stage 3 chronic kidney disease ([place])    . Chronic diastolic congestive heart failure ([place])    . Periprosthetic fracture around internal prosthetic right hip joint ([place]) 05/30/2017  . Pressure injury of right heel, unstageable ([place])    . Anal fistula    . History of right hip hemiarthroplasty    . Benign essential HTN    . Acute blood loss anemia    . Diabetes mellitus type 2 in nonobese (HCC)    . AKI (acute kidney injury) ([place])    . Post-operative pain    . Fall 05/26/2017  . Closed right hip fracture ([place]) 05/26/2017  . Normocytic anemia 05/26/2017  . Abdominal distention 05/26/2017  . CAD (coronary artery disease) 05/26/2017  . Depression    . HLD (hyperlipidemia)    . Essential hypertension    . Periprosthetic fracture around internal prosthetic hip joint, initial encounter    . Closed displaced fracture of right femoral  neck ([place]) 02/16/2017  . Abnormal CT scan, colon    . Generalized anxiety disorder 09/07/2016  . Unsteady gait 09/07/2016  . NSTEMI (non-ST elevated myocardial infarction) ([place]) 08/30/2016  . Pain in joint, ankle and foot 06/24/2014  . S/P laparoscopic cholecystectomy 12/15/2013  . Preoperative general physical examination 12/15/2013  . Encounter for preventive health examination 11/13/2013  . Cervical spine disease 01/27/2013  . Anemia, iron deficiency 01/21/2013  . Right medial knee pain 01/09/2013  . Back pain, thoracic 08/27/2012  . Aortic stenosis, mild 05/06/2012  . Asthma 07/04/2011  . Hemorrhoids, internal 04/05/2011  . Nummular eczema 01/16/2011  . Essential hypertension    . Type II diabetes mellitus with nephropathy ([place])    . Hyperlipidemia        Expected Discharge Date: Expected Discharge Date: 06/13/17   Team Members Present: Physician leading conference: Dr. Delice Lesch Social Worker Present: Lennart Pall, LCSW Nurse Present: Junius Creamer, RN PT Present: Roderic Ovens, PT OT Present: [name], OT SLP Present: Windell Moulding, SLP       Current Status/Progress Goal Weekly Team Focus  Medical     Deficits with mobility, transfers, and self-care secondary to right peri-prosthetic hip fracture  Improve mobility, transfers, safety, HTN/DM,  hypokalemia, stool freq  See above   Bowel/Bladder     continent of b/b, LBM 2/5, having frequent loose stools, fibercon started 2/4 with some relief, 2 bm's on 2/5.  maintain continence with Mod I assist  monitor b/b q shift and prn, medication as ordered.   Swallow/Nutrition/ Hydration               ADL's     mod A toileting, min -mod A LB self care, min stand pivot transfers  min A overall  pt and family education on adaptive strategies and AE with ADL training   Mobility     mod A transfers  min A overall  activity tolerance, caregiver training, transfers   Communication               Safety/Cognition/ Behavioral  Observations             Pain     occasional c/o of scalp pain or rectal pain, relief with norco prn  pain <4   monitor pain q shift and prn, medication as necessary   Skin     Staples to R hip OTA, right heel blister, buttocks wound with dry dressing daily  maintain skin integrity while on IPR with cuing assist  monitor/treat skin issues q shift and prn per orders, foam to right heel, dry dressing to buttocks     Rehab Goals Patient on target to meet rehab goals: Yes *See Care Plan and progress notes for long and short-term goals.      Barriers to Discharge   Current Status/Progress Possible Resolutions Date Resolved   Physician     Medical stability;Weight bearing restrictions     See above  Therapies, optimize DM/HTN meds, fibercon, supplement potassium      Nursing                 PT                    OT                 SLP            SW              Discharge Planning/Teaching Needs:  Plan for pt to d/c home with 24/7 caregiver in place already.  Teaching with caregiver if needed.   Team Discussion:  BP beginning to stabilize;  Still having frequent loose stools;  Blister to heel;  Needs constant reminders to maintain WB precautions.  Min - mod assist transfers and ADLs with goals set for min assist overall.  Revisions to Treatment Plan:  None    Continued Need for Acute Rehabilitation Level of Care: The patient requires daily medical management by a physician with specialized training in physical medicine and rehabilitation for the following conditions: Daily direction of a multidisciplinary physical rehabilitation program to ensure safe treatment while eliciting the highest outcome that is of practical value to the patient.: Yes Daily medical management of patient stability for increased activity during participation in an intensive rehabilitation regime.: Yes Daily analysis of laboratory values and/or radiology reports with any subsequent need for medication  adjustment of medical intervention for : Post surgical problems;Other;Blood pressure problems;Diabetes problems   Maureen Morales, [place] 06/07/2017, 11:43 AM

## 2017-06-07 NOTE — Patient Care Conference (Signed)
Inpatient RehabilitationTeam Conference and Plan of Care Update Date: 06/06/2017   Time: 2:45 PM    Patient Name: Maureen Morales      Medical Record Number: [id number]  Date of Birth: [birth year] Sex: Female         Room/Bed: 4M01C/4M01C-01 Payor Info: Payor: MEDICARE / Plan: MEDICARE PART A AND B / Product Type: *No Product type* /    Admitting Diagnosis: Rt Periprosthetic hip fx  Admit Date/Time:  05/30/2017  7:46 PM Admission Comments: No comment available   Primary Diagnosis:  <principal problem not specified> Principal Problem: <principal problem not specified>  Patient Active Problem List   Diagnosis Date Noted  . Hypokalemia   . Hypoalbuminemia due to protein-calorie malnutrition ([place])   . Intermittent asthma without complication   . Stage 3 chronic kidney disease ([place])   . Chronic diastolic congestive heart failure ([place])   . Periprosthetic fracture around internal prosthetic right hip joint ([place]) 05/30/2017  . Pressure injury of right heel, unstageable ([place])   . Anal fistula   . History of right hip hemiarthroplasty   . Benign essential HTN   . Acute blood loss anemia   . Diabetes mellitus type 2 in nonobese (HCC)   . AKI (acute kidney injury) ([place])   . Post-operative pain   . Fall 05/26/2017  . Closed right hip fracture ([place]) 05/26/2017  . Normocytic anemia 05/26/2017  . Abdominal distention 05/26/2017  . CAD (coronary artery disease) 05/26/2017  . Depression   . HLD (hyperlipidemia)   . Essential hypertension   . 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 Closed displaced fracture of right femoral neck ([place]) 02/16/2017  . Abnormal CT scan, colon   . Generalized anxiety disorder 09/07/2016  . Unsteady gait 09/07/2016  . NSTEMI (non-ST elevated myocardial infarction) ([place]) 08/30/2016  . Pain in joint, ankle and foot 06/24/2014  . S/P laparoscopic cholecystectomy 12/15/2013  . Preoperative general physical examination  12/15/2013  . Encounter for preventive health examination 11/13/2013  . Cervical spine disease 01/27/2013  . Anemia, iron deficiency 01/21/2013  . Right medial knee pain 01/09/2013  . Back pain, thoracic 08/27/2012  . Aortic stenosis, mild 05/06/2012  . Asthma 07/04/2011  . Hemorrhoids, internal 04/05/2011  . Nummular eczema 01/16/2011  . Essential hypertension   . Type II diabetes mellitus with nephropathy ([place])   . Hyperlipidemia     Expected Discharge Date: Expected Discharge Date: 06/13/17  Team Members Present: Physician leading conference: Dr. Delice Lesch Social Worker Present: Lennart Pall, LCSW Nurse Present: Junius Creamer, RN PT Present: Roderic Ovens, PT OT Present: [name], OT SLP Present: Windell Moulding, SLP     Current Status/Progress Goal Weekly Team Focus  Medical   Deficits with mobility, transfers, and self-care secondary to right peri-prosthetic hip fracture  Improve mobility, transfers, safety, HTN/DM, hypokalemia, stool freq  See above   Bowel/Bladder   continent of b/b, LBM 2/5, having frequent loose stools, fibercon started 2/4 with some relief, 2 bm's on 2/5.  maintain continence with Mod I assist  monitor b/b q shift and prn, medication as ordered.   Swallow/Nutrition/ Hydration             ADL's   mod A toileting, min -mod A LB self care, min stand pivot transfers  min A overall  pt and family education on adaptive strategies and AE with ADL training   Mobility   mod A transfers  min A overall  activity tolerance,  caregiver training, transfers   Communication             Safety/Cognition/ Behavioral Observations            Pain   occasional c/o of scalp pain or rectal pain, relief with norco prn  pain <4   monitor pain q shift and prn, medication as necessary   Skin   Staples to R hip OTA, right heel blister, buttocks wound with dry dressing daily  maintain skin integrity while on IPR with cuing assist  monitor/treat skin issues q shift  and prn per orders, foam to right heel, dry dressing to buttocks    Rehab Goals Patient on target to meet rehab goals: Yes *See Care Plan and progress notes for long and short-term goals.     Barriers to Discharge  Current Status/Progress Possible Resolutions Date Resolved   Physician    Medical stability;Weight bearing restrictions     See above  Therapies, optimize DM/HTN meds, fibercon, supplement potassium      Nursing                  PT                    OT                  SLP                SW                Discharge Planning/Teaching Needs:  Plan for pt to d/c home with 24/7 caregiver in place already.  Teaching with caregiver if needed.   Team Discussion:  BP beginning to stabilize;  Still having frequent loose stools;  Blister to heel;  Needs constant reminders to maintain WB precautions.  Min - mod assist transfers and ADLs with goals set for min assist overall.  Revisions to Treatment Plan:  None    Continued Need for Acute Rehabilitation Level of Care: The patient requires daily medical management by a physician with specialized training in physical medicine and rehabilitation for the following conditions: Daily direction of a multidisciplinary physical rehabilitation program to ensure safe treatment while eliciting the highest outcome that is of practical value to the patient.: Yes Daily medical management of patient stability for increased activity during participation in an intensive rehabilitation regime.: Yes Daily analysis of laboratory values and/or radiology reports with any subsequent need for medication adjustment of medical intervention for : Post surgical problems;Other;Blood pressure problems;Diabetes problems  Layia Walla, [place] 06/07/2017, 11:43 AM

## 2017-06-07 NOTE — Progress Notes (Signed)
[place] PHYSICAL MEDICINE & REHABILITATION     PROGRESS NOTE  Subjective/Complaints:  Pt seen lying in bed this AM.  She slept well overnight.  She is happy to know she is going home next week.    ROS: Denies CP, SOB, nausea, vomiting, diarrhea.  Objective: Vital Signs: Blood pressure (!) 148/56, pulse 71, temperature 97.8 F (36.6 C), temperature source Oral, resp. rate 18, height 5\' 1"  (1.549 m), weight 61 kg (134 lb 7.7 oz), SpO2 96 %. No results found. No results for input(s): WBC, HGB, HCT, PLT in the last 72 hours. No results for input(s): NA, K, CL, GLUCOSE, BUN, CREATININE, CALCIUM in the last 72 hours.  Invalid input(s): CO CBG (last 3)  Recent Labs    06/06/17 1633 06/06/17 2110 06/07/17 0643  GLUCAP 121* 173* 124*    Wt Readings from Last 3 Encounters:  06/06/17 61 kg (134 lb 7.7 oz)  05/22/17 57.2 kg (126 lb)  05/14/17 58.1 kg (128 lb)    Physical Exam:  BP (!) 148/56   Pulse 71   Temp 97.8 F (36.6 C) (Oral)   Resp 18   Ht 5\' 1"  (1.549 m)   Wt 61 kg (134 lb 7.7 oz)   SpO2 96%   BMI 25.41 kg/m  Constitutional: She appears well-developed and well-nourished. No distress.  HENT: Normocephalic and atraumatic.  Eyes: EOM are normal. No discharge.  Cardiovascular: RRR. Murmur heard. No JVD. Respiratory: Effort normal. Clear.  GI: Bowel sounds are normal. She exhibits no distension.  Musculoskeletal: Edema right hip and thigh, continues to improve Neurological: She is alert and oriented.  Motor: B/l UE: 4/5 proximal to distal RLE: HF, 4/5, ADF/PF 4+/5  LLE: HF, KE 4+/5, ADF/PF 4+/5  Skin: Right thigh incision with staples c/d/i Right heel blister.   Psychiatric: She has a normal mood and affect. Her behavior is normal.    Assessment/Plan: 1. Functional deficits secondary to right peri-prosthetic hip fracture which require 3+ hours per day of interdisciplinary therapy in a comprehensive inpatient rehab setting. Physiatrist is providing close team  supervision and 24 hour management of active medical problems listed below. Physiatrist and rehab team continue to assess barriers to discharge/monitor patient progress toward functional and medical goals.  Function:  Bathing Bathing position Bathing activity did not occur: Safety/medical concerns Position: Wheelchair/chair at sink  Bathing parts Body parts bathed by patient: Right arm, Left arm, Chest, Abdomen, Front perineal area, Right upper leg, Left upper leg, Left lower leg Body parts bathed by helper: Right lower leg, Back, Buttocks  Bathing assist Assist Level: Touching or steadying assistance(Pt > 75%)      Upper Body Dressing/Undressing Upper body dressing   What is the patient wearing?: Button up shirt, Bra Bra - Perfomed by patient: Thread/unthread right bra strap, Thread/unthread left bra strap, Hook/unhook bra (pull down sports bra)       Button up shirt - Perfomed by patient: Thread/unthread right sleeve, Thread/unthread left sleeve, Pull shirt around back, Button/unbutton shirt      Upper body assist Assist Level: Set up      Lower Body Dressing/Undressing Lower body dressing Lower body dressing/undressing activity did not occur: Safety/medical concerns What is the patient wearing?: Pants, Non-skid slipper socks   Underwear - Performed by helper: Thread/unthread right underwear leg, Thread/unthread left underwear leg, Pull underwear up/down Pants- Performed by patient: Thread/unthread left pants leg, Thread/unthread right pants leg Pants- Performed by helper: Pull pants up/down   Non-skid slipper socks- Performed by  helper: Don/doff right sock, Don/doff left sock       Shoes - Performed by helper: Don/doff left shoe(L foot only)          Lower body assist Assist for lower body dressing: (total assist)      Toileting Toileting   Toileting steps completed by patient: Performs perineal hygiene Toileting steps completed by helper: Adjust clothing prior to  toileting, Performs perineal hygiene, Adjust clothing after toileting Toileting Assistive Devices: Grab bar or rail  Toileting assist Assist level: Touching or steadying assistance (Pt.75%)   Transfers Chair/bed transfer Chair/bed transfer activity did not occur: Safety/medical concerns(Hg 7.0 - medically unstable) Chair/bed transfer method: Stand pivot Chair/bed transfer assist level: Moderate assist (Pt 50 - 74%/lift or lower) Chair/bed transfer assistive device: Armrests, Walker     Locomotion Ambulation Ambulation activity did not occur: Safety/medical concerns(Hg 7.0 - medically unstable)   Max distance: 4 ft Assist level: Moderate assist (Pt 50 - 74%)   Wheelchair Wheelchair activity did not occur: Safety/medical concerns(Hg 7.0 - medically unstable) Type: Manual Max wheelchair distance: 120 Assist Level: Touching or steadying assistance (Pt > 75%)  Cognition Comprehension Comprehension assist level: Follows complex conversation/direction with no assist  Expression Expression assist level: Expresses complex ideas: With no assist  Social Interaction Social Interaction assist level: Interacts appropriately with others with medication or extra time (anti-anxiety, antidepressant).  Problem Solving Problem solving assist level: Solves complex problems: Recognizes & self-corrects  Memory Memory assist level: Complete Independence: No helper    Medical Problem List and Plan: 1.  Deficits with mobility, transfers, and self-care secondary to right peri-prosthetic hip fracture.    Continue CIR   D/c staples 2.  DVT Prophylaxis/Anticoagulation: Mechanical: Sequential compression devices, below knee Bilateral lower extremities--no blood thinners due to history of hematochezia.    Vascular study negative for DVT on 1/31 3. Pain Management: oxycodone prn 4. Mood: team to provide ego support to help manage pain and anxiety 5. Neuropsych: This patient is capable of making decisions on her  own behalf. 6. Skin/Wound Care: Monitor scalp wound, anal wound and right hip incision for healing.  7. Fluids/Electrolytes/Nutrition: Monitor I/Os.  8. ABLA:    Hemoglobin 9.8 on 2/4   Labs ordered for tomorrow   Cont to monitor 9. HTN: Monitor BP bid--continue Cozaar.   Toprolol increased on 2/3   Hydralazine 10 TID started on 2/5    Vitals:   06/07/17 0509 06/07/17 0622  BP: (!) 148/56 (!) 148/56  Pulse: 71   Resp: 18   Temp: 97.8 F (36.6 C)   SpO2: 96%     Improving on 2/7 10. T2DM: Hgb A1C-7.0. BS    Will d/c Lantus 2/5    Discussed with pharmacy, will change to Glucatrol 2.5 daily (PTA med) on 2/5    CBG (last 3)  Recent Labs    06/06/17 1633 06/06/17 2110 06/07/17 0643  GLUCAP 121* 173* 124*    Relatively controlled on 2/7 11. H/o Asthma: Scheduled nebs qid, changed to PRN. 12. Right heel ulcer: Ordered prevalon boot. Foam dressing. Added protein supplement to help with wound healing.  13. Recent Anal fistulotomy: Keep area clean and dry.  14. Chronic renal failure?: Baseline SCr around 1.2?    Creatinine 0.97 on 2/4    Labs ordered for tomorrow 15. Right hemiarthroplasty: She is to continue right total hip precautions as well as  NWB due to recent surgery.  16. CAD with moderate aortic stenosis/Grade 2 diastolic dysfunction: Monitor daily  weights. On lipitor, Cozaar and Toprol. Avoid overload.  Filed Weights   06/03/17 0045 06/05/17 0505 06/06/17 0319  Weight: 62.2 kg (137 lb 2 oz) 60.9 kg (134 lb 4.2 oz) 61 kg (134 lb 7.7 oz)  17. Hypoalbuminemia  Supplement initiated 18. Hypokalemia  K+ 3.3 on 2/4, supplemented x1 day   Labs ordered for tomorrow   Cont to montor 19. Relatively freq stools per pt   No diarhhea per report and pt  Fiber added 2/4, increased on 2/7  LOS (Days) 8 A FACE TO FACE EVALUATION WAS PERFORMED  Jadden Yim Lorie Phenix 06/07/2017 8:15 AM

## 2017-06-07 NOTE — Progress Notes (Signed)
Social Work Patient ID: Maureen Morales, female   DOB: 09/27/1922, 84 y.o.   MRN: [id number]   Have reviewed team conference with pt and daughter, [name] who are aware and agreeable with targeted d/c date of 2/13 and min assist goals overall.  Reviewed plans for [facility] and requesting [facility] again.  Continue to follow.  Mliss Wedin, LCSW

## 2017-06-08 ENCOUNTER — Inpatient Hospital Stay (HOSPITAL_COMMUNITY): Payer: Medicare Other | Admitting: Occupational Therapy

## 2017-06-08 ENCOUNTER — Inpatient Hospital Stay (HOSPITAL_COMMUNITY): Payer: Medicare Other | Admitting: Physical Therapy

## 2017-06-08 LAB — BASIC METABOLIC PANEL
ANION GAP: 13 (ref 5–15)
BUN: 31 mg/dL — ABNORMAL HIGH (ref 6–20)
CO2: 20 mmol/L — AB (ref 22–32)
Calcium: 8.7 mg/dL — ABNORMAL LOW (ref 8.9–10.3)
Chloride: 106 mmol/L (ref 101–111)
Creatinine, Ser: 1.18 mg/dL — ABNORMAL HIGH (ref 0.44–1.00)
GFR calc non Af Amer: 39 mL/min — ABNORMAL LOW (ref >60)
GFR, EST AFRICAN AMERICAN: 45 mL/min — AB (ref >60)
Glucose, Bld: 126 mg/dL — ABNORMAL HIGH (ref 65–99)
POTASSIUM: 4 mmol/L (ref 3.5–5.1)
Sodium: 139 mmol/L (ref 135–145)

## 2017-06-08 LAB — CBC WITH DIFFERENTIAL/PLATELET
BASOS PCT: 0 %
Basophils Absolute: 0 10*3/uL (ref 0.0–0.1)
Eosinophils Absolute: 0.5 10*3/uL (ref 0.0–0.7)
Eosinophils Relative: 5 %
HEMATOCRIT: 30.5 % — AB (ref 36.0–46.0)
HEMOGLOBIN: 9.7 g/dL — AB (ref 12.0–15.0)
LYMPHS ABS: 1.3 10*3/uL (ref 0.7–4.0)
Lymphocytes Relative: 16 %
MCH: 27.9 pg (ref 26.0–34.0)
MCHC: 31.8 g/dL (ref 30.0–36.0)
MCV: 87.6 fL (ref 78.0–100.0)
MONOS PCT: 9 %
Monocytes Absolute: 0.7 10*3/uL (ref 0.1–1.0)
NEUTROS PCT: 70 %
Neutro Abs: 5.9 10*3/uL (ref 1.7–7.7)
Platelets: 468 10*3/uL — ABNORMAL HIGH (ref 150–400)
RBC: 3.48 MIL/uL — ABNORMAL LOW (ref 3.87–5.11)
RDW: 14.4 % (ref 11.5–15.5)
WBC: 8.4 10*3/uL (ref 4.0–10.5)

## 2017-06-08 LAB — GLUCOSE, CAPILLARY
GLUCOSE-CAPILLARY: 106 mg/dL — AB (ref 65–99)
GLUCOSE-CAPILLARY: 165 mg/dL — AB (ref 65–99)
Glucose-Capillary: 127 mg/dL — ABNORMAL HIGH (ref 65–99)
Glucose-Capillary: 98 mg/dL (ref 65–99)

## 2017-06-08 NOTE — Progress Notes (Signed)
Physical Therapy Session Note  Patient Details  Name: Maureen Morales MRN: [id number] Date of Birth: Mar 23, 1923  Today's Date: 06/08/2017 PT Individual Time: 1300-1400 PT Individual Time Calculation (min): 60 min   Short Term Goals: Week 2:  PT Short Term Goal 1 (Week 2): = LTG  Skilled Therapeutic Interventions/Progress Updates:    Pt seated in recliner in room, agreeable to participate in therapy session. Pt reports no pain this PM. Sit to stand Min Assist to RW, stand pivot transfer recliner to w/c with Mod Assist and RW, v/c to adhere to [facility] precautions on RLE. Pt is able to recall 3/3 hip precautions with increased time. Manual w/c propulsion x 100 ft with Min Assist for steering. Sit to/from stand 2 x 5 reps to RW with focus on hand placement during transfer and NWB on RLE, Min Assist. Standing tolerance x 4 min while completing UE task, one UE on RW and Min Assist for balance. Pt is able to amb x 5 ft forwards with RW with Min Assist for balance, is unable to hop backwards due to BUE and LLE weakness and decreased confidence in stability of RW. Practice hopping forwards/backwards in // bars with Min Assist, pt feels more confident and is able to get better UE leverage with use of // bars. Seated BLE therex x 10 reps. Pt left seated in w/c with needs in reach, quick-release belt in place.  Therapy Documentation Precautions:  Precautions Precautions: Fall, Posterior Hip Precaution Comments: pt recalls 2/3 posterior hip precautions; sign posted in room Restrictions Weight Bearing Restrictions: Yes RLE Weight Bearing: Non weight bearing  See Function Navigator for Current Functional Status.   Therapy/Group: Individual Therapy  Excell Seltzer, PT, DPT  06/08/2017, 4:17 PM

## 2017-06-08 NOTE — Progress Notes (Signed)
Occupational Therapy Session Note  Patient Details  Name: Maureen Morales MRN: [id number] Date of Birth: [birth year]  Today's Date: 06/08/2017 OT Individual Time: 0900-0930 OT Individual Time Calculation (min): 30 min    Short Term Goals: Week 2:  OT Short Term Goal 1 (Week 2): STG=LTG 2/2 ELOS  Skilled Therapeutic Interventions/Progress Updates:    1:1 Focus on toileting and toilet transfer with RW. PT required mod A with RW to get onto toilet with A for clothing management. Pt able to perform hygiene. Min A to return to w/c with extra time for pivoting. Pt declined showering today just wanted to "freshen up" at the sink and dress. PT able to thread LB clothing with reacher with extra time and instructional cues. Pt able to perform sit to stand with min guard when she pushes up from w/c with bilateral UEs. Pt able to perform UB dressing with setup. Left pt up in w/c in prep for next session.   Therapy Documentation Precautions:  Precautions Precautions: Fall, Posterior Hip Precaution Comments: pt recalls 2/3 posterior hip precautions; sign posted in room Restrictions Weight Bearing Restrictions: Yes RLE Weight Bearing: Non weight bearing Pain: No c/o pain in session  See Function Navigator for Current Functional Status.   Therapy/Group: Individual Therapy  [name] [facility] 06/08/2017, 5:14 PM

## 2017-06-08 NOTE — Progress Notes (Signed)
Occupational Therapy Session Note  Patient Details  Name: Maureen Morales MRN: [id number] Date of Birth: [birth year]  Today's Date: 06/08/2017 OT Individual Time: 1401-1443 OT Individual Time Calculation (min): 42 min    Short Term Goals: Week 2:  OT Short Term Goal 1 (Week 2): STG=LTG 2/2 ELOS  Skilled Therapeutic Interventions/Progress Updates:    Treatment session with focus on toilet transfer and LB clothing management.  Upon arrival, pt reports need to toilet.  Completed stand pivot transfer to toilet with min assist, min cues for hand placement to increase independence with sit > stand.  Pt attempted hygiene in standing, requiring min assist for standing balance and assist for thoroughness post BM.  Pt donned new pants post toileting with use of reacher, therapist providing min cues for sequencing and problem solving while donning pants.  Completed hand hygiene in standing for increased activity tolerance.  Returned to recliner via stand pivot with min assist.  Therapy Documentation Precautions:  Precautions Precautions: Fall, Posterior Hip Precaution Comments: pt recalls 2/3 posterior hip precautions; sign posted in room Restrictions Weight Bearing Restrictions: Yes RLE Weight Bearing: Non weight bearing Pain:  Pt with no c/o pain  See Function Navigator for Current Functional Status.   Therapy/Group: Individual Therapy  [name] 06/08/2017, 3:47 PM

## 2017-06-08 NOTE — Progress Notes (Signed)
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  Pt received seated upright in w/c reporting pain in RLE ?due to positioning.  Engaged in stand pivot transfers w/c <> recliner with and without RW.  Pt with improved sequencing and safety with stand pivot without RW with min assist from therapist at trunk.  Pt with improved sit > stand throughout session.  Pt reports need to toilet.  Completed stand pivot transfer w/c <> toilet with min assist.  Pt required assistance for clothing management.  Returned to recliner and left seated upright with legs elevated for comfort with chair alarm and quick release belt.  Therapy Documentation Precautions:  Precautions Precautions: Fall, Posterior Hip Precaution Comments: pt recalls 2/3 posterior hip precautions; sign posted in room Restrictions Weight Bearing Restrictions: Yes RLE Weight Bearing: Non weight bearing Pain:  Pt with c/o pain 6/10 in Rt leg, premedicated.  Repositioned for pain relief.  See Function Navigator for Current Functional Status.   Therapy/Group: Individual Therapy  [name] 06/08/2017, 12:30 PM

## 2017-06-08 NOTE — Progress Notes (Signed)
[place] PHYSICAL MEDICINE & REHABILITATION     PROGRESS NOTE  Subjective/Complaints:  Patient seen lying in bed this morning. She states she slept well overnight. She states she had pain in her leg yesterday but it was relieved with Tylenol.  ROS: Denies CP, SOB, nausea, vomiting, diarrhea.  Objective: Vital Signs: Blood pressure (!) 159/67, pulse 71, temperature 98.3 F (36.8 C), temperature source Oral, resp. rate 17, height 5\' 1"  (1.549 m), weight 61 kg (134 lb 7.7 oz), SpO2 96 %. No results found. Recent Labs    06/08/17 0456  WBC 8.4  HGB 9.7*  HCT 30.5*  PLT 468*   Recent Labs    06/08/17 0456  NA 139  K 4.0  CL 106  GLUCOSE 126*  BUN 31*  CREATININE 1.18*  CALCIUM 8.7*   CBG (last 3)  Recent Labs    06/07/17 1630 06/07/17 2031 06/08/17 0655  GLUCAP 163* 161* 127*    Wt Readings from Last 3 Encounters:  06/06/17 61 kg (134 lb 7.7 oz)  05/22/17 57.2 kg (126 lb)  05/14/17 58.1 kg (128 lb)    Physical Exam:  BP (!) 159/67 (BP Location: Right Arm)   Pulse 71   Temp 98.3 F (36.8 C) (Oral)   Resp 17   Ht 5\' 1"  (1.549 m)   Wt 61 kg (134 lb 7.7 oz)   SpO2 96%   BMI 25.41 kg/m  Constitutional: She appears well-developed and well-nourished. No distress.  HENT: Normocephalic and atraumatic.  Eyes: EOM are normal. No discharge.  Cardiovascular: RRR. Murmur heard. No JVD. Respiratory: Effort normal. Clear.  GI: Bowel sounds are normal. She exhibits no distension.  Musculoskeletal: Edema right hip and thigh, continues to improve slowly Neurological: She is alert and oriented.  Motor: B/l UE: 4/5 proximal to distal RLE: HF, 4/5, ADF/PF 4+/5  LLE: HF, KE 4+/5, ADF/PF 4+/5  Skin: Right thigh incision C/D/I  Right heel blister.   Psychiatric: She has a normal mood and affect. Her behavior is normal.    Assessment/Plan: 1. Functional deficits secondary to right peri-prosthetic hip fracture which require 3+ hours per day of interdisciplinary therapy in  a comprehensive inpatient rehab setting. Physiatrist is providing close team supervision and 24 hour management of active medical problems listed below. Physiatrist and rehab team continue to assess barriers to discharge/monitor patient progress toward functional and medical goals.  Function:  Bathing Bathing position Bathing activity did not occur: Safety/medical concerns Position: Wheelchair/chair at sink  Bathing parts Body parts bathed by patient: Right arm, Left arm, Chest, Abdomen, Front perineal area, Right upper leg, Left upper leg, Left lower leg Body parts bathed by helper: Right lower leg, Back, Buttocks  Bathing assist Assist Level: Touching or steadying assistance(Pt > 75%)      Upper Body Dressing/Undressing Upper body dressing   What is the patient wearing?: Button up shirt, Bra Bra - Perfomed by patient: Thread/unthread right bra strap, Thread/unthread left bra strap, Hook/unhook bra (pull down sports bra)       Button up shirt - Perfomed by patient: Thread/unthread right sleeve, Thread/unthread left sleeve, Pull shirt around back, Button/unbutton shirt      Upper body assist Assist Level: Set up      Lower Body Dressing/Undressing Lower body dressing Lower body dressing/undressing activity did not occur: Safety/medical concerns What is the patient wearing?: Pants, Non-skid slipper socks   Underwear - Performed by helper: Thread/unthread right underwear leg, Thread/unthread left underwear leg, Pull underwear up/down Pants- Performed  by patient: Thread/unthread left pants leg Pants- Performed by helper: Thread/unthread right pants leg, Pull pants up/down Non-skid slipper socks- Performed by patient: Don/doff left sock Non-skid slipper socks- Performed by helper: Don/doff right sock       Shoes - Performed by helper: Don/doff left shoe(L foot only)          Lower body assist Assist for lower body dressing: (total assist)      Toileting Toileting    Toileting steps completed by patient: Performs perineal hygiene Toileting steps completed by helper: Adjust clothing prior to toileting, Performs perineal hygiene, Adjust clothing after toileting Toileting Assistive Devices: Grab bar or rail  Toileting assist Assist level: Touching or steadying assistance (Pt.75%)   Transfers Chair/bed transfer Chair/bed transfer activity did not occur: Safety/medical concerns(Hg 7.0 - medically unstable) Chair/bed transfer method: Squat pivot Chair/bed transfer assist level: Moderate assist (Pt 50 - 74%/lift or lower) Chair/bed transfer assistive device: Armrests     Locomotion Ambulation Ambulation activity did not occur: Safety/medical concerns(Hg 7.0 - medically unstable)   Max distance: 4 ft Assist level: Moderate assist (Pt 50 - 74%)   Wheelchair Wheelchair activity did not occur: Safety/medical concerns(Hg 7.0 - medically unstable) Type: Manual Max wheelchair distance: 120 Assist Level: Touching or steadying assistance (Pt > 75%)  Cognition Comprehension Comprehension assist level: Follows complex conversation/direction with no assist  Expression Expression assist level: Expresses complex ideas: With no assist  Social Interaction Social Interaction assist level: Interacts appropriately with others with medication or extra time (anti-anxiety, antidepressant).  Problem Solving Problem solving assist level: Solves complex problems: Recognizes & self-corrects  Memory Memory assist level: Complete Independence: No helper    Medical Problem List and Plan: 1.  Deficits with mobility, transfers, and self-care secondary to right peri-prosthetic hip fracture.    Continue CIR 2.  DVT Prophylaxis/Anticoagulation: Mechanical: Sequential compression devices, below knee Bilateral lower extremities--no blood thinners due to history of hematochezia.    Vascular study negative for DVT on 1/31 3. Pain Management: oxycodone prn 4. Mood: team to provide ego  support to help manage pain and anxiety 5. Neuropsych: This patient is capable of making decisions on her own behalf. 6. Skin/Wound Care: Monitor scalp wound, anal wound and right hip incision for healing.  7. Fluids/Electrolytes/Nutrition: Monitor I/Os.  8. ABLA:    Hemoglobin 9.7 on 2/8   Cont to monitor 9. HTN: Monitor BP bid--continue Cozaar.   Toprolol increased on 2/3   Hydralazine 10 TID started on 2/5    Vitals:   06/07/17 2108 06/08/17 0325  BP: (!) 132/51 (!) 159/67  Pulse: 69 71  Resp:  17  Temp:  98.3 F (36.8 C)  SpO2:  96%    Slightly labile, but relatively controlled on 2/8 10. T2DM: Hgb A1C-7.0. BS    Will d/c Lantus 2/5    Discussed with pharmacy, will change to Glucatrol 2.5 daily (PTA med) on 2/5    CBG (last 3)  Recent Labs    06/07/17 1630 06/07/17 2031 06/08/17 0655  GLUCAP 163* 161* 127*    Relatively controlled on 2/8 11. H/o Asthma: Scheduled nebs qid, changed to PRN. 12. Right heel ulcer: Ordered prevalon boot. Foam dressing. Added protein supplement to help with wound healing.  13. Recent Anal fistulotomy: Keep area clean and dry.  14. Chronic renal failure?: Baseline SCr around 1.2?    Creatinine 1.18 on 2/8   Encourage fluids   15. Right hemiarthroplasty: She is to continue right total hip precautions as  well as  NWB due to recent surgery.  16. CAD with moderate aortic stenosis/Grade 2 diastolic dysfunction: Monitor daily weights. On lipitor, Cozaar and Toprol. Avoid overload.  Filed Weights   06/03/17 0045 06/05/17 0505 06/06/17 0319  Weight: 62.2 kg (137 lb 2 oz) 60.9 kg (134 lb 4.2 oz) 61 kg (134 lb 7.7 oz)  17. Hypoalbuminemia  Supplement initiated 18. Hypokalemia  K+  4.0 on 2/8 after supplementation    Cont to montor 19. Relatively freq stools per pt   No diarhhea per report and pt  Fiber added 2/4, increased on 2/7  LOS (Days) 9 A FACE TO FACE EVALUATION WAS PERFORMED  Ankit Lorie Phenix 06/08/2017 8:16 AM

## 2017-06-09 ENCOUNTER — Inpatient Hospital Stay (HOSPITAL_COMMUNITY): Payer: Medicare Other | Admitting: Physical Therapy

## 2017-06-09 LAB — GLUCOSE, CAPILLARY
GLUCOSE-CAPILLARY: 123 mg/dL — AB (ref 65–99)
GLUCOSE-CAPILLARY: 175 mg/dL — AB (ref 65–99)
GLUCOSE-CAPILLARY: 73 mg/dL (ref 65–99)
GLUCOSE-CAPILLARY: 94 mg/dL (ref 65–99)

## 2017-06-09 MED ORDER — HYDRALAZINE HCL 25 MG PO TABS
25.0000 mg | ORAL_TABLET | Freq: Three times a day (TID) | ORAL | Status: DC
  Administered 2017-06-09 – 2017-06-13 (×12): 25 mg via ORAL
  Filled 2017-06-09 (×12): qty 1

## 2017-06-09 NOTE — Progress Notes (Signed)
Physical Therapy Session Note  Patient Details  Name: Maureen Morales MRN: [id number] Date of Birth: 09/24/1922  Today's Date: 06/09/2017 PT Individual Time: 0805-0858 PT Individual Time Calculation (min): 53 min   Short Term Goals: Week 2:  PT Short Term Goal 1 (Week 2): = LTG  Skilled Therapeutic Interventions/Progress Updates:  Pt received in bed & agreeable to tx. Pt requesting to wash face. Pt completed bed mobility (supine>sitting EOB) with supervision, HOB elevated, and bed rails. Pt completes squat pivot bed>w/c on R with min assist. Therapist educated pt on proper hand placement and overall technique as pt reported she usually transfers to L side. Set up at sink pt performed grooming tasks (washing face, brushing teeth, washing under arms) with set up assist. Pt required set up assist for doffing night gown and donning bra & new gown. Pt propels towards gym with BUE but frequently only using 1UE at a time. Gait training x 10 ft + 10 ft with RW & min assist with cuing to push walker instead of picking it up, cuing to allow herself enough room to "hop" forwards, and min assist for balance. Pt unable to hop backwards towards sitting surface thus requiring max assist for stand>sit. At end of session pt reported need to use restroom & completed stand pivot w/c>elevated toilet with grab bar and mod assist 2/2 inability to back up to seat to sit safely. Pt with continent void and left sitting on toilet in care of RN.  During session pt able to recall posterior hip precautions & recognize inability to pick something up off floor 2/2 precautions.   Therapy Documentation Precautions:  Precautions Precautions: Fall, Posterior Hip Precaution Comments: pt recalls 2/3 posterior hip precautions; sign posted in room Restrictions Weight Bearing Restrictions: Yes RLE Weight Bearing: Non weight bearing  Pain: Denied c/o pain  See Function Navigator for Current Functional Status.   Therapy/Group:  Individual Therapy  Waunita Schooner 06/09/2017, 9:12 AM

## 2017-06-09 NOTE — Progress Notes (Signed)
[place] PHYSICAL MEDICINE & REHABILITATION     PROGRESS NOTE  Subjective/Complaints:  Patient seen lying in bed this morning. She states she slept well overnight.  ROS: Denies CP, SOB, nausea, vomiting, diarrhea.  Objective: Vital Signs: Blood pressure (!) 137/58, pulse 70, temperature 98 F (36.7 C), temperature source Oral, resp. rate 18, height 5\' 1"  (1.549 m), weight 61.7 kg (136 lb 0.4 oz), SpO2 98 %. No results found. Recent Labs    06/08/17 0456  WBC 8.4  HGB 9.7*  HCT 30.5*  PLT 468*   Recent Labs    06/08/17 0456  NA 139  K 4.0  CL 106  GLUCOSE 126*  BUN 31*  CREATININE 1.18*  CALCIUM 8.7*   CBG (last 3)  Recent Labs    06/08/17 1640 06/08/17 2118 06/09/17 0703  GLUCAP 98 165* 123*    Wt Readings from Last 3 Encounters:  06/09/17 61.7 kg (136 lb 0.4 oz)  05/22/17 57.2 kg (126 lb)  05/14/17 58.1 kg (128 lb)    Physical Exam:  BP (!) 137/58   Pulse 70   Temp 98 F (36.7 C) (Oral)   Resp 18   Ht 5\' 1"  (1.549 m)   Wt 61.7 kg (136 lb 0.4 oz)   SpO2 98%   BMI 25.70 kg/m  Constitutional: She appears well-developed and well-nourished. No distress.  HENT: Normocephalic and atraumatic.  Eyes: EOM are normal. No discharge.  Cardiovascular: RRR. Murmur heard. No JVD. Respiratory: Effort normal. Clear.  GI: Bowel sounds are normal. She exhibits no distension.  Musculoskeletal: Edema right hip and thigh, continues to improve slowly Neurological: She is alert and oriented.  Motor: B/l UE: 4/5 proximal to distal RLE: HF, 4/5, ADF/PF 4+/5 (stable) LLE: HF, KE 4+/5, ADF/PF 4+/5  Skin: Right thigh incision C/D/I  Right heel blister.   Psychiatric: She has a normal mood and affect. Her behavior is normal.    Assessment/Plan: 1. Functional deficits secondary to right peri-prosthetic hip fracture which require 3+ hours per day of interdisciplinary therapy in a comprehensive inpatient rehab setting. Physiatrist is providing close team supervision and  24 hour management of active medical problems listed below. Physiatrist and rehab team continue to assess barriers to discharge/monitor patient progress toward functional and medical goals.  Function:  Bathing Bathing position Bathing activity did not occur: Safety/medical concerns Position: Wheelchair/chair at sink  Bathing parts Body parts bathed by patient: Right arm, Left arm, Chest, Abdomen, Front perineal area, Right upper leg, Left upper leg, Left lower leg Body parts bathed by helper: Right lower leg, Back, Buttocks  Bathing assist Assist Level: Touching or steadying assistance(Pt > 75%)      Upper Body Dressing/Undressing Upper body dressing   What is the patient wearing?: Button up shirt, Bra Bra - Perfomed by patient: Thread/unthread right bra strap, Thread/unthread left bra strap, Hook/unhook bra (pull down sports bra)       Button up shirt - Perfomed by patient: Thread/unthread right sleeve, Thread/unthread left sleeve, Pull shirt around back, Button/unbutton shirt      Upper body assist Assist Level: Set up      Lower Body Dressing/Undressing Lower body dressing Lower body dressing/undressing activity did not occur: Safety/medical concerns What is the patient wearing?: Pants   Underwear - Performed by helper: Thread/unthread right underwear leg, Thread/unthread left underwear leg, Pull underwear up/down Pants- Performed by patient: Pull pants up/down, Thread/unthread left pants leg Pants- Performed by helper: Thread/unthread right pants leg Non-skid slipper socks- Performed by  patient: Don/doff left sock Non-skid slipper socks- Performed by helper: Don/doff right sock       Shoes - Performed by helper: Don/doff left shoe(L foot only)          Lower body assist Assist for lower body dressing: (Mod assist)      Toileting Toileting   Toileting steps completed by patient: Adjust clothing prior to toileting Toileting steps completed by helper: Performs  perineal hygiene, Adjust clothing after toileting Toileting Assistive Devices: Grab bar or rail  Toileting assist Assist level: Touching or steadying assistance (Pt.75%)   Transfers Chair/bed transfer Chair/bed transfer activity did not occur: Safety/medical concerns(Hg 7.0 - medically unstable) Chair/bed transfer method: Squat pivot Chair/bed transfer assist level: Touching or steadying assistance (Pt > 75%) Chair/bed transfer assistive device: Armrests, Medical sales representative Ambulation activity did not occur: Safety/medical concerns(Hg 7.0 - medically unstable)   Max distance: 10 ft Assist level: Touching or steadying assistance (Pt > 75%)   Wheelchair Wheelchair activity did not occur: Safety/medical concerns(Hg 7.0 - medically unstable) Type: Manual Max wheelchair distance: 50 Assist Level: Moderate assistance (Pt 50 - 74%)  Cognition Comprehension Comprehension assist level: Follows complex conversation/direction with extra time/assistive device  Expression Expression assist level: Expresses complex ideas: With extra time/assistive device  Social Interaction Social Interaction assist level: Interacts appropriately with others with medication or extra time (anti-anxiety, antidepressant).  Problem Solving Problem solving assist level: Solves complex 90% of the time/cues < 10% of the time  Memory Memory assist level: More than reasonable amount of time    Medical Problem List and Plan: 1.  Deficits with mobility, transfers, and self-care secondary to right peri-prosthetic hip fracture.    Continue CIR 2.  DVT Prophylaxis/Anticoagulation: Mechanical: Sequential compression devices, below knee Bilateral lower extremities--no blood thinners due to history of hematochezia.    Vascular study negative for DVT on 1/31 3. Pain Management: oxycodone prn 4. Mood: team to provide ego support to help manage pain and anxiety 5. Neuropsych: This patient is capable of making  decisions on her own behalf. 6. Skin/Wound Care: Monitor scalp wound, anal wound and right hip incision for healing.  7. Fluids/Electrolytes/Nutrition: Monitor I/Os.  8. ABLA:    Hemoglobin 9.7 on 2/8   Cont to monitor 9. HTN: Monitor BP bid--continue Cozaar.   Toprolol increased on 2/3   Hydralazine 10 TID started on 2/5, increased to 25 on 2/9    Vitals:   06/09/17 0521 06/09/17 0927  BP: (!) 165/60 (!) 137/58  Pulse: 74 70  Resp: 18   Temp: 98 F (36.7 C)   SpO2: 98%   10. T2DM: Hgb A1C-7.0. BS    D/ced Lantus 2/5    Discussed with pharmacy, will change to Glucatrol 2.5 daily (PTA med) on 2/5    CBG (last 3)  Recent Labs    06/08/17 1640 06/08/17 2118 06/09/17 0703  GLUCAP 98 165* 123*    Relatively controlled on 2/9 11. H/o Asthma: Scheduled nebs qid, changed to PRN. 12. Right heel ulcer: Ordered prevalon boot. Foam dressing. Added protein supplement to help with wound healing.  13. Recent Anal fistulotomy: Keep area clean and dry.  14. Chronic renal failure?: Baseline SCr around 1.2?    Creatinine 1.18 on 2/8   Encourage fluids   15. Right hemiarthroplasty: She is to continue right total hip precautions as well as  NWB due to recent surgery.  16. CAD with moderate aortic stenosis/Grade 2 diastolic dysfunction: Monitor daily weights.  On lipitor, Cozaar and Toprol. Avoid overload.  Filed Weights   06/05/17 0505 06/06/17 0319 06/09/17 0521  Weight: 60.9 kg (134 lb 4.2 oz) 61 kg (134 lb 7.7 oz) 61.7 kg (136 lb 0.4 oz)  17. Hypoalbuminemia  Supplement initiated 18. Hypokalemia  K+  4.0 on 2/8 after supplementation    Cont to montor 19. Relatively freq stools per pt   No diarhhea per report and pt  Fiber added 2/4, increased on 2/7   Overall improving  LOS (Days) 10 A FACE TO FACE EVALUATION WAS PERFORMED  Tannen Vandezande Lorie Phenix 06/09/2017 11:15 AM

## 2017-06-10 ENCOUNTER — Inpatient Hospital Stay (HOSPITAL_COMMUNITY): Payer: Medicare Other

## 2017-06-10 LAB — GLUCOSE, CAPILLARY
GLUCOSE-CAPILLARY: 149 mg/dL — AB (ref 65–99)
GLUCOSE-CAPILLARY: 156 mg/dL — AB (ref 65–99)
GLUCOSE-CAPILLARY: 190 mg/dL — AB (ref 65–99)
Glucose-Capillary: 96 mg/dL (ref 65–99)

## 2017-06-10 NOTE — Progress Notes (Signed)
[place] [place] PHYSICAL MEDICINE & REHABILITATION     PROGRESS NOTE  Subjective/Complaints:  Patient seen lying in bed this morning.  She states she slept well overnight.  She states she had some pain in her knee overnight but that was relieved with medications.  ROS: Denies CP, SOB, nausea, vomiting, diarrhea.  Objective: Vital Signs: Blood pressure (!) 137/50, pulse 66, temperature 97.8 F (36.6 C), temperature source Oral, resp. rate 18, height 5\' 1"  (1.549 m), weight 60.5 kg (133 lb 6.1 oz), SpO2 98 %. No results found. Recent Labs    06/08/17 0456  WBC 8.4  HGB 9.7*  HCT 30.5*  PLT 468*   Recent Labs    06/08/17 0456  NA 139  K 4.0  CL 106  GLUCOSE 126*  BUN 31*  CREATININE 1.18*  CALCIUM 8.7*   CBG (last 3)  Recent Labs    06/09/17 2051 06/10/17 0635 06/10/17 1147  GLUCAP 94 96 149*    Wt Readings from Last 3 Encounters:  06/10/17 60.5 kg (133 lb 6.1 oz)  05/22/17 57.2 kg (126 lb)  05/14/17 58.1 kg (128 lb)    Physical Exam:  BP (!) 137/50   Pulse 66   Temp 97.8 F (36.6 C) (Oral)   Resp 18   Ht 5\' 1"  (1.549 m)   Wt 60.5 kg (133 lb 6.1 oz)   SpO2 98%   BMI 25.20 kg/m  Constitutional: She appears well-developed and well-nourished. No distress.  HENT: Normocephalic and atraumatic.  Eyes: EOM are normal. No discharge.  Cardiovascular: RRR. Murmur heard. No JVD. Respiratory: Effort normal.  Clear.  GI: Bowel sounds are normal. She exhibits no distension.  Musculoskeletal: Edema right hip and thigh, improving  Neurological: She is alert and oriented.  Motor: B/l UE: 4/5 proximal to distal RLE: HF, 4/5, ADF/PF 4+/5 (unchanged) LLE: HF, KE 4+/5, ADF/PF 4+/5  Skin: Right thigh incision C/D/I  Right heel blister, less fluctuant.   Psychiatric: She has a normal mood and affect. Her behavior is normal.    Assessment/Plan: 1. Functional deficits secondary to right peri-prosthetic hip fracture which require 3+ hours per day of interdisciplinary therapy  in a comprehensive inpatient rehab setting. Physiatrist is providing close team supervision and 24 hour management of active medical problems listed below. Physiatrist and rehab team continue to assess barriers to discharge/monitor patient progress toward functional and medical goals.  Function:  Bathing Bathing position Bathing activity did not occur: Safety/medical concerns Position: Wheelchair/chair at sink  Bathing parts Body parts bathed by patient: Right arm, Left arm, Chest, Abdomen, Front perineal area, Right upper leg, Left upper leg, Left lower leg Body parts bathed by helper: Right lower leg, Back, Buttocks  Bathing assist Assist Level: Touching or steadying assistance(Pt > 75%)      Upper Body Dressing/Undressing Upper body dressing   What is the patient wearing?: Button up shirt, Bra Bra - Perfomed by patient: Thread/unthread right bra strap, Thread/unthread left bra strap, Hook/unhook bra (pull down sports bra)       Button up shirt - Perfomed by patient: Thread/unthread right sleeve, Thread/unthread left sleeve, Pull shirt around back, Button/unbutton shirt      Upper body assist Assist Level: Set up      Lower Body Dressing/Undressing Lower body dressing Lower body dressing/undressing activity did not occur: Safety/medical concerns What is the patient wearing?: Pants   Underwear - Performed by helper: Thread/unthread right underwear leg, Thread/unthread left underwear leg, Pull underwear up/down Pants- Performed by patient: Pull  pants up/down, Thread/unthread left pants leg Pants- Performed by helper: Thread/unthread right pants leg Non-skid slipper socks- Performed by patient: Don/doff left sock Non-skid slipper socks- Performed by helper: Don/doff right sock       Shoes - Performed by helper: Don/doff left shoe(L foot only)          Lower body assist Assist for lower body dressing: (Mod assist)      Toileting Toileting   Toileting steps completed by  patient: Performs perineal hygiene Toileting steps completed by helper: Adjust clothing prior to toileting, Adjust clothing after toileting Toileting Assistive Devices: Grab bar or rail  Toileting assist Assist level: Touching or steadying assistance (Pt.75%)   Transfers Chair/bed transfer Chair/bed transfer activity did not occur: Safety/medical concerns(Hg 7.0 - medically unstable) Chair/bed transfer method: Stand pivot Chair/bed transfer assist level: Touching or steadying assistance (Pt > 75%) Chair/bed transfer assistive device: Armrests, Medical sales representative Ambulation activity did not occur: Safety/medical concerns(Hg 7.0 - medically unstable)   Max distance: 10 ft Assist level: Touching or steadying assistance (Pt > 75%)   Wheelchair Wheelchair activity did not occur: Safety/medical concerns(Hg 7.0 - medically unstable) Type: Manual Max wheelchair distance: 44' Assist Level: Supervision or verbal cues  Cognition Comprehension Comprehension assist level: Follows complex conversation/direction with extra time/assistive device  Expression Expression assist level: Expresses complex ideas: With extra time/assistive device  Social Interaction Social Interaction assist level: Interacts appropriately with others with medication or extra time (anti-anxiety, antidepressant).  Problem Solving Problem solving assist level: Solves complex 90% of the time/cues < 10% of the time  Memory Memory assist level: More than reasonable amount of time    Medical Problem List and Plan: 1.  Deficits with mobility, transfers, and self-care secondary to right peri-prosthetic hip fracture.    Continue CIR 2.  DVT Prophylaxis/Anticoagulation: Mechanical: Sequential compression devices, below knee Bilateral lower extremities--no blood thinners due to history of hematochezia.    Vascular study negative for DVT on 1/31 3. Pain Management: oxycodone prn 4. Mood: team to provide ego support to  help manage pain and anxiety 5. Neuropsych: This patient is capable of making decisions on her own behalf. 6. Skin/Wound Care: Monitor scalp wound, anal wound and right hip incision for healing.  7. Fluids/Electrolytes/Nutrition: Monitor I/Os.  8. ABLA:    Hemoglobin 9.7 on 2/8   Cont to monitor 9. HTN: Monitor BP bid--continue Cozaar.   Toprolol increased on 2/3   Hydralazine 10 TID started on 2/5, increased to 25 on 2/9   Improving overall on 2/10   Vitals:   06/10/17 0550 06/10/17 0552  BP: (!) 137/50 (!) 137/50  Pulse: 66   Resp: 18   Temp: 97.8 F (36.6 C)   SpO2: 98%   10. T2DM: Hgb A1C-7.0. BS    D/ced Lantus 2/5    Discussed with pharmacy, will change to Glucatrol 2.5 daily (PTA med) on 2/5    CBG (last 3)  Recent Labs    06/09/17 2051 06/10/17 0635 06/10/17 1147  GLUCAP 94 96 149*    Relatively controlled on 2/10 11. H/o Asthma: Scheduled nebs qid, changed to PRN. 12. Right heel ulcer: Ordered prevalon boot. Foam dressing. Added protein supplement to help with wound healing.  13. Recent Anal fistulotomy: Keep area clean and dry.  14. Chronic renal failure?: Baseline SCr around 1.2?    Creatinine 1.18 on 2/8   Encourage fluids   15. Right hemiarthroplasty: She is to continue right total hip precautions  as well as  NWB due to recent surgery.  16. CAD with moderate aortic stenosis/Grade 2 diastolic dysfunction: Monitor daily weights. On lipitor, Cozaar and Toprol. Avoid overload.  Filed Weights   06/06/17 0319 06/09/17 0521 06/10/17 0550  Weight: 61 kg (134 lb 7.7 oz) 61.7 kg (136 lb 0.4 oz) 60.5 kg (133 lb 6.1 oz)  17. Hypoalbuminemia  Supplement initiated 18. Hypokalemia  K+  4.0 on 2/8 after supplementation    Cont to montor 19. Relatively freq stools per pt   No diarhhea per report and pt  Fiber added 2/4, increased on 2/7   Overall improving  LOS (Days) 11 A FACE TO FACE EVALUATION WAS PERFORMED  Glenora Morocho Lorie Phenix 06/10/2017 2:07 PM

## 2017-06-10 NOTE — Progress Notes (Signed)
Physical Therapy Session Note  Patient Details  Name: Maureen Morales MRN: [id number] Date of Birth: 21-Mar-1923  Today's Date: 06/10/2017 PT Individual Time: 2641-5830 PT Individual Time Calculation (min): 64 min   Short Term Goals: Week 2:  PT Short Term Goal 1 (Week 2): = LTG  Skilled Therapeutic Interventions/Progress Updates:    Session focused on w/c mobility training (supervision to occasional min assist initially for turning and obstacle avoidance but able to progress to need for verbal cueing only), functional transfer training with RW for stand pivot with focus on technique and safety (overall min assist needed) several times throughout session to various surfaces including recliner, sit <> stands and dynamic standing balance while performing various functional reaching activities with 1 UE support (varied between R and L support) outside BOS with steadying assist for balance and 1 episode of LOB requiring mod assist due to LOB to the R), and BLE therex for functional strengthening and ROM. Pt required supine rest break due to position of comfort for RLE and able to perform bed mobility on flat mat table with supervision including scooting up in supine with modified bridge for WB through LLE only. Discussed home environment mobility and pt reporting mainly using w/c and her aides likely will perform transport.   Therex including: BLE LAQ with 5 second hold (LLE with 2.5 # ankle weight) x 15 reps each BLE ankle pumps x 15 reps each RLE standing hip abduction, hamstring curls and hip flexion within precautions range x 15 reps each with RW for support LLE seated hip marches with 2.5 # ankle weight x 15 reps  Therapy Documentation Precautions:  Precautions Precautions: Fall, Posterior Hip Precaution Comments: pt recalls 2/3 posterior hip precautions; sign posted in room Restrictions Weight Bearing Restrictions: Yes RLE Weight Bearing: Non weight bearing   Pain: Reports pain and  spasms in RLE. Declines medication due to groggy feeling as a side effect. Applied ice pack at end of session.  See Function Navigator for Current Functional Status.   Therapy/Group: Individual Therapy  Canary Brim Ivory Broad, PT, DPT  06/10/2017, 11:48 AM

## 2017-06-11 ENCOUNTER — Inpatient Hospital Stay (HOSPITAL_COMMUNITY): Payer: Medicare Other | Admitting: Occupational Therapy

## 2017-06-11 ENCOUNTER — Inpatient Hospital Stay (HOSPITAL_COMMUNITY): Payer: Medicare Other

## 2017-06-11 LAB — GLUCOSE, CAPILLARY
GLUCOSE-CAPILLARY: 117 mg/dL — AB (ref 65–99)
Glucose-Capillary: 131 mg/dL — ABNORMAL HIGH (ref 65–99)
Glucose-Capillary: 115 mg/dL — ABNORMAL HIGH (ref 65–99)
Glucose-Capillary: 150 mg/dL — ABNORMAL HIGH (ref 65–99)

## 2017-06-11 MED ORDER — CALCIUM POLYCARBOPHIL 625 MG PO TABS
1250.0000 mg | ORAL_TABLET | Freq: Two times a day (BID) | ORAL | Status: DC
  Administered 2017-06-11 – 2017-06-13 (×4): 1250 mg via ORAL
  Filled 2017-06-11 (×4): qty 2

## 2017-06-11 NOTE — Progress Notes (Signed)
Occupational Therapy Session Note  Patient Details  Name: Maureen Morales MRN: [id number] Date of Birth: 1923-04-29  Today's Date: 06/11/2017 OT Individual Time: 1660-6301 and 6010-9323 OT Individual Time Calculation (min): 60 min and 65 min   Short Term Goals: Week 2:  OT Short Term Goal 1 (Week 2): STG=LTG 2/2 ELOS  Skilled Therapeutic Interventions/Progress Updates:    1) Treatment session with focus on adherence to hip precautions during self-care tasks and transfers.  Pt requesting to engage in bathing/dressing at sink side.  Therapist providing min cueing for hip precautions during LB bathing/dressing and when reaching for items on sink side - engaged in further discussion and demonstration with hip precautions.  Pt reports need to toilet.  Completed stand pivot transfer w/c <> BSC over toilet with min assist for stability/safety, pt maintaining NWB through RLE with all standing and transfers.  Min cues for sequencing and problem solving with reaching when donning pants.  Pt unable and declined attempting to don/doff Rt sock due to "blister" on heel and bandage.  Transferred back to recliner with min assist for safety and left upright with chair alarm on, quick release belt intact, and lunch tray set up.  2) Treatment session with focus on d/c planning with discussion regarding pt progress towards goals.  Discussed caregiver schedule and plan for min assist with transfers.  Completed furniture transfers in ADL apt with focus on NWB during transfers to various height surfaces.  Discussed transfer in/out of walk-in shower with pt describing setup of personal shower.  Completed stand pivot transfer in/out of simulated walk-in shower box with use of tub bench.  Pt reports that her caregivers will be able to physically assist her, but reassured by current status.  Returned to room and completed toilet transfer with pt able to complete clothing management with increased time while therapist providing  tactile assist for standing balance.  Returned to recliner and left upright with all needs in reach and chair alarm and quick release belt intact.  Therapy Documentation Precautions:  Precautions Precautions: Fall, Posterior Hip Precaution Comments: pt recalls 2/3 posterior hip precautions; sign posted in room Restrictions Weight Bearing Restrictions: Yes RLE Weight Bearing: Non weight bearing Pain:  Pt with intermittent c/o pain in Rt hip, positional.  Pain subsided with repositioning.  See Function Navigator for Current Functional Status.   Therapy/Group: Individual Therapy  [name] 06/11/2017, 12:19 PM

## 2017-06-11 NOTE — Progress Notes (Signed)
[place] PHYSICAL MEDICINE & REHABILITATION     PROGRESS NOTE  Subjective/Complaints:  Patient seen lying in bed this morning.  She is due to stop well overnight.  She states she feels better this morning.  She has questions about bathing when she is discharged home.  ROS: Denies CP, SOB, nausea, vomiting, diarrhea.  Objective: Vital Signs: Blood pressure (!) 152/52, pulse 72, temperature 98 F (36.7 C), temperature source Oral, resp. rate 18, height 5\' 1"  (1.549 m), weight 60.3 kg (132 lb 15 oz), SpO2 99 %. No results found. No results for input(s): WBC, HGB, HCT, PLT in the last 72 hours. No results for input(s): NA, K, CL, GLUCOSE, BUN, CREATININE, CALCIUM in the last 72 hours.  Invalid input(s): CO CBG (last 3)  Recent Labs    06/10/17 1626 06/10/17 2112 06/11/17 0643  GLUCAP 156* 190* 117*    Wt Readings from Last 3 Encounters:  06/11/17 60.3 kg (132 lb 15 oz)  05/22/17 57.2 kg (126 lb)  05/14/17 58.1 kg (128 lb)    Physical Exam:  BP (!) 152/52 (BP Location: Left Arm)   Pulse 72   Temp 98 F (36.7 C) (Oral)   Resp 18   Ht 5\' 1"  (1.549 m)   Wt 60.3 kg (132 lb 15 oz)   SpO2 99%   BMI 25.12 kg/m  Constitutional: She appears well-developed and well-nourished. No distress.  HENT: Normocephalic and atraumatic.  Eyes: EOM are normal. No discharge.  Cardiovascular: RRR. Murmur heard. No JVD. Respiratory: Effort normal.  Clear.  GI: Bowel sounds are normal. She exhibits no distension.  Musculoskeletal: Edema right hip and thigh, improving  Neurological: She is alert and oriented.  Motor: B/l UE: 4/5 proximal to distal RLE: HF, 4/5, ADF/PF 4+/5 (stable) LLE: HF, KE 4+/5, ADF/PF 4+/5  Skin: Right thigh incision C/D/I  Right heel blister, less fluctuant.   Psychiatric: She has a normal mood and affect. Her behavior is normal.    Assessment/Plan: 1. Functional deficits secondary to right peri-prosthetic hip fracture which require 3+ hours per day of  interdisciplinary therapy in a comprehensive inpatient rehab setting. Physiatrist is providing close team supervision and 24 hour management of active medical problems listed below. Physiatrist and rehab team continue to assess barriers to discharge/monitor patient progress toward functional and medical goals.  Function:  Bathing Bathing position Bathing activity did not occur: Safety/medical concerns Position: Wheelchair/chair at sink  Bathing parts Body parts bathed by patient: Right arm, Left arm, Chest, Abdomen, Front perineal area, Right upper leg, Left upper leg, Left lower leg Body parts bathed by helper: Right lower leg, Back, Buttocks  Bathing assist Assist Level: Touching or steadying assistance(Pt > 75%)      Upper Body Dressing/Undressing Upper body dressing   What is the patient wearing?: Button up shirt, Bra Bra - Perfomed by patient: Thread/unthread right bra strap, Thread/unthread left bra strap, Hook/unhook bra (pull down sports bra)       Button up shirt - Perfomed by patient: Thread/unthread right sleeve, Thread/unthread left sleeve, Pull shirt around back, Button/unbutton shirt      Upper body assist Assist Level: Set up      Lower Body Dressing/Undressing Lower body dressing Lower body dressing/undressing activity did not occur: Safety/medical concerns What is the patient wearing?: Pants   Underwear - Performed by helper: Thread/unthread right underwear leg, Thread/unthread left underwear leg, Pull underwear up/down Pants- Performed by patient: Pull pants up/down, Thread/unthread left pants leg Pants- Performed by helper: Thread/unthread  right pants leg Non-skid slipper socks- Performed by patient: Don/doff left sock Non-skid slipper socks- Performed by helper: Don/doff right sock       Shoes - Performed by helper: Don/doff left shoe(L foot only)          Lower body assist Assist for lower body dressing: (Mod assist)      Toileting Toileting    Toileting steps completed by patient: Performs perineal hygiene Toileting steps completed by helper: Adjust clothing prior to toileting, Adjust clothing after toileting Toileting Assistive Devices: Grab bar or rail  Toileting assist Assist level: Touching or steadying assistance (Pt.75%)   Transfers Chair/bed transfer Chair/bed transfer activity did not occur: Safety/medical concerns(Hg 7.0 - medically unstable) Chair/bed transfer method: Stand pivot Chair/bed transfer assist level: Touching or steadying assistance (Pt > 75%) Chair/bed transfer assistive device: Armrests, Medical sales representative Ambulation activity did not occur: Safety/medical concerns(Hg 7.0 - medically unstable)   Max distance: 10 ft Assist level: Touching or steadying assistance (Pt > 75%)   Wheelchair Wheelchair activity did not occur: Safety/medical concerns(Hg 7.0 - medically unstable) Type: Manual Max wheelchair distance: 6' Assist Level: Supervision or verbal cues  Cognition Comprehension Comprehension assist level: Follows complex conversation/direction with extra time/assistive device  Expression Expression assist level: Expresses complex ideas: With extra time/assistive device  Social Interaction Social Interaction assist level: Interacts appropriately with others with medication or extra time (anti-anxiety, antidepressant).  Problem Solving Problem solving assist level: Solves complex 90% of the time/cues < 10% of the time  Memory Memory assist level: More than reasonable amount of time    Medical Problem List and Plan: 1.  Deficits with mobility, transfers, and self-care secondary to right peri-prosthetic hip fracture.    Continue CIR 2.  DVT Prophylaxis/Anticoagulation: Mechanical: Sequential compression devices, below knee Bilateral lower extremities--no blood thinners due to history of hematochezia.    Vascular study negative for DVT on 1/31 3. Pain Management: oxycodone prn 4. Mood:  team to provide ego support to help manage pain and anxiety 5. Neuropsych: This patient is capable of making decisions on her own behalf. 6. Skin/Wound Care: Monitor scalp wound, anal wound and right hip incision for healing.  7. Fluids/Electrolytes/Nutrition: Monitor I/Os.  8. ABLA:    Hemoglobin 9.7 on 2/8   Cont to monitor 9. HTN: Monitor BP bid--continue Cozaar.   Toprolol increased on 2/3   Hydralazine 10 TID started on 2/5, increased to 25 on 2/9   Appears to be gradually improving overall on 2/11  Vitals:   06/10/17 2117 06/11/17 0554  BP: (!) 141/52 (!) 152/52  Pulse: 71 72  Resp:  18  Temp:  98 F (36.7 C)  SpO2: 97% 99%  10. T2DM: Hgb A1C-7.0. BS    D/ced Lantus 2/5    Discussed with pharmacy, will change to Glucatrol 2.5 daily (PTA med) on 2/5    CBG (last 3)  Recent Labs    06/10/17 1626 06/10/17 2112 06/11/17 0643  GLUCAP 156* 190* 117*    Relatively controlled on 2/11 11. H/o Asthma: Scheduled nebs qid, changed to PRN. 12. Right heel ulcer: Ordered prevalon boot. Foam dressing. Added protein supplement to help with wound healing.  13. Recent Anal fistulotomy: Keep area clean and dry.  14. Chronic renal failure?: Baseline SCr around 1.2?    Creatinine 1.18 on 2/8   Encourage fluids   15. Right hemiarthroplasty: She is to continue right total hip precautions as well as  NWB due to recent  surgery.  16. CAD with moderate aortic stenosis/Grade 2 diastolic dysfunction: Monitor daily weights. On lipitor, Cozaar and Toprol. Avoid overload.  Filed Weights   06/09/17 0521 06/10/17 0550 06/11/17 0554  Weight: 61.7 kg (136 lb 0.4 oz) 60.5 kg (133 lb 6.1 oz) 60.3 kg (132 lb 15 oz)  17. Hypoalbuminemia  Supplement initiated 18. Hypokalemia  K+  4.0 on 2/8 after supplementation    Cont to montor 19. Relatively freq stools per pt   No diarhhea per report and pt  Fiber added 2/4, increased on 2/7   Overall improving  LOS (Days) 12 A FACE TO FACE EVALUATION WAS  PERFORMED  Maureen Morales Lorie Phenix 06/11/2017 8:55 AM

## 2017-06-11 NOTE — Progress Notes (Signed)
Physical Therapy Note  Patient Details  Name: Maureen Morales MRN: [id number] Date of Birth: 11/28/1922 Today's Date: 06/11/2017  0800-0900, 60 min individual tx Pain: none per pt  NWBing RLE  Pt was under the impression that she only had 1 therapy today; she stated that someone told her this yesterday.  PT discussed her schedule for today with pt.  Supine exs: 10 x 1 active assistive L unilteral bridging, 10 x 1 L straight leg raises, R heel slides avoiding shear on heel, R hip abduction, 25 x 1 alternating ankle pumps bil, 10 x 2 modified abdominal crunches.  Pt reported that she will be sitting up on the R side of bed at home.  She required cues for technique to sit up on R side of flat bed, without using rails.  Stand pivot with RW to L to w/c.  Pt ate part of her breakfast sitting in w/c.  Wc propulsion on level tile with cues for turning.  Sit> stand to RW with min assist.  Gait x 10' including turn to sit, and pivoting L foot to move backwards, closet to chair.  Min assist for eccentric control to sit.  PT demo'd and discussed at length trunk/femur movements that can result in breaking her hip precautions; pt expressed understanding..  Pt left resting in w/c with quick release belt applied, chair alarm set and all needs within reach.  See function navigator for current status.  Maureen Morales 06/11/2017, 7:48 AM

## 2017-06-12 ENCOUNTER — Inpatient Hospital Stay (HOSPITAL_COMMUNITY): Payer: Medicare Other | Admitting: Physical Therapy

## 2017-06-12 ENCOUNTER — Inpatient Hospital Stay (HOSPITAL_COMMUNITY): Payer: Medicare Other

## 2017-06-12 ENCOUNTER — Inpatient Hospital Stay (HOSPITAL_COMMUNITY): Payer: Medicare Other | Admitting: Occupational Therapy

## 2017-06-12 DIAGNOSIS — R0989 Other specified symptoms and signs involving the circulatory and respiratory systems: Secondary | ICD-10-CM

## 2017-06-12 DIAGNOSIS — I169 Hypertensive crisis, unspecified: Secondary | ICD-10-CM | POA: Insufficient documentation

## 2017-06-12 LAB — GLUCOSE, CAPILLARY
GLUCOSE-CAPILLARY: 117 mg/dL — AB (ref 65–99)
GLUCOSE-CAPILLARY: 98 mg/dL (ref 65–99)
GLUCOSE-CAPILLARY: 212 mg/dL — AB (ref 65–99)
Glucose-Capillary: 116 mg/dL — ABNORMAL HIGH (ref 65–99)

## 2017-06-12 NOTE — Progress Notes (Signed)
[facility] PHYSICAL MEDICINE & REHABILITATION     PROGRESS NOTE  Subjective/Complaints:  Pt seen sitting at the EOB this AM.  She slept well overnight.  She is looking forward to d/c tomorrow, but wants assurance that she is improving.   ROS: Denies CP, SOB, nausea, vomiting, diarrhea.  Objective: Vital Signs: Blood pressure (!) 161/53, pulse 77, temperature 98 F (36.7 C), temperature source Oral, resp. rate 16, height 5\' 1"  (1.549 m), weight 61.3 kg (135 lb 2.3 oz), SpO2 97 %. No results found. No results for input(s): WBC, HGB, HCT, PLT in the last 72 hours. No results for input(s): NA, K, CL, GLUCOSE, BUN, CREATININE, CALCIUM in the last 72 hours.  Invalid input(s): CO CBG (last 3)  Recent Labs    06/11/17 1634 06/11/17 2043 06/12/17 0645  GLUCAP 115* 150* 117*    Wt Readings from Last 3 Encounters:  06/12/17 61.3 kg (135 lb 2.3 oz)  05/22/17 57.2 kg (126 lb)  05/14/17 58.1 kg (128 lb)    Physical Exam:  BP (!) 161/53 (BP Location: Right Arm)   Pulse 77   Temp 98 F (36.7 C) (Oral)   Resp 16   Ht 5\' 1"  (1.549 m)   Wt 61.3 kg (135 lb 2.3 oz)   SpO2 97%   BMI 25.53 kg/m  Constitutional: She appears well-developed and well-nourished. No distress.  HENT: Normocephalic and atraumatic.  Eyes: EOM are normal. No discharge.  Cardiovascular: RRR. Murmur heard. No JVD. Respiratory: Effort normal.  Clear.  GI: Bowel sounds are normal. She exhibits no distension.  Musculoskeletal: Edema right hip and thigh, improving Neurological: She is alert and oriented.  Motor: B/l UE: 4/5 proximal to distal RLE: HF, 4/5, ADF/PF 4+/5 (slowly improving) LLE: HF, KE 4+/5, ADF/PF 4+/5  Skin: Right thigh incision C/D/I  Right heel blister, less fluctuant.   Psychiatric: She has a normal mood and affect. Her behavior is normal.    Assessment/Plan: 1. Functional deficits secondary to right peri-prosthetic hip fracture which require 3+ hours per day of interdisciplinary therapy in a  comprehensive inpatient rehab setting. Physiatrist is providing close team supervision and 24 hour management of active medical problems listed below. Physiatrist and rehab team continue to assess barriers to discharge/monitor patient progress toward functional and medical goals.  Function:  Bathing Bathing position Bathing activity did not occur: Safety/medical concerns Position: Wheelchair/chair at sink  Bathing parts Body parts bathed by patient: Right arm, Left arm, Chest, Abdomen, Front perineal area, Right upper leg, Left upper leg, Left lower leg, Buttocks, Right lower leg Body parts bathed by helper: Back  Bathing assist Assist Level: Assistive device, Touching or steadying assistance(Pt > 75%) Assistive Device Comment: long handled sponge    Upper Body Dressing/Undressing Upper body dressing   What is the patient wearing?: Button up shirt, Bra Bra - Perfomed by patient: Thread/unthread right bra strap, Thread/unthread left bra strap, Hook/unhook bra (pull down sports bra)       Button up shirt - Perfomed by patient: Thread/unthread right sleeve, Thread/unthread left sleeve, Pull shirt around back, Button/unbutton shirt      Upper body assist Assist Level: Set up      Lower Body Dressing/Undressing Lower body dressing Lower body dressing/undressing activity did not occur: Safety/medical concerns What is the patient wearing?: Pants   Underwear - Performed by helper: Thread/unthread right underwear leg, Thread/unthread left underwear leg, Pull underwear up/down Pants- Performed by patient: Pull pants up/down, Thread/unthread left pants leg Pants- Performed by helper:  Thread/unthread right pants leg Non-skid slipper socks- Performed by patient: Don/doff left sock Non-skid slipper socks- Performed by helper: Don/doff right sock       Shoes - Performed by helper: Don/doff left shoe(L foot only)          Lower body assist Assist for lower body dressing: (Mod assist)       Toileting Toileting   Toileting steps completed by patient: Adjust clothing prior to toileting, Adjust clothing after toileting Toileting steps completed by helper: Performs perineal hygiene Toileting Assistive Devices: Grab bar or rail  Toileting assist Assist level: Touching or steadying assistance (Pt.75%)   Transfers Chair/bed transfer Chair/bed transfer activity did not occur: Safety/medical concerns(Hg 7.0 - medically unstable) Chair/bed transfer method: Stand pivot Chair/bed transfer assist level: Moderate assist (Pt 50 - 74%/lift or lower) Chair/bed transfer assistive device: Medical sales representative Ambulation activity did not occur: Safety/medical concerns(Hg 7.0 - medically unstable)   Max distance: 10 Assist level: Touching or steadying assistance (Pt > 75%)   Wheelchair Wheelchair activity did not occur: Safety/medical concerns(Hg 7.0 - medically unstable) Type: Manual Max wheelchair distance: 75 Assist Level: Supervision or verbal cues  Cognition Comprehension Comprehension assist level: Follows complex conversation/direction with extra time/assistive device  Expression Expression assist level: Expresses complex ideas: With extra time/assistive device  Social Interaction Social Interaction assist level: Interacts appropriately with others with medication or extra time (anti-anxiety, antidepressant).  Problem Solving Problem solving assist level: Solves complex 90% of the time/cues < 10% of the time  Memory Memory assist level: More than reasonable amount of time    Medical Problem List and Plan: 1.  Deficits with mobility, transfers, and self-care secondary to right peri-prosthetic hip fracture.    Continue CIR, plan for d/c tomorrow 2.  DVT Prophylaxis/Anticoagulation: Mechanical: Sequential compression devices, below knee Bilateral lower extremities--no blood thinners due to history of hematochezia.    Vascular study negative for DVT on 1/31 3. Pain  Management: oxycodone prn 4. Mood: team to provide ego support to help manage pain and anxiety 5. Neuropsych: This patient is capable of making decisions on her own behalf. 6. Skin/Wound Care: Monitor scalp wound, anal wound and right hip incision for healing.  7. Fluids/Electrolytes/Nutrition: Monitor I/Os.  8. ABLA:    Hemoglobin 9.7 on 2/8   Cont to monitor 9. HTN: Monitor BP bid--continue Cozaar.   Toprolol increased on 2/3   Hydralazine 10 TID started on 2/5, increased to 25 on 2/9   Extremely labile, with hypertensive crisis overnight.  Had been fairly well controlled.  Will monitor and consider further med adjustments if necessary.  Vitals:   06/11/17 2057 06/12/17 0204  BP: (!) 187/55 (!) 161/53  Pulse: 71 77  Resp:  16  Temp:  98 F (36.7 C)  SpO2: 99% 97%  10. T2DM: Hgb A1C-7.0. BS    D/ced Lantus 2/5    Discussed with pharmacy, will change to Glucatrol 2.5 daily (PTA med) on 2/5    CBG (last 3)  Recent Labs    06/11/17 1634 06/11/17 2043 06/12/17 0645  GLUCAP 115* 150* 117*    Relatively controlled on 2/12 11. H/o Asthma: Scheduled nebs qid, changed to PRN. 12. Right heel ulcer: Ordered prevalon boot. Foam dressing. Added protein supplement to help with wound healing.  13. Recent Anal fistulotomy: Keep area clean and dry.  14. Chronic renal failure?: Baseline SCr around 1.2?    Creatinine 1.18 on 2/8   Encourage fluids   15.  Right hemiarthroplasty: She is to continue right total hip precautions as well as  NWB due to recent surgery.  16. CAD with moderate aortic stenosis/Grade 2 diastolic dysfunction: Monitor daily weights. On lipitor, Cozaar and Toprol. Avoid overload.  Filed Weights   06/10/17 0550 06/11/17 0554 06/12/17 0500  Weight: 60.5 kg (133 lb 6.1 oz) 60.3 kg (132 lb 15 oz) 61.3 kg (135 lb 2.3 oz)  17. Hypoalbuminemia  Supplement initiated 18. Hypokalemia  K+  4.0 on 2/8 after supplementation    Cont to montor 19. Relatively freq stools per pt   No  diarhhea per report and pt  Fiber added 2/4, increased on 2/7   Overall improving  LOS (Days) 13 A FACE TO FACE EVALUATION WAS PERFORMED  Ankit Lorie Phenix 06/12/2017 8:11 AM

## 2017-06-12 NOTE — Progress Notes (Signed)
Occupational Therapy Session Note  Patient Details  Name: Maureen Morales MRN: [id number] Date of Birth: 1922-12-31  Today's Date: 06/12/2017 OT Individual Time: 8938-1017 and 1400-1500 OT Individual Time Calculation (min): 60 min and 60 min   Short Term Goals: Week 2:  OT Short Term Goal 1 (Week 2): STG=LTG 2/2 ELOS  Skilled Therapeutic Interventions/Progress Updates:    1) Treatment session with focus on dynamic standing balance and transfers as needed to complete self-care tasks.  Pt received in bed, completed bed mobility with supervision with increased time.  Completed stand pivot transfer bed > w/c > toilet with min assist.  Pt demonstrating improved sit <> stand with arm rests on BSC and w/c as compared without (from EOB).  Pt completed hygiene while maintaining NWB and hip precautions, with min assist for standing balance.  Completed toileting x2 with pt voiding bladder followed by bowel later in session.  Engaged in bathing and dressing with use of AE to maintain hip precautions.  Setup to increase success with donning pants with reaching with pt demonstrating increased success.  Pt declined doffing/donning Rt sock due to bandage on heel.  Pt left at sink to complete grooming tasks, nurse tech notified of position.  2) Engaged in family education with pt's daughter [name] and caregiver [name] with focus on transfers with and without RW.  Pt completed stand pivot transfer w/c <> bed with min assist and bed mobility with supervision.  Engaged in education regarding NWB status and hip precautions with mobility and LB dressing.  Engaged in toilet transfers with setup similar to home setup with therapist demonstrating min assist level with pt, followed by hands on assist from caregiver and daughter with both verbalizing and demonstrating safe technique.  Discussed recommendation for transfers in bathroom from w/c without RW due to tight quarters with each verbalizing understanding.  Engaged in car  transfer with pt return demonstrating transfer with caregiver with no additional cues from therapist.  Engaged in discussion regarding follow up recommendations with pt, family, and caregiver reporting no further questions.  Therapy Documentation Precautions:  Precautions Precautions: Fall, Posterior Hip Precaution Comments: pt recalls 2/3 posterior hip precautions; sign posted in room Restrictions Weight Bearing Restrictions: Yes RLE Weight Bearing: Non weight bearing General:   Vital Signs: Therapy Vitals BP: (!) 151/54 Pain:  Pt with c/o headache.  RN notified.  See Function Navigator for Current Functional Status.   Therapy/Group: Individual Therapy  [name] 06/12/2017, 9:53 AM

## 2017-06-12 NOTE — Progress Notes (Signed)
Recreational Therapy Discharge Summary Patient Details  Name: Maureen Morales MRN: [id number] Date of Birth: 1922-10-17 Today's Date: 06/12/2017  Comments on progress toward goals: Pt is set for discharge home tomorrow at Mod I for simple TR tasks seated.  Pt willl be discharging home with family and caregivers to provide supervision/assistance.  TR sessions focused on leisure participation, activity analysis with potential modifications, and discharge planning.  Reasons for discharge: discharge from hospital Patient/family agrees with progress made and goals achieved: Yes  [name] 06/12/2017, 12:09 PM

## 2017-06-12 NOTE — Progress Notes (Signed)
Recreational Therapy Session Note  Patient Details  Name: ARRYN TERRONES MRN: [id number] Date of Birth: [birth year] Today's Date: 06/12/2017  Pain: no c/o Skilled Therapeutic Interventions/Progress Updates: Pt participated in tabletop card game seated with Mod I.  During game play, discussed discharge planning.  Pt states she has no concerns about discharge as she will have plenty of help.  She is anxious to return home with family and return to previously enjoyed activities.  [place] 06/12/2017, 12:04 PM

## 2017-06-12 NOTE — Progress Notes (Signed)
Occupational Therapy Discharge Summary  Patient Details  Name: Maureen Morales MRN: [id number] Date of Birth: January 21, 1923  Patient has met 8 of 8 long term goals due to improved activity tolerance, improved balance and ability to compensate for deficits.  Patient to discharge at [facility] assist level for transfers, LB bathing/dressing and toileting and Supervision for UB bathing/dressing.  Patient's care partner is independent to provide the necessary physical assistance at discharge.    Reasons goals not met: NA  Recommendation:  Patient will benefit from ongoing skilled OT services in home health setting to continue to advance functional skills in the area of BADL and Reduce care partner burden.  Equipment: No equipment provided  Reasons for discharge: treatment goals met and discharge from hospital  Patient/family agrees with progress made and goals achieved: Yes  OT Discharge Precautions/Restrictions  Precautions Precautions: Fall;Posterior Hip Restrictions Weight Bearing Restrictions: Yes RLE Weight Bearing: Non weight bearing General   Vital Signs Therapy Vitals Temp: 97.9 F (36.6 C) Temp Source: Oral Pulse Rate: 71 Resp: 16 BP: 136/74 Patient Position (if appropriate): Sitting Oxygen Therapy SpO2: 100 % O2 Device: Not Delivered Pain Pain Assessment Pain Assessment: No/denies pain ADL  See Function Navigator Vision Baseline Vision/History: Wears glasses Wears Glasses: At all times Patient Visual Report: No change from baseline Cognition Overall Cognitive Status: Within Functional Limits for tasks assessed Arousal/Alertness: Awake/alert Orientation Level: Oriented X4 Memory: Appears intact Safety/Judgment: Appears intact Sensation Sensation Light Touch: Appears Intact Proprioception: Appears Intact Coordination Gross Motor Movements are Fluid and Coordinated: Yes Fine Motor Movements are Fluid and Coordinated: Yes Extremity/Trunk Assessment RUE  Assessment RUE Assessment: Within Functional Limits LUE Assessment LUE Assessment: Within Functional Limits   See Function Navigator for Current Functional Status.  [name] 06/12/2017, 3:55 PM

## 2017-06-12 NOTE — Discharge Summary (Signed)
Physician Discharge Summary  Patient ID: Maureen Morales MRN: [id number] DOB/AGE: 10/14/1922 84 y.o.  Admit date: 05/30/2017 Discharge date: 06/13/2017  Discharge Diagnoses:  Principal Problem:   Periprosthetic fracture around internal prosthetic right hip joint ([place]) Active Problems:   Intermittent asthma without complication   Stage 3 chronic kidney disease (HCC)   Chronic diastolic congestive heart failure (HCC)   Hypoalbuminemia due to protein-calorie malnutrition (HCC)   Hypokalemia   Labile blood pressure   Discharged Condition: stable   Significant Diagnostic Studies: N/A   Labs:  Basic Metabolic Panel: BMP Latest Ref Rng & Units 06/08/2017 06/04/2017 05/31/2017  Glucose 65 - 99 mg/dL 126(H) 143(H) 270(H)  BUN 6 - 20 mg/dL 31(H) 33(H) 27(H)  Creatinine 0.44 - 1.00 mg/dL 1.18(H) 0.97 1.18(H)  BUN/Creat Ratio 6 - 22 (calc) - - -  Sodium 135 - 145 mmol/L 139 137 138  Potassium 3.5 - 5.1 mmol/L 4.0 3.3(L) 3.7  Chloride 101 - 111 mmol/L 106 103 104  CO2 22 - 32 mmol/L 20(L) 23 23  Calcium 8.9 - 10.3 mg/dL 8.7(L) 8.7(L) 8.8(L)    CBC: CBC Latest Ref Rng & Units 06/08/2017 06/04/2017 05/31/2017  WBC 4.0 - 10.5 K/uL 8.4 8.8 8.0  Hemoglobin 12.0 - 15.0 g/dL 9.7(L) 9.8(L) 10.5(L)  Hematocrit 36.0 - 46.0 % 30.5(L) 29.6(L) 31.3(L)  Platelets 150 - 400 K/uL 468(H) 387 273    CBG: Recent Labs  Lab 06/12/17 0645 06/12/17 1113 06/12/17 1630 06/12/17 2101 06/13/17 0649  GLUCAP 117* 98 212* 116* 111*    Brief HPI:    [name] a 84 y.o.femalewith history of CAD, HTN, right hip hemiarthroplasty(still has hip precautions);who was admitted on 05/26/17 after fall with periprosthetic femur fracture and scalp hematoma. History taken from chart review, family, patient, and care giver. CT abdomen done in ED due to distension and questioned left rectal mass. She was taken to OR for ORIF right femoral shaft with plate and screw. ABLA treated with transfusion but continues to  decline with Hgb 7.0, plan for 2 more units per primary team. She is to be NWB RLE for weeks to months per Dr. Ninfa Linden.  Therapy ongoing and patient with functional decline. CIR recommended for follow up therapy.   Hospital Course: Maureen Morales was admitted to rehab 05/30/2017 for inpatient therapies to consist of PT and OT at least three hours five days a week. Past admission physiatrist, therapy team and rehab RN have worked together to provide customized collaborative inpatient rehab. She was transfused with 2 units PRBC at admission and Hgb has improved to 9.8. Check of lytes showed acute on chronic renal failure and she was encouraged to increase fluid intake. Nutritional supplements were ordered bid to help promote wound healing. SCr has improved to 0.97. Lantus was discontinued and glucotrol was resumed at 2.5 mg daily. Her po intake has improved with supplemental food from home and BS are relatively well controlled. Right thigh edema is resolving and she continued to have 2+ pedal edema. She was educated to keep elevating RLE when seated.   Her blood pressures were monitored on bid basis and noted to be labile. Toprol was titrated upwards and hydralazine was added with improvement in BP control.  Incision right thigh is healing well without signs or symptoms of infection and staples were removed without difficulty on 02/07. . Blister on right heel has decreased in size with fluctuance at edges. Foam dressing and Prevalon boot has been used for treatment and pressure relief.  Anal fistula/Fisure is healing nicely. Loose stools have been managed with addition of fibercon  BID.  Pain right hip has been managed with use of tylenol prn.  Her anxiety and endurance has been steadily improving and she has progressed to  Ray assist level. She will continue to receive follow up [place], [place] and [place] by [facility] after discharge.    Rehab course: During patient's stay in rehab weekly team conferences  were held to monitor patient's progress, set goals and discuss barriers to discharge. At admission, patient required Max assist with ADL tasks and mobility. She  has had improvement in activity tolerance, balance, postural control as well as ability to compensate for deficits. Family education was completed with daughter and sitter regarding all aspects of care and safety.  She is able to complete ADL tasks with min assist. She requires min assist for transfers and to ambulate 10' X2 with min assist and RW.    Disposition: 01-Home or Self Care  Diet: heart healthy diet.   Wound Care: Wash area with soap and water-- keep wound clean and dry. Paint blister on right heel with betadine twice a day--do not pull the scab (let it fall off)  Special Instructions: 1. No weight on right leg till cleared by MD. Continue right total hip precautions.    Discharge Instructions    Ambulatory referral to Physical Medicine Rehab   Complete by:  As directed    1-2 weeks transitional care appt     Allergies as of 06/13/2017      Reactions   Asa [aspirin] Anaphylaxis   Aspirin Swelling   Atropine Other (See Comments)   Reaction: unknown   Belladonna Alkaloids Other (See Comments)   Reaction: unknown   Codeine Other (See Comments)   Reaction: unknown   Darvon Other (See Comments)   Reaction: unknown   Darvon [propoxyphene] Nausea And Vomiting   Demerol [meperidine] Nausea And Vomiting   Ferrous Sulfate Diarrhea   Meperidine And Related    Methocarbamol Other (See Comments)   Reaction: unknown   Penicillins Other (See Comments)   Reaction: unknown Has patient had a PCN reaction causing immediate rash, facial/tongue/throat swelling, SOB or lightheadedness with hypotension: Unknown Has patient had a PCN reaction causing severe rash involving mucus membranes or skin necrosis: Unknown Has patient had a PCN reaction that required hospitalization: Unknown Has patient had a PCN reaction occurring within  the last 10 years: Unknown If all of the above answers are "NO", then may proceed with Cephalosporin use.   Penicillins Other (See Comments)   Pt and family unsure of details   Sulfa Antibiotics Other (See Comments)   Reaction: unknown   Iron Other (See Comments)   High dose prescription gives her diarrhea      Medication List    STOP taking these medications   HYDROcodone-acetaminophen 5-325 MG tablet Commonly known as:  NORCO/VICODIN   insulin glargine 100 UNIT/ML injection Commonly known as:  LANTUS     TAKE these medications   atorvastatin 40 MG tablet Commonly known as:  LIPITOR Take 40 mg by mouth daily.   bacitracin ointment Apply topically 2 (two) times daily.   fenofibrate 160 MG tablet Take 160 mg by mouth daily.   glipiZIDE 5 MG tablet Commonly known as:  GLUCOTROL Take 0.5 tablets (2.5 mg total) by mouth daily before breakfast. What changed:  when to take this   glucose blood test strip Commonly known as:  BAYER CONTOUR TEST Use  as instructed   hydrALAZINE 25 MG tablet Commonly known as:  APRESOLINE Take 1 tablet (25 mg total) by mouth every 8 (eight) hours.   loratadine 10 MG tablet Commonly known as:  CLARITIN Take 10 mg daily by mouth.   losartan 100 MG tablet Commonly known as:  COZAAR Take 100 mg by mouth daily.   metoprolol succinate 50 MG 24 hr tablet Commonly known as:  TOPROL-XL Take 1 1/2 pills with or immediately following a meal. What changed:    how much to take  how to take this  when to take this  additional instructions   nitroGLYCERIN 0.4 MG SL tablet Commonly known as:  NITROSTAT Place 1 tablet (0.4 mg total) under the tongue every 5 (five) minutes as needed for chest pain.   polycarbophil 625 MG tablet Commonly known as:  FIBERCON Take 2 tablets (1,250 mg total) by mouth 2 (two) times daily.   polyethylene glycol packet Commonly known as:  MIRALAX / GLYCOLAX Take 17 g by mouth daily as needed for mild  constipation.   sertraline 50 MG tablet Commonly known as:  ZOLOFT Take 1 tablet (50 mg total) by mouth daily.   vitamin C 500 MG tablet Commonly known as:  ASCORBIC ACID Take 1 tablet (500 mg total) by mouth 2 (two) times daily.   Vitamin D (Ergocalciferol) 50000 units Caps capsule Commonly known as:  DRISDOL Take 1 capsule (50,000 Units total) by mouth every 7 (seven) days.      Follow-up Information    [name], MD Follow up on 06/25/2017.   Specialty:  Internal Medicine Why:  @ 11:00 am (hospital follow up appt) Contact information: [place] [address] [phone]        Jamse Arn, MD Follow up.   Specialty:  Physical Medicine and Rehabilitation Why:  Office will call you with follow up appointment Contact information: [address] [place] [address] [phone]        Mcarthur Rossetti, MD. Call in 1 day(s).   Specialty:  Orthopedic Surgery Why:  for follow up appointment Contact information: [place] [address] [phone]        Wellington Hampshire, MD. Call in 1 day(s).   Specialty:  Cardiology Why:  for follow up appointment/Discuss blood thinners Contact information: [place] [address] [phone]           Signed: Bary Leriche 06/13/2017, 6:48 PM

## 2017-06-12 NOTE — Progress Notes (Signed)
Physical Therapy Session Note  Patient Details  Name: Maureen Morales MRN: [id number] Date of Birth: 01-30-1923  Today's Date: 06/12/2017 PT Individual Time: 1330-1400 PT Individual Time Calculation (min): 30 min   Short Term Goals: Week 2:  PT Short Term Goal 1 (Week 2): = LTG  Skilled Therapeutic Interventions/Progress Updates:    Session focused on functional transfers, w/c mobility for general strengthening/endurance and mobility with supervision, and dynamic standing balance while performing functional task with 1 UE support (varied between R and L during reaching tasks). Pt performed transfers with close supervision to steadying assist without RW and able to maintain NWB status without cues. Dynamic balance with supervision to occasional steadying assist during functional reaching activity. Pt excited for d/c tomorrow and handoff to OT.  Therapy Documentation Precautions:  Precautions Precautions: Fall, Posterior Hip Precaution Comments: pt recalls 2/3 posterior hip precautions; sign posted in room Restrictions Weight Bearing Restrictions: Yes RLE Weight Bearing: Non weight bearing   Pain: Reports intermittent muscle spasms in RLE - declines intervention at this time  See Function Navigator for Current Functional Status.   Therapy/Group: Individual Therapy  Canary Brim Ivory Broad, PT, DPT  06/12/2017, 2:44 PM

## 2017-06-12 NOTE — Progress Notes (Signed)
Physical Therapy Discharge Summary  Patient Details  Name: Maureen Morales MRN: [id number] Date of Birth: August 06, 1922  Today's Date: 06/12/2017 PT Individual Time: 1100-1158 PT Individual Time Calculation (min): 58 min   Pt performed stand pivot transfers from w/c, toilet and mat with supervision assist with RW.  Gait 2 x 10' with RW with min A for balance, cues for safety with turning to sit.  Simulated car transfer with min A, pt with good safety awareness and technique.  Standing balance and tolerance with card game with recreation therapist.  Pt states she is excited and ready for d/c home tomorrow.  Patient has met 7 of 7 long term goals due to improved activity tolerance, improved balance, increased strength, decreased pain and ability to compensate for deficits.  Patient to discharge at an ambulatory level [place].    Reasons goals not met: n/a  Recommendation:  Patient will benefit from ongoing skilled PT services in home health setting to continue to advance safe functional mobility, address ongoing impairments in balance, strength, gait, and minimize fall risk.  Equipment: No equipment provided  Reasons for discharge: treatment goals met and discharge from hospital  Patient/family agrees with progress made and goals achieved: Yes  PT Discharge Precautions/Restrictions Precautions Precautions: Fall;Posterior Hip Restrictions Weight Bearing Restrictions: Yes RLE Weight Bearing: Non weight bearing Pain Pain Assessment Pain Score: 0-No pain  Cognition Overall Cognitive Status: Within Functional Limits for tasks assessed Arousal/Alertness: Awake/alert Orientation Level: Oriented X4 Sensation Sensation Light Touch: Appears Intact Proprioception: Appears Intact Coordination Gross Motor Movements are Fluid and Coordinated: Yes Fine Motor Movements are Fluid and Coordinated: Yes Motor  Motor Motor - Discharge Observations: generalized weakness   Trunk/Postural  Assessment  Cervical Assessment Cervical Assessment: (fwd head) Thoracic Assessment Thoracic Assessment: (mild kyphosis) Lumbar Assessment Lumbar Assessment: (posterior pelvic tilt) Postural Control Postural Control: Within Functional Limits  Balance Static Sitting Balance Static Sitting - Level of Assistance: 7: Independent Dynamic Sitting Balance Dynamic Sitting - Level of Assistance: 7: Independent Static Standing Balance Static Standing - Level of Assistance: 5: Stand by assistance Dynamic Standing Balance Dynamic Standing - Level of Assistance: 4: Min assist Extremity Assessment      RLE Strength RLE Overall Strength Comments: grossly 3-/5, hip precautions LLE Assessment LLE Assessment: Within Functional Limits   See Function Navigator for Current Functional Status.  DONAWERTH,KAREN 06/12/2017, 11:21 AM   Lars Masson, PT, DPT 2:47 PM 06/12/17

## 2017-06-13 LAB — GLUCOSE, CAPILLARY: Glucose-Capillary: 111 mg/dL — ABNORMAL HIGH (ref 65–99)

## 2017-06-13 MED ORDER — SERTRALINE HCL 50 MG PO TABS: 50.0000 mg | ORAL_TABLET | Freq: Every day | ORAL | 0 refills | Status: DC

## 2017-06-13 MED ORDER — HYDRALAZINE HCL 25 MG PO TABS: 25.0000 mg | ORAL_TABLET | Freq: Three times a day (TID) | ORAL | 0 refills | Status: DC

## 2017-06-13 MED ORDER — CALCIUM POLYCARBOPHIL 625 MG PO TABS: 1250.0000 mg | ORAL_TABLET | Freq: Two times a day (BID) | ORAL | 0 refills | Status: DC

## 2017-06-13 MED ORDER — METOPROLOL SUCCINATE ER 50 MG PO TB24: ORAL_TABLET | ORAL | 0 refills | Status: DC

## 2017-06-13 NOTE — Progress Notes (Signed)
Pt. Got d/c orders and instructions.Pt. Ready to go home with her family

## 2017-06-13 NOTE — Progress Notes (Signed)
Social Work  Discharge Note  The overall goal for the admission was met for:   Discharge location: Yes - home with family and 24/7 private duty caregivers  Length of Stay: Yes - 14 days  Discharge activity level: Yes - min assist overall  Home/community participation: Yes  Services provided included: MD, RD, PT, OT, RN, TR, Pharmacy and SW  Financial Services: Medicare and Private Insurance: [place]  Follow-up services arranged: Home Health: RN, PT, OT via [facility] and Patient/Family has no preference for HH/DME agencies  Comments (or additional information):  Patient/Family verbalized understanding of follow-up arrangements: Yes  Individual responsible for coordination of the follow-up plan: pt  Confirmed correct DME delivered: NA - had all needed DME    Archana Eckman

## 2017-06-13 NOTE — Progress Notes (Signed)
[facility] PHYSICAL MEDICINE & REHABILITATION     PROGRESS NOTE  Subjective/Complaints:  Pt seen sitting at the edge of the bed this AM.  She slept well overnight. She is ready for discharge. She has questions about bathing.  ROS: Denies CP, SOB, nausea, vomiting, diarrhea.  Objective: Vital Signs: Blood pressure (!) 157/55, pulse 78, temperature 98.1 F (36.7 C), temperature source Oral, resp. rate 16, height 5\' 1"  (1.549 m), weight 61.5 kg (135 lb 9.3 oz), SpO2 97 %. No results found. No results for input(s): WBC, HGB, HCT, PLT in the last 72 hours. No results for input(s): NA, K, CL, GLUCOSE, BUN, CREATININE, CALCIUM in the last 72 hours.  Invalid input(s): CO CBG (last 3)  Recent Labs    06/12/17 1630 06/12/17 2101 06/13/17 0649  GLUCAP 212* 116* 111*    Wt Readings from Last 3 Encounters:  06/13/17 61.5 kg (135 lb 9.3 oz)  05/22/17 57.2 kg (126 lb)  05/14/17 58.1 kg (128 lb)    Physical Exam:  BP (!) 157/55 (BP Location: Left Arm)   Pulse 78   Temp 98.1 F (36.7 C) (Oral)   Resp 16   Ht 5\' 1"  (1.549 m)   Wt 61.5 kg (135 lb 9.3 oz)   SpO2 97%   BMI 25.62 kg/m  Constitutional: She appears well-developed and well-nourished. No distress.  HENT: Normocephalic and atraumatic.  Eyes: EOM are normal. No discharge.  Cardiovascular: RRRR. Murmur heard. No JVD. Respiratory: Effort normal.  Clear.  GI: Bowel sounds are normal. She exhibits no distension.  Musculoskeletal: Edema right hip and thigh, continues to improve Neurological: She is alert and oriented.  Motor: B/l UE: 4/5 proximal to distal RLE: HF, 4/5, ADF/PF 4+/5 (gradually improving) LLE: HF, KE 4+/5, ADF/PF 4+/5  Skin: Right thigh incision C/D/I  Right heel blister, less fluctuant.   Psychiatric: She has a normal mood and affect. Her behavior is normal.    Assessment/Plan: 1. Functional deficits secondary to right peri-prosthetic hip fracture which require 3+ hours per day of interdisciplinary therapy  in a comprehensive inpatient rehab setting. Physiatrist is providing close team supervision and 24 hour management of active medical problems listed below. Physiatrist and rehab team continue to assess barriers to discharge/monitor patient progress toward functional and medical goals.  Function:  Bathing Bathing position Bathing activity did not occur: Safety/medical concerns Position: Wheelchair/chair at sink  Bathing parts Body parts bathed by patient: Right arm, Left arm, Chest, Abdomen, Front perineal area, Right upper leg, Left upper leg, Left lower leg, Buttocks Body parts bathed by helper: Back  Bathing assist Assist Level: Assistive device, Touching or steadying assistance(Pt > 75%) Assistive Device Comment: long handled sponge    Upper Body Dressing/Undressing Upper body dressing   What is the patient wearing?: Button up shirt, Bra Bra - Perfomed by patient: Thread/unthread right bra strap, Thread/unthread left bra strap, Hook/unhook bra (pull down sports bra)       Button up shirt - Perfomed by patient: Thread/unthread right sleeve, Thread/unthread left sleeve, Pull shirt around back, Button/unbutton shirt      Upper body assist Assist Level: Set up   Set up : To obtain clothing/put away  Lower Body Dressing/Undressing Lower body dressing Lower body dressing/undressing activity did not occur: Safety/medical concerns What is the patient wearing?: Pants, Non-skid slipper socks   Underwear - Performed by helper: Thread/unthread right underwear leg, Thread/unthread left underwear leg, Pull underwear up/down Pants- Performed by patient: Thread/unthread right pants leg, Thread/unthread left pants  leg, Pull pants up/down Pants- Performed by helper: Thread/unthread right pants leg Non-skid slipper socks- Performed by patient: Don/doff left sock Non-skid slipper socks- Performed by helper: Don/doff right sock       Shoes - Performed by helper: Don/doff left shoe(L foot only)           Lower body assist Assist for lower body dressing: Touching or steadying assistance (Pt > 75%)      Toileting Toileting   Toileting steps completed by patient: Adjust clothing prior to toileting, Adjust clothing after toileting, Performs perineal hygiene Toileting steps completed by helper: Performs perineal hygiene Toileting Assistive Devices: Grab bar or rail  Toileting assist Assist level: Touching or steadying assistance (Pt.75%)   Transfers Chair/bed transfer Chair/bed transfer activity did not occur: Safety/medical concerns(Hg 7.0 - medically unstable) Chair/bed transfer method: Stand pivot Chair/bed transfer assist level: Supervision or verbal cues Chair/bed transfer assistive device: Medical sales representative Ambulation activity did not occur: Safety/medical concerns(Hg 7.0 - medically unstable)   Max distance: 10 Assist level: Touching or steadying assistance (Pt > 75%)   Wheelchair Wheelchair activity did not occur: Safety/medical concerns(Hg 7.0 - medically unstable) Type: Manual Max wheelchair distance: 75 Assist Level: Supervision or verbal cues  Cognition Comprehension Comprehension assist level: Follows complex conversation/direction with extra time/assistive device  Expression Expression assist level: Expresses complex ideas: With extra time/assistive device  Social Interaction Social Interaction assist level: Interacts appropriately with others with medication or extra time (anti-anxiety, antidepressant).  Problem Solving Problem solving assist level: Solves complex 90% of the time/cues < 10% of the time  Memory Memory assist level: More than reasonable amount of time    Medical Problem List and Plan: 1.  Deficits with mobility, transfers, and self-care secondary to right peri-prosthetic hip fracture.    DC today   Will see patient for transitional care management in 1-2 weeks 2.  DVT Prophylaxis/Anticoagulation: Mechanical: Sequential  compression devices, below knee Bilateral lower extremities--no blood thinners due to history of hematochezia.    Vascular study negative for DVT on 1/31 3. Pain Management: oxycodone prn 4. Mood: team to provide ego support to help manage pain and anxiety 5. Neuropsych: This patient is capable of making decisions on her own behalf. 6. Skin/Wound Care: Monitor scalp wound, anal wound and right hip incision for healing.  7. Fluids/Electrolytes/Nutrition: Monitor I/Os.  8. ABLA:    Hemoglobin 9.7 on 2/8   Cont to monitor 9. HTN: Monitor BP bid--continue Cozaar.   Toprolol increased on 2/3   Hydralazine 10 TID started on 2/5, increased to 25 on 2/9   Overall stable, will need ambulatory monitoring for potential adjustments..  Vitals:   06/12/17 2052 06/13/17 0500  BP: (!) 158/64 (!) 157/55  Pulse: 77 78  Resp:  16  Temp:  98.1 F (36.7 C)  SpO2: 100% 97%  10. T2DM: Hgb A1C-7.0. BS    D/ced Lantus 2/5    Discussed with pharmacy, will change to Glucatrol 2.5 daily (PTA med) on 2/5    CBG (last 3)  Recent Labs    06/12/17 1630 06/12/17 2101 06/13/17 0649  GLUCAP 212* 116* 111*    Relatively controlled on 2/13 11. H/o Asthma: Scheduled nebs qid, changed to PRN. 12. Right heel ulcer: Ordered prevalon boot. Foam dressing. Added protein supplement to help with wound healing.  13. Recent Anal fistulotomy: Keep area clean and dry.  14. Chronic renal failure?: Baseline SCr around 1.2?    Creatinine 1.18 on 2/8  Encourage fluids   15. Right hemiarthroplasty: She is to continue right total hip precautions as well as  NWB due to recent surgery.  16. CAD with moderate aortic stenosis/Grade 2 diastolic dysfunction: Monitor daily weights. On lipitor, Cozaar and Toprol. Avoid overload.  Filed Weights   06/11/17 0554 06/12/17 0500 06/13/17 0500  Weight: 60.3 kg (132 lb 15 oz) 61.3 kg (135 lb 2.3 oz) 61.5 kg (135 lb 9.3 oz)  17. Hypoalbuminemia  Supplement initiated 18. Hypokalemia  K+   4.0 on 2/8 after supplementation    Cont to montor 19. Relatively freq stools per pt   No diarhhea per report and pt  Fiber added 2/4, increased on 2/7   Overall improving  LOS (Days) 14 A FACE TO FACE EVALUATION WAS PERFORMED  Ankit Lorie Phenix 06/13/2017 8:12 AM

## 2017-06-13 NOTE — Discharge Instructions (Signed)
Inpatient Rehab Discharge Instructions  Maureen Morales Discharge date and time: 06/13/17   Activities/Precautions/ Functional Status: Activity: activity as tolerated--NO weight on right leg. Continue total hip precautions.  Diet: cardiac diet Wound Care: Wash area with soap and water-- keep wound clean and dry. Paint blister on right heel with betadine twice a day--do not pull the scab (let it fall off)  Functional status:  ___ No restrictions     ___ Walk up steps independently ___ 24/7 supervision/assistance   ___ Walk up steps with assistance ___ Intermittent supervision/assistance  ___ Bathe/dress independently ___ Walk with walker     ___ Bathe/dress with assistance ___ Walk Independently    ___ Shower independently ___ Walk with assistance    ___ Shower with assistance ___ No alcohol     ___ Return to work/school ________     COMMUNITY REFERRALS UPON DISCHARGE:    Home Health:   PT     OT      RN                    Agency:  [facility] Phone: [phone]        Special Instructions:    My questions have been answered and I understand these instructions. I will adhere to these goals and the provided educational materials after my discharge from the hospital.  Patient/Caregiver Signature _______________________________ Date __________  Clinician Signature _______________________________________ Date __________  Please bring this form and your medication list with you to all your follow-up doctor's appointments.

## 2017-06-14 ENCOUNTER — Telehealth: Payer: Self-pay | Admitting: Registered Nurse

## 2017-06-14 NOTE — Telephone Encounter (Signed)
Transitional Care call Transitional Care Call Completed, Appointment Confirmed, Address Confirmed, New Patient Packet Mailed Transitional Care Call Questions answered by [name] an [place]  Patient name: Maureen Morales DOB: [birth year] 1. Are you/is patient experiencing any problems since coming home? Yes: Diarrhea , she's taking Fibercon as prescribed. Instructed to F/U with PCP if this persists. They verbalize understanding.  a. Are there any questions regarding any aspect of care? no 2. Are there any questions regarding medications administration/dosing? No a. Are meds being taken as prescribed? Yes, except Miralax b. "Patient should review meds with caller to confirm" Medication List Reviewed with [place] 3. Have there been any falls? No 4. Has Home Health been to the house and/or have they contacted you? Yes: [facility] a. If not, have you tried to contact them? NA b. Can we help you contact them? No 5. Are bowels and bladder emptying properly? Yes, see above note a. Are there any unexpected incontinence issues? No b. If applicable, is patient following bowel/bladder programs? NA 6. Any fevers, problems with breathing, unexpected pain? No 7. Are there any skin problems or new areas of breakdown? No 8. Has the patient/family member arranged specialty MD follow up (ie cardiology/neurology/renal/surgical/etc.)?  Reviewed will [name], she will have family call for follow up appointments with Dr. Ninfa Linden and Dr. Rogue Jury a. Can we help arrange? No 9. Does the patient need any other services or support that we can help arrange? No 10. Are caregivers following through as expected in assisting the patient? Yes: she has 24 hour private duty care givers. 11. Has the patient quit smoking, drinking alcohol, or using drugs as recommended? She doesn't smoke, drink alcohol or uses illicit drugs.   Appointment date/time 06/22/2017, arrival time 10:30 for  11:00 appointment with Dr. Posey Pronto, at  [phone]

## 2017-06-15 ENCOUNTER — Telehealth: Payer: Self-pay | Admitting: Internal Medicine

## 2017-06-15 ENCOUNTER — Telehealth: Payer: Self-pay | Admitting: Physical Medicine & Rehabilitation

## 2017-06-15 DIAGNOSIS — Z9181 History of falling: Secondary | ICD-10-CM | POA: Diagnosis not present

## 2017-06-15 DIAGNOSIS — N183 Chronic kidney disease, stage 3 (moderate): Secondary | ICD-10-CM | POA: Diagnosis not present

## 2017-06-15 DIAGNOSIS — I130 Hypertensive heart and chronic kidney disease with heart failure and stage 1 through stage 4 chronic kidney disease, or unspecified chronic kidney disease: Secondary | ICD-10-CM | POA: Diagnosis not present

## 2017-06-15 DIAGNOSIS — I252 Old myocardial infarction: Secondary | ICD-10-CM | POA: Diagnosis not present

## 2017-06-15 DIAGNOSIS — E785 Hyperlipidemia, unspecified: Secondary | ICD-10-CM | POA: Diagnosis not present

## 2017-06-15 DIAGNOSIS — F411 Generalized anxiety disorder: Secondary | ICD-10-CM | POA: Diagnosis not present

## 2017-06-15 DIAGNOSIS — W19XXXD Unspecified fall, subsequent encounter: Secondary | ICD-10-CM | POA: Diagnosis not present

## 2017-06-15 DIAGNOSIS — J4520 Mild intermittent asthma, uncomplicated: Secondary | ICD-10-CM | POA: Diagnosis not present

## 2017-06-15 DIAGNOSIS — E8809 Other disorders of plasma-protein metabolism, not elsewhere classified: Secondary | ICD-10-CM | POA: Diagnosis not present

## 2017-06-15 DIAGNOSIS — I2510 Atherosclerotic heart disease of native coronary artery without angina pectoris: Secondary | ICD-10-CM | POA: Diagnosis not present

## 2017-06-15 DIAGNOSIS — M9701XD Periprosthetic fracture around internal prosthetic right hip joint, subsequent encounter: Secondary | ICD-10-CM | POA: Diagnosis not present

## 2017-06-15 DIAGNOSIS — E1122 Type 2 diabetes mellitus with diabetic chronic kidney disease: Secondary | ICD-10-CM | POA: Diagnosis not present

## 2017-06-15 DIAGNOSIS — L89610 Pressure ulcer of right heel, unstageable: Secondary | ICD-10-CM | POA: Diagnosis not present

## 2017-06-15 DIAGNOSIS — Z96641 Presence of right artificial hip joint: Secondary | ICD-10-CM | POA: Diagnosis not present

## 2017-06-15 DIAGNOSIS — E1151 Type 2 diabetes mellitus with diabetic peripheral angiopathy without gangrene: Secondary | ICD-10-CM | POA: Diagnosis not present

## 2017-06-15 DIAGNOSIS — Z7984 Long term (current) use of oral hypoglycemic drugs: Secondary | ICD-10-CM | POA: Diagnosis not present

## 2017-06-15 DIAGNOSIS — I5032 Chronic diastolic (congestive) heart failure: Secondary | ICD-10-CM | POA: Diagnosis not present

## 2017-06-15 DIAGNOSIS — H9042 Sensorineural hearing loss, unilateral, left ear, with unrestricted hearing on the contralateral side: Secondary | ICD-10-CM | POA: Diagnosis not present

## 2017-06-15 DIAGNOSIS — F329 Major depressive disorder, single episode, unspecified: Secondary | ICD-10-CM | POA: Diagnosis not present

## 2017-06-15 NOTE — Telephone Encounter (Signed)
PT with [facility] needs a call back at [phone] (no name given) needs clarification on patient's WB Status, ROM Status or exercises she can do.  Please call.

## 2017-06-15 NOTE — Telephone Encounter (Signed)
Pt is to be non weight on right leg with right total hip precautions.  Thanks.

## 2017-06-15 NOTE — Telephone Encounter (Signed)
Copied from [place] [id number]. Topic: General - Other >> Jun 15, 2017  8:53 AM Carolyn Stare wrote: Yvone Neu with [facility] call to say she went out to see the pt and there is no skills needed only Pt

## 2017-06-18 ENCOUNTER — Telehealth: Payer: Self-pay | Admitting: Internal Medicine

## 2017-06-18 MED ORDER — CLINDAMYCIN HCL 300 MG PO CAPS: ORAL_CAPSULE | ORAL | 2 refills | Status: DC

## 2017-06-18 NOTE — Telephone Encounter (Signed)
Information given to physical Therapist.  Dr Posey Pronto did not address the ROM question.  The patient has an appt 06/10/17 Wednesday with Dr Posey Pronto.  Please update PT [facility] with further directions after that visit.

## 2017-06-18 NOTE — Telephone Encounter (Signed)
Copied from [place]. Topic: General - Other >> Jun 18, 2017  9:52 AM Conception Chancy, NT wrote: Leotis Shames is calling from [facility] she is a physical therapist she is needing verbal orders for PT  1x a week for 1week and then 2x a week for 4 weeks.   Cb# [phone]

## 2017-06-18 NOTE — Telephone Encounter (Signed)
Clindamycin prescr[facility]  2 capsules one hour prior to procefure

## 2017-06-18 NOTE — Telephone Encounter (Signed)
ROM question was included in total hip precautions, ie.no flexion beyond 90 deg, no twisting at the hip, no crossing of the legs, no internal rotation, etc.  Thanks.

## 2017-06-18 NOTE — Telephone Encounter (Signed)
FYI verbal given for PT.

## 2017-06-18 NOTE — Telephone Encounter (Signed)
Pt is having a dental procedure Wednesday, 06/20/17.  Dr. Eugenie Birks (dentist) requesting Clindamycin be called in for patient. Usual dose prior to procedure is 600 mg. Pt is allergic to PCN, Sulfa antibiotics.  Contact Almyra Free'   [phone]

## 2017-06-19 ENCOUNTER — Telehealth: Payer: Self-pay | Admitting: Internal Medicine

## 2017-06-19 DIAGNOSIS — Z96641 Presence of right artificial hip joint: Secondary | ICD-10-CM | POA: Diagnosis not present

## 2017-06-19 DIAGNOSIS — I130 Hypertensive heart and chronic kidney disease with heart failure and stage 1 through stage 4 chronic kidney disease, or unspecified chronic kidney disease: Secondary | ICD-10-CM | POA: Diagnosis not present

## 2017-06-19 DIAGNOSIS — M9701XD Periprosthetic fracture around internal prosthetic right hip joint, subsequent encounter: Secondary | ICD-10-CM | POA: Diagnosis not present

## 2017-06-19 DIAGNOSIS — I252 Old myocardial infarction: Secondary | ICD-10-CM | POA: Diagnosis not present

## 2017-06-19 DIAGNOSIS — E1151 Type 2 diabetes mellitus with diabetic peripheral angiopathy without gangrene: Secondary | ICD-10-CM | POA: Diagnosis not present

## 2017-06-19 DIAGNOSIS — I2510 Atherosclerotic heart disease of native coronary artery without angina pectoris: Secondary | ICD-10-CM | POA: Diagnosis not present

## 2017-06-19 NOTE — Telephone Encounter (Signed)
Copied from [place] [phone]. Topic: Quick Communication - See Telephone Encounter >> Jun 19, 2017  9:59 AM Boyd Kerbs wrote: CRM for notification. See Telephone encounter for:   Maureen Morales  please let doctor know daughter declined for OT.[name]  06/19/17.

## 2017-06-20 ENCOUNTER — Encounter: Payer: Medicare Other | Attending: Physical Medicine & Rehabilitation | Admitting: Physical Medicine & Rehabilitation

## 2017-06-20 ENCOUNTER — Encounter: Payer: Self-pay | Admitting: Physical Medicine & Rehabilitation

## 2017-06-20 VITALS — BP 143/64 | HR 67

## 2017-06-20 DIAGNOSIS — I350 Nonrheumatic aortic (valve) stenosis: Secondary | ICD-10-CM | POA: Insufficient documentation

## 2017-06-20 DIAGNOSIS — H9192 Unspecified hearing loss, left ear: Secondary | ICD-10-CM | POA: Insufficient documentation

## 2017-06-20 DIAGNOSIS — Z9049 Acquired absence of other specified parts of digestive tract: Secondary | ICD-10-CM | POA: Diagnosis not present

## 2017-06-20 DIAGNOSIS — I119 Hypertensive heart disease without heart failure: Secondary | ICD-10-CM | POA: Diagnosis not present

## 2017-06-20 DIAGNOSIS — X58XXXA Exposure to other specified factors, initial encounter: Secondary | ICD-10-CM | POA: Insufficient documentation

## 2017-06-20 DIAGNOSIS — I10 Essential (primary) hypertension: Secondary | ICD-10-CM | POA: Diagnosis not present

## 2017-06-20 DIAGNOSIS — R269 Unspecified abnormalities of gait and mobility: Secondary | ICD-10-CM | POA: Insufficient documentation

## 2017-06-20 DIAGNOSIS — I252 Old myocardial infarction: Secondary | ICD-10-CM | POA: Insufficient documentation

## 2017-06-20 DIAGNOSIS — Z96641 Presence of right artificial hip joint: Secondary | ICD-10-CM | POA: Diagnosis not present

## 2017-06-20 DIAGNOSIS — J45909 Unspecified asthma, uncomplicated: Secondary | ICD-10-CM | POA: Diagnosis not present

## 2017-06-20 DIAGNOSIS — S72001A Fracture of unspecified part of neck of right femur, initial encounter for closed fracture: Secondary | ICD-10-CM

## 2017-06-20 DIAGNOSIS — L97419 Non-pressure chronic ulcer of right heel and midfoot with unspecified severity: Secondary | ICD-10-CM | POA: Insufficient documentation

## 2017-06-20 DIAGNOSIS — M9701XA Periprosthetic fracture around internal prosthetic right hip joint, initial encounter: Secondary | ICD-10-CM | POA: Insufficient documentation

## 2017-06-20 DIAGNOSIS — E1151 Type 2 diabetes mellitus with diabetic peripheral angiopathy without gangrene: Secondary | ICD-10-CM | POA: Insufficient documentation

## 2017-06-20 DIAGNOSIS — I2510 Atherosclerotic heart disease of native coronary artery without angina pectoris: Secondary | ICD-10-CM | POA: Insufficient documentation

## 2017-06-20 DIAGNOSIS — S0003XA Contusion of scalp, initial encounter: Secondary | ICD-10-CM | POA: Diagnosis not present

## 2017-06-20 DIAGNOSIS — M9701XS Periprosthetic fracture around internal prosthetic right hip joint, sequela: Secondary | ICD-10-CM

## 2017-06-20 DIAGNOSIS — K219 Gastro-esophageal reflux disease without esophagitis: Secondary | ICD-10-CM | POA: Diagnosis not present

## 2017-06-20 DIAGNOSIS — F329 Major depressive disorder, single episode, unspecified: Secondary | ICD-10-CM | POA: Insufficient documentation

## 2017-06-20 DIAGNOSIS — E785 Hyperlipidemia, unspecified: Secondary | ICD-10-CM | POA: Diagnosis not present

## 2017-06-20 DIAGNOSIS — L89610 Pressure ulcer of right heel, unstageable: Secondary | ICD-10-CM | POA: Diagnosis not present

## 2017-06-20 DIAGNOSIS — E119 Type 2 diabetes mellitus without complications: Secondary | ICD-10-CM | POA: Diagnosis not present

## 2017-06-20 DIAGNOSIS — K603 Anal fistula: Secondary | ICD-10-CM | POA: Diagnosis not present

## 2017-06-20 NOTE — Telephone Encounter (Signed)
Patient aware.

## 2017-06-20 NOTE — Progress Notes (Signed)
Subjective:    Patient ID: Maureen Morales, female    DOB: 1923/01/21, 84 y.o.   MRN: [id number]  HPI 84 y.o. female with history of CAD, HTN, right hip hemiarthroplasty (hip precautions) presents for transitional care management after receiving CIR for periprosthetic femur fracture and scalp hematoma.  Admit date: 05/30/2017 Discharge date: 06/13/2017  Since discharge, have been communication with PT due to some confusion with ROM restriction.  Reviewed and note and sent response in person.  Pt states her wound is healing.  Her pain is improving.  She notes she is using a pillow under her thigh, educated on abstaining.  She sees PCP next week.  She does not have a follow up with Ortho. She called [facility], and will see them in future. BP is relatively controlled. CBGs have been controlled.  She sees general surgery next week for anal fistula. Her stools have improved.    Therapies: 2/week DME: Previously possessed Mobility: Wheelchair at all times  Pain Inventory Average Pain 1 Pain Right Now 0 My pain is intermittent and sharp  In the last 24 hours, has pain interfered with the following? General activity 0 Relation with others 0 Enjoyment of life 0 What TIME of day is your pain at its worst? night Sleep (in general) Good  Pain is worse with: sitting Pain improves with: . Relief from Meds: 9  Mobility ability to climb steps?  no do you drive?  no use a wheelchair needs help with transfers  Function retired I need assistance with the following:  dressing, bathing, toileting, household duties and shopping  Neuro/Psych No problems in this area  Prior Studies Any changes since last visit?  no  Physicians involved in your care Any changes since last visit?  no   Family History  Problem Relation Age of Onset  . Breast cancer Mother 45  . Colon cancer Father   . Stroke Sister   . Cancer Mother   . Breast cancer Sister 11  . Colon cancer Brother   . Colon cancer  Brother    Social History   Socioeconomic History  . Marital status: Married    Spouse name: [name]  . Number of children: 4  . Years of education: 14  . Highest education level: High school graduate  Social Needs  . Financial resource strain: None  . Food insecurity - worry: None  . Food insecurity - inability: None  . Transportation needs - medical: None  . Transportation needs - non-medical: None  Occupational History  . None  Tobacco Use  . Smoking status: Never Smoker  . Smokeless tobacco: Never Used  Substance and Sexual Activity  . Alcohol use: No    Frequency: Never  . Drug use: No  . Sexual activity: No  Other Topics Concern  . None  Social History Narrative   ** Merged History Encounter **       Married 4 children Never smoker Denies alcohol use Full Code   Past Surgical History:  Procedure Laterality Date  . ABDOMINAL HYSTERECTOMY    . BLADDER SURGERY    . CARDIAC CATHETERIZATION    . CHOLECYSTECTOMY    . COLONOSCOPY     06/16/1987, 02/20/1995, 05/04/1999, 02/14/2005, 05/17/2007  . ESOPHAGOGASTRODUODENOSCOPY     05/11/1987, 06/13/1995, 10/09/1995, 05/04/1999, 01/16/2002  . FLEXIBLE SIGMOIDOSCOPY N/A 05/02/2017   Procedure: FLEXIBLE SIGMOIDOSCOPY;  Surgeon: Manya Silvas, MD;  Location: [facility] ENDOSCOPY;  Service: Endoscopy;  Laterality: N/A;  . HEMORRHOID SURGERY N/A 05/14/2017  Procedure: EXTERNAL THROMBOSED HEMORRHOIDECTOMY;  Surgeon: Robert Bellow, MD;  Location: [facility] ORS;  Service: General;  Laterality: N/A;  . HIP ARTHROPLASTY Right 02/17/2017   Procedure: ARTHROPLASTY BIPOLAR HIP (HEMIARTHROPLASTY);  Surgeon: Claud Kelp, MD;  Location: [facility] ORS;  Service: Orthopedics;  Laterality: Right;  . LEFT HEART CATH AND CORONARY ANGIOGRAPHY N/A 08/31/2016   Procedure: Left Heart Cath and Coronary Angiography;  Surgeon: Wellington Hampshire, MD;  Location: [place] [facility];  Service: Cardiovascular;  Laterality: N/A;  . ORIF PERIPROSTHETIC FRACTURE  Right 05/27/2017  . ORIF PERIPROSTHETIC FRACTURE Right 05/27/2017   Procedure: OPEN REDUCTION INTERNAL FIXATION (ORIF) PERIPROSTHETIC FRACTURE;  Surgeon: Mcarthur Rossetti, MD;  Location: [place];  Service: Orthopedics;  Laterality: Right;  . TOTAL HIP ARTHROPLASTY Right   . Theadora Rama prolapse     Past Medical History:  Diagnosis Date  . Asthma without status asthmaticus    unspecified  . CAD (coronary artery disease)   . Colitis    unspecified  . Complication of anesthesia    sensitive to anesthesia  . Depression   . Diabetes mellitus   . Diabetes mellitus type 2, uncomplicated ([place])   . Essential hypertension   . GERD (gastroesophageal reflux disease)   . 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 HLD (hyperlipidemia)   . Hyperlipidemia    unspecified  . Hypertension   . Myocardial infarction ([place]) 2018  . Peripheral vascular disease ([place])   . Positive H. pylori test   . Viral gastroenteritis due to [place]-like agent Nov 2012   BP (!) 143/64   Pulse 67   SpO2 97%   Opioid Risk Score:   Fall Risk Score:  `1  Depression screen PHQ 2/9  Depression screen [facility] 2/9 05/22/2015 07/08/2014 01/27/2013 04/17/2012  Decreased Interest 0 0 0 1  Down, Depressed, Hopeless 0 0 0 1  PHQ - 2 Score 0 0 0 2  Altered sleeping - - - 0  Tired, decreased energy - - - 0  Change in appetite - - - 0  Feeling bad or failure about yourself  - - - 0  Trouble concentrating - - - 0  Moving slowly or fidgety/restless - - - 0  Suicidal thoughts - - - 0  PHQ-9 Score - - - 2  Some recent data might be hidden     Review of Systems  Constitutional: Negative.   HENT: Negative.   Eyes: Negative.   Respiratory: Negative.   Cardiovascular: Negative.   Gastrointestinal: Negative.   Endocrine: Negative.   Genitourinary: Negative.   Musculoskeletal: Positive for arthralgias, gait problem and joint swelling.  Skin: Negative.   Allergic/Immunologic: Negative.   Hematological: Negative.     Psychiatric/Behavioral: Negative.   All other systems reviewed and are negative.     Objective:   Physical Exam Constitutional: She appears well-developed and well-nourished. No distress.  HENT: Normocephalic and atraumatic.  Eyes: EOM are normal. No discharge.  Cardiovascular: RRR. Murmur heard. No JVD. Respiratory: Effort normal.  Clear.  GI: Bowel sounds are normal. She exhibits no distension.  Musculoskeletal: Mild edema right hip and thigh Neg homan's RLE Neurological: She is alert and oriented.  Motor: B/l UE: 4+/5 proximal to distal RLE: HF, 4/5, ADF/PF 4+/5  LLE: HF, KE 4+/5, ADF/PF 5/5  Skin: Right thigh healing Right heel with eschar, non-fluctuant.   Psychiatric: She has a normal mood and affect. Her behavior is normal.     Assessment & Plan:  84 y.o. female with history of CAD, HTN, right hip hemiarthroplasty (hip precautions) presents for transitional care management after receiving CIR for periprosthetic femur fracture and scalp hematoma.  1.  Deficits with mobility, transfers, and self-care secondary to right peri-prosthetic hip fracture.   Cont therapies - written communication provided as well as electronic regarding ROM restrictions and WB status (NWB)  Educated pt on ROM restrictions  Follow up with Ortho- needs appointment  2. Pain Management  Controlled at present  3. HTN:    Cont meds  Relatively controlled at present  4. T2DM  Cont meds  Relatively controlled at present  5. Right heel ulcer  Cont prevalon boot.   Cont dressing changed  6. Recent Anal fistulotomy:   Follow up with Surgery  7. Right hemiarthroplasty:   Continue right total hip precautions as well as  NWB due to recent surgery.   8. CAD with moderate aortic stenosis/Grade 2 diastolic dysfunction:    Follow up with [facility]  9. Gait abnormality  Cont wheelchair for safety  Cont therapies  Meds reviewed Referrals reviewed - Needs Ortho follow up All questions answered

## 2017-06-22 ENCOUNTER — Encounter: Payer: Medicare Other | Admitting: Physical Medicine & Rehabilitation

## 2017-06-22 DIAGNOSIS — Z96641 Presence of right artificial hip joint: Secondary | ICD-10-CM | POA: Diagnosis not present

## 2017-06-22 DIAGNOSIS — I2510 Atherosclerotic heart disease of native coronary artery without angina pectoris: Secondary | ICD-10-CM | POA: Diagnosis not present

## 2017-06-22 DIAGNOSIS — I252 Old myocardial infarction: Secondary | ICD-10-CM | POA: Diagnosis not present

## 2017-06-22 DIAGNOSIS — M9701XD Periprosthetic fracture around internal prosthetic right hip joint, subsequent encounter: Secondary | ICD-10-CM | POA: Diagnosis not present

## 2017-06-22 DIAGNOSIS — E1151 Type 2 diabetes mellitus with diabetic peripheral angiopathy without gangrene: Secondary | ICD-10-CM | POA: Diagnosis not present

## 2017-06-22 DIAGNOSIS — I130 Hypertensive heart and chronic kidney disease with heart failure and stage 1 through stage 4 chronic kidney disease, or unspecified chronic kidney disease: Secondary | ICD-10-CM | POA: Diagnosis not present

## 2017-06-25 ENCOUNTER — Inpatient Hospital Stay: Payer: Medicare Other | Admitting: Internal Medicine

## 2017-06-26 DIAGNOSIS — I2510 Atherosclerotic heart disease of native coronary artery without angina pectoris: Secondary | ICD-10-CM | POA: Diagnosis not present

## 2017-06-26 DIAGNOSIS — Z96641 Presence of right artificial hip joint: Secondary | ICD-10-CM | POA: Diagnosis not present

## 2017-06-26 DIAGNOSIS — I130 Hypertensive heart and chronic kidney disease with heart failure and stage 1 through stage 4 chronic kidney disease, or unspecified chronic kidney disease: Secondary | ICD-10-CM | POA: Diagnosis not present

## 2017-06-26 DIAGNOSIS — I252 Old myocardial infarction: Secondary | ICD-10-CM | POA: Diagnosis not present

## 2017-06-26 DIAGNOSIS — M9701XD Periprosthetic fracture around internal prosthetic right hip joint, subsequent encounter: Secondary | ICD-10-CM | POA: Diagnosis not present

## 2017-06-26 DIAGNOSIS — E1151 Type 2 diabetes mellitus with diabetic peripheral angiopathy without gangrene: Secondary | ICD-10-CM | POA: Diagnosis not present

## 2017-06-28 ENCOUNTER — Ambulatory Visit: Payer: Medicare Other | Admitting: General Surgery

## 2017-06-28 ENCOUNTER — Encounter: Payer: Self-pay | Admitting: General Surgery

## 2017-06-28 VITALS — BP 124/78 | HR 84 | Ht 61.0 in | Wt 125.0 lb

## 2017-06-28 DIAGNOSIS — K603 Anal fistula: Secondary | ICD-10-CM

## 2017-06-28 NOTE — Progress Notes (Signed)
Patient ID: Maureen Morales, female   DOB: July 08, 1922, 84 y.o.   MRN: [id number]  Chief Complaint  Patient presents with  . Routine Post Op    HPI Maureen Morales is a 84 y.o. female here today for her follow up  hemorrhoid surgery done on 05/14/2017. No bleeding but some drainage. Patient states she "gotta go" to the bathroom when she has to urge to defecate.  HPI  Past Medical History:  Diagnosis Date  . Asthma without status asthmaticus    unspecified  . CAD (coronary artery disease)   . Colitis    unspecified  . Complication of anesthesia    sensitive to anesthesia  . Depression   . Diabetes mellitus   . Diabetes mellitus type 2, uncomplicated ([place])   . Essential hypertension   . GERD (gastroesophageal reflux disease)   . 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 HLD (hyperlipidemia)   . Hyperlipidemia    unspecified  . Hypertension   . Myocardial infarction ([place]) 2018  . Peripheral vascular disease ([place])   . Positive H. pylori test   . Viral gastroenteritis due to [place]-like agent Nov 2012    Past Surgical History:  Procedure Laterality Date  . ABDOMINAL HYSTERECTOMY    . BLADDER SURGERY    . CARDIAC CATHETERIZATION    . CHOLECYSTECTOMY    . COLONOSCOPY     06/16/1987, 02/20/1995, 05/04/1999, 02/14/2005, 05/17/2007  . ESOPHAGOGASTRODUODENOSCOPY     05/11/1987, 06/13/1995, 10/09/1995, 05/04/1999, 01/16/2002  . FLEXIBLE SIGMOIDOSCOPY N/A 05/02/2017   Procedure: FLEXIBLE SIGMOIDOSCOPY;  Surgeon: Manya Silvas, MD;  Location: [facility] ENDOSCOPY;  Service: Endoscopy;  Laterality: N/A;  . HEMORRHOID SURGERY N/A 05/14/2017   Procedure: EXTERNAL THROMBOSED HEMORRHOIDECTOMY;  Surgeon: Robert Bellow, MD;  Location: [facility] ORS;  Service: General;  Laterality: N/A;  . HIP ARTHROPLASTY Right 02/17/2017   Procedure: ARTHROPLASTY BIPOLAR HIP (HEMIARTHROPLASTY);  Surgeon: Claud Kelp, MD;  Location: [facility] ORS;  Service: Orthopedics;  Laterality: Right;  . LEFT HEART CATH  AND CORONARY ANGIOGRAPHY N/A 08/31/2016   Procedure: Left Heart Cath and Coronary Angiography;  Surgeon: Wellington Hampshire, MD;  Location: [place] [facility] LAB;  Service: Cardiovascular;  Laterality: N/A;  . ORIF PERIPROSTHETIC FRACTURE Right 05/27/2017  . ORIF PERIPROSTHETIC FRACTURE Right 05/27/2017   Procedure: OPEN REDUCTION INTERNAL FIXATION (ORIF) PERIPROSTHETIC FRACTURE;  Surgeon: Mcarthur Rossetti, MD;  Location: [place];  Service: Orthopedics;  Laterality: Right;  . TOTAL HIP ARTHROPLASTY Right   . uterian prolapse      Family History  Problem Relation Age of Onset  . Breast cancer Mother 36  . Colon cancer Father   . Stroke Sister   . Cancer Mother   . Breast cancer Sister 46  . Colon cancer Brother   . Colon cancer Brother     Social History Social History   Tobacco Use  . Smoking status: Never Smoker  . Smokeless tobacco: Never Used  Substance Use Topics  . Alcohol use: No    Frequency: Never  . Drug use: No    Allergies  Allergen Reactions  . Asa [Aspirin] Anaphylaxis  . Aspirin Swelling  . Atropine Other (See Comments)    Reaction: unknown  . Belladonna Alkaloids Other (See Comments)    Reaction: unknown  . Codeine Other (See Comments)    Reaction: unknown  . Darvon Other (See Comments)    Reaction: unknown  . Darvon [Propoxyphene] Nausea And Vomiting  . Demerol [  Meperidine] Nausea And Vomiting  . Ferrous Sulfate Diarrhea  . Meperidine And Related   . Methocarbamol Other (See Comments)    Reaction: unknown  . Penicillins Other (See Comments)    Reaction: unknown Has patient had a PCN reaction causing immediate rash, facial/tongue/throat swelling, SOB or lightheadedness with hypotension: Unknown Has patient had a PCN reaction causing severe rash involving mucus membranes or skin necrosis: Unknown Has patient had a PCN reaction that required hospitalization: Unknown Has patient had a PCN reaction occurring within the last 10 years: Unknown If all of  the above answers are "NO", then may proceed with Cephalosporin use.   [name] Penicillins Other (See Comments)    Pt and family unsure of details  . Sulfa Antibiotics Other (See Comments)    Reaction: unknown  . Iron Other (See Comments)    High dose prescription gives her diarrhea    Current Outpatient Medications  Medication Sig Dispense Refill  . atorvastatin (LIPITOR) 40 MG tablet Take 40 mg by mouth daily.    . clindamycin (CLEOCIN) 300 MG capsule 2 capsules one hour before dental procedure 2 capsule 2  . fenofibrate 160 MG tablet Take 160 mg by mouth daily.    [name] glipiZIDE (GLUCOTROL) 5 MG tablet Take 0.5 tablets (2.5 mg total) by mouth daily before breakfast. (Patient taking differently: Take 2.5 mg by mouth every evening. ) 90 tablet 1  . glucose blood (BAYER CONTOUR TEST) test strip Use as instructed 100 each 5  . hydrALAZINE (APRESOLINE) 25 MG tablet Take 1 tablet (25 mg total) by mouth every 8 (eight) hours. 90 tablet 0  . loratadine (CLARITIN) 10 MG tablet Take 10 mg daily by mouth.     . losartan (COZAAR) 100 MG tablet Take 100 mg by mouth daily.    . metoprolol succinate (TOPROL-XL) 50 MG 24 hr tablet Take 1 1/2 pills with or immediately following a meal. 45 tablet 0  . nitroGLYCERIN (NITROSTAT) 0.4 MG SL tablet Place 1 tablet (0.4 mg total) under the tongue every 5 (five) minutes as needed for chest pain. 10 tablet 0  . polycarbophil (FIBERCON) 625 MG tablet Take 2 tablets (1,250 mg total) by mouth 2 (two) times daily. 60 tablet 0  . sertraline (ZOLOFT) 50 MG tablet Take 1 tablet (50 mg total) by mouth daily. 30 tablet 0  . vitamin C (ASCORBIC ACID) 500 MG tablet Take 1 tablet (500 mg total) by mouth 2 (two) times daily. 60 tablet 2  . Vitamin D, Ergocalciferol, (DRISDOL) 50000 units CAPS capsule Take 1 capsule (50,000 Units total) by mouth every 7 (seven) days. 12 capsule 0   No current facility-administered medications for this visit.     Review of Systems Review of Systems   Constitutional: Negative.   Respiratory: Negative.   Cardiovascular: Negative.   Gastrointestinal: Positive for diarrhea.    Blood pressure 124/78, pulse 84, height 5\' 1"  (1.549 m), weight 125 lb (56.7 kg).  Physical Exam Physical Exam  Constitutional: She is oriented to person, place, and time. She appears well-developed and well-nourished.  Genitourinary:     Neurological: She is alert and oriented to person, place, and time.  Skin: Skin is warm and dry.    Assessment    Doing well post anal fistula debridement and unroofing.    Plan Return in three weeks. . Patient to resume daily fiber supplements..    HPI, Physical Exam, Assessment and Plan have been scribed under the direction and in the presence of Dellis Filbert  Bary Castilla, MD.  Gaspar Cola, CMA  I have completed the exam and reviewed the above documentation for accuracy and completeness.  I agree with the above.  Haematologist has been used and any errors in dictation or transcription are unintentional.  Hervey Ard, M.D., F.A.C.S.  Forest Gleason Kosei Rhodes 06/29/2017, 7:54 PM

## 2017-06-28 NOTE — Patient Instructions (Addendum)
Return in three weeks.. 

## 2017-06-29 DIAGNOSIS — E1151 Type 2 diabetes mellitus with diabetic peripheral angiopathy without gangrene: Secondary | ICD-10-CM | POA: Diagnosis not present

## 2017-06-29 DIAGNOSIS — Z96641 Presence of right artificial hip joint: Secondary | ICD-10-CM | POA: Diagnosis not present

## 2017-06-29 DIAGNOSIS — I130 Hypertensive heart and chronic kidney disease with heart failure and stage 1 through stage 4 chronic kidney disease, or unspecified chronic kidney disease: Secondary | ICD-10-CM | POA: Diagnosis not present

## 2017-06-29 DIAGNOSIS — I252 Old myocardial infarction: Secondary | ICD-10-CM | POA: Diagnosis not present

## 2017-06-29 DIAGNOSIS — M9701XD Periprosthetic fracture around internal prosthetic right hip joint, subsequent encounter: Secondary | ICD-10-CM | POA: Diagnosis not present

## 2017-06-29 DIAGNOSIS — I2510 Atherosclerotic heart disease of native coronary artery without angina pectoris: Secondary | ICD-10-CM | POA: Diagnosis not present

## 2017-07-02 DIAGNOSIS — I130 Hypertensive heart and chronic kidney disease with heart failure and stage 1 through stage 4 chronic kidney disease, or unspecified chronic kidney disease: Secondary | ICD-10-CM | POA: Diagnosis not present

## 2017-07-02 DIAGNOSIS — E1151 Type 2 diabetes mellitus with diabetic peripheral angiopathy without gangrene: Secondary | ICD-10-CM | POA: Diagnosis not present

## 2017-07-02 DIAGNOSIS — I2510 Atherosclerotic heart disease of native coronary artery without angina pectoris: Secondary | ICD-10-CM | POA: Diagnosis not present

## 2017-07-02 DIAGNOSIS — I252 Old myocardial infarction: Secondary | ICD-10-CM | POA: Diagnosis not present

## 2017-07-02 DIAGNOSIS — Z96641 Presence of right artificial hip joint: Secondary | ICD-10-CM | POA: Diagnosis not present

## 2017-07-02 DIAGNOSIS — M9701XD Periprosthetic fracture around internal prosthetic right hip joint, subsequent encounter: Secondary | ICD-10-CM | POA: Diagnosis not present

## 2017-07-04 ENCOUNTER — Inpatient Hospital Stay (INDEPENDENT_AMBULATORY_CARE_PROVIDER_SITE_OTHER): Payer: Medicare Other | Admitting: Orthopaedic Surgery

## 2017-07-06 ENCOUNTER — Telehealth: Payer: Self-pay | Admitting: Internal Medicine

## 2017-07-06 DIAGNOSIS — I252 Old myocardial infarction: Secondary | ICD-10-CM | POA: Diagnosis not present

## 2017-07-06 DIAGNOSIS — I130 Hypertensive heart and chronic kidney disease with heart failure and stage 1 through stage 4 chronic kidney disease, or unspecified chronic kidney disease: Secondary | ICD-10-CM | POA: Diagnosis not present

## 2017-07-06 DIAGNOSIS — E1151 Type 2 diabetes mellitus with diabetic peripheral angiopathy without gangrene: Secondary | ICD-10-CM | POA: Diagnosis not present

## 2017-07-06 DIAGNOSIS — M9701XD Periprosthetic fracture around internal prosthetic right hip joint, subsequent encounter: Secondary | ICD-10-CM | POA: Diagnosis not present

## 2017-07-06 DIAGNOSIS — I2510 Atherosclerotic heart disease of native coronary artery without angina pectoris: Secondary | ICD-10-CM | POA: Diagnosis not present

## 2017-07-06 DIAGNOSIS — Z96641 Presence of right artificial hip joint: Secondary | ICD-10-CM | POA: Diagnosis not present

## 2017-07-06 MED ORDER — HYDRALAZINE HCL 50 MG PO TABS: 50.0000 mg | ORAL_TABLET | Freq: Three times a day (TID) | ORAL | 2 refills | Status: DC

## 2017-07-06 NOTE — Telephone Encounter (Signed)
Dr. Derrel Nip will advise patient tonight.

## 2017-07-06 NOTE — Telephone Encounter (Unsigned)
Copied from [place] [phone]. Topic: Quick Communication - See Telephone Encounter >> Jul 06, 2017 10:41 AM Percell Belt A wrote: CRM for notification. See Telephone encounter for:  Jeani Hawking [facility] [phone] Pt had elevated BP today 158/96 rest and 10 of mild therapy and went up 180/100  They discontinued  therapy for the day    07/06/17.

## 2017-07-06 NOTE — Telephone Encounter (Signed)
Please ask patient to increase hydralazine dose to 50 mg every 8 hours.  I will [facility] to [facility]

## 2017-07-09 ENCOUNTER — Ambulatory Visit (INDEPENDENT_AMBULATORY_CARE_PROVIDER_SITE_OTHER): Payer: Medicare Other

## 2017-07-09 ENCOUNTER — Other Ambulatory Visit: Payer: Self-pay | Admitting: Internal Medicine

## 2017-07-09 ENCOUNTER — Ambulatory Visit (INDEPENDENT_AMBULATORY_CARE_PROVIDER_SITE_OTHER): Payer: Medicare Other | Admitting: Orthopaedic Surgery

## 2017-07-09 ENCOUNTER — Encounter (INDEPENDENT_AMBULATORY_CARE_PROVIDER_SITE_OTHER): Payer: Self-pay | Admitting: Orthopaedic Surgery

## 2017-07-09 DIAGNOSIS — M9701XD Periprosthetic fracture around internal prosthetic right hip joint, subsequent encounter: Secondary | ICD-10-CM | POA: Diagnosis not present

## 2017-07-09 MED ORDER — METOPROLOL SUCCINATE ER 50 MG PO TB24: ORAL_TABLET | ORAL | 11 refills | Status: DC

## 2017-07-09 NOTE — Progress Notes (Signed)
The patient is now 6 weeks status post open reduction and fixation of a periprosthetic femur fracture around a right hip hemiarthroplasty.  She has been nonweightbearing on that right side.  There is concern with the family about a heel ulcer and breakdown from where she had pressure on her heel when she was in the hospital.  On exam she tolerates me much easier putting her right hip and right knee through range of motion.  Her incision on the lateral aspect of her right thigh is well-healed.  She does have a stage II-III decubitus ulcer on her right heel that is not weeping and the skin is intact over this so I certainly would not debrided but she needs to keep pressure still off of her heel especially when she is in bed with pillows propped up behind her calf and I explained this to her and her family in detail.  As far as x-rays  , Her right femur does show some signs of interval healing.  At this point I will advance her to just 50% weightbearing only.  We will see her back in 4 weeks with a repeat 2 views of the right femur.

## 2017-07-10 DIAGNOSIS — I130 Hypertensive heart and chronic kidney disease with heart failure and stage 1 through stage 4 chronic kidney disease, or unspecified chronic kidney disease: Secondary | ICD-10-CM | POA: Diagnosis not present

## 2017-07-10 DIAGNOSIS — Z96641 Presence of right artificial hip joint: Secondary | ICD-10-CM | POA: Diagnosis not present

## 2017-07-10 DIAGNOSIS — I252 Old myocardial infarction: Secondary | ICD-10-CM | POA: Diagnosis not present

## 2017-07-10 DIAGNOSIS — E1151 Type 2 diabetes mellitus with diabetic peripheral angiopathy without gangrene: Secondary | ICD-10-CM | POA: Diagnosis not present

## 2017-07-10 DIAGNOSIS — M9701XD Periprosthetic fracture around internal prosthetic right hip joint, subsequent encounter: Secondary | ICD-10-CM | POA: Diagnosis not present

## 2017-07-10 DIAGNOSIS — I2510 Atherosclerotic heart disease of native coronary artery without angina pectoris: Secondary | ICD-10-CM | POA: Diagnosis not present

## 2017-07-12 ENCOUNTER — Telehealth: Payer: Self-pay | Admitting: Internal Medicine

## 2017-07-12 DIAGNOSIS — Z96641 Presence of right artificial hip joint: Secondary | ICD-10-CM | POA: Diagnosis not present

## 2017-07-12 DIAGNOSIS — M9701XD Periprosthetic fracture around internal prosthetic right hip joint, subsequent encounter: Secondary | ICD-10-CM | POA: Diagnosis not present

## 2017-07-12 DIAGNOSIS — E1151 Type 2 diabetes mellitus with diabetic peripheral angiopathy without gangrene: Secondary | ICD-10-CM | POA: Diagnosis not present

## 2017-07-12 DIAGNOSIS — I2510 Atherosclerotic heart disease of native coronary artery without angina pectoris: Secondary | ICD-10-CM | POA: Diagnosis not present

## 2017-07-12 DIAGNOSIS — I130 Hypertensive heart and chronic kidney disease with heart failure and stage 1 through stage 4 chronic kidney disease, or unspecified chronic kidney disease: Secondary | ICD-10-CM | POA: Diagnosis not present

## 2017-07-12 DIAGNOSIS — I252 Old myocardial infarction: Secondary | ICD-10-CM | POA: Diagnosis not present

## 2017-07-12 NOTE — Telephone Encounter (Signed)
Copied from [place]. Topic: Inquiry >> Jul 12, 2017  8:59 AM Scherrie Gerlach wrote: Reason for CRM: Elder Cyphers, home health PT with [facility] requesting an extension of PT 2 wk / 2 1 wk / 2

## 2017-07-13 NOTE — Telephone Encounter (Signed)
Agreed.  Thanks.  

## 2017-07-13 NOTE — Telephone Encounter (Signed)
Maureen Morales called and would like a call back about order today because she is planning to go and visit pt on Monday morning. Please call her back, thanks.

## 2017-07-13 NOTE — Telephone Encounter (Signed)
FYI verbal given for PT.

## 2017-07-16 DIAGNOSIS — M9701XD Periprosthetic fracture around internal prosthetic right hip joint, subsequent encounter: Secondary | ICD-10-CM | POA: Diagnosis not present

## 2017-07-16 DIAGNOSIS — I252 Old myocardial infarction: Secondary | ICD-10-CM | POA: Diagnosis not present

## 2017-07-16 DIAGNOSIS — I130 Hypertensive heart and chronic kidney disease with heart failure and stage 1 through stage 4 chronic kidney disease, or unspecified chronic kidney disease: Secondary | ICD-10-CM | POA: Diagnosis not present

## 2017-07-16 DIAGNOSIS — I2510 Atherosclerotic heart disease of native coronary artery without angina pectoris: Secondary | ICD-10-CM | POA: Diagnosis not present

## 2017-07-16 DIAGNOSIS — E1151 Type 2 diabetes mellitus with diabetic peripheral angiopathy without gangrene: Secondary | ICD-10-CM | POA: Diagnosis not present

## 2017-07-16 DIAGNOSIS — Z96641 Presence of right artificial hip joint: Secondary | ICD-10-CM | POA: Diagnosis not present

## 2017-07-18 ENCOUNTER — Encounter: Payer: Medicare Other | Attending: Physical Medicine & Rehabilitation | Admitting: Physical Medicine & Rehabilitation

## 2017-07-18 ENCOUNTER — Encounter: Payer: Self-pay | Admitting: Physical Medicine & Rehabilitation

## 2017-07-18 VITALS — BP 144/71 | HR 73 | Resp 14

## 2017-07-18 DIAGNOSIS — M9701XA Periprosthetic fracture around internal prosthetic right hip joint, initial encounter: Secondary | ICD-10-CM | POA: Diagnosis not present

## 2017-07-18 DIAGNOSIS — L97419 Non-pressure chronic ulcer of right heel and midfoot with unspecified severity: Secondary | ICD-10-CM | POA: Diagnosis not present

## 2017-07-18 DIAGNOSIS — M9701XS Periprosthetic fracture around internal prosthetic right hip joint, sequela: Secondary | ICD-10-CM

## 2017-07-18 DIAGNOSIS — I10 Essential (primary) hypertension: Secondary | ICD-10-CM | POA: Diagnosis not present

## 2017-07-18 DIAGNOSIS — R600 Localized edema: Secondary | ICD-10-CM

## 2017-07-18 DIAGNOSIS — L89610 Pressure ulcer of right heel, unstageable: Secondary | ICD-10-CM

## 2017-07-18 DIAGNOSIS — I252 Old myocardial infarction: Secondary | ICD-10-CM | POA: Insufficient documentation

## 2017-07-18 DIAGNOSIS — J45909 Unspecified asthma, uncomplicated: Secondary | ICD-10-CM | POA: Diagnosis not present

## 2017-07-18 DIAGNOSIS — H9192 Unspecified hearing loss, left ear: Secondary | ICD-10-CM | POA: Insufficient documentation

## 2017-07-18 DIAGNOSIS — S0003XA Contusion of scalp, initial encounter: Secondary | ICD-10-CM | POA: Insufficient documentation

## 2017-07-18 DIAGNOSIS — K603 Anal fistula: Secondary | ICD-10-CM | POA: Diagnosis not present

## 2017-07-18 DIAGNOSIS — R269 Unspecified abnormalities of gait and mobility: Secondary | ICD-10-CM | POA: Diagnosis not present

## 2017-07-18 DIAGNOSIS — E1151 Type 2 diabetes mellitus with diabetic peripheral angiopathy without gangrene: Secondary | ICD-10-CM | POA: Diagnosis not present

## 2017-07-18 DIAGNOSIS — E785 Hyperlipidemia, unspecified: Secondary | ICD-10-CM | POA: Diagnosis not present

## 2017-07-18 DIAGNOSIS — Z96641 Presence of right artificial hip joint: Secondary | ICD-10-CM

## 2017-07-18 DIAGNOSIS — S72001A Fracture of unspecified part of neck of right femur, initial encounter for closed fracture: Secondary | ICD-10-CM

## 2017-07-18 DIAGNOSIS — I350 Nonrheumatic aortic (valve) stenosis: Secondary | ICD-10-CM | POA: Diagnosis not present

## 2017-07-18 DIAGNOSIS — Z9049 Acquired absence of other specified parts of digestive tract: Secondary | ICD-10-CM | POA: Diagnosis not present

## 2017-07-18 DIAGNOSIS — F329 Major depressive disorder, single episode, unspecified: Secondary | ICD-10-CM | POA: Diagnosis not present

## 2017-07-18 DIAGNOSIS — I2510 Atherosclerotic heart disease of native coronary artery without angina pectoris: Secondary | ICD-10-CM | POA: Insufficient documentation

## 2017-07-18 DIAGNOSIS — K219 Gastro-esophageal reflux disease without esophagitis: Secondary | ICD-10-CM | POA: Diagnosis not present

## 2017-07-18 DIAGNOSIS — I119 Hypertensive heart disease without heart failure: Secondary | ICD-10-CM | POA: Diagnosis not present

## 2017-07-18 NOTE — Progress Notes (Signed)
Subjective:    Patient ID: Maureen Morales, female    DOB: [birth year], 84 y.o.   MRN: [id number]  HPI 84 y.o. female with history of CAD, HTN, right hip hemiarthroplasty (hip precautions) presents for follow up for for periprosthetic femur fracture and scalp hematoma.  Last clinic visit 06/20/17.  Since that time, pt's daughter notes pt attempting to ambulate without assistance. She is still in therapies.  She is now 50% WB after seeing Ortho. Pain is controlled. BP is improving. She continues to see surgery regarding anal fistula.  Denies falls.   Pain Inventory Average Pain 0 Pain Right Now 0 My pain is intermittent and sharp  In the last 24 hours, has pain interfered with the following? General activity 8 Relation with others 8 Enjoyment of life 8 What TIME of day is your pain at its worst? night Sleep (in general) Good  Pain is worse with: sitting Pain improves with: rest Relief from Meds: 9  Mobility ability to climb steps?  no do you drive?  no use a wheelchair needs help with transfers  Function retired I need assistance with the following:  dressing, bathing, toileting, meal prep, household duties and shopping  Neuro/Psych trouble walking  Prior Studies Any changes since last visit?  no  Physicians involved in your care Any changes since last visit?  no   Family History  Problem Relation Age of Onset  . Breast cancer Mother 64  . Colon cancer Father   . Stroke Sister   . Cancer Mother   . Breast cancer Sister 62  . Colon cancer Brother   . Colon cancer Brother    Social History   Socioeconomic History  . Marital status: Married    Spouse name: Maureen Morales  . Number of children: 4  . Years of education: 44  . Highest education level: High school graduate  Social Needs  . Financial resource strain: None  . Food insecurity - worry: None  . Food insecurity - inability: None  . Transportation needs - medical: None  . Transportation needs -  non-medical: None  Occupational History  . None  Tobacco Use  . Smoking status: Never Smoker  . Smokeless tobacco: Never Used  Substance and Sexual Activity  . Alcohol use: No    Frequency: Never  . Drug use: No  . Sexual activity: No  Other Topics Concern  . None  Social History Narrative   ** Merged History Encounter **       Married 4 children Never smoker Denies alcohol use Full Code   Past Surgical History:  Procedure Laterality Date  . ABDOMINAL HYSTERECTOMY    . BLADDER SURGERY    . CARDIAC CATHETERIZATION    . CHOLECYSTECTOMY    . COLONOSCOPY     06/16/1987, 02/20/1995, 05/04/1999, 02/14/2005, 05/17/2007  . ESOPHAGOGASTRODUODENOSCOPY     05/11/1987, 06/13/1995, 10/09/1995, 05/04/1999, 01/16/2002  . FLEXIBLE SIGMOIDOSCOPY N/A 05/02/2017   Procedure: FLEXIBLE SIGMOIDOSCOPY;  Surgeon: Maureen Silvas, MD;  Location: [facility] ENDOSCOPY;  Service: Endoscopy;  Laterality: N/A;  . HEMORRHOID SURGERY N/A 05/14/2017   Procedure: EXTERNAL THROMBOSED HEMORRHOIDECTOMY;  Surgeon: Maureen Bellow, MD;  Location: [facility] ORS;  Service: General;  Laterality: N/A;  . HIP ARTHROPLASTY Right 02/17/2017   Procedure: ARTHROPLASTY BIPOLAR HIP (HEMIARTHROPLASTY);  Surgeon: Maureen Kelp, MD;  Location: [facility] ORS;  Service: Orthopedics;  Laterality: Right;  . LEFT HEART CATH AND CORONARY ANGIOGRAPHY N/A 08/31/2016   Procedure: Left Heart Cath and Coronary Angiography;  Surgeon: Maureen Hampshire, MD;  Location: [place] CV LAB;  Service: Cardiovascular;  Laterality: N/A;  . ORIF PERIPROSTHETIC FRACTURE Right 05/27/2017  . ORIF PERIPROSTHETIC FRACTURE Right 05/27/2017   Procedure: OPEN REDUCTION INTERNAL FIXATION (ORIF) PERIPROSTHETIC FRACTURE;  Surgeon: Maureen Rossetti, MD;  Location: [place];  Service: Orthopedics;  Laterality: Right;  . TOTAL HIP ARTHROPLASTY Right   . Maureen Morales prolapse     Past Medical History:  Diagnosis Date  . Asthma without status asthmaticus    unspecified    . CAD (coronary artery disease)   . Colitis    unspecified  . Complication of anesthesia    sensitive to anesthesia  . Depression   . Diabetes mellitus   . Diabetes mellitus type 2, uncomplicated ([place])   . Essential hypertension   . GERD (gastroesophageal reflux disease)   . 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 HLD (hyperlipidemia)   . Hyperlipidemia    unspecified  . Hypertension   . Myocardial infarction ([place]) 2018  . Peripheral vascular disease ([place])   . Positive H. pylori test   . Viral gastroenteritis due to [place]-like agent Nov 2012   BP (!) 144/71   Pulse 73   Resp 14   SpO2 98%   Opioid Risk Score:   Fall Risk Score:  `1  Depression screen PHQ 2/9  Depression screen [facility] 2/9 05/22/2015 07/08/2014 01/27/2013 04/17/2012  Decreased Interest 0 0 0 1  Down, Depressed, Hopeless 0 0 0 1  PHQ - 2 Score 0 0 0 2  Altered sleeping - - - 0  Tired, decreased energy - - - 0  Change in appetite - - - 0  Feeling bad or failure about yourself  - - - 0  Trouble concentrating - - - 0  Moving slowly or fidgety/restless - - - 0  Suicidal thoughts - - - 0  PHQ-9 Score - - - 2  Some recent data might be hidden     Review of Systems  Constitutional: Negative.   HENT: Negative.   Eyes: Negative.   Respiratory: Positive for wheezing.   Cardiovascular: Negative.   Gastrointestinal: Negative.   Endocrine: Negative.        High blood sugar  Genitourinary: Negative.   Musculoskeletal: Positive for arthralgias, gait problem and joint swelling.  Skin: Negative.   Allergic/Immunologic: Negative.   Hematological: Negative.   Psychiatric/Behavioral: Negative.   All other systems reviewed and are negative.     Objective:   Physical Exam Constitutional: She appears well-developed and well-nourished. No distress.  HENT: Normocephalic and atraumatic.  Eyes: EOM are normal. No discharge.  Cardiovascular: RRR. Murmur heard. No JVD. Respiratory: Effort normal.  Clear.   GI: Bowel sounds are normal. She exhibits no distension.  Musculoskeletal: Mild edema right hip and thigh Edema right foot with TTP popliteal fossa. Neg homan's RLE Neurological: She is alert and oriented.  Motor: B/l UE: 4+/5 proximal to distal RLE: HF, 4-/5, ADF/PF 4/5  LLE: HF, KE 4/5, ADF/PF 5/5  Skin: Right heel with eschar, non-fluctuant.   Psychiatric: She has a normal mood and affect. Her behavior is normal.     Assessment & Plan:  84 y.o. female with history of CAD, HTN, right hip hemiarthroplasty (hip precautions) presents for follow up for periprosthetic femur fracture and scalp hematoma.  1.  Deficits with mobility, transfers, and self-care secondary to right peri-prosthetic hip fracture.   Cont therapies   Cont follow up with  Ortho   Now 50% WB  2. Pain Management  Controlled at present  3. Right heel ulcer  Cont prevalon boot qhs.   Cont dressing changes  4. Recent Anal fistulotomy:   Follow up with Surgery  5. Gait abnormality  Cont wheelchair for safety  Cont therapies  6. Right foot edema  Last U/S on 1/31 WNL  Will repeat U/S given increased edema  Encouraged low salt diet

## 2017-07-19 ENCOUNTER — Other Ambulatory Visit: Payer: Self-pay

## 2017-07-19 ENCOUNTER — Encounter: Payer: Self-pay | Admitting: General Surgery

## 2017-07-19 ENCOUNTER — Ambulatory Visit (INDEPENDENT_AMBULATORY_CARE_PROVIDER_SITE_OTHER): Payer: Medicare Other | Admitting: General Surgery

## 2017-07-19 VITALS — BP 138/82 | HR 73 | Resp 16 | Ht 61.0 in

## 2017-07-19 DIAGNOSIS — M7989 Other specified soft tissue disorders: Secondary | ICD-10-CM

## 2017-07-19 DIAGNOSIS — K603 Anal fistula: Secondary | ICD-10-CM

## 2017-07-19 NOTE — Progress Notes (Signed)
Patient ID: Maureen Morales, female   DOB: 1922-10-05, 84 y.o.   MRN: [id number]  Chief Complaint  Patient presents with  . Follow-up    HPI Maureen Morales is a 84 y.o. female.  Here for 3 week follow up anal fissure. She states she is doing fine. No bleeding, bowels move daily. She does have some trouble with pain her right leg, ultrasound planned for tomorrow. She states both feet are swollen.  She states she has been wheezing for about a month, on and off. She is here with her daughter,Tina.   HPI  Past Medical History:  Diagnosis Date  . Asthma without status asthmaticus    unspecified  . CAD (coronary artery disease)   . Colitis    unspecified  . Complication of anesthesia    sensitive to anesthesia  . Depression   . Diabetes mellitus   . Diabetes mellitus type 2, uncomplicated ([place])   . Essential hypertension   . GERD (gastroesophageal reflux disease)   . 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 HLD (hyperlipidemia)   . Hyperlipidemia    unspecified  . Hypertension   . Myocardial infarction ([place]) 2018  . Peripheral vascular disease ([place])   . Positive H. pylori test   . Viral gastroenteritis due to [place]-like agent Nov 2012    Past Surgical History:  Procedure Laterality Date  . ABDOMINAL HYSTERECTOMY    . BLADDER SURGERY    . CARDIAC CATHETERIZATION    . CHOLECYSTECTOMY    . COLONOSCOPY     06/16/1987, 02/20/1995, 05/04/1999, 02/14/2005, 05/17/2007  . ESOPHAGOGASTRODUODENOSCOPY     05/11/1987, 06/13/1995, 10/09/1995, 05/04/1999, 01/16/2002  . FLEXIBLE SIGMOIDOSCOPY N/A 05/02/2017   Procedure: FLEXIBLE SIGMOIDOSCOPY;  Surgeon: Manya Silvas, MD;  Location: [facility] ENDOSCOPY;  Service: Endoscopy;  Laterality: N/A;  . HEMORRHOID SURGERY N/A 05/14/2017   Procedure: EXTERNAL THROMBOSED HEMORRHOIDECTOMY;  Surgeon: Robert Bellow, MD;  Location: [facility] ORS;  Service: General;  Laterality: N/A;  . HIP ARTHROPLASTY Right 02/17/2017   Procedure: ARTHROPLASTY  BIPOLAR HIP (HEMIARTHROPLASTY);  Surgeon: Claud Kelp, MD;  Location: [facility] ORS;  Service: Orthopedics;  Laterality: Right;  . LEFT HEART CATH AND CORONARY ANGIOGRAPHY N/A 08/31/2016   Procedure: Left Heart Cath and Coronary Angiography;  Surgeon: Wellington Hampshire, MD;  Location: [place] CV LAB;  Service: Cardiovascular;  Laterality: N/A;  . ORIF PERIPROSTHETIC FRACTURE Right 05/27/2017  . ORIF PERIPROSTHETIC FRACTURE Right 05/27/2017   Procedure: OPEN REDUCTION INTERNAL FIXATION (ORIF) PERIPROSTHETIC FRACTURE;  Surgeon: Mcarthur Rossetti, MD;  Location: [place];  Service: Orthopedics;  Laterality: Right;  . TOTAL HIP ARTHROPLASTY Right   . uterian prolapse      Family History  Problem Relation Age of Onset  . Breast cancer Mother 87  . Colon cancer Father   . Stroke Sister   . Cancer Mother   . Breast cancer Sister 77  . Colon cancer Brother   . Colon cancer Brother     Social History Social History   Tobacco Use  . Smoking status: Never Smoker  . Smokeless tobacco: Never Used  Substance Use Topics  . Alcohol use: No    Frequency: Never  . Drug use: No    Allergies  Allergen Reactions  . Asa [Aspirin] Anaphylaxis  . Aspirin Swelling  . Atropine Other (See Comments)    Reaction: unknown  . Belladonna Alkaloids Other (See Comments)    Reaction: unknown  . Codeine Other (See  Comments)    Reaction: unknown  . Darvon Other (See Comments)    Reaction: unknown  . Darvon [Propoxyphene] Nausea And Vomiting  . Demerol [Meperidine] Nausea And Vomiting  . Ferrous Sulfate Diarrhea  . Meperidine And Related   . Methocarbamol Other (See Comments)    Reaction: unknown  . Penicillins Other (See Comments)    Reaction: unknown Has patient had a PCN reaction causing immediate rash, facial/tongue/throat swelling, SOB or lightheadedness with hypotension: Unknown Has patient had a PCN reaction causing severe rash involving mucus membranes or skin necrosis: Unknown Has  patient had a PCN reaction that required hospitalization: Unknown Has patient had a PCN reaction occurring within the last 10 years: Unknown If all of the above answers are "NO", then may proceed with Cephalosporin use.   [name] Penicillins Other (See Comments)    Pt and family unsure of details  . Sulfa Antibiotics Other (See Comments)    Reaction: unknown  . Iron Other (See Comments)    High dose prescription gives her diarrhea    Current Outpatient Medications  Medication Sig Dispense Refill  . atorvastatin (LIPITOR) 40 MG tablet Take 40 mg by mouth daily.    . fenofibrate 160 MG tablet Take 160 mg by mouth daily.    [name] glipiZIDE (GLUCOTROL) 5 MG tablet Take 0.5 tablets (2.5 mg total) by mouth daily before breakfast. (Patient taking differently: Take 2.5 mg by mouth every evening. ) 90 tablet 1  . glucose blood (BAYER CONTOUR TEST) test strip Use as instructed 100 each 5  . hydrALAZINE (APRESOLINE) 50 MG tablet Take 1 tablet (50 mg total) by mouth every 8 (eight) hours. 90 tablet 2  . loratadine (CLARITIN) 10 MG tablet Take 10 mg daily by mouth.     . losartan (COZAAR) 100 MG tablet Take 100 mg by mouth daily.    . metoprolol succinate (TOPROL-XL) 50 MG 24 hr tablet Take 1 1/2 pills with or immediately following a meal. 45 tablet 11  . nitroGLYCERIN (NITROSTAT) 0.4 MG SL tablet Place 1 tablet (0.4 mg total) under the tongue every 5 (five) minutes as needed for chest pain. 10 tablet 0  . sertraline (ZOLOFT) 50 MG tablet Take 1 tablet (50 mg total) by mouth daily. 30 tablet 0  . vitamin C (ASCORBIC ACID) 500 MG tablet Take 1 tablet (500 mg total) by mouth 2 (two) times daily. 60 tablet 2  . Vitamin D, Ergocalciferol, (DRISDOL) 50000 units CAPS capsule Take 1 capsule (50,000 Units total) by mouth every 7 (seven) days. 12 capsule 0  . clindamycin (CLEOCIN) 300 MG capsule 2 capsules one hour before dental procedure (Patient not taking: Reported on 07/19/2017) 2 capsule 2  . polycarbophil (FIBERCON)  625 MG tablet Take 2 tablets (1,250 mg total) by mouth 2 (two) times daily. (Patient not taking: Reported on 07/19/2017) 60 tablet 0   No current facility-administered medications for this visit.     Review of Systems Review of Systems  Constitutional: Negative.   Respiratory: Positive for wheezing.   Cardiovascular: Negative.     Blood pressure 138/82, pulse 73, resp. rate 16, height 5\' 1"  (1.549 m), SpO2 98 %.  Physical Exam Physical Exam  Constitutional: She is oriented to person, place, and time. She appears well-developed and well-nourished.  Cardiovascular:  Bilateral lower extremity edema  Genitourinary:     Genitourinary Comments: Healing anal incision, clean  Musculoskeletal:       Legs: Neurological: She is alert and oriented to person, place, and  time.  Skin: Skin is warm and dry.  Psychiatric: Her behavior is normal.     Assessment    Resolution of incontinence and perianal pain status post fistulotomy.  Persistent/progressive lower extremity edema, especially on the left, nonaffected side.    Plan    Plan to order left venous ultrasound in addition to the previously scheduled right lower extremity ultrasound.  To schedule this, it will be necessary to cancel the previously ordered right lower extremity ultrasound and added a bilateral lower extremity ultrasound.  Will be sure to forward results of that study the original rehabilitation physician for his review.  Importance of keeping her legs elevated above the level of the pelvis when at rest was emphasized.  Follow up as needed.      HPI, Physical Exam, Assessment and Plan have been scribed under the direction and in the presence of Robert Bellow, MD. Karie Fetch, RN  I have completed the exam and reviewed the above documentation for accuracy and completeness.  I agree with the above.  Haematologist has been used and any errors in dictation or transcription are unintentional.  Hervey Ard, M.D., F.A.C.S.  Forest Gleason Orestes Geiman 07/19/2017, 12:16 PM

## 2017-07-19 NOTE — Patient Instructions (Signed)
The patient is aware to call back for any questions or concerns.  

## 2017-07-20 ENCOUNTER — Ambulatory Visit
Admission: RE | Admit: 2017-07-20 | Discharge: 2017-07-20 | Disposition: A | Discharge status: Home / Self Care | Payer: Medicare Other | Source: Ambulatory Visit | Attending: Physical Medicine & Rehabilitation | Admitting: Physical Medicine & Rehabilitation

## 2017-07-20 DIAGNOSIS — Z96641 Presence of right artificial hip joint: Secondary | ICD-10-CM | POA: Diagnosis not present

## 2017-07-20 DIAGNOSIS — R600 Localized edema: Secondary | ICD-10-CM | POA: Diagnosis not present

## 2017-07-20 DIAGNOSIS — M9701XD Periprosthetic fracture around internal prosthetic right hip joint, subsequent encounter: Secondary | ICD-10-CM | POA: Diagnosis not present

## 2017-07-20 DIAGNOSIS — I252 Old myocardial infarction: Secondary | ICD-10-CM | POA: Diagnosis not present

## 2017-07-20 DIAGNOSIS — I2510 Atherosclerotic heart disease of native coronary artery without angina pectoris: Secondary | ICD-10-CM | POA: Diagnosis not present

## 2017-07-20 DIAGNOSIS — M7989 Other specified soft tissue disorders: Secondary | ICD-10-CM | POA: Diagnosis not present

## 2017-07-20 DIAGNOSIS — I130 Hypertensive heart and chronic kidney disease with heart failure and stage 1 through stage 4 chronic kidney disease, or unspecified chronic kidney disease: Secondary | ICD-10-CM | POA: Diagnosis not present

## 2017-07-20 DIAGNOSIS — E1151 Type 2 diabetes mellitus with diabetic peripheral angiopathy without gangrene: Secondary | ICD-10-CM | POA: Diagnosis not present

## 2017-07-23 DIAGNOSIS — Z96641 Presence of right artificial hip joint: Secondary | ICD-10-CM | POA: Diagnosis not present

## 2017-07-23 DIAGNOSIS — E1151 Type 2 diabetes mellitus with diabetic peripheral angiopathy without gangrene: Secondary | ICD-10-CM | POA: Diagnosis not present

## 2017-07-23 DIAGNOSIS — I252 Old myocardial infarction: Secondary | ICD-10-CM | POA: Diagnosis not present

## 2017-07-23 DIAGNOSIS — I130 Hypertensive heart and chronic kidney disease with heart failure and stage 1 through stage 4 chronic kidney disease, or unspecified chronic kidney disease: Secondary | ICD-10-CM | POA: Diagnosis not present

## 2017-07-23 DIAGNOSIS — I2510 Atherosclerotic heart disease of native coronary artery without angina pectoris: Secondary | ICD-10-CM | POA: Diagnosis not present

## 2017-07-23 DIAGNOSIS — M9701XD Periprosthetic fracture around internal prosthetic right hip joint, subsequent encounter: Secondary | ICD-10-CM | POA: Diagnosis not present

## 2017-07-27 DIAGNOSIS — Z96641 Presence of right artificial hip joint: Secondary | ICD-10-CM | POA: Diagnosis not present

## 2017-07-27 DIAGNOSIS — I130 Hypertensive heart and chronic kidney disease with heart failure and stage 1 through stage 4 chronic kidney disease, or unspecified chronic kidney disease: Secondary | ICD-10-CM | POA: Diagnosis not present

## 2017-07-27 DIAGNOSIS — I2510 Atherosclerotic heart disease of native coronary artery without angina pectoris: Secondary | ICD-10-CM | POA: Diagnosis not present

## 2017-07-27 DIAGNOSIS — E1151 Type 2 diabetes mellitus with diabetic peripheral angiopathy without gangrene: Secondary | ICD-10-CM | POA: Diagnosis not present

## 2017-07-27 DIAGNOSIS — M9701XD Periprosthetic fracture around internal prosthetic right hip joint, subsequent encounter: Secondary | ICD-10-CM | POA: Diagnosis not present

## 2017-07-27 DIAGNOSIS — I252 Old myocardial infarction: Secondary | ICD-10-CM | POA: Diagnosis not present

## 2017-07-30 ENCOUNTER — Other Ambulatory Visit: Payer: Self-pay | Admitting: Internal Medicine

## 2017-07-30 NOTE — Progress Notes (Unsigned)
Lab Results  Component Value Date   HGBA1C 7.0 (H) 05/28/2017   lastcbc Lab Results  Component Value Date   WBC 8.4 06/08/2017   HGB 9.7 (L) 06/08/2017   HCT 30.5 (L) 06/08/2017   MCV 87.6 06/08/2017   PLT 468 (H) 06/08/2017

## 2017-08-01 ENCOUNTER — Telehealth: Payer: Self-pay

## 2017-08-01 ENCOUNTER — Other Ambulatory Visit: Payer: Self-pay | Admitting: Internal Medicine

## 2017-08-01 ENCOUNTER — Encounter: Payer: Self-pay | Admitting: Internal Medicine

## 2017-08-01 DIAGNOSIS — L89613 Pressure ulcer of right heel, stage 3: Secondary | ICD-10-CM

## 2017-08-01 NOTE — Telephone Encounter (Signed)
She needs to be evaluated by myself or her PCP.  Please schedule for appointment.  Thanks.

## 2017-08-01 NOTE — Telephone Encounter (Signed)
Called about a bedsore on foot that has an odor and raw spot, please advice

## 2017-08-01 NOTE — Telephone Encounter (Signed)
Contacted patients daughter and advised her to make an appointment with either Dr. Posey Pronto or PCP.

## 2017-08-02 DIAGNOSIS — E1151 Type 2 diabetes mellitus with diabetic peripheral angiopathy without gangrene: Secondary | ICD-10-CM | POA: Diagnosis not present

## 2017-08-02 DIAGNOSIS — Z96641 Presence of right artificial hip joint: Secondary | ICD-10-CM | POA: Diagnosis not present

## 2017-08-02 DIAGNOSIS — M9701XD Periprosthetic fracture around internal prosthetic right hip joint, subsequent encounter: Secondary | ICD-10-CM | POA: Diagnosis not present

## 2017-08-02 DIAGNOSIS — I2510 Atherosclerotic heart disease of native coronary artery without angina pectoris: Secondary | ICD-10-CM | POA: Diagnosis not present

## 2017-08-02 DIAGNOSIS — I252 Old myocardial infarction: Secondary | ICD-10-CM | POA: Diagnosis not present

## 2017-08-02 DIAGNOSIS — I130 Hypertensive heart and chronic kidney disease with heart failure and stage 1 through stage 4 chronic kidney disease, or unspecified chronic kidney disease: Secondary | ICD-10-CM | POA: Diagnosis not present

## 2017-08-06 ENCOUNTER — Encounter: Payer: Medicare Other | Attending: Physician Assistant | Admitting: Physician Assistant

## 2017-08-06 ENCOUNTER — Encounter (INDEPENDENT_AMBULATORY_CARE_PROVIDER_SITE_OTHER): Payer: Self-pay | Admitting: Orthopaedic Surgery

## 2017-08-06 ENCOUNTER — Ambulatory Visit (INDEPENDENT_AMBULATORY_CARE_PROVIDER_SITE_OTHER): Payer: Medicare Other

## 2017-08-06 ENCOUNTER — Ambulatory Visit (INDEPENDENT_AMBULATORY_CARE_PROVIDER_SITE_OTHER): Payer: Medicare Other | Admitting: Orthopaedic Surgery

## 2017-08-06 DIAGNOSIS — M9701XD Periprosthetic fracture around internal prosthetic right hip joint, subsequent encounter: Secondary | ICD-10-CM

## 2017-08-06 DIAGNOSIS — E1151 Type 2 diabetes mellitus with diabetic peripheral angiopathy without gangrene: Secondary | ICD-10-CM | POA: Diagnosis not present

## 2017-08-06 DIAGNOSIS — Z8249 Family history of ischemic heart disease and other diseases of the circulatory system: Secondary | ICD-10-CM | POA: Diagnosis not present

## 2017-08-06 DIAGNOSIS — M1990 Unspecified osteoarthritis, unspecified site: Secondary | ICD-10-CM | POA: Diagnosis not present

## 2017-08-06 DIAGNOSIS — J449 Chronic obstructive pulmonary disease, unspecified: Secondary | ICD-10-CM | POA: Insufficient documentation

## 2017-08-06 DIAGNOSIS — E1140 Type 2 diabetes mellitus with diabetic neuropathy, unspecified: Secondary | ICD-10-CM | POA: Diagnosis not present

## 2017-08-06 DIAGNOSIS — I2510 Atherosclerotic heart disease of native coronary artery without angina pectoris: Secondary | ICD-10-CM | POA: Diagnosis not present

## 2017-08-06 DIAGNOSIS — L89613 Pressure ulcer of right heel, stage 3: Secondary | ICD-10-CM | POA: Diagnosis not present

## 2017-08-06 DIAGNOSIS — I10 Essential (primary) hypertension: Secondary | ICD-10-CM | POA: Insufficient documentation

## 2017-08-06 DIAGNOSIS — E11621 Type 2 diabetes mellitus with foot ulcer: Secondary | ICD-10-CM | POA: Diagnosis not present

## 2017-08-06 DIAGNOSIS — Z96641 Presence of right artificial hip joint: Secondary | ICD-10-CM | POA: Insufficient documentation

## 2017-08-06 DIAGNOSIS — K219 Gastro-esophageal reflux disease without esophagitis: Secondary | ICD-10-CM | POA: Insufficient documentation

## 2017-08-06 DIAGNOSIS — I252 Old myocardial infarction: Secondary | ICD-10-CM | POA: Insufficient documentation

## 2017-08-06 DIAGNOSIS — L97412 Non-pressure chronic ulcer of right heel and midfoot with fat layer exposed: Secondary | ICD-10-CM | POA: Diagnosis not present

## 2017-08-06 NOTE — Progress Notes (Signed)
The patient is in postop follow-up status post open reduction internal fixation of a right periprosthetic femur fracture around a right hip hemiarthroplasty.  She is mainly been in a wheelchair with only touchdown weightbearing.  She is now about 10 weeks out from this surgery.  She denies any hip or thigh pain.  On examination wheelchair I can put her hip through internal and external rotation as well as flexion-extension with minimal discomfort if at all.  She can perform a straight leg raise on her own.  X-rays of the right hip and femur show intact plate screws and cables from her periprosthetic fracture with interval healing.  At this point I am going to advance her to full weightbearing as tolerated with getting up with therapy to work on strengthening as well as balance and coordination and using only ever an assistive device.  We will see her back for follow-up visit in 4 weeks.  I would like 2 views of her right femur at that visit.  All questions concerns were answered and addressed.

## 2017-08-07 NOTE — Progress Notes (Signed)
Maureen Morales, Maureen Morales ([id number]) Visit Report for 08/06/2017 Abuse/Suicide Risk Screen Details Patient Name: Maureen Morales, Maureen Morales. Date of Service: 08/06/2017 8:00 AM Medical Record Number: [id number] Patient Account Number: [id number] Date of Birth/Sex: 1923-04-21 (84 y.o. Female) Treating RN: Montey Hora Primary Care Michelyn Scullin: Deborra Medina Other Clinician: Referring Macallister Ashmead: Deborra Medina Treating Shahara Hartsfield/Extender: Melburn Hake, HOYT Weeks in Treatment: 0 Abuse/Suicide Risk Screen Items Answer ABUSE/SUICIDE RISK SCREEN: Has anyone close to you tried to hurt or harm you recentlyo No Do you feel uncomfortable with anyone in your familyo No Has anyone forced you do things that you didnot want to doo No Do you have any thoughts of harming yourselfo No Patient displays signs or symptoms of abuse and/or neglect. No Electronic Signature(s) Signed: 08/06/2017 4:12:39 PM By: Montey Hora Entered By: Montey Hora on 08/06/2017 08:17:10 Maureen Morales ([id number]) -------------------------------------------------------------------------------- Activities of Daily Living Details Patient Name: Maureen Morales, Maureen Morales. Date of Service: 08/06/2017 8:00 AM Medical Record Number: [id number] Patient Account Number: [id number] Date of Birth/Sex: 09-Feb-1923 (84 y.o. Female) Treating RN: Montey Hora Primary Care Myrtle Barnhard: Deborra Medina Other Clinician: Referring Mckenzye Cutright: Deborra Medina Treating Allina Riches/Extender: Melburn Hake, HOYT Weeks in Treatment: 0 Activities of Daily Living Items Answer Activities of Daily Living (Please select one for each item) Drive Automobile Not Able Take Medications Need Assistance Use Telephone Need Assistance Care for Appearance Completely Able Use Toilet Completely Able Bath / Shower Need Assistance Dress Self Need Assistance Feed Self Completely Able Walk Need Assistance Get In / Out Bed Need Assistance Housework Need Assistance Prepare Meals Need Assistance Handle Money Need  Assistance Shop for Self Need Assistance Electronic Signature(s) Signed: 08/06/2017 4:12:39 PM By: Montey Hora Entered By: Montey Hora on 08/06/2017 08:17:45 Maureen Morales ([id number]) -------------------------------------------------------------------------------- Education Assessment Details Patient Name: Maureen Morales. Date of Service: 08/06/2017 8:00 AM Medical Record Number: [id number] Patient Account Number: [id number] Date of Birth/Sex: April 12, 1923 (84 y.o. Female) Treating RN: Montey Hora Primary Care Nancy Arvin: Deborra Medina Other Clinician: Referring Sherise Geerdes: Deborra Medina Treating Chelsie Burel/Extender: Melburn Hake, HOYT Weeks in Treatment: 0 Primary Learner Assessed: Caregiver caregivers Reason Patient is not Primary Learner: wound location Learning Preferences/Education Level/Primary Language Learning Preference: Explanation, Demonstration Highest Education Level: High School Preferred Language: English Cognitive Barrier Assessment/Beliefs Language Barrier: No Translator Needed: No Memory Deficit: No Emotional Barrier: No Cultural/Religious Beliefs Affecting Medical Care: No Physical Barrier Assessment Impaired Vision: No Impaired Hearing: No Decreased Hand dexterity: No Knowledge/Comprehension Assessment Knowledge Level: Medium Comprehension Level: Medium Ability to understand written Medium instructions: Ability to understand verbal Medium instructions: Motivation Assessment Anxiety Level: Calm Cooperation: Cooperative Education Importance: Acknowledges Need Interest in Health Problems: Asks Questions Perception: Coherent Willingness to Engage in Self- Medium Management Activities: Readiness to Engage in Self- Medium Management Activities: Electronic Signature(s) Signed: 08/06/2017 4:12:39 PM By: Montey Hora Entered By: Montey Hora on 08/06/2017 08:18:22 Maureen Morales  ([id number]) -------------------------------------------------------------------------------- Fall Risk Assessment Details Patient Name: Maureen Morales. Date of Service: 08/06/2017 8:00 AM Medical Record Number: [id number] Patient Account Number: [id number] Date of Birth/Sex: 17-Oct-1922 (84 y.o. Female) Treating RN: Montey Hora Primary Care Jabar Krysiak: Deborra Medina Other Clinician: Referring Rainey Kahrs: Deborra Medina Treating Lasharon Dunivan/Extender: Melburn Hake, HOYT Weeks in Treatment: 0 Fall Risk Assessment Items Have you had 2 or more falls in the last 12 monthso 0 Yes Have you had any fall that resulted in injury in the last 12 monthso 0 No FALL RISK ASSESSMENT: History of falling - immediate or within 3 months 0 No Secondary diagnosis 0 No Ambulatory aid  None/bed rest/wheelchair/nurse 0 Yes Crutches/cane/walker 0 No Furniture 0 No IV Access/Saline Lock 0 No Gait/Training Normal/bed rest/immobile 0 No Weak 10 Yes Impaired 0 No Mental Status Oriented to own ability 0 Yes Electronic Signature(s) Signed: 08/06/2017 4:12:39 PM By: Montey Hora Entered By: Montey Hora on 08/06/2017 08:19:08 Maureen Morales ([id number]) -------------------------------------------------------------------------------- Foot Assessment Details Patient Name: Maureen Morales. Date of Service: 08/06/2017 8:00 AM Medical Record Number: [id number] Patient Account Number: [id number] Date of Birth/Sex: Nov 04, 1922 (84 y.o. Female) Treating RN: Montey Hora Primary Care Laylah Riga: Deborra Medina Other Clinician: Referring Avanni Turnbaugh: Deborra Medina Treating Lashunta Frieden/Extender: Melburn Hake, HOYT Weeks in Treatment: 0 Foot Assessment Items Site Locations + = Sensation present, - = Sensation absent, C = Callus, U = Ulcer R = Redness, W = Warmth, M = Maceration, PU = Pre-ulcerative lesion F = Fissure, S = Swelling, D = Dryness Assessment Right: Left: Other Deformity: No No Prior Foot Ulcer: No No Prior Amputation: No  No Charcot Joint: No No Ambulatory Status: Non-ambulatory Assistance Device: Wheelchair Gait: Steady Electronic Signature(s) Signed: 08/06/2017 4:12:39 PM By: Montey Hora Entered By: Montey Hora on 08/06/2017 08:19:34 Maureen Morales ([id number]) -------------------------------------------------------------------------------- Nutrition Risk Assessment Details Patient Name: Maureen Morales. Date of Service: 08/06/2017 8:00 AM Medical Record Number: [id number] Patient Account Number: [id number] Date of Birth/Sex: [birth year] (84 y.o. Female) Treating RN: Montey Hora Primary Care Iridessa Harrow: Deborra Medina Other Clinician: Referring Bladen Umar: Deborra Medina Treating Amit Meloy/Extender: Melburn Hake, HOYT Weeks in Treatment: 0 Height (in): 61 Weight (lbs): 124 Body Mass Index (BMI): 23.4 Nutrition Risk Assessment Items NUTRITION RISK SCREEN: I have an illness or condition that made me change the kind and/or amount of 0 No food I eat I eat fewer than two meals per day 0 No I eat few fruits and vegetables, or milk products 0 No I have three or more drinks of beer, liquor or wine almost every day 0 No I have tooth or mouth problems that make it hard for me to eat 0 No I don't always have enough money to buy the food I need 0 No I eat alone most of the time 0 No I take three or more different prescribed or over-the-counter drugs a day 1 Yes Without wanting to, I have lost or gained 10 pounds in the last six months 0 No I am not always physically able to shop, cook and/or feed myself 0 No Nutrition Protocols Good Risk Protocol 0 No interventions needed Moderate Risk Protocol Electronic Signature(s) Signed: 08/06/2017 4:12:39 PM By: Montey Hora Entered By: Montey Hora on 08/06/2017 08:19:14

## 2017-08-07 NOTE — Progress Notes (Signed)
Maureen Morales, Maureen Morales ([id number]) Visit Report for 08/06/2017 Chief Complaint Document Details Patient Name: Maureen Morales, Maureen Morales. Date of Service: 08/06/2017 8:00 AM Medical Record Number: [id number] Patient Account Number: [id number] Date of Birth/Sex: [birth year] (84 y.o. Female) Treating RN: Roger Shelter Primary Care Provider: Deborra Medina Other Clinician: Referring Provider: Deborra Medina Treating Provider/Extender: Melburn Hake,  Weeks in Treatment: 0 Information Obtained from: Patient Chief Complaint Right heel ulcer Electronic Signature(s) Signed: 08/06/2017 9:04:27 AM By: Worthy Keeler PA-C Entered By: Worthy Keeler on 08/06/2017 08:58:29 Maureen Morales ([id number]) -------------------------------------------------------------------------------- HPI Details Patient Name: Maureen Morales Date of Service: 08/06/2017 8:00 AM Medical Record Number: [id number] Patient Account Number: [id number] Date of Birth/Sex: Maureen Morales[birth year] (84 y.o. 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 This developed when she was in the hospital over a period of 3-4 days at the end of January for a right hip replacement. She unfortunately had fallen and fractured her hip. With that being said the patient seems to have done very well in that regard and is showing signs of getting better as far as her therapy is concerned. Unfortunately the heel ulcer which she developed while she was in the hospital seems to have continued to give her trouble and she did have eschar  still covering when she arrived today. With that being said this was subsequently removed today just during cleansing of the heel prior to me entering the room popped right off. Underline there appears to be a very well healing area of ulceration which is very superficial and scattered but there does not appear to be any evidence of infection which is good news. She has good epithelialization. No fevers, chills, nausea, or vomiting noted at this time. Patient does have discomfort she is currently undergoing physical therapy though they are doing partial weight bearing only utilizing the walker I think that is appropriate. I do not want her fully weight-bearing on this area yet. She does have swelling of the bilateral lower extremities this seems to be secondary to the hydralazine which has been increased recently according to her doctor. Electronic Signature(s) Signed: 08/06/2017 9:04:27 AM By: Worthy Keeler PA-C Entered By: Worthy Keeler on 08/06/2017 08:59:51 Maureen Morales ([id number]) -------------------------------------------------------------------------------- Physical Exam Details Patient Name: Maureen Morales Date of Service: 08/06/2017 8:00 AM Medical Record Number: [id number] Patient Account Number: [id number] Date of Birth/Sex: [birth year] Maureen Morales (84 y.o. Female) Treating RN: Roger Shelter Primary Care Provider: Deborra Medina Other Clinician: Referring Provider: Deborra Medina Treating Provider/Extender: Melburn Hake,  Weeks in Treatment: 0 Constitutional patient is hypertensive.. Well-nourished and well-hydrated in no acute distress. Eyes conjunctiva clear no eyelid edema noted. pupils equal round and reactive to light and accommodation. Ears, Nose, Mouth, and Throat no gross abnormality of ear auricles or external auditory canals. normal hearing noted during conversation. mucus membranes moist. Respiratory normal breathing without difficulty. clear to auscultation  bilaterally. Cardiovascular regular rate and rhythm with normal S1, S2 with a systolic ejection murmur. 1+ dorsalis pedis/posterior tibialis pulses. 1+ pitting edema of the bilateral lower extremities. Gastrointestinal (GI) soft, non-tender, non-distended, +BS. no ventral hernia noted. Musculoskeletal Patient unable to walk without assistance.  no significant deformity or arthritic changes, no loss or range of motion, no clubbing. Psychiatric this patient is able to make decisions and demonstrates good insight into disease process. Alert and Oriented x 3. pleasant and cooperative. Notes Currently patient's wound again in regard to the eschar is better as this did completely Morales off today just with cleansing prior to my initial evaluation. Fortunately everything that I see seems to be just small scattered areas of breakdown with good epithelialization there is fat layer exposed. Otherwise there does not appear to be the incidence of infection which is good news. Patient does have tenderness over the heel I do think we still need to protect this hopefully the Boarder Foam Dressing will do so. Electronic Signature(s) Signed: 08/06/2017 9:04:27 AM By: Worthy Keeler PA-C Entered By: Worthy Keeler on 08/06/2017 09:01:50 Maureen Morales ([id number]) -------------------------------------------------------------------------------- Physician Orders Details Patient Name: Maureen Morales Date of Service: 08/06/2017 8:00 AM Medical Record Number: [id number] Patient Account Number: [id number] Date of Birth/Sex: [birth year] (84 y.o. Female) Treating RN: Roger Shelter Primary Care Provider: Deborra Medina Other Clinician: Referring Provider: Deborra Medina Treating Provider/Extender: Melburn Hake,  Weeks in Treatment: 0 Verbal / Phone Orders: No Diagnosis Coding Wound Cleansing Wound #1 Right Calcaneus o Clean wound with Normal Saline. Anesthetic (add to Medication List) Wound #1 Right  Calcaneus o Topical Lidocaine 4% cream applied to wound bed prior to debridement (In Clinic Only). Primary Wound Dressing Wound #1 Right Calcaneus o Silver Collagen Secondary Dressing Wound #1 Right Calcaneus o Boardered Foam Dressing Dressing Change Frequency Wound #1 Right Calcaneus o Change dressing every other day. Follow-up Appointments Wound #1 Right Calcaneus o Return Appointment in 1 week. Electronic Signature(s) Signed: 08/06/2017 9:04:27 AM By: Worthy Keeler PA-C Signed: 08/06/2017 2:19:56 PM By: Roger Shelter Entered By: Roger Shelter on 08/06/2017 08:47:Morales Maureen Morales ([id number]) -------------------------------------------------------------------------------- Problem List Details Patient Name: Maureen Morales. Date of Service: 08/06/2017 8:00 AM Medical Record Number: [id number] Patient Account Number: [id number] Date of Birth/Sex: [birth year]Maureen Morales (84 y.o. Female) Treating RN: Roger Shelter Primary Care Provider: Deborra Medina Other Clinician: Referring Provider: Deborra Medina Treating Provider/Extender: Melburn Hake,  Weeks in Treatment: 0 Active Problems ICD-10 Impacting Encounter Code Description Active Date Wound Healing Diagnosis L89.613 Pressure ulcer of right heel, stage 3 08/06/2017 Yes E11.621 Type 2 diabetes mellitus with foot ulcer 08/06/2017 Yes I10 Essential (primary) hypertension 08/06/2017 Yes Z96.641 Presence of right artificial hip joint 08/06/2017 Yes Inactive Problems Resolved Problems Electronic Signature(s) Signed: 08/06/2017 9:04:27 AM By: Worthy Keeler PA-C Entered By: Worthy Keeler on 08/06/2017 08:56:49 Maureen Morales ([id number]) -------------------------------------------------------------------------------- Progress Note Details Patient Name: Maureen Morales. Date of Service: 08/06/2017 8:00 AM Medical Record Number: [id number] Patient Account Number: [id number] Date of Birth/Sex: Maureen Morales, Maureen Morales (84 y.o. 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 This developed when she was in the hospital over a period of 3-4 days at the end of January for a right hip replacement. She unfortunately had fallen and fractured her hip. With that  being said the patient seems to have done very well in that regard and is showing signs of getting better as far as her therapy is concerned. Unfortunately the heel ulcer which she developed while she was in the hospital seems to have continued to give her trouble and she did have eschar still covering when she arrived today. With that being said this was subsequently removed today just during cleansing of the heel prior to me entering the room popped right off. Underline there appears to be a very well healing area of ulceration which is very superficial and scattered but there does not appear to be any evidence of infection which is good news. She has good epithelialization. No fevers, chills, nausea, or vomiting noted at this time. Patient does have discomfort she is currently undergoing physical therapy though they are doing partial weight bearing only utilizing the walker I think that is appropriate. I do not want her fully weight-bearing on this area yet. She does have swelling of the bilateral lower extremities this seems to be secondary to the hydralazine which has been increased recently according to her  doctor. Wound History Patient presents with 1 open wound that has been present for approximately 2 1/2 months. Patient has been treating wound in the following manner: foam bandage. Laboratory tests have not been performed in the last month. Patient reportedly has not tested positive for an antibiotic resistant organism. Patient reportedly has not tested positive for osteomyelitis. Patient reportedly has not had testing performed to evaluate circulation in the legs. Patient History Information obtained from Caregiver. Allergies aspirin, atropine, belladonna alkaloids, codeine, Darvon, methocarbamol, penicillin, Sulfa (Sulfonamide Antibiotics), Fer-Iron Family History Cancer - Siblings,Mother, Heart Disease - Father, Hypertension - Father, No family history of Diabetes, Hereditary Spherocytosis, Kidney Disease, Lung Disease, Seizures, Stroke, Thyroid Problems, Tuberculosis. Social History Never smoker, Marital Status - Married, Alcohol Use - Never, Drug Use - No History, Caffeine Use - Daily. Medical History Respiratory Maureen Morales, Maureen Morales ([id number]) Patient has history of Asthma Denies history of Aspiration, Chronic Obstructive Pulmonary Disease (COPD), Pneumothorax, Sleep Apnea, Tuberculosis Cardiovascular Patient has history of Coronary Artery Disease, Hypertension, Myocardial Infarction - 2018, Peripheral Venous Disease Denies history of Angina, Arrhythmia, Congestive Heart Failure, Deep Vein Thrombosis, Hypotension, Peripheral Arterial Disease, Phlebitis, Vasculitis Gastrointestinal Patient has history of Colitis Denies history of Cirrhosis , Crohn s, Hepatitis A, Hepatitis B, Hepatitis C Endocrine Patient has history of Type II Diabetes - 7.0 A1C Immunological Denies history of Lupus Erythematosus, Raynaud s, Scleroderma Musculoskeletal Patient has history of Osteoarthritis Denies history of Gout, Rheumatoid Arthritis, Osteomyelitis Neurologic Patient has history of  Neuropathy Denies history of Dementia Oncologic Denies history of Received Chemotherapy, Received Radiation Patient is treated with Oral Agents. Medical And Surgical History Notes Ear/Nose/Mouth/Throat HOH Gastrointestinal GERD Review of Systems (ROS) Constitutional Symptoms (General Health) The patient has no complaints or symptoms. Eyes The patient has no complaints or symptoms. Ear/Nose/Mouth/Throat The patient has no complaints or symptoms. Hematologic/Lymphatic The patient has no complaints or symptoms. Gastrointestinal The patient has no complaints or symptoms. Genitourinary The patient has no complaints or symptoms. Immunological The patient has no complaints or symptoms. Integumentary (Skin) The patient has no complaints or symptoms. Musculoskeletal The patient has no complaints or symptoms. Neurologic The patient has no complaints or symptoms. Oncologic The patient has no complaints or symptoms. Maureen Morales, Maureen Morales ([id number]) Objective Constitutional patient is hypertensive.. Well-nourished and well-hydrated in no acute distress. Vitals Time Taken: 8:16 AM, Height: 61 in, Source: Measured, Weight: 124  lbs, Source: Measured, BMI: 23.4, Temperature: 98.1 F, Pulse: 75 bpm, Respiratory Rate: 16 breaths/min, Blood Pressure: 176/66 mmHg. Eyes conjunctiva clear no eyelid edema noted. pupils equal round and reactive to light and accommodation. Ears, Nose, Mouth, and Throat no gross abnormality of ear auricles or external auditory canals. normal hearing noted during conversation. mucus membranes moist. Respiratory normal breathing without difficulty. clear to auscultation bilaterally. Cardiovascular regular rate and rhythm with normal S1, S2 with a systolic ejection murmur. 1+ dorsalis pedis/posterior tibialis pulses. 1+ pitting edema of the bilateral lower extremities. Gastrointestinal (GI) soft, non-tender, non-distended, +BS. no ventral hernia  noted. Musculoskeletal Patient unable to walk without assistance. no significant deformity or arthritic changes, no loss or range of motion, no clubbing. Psychiatric this patient is able to make decisions and demonstrates good insight into disease process. Alert and Oriented x 3. pleasant and cooperative. General Notes: Currently patient's wound again in regard to the eschar is better as this did completely Morales off today just with cleansing prior to my initial evaluation. Fortunately everything that I see seems to be just small scattered areas of breakdown with good epithelialization there is fat layer exposed. Otherwise there does not appear to be the incidence of infection which is good news. Patient does have tenderness over the heel I do think we still need to protect this hopefully the Boarder Foam Dressing will do so. Integumentary (Hair, Skin) Wound #1 status is Open. Original cause of wound was Pressure Injury. The wound is located on the Right Calcaneus. The wound measures 4cm length x 3cm width x 0.1cm depth; 9.425cm^2 area and 0.942cm^3 volume. There is no tunneling or undermining noted. There is a large amount of serous drainage noted. The wound margin is flat and intact. There is large (67- 100%) red granulation within the wound bed. There is a small (1-33%) amount of necrotic tissue within the wound bed including Adherent Slough. The periwound skin appearance did not exhibit: Callus, Crepitus, Excoriation, Induration, Rash, Scarring, Dry/Scaly, Maceration, Atrophie Blanche, Cyanosis, Ecchymosis, Hemosiderin Staining, Mottled, Pallor, Rubor, Erythema. Periwound temperature was noted as No Abnormality. The periwound has tenderness on palpation. Assessment Maureen Morales, Maureen Morales ([id number]) Active Problems ICD-10 [phone] - Pressure ulcer of right heel, stage 3 E11.621 - Type 2 diabetes mellitus with foot ulcer I10 - Essential (primary) hypertension Z96.641 - Presence of right  artificial hip joint Plan Wound Cleansing: Wound #1 Right Calcaneus: Clean wound with Normal Saline. Anesthetic (add to Medication List): Wound #1 Right Calcaneus: Topical Lidocaine 4% cream applied to wound bed prior to debridement (In Clinic Only). Primary Wound Dressing: Wound #1 Right Calcaneus: Silver Collagen Secondary Dressing: Wound #1 Right Calcaneus: Boardered Foam Dressing Dressing Change Frequency: Wound #1 Right Calcaneus: Change dressing every other day. Follow-up Appointments: Wound #1 Right Calcaneus: Return Appointment in 1 week. We are going to initiate the above wound care orders for the next week. Fortunately patient ABI is excellent and I think that she has already experienced a tremendous amount of healing will continue to do so. I'm gonna suggest the Prisma dressing per above and we will see were things stand in one weeks time. Patient and her daughter who was present during the evaluation today are both in agreement with this plan. I do think it's appropriate for her to continue with partial weight-bearing physical therapy she definitely needs therapy in regard to her hip and as long as she's just partial weight bearing with the walker I think she will be okay. If anything seems to  suggest otherwise when we see her for follow-up and we will make adjustments accordingly. They are in agreement with this plan. Please see above for specific wound care orders. We will see patient for re-evaluation in 1 week(s) here in the clinic. If anything worsens or changes patient will contact our office for additional recommendations. Electronic Signature(s) Signed: 08/06/2017 9:04:27 AM By: Worthy Keeler PA-C Entered By: Worthy Keeler on 08/06/2017 09:02:56 Maureen Morales ([id number]) -------------------------------------------------------------------------------- ROS/PFSH Details Patient Name: Maureen Morales Date of Service: 08/06/2017 8:00 AM Medical Record Number:  [id number] Patient Account Number: [id number] Date of Birth/Sex: [birth year]Maureen Morales (84 y.o. Female) Treating RN: Montey Hora Primary Care Provider: Deborra Medina Other Clinician: Referring Provider: Deborra Medina Treating Provider/Extender: Melburn Hake,  Weeks in Treatment: 0 Information Obtained From Caregiver Wound History Do you currently have one or more open woundso Yes How many open wounds do you currently haveo 1 Approximately how long have you had your woundso 2 1/2 months How have you been treating your wound(s) until nowo foam bandage Has your wound(s) ever healed and then re-openedo No Have you had any lab work done in the past montho No Have you tested positive for an antibiotic resistant organism (MRSA, VRE)o No Have you tested positive for osteomyelitis (bone infection)o No Have you had any tests for circulation on your legso No Constitutional Symptoms (General Health) Complaints and Symptoms: No Complaints or Symptoms Eyes Complaints and Symptoms: No Complaints or Symptoms Ear/Nose/Mouth/Throat Complaints and Symptoms: No Complaints or Symptoms Medical History: Past Medical History Notes: HOH Hematologic/Lymphatic Complaints and Symptoms: No Complaints or Symptoms Respiratory Medical History: Positive for: Asthma Negative for: Aspiration; Chronic Obstructive Pulmonary Disease (COPD); Pneumothorax; Sleep Apnea; Tuberculosis Cardiovascular Medical History: Positive for: Coronary Artery Disease; Hypertension; Myocardial Infarction - 2018; Peripheral Venous Disease Negative for: Angina; Arrhythmia; Congestive Heart Failure; Deep Vein Thrombosis; Hypotension; Peripheral Arterial Disease; Phlebitis; Vasculitis Maureen Morales, Maureen Morales ([id number]) Gastrointestinal Complaints and Symptoms: No Complaints or Symptoms Medical History: Positive for: Colitis Negative for: Cirrhosis ; Crohnos; Hepatitis A; Hepatitis B; Hepatitis C Past Medical History  Notes: GERD Endocrine Medical History: Positive for: Type II Diabetes - 7.0 A1C Treated with: Oral agents Genitourinary Complaints and Symptoms: No Complaints or Symptoms Immunological Complaints and Symptoms: No Complaints or Symptoms Medical History: Negative for: Lupus Erythematosus; Raynaudos; Scleroderma Integumentary (Skin) Complaints and Symptoms: No Complaints or Symptoms Musculoskeletal Complaints and Symptoms: No Complaints or Symptoms Medical History: Positive for: Osteoarthritis Negative for: Gout; Rheumatoid Arthritis; Osteomyelitis Neurologic Complaints and Symptoms: No Complaints or Symptoms Medical History: Positive for: Neuropathy Negative for: Dementia Oncologic Complaints and Symptoms: No Complaints or Symptoms Medical HistoryLAKIYA, Maureen Morales ([id number]) Negative for: Received Chemotherapy; Received Radiation Immunizations Pneumococcal Vaccine: Received Pneumococcal Vaccination: Yes Immunization Notes: up to date Implantable Devices Family and Social History Cancer: Yes - Siblings,Mother; Diabetes: No; Heart Disease: Yes - Father; Hereditary Spherocytosis: No; Hypertension: Yes - Father; Kidney Disease: No; Lung Disease: No; Seizures: No; Stroke: No; Thyroid Problems: No; Tuberculosis: No; Never smoker; Marital Status - Married; Alcohol Use: Never; Drug Use: No History; Caffeine Use: Daily; Financial Concerns: No; Food, Clothing or Shelter Needs: No; Support System Lacking: No; Transportation Concerns: No; Advanced Directives: No; Patient does not want information on Advanced Directives Electronic Signature(s) Signed: 08/06/2017 9:04:27 AM By: Worthy Keeler PA-C Signed: 08/06/2017 4:12:39 PM By: Montey Hora Entered By: Montey Hora on 08/06/2017 08:25:54 Maureen Morales ([id number]) -------------------------------------------------------------------------------- SuperBill Details Patient Name: Maureen Morales. Date of Service:  08/06/2017 Medical Record Number:  [id number] Patient Account Number: [id number] Date of Birth/Sex: [birth year]Maureen Morales (84 y.o. Female) Treating RN: Roger Shelter Primary Care Provider: Deborra Medina Other Clinician: Referring Provider: Deborra Medina Treating Provider/Extender: Melburn Hake,  Weeks in Treatment: 0 Diagnosis Coding ICD-10 Codes Code Description [phone] Pressure ulcer of right heel, stage 3 E11.621 Type 2 diabetes mellitus with foot ulcer I10 Essential (primary) hypertension Z96.641 Presence of right artificial hip joint Facility Procedures CPT4 Code: [id number] Description: 99213 - WOUND CARE VISIT-LEV 3 EST PT Modifier: Quantity: 1 Physician Procedures CPT4 Code: [id number] Description: 14431 - WC PHYS LEVEL 3 - EST PT ICD-10 Diagnosis Description L89.613 Pressure ulcer of right heel, stage 3 E11.621 Type 2 diabetes mellitus with foot ulcer I10 Essential (primary) hypertension Z96.641 Presence of right artificial hip joint Modifier: Quantity: 1 Electronic Signature(s) Signed: 08/06/2017 9:04:27 AM By: Worthy Keeler PA-C Entered By: Worthy Keeler on 08/06/2017 09:03:30

## 2017-08-08 ENCOUNTER — Telehealth: Payer: Self-pay | Admitting: Internal Medicine

## 2017-08-08 DIAGNOSIS — I130 Hypertensive heart and chronic kidney disease with heart failure and stage 1 through stage 4 chronic kidney disease, or unspecified chronic kidney disease: Secondary | ICD-10-CM | POA: Diagnosis not present

## 2017-08-08 DIAGNOSIS — I2510 Atherosclerotic heart disease of native coronary artery without angina pectoris: Secondary | ICD-10-CM | POA: Diagnosis not present

## 2017-08-08 DIAGNOSIS — E1151 Type 2 diabetes mellitus with diabetic peripheral angiopathy without gangrene: Secondary | ICD-10-CM | POA: Diagnosis not present

## 2017-08-08 DIAGNOSIS — I252 Old myocardial infarction: Secondary | ICD-10-CM | POA: Diagnosis not present

## 2017-08-08 DIAGNOSIS — M9701XD Periprosthetic fracture around internal prosthetic right hip joint, subsequent encounter: Secondary | ICD-10-CM | POA: Diagnosis not present

## 2017-08-08 DIAGNOSIS — Z96641 Presence of right artificial hip joint: Secondary | ICD-10-CM | POA: Diagnosis not present

## 2017-08-08 NOTE — Progress Notes (Signed)
AMYIAH, GABA ([id number]) Visit Report for 08/06/2017 Allergy List Details Patient Name: Maureen Morales, Maureen Morales. Date of Service: 08/06/2017 8:00 AM Medical Record Number: [id number] Patient Account Number: [id number] Date of Birth/Sex: 10/29/1922 (84 y.o. Female) Treating RN: Montey Hora Primary Care Fred Franzen: Deborra Medina Other Clinician: Referring Ramah Langhans: Deborra Medina Treating Josue Kass/Extender: Melburn Hake, HOYT Weeks in Treatment: 0 Allergies Active Allergies aspirin atropine belladonna alkaloids codeine Darvon methocarbamol penicillin Sulfa (Sulfonamide Antibiotics) Fer-Iron Allergy Notes Electronic Signature(s) Signed: 08/06/2017 4:12:39 PM By: Montey Hora Entered By: Montey Hora on 08/06/2017 08:16:06 [name] ([id number]) -------------------------------------------------------------------------------- Arrival Information Details Patient Name: [name]. Date of Service: 08/06/2017 8:00 AM Medical Record Number: [id number] Patient Account Number: [id number] Date of Birth/Sex: January 31, 1923 (84 y.o. Female) Treating RN: Montey Hora Primary Care Florentina Marquart: Deborra Medina Other Clinician: Referring Danisha Brassfield: Deborra Medina Treating Unnamed Zeien/Extender: Melburn Hake, HOYT Weeks in Treatment: 0 Visit Information Patient Arrived: Wheel Chair Arrival Time: 08:12 Accompanied By: dtr Transfer Assistance: Manual Patient Identification Verified: Yes Secondary Verification Process Completed: Yes Patient Has Alerts: Yes Patient Alerts: DMII Electronic Signature(s) Signed: 08/06/2017 4:12:39 PM By: Montey Hora Entered By: Montey Hora on 08/06/2017 08:13:15 [name] ([id number]) -------------------------------------------------------------------------------- Clinic Level of Care Assessment Details Patient Name: [name] Date of Service: 08/06/2017 8:00 AM Medical Record Number: [id number] Patient Account Number: [id number] Date of Birth/Sex: Sep 06, 1922  (84 y.o. Female) Treating RN: Roger Shelter Primary Care Magdalina Whitehead: Deborra Medina Other Clinician: Referring Javia Dillow: Deborra Medina Treating Kyira Volkert/Extender: Melburn Hake, HOYT Weeks in Treatment: 0 Clinic Level of Care Assessment Items TOOL 2 Quantity Score X - Use when only an EandM is performed on the INITIAL visit 1 0 ASSESSMENTS - Nursing Assessment / Reassessment X - General Physical Exam (combine w/ comprehensive assessment (listed just below) when 1 20 performed on new pt. evals) X- 1 25 Comprehensive Assessment (HX, ROS, Risk Assessments, Wounds Hx, etc.) ASSESSMENTS - Wound and Skin Assessment / Reassessment X - Simple Wound Assessment / Reassessment - one wound 1 5 []  - 0 Complex Wound Assessment / Reassessment - multiple wounds []  - 0 Dermatologic / Skin Assessment (not related to wound area) ASSESSMENTS - Ostomy and/or Continence Assessment and Care []  - Incontinence Assessment and Management 0 []  - 0 Ostomy Care Assessment and Management (repouching, etc.) PROCESS - Coordination of Care X - Simple Patient / Family Education for ongoing care 1 15 []  - 0 Complex (extensive) Patient / Family Education for ongoing care []  - 0 Staff obtains Programmer, systems, Records, Test Results / Process Orders []  - 0 Staff telephones HHA, Nursing Homes / Clarify orders / etc []  - 0 Routine Transfer to another Facility (non-emergent condition) []  - 0 Routine Hospital Admission (non-emergent condition) []  - 0 New Admissions / Biomedical engineer / Ordering NPWT, Apligraf, etc. []  - 0 Emergency Hospital Admission (emergent condition) X- 1 10 Simple Discharge Coordination []  - 0 Complex (extensive) Discharge Coordination PROCESS - Special Needs []  - Pediatric / Minor Patient Management 0 []  - 0 Isolation Patient Management ALIANNY, TOELLE ([id number]) []  - 0 Hearing / Language / Visual special needs []  - 0 Assessment of Community assistance (transportation, D/C planning,  etc.) []  - 0 Additional assistance / Altered mentation []  - 0 Support Surface(s) Assessment (bed, cushion, seat, etc.) INTERVENTIONS - Wound Cleansing / Measurement X - Wound Imaging (photographs - any number of wounds) 1 5 []  - 0 Wound Tracing (instead of photographs) X- 1 5 Simple Wound Measurement - one wound []  - 0 Complex Wound  Measurement - multiple wounds X- 1 5 Simple Wound Cleansing - one wound []  - 0 Complex Wound Cleansing - multiple wounds INTERVENTIONS - Wound Dressings X - Small Wound Dressing one or multiple wounds 1 10 []  - 0 Medium Wound Dressing one or multiple wounds []  - 0 Large Wound Dressing one or multiple wounds []  - 0 Application of Medications - injection INTERVENTIONS - Miscellaneous []  - External ear exam 0 []  - 0 Specimen Collection (cultures, biopsies, blood, body fluids, etc.) []  - 0 Specimen(s) / Culture(s) sent or taken to Lab for analysis []  - 0 Patient Transfer (multiple staff / Civil Service fast streamer / Similar devices) []  - 0 Simple Staple / Suture removal (25 or less) []  - 0 Complex Staple / Suture removal (26 or more) []  - 0 Hypo / Hyperglycemic Management (close monitor of Blood Glucose) X- 1 15 Ankle / Brachial Index (ABI) - do not check if billed separately Has the patient been seen at the hospital within the last three years: Yes Total Score: 115 Level Of Care: New/Established - Level 3 Electronic Signature(s) Signed: 08/06/2017 2:19:56 PM By: Roger Shelter Entered By: Roger Shelter on 08/06/2017 08:48:27 [name] ([id number]) -------------------------------------------------------------------------------- Encounter Discharge Information Details Patient Name: [name]. Date of Service: 08/06/2017 8:00 AM Medical Record Number: [id number] Patient Account Number: [id number] Date of Birth/Sex: 23-Jul-1922 (84 y.o. Female) Treating RN: Carolyne Fiscal, Debi Primary Care Deverick Pruss: Deborra Medina Other Clinician: Referring Eira Alpert:  Deborra Medina Treating Emelda Kohlbeck/Extender: Melburn Hake, HOYT Weeks in Treatment: 0 Encounter Discharge Information Items Discharge Pain Level: 0 Discharge Condition: Stable Ambulatory Status: Wheelchair Discharge Destination: Home Transportation: Private Auto Accompanied By: daughter Schedule Follow-up Appointment: Yes Medication Reconciliation completed and No provided to Patient/Care Athanasios Heldman: Provided on Clinical Summary of Care: 08/06/2017 Form Type Recipient Paper Patient [facility] Electronic Signature(s) Signed: 08/07/2017 4:31:13 PM By: Ruthine Dose Entered By: Ruthine Dose on 08/06/2017 09:04:50 [name] ([id number]) -------------------------------------------------------------------------------- Lower Extremity Assessment Details Patient Name: [name]. Date of Service: 08/06/2017 8:00 AM Medical Record Number: [id number] Patient Account Number: [id number] Date of Birth/Sex: [birth year] (84 y.o. Female) Treating RN: Montey Hora Primary Care Kestrel Mis: Deborra Medina Other Clinician: Referring Annisa Mazzarella: Deborra Medina Treating Christinna Sprung/Extender: Melburn Hake, HOYT Weeks in Treatment: 0 Edema Assessment Assessed: [Left: No] [Right: No] Edema: [Left: Yes] [Right: Yes] Calf Left: Right: Point of Measurement: 33 cm From Medial Instep cm 22 cm Ankle Left: Right: Point of Measurement: 11 cm From Medial Instep cm 30.2 cm Vascular Assessment Pulses: Dorsalis Pedis Palpable: [Left:Yes] [Right:Yes] Doppler Audible: [Left:Yes] [Right:Yes] Posterior Tibial Palpable: [Left:Yes] [Right:Yes] Doppler Audible: [Left:Yes] [Right:Yes] Extremity colors, hair growth, and conditions: Extremity Color: [Left:Normal] [Right:Normal] Hair Growth on Extremity: [Left:No] [Right:No] Temperature of Extremity: [Left:Cool] [Right:Cool] Capillary Refill: [Left:< 3 seconds] [Right:< 3 seconds] Blood Pressure: Brachial: [Right:178] Dorsalis Pedis: [Left:Dorsalis Pedis: 210] Ankle: Posterior  Tibial: [Left:Posterior Tibial: 200] [Right:1.18] Toe Nail Assessment Left: Right: Thick: Yes Yes Discolored: No No Deformed: No No Improper Length and Hygiene: Yes Yes Electronic Signature(s) Signed: 08/06/2017 4:12:39 PM By: Montey Hora Entered By: Montey Hora on 08/06/2017 08:27:55 [name] ([id number] Fam, Raylene Miyamoto ([id number]) -------------------------------------------------------------------------------- Multi Wound Chart Details Patient Name: [name]. Date of Service: 08/06/2017 8:00 AM Medical Record Number: [id number] Patient Account Number: [id number] Date of Birth/Sex: 1922/11/14 (84 y.o. Female) Treating RN: Roger Shelter Primary Care Sokhna Christoph: Deborra Medina Other Clinician: Referring Doretha Goding: Deborra Medina Treating Wilman Tucker/Extender: Melburn Hake, HOYT Weeks in Treatment: 0 Vital Signs Height(in): 61 Pulse(bpm): 75 Weight(lbs): 124 Blood Pressure(mmHg):  176/66 Body Mass Index(BMI): 23 Temperature(F): 98.1 Respiratory Rate 16 (breaths/min): Photos: [1:No Photos] [N/A:N/A] Wound Location: [1:Right Calcaneus] [N/A:N/A] Wounding Event: [1:Pressure Injury] [N/A:N/A] Primary Etiology: [1:Pressure Ulcer] [N/A:N/A] Comorbid History: [1:Asthma, Coronary Artery Disease, Hypertension, Myocardial Infarction, Peripheral Venous Disease, Colitis, Type II Diabetes, Osteoarthritis, Neuropathy] [N/A:N/A] Date Acquired: [1:06/04/2017] [N/A:N/A] Weeks of Treatment: [1:0] [N/A:N/A] Wound Status: [1:Open] [N/A:N/A] Measurements L x W x D [1:4x3x0.1] [N/A:N/A] (cm) Area (cm) : [1:9.425] [N/A:N/A] Volume (cm) : [1:0.942] [N/A:N/A] Classification: [1:Category/Stage II] [N/A:N/A] Exudate Amount: [1:Large] [N/A:N/A] Exudate Type: [1:Serous] [N/A:N/A] Exudate Color: [1:amber] [N/A:N/A] Wound Margin: [1:Flat and Intact] [N/A:N/A] Granulation Amount: [1:Large (67-100%)] [N/A:N/A] Granulation Quality: [1:Red] [N/A:N/A] Necrotic Amount: [1:Small (1-33%)]  [N/A:N/A] Exposed Structures: [1:Fascia: No Fat Layer (Subcutaneous Tissue) Exposed: No Tendon: No Muscle: No Joint: No Bone: No] [N/A:N/A] Epithelialization: [1:None] [N/A:N/A] Periwound Skin Texture: [1:Excoriation: No Induration: No Callus: No Crepitus: No] [N/A:N/A] Rash: No Scarring: No Periwound Skin Moisture: Maceration: No N/A N/A Dry/Scaly: No Periwound Skin Color: Atrophie Blanche: No N/A N/A Cyanosis: No Ecchymosis: No Erythema: No Hemosiderin Staining: No Mottled: No Pallor: No Rubor: No Temperature: No Abnormality N/A N/A Tenderness on Palpation: Yes N/A N/A Wound Preparation: Ulcer Cleansing: N/A N/A Rinsed/Irrigated with Saline Topical Anesthetic Applied: Other: lidocaine 4% Treatment Notes Electronic Signature(s) Signed: 08/06/2017 2:19:56 PM By: Roger Shelter Entered By: Roger Shelter on 08/06/2017 08:39:51 [name] ([id number]) -------------------------------------------------------------------------------- [place] Details Patient Name: [name] Date of Service: 08/06/2017 8:00 AM Medical Record Number: [id number] Patient Account Number: [id number] Date of Birth/Sex: [birth year] (84 y.o. Female) Treating RN: Roger Shelter Primary Care Mercadez Heitman: Deborra Medina Other Clinician: Referring Simya Tercero: Deborra Medina Treating Inayah Woodin/Extender: Melburn Hake, HOYT Weeks in Treatment: 0 Active Inactive ` Orientation to the Wound Care Program Nursing Diagnoses: Knowledge deficit related to the wound healing center program Goals: Patient/caregiver will verbalize understanding of the [facility] Program Date Initiated: 08/06/2017 Target Resolution Date: 08/27/2017 Goal Status: Active Interventions: Provide education on orientation to the wound center Notes: ` Wound/Skin Impairment Nursing Diagnoses: Impaired tissue integrity Goals: Patient/caregiver will verbalize understanding of skin care regimen Date  Initiated: 08/06/2017 Target Resolution Date: 08/27/2017 Goal Status: Active Ulcer/skin breakdown will have a volume reduction of 30% by week 4 Date Initiated: 08/06/2017 Target Resolution Date: 08/27/2017 Goal Status: Active Interventions: Assess patient/caregiver ability to obtain necessary supplies Assess patient/caregiver ability to perform ulcer/skin care regimen upon admission and as needed Assess ulceration(s) every visit Treatment Activities: Skin care regimen initiated : 08/06/2017 Notes: Electronic Signature(s) Signed: 08/06/2017 2:19:56 PM By: Florestine Avers, Raylene Miyamoto ([id number]) Entered By: Roger Shelter on 08/06/2017 08:39:35 [name] ([id number]) -------------------------------------------------------------------------------- Pain Assessment Details Patient Name: [name]. Date of Service: 08/06/2017 8:00 AM Medical Record Number: [id number] Patient Account Number: [id number] Date of Birth/Sex: 1922/11/21 (84 y.o. Female) Treating RN: Montey Hora Primary Care Garv Kuechle: Deborra Medina Other Clinician: Referring Stylianos Stradling: Deborra Medina Treating Kiri Hinderliter/Extender: Melburn Hake, HOYT Weeks in Treatment: 0 Active Problems Location of Pain Severity and Description of Pain Patient Has Paino Yes Site Locations Pain Location: Pain in Ulcers With Dressing Change: Yes Pain Management and Medication Current Pain Management: Notes Topical or injectable lidocaine is offered to patient for acute pain when surgical debridement is performed. If needed, Patient is instructed to use over the counter pain medication for the following 24-48 hours after debridement. Wound care MDs do not prescribed pain medications. Patient has chronic pain or uncontrolled pain. Patient has been instructed to make an appointment with their Primary Care Physician  for pain management. Electronic Signature(s) Signed: 08/06/2017 4:12:39 PM By: Montey Hora Entered By: Montey Hora on  08/06/2017 08:16:23 [name] ([id number]) -------------------------------------------------------------------------------- Patient/Caregiver Education Details Patient Name: [name] Date of Service: 08/06/2017 8:00 AM Medical Record Number: [id number] Patient Account Number: [id number] Date of Birth/Gender: 12/14/1922 (84 y.o. Female) Treating RN: Ahmed Prima Primary Care Physician: Deborra Medina Other Clinician: Referring Physician: Deborra Medina Treating Physician/Extender: Melburn Hake, HOYT Weeks in Treatment: 0 Education Assessment Education Provided To: Patient Education Topics Provided Welcome To [facility]: Handouts: [facility] Methods: Explain/Verbal Responses: State content correctly Wound/Skin Impairment: Handouts: Caring for Your Ulcer, Other: change dressing as ordered Methods: Demonstration, Explain/Verbal Responses: State content correctly Electronic Signature(s) Signed: 08/06/2017 4:32:12 PM By: Alric Quan Entered By: Alric Quan on 08/06/2017 09:03:53 [name] ([id number]) -------------------------------------------------------------------------------- Wound Assessment Details Patient Name: [name]. Date of Service: 08/06/2017 8:00 AM Medical Record Number: [id number] Patient Account Number: [id number] Date of Birth/Sex: Jul 06, 1922 (84 y.o. Female) Treating RN: Montey Hora Primary Care Oakley Kossman: Deborra Medina Other Clinician: Referring Kaleah Hagemeister: Deborra Medina Treating Lillis Nuttle/Extender: Melburn Hake, HOYT Weeks in Treatment: 0 Wound Status Wound Number: 1 Primary Pressure Ulcer Etiology: Wound Location: Right Calcaneus Wound Open Wounding Event: Pressure Injury Status: Date Acquired: 06/04/2017 Comorbid Asthma, Coronary Artery Disease, Hypertension, Weeks Of Treatment: 0 History: Myocardial Infarction, Peripheral Venous Clustered Wound: No Disease, Colitis, Type II Diabetes,  Osteoarthritis, Neuropathy Photos Photo Uploaded By: Montey Hora on 08/06/2017 09:40:05 Wound Measurements Length: (cm) 4 Width: (cm) 3 Depth: (cm) 0.1 Area: (cm) 9.425 Volume: (cm) 0.942 % Reduction in Area: 0% % Reduction in Volume: 0% Epithelialization: None Tunneling: No Undermining: No Wound Description Classification: Category/Stage III Foul O Wound Margin: Flat and Intact Slough Exudate Amount: Large Exudate Type: Serous Exudate Color: amber dor After Cleansing: No /Fibrino Yes Wound Bed Granulation Amount: Large (67-100%) Exposed Structure Granulation Quality: Red Fascia Exposed: No Necrotic Amount: Small (1-33%) Fat Layer (Subcutaneous Tissue) Exposed: No Necrotic Quality: Adherent Slough Tendon Exposed: No Muscle Exposed: No Joint Exposed: No Bone Exposed: No Ruark, Reneshia H. ([id number]) Periwound Skin Texture Texture Color No Abnormalities Noted: No No Abnormalities Noted: No Callus: No Atrophie Blanche: No Crepitus: No Cyanosis: No Excoriation: No Ecchymosis: No Induration: No Erythema: No Rash: No Hemosiderin Staining: No Scarring: No Mottled: No Pallor: No Moisture Rubor: No No Abnormalities Noted: No Dry / Scaly: No Temperature / Pain Maceration: No Temperature: No Abnormality Tenderness on Palpation: Yes Wound Preparation Ulcer Cleansing: Rinsed/Irrigated with Saline Topical Anesthetic Applied: Other: lidocaine 4%, Treatment Notes Wound #1 (Right Calcaneus) 1. Cleansed with: Clean wound with Normal Saline 2. Anesthetic Topical Lidocaine 4% cream to wound bed prior to debridement 3. Peri-wound Care: Skin Prep 4. Dressing Applied: Prisma Ag 5. Secondary Dressing Applied Bordered Foam Dressing Electronic Signature(s) Signed: 08/06/2017 9:04:27 AM By: Worthy Keeler PA-C Signed: 08/06/2017 4:12:39 PM By: Montey Hora Entered By: Worthy Keeler on 08/06/2017 08:57:40 [name]  ([id number]) -------------------------------------------------------------------------------- Vitals Details Patient Name: [name] Date of Service: 08/06/2017 8:00 AM Medical Record Number: [id number] Patient Account Number: [id number] Date of Birth/Sex: 28-May-1922 (84 y.o. Female) Treating RN: Montey Hora Primary Care Oaklee Esther: Deborra Medina Other Clinician: Referring Clee Pandit: Deborra Medina Treating Kiron Osmun/Extender: Melburn Hake, HOYT Weeks in Treatment: 0 Vital Signs Time Taken: 08:16 Temperature (F): 98.1 Height (in): 61 Pulse (bpm): 75 Source: Measured Respiratory Rate (breaths/min): 16 Weight (lbs): 124 Blood Pressure (mmHg): 176/66 Source: Measured Reference Range: 80 - 120  mg / dl Body Mass Index (BMI): 23.4 Electronic Signature(s) Signed: 08/06/2017 4:12:39 PM By: Montey Hora Entered By: Montey Hora on 08/06/2017 08:16:58

## 2017-08-08 NOTE — Telephone Encounter (Signed)
Copied from [place] [phone]. Topic: Quick Communication - See Telephone Encounter >> Aug 08, 2017  2:24 PM Vernona Rieger wrote: CRM for notification. See Telephone encounter for: 08/08/17.  [facility] :: She is requesting verbals for 8 more physical therapy visits for rehab. She needs a nursing referral to monitor her right heel wound. Call back is [phone] if you call back today if not call the office @ [phone]

## 2017-08-08 NOTE — Telephone Encounter (Signed)
Verbal given for PT and to monitor wound to left foot. FYI.

## 2017-08-08 NOTE — Telephone Encounter (Signed)
Please see the attached request for verbal orders and nursing referral.  Notice the two different numbers listed for call back.

## 2017-08-13 ENCOUNTER — Encounter: Payer: Medicare Other | Admitting: Physician Assistant

## 2017-08-13 DIAGNOSIS — L89613 Pressure ulcer of right heel, stage 3: Secondary | ICD-10-CM | POA: Diagnosis not present

## 2017-08-13 DIAGNOSIS — I130 Hypertensive heart and chronic kidney disease with heart failure and stage 1 through stage 4 chronic kidney disease, or unspecified chronic kidney disease: Secondary | ICD-10-CM | POA: Diagnosis not present

## 2017-08-13 DIAGNOSIS — M9701XD Periprosthetic fracture around internal prosthetic right hip joint, subsequent encounter: Secondary | ICD-10-CM | POA: Diagnosis not present

## 2017-08-13 DIAGNOSIS — E1151 Type 2 diabetes mellitus with diabetic peripheral angiopathy without gangrene: Secondary | ICD-10-CM | POA: Diagnosis not present

## 2017-08-13 DIAGNOSIS — I10 Essential (primary) hypertension: Secondary | ICD-10-CM | POA: Diagnosis not present

## 2017-08-13 DIAGNOSIS — I2510 Atherosclerotic heart disease of native coronary artery without angina pectoris: Secondary | ICD-10-CM | POA: Diagnosis not present

## 2017-08-13 DIAGNOSIS — E1140 Type 2 diabetes mellitus with diabetic neuropathy, unspecified: Secondary | ICD-10-CM | POA: Diagnosis not present

## 2017-08-13 DIAGNOSIS — E11621 Type 2 diabetes mellitus with foot ulcer: Secondary | ICD-10-CM | POA: Diagnosis not present

## 2017-08-13 DIAGNOSIS — I252 Old myocardial infarction: Secondary | ICD-10-CM | POA: Diagnosis not present

## 2017-08-13 DIAGNOSIS — L97412 Non-pressure chronic ulcer of right heel and midfoot with fat layer exposed: Secondary | ICD-10-CM | POA: Diagnosis not present

## 2017-08-13 DIAGNOSIS — Z96641 Presence of right artificial hip joint: Secondary | ICD-10-CM | POA: Diagnosis not present

## 2017-08-14 DIAGNOSIS — I130 Hypertensive heart and chronic kidney disease with heart failure and stage 1 through stage 4 chronic kidney disease, or unspecified chronic kidney disease: Secondary | ICD-10-CM | POA: Diagnosis not present

## 2017-08-14 DIAGNOSIS — Z7984 Long term (current) use of oral hypoglycemic drugs: Secondary | ICD-10-CM | POA: Diagnosis not present

## 2017-08-14 DIAGNOSIS — Z96641 Presence of right artificial hip joint: Secondary | ICD-10-CM | POA: Diagnosis not present

## 2017-08-14 DIAGNOSIS — Z9181 History of falling: Secondary | ICD-10-CM | POA: Diagnosis not present

## 2017-08-14 DIAGNOSIS — F411 Generalized anxiety disorder: Secondary | ICD-10-CM | POA: Diagnosis not present

## 2017-08-14 DIAGNOSIS — E785 Hyperlipidemia, unspecified: Secondary | ICD-10-CM | POA: Diagnosis not present

## 2017-08-14 DIAGNOSIS — J4520 Mild intermittent asthma, uncomplicated: Secondary | ICD-10-CM | POA: Diagnosis not present

## 2017-08-14 DIAGNOSIS — L89610 Pressure ulcer of right heel, unstageable: Secondary | ICD-10-CM | POA: Diagnosis not present

## 2017-08-14 DIAGNOSIS — W19XXXD Unspecified fall, subsequent encounter: Secondary | ICD-10-CM | POA: Diagnosis not present

## 2017-08-14 DIAGNOSIS — E1122 Type 2 diabetes mellitus with diabetic chronic kidney disease: Secondary | ICD-10-CM | POA: Diagnosis not present

## 2017-08-14 DIAGNOSIS — N183 Chronic kidney disease, stage 3 (moderate): Secondary | ICD-10-CM | POA: Diagnosis not present

## 2017-08-14 DIAGNOSIS — I252 Old myocardial infarction: Secondary | ICD-10-CM | POA: Diagnosis not present

## 2017-08-14 DIAGNOSIS — I5032 Chronic diastolic (congestive) heart failure: Secondary | ICD-10-CM | POA: Diagnosis not present

## 2017-08-14 DIAGNOSIS — H9042 Sensorineural hearing loss, unilateral, left ear, with unrestricted hearing on the contralateral side: Secondary | ICD-10-CM | POA: Diagnosis not present

## 2017-08-14 DIAGNOSIS — F329 Major depressive disorder, single episode, unspecified: Secondary | ICD-10-CM | POA: Diagnosis not present

## 2017-08-14 DIAGNOSIS — E8809 Other disorders of plasma-protein metabolism, not elsewhere classified: Secondary | ICD-10-CM | POA: Diagnosis not present

## 2017-08-14 DIAGNOSIS — E1151 Type 2 diabetes mellitus with diabetic peripheral angiopathy without gangrene: Secondary | ICD-10-CM | POA: Diagnosis not present

## 2017-08-14 DIAGNOSIS — M9701XD Periprosthetic fracture around internal prosthetic right hip joint, subsequent encounter: Secondary | ICD-10-CM | POA: Diagnosis not present

## 2017-08-14 DIAGNOSIS — L89612 Pressure ulcer of right heel, stage 2: Secondary | ICD-10-CM | POA: Diagnosis not present

## 2017-08-14 DIAGNOSIS — I2510 Atherosclerotic heart disease of native coronary artery without angina pectoris: Secondary | ICD-10-CM | POA: Diagnosis not present

## 2017-08-16 ENCOUNTER — Other Ambulatory Visit: Payer: Self-pay | Admitting: Internal Medicine

## 2017-08-17 DIAGNOSIS — E1151 Type 2 diabetes mellitus with diabetic peripheral angiopathy without gangrene: Secondary | ICD-10-CM | POA: Diagnosis not present

## 2017-08-17 DIAGNOSIS — I2510 Atherosclerotic heart disease of native coronary artery without angina pectoris: Secondary | ICD-10-CM | POA: Diagnosis not present

## 2017-08-17 DIAGNOSIS — L89612 Pressure ulcer of right heel, stage 2: Secondary | ICD-10-CM | POA: Diagnosis not present

## 2017-08-17 DIAGNOSIS — Z96641 Presence of right artificial hip joint: Secondary | ICD-10-CM | POA: Diagnosis not present

## 2017-08-17 DIAGNOSIS — M9701XD Periprosthetic fracture around internal prosthetic right hip joint, subsequent encounter: Secondary | ICD-10-CM | POA: Diagnosis not present

## 2017-08-17 DIAGNOSIS — I252 Old myocardial infarction: Secondary | ICD-10-CM | POA: Diagnosis not present

## 2017-08-17 NOTE — Progress Notes (Signed)
[name], [name] ([id number]) Visit Report for 08/13/2017 Chief Complaint Document Details Patient Name: Maureen Morales, Maureen Morales. Date of Service: 08/13/2017 8:30 AM Medical Record Number: [id number] Patient Account Number: [id number] Date of Birth/Sex: 04-19-1923 (84 y.o. F) Treating RN: Roger Shelter Primary Care Provider: Deborra Medina Other Clinician: Referring Provider: Deborra Medina Treating Provider/Extender: Melburn Hake, HOYT Weeks in Treatment: 1 Information Obtained from: Patient Chief Complaint Right heel ulcer Electronic Signature(s) Signed: 08/13/2017 5:02:11 PM By: Worthy Keeler PA-C Entered By: Worthy Keeler on 08/13/2017 09:05:42 Maureen Morales ([id number]) -------------------------------------------------------------------------------- HPI Details Patient Name: Maureen Morales Date of Service: 08/13/2017 8:30 AM Medical Record Number: [id number] Patient Account Number: [id number] Date of Birth/Sex: 1922-05-24 (84 y.o. 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 This developed when she was in the hospital over a period of 3-4 days at the end of January for a right hip replacement. She unfortunately had fallen and fractured her hip. With that being said the patient seems to have done very well in that regard and is showing signs of getting better as far as her therapy is concerned. Unfortunately the heel ulcer which she developed while she was in the hospital seems to have continued to give her trouble and she did have eschar  still covering when she arrived today. With that being said this was subsequently removed today just during cleansing of the heel prior to me entering the room popped right off. Underline there appears to be a very well healing area of ulceration which is very superficial and scattered but there does not appear to be any evidence of infection which is good news. She has good epithelialization. No fevers, chills, nausea, or vomiting noted at this time. Patient does have discomfort she is currently undergoing physical therapy though they are doing partial weight bearing only utilizing the walker I think that is appropriate. I do not want her fully weight-bearing on this area yet. She does have swelling of the bilateral lower extremities this seems to be secondary to the hydralazine which has been increased recently according to her doctor. 08/13/17 on evaluation today patient's heel ulcer actually appears to be doing excellent at this time. She has been tolerating the dressing changes without complication the Prisma seems to be agreeing with her. Overall there's no evidence of infection and I do believe that she has been taking care of the wound very nicely over the past week since I last saw her. Patient does complain about having some pain along the plantar surface of the foot this was further evaluated today as well briefly. Electronic Signature(s) Signed: 08/13/2017 5:02:11 PM By: Worthy Keeler PA-C Entered By: Worthy Keeler on 08/13/2017 16:15:11 Maureen Morales ([id number]) -------------------------------------------------------------------------------- Physical Exam Details Patient Name: Maureen Morales Date of Service: 08/13/2017 8:30 AM Medical Record Number: [id number] Patient Account Number: [id number] Date of Birth/Sex: 05/18/1922 (84 y.o. F) Treating RN: Roger Shelter Primary Care Provider: Deborra Medina Other Clinician: Referring Provider: Deborra Medina Treating  Provider/Extender: STONE III, HOYT Weeks in Treatment: 1 Constitutional Well-nourished and well-hydrated in no acute distress. Respiratory normal breathing without difficulty. clear to auscultation bilaterally. Cardiovascular  regular rate and rhythm with normal S1, S2. Psychiatric this patient is able to make decisions and demonstrates good insight into disease process. Alert and Oriented x 3. pleasant and cooperative. Notes Based on the location and type of tenderness that she experiences right along the plantar fascia I believe that her pain at this point in time is actually plantar fasciitis and nothing to do with any cellulitis or other significant abnormalities at this point. I do think that her wound is doing well and I'm really not concerned about the possibility of infection currently. Nonetheless the wounds themselves appear to be very clean and small that was good epithelialization noted and overall I'm very pleased with how this seems to be progressing at this point. Electronic Signature(s) Signed: 08/13/2017 5:02:11 PM By: Worthy Keeler PA-C Entered By: Worthy Keeler on 08/13/2017 16:17:14 Maureen Morales ([id number]) -------------------------------------------------------------------------------- Physician Orders Details Patient Name: Maureen Morales Date of Service: 08/13/2017 8:30 AM Medical Record Number: [id number] Patient Account Number: [id number] Date of Birth/Sex: Mar 01, 1923 (84 y.o. F) Treating RN: Montey Hora Primary Care Provider: Deborra Medina Other Clinician: Referring Provider: Deborra Medina Treating Provider/Extender: Melburn Hake, HOYT Weeks in Treatment: 1 Verbal / Phone Orders: No Diagnosis Coding ICD-10 Coding Code Description [phone] Pressure ulcer of right heel, stage 3 E11.621 Type 2 diabetes mellitus with foot ulcer I10 Essential (primary) hypertension Z96.641 Presence of right artificial hip joint Wound Cleansing Wound #1 Right  Calcaneus o Clean wound with Normal Saline. Anesthetic (add to Medication List) Wound #1 Right Calcaneus o Topical Lidocaine 4% cream applied to wound bed prior to debridement (In Clinic Only). Primary Wound Dressing Wound #1 Right Calcaneus o Silver Collagen Secondary Dressing Wound #1 Right Calcaneus o Boardered Foam Dressing Dressing Change Frequency Wound #1 Right Calcaneus o Change dressing every other day. Follow-up Appointments Wound #1 Right Calcaneus o Return Appointment in 1 week. Electronic Signature(s) Signed: 08/13/2017 4:05:33 PM By: Montey Hora Signed: 08/13/2017 5:02:11 PM By: Worthy Keeler PA-C Entered By: Montey Hora on 08/13/2017 09:15:21 Maureen Morales ([id number]) -------------------------------------------------------------------------------- Problem List Details Patient Name: CARLON, DAVIDSON. Date of Service: 08/13/2017 8:30 AM Medical Record Number: [id number] Patient Account Number: [id number] Date of Birth/Sex: [birth year] (84 y.o. F) Treating RN: Roger Shelter Primary Care Provider: Deborra Medina Other Clinician: Referring Provider: Deborra Medina Treating Provider/Extender: Melburn Hake, HOYT Weeks in Treatment: 1 Active Problems ICD-10 Impacting Encounter Code Description Active Date Wound Healing Diagnosis L89.613 Pressure ulcer of right heel, stage 3 08/06/2017 Yes E11.621 Type 2 diabetes mellitus with foot ulcer 08/06/2017 Yes I10 Essential (primary) hypertension 08/06/2017 Yes Z96.641 Presence of right artificial hip joint 08/06/2017 Yes Inactive Problems Resolved Problems Electronic Signature(s) Signed: 08/13/2017 5:02:11 PM By: Worthy Keeler PA-C Entered By: Worthy Keeler on 08/13/2017 09:05:31 Maureen Morales ([id number]) -------------------------------------------------------------------------------- Progress Note Details Patient Name: Maureen Morales. Date of Service: 08/13/2017 8:30 AM Medical Record Number:  [id number] Patient Account Number: [id number] Date of Birth/Sex: 1922/12/31 (84 y.o. 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 This  developed when she was in the hospital over a period of 3-4 days at the end of January for a right hip replacement. She unfortunately had fallen and fractured her hip. With that being said the patient seems to have done very well in that regard and is showing signs of getting better as far as her therapy is concerned. Unfortunately the heel ulcer which she developed while she was in the hospital seems to have continued to give her trouble and she did have eschar still covering when she arrived today. With that being said this was subsequently removed today just during cleansing of the heel prior to me entering the room popped right off. Underline there appears to be a very well healing area of ulceration which is very superficial and scattered but there does not appear to be any evidence of infection which is good news. She has good epithelialization. No fevers, chills, nausea, or vomiting noted at this time. Patient does have discomfort she is currently undergoing physical therapy though they are doing partial weight bearing only utilizing the walker I think that is appropriate. I do not want her fully weight-bearing on this area yet. She does have swelling of the bilateral lower  extremities this seems to be secondary to the hydralazine which has been increased recently according to her doctor. 08/13/17 on evaluation today patient's heel ulcer actually appears to be doing excellent at this time. She has been tolerating the dressing changes without complication the Prisma seems to be agreeing with her. Overall there's no evidence of infection and I do believe that she has been taking care of the wound very nicely over the past week since I last saw her. Patient does complain about having some pain along the plantar surface of the foot this was further evaluated today as well briefly. Patient History Information obtained from Patient. Family History Cancer - Siblings,Mother, Heart Disease - Father, Hypertension - Father, No family history of Diabetes, Hereditary Spherocytosis, Kidney Disease, Lung Disease, Seizures, Stroke, Thyroid Problems, Tuberculosis. Social History Never smoker, Marital Status - Married, Alcohol Use - Never, Drug Use - No History, Caffeine Use - Daily. Medical And Surgical History Notes Ear/Nose/Mouth/Throat HOH Gastrointestinal GERD Review of Systems (ROS) MARESA, MORASH. ([id number]) Constitutional Symptoms (General Health) Denies complaints or symptoms of Fever, Chills. Respiratory The patient has no complaints or symptoms. Cardiovascular The patient has no complaints or symptoms. Psychiatric The patient has no complaints or symptoms. Objective Constitutional Well-nourished and well-hydrated in no acute distress. Vitals Time Taken: 8:45 AM, Height: 61 in, Weight: 124 lbs, BMI: 23.4, Temperature: 98.2 F, Pulse: 74 bpm, Respiratory Rate: 18 breaths/min, Blood Pressure: 159/50 mmHg. Respiratory normal breathing without difficulty. clear to auscultation bilaterally. Cardiovascular regular rate and rhythm with normal S1, S2. Psychiatric this patient is able to make decisions and demonstrates good insight into disease process. Alert and  Oriented x 3. pleasant and cooperative. General Notes: Based on the location and type of tenderness that she experiences right along the plantar fascia I believe that her pain at this point in time is actually plantar fasciitis and nothing to do with any cellulitis or other significant abnormalities at this point. I do think that her wound is doing well and I'm really not concerned about the possibility of infection currently. Nonetheless the wounds themselves appear to be very clean and small that was good epithelialization noted and overall I'm very pleased with how this seems to be progressing at this point. Integumentary (Hair, Skin) Wound #1 status  is Open. Original cause of wound was Pressure Injury. The wound is located on the Right Calcaneus. The wound measures 0.5cm length x 1.1cm width x 0.1cm depth; 0.432cm^2 area and 0.043cm^3 volume. There is no tunneling or undermining noted. There is a large amount of serous drainage noted. The wound margin is flat and intact. There is large (67-100%) red granulation within the wound bed. There is no necrotic tissue within the wound bed. The periwound skin appearance did not exhibit: Callus, Crepitus, Excoriation, Induration, Rash, Scarring, Dry/Scaly, Maceration, Atrophie Blanche, Cyanosis, Ecchymosis, Hemosiderin Staining, Mottled, Pallor, Rubor, Erythema. Periwound temperature was noted as No Abnormality. The periwound has tenderness on palpation. Assessment HUXLEY, VANWAGONER ([id number]) Active Problems ICD-10 [phone] - Pressure ulcer of right heel, stage 3 E11.621 - Type 2 diabetes mellitus with foot ulcer I10 - Essential (primary) hypertension Z96.641 - Presence of right artificial hip joint Plan Wound Cleansing: Wound #1 Right Calcaneus: Clean wound with Normal Saline. Anesthetic (add to Medication List): Wound #1 Right Calcaneus: Topical Lidocaine 4% cream applied to wound bed prior to debridement (In Clinic Only). Primary Wound  Dressing: Wound #1 Right Calcaneus: Silver Collagen Secondary Dressing: Wound #1 Right Calcaneus: Boardered Foam Dressing Dressing Change Frequency: Wound #1 Right Calcaneus: Change dressing every other day. Follow-up Appointments: Wound #1 Right Calcaneus: Return Appointment in 1 week. I am going to suggest at this time that we continue with the Current wound care measures for the next week. The patient is in agreement with the plan. We will subsequently see were things stand following and if anything changes or worsens in the interim she will let us know. Please see above for specific wound care orders. We will see patient for re-evaluation in 1 week(s) here in the clinic. If anything worsens or changes patient will contact our office for additional recommendations. Electronic Signature(s) Signed: 08/13/2017 5:02:11 PM By: Worthy Keeler PA-C Entered By: Worthy Keeler on 08/13/2017 16:17:26 Maureen Morales ([id number]) -------------------------------------------------------------------------------- ROS/PFSH Details Patient Name: Maureen Morales Date of Service: 08/13/2017 8:30 AM Medical Record Number: [id number] Patient Account Number: [id number] Date of Birth/Sex: 1922/10/11 (84 y.o. F) Treating RN: Roger Shelter Primary Care Provider: Deborra Medina Other Clinician: Referring Provider: Deborra Medina Treating Provider/Extender: Melburn Hake, HOYT Weeks in Treatment: 1 Information Obtained From Patient Wound History Do you currently have one or more open woundso Yes How many open wounds do you currently haveo 1 Approximately how long have you had your woundso 2 1/2 months How have you been treating your wound(s) until nowo foam bandage Has your wound(s) ever healed and then re-openedo No Have you had any lab work done in the past montho No Have you tested positive for an antibiotic resistant organism (MRSA, VRE)o No Have you tested positive for osteomyelitis (bone  infection)o No Have you had any tests for circulation on your legso No Constitutional Symptoms (General Health) Complaints and Symptoms: Negative for: Fever; Chills Ear/Nose/Mouth/Throat Medical History: Past Medical History Notes: HOH Respiratory Complaints and Symptoms: No Complaints or Symptoms Medical History: Positive for: Asthma Negative for: Aspiration; Chronic Obstructive Pulmonary Disease (COPD); Pneumothorax; Sleep Apnea; Tuberculosis Cardiovascular Complaints and Symptoms: No Complaints or Symptoms Medical History: Positive for: Coronary Artery Disease; Hypertension; Myocardial Infarction - 2018; Peripheral Venous Disease Negative for: Angina; Arrhythmia; Congestive Heart Failure; Deep Vein Thrombosis; Hypotension; Peripheral Arterial Disease; Phlebitis; Vasculitis Gastrointestinal Medical History: Positive for: Colitis Negative for: Cirrhosis ; Crohnos; Hepatitis A; Hepatitis B; Hepatitis C Past Medical History NotesKIMERLY, ROWAND ([id number]) GERD  Endocrine Medical History: Positive for: Type II Diabetes - 7.0 A1C Treated with: Oral agents Immunological Medical History: Negative for: Lupus Erythematosus; Raynaudos; Scleroderma Musculoskeletal Medical History: Positive for: Osteoarthritis Negative for: Gout; Rheumatoid Arthritis; Osteomyelitis Neurologic Medical History: Positive for: Neuropathy Negative for: Dementia Oncologic Medical History: Negative for: Received Chemotherapy; Received Radiation Psychiatric Complaints and Symptoms: No Complaints or Symptoms Immunizations Pneumococcal Vaccine: Received Pneumococcal Vaccination: Yes Immunization Notes: up to date Implantable Devices Family and Social History Cancer: Yes - Siblings,Mother; Diabetes: No; Heart Disease: Yes - Father; Hereditary Spherocytosis: No; Hypertension: Yes - Father; Kidney Disease: No; Lung Disease: No; Seizures: No; Stroke: No; Thyroid Problems: No; Tuberculosis: No;  Never smoker; Marital Status - Married; Alcohol Use: Never; Drug Use: No History; Caffeine Use: Daily; Financial Concerns: No; Food, Clothing or Shelter Needs: No; Support System Lacking: No; Transportation Concerns: No; Advanced Directives: No; Patient does not want information on Advanced Directives Physician Affirmation I have reviewed and agree with the above information. Electronic Signature(s) Signed: 08/13/2017 5:02:11 PM By: Worthy Keeler PA-C Signed: 08/13/2017 5:03:04 PM By: Roger Shelter Entered By: Worthy Keeler on 08/13/2017 16:15:32 TYSHA, GRISMORE ([id number], STEHLE ([id number]) -------------------------------------------------------------------------------- SuperBill Details Patient Name: Maureen Morales Date of Service: 08/13/2017 Medical Record Number: [id number] Patient Account Number: [id number] Date of Birth/Sex: 1923/04/26 (84 y.o. F) Treating RN: Roger Shelter Primary Care Provider: Deborra Medina Other Clinician: Referring Provider: Deborra Medina Treating Provider/Extender: Melburn Hake, HOYT Weeks in Treatment: 1 Diagnosis Coding ICD-10 Codes Code Description [phone] Pressure ulcer of right heel, stage 3 E11.621 Type 2 diabetes mellitus with foot ulcer I10 Essential (primary) hypertension Z96.641 Presence of right artificial hip joint Facility Procedures CPT4 Code: [id number] Description: [phone] - WOUND CARE VISIT-LEV 2 EST PT Modifier: Quantity: 1 Physician Procedures CPT4 Code: [id number] Description: 80321 - WC PHYS LEVEL 3 - EST PT ICD-10 Diagnosis Description L89.613 Pressure ulcer of right heel, stage 3 E11.621 Type 2 diabetes mellitus with foot ulcer I10 Essential (primary) hypertension Z96.641 Presence of right artificial hip joint Modifier: Quantity: 1 Electronic Signature(s) Signed: 08/13/2017 5:02:11 PM By: Worthy Keeler PA-C Entered By: Worthy Keeler on 08/13/2017 16:17:46

## 2017-08-18 ENCOUNTER — Other Ambulatory Visit: Payer: Self-pay | Admitting: Internal Medicine

## 2017-08-18 DIAGNOSIS — I10 Essential (primary) hypertension: Secondary | ICD-10-CM

## 2017-08-18 MED ORDER — HYDRALAZINE HCL 100 MG PO TABS: 100.0000 mg | ORAL_TABLET | Freq: Three times a day (TID) | ORAL | 11 refills | Status: DC

## 2017-08-18 NOTE — Assessment & Plan Note (Signed)
Home readings have been elevated since stopping the amlodipine.  Hydralazine was started in February 2019  At starting 50 mg tid. Readings have been elevated  To 170.    Dose was  increased to 100 mg tid  As of August 14 2017

## 2017-08-18 NOTE — Progress Notes (Signed)
TYLEA, HISE ([id number]) Visit Report for 08/13/2017 Arrival Information Details Patient Name: Maureen Morales, Maureen Morales. Date of Service: 08/13/2017 8:30 AM Medical Record Number: [id number] Patient Account Number: [id number] Date of Birth/Sex: Aug 17, 1922 (84 y.o. F) Treating RN: Montey Hora Primary Care Gerrell Tabet: Deborra Medina Other Clinician: Referring Lucky Trotta: Deborra Medina Treating Jaydeen Odor/Extender: Melburn Hake, HOYT Weeks in Treatment: 1 Visit Information History Since Last Visit Added or deleted any medications: No Patient Arrived: Wheel Chair Any new allergies or adverse reactions: No Arrival Time: 08:41 Had a fall or experienced change in No Accompanied By: dtr activities of daily living that may affect Transfer Assistance: Manual risk of falls: Patient Identification Verified: Yes Signs or symptoms of abuse/neglect since last visito No Secondary Verification Process Completed: Yes Hospitalized since last visit: No Patient Has Alerts: Yes Implantable device outside of the clinic excluding No Patient Alerts: DMII cellular tissue based products placed in the center since last visit: Has Dressing in Place as Prescribed: Yes Pain Present Now: No Electronic Signature(s) Signed: 08/13/2017 4:05:33 PM By: Montey Hora Entered By: Montey Hora on 08/13/2017 08:45:31 Maureen Morales ([id number]) -------------------------------------------------------------------------------- Clinic Level of Care Assessment Details Patient Name: Maureen Morales. Date of Service: 08/13/2017 8:30 AM Medical Record Number: [id number] Patient Account Number: [id number] Date of Birth/Sex: 24-Dec-1922 (84 y.o. F) Treating RN: Montey Hora Primary Care Wende Longstreth: Deborra Medina Other Clinician: Referring Sumayya Muha: Deborra Medina Treating Jaala Bohle/Extender: Melburn Hake, HOYT Weeks in Treatment: 1 Clinic Level of Care Assessment Items TOOL 4 Quantity Score []  - Use when only an EandM is performed on  FOLLOW-UP visit 0 ASSESSMENTS - Nursing Assessment / Reassessment []  - Reassessment of Co-morbidities (includes updates in patient status) 0 X- 1 5 Reassessment of Adherence to Treatment Plan ASSESSMENTS - Wound and Skin Assessment / Reassessment X - Simple Wound Assessment / Reassessment - one wound 1 5 []  - 0 Complex Wound Assessment / Reassessment - multiple wounds []  - 0 Dermatologic / Skin Assessment (not related to wound area) ASSESSMENTS - Focused Assessment []  - Circumferential Edema Measurements - multi extremities 0 []  - 0 Nutritional Assessment / Counseling / Intervention []  - 0 Lower Extremity Assessment (monofilament, tuning fork, pulses) []  - 0 Peripheral Arterial Disease Assessment (using hand held doppler) ASSESSMENTS - Ostomy and/or Continence Assessment and Care []  - Incontinence Assessment and Management 0 []  - 0 Ostomy Care Assessment and Management (repouching, etc.) PROCESS - Coordination of Care X - Simple Patient / Family Education for ongoing care 1 15 []  - 0 Complex (extensive) Patient / Family Education for ongoing care []  - 0 Staff obtains Programmer, systems, Records, Test Results / Process Orders []  - 0 Staff telephones HHA, Nursing Homes / Clarify orders / etc []  - 0 Routine Transfer to another Facility (non-emergent condition) []  - 0 Routine Hospital Admission (non-emergent condition) []  - 0 New Admissions / Biomedical engineer / Ordering NPWT, Apligraf, etc. []  - 0 Emergency Hospital Admission (emergent condition) X- 1 10 Simple Discharge Coordination NYOMIE, EHRLICH ([id number]) []  - 0 Complex (extensive) Discharge Coordination PROCESS - Special Needs []  - Pediatric / Minor Patient Management 0 []  - 0 Isolation Patient Management []  - 0 Hearing / Language / Visual special needs []  - 0 Assessment of Community assistance (transportation, D/C planning, etc.) []  - 0 Additional assistance / Altered mentation []  - 0 Support Surface(s)  Assessment (bed, cushion, seat, etc.) INTERVENTIONS - Wound Cleansing / Measurement X - Simple Wound Cleansing - one wound 1 5 []  - 0 Complex Wound Cleansing -  multiple wounds X- 1 5 Wound Imaging (photographs - any number of wounds) []  - 0 Wound Tracing (instead of photographs) X- 1 5 Simple Wound Measurement - one wound []  - 0 Complex Wound Measurement - multiple wounds INTERVENTIONS - Wound Dressings X - Small Wound Dressing one or multiple wounds 1 10 []  - 0 Medium Wound Dressing one or multiple wounds []  - 0 Large Wound Dressing one or multiple wounds []  - 0 Application of Medications - topical []  - 0 Application of Medications - injection INTERVENTIONS - Miscellaneous []  - External ear exam 0 []  - 0 Specimen Collection (cultures, biopsies, blood, body fluids, etc.) []  - 0 Specimen(s) / Culture(s) sent or taken to Lab for analysis []  - 0 Patient Transfer (multiple staff / Civil Service fast streamer / Similar devices) []  - 0 Simple Staple / Suture removal (25 or less) []  - 0 Complex Staple / Suture removal (26 or more) []  - 0 Hypo / Hyperglycemic Management (close monitor of Blood Glucose) []  - 0 Ankle / Brachial Index (ABI) - do not check if billed separately X- 1 5 Vital Signs Eastman, Homer H. ([id number]) Has the patient been seen at the hospital within the last three years: Yes Total Score: 65 Level Of Care: New/Established - Level 2 Electronic Signature(s) Signed: 08/13/2017 4:05:33 PM By: Montey Hora Entered By: Montey Hora on 08/13/2017 09:17:30 Maureen Morales ([id number]) -------------------------------------------------------------------------------- Encounter Discharge Information Details Patient Name: Maureen Morales. Date of Service: 08/13/2017 8:30 AM Medical Record Number: [id number] Patient Account Number: [id number] Date of Birth/Sex: [birth year] (84 y.o. F) Treating RN: Ahmed Prima Primary Care Davan Hark: Deborra Medina Other Clinician: Referring  Cortlyn Cannell: Deborra Medina Treating Treyshon Buchanon/Extender: Melburn Hake, HOYT Weeks in Treatment: 1 Encounter Discharge Information Items Discharge Pain Level: 0 Discharge Condition: Stable Ambulatory Status: Wheelchair Discharge Destination: Home Transportation: Private Auto Accompanied By: daughter Schedule Follow-up Appointment: Yes Medication Reconciliation completed and No provided to Patient/Care Orvetta Danielski: Provided on Clinical Summary of Care: 08/13/2017 Form Type Recipient Paper Patient [facility] Electronic Signature(s) Signed: 08/14/2017 3:03:21 PM By: Ruthine Dose Entered By: Ruthine Dose on 08/13/2017 09:31:26 Maureen Morales ([id number]) -------------------------------------------------------------------------------- Lower Extremity Assessment Details Patient Name: Maureen Morales. Date of Service: 08/13/2017 8:30 AM Medical Record Number: [id number] Patient Account Number: [id number] Date of Birth/Sex: [birth year] (84 y.o. F) Treating RN: Montey Hora Primary Care Khrystal Jeanmarie: Deborra Medina Other Clinician: Referring Davaun [name]: Deborra Medina Treating Danesha Kirchoff/Extender: Melburn Hake, HOYT Weeks in Treatment: 1 Vascular Assessment Pulses: Dorsalis Pedis Palpable: [Right:Yes] Posterior Tibial Extremity colors, hair growth, and conditions: Extremity Color: [Right:Hyperpigmented] Hair Growth on Extremity: [Right:No] Temperature of Extremity: [Right:Warm] Capillary Refill: [Right:< 3 seconds] Electronic Signature(s) Signed: 08/13/2017 4:05:33 PM By: Montey Hora Entered By: Montey Hora on 08/13/2017 08:52:53 Maureen Morales ([id number]) -------------------------------------------------------------------------------- Multi Wound Chart Details Patient Name: Maureen Morales. Date of Service: 08/13/2017 8:30 AM Medical Record Number: [id number] Patient Account Number: [id number] Date of Birth/Sex: Dec 10, 1922 (84 y.o. F) Treating RN: Montey Hora Primary Care Sherae Santino: Deborra Medina  Other Clinician: Referring Tekela Garguilo: Deborra Medina Treating Keina Mutch/Extender: Melburn Hake, HOYT Weeks in Treatment: 1 Vital Signs Height(in): 61 Pulse(bpm): 74 Weight(lbs): 124 Blood Pressure(mmHg): 159/50 Body Mass Index(BMI): 23 Temperature(F): 98.2 Respiratory Rate 18 (breaths/min): Photos: [1:No Photos] [N/A:N/A] Wound Location: [1:Right Calcaneus] [N/A:N/A] Wounding Event: [1:Pressure Injury] [N/A:N/A] Primary Etiology: [1:Pressure Ulcer] [N/A:N/A] Comorbid History: [1:Asthma, Coronary Artery Disease, Hypertension, Myocardial Infarction, Peripheral Venous Disease, Colitis, Type II Diabetes, Osteoarthritis, Neuropathy] [N/A:N/A] Date Acquired: [1:06/04/2017] [N/A:N/A] Weeks of Treatment: [1:1] [N/A:N/A] Wound Status: [1:Open] [N/A:N/A] Measurements L  x W x D [1:0.5x1.1x0.1] [N/A:N/A] (cm) Area (cm) : [1:0.432] [N/A:N/A] Volume (cm) : [1:0.043] [N/A:N/A] % Reduction in Area: [1:95.40%] [N/A:N/A] % Reduction in Volume: [1:95.40%] [N/A:N/A] Classification: [1:Category/Stage III] [N/A:N/A] Exudate Amount: [1:Large] [N/A:N/A] Exudate Type: [1:Serous] [N/A:N/A] Exudate Color: [1:amber] [N/A:N/A] Wound Margin: [1:Flat and Intact] [N/A:N/A] Granulation Amount: [1:Large (67-100%)] [N/A:N/A] Granulation Quality: [1:Red] [N/A:N/A] Necrotic Amount: [1:None Present (0%)] [N/A:N/A] Exposed Structures: [1:Fascia: No Fat Layer (Subcutaneous Tissue) Exposed: No Tendon: No Muscle: No Joint: No Bone: No] [N/A:N/A] Epithelialization: [1:None] [N/A:N/A] Periwound Skin Texture: [1:Excoriation: No Induration: No] [N/A:N/A] Callus: No Crepitus: No Rash: No Scarring: No Periwound Skin Moisture: Maceration: No N/A N/A Dry/Scaly: No Periwound Skin Color: Atrophie Blanche: No N/A N/A Cyanosis: No Ecchymosis: No Erythema: No Hemosiderin Staining: No Mottled: No Pallor: No Rubor: No Temperature: No Abnormality N/A N/A Tenderness on Palpation: Yes N/A N/A Wound Preparation: Ulcer  Cleansing: N/A N/A Rinsed/Irrigated with Saline Topical Anesthetic Applied: Other: lidocaine 4% Treatment Notes Electronic Signature(s) Signed: 08/13/2017 4:05:33 PM By: Montey Hora Entered By: Montey Hora on 08/13/2017 09:10:50 Maureen Morales ([id number]) -------------------------------------------------------------------------------- [place] Details Patient Name: Maureen Morales Date of Service: 08/13/2017 8:30 AM Medical Record Number: [id number] Patient Account Number: [id number] Date of Birth/Sex: 1922-12-03 (84 y.o. F) Treating RN: Montey Hora Primary Care Tennyson Wacha: Deborra Medina Other Clinician: Referring Jennaya Pogue: Deborra Medina Treating Corisa Montini/Extender: Melburn Hake, HOYT Weeks in Treatment: 1 Active Inactive ` Orientation to the Wound Care Program Nursing Diagnoses: Knowledge deficit related to the wound healing center program Goals: Patient/caregiver will verbalize understanding of the [facility] Program Date Initiated: 08/06/2017 Target Resolution Date: 08/27/2017 Goal Status: Active Interventions: Provide education on orientation to the wound center Notes: ` Wound/Skin Impairment Nursing Diagnoses: Impaired tissue integrity Goals: Patient/caregiver will verbalize understanding of skin care regimen Date Initiated: 08/06/2017 Target Resolution Date: 08/27/2017 Goal Status: Active Ulcer/skin breakdown will have a volume reduction of 30% by week 4 Date Initiated: 08/06/2017 Target Resolution Date: 08/27/2017 Goal Status: Active Interventions: Assess patient/caregiver ability to obtain necessary supplies Assess patient/caregiver ability to perform ulcer/skin care regimen upon admission and as needed Assess ulceration(s) every visit Treatment Activities: Skin care regimen initiated : 08/06/2017 Notes: Electronic Signature(s) Signed: 08/13/2017 4:05:33 PM By: Michaell Cowing, Raylene Miyamoto ([id number]) Entered By: Montey Hora on  08/13/2017 09:10:37 Maureen Morales ([id number]) -------------------------------------------------------------------------------- Pain Assessment Details Patient Name: Maureen Morales. Date of Service: 08/13/2017 8:30 AM Medical Record Number: [id number] Patient Account Number: [id number] Date of Birth/Sex: 1923-01-23 (84 y.o. F) Treating RN: Montey Hora Primary Care Kegan Shepardson: Deborra Medina Other Clinician: Referring Madina Galati: Deborra Medina Treating Habib Kise/Extender: Melburn Hake, HOYT Weeks in Treatment: 1 Active Problems Location of Pain Severity and Description of Pain Patient Has Paino No Site Locations Pain Management and Medication Current Pain Management: Notes Topical or injectable lidocaine is offered to patient for acute pain when surgical debridement is performed. If needed, Patient is instructed to use over the counter pain medication for the following 24-48 hours after debridement. Wound care MDs do not prescribed pain medications. Patient has chronic pain or uncontrolled pain. Patient has been instructed to make an appointment with their Primary Care Physician for pain management. Electronic Signature(s) Signed: 08/13/2017 4:05:33 PM By: Montey Hora Entered By: Montey Hora on 08/13/2017 08:45:43 Maureen Morales ([id number]) -------------------------------------------------------------------------------- Patient/Caregiver Education Details Patient Name: Maureen Morales Date of Service: 08/13/2017 8:30 AM Medical Record Number: [id number] Patient Account Number: [id number] Date of Birth/Gender: 01-03-1923 (84 y.o. F) Treating RN: Ahmed Prima Primary Care Physician:  Deborra Medina Other Clinician: Referring Physician: Deborra Medina Treating Physician/Extender: Sharalyn Ink in Treatment: 1 Education Assessment Education Provided To: Patient and Caregiver daughter Education Topics Provided Wound/Skin Impairment: Handouts: Caring for Your Ulcer, Other:  change dressing as ordered Methods: Demonstration, Explain/Verbal Responses: State content correctly Electronic Signature(s) Signed: 08/13/2017 4:22:27 PM By: Alric Quan Entered By: Alric Quan on 08/13/2017 09:21:34 Maureen Morales ([id number]) -------------------------------------------------------------------------------- Wound Assessment Details Patient Name: Maureen Morales. Date of Service: 08/13/2017 8:30 AM Medical Record Number: [id number] Patient Account Number: [id number] Date of Birth/Sex: November 13, 1922 (84 y.o. F) Treating RN: Montey Hora Primary Care Antar Milks: Deborra Medina Other Clinician: Referring Denina Rieger: Deborra Medina Treating Carrieanne Kleen/Extender: Melburn Hake, HOYT Weeks in Treatment: 1 Wound Status Wound Number: 1 Primary Pressure Ulcer Etiology: Wound Location: Right Calcaneus Wound Open Wounding Event: Pressure Injury Status: Date Acquired: 06/04/2017 Comorbid Asthma, Coronary Artery Disease, Hypertension, Weeks Of Treatment: 1 History: Myocardial Infarction, Peripheral Venous Clustered Wound: No Disease, Colitis, Type II Diabetes, Osteoarthritis, Neuropathy Photos Photo Uploaded By: Montey Hora on 08/13/2017 10:51:48 Wound Measurements Length: (cm) 0.5 Width: (cm) 1.1 Depth: (cm) 0.1 Area: (cm) 0.432 Volume: (cm) 0.043 % Reduction in Area: 95.4% % Reduction in Volume: 95.4% Epithelialization: None Tunneling: No Undermining: No Wound Description Classification: Category/Stage III Foul O Wound Margin: Flat and Intact Slough Exudate Amount: Large Exudate Type: Serous Exudate Color: amber dor After Cleansing: No /Fibrino Yes Wound Bed Granulation Amount: Large (67-100%) Exposed Structure Granulation Quality: Red Fascia Exposed: No Necrotic Amount: None Present (0%) Fat Layer (Subcutaneous Tissue) Exposed: No Tendon Exposed: No Muscle Exposed: No Joint Exposed: No Bone Exposed: No Nikkel, Natash H. ([id number]) Periwound Skin  Texture Texture Color No Abnormalities Noted: No No Abnormalities Noted: No Callus: No Atrophie Blanche: No Crepitus: No Cyanosis: No Excoriation: No Ecchymosis: No Induration: No Erythema: No Rash: No Hemosiderin Staining: No Scarring: No Mottled: No Pallor: No Moisture Rubor: No No Abnormalities Noted: No Dry / Scaly: No Temperature / Pain Maceration: No Temperature: No Abnormality Tenderness on Palpation: Yes Wound Preparation Ulcer Cleansing: Rinsed/Irrigated with Saline Topical Anesthetic Applied: Other: lidocaine 4%, Treatment Notes Wound #1 (Right Calcaneus) 1. Cleansed with: Clean wound with Normal Saline 2. Anesthetic Topical Lidocaine 4% cream to wound bed prior to debridement 3. Peri-wound Care: Skin Prep 4. Dressing Applied: Prisma Ag 5. Secondary Dressing Applied Bordered Foam Dressing Electronic Signature(s) Signed: 08/13/2017 4:05:33 PM By: Montey Hora Entered By: Montey Hora on 08/13/2017 08:52:31 Maureen Morales ([id number]) -------------------------------------------------------------------------------- [place] Details Patient Name: Maureen Morales. Date of Service: 08/13/2017 8:30 AM Medical Record Number: [id number] Patient Account Number: [id number] Date of Birth/Sex: 08/20/1922 (84 y.o. F) Treating RN: Montey Hora Primary Care Jarel Cuadra: Deborra Medina Other Clinician: Referring Merridy Pascoe: Deborra Medina Treating Dennard Vezina/Extender: Melburn Hake, HOYT Weeks in Treatment: 1 Vital Signs Time Taken: 08:45 Temperature (F): 98.2 Height (in): 61 Pulse (bpm): 74 Weight (lbs): 124 Respiratory Rate (breaths/min): 18 Body Mass Index (BMI): 23.4 Blood Pressure (mmHg): 159/50 Reference Range: 80 - 120 mg / dl Electronic Signature(s) Signed: 08/13/2017 4:05:33 PM By: Montey Hora Entered By: Montey Hora on 08/13/2017 08:46:06

## 2017-08-20 ENCOUNTER — Encounter: Payer: Medicare Other | Admitting: Physician Assistant

## 2017-08-20 ENCOUNTER — Other Ambulatory Visit: Payer: Self-pay | Admitting: Internal Medicine

## 2017-08-20 DIAGNOSIS — E11621 Type 2 diabetes mellitus with foot ulcer: Secondary | ICD-10-CM | POA: Diagnosis not present

## 2017-08-20 DIAGNOSIS — L89613 Pressure ulcer of right heel, stage 3: Secondary | ICD-10-CM | POA: Diagnosis not present

## 2017-08-20 DIAGNOSIS — E1140 Type 2 diabetes mellitus with diabetic neuropathy, unspecified: Secondary | ICD-10-CM | POA: Diagnosis not present

## 2017-08-20 DIAGNOSIS — E1151 Type 2 diabetes mellitus with diabetic peripheral angiopathy without gangrene: Secondary | ICD-10-CM | POA: Diagnosis not present

## 2017-08-20 DIAGNOSIS — Z96641 Presence of right artificial hip joint: Secondary | ICD-10-CM | POA: Diagnosis not present

## 2017-08-20 DIAGNOSIS — I10 Essential (primary) hypertension: Secondary | ICD-10-CM | POA: Diagnosis not present

## 2017-08-20 DIAGNOSIS — L97419 Non-pressure chronic ulcer of right heel and midfoot with unspecified severity: Secondary | ICD-10-CM | POA: Diagnosis not present

## 2017-08-21 ENCOUNTER — Other Ambulatory Visit: Payer: Self-pay | Admitting: Internal Medicine

## 2017-08-21 DIAGNOSIS — M9701XD Periprosthetic fracture around internal prosthetic right hip joint, subsequent encounter: Secondary | ICD-10-CM | POA: Diagnosis not present

## 2017-08-21 DIAGNOSIS — E1151 Type 2 diabetes mellitus with diabetic peripheral angiopathy without gangrene: Secondary | ICD-10-CM | POA: Diagnosis not present

## 2017-08-21 DIAGNOSIS — L89612 Pressure ulcer of right heel, stage 2: Secondary | ICD-10-CM | POA: Diagnosis not present

## 2017-08-21 DIAGNOSIS — Z96641 Presence of right artificial hip joint: Secondary | ICD-10-CM | POA: Diagnosis not present

## 2017-08-21 DIAGNOSIS — I252 Old myocardial infarction: Secondary | ICD-10-CM | POA: Diagnosis not present

## 2017-08-21 DIAGNOSIS — I2510 Atherosclerotic heart disease of native coronary artery without angina pectoris: Secondary | ICD-10-CM | POA: Diagnosis not present

## 2017-08-21 MED ORDER — AMLODIPINE BESYLATE 2.5 MG PO TABS: 2.5000 mg | ORAL_TABLET | Freq: Every day | ORAL | 3 refills | Status: DC

## 2017-08-21 NOTE — Telephone Encounter (Signed)
Medication refill request stated that medication was to be discontinued at discharge. Is pt supposed to still be taking the Fenofibrate?

## 2017-08-22 NOTE — Telephone Encounter (Signed)
PLEASE LET MS Baillie KNOW THAT WE ARE SUSPENDING THIS MEDIATION FOR A MONTH TO  SEE IF THE DIARRHEA RESOLVES

## 2017-08-22 NOTE — Telephone Encounter (Signed)
Spoke with pt's daughter [name] and informed them that Dr. Derrel Nip would like for her to stop taking the fenofibrate for at least a month to see if it will help to resolve the diarrhea. [name] gave a verbal understanding.

## 2017-08-23 ENCOUNTER — Encounter: Payer: Medicare Other | Attending: Physical Medicine & Rehabilitation | Admitting: Physical Medicine & Rehabilitation

## 2017-08-23 ENCOUNTER — Encounter: Payer: Self-pay | Admitting: Physical Medicine & Rehabilitation

## 2017-08-23 VITALS — BP 158/72 | HR 72 | Resp 14 | Ht 59.5 in | Wt 132.0 lb

## 2017-08-23 DIAGNOSIS — E785 Hyperlipidemia, unspecified: Secondary | ICD-10-CM | POA: Diagnosis not present

## 2017-08-23 DIAGNOSIS — I119 Hypertensive heart disease without heart failure: Secondary | ICD-10-CM | POA: Diagnosis not present

## 2017-08-23 DIAGNOSIS — J45909 Unspecified asthma, uncomplicated: Secondary | ICD-10-CM | POA: Insufficient documentation

## 2017-08-23 DIAGNOSIS — M9701XS Periprosthetic fracture around internal prosthetic right hip joint, sequela: Secondary | ICD-10-CM

## 2017-08-23 DIAGNOSIS — I350 Nonrheumatic aortic (valve) stenosis: Secondary | ICD-10-CM | POA: Insufficient documentation

## 2017-08-23 DIAGNOSIS — Z96641 Presence of right artificial hip joint: Secondary | ICD-10-CM | POA: Diagnosis not present

## 2017-08-23 DIAGNOSIS — I252 Old myocardial infarction: Secondary | ICD-10-CM | POA: Diagnosis not present

## 2017-08-23 DIAGNOSIS — I10 Essential (primary) hypertension: Secondary | ICD-10-CM | POA: Diagnosis not present

## 2017-08-23 DIAGNOSIS — S0003XA Contusion of scalp, initial encounter: Secondary | ICD-10-CM | POA: Diagnosis not present

## 2017-08-23 DIAGNOSIS — F329 Major depressive disorder, single episode, unspecified: Secondary | ICD-10-CM | POA: Diagnosis not present

## 2017-08-23 DIAGNOSIS — H9192 Unspecified hearing loss, left ear: Secondary | ICD-10-CM | POA: Insufficient documentation

## 2017-08-23 DIAGNOSIS — I2510 Atherosclerotic heart disease of native coronary artery without angina pectoris: Secondary | ICD-10-CM | POA: Insufficient documentation

## 2017-08-23 DIAGNOSIS — E1151 Type 2 diabetes mellitus with diabetic peripheral angiopathy without gangrene: Secondary | ICD-10-CM | POA: Diagnosis not present

## 2017-08-23 DIAGNOSIS — R269 Unspecified abnormalities of gait and mobility: Secondary | ICD-10-CM | POA: Insufficient documentation

## 2017-08-23 DIAGNOSIS — Z9049 Acquired absence of other specified parts of digestive tract: Secondary | ICD-10-CM | POA: Insufficient documentation

## 2017-08-23 DIAGNOSIS — G8918 Other acute postprocedural pain: Secondary | ICD-10-CM

## 2017-08-23 DIAGNOSIS — R600 Localized edema: Secondary | ICD-10-CM

## 2017-08-23 DIAGNOSIS — K219 Gastro-esophageal reflux disease without esophagitis: Secondary | ICD-10-CM | POA: Insufficient documentation

## 2017-08-23 DIAGNOSIS — L89610 Pressure ulcer of right heel, unstageable: Secondary | ICD-10-CM | POA: Diagnosis not present

## 2017-08-23 DIAGNOSIS — L97419 Non-pressure chronic ulcer of right heel and midfoot with unspecified severity: Secondary | ICD-10-CM | POA: Insufficient documentation

## 2017-08-23 DIAGNOSIS — S72001A Fracture of unspecified part of neck of right femur, initial encounter for closed fracture: Secondary | ICD-10-CM

## 2017-08-23 DIAGNOSIS — K603 Anal fistula: Secondary | ICD-10-CM | POA: Diagnosis not present

## 2017-08-23 DIAGNOSIS — M9701XA Periprosthetic fracture around internal prosthetic right hip joint, initial encounter: Secondary | ICD-10-CM | POA: Insufficient documentation

## 2017-08-23 NOTE — Patient Instructions (Signed)
Please try over the counter Lidoderm patch

## 2017-08-23 NOTE — Progress Notes (Signed)
Subjective:    Patient ID: Maureen Morales, female    DOB: Sep 25, 1922, 84 y.o.   MRN: [id number]  HPI 84 y.o. female with history of CAD, HTN, right hip hemiarthroplasty (hip precautions) presents for follow up for for periprosthetic femur fracture and scalp hematoma.  Last clinic visit 07/18/17.  Presents with daughters, who provide 33 of history. Since that time, pt had b/l dopplers ordered by Surgery, notes reviewed, which did not show DVT. She is still in therapies.  She saw Ortho and is now WBAT, notes reviewed. She has persistent pain.  Denies falls. She is now using walker. She had some drainage from her heel ulcer, but that has resolved.  Pain Inventory Average Pain 7 Pain Right Now 4 My pain is intermittent and dull  In the last 24 hours, has pain interfered with the following? General activity 6 Relation with others 5 Enjoyment of life 8 What TIME of day is your pain at its worst? varies Sleep (in general) Good  Pain is worse with: walking and sitting Pain improves with: rest, therapy/exercise and medication Relief from Meds: 9  Mobility walk with assistance use a walker how many minutes can you walk? 15 ability to climb steps?  no do you drive?  no use a wheelchair needs help with transfers  Function not employed: date last employed . I need assistance with the following:  dressing, bathing, toileting, meal prep, household duties and shopping  Neuro/Psych bowel control problems trouble walking  Prior Studies Any changes since last visit?  no  Physicians involved in your care Any changes since last visit?  no   Family History  Problem Relation Age of Onset  . Breast cancer Mother 26  . Colon cancer Father   . Stroke Sister   . Cancer Mother   . Breast cancer Sister 51  . Colon cancer Brother   . Colon cancer Brother    Social History   Socioeconomic History  . Marital status: Married    Spouse name: [name]  . Number of children: 4  .  Years of education: 19  . Highest education level: High school graduate  Occupational History  . Not on file  Social Needs  . Financial resource strain: Not on file  . Food insecurity:    Worry: Not on file    Inability: Not on file  . Transportation needs:    Medical: Not on file    Non-medical: Not on file  Tobacco Use  . Smoking status: Never Smoker  . Smokeless tobacco: Never Used  Substance and Sexual Activity  . Alcohol use: No    Frequency: Never  . Drug use: No  . Sexual activity: Never  Lifestyle  . Physical activity:    Days per week: Not on file    Minutes per session: Not on file  . Stress: Not on file  Relationships  . Social connections:    Talks on phone: Not on file    Gets together: Not on file    Attends religious service: Not on file    Active member of club or organization: Not on file    Attends meetings of clubs or organizations: Not on file    Relationship status: Not on file  Other Topics Concern  . Not on file  Social History Narrative   ** Merged History Encounter **       Married 4 children Never smoker Denies alcohol use Full Code   Past Surgical History:  Procedure Laterality Date  . ABDOMINAL HYSTERECTOMY    . BLADDER SURGERY    . CARDIAC CATHETERIZATION    . CHOLECYSTECTOMY    . COLONOSCOPY     06/16/1987, 02/20/1995, 05/04/1999, 02/14/2005, 05/17/2007  . ESOPHAGOGASTRODUODENOSCOPY     05/11/1987, 06/13/1995, 10/09/1995, 05/04/1999, 01/16/2002  . FLEXIBLE SIGMOIDOSCOPY N/A 05/02/2017   Procedure: FLEXIBLE SIGMOIDOSCOPY;  Surgeon: Manya Silvas, MD;  Location: [facility];  Service: Endoscopy;  Laterality: N/A;  . HEMORRHOID SURGERY N/A 05/14/2017   Procedure: EXTERNAL THROMBOSED HEMORRHOIDECTOMY;  Surgeon: Robert Bellow, MD;  Location: [facility] ORS;  Service: General;  Laterality: N/A;  . HIP ARTHROPLASTY Right 02/17/2017   Procedure: ARTHROPLASTY BIPOLAR HIP (HEMIARTHROPLASTY);  Surgeon: Claud Kelp, MD;  Location: [facility]  ORS;  Service: Orthopedics;  Laterality: Right;  . LEFT HEART CATH AND CORONARY ANGIOGRAPHY N/A 08/31/2016   Procedure: Left Heart Cath and Coronary Angiography;  Surgeon: Wellington Hampshire, MD;  Location: [facility];  Service: Cardiovascular;  Laterality: N/A;  . ORIF PERIPROSTHETIC FRACTURE Right 05/27/2017  . ORIF PERIPROSTHETIC FRACTURE Right 05/27/2017   Procedure: OPEN REDUCTION INTERNAL FIXATION (ORIF) PERIPROSTHETIC FRACTURE;  Surgeon: Mcarthur Rossetti, MD;  Location: [place];  Service: Orthopedics;  Laterality: Right;  . TOTAL HIP ARTHROPLASTY Right   . Theadora Rama prolapse     Past Medical History:  Diagnosis Date  . Asthma without status asthmaticus    unspecified  . CAD (coronary artery disease)   . Colitis    unspecified  . Complication of anesthesia    sensitive to anesthesia  . Depression   . Diabetes mellitus   . Diabetes mellitus type 2, uncomplicated ([place])   . Essential hypertension   . GERD (gastroesophageal reflux disease)   . 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 HLD (hyperlipidemia)   . Hyperlipidemia    unspecified  . Hypertension   . Myocardial infarction ([place]) 2018  . Peripheral vascular disease ([place])   . Positive H. pylori test   . Viral gastroenteritis due to [place]-like agent Nov 2012   BP (!) 158/72   Pulse 72   Resp 14   Ht 4' 11.5" (1.511 m)   Wt 132 lb (59.9 kg)   SpO2 96%   BMI 26.21 kg/m   Opioid Risk Score:   Fall Risk Score:  `1  Depression screen PHQ 2/9  Depression screen [facility] 2/9 05/22/2015 07/08/2014 01/27/2013  Decreased Interest 0 0 0  Down, Depressed, Hopeless 0 0 0  PHQ - 2 Score 0 0 0  Some recent data might be hidden     Review of Systems  Constitutional: Negative.   HENT: Negative.   Eyes: Negative.   Respiratory: Positive for wheezing.   Cardiovascular: Negative.   Gastrointestinal: Positive for diarrhea.  Endocrine: Negative.        High blood sugar  Genitourinary: Negative.     Musculoskeletal: Positive for arthralgias, gait problem and joint swelling.  Skin: Negative.   Allergic/Immunologic: Negative.   Hematological: Negative.   Psychiatric/Behavioral: Negative.   All other systems reviewed and are negative.     Objective:   Physical Exam Constitutional: She appears well-developed and well-nourished. No distress.  HENT: Normocephalic and atraumatic.  Eyes: EOM are normal. No discharge.  Cardiovascular: RRR. Murmur heard. No JVD. Respiratory: Effort normal.  Clear.  GI: Bowel sounds are normal. She exhibits no distension.  Musculoskeletal:  B/l foot edema Neurological: She is alert and oriented.  Motor: B/l UE: 4+/5  proximal to distal RLE: HF, 4-4+/5, ADF/PF 4/5  LLE: HF, KE 4+/5, ADF/PF 5/5  Skin: Right heel with eschar, non-fluctuant.   Psychiatric: She has a normal mood and affect. Her behavior is normal.     Assessment & Plan:  84 y.o. female with history of CAD, HTN, right hip hemiarthroplasty (hip precautions) presents for follow up for periprosthetic femur fracture and scalp hematoma.  1.  Deficits with mobility, transfers, and self-care secondary to right peri-prosthetic hip fracture.   Cont therapies   Cont follow up with Ortho   Now WBAT  2. Pain Management  Controlled at present  Cont ice  Encourage Lidoderm patch  3. Right heel ulcer  Cont prevalon boot qhs.   Cont dressing changes  4. Gait abnormality  Cont walker for safety  Cont therapies  5. B/l foot edema  U/S neg for DVT  Encouraged elevated  Encouraged low salt diet, pt states she is not willing to change her diet  6. HTN  Elevated, daughter states medications increased this week  Cont to follow up with PCP

## 2017-08-24 DIAGNOSIS — E1151 Type 2 diabetes mellitus with diabetic peripheral angiopathy without gangrene: Secondary | ICD-10-CM | POA: Diagnosis not present

## 2017-08-24 DIAGNOSIS — Z96641 Presence of right artificial hip joint: Secondary | ICD-10-CM | POA: Diagnosis not present

## 2017-08-24 DIAGNOSIS — I252 Old myocardial infarction: Secondary | ICD-10-CM | POA: Diagnosis not present

## 2017-08-24 DIAGNOSIS — I2510 Atherosclerotic heart disease of native coronary artery without angina pectoris: Secondary | ICD-10-CM | POA: Diagnosis not present

## 2017-08-24 DIAGNOSIS — M9701XD Periprosthetic fracture around internal prosthetic right hip joint, subsequent encounter: Secondary | ICD-10-CM | POA: Diagnosis not present

## 2017-08-24 DIAGNOSIS — L89612 Pressure ulcer of right heel, stage 2: Secondary | ICD-10-CM | POA: Diagnosis not present

## 2017-08-25 NOTE — Progress Notes (Signed)
SHELINA, LUO ([id number]) Visit Report for 08/20/2017 Chief Complaint Document Details Patient Name: Maureen Morales, Maureen Morales. Date of Service: 08/20/2017 8:30 AM Medical Record Number: [id number] Patient Account Number: [id number] Date of Birth/Sex: 10-Feb-1923 (84 y.o. F) Treating RN: Roger Shelter Primary Care Provider: Deborra Medina Other Clinician: Referring Provider: Deborra Medina Treating Provider/Extender: Melburn Hake, HOYT Weeks in Treatment: 2 Information Obtained from: Patient Chief Complaint Right heel ulcer Electronic Signature(s) Signed: 08/20/2017 7:34:23 PM By: Worthy Keeler PA-C Entered By: Worthy Keeler on 08/20/2017 09:19:42 Maureen Morales ([id number]) -------------------------------------------------------------------------------- HPI Details Patient Name: Maureen Morales Date of Service: 08/20/2017 8:30 AM Medical Record Number: [id number] Patient Account Number: [id number] Date of Birth/Sex: 06-29-1922 (84 y.o. 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 This developed when she was in the hospital over a period of 3-4 days at the end of January for a right hip replacement. She unfortunately had fallen and fractured her hip. With that being said the patient seems to have done very well in that regard and is showing signs of getting better as far as her therapy is concerned. Unfortunately the heel ulcer which she developed while she was in the hospital seems to have continued to give her trouble and she did have eschar  still covering when she arrived today. With that being said this was subsequently removed today just during cleansing of the heel prior to me entering the room popped right off. Underline there appears to be a very well healing area of ulceration which is very superficial and scattered but there does not appear to be any evidence of infection which is good news. She has good epithelialization. No fevers, chills, nausea, or vomiting noted at this time. Patient does have discomfort she is currently undergoing physical therapy though they are doing partial weight bearing only utilizing the walker I think that is appropriate. I do not want her fully weight-bearing on this area yet. She does have swelling of the bilateral lower extremities this seems to be secondary to the hydralazine which has been increased recently according to her doctor. 08/13/17 on evaluation today patient's heel ulcer actually appears to be doing excellent at this time. She has been tolerating the dressing changes without complication the Prisma seems to be agreeing with her. Overall there's no evidence of infection and I do believe that she has been taking care of the wound very nicely over the past week since I last saw her. Patient does complain about having some pain along the plantar surface of the foot this was further evaluated today as well briefly. 08/20/17 on evaluation today patient appears to be doing very well in regard to her ulcer. She has been tolerating the dressing changes without complication and in fact her wound appears to be completely healed today. Electronic Signature(s) Signed: 08/20/2017 7:34:23 PM By: Worthy Keeler PA-C Entered By: Worthy Keeler on 08/20/2017 18:45:07 Maureen Morales ([id number]) -------------------------------------------------------------------------------- Physical Exam Details Patient Name: Maureen Morales Date of Service: 08/20/2017 8:30 AM Medical Record Number:  [id number] Patient Account Number: [id number] Date of Birth/Sex: 20-May-1922 (84 y.o. F) Treating RN: Roger Shelter Primary Care  Provider: Deborra Medina Other Clinician: Referring Provider: Deborra Medina Treating Provider/Extender: STONE III, HOYT Weeks in Treatment: 2 Constitutional Well-nourished and well-hydrated in no acute distress. Psychiatric this patient is able to make decisions and demonstrates good insight into disease process. Alert and Oriented x 3. pleasant and cooperative. Notes There are no openings on the heel currently to be of complication a problem for the patient which is good news. Do think she's to protect this to some degree of the next couple of weeks just to be safe. Electronic Signature(s) Signed: 08/20/2017 7:34:23 PM By: Worthy Keeler PA-C Entered By: Worthy Keeler on 08/20/2017 18:45:38 Maureen Morales ([id number]) -------------------------------------------------------------------------------- Physician Orders Details Patient Name: Maureen Morales Date of Service: 08/20/2017 8:30 AM Medical Record Number: [id number] Patient Account Number: [id number] Date of Birth/Sex: 03-03-1923 (84 y.o. F) Treating RN: Roger Shelter Primary Care Provider: Deborra Medina Other Clinician: Referring Provider: Deborra Medina Treating Provider/Extender: Melburn Hake, HOYT Weeks in Treatment: 2 Verbal / Phone Orders: No Diagnosis Coding ICD-10 Coding Code Description [phone] Pressure ulcer of right heel, stage 3 E11.621 Type 2 diabetes mellitus with foot ulcer I10 Essential (primary) hypertension Z96.641 Presence of right artificial hip joint Wound Cleansing o Clean wound with Normal Saline. Home Health o D/C [facility] - discharge wound care for home health Discharge From [facility] Services o Discharge from [place] - treatment complete Notes heel cup for protection Electronic Signature(s) Signed: 08/20/2017 4:40:07 PM By: Roger Shelter Signed: 08/20/2017 7:34:23 PM By: Worthy Keeler PA-C Entered By: Roger Shelter on 08/20/2017 13:39:31 Maureen Morales ([id number]) -------------------------------------------------------------------------------- Problem List Details Patient Name: Maureen Morales. Date of Service: 08/20/2017 8:30 AM Medical Record Number: [id number] Patient Account Number: [id number] Date of Birth/Sex: [birth year] (84 y.o. F) Treating RN: Roger Shelter Primary Care Provider: Deborra Medina Other Clinician: Referring Provider: Deborra Medina Treating Provider/Extender: Melburn Hake, HOYT Weeks in Treatment: 2 Active Problems ICD-10 Impacting Encounter Code Description Active Date Wound Healing Diagnosis L89.613 Pressure ulcer of right heel, stage 3 08/06/2017 Yes E11.621 Type 2 diabetes mellitus with foot ulcer 08/06/2017 Yes I10 Essential (primary) hypertension 08/06/2017 Yes Z96.641 Presence of right artificial hip joint 08/06/2017 Yes Inactive Problems Resolved Problems Electronic Signature(s) Signed: 08/20/2017 7:34:23 PM By: Worthy Keeler PA-C Entered By: Worthy Keeler on 08/20/2017 09:19:34 Maureen Morales ([id number]) -------------------------------------------------------------------------------- Progress Note Details Patient Name: Maureen Morales. Date of Service: 08/20/2017 8:30 AM Medical Record Number: [id number] Patient Account Number: [id number] Date of Birth/Sex: 09-28-1922 (84 y.o. F) Treating RN: Roger Shelter Primary Care Provider: Deborra Medina Other Clinician: Referring Provider: Deborra Medina Treating Provider/Extender: Melburn Hake, HOYT Weeks in Treatment: 2 Subjective Chief Complaint Information obtained from Patient Right heel ulcer History of Present Illness (HPI) The following HPI elements were documented for the patient's wound: Associated Signs and Symptoms: Patient has a history of diabetes mellitus type II, hypertension, right hip replacement secondary to hip  fracture 08/06/17 on evaluation today patient actually presents for evaluation and our clinic concerning her right heel ulcer. This developed when she was in the hospital over a period of 3-4 days at the end of January for a right hip replacement. She unfortunately had fallen and fractured her hip. With that being said the patient seems to have done very well in that regard and is showing signs of getting better as far as her therapy is concerned. Unfortunately the heel ulcer which she developed while she was in the hospital seems to have continued to  give her trouble and she did have eschar still covering when she arrived today. With that being said this was subsequently removed today just during cleansing of the heel prior to me entering the room popped right off. Underline there appears to be a very well healing area of ulceration which is very superficial and scattered but there does not appear to be any evidence of infection which is good news. She has good epithelialization. No fevers, chills, nausea, or vomiting noted at this time. Patient does have discomfort she is currently undergoing physical therapy though they are doing partial weight bearing only utilizing the walker I think that is appropriate. I do not want her fully weight-bearing on this area yet. She does have swelling of the bilateral lower extremities this seems to be secondary to the hydralazine which has been increased recently according to her doctor. 08/13/17 on evaluation today patient's heel ulcer actually appears to be doing excellent at this time. She has been tolerating the dressing changes without complication the Prisma seems to be agreeing with her. Overall there's no evidence of infection and I do believe that she has been taking care of the wound very nicely over the past week since I last saw her. Patient does complain about having some pain along the plantar surface of the foot this was further evaluated today as  well briefly. 08/20/17 on evaluation today patient appears to be doing very well in regard to her ulcer. She has been tolerating the dressing changes without complication and in fact her wound appears to be completely healed today. Patient History Information obtained from Patient. Family History Cancer - Siblings,Mother, Heart Disease - Father, Hypertension - Father, No family history of Diabetes, Hereditary Spherocytosis, Kidney Disease, Lung Disease, Seizures, Stroke, Thyroid Problems, Tuberculosis. Social History Never smoker, Marital Status - Married, Alcohol Use - Never, Drug Use - No History, Caffeine Use - Daily. Medical And Surgical History Notes Ear/Nose/Mouth/Throat [facility] Gastrointestinal ESTHA, FEW ([id number]) GERD Review of Systems (ROS) Constitutional Symptoms (General Health) Denies complaints or symptoms of Fever, Chills. Psychiatric The patient has no complaints or symptoms. Objective Constitutional Well-nourished and well-hydrated in no acute distress. Vitals Time Taken: 8:46 AM, Height: 61 in, Weight: 124 lbs, BMI: 23.4, Temperature: 97.7 F, Pulse: 72 bpm, Respiratory Rate: 18 breaths/min, Blood Pressure: 155/53 mmHg. Psychiatric this patient is able to make decisions and demonstrates good insight into disease process. Alert and Oriented x 3. pleasant and cooperative. General Notes: There are no openings on the heel currently to be of complication a problem for the patient which is good news. Do think she's to protect this to some degree of the next couple of weeks just to be safe. Integumentary (Hair, Skin) Wound #1 status is Open. Original cause of wound was Pressure Injury. The wound is located on the Right Calcaneus. The wound measures 0cm length x 0cm width x 0cm depth; 0cm^2 area and 0cm^3 volume. Assessment Active Problems ICD-10 L89.613 - Pressure ulcer of right heel, stage 3 E11.621 - Type 2 diabetes mellitus with foot ulcer I10 - Essential  (primary) hypertension Z96.641 - Presence of right artificial hip joint Plan Wound Cleansing: KEYUNNA, COCO. ([id number]) Clean wound with Normal Saline. Home Health: D/C [place] - discharge wound care for home health Discharge From [facility] Services: Discharge from [place] - treatment complete General Notes: heel cup for protection I'm gonna suggest discontinue wound care services at this time. Unfortunately the patient is in agreement with this plan and  seems to be doing excellent. We will reevaluate her in the future as needed if anything changes or worsens. Electronic Signature(s) Signed: 08/20/2017 7:34:23 PM By: Worthy Keeler PA-C Entered By: Worthy Keeler on 08/20/2017 18:45:59 Maureen Morales ([id number]) -------------------------------------------------------------------------------- ROS/PFSH Details Patient Name: Maureen Morales Date of Service: 08/20/2017 8:30 AM Medical Record Number: [id number] Patient Account Number: [id number] Date of Birth/Sex: January 15, 1923 (84 y.o. F) Treating RN: Roger Shelter Primary Care Provider: Deborra Medina Other Clinician: Referring Provider: Deborra Medina Treating Provider/Extender: Melburn Hake, HOYT Weeks in Treatment: 2 Information Obtained From Patient Wound History Do you currently have one or more open woundso Yes How many open wounds do you currently haveo 1 Approximately how long have you had your woundso 2 1/2 months How have you been treating your wound(s) until nowo foam bandage Has your wound(s) ever healed and then re-openedo No Have you had any lab work done in the past montho No Have you tested positive for an antibiotic resistant organism (MRSA, VRE)o No Have you tested positive for osteomyelitis (bone infection)o No Have you had any tests for circulation on your legso No Constitutional Symptoms (General Health) Complaints and Symptoms: Negative for: Fever; Chills Ear/Nose/Mouth/Throat Medical  History: Past Medical History Notes: HOH Respiratory Medical History: Positive for: Asthma Negative for: Aspiration; Chronic Obstructive Pulmonary Disease (COPD); Pneumothorax; Sleep Apnea; Tuberculosis Cardiovascular Medical History: Positive for: Coronary Artery Disease; Hypertension; Myocardial Infarction - 2018; Peripheral Venous Disease Negative for: Angina; Arrhythmia; Congestive Heart Failure; Deep Vein Thrombosis; Hypotension; Peripheral Arterial Disease; Phlebitis; Vasculitis Gastrointestinal Medical History: Positive for: Colitis Negative for: Cirrhosis ; Crohnos; Hepatitis A; Hepatitis B; Hepatitis C Past Medical History Notes: GERD Endocrine Medical History: Positive for: Type II Diabetes - 7.0 A1C Zentner, Doylene H. ([id number]) Treated with: Oral agents Immunological Medical History: Negative for: Lupus Erythematosus; Raynaudos; Scleroderma Musculoskeletal Medical History: Positive for: Osteoarthritis Negative for: Gout; Rheumatoid Arthritis; Osteomyelitis Neurologic Medical History: Positive for: Neuropathy Negative for: Dementia Oncologic Medical History: Negative for: Received Chemotherapy; Received Radiation Psychiatric Complaints and Symptoms: No Complaints or Symptoms Immunizations Pneumococcal Vaccine: Received Pneumococcal Vaccination: Yes Immunization Notes: up to date Implantable Devices Family and Social History Cancer: Yes - Siblings,Mother; Diabetes: No; Heart Disease: Yes - Father; Hereditary Spherocytosis: No; Hypertension: Yes - Father; Kidney Disease: No; Lung Disease: No; Seizures: No; Stroke: No; Thyroid Problems: No; Tuberculosis: No; Never smoker; Marital Status - Married; Alcohol Use: Never; Drug Use: No History; Caffeine Use: Daily; Financial Concerns: No; Food, Clothing or Shelter Needs: No; Support System Lacking: No; Transportation Concerns: No; Advanced Directives: No; Patient does not want information on Advanced  Directives Physician Affirmation I have reviewed and agree with the above information. Electronic Signature(s) Signed: 08/20/2017 7:34:23 PM By: Worthy Keeler PA-C Signed: 08/21/2017 4:51:25 PM By: Roger Shelter Entered By: Worthy Keeler on 08/20/2017 18:45:21 Maureen Morales ([id number]) -------------------------------------------------------------------------------- SuperBill Details Patient Name: Maureen Morales Date of Service: 08/20/2017 Medical Record Number: [id number] Patient Account Number: [id number] Date of Birth/Sex: 07-Feb-1923 (84 y.o. F) Treating RN: Roger Shelter Primary Care Provider: Deborra Medina Other Clinician: Referring Provider: Deborra Medina Treating Provider/Extender: Melburn Hake, HOYT Weeks in Treatment: 2 Diagnosis Coding ICD-10 Codes Code Description [id number] Pressure ulcer of right heel, stage 3 E11.621 Type 2 diabetes mellitus with foot ulcer I10 Essential (primary) hypertension Z96.641 Presence of right artificial hip joint Facility Procedures CPT4 Code: [id number] Description: 76283 - WOUND CARE VISIT-LEV 2 EST PT Modifier: Quantity: 1 Physician Procedures CPT4 Code: [id number] Description: 07371 - WC PHYS  LEVEL 2 - NEW PT ICD-10 Diagnosis Description L89.613 Pressure ulcer of right heel, stage 3 E11.621 Type 2 diabetes mellitus with foot ulcer I10 Essential (primary) hypertension Z96.641 Presence of right artificial hip joint Modifier: Quantity: 1 Electronic Signature(s) Signed: 08/20/2017 7:34:23 PM By: Worthy Keeler PA-C Entered By: Worthy Keeler on 08/20/2017 18:46:25

## 2017-08-27 DIAGNOSIS — I2510 Atherosclerotic heart disease of native coronary artery without angina pectoris: Secondary | ICD-10-CM | POA: Diagnosis not present

## 2017-08-27 DIAGNOSIS — I252 Old myocardial infarction: Secondary | ICD-10-CM | POA: Diagnosis not present

## 2017-08-27 DIAGNOSIS — Z96641 Presence of right artificial hip joint: Secondary | ICD-10-CM | POA: Diagnosis not present

## 2017-08-27 DIAGNOSIS — L89612 Pressure ulcer of right heel, stage 2: Secondary | ICD-10-CM | POA: Diagnosis not present

## 2017-08-27 DIAGNOSIS — M9701XD Periprosthetic fracture around internal prosthetic right hip joint, subsequent encounter: Secondary | ICD-10-CM | POA: Diagnosis not present

## 2017-08-27 DIAGNOSIS — E1151 Type 2 diabetes mellitus with diabetic peripheral angiopathy without gangrene: Secondary | ICD-10-CM | POA: Diagnosis not present

## 2017-08-27 NOTE — Progress Notes (Signed)
SHONYA, SUMIDA ([id number]) Visit Report for 08/20/2017 Arrival Information Details Patient Name: Maureen Morales, Maureen Morales. Date of Service: 08/20/2017 8:30 AM Medical Record Number: [id number] Patient Account Number: [id number] Date of Birth/Sex: May 22, 1922 (84 y.o. F) Treating RN: Montey Hora Primary Care Terryn Rosenkranz: Deborra Medina Other Clinician: Referring Ivee Poellnitz: Deborra Medina Treating Domique Reardon/Extender: Melburn Hake, HOYT Weeks in Treatment: 2 Visit Information History Since Last Visit Added or deleted any medications: No Patient Arrived: Walker Any new allergies or adverse reactions: No Arrival Time: 08:45 Had a fall or experienced change in No Accompanied By: dtr activities of daily living that may affect Transfer Assistance: None risk of falls: Patient Identification Verified: Yes Signs or symptoms of abuse/neglect since last visito No Secondary Verification Process Completed: Yes Hospitalized since last visit: No Patient Has Alerts: Yes Implantable device outside of the clinic excluding No Patient Alerts: DMII cellular tissue based products placed in the center since last visit: Has Dressing in Place as Prescribed: Yes Pain Present Now: No Electronic Signature(s) Signed: 08/20/2017 4:40:07 PM By: Roger Shelter Entered By: Roger Shelter on 08/20/2017 09:24:08 Maureen Morales ([id number]) -------------------------------------------------------------------------------- Clinic Level of Care Assessment Details Patient Name: Maureen Morales. Date of Service: 08/20/2017 8:30 AM Medical Record Number: [id number] Patient Account Number: [id number] Date of Birth/Sex: [birth year] (84 y.o. F) Treating RN: Roger Shelter Primary Care Tamantha Saline: Deborra Medina Other Clinician: Referring Lynard Postlewait: Deborra Medina Treating Deavion Strider/Extender: Melburn Hake, HOYT Weeks in Treatment: 2 Clinic Level of Care Assessment Items TOOL 4 Quantity Score X - Use when only an EandM is performed on  FOLLOW-UP visit 1 0 ASSESSMENTS - Nursing Assessment / Reassessment X - Reassessment of Co-morbidities (includes updates in patient status) 1 10 X- 1 5 Reassessment of Adherence to Treatment Plan ASSESSMENTS - Wound and Skin Assessment / Reassessment X - Simple Wound Assessment / Reassessment - one wound 1 5 []  - 0 Complex Wound Assessment / Reassessment - multiple wounds []  - 0 Dermatologic / Skin Assessment (not related to wound area) ASSESSMENTS - Focused Assessment []  - Circumferential Edema Measurements - multi extremities 0 []  - 0 Nutritional Assessment / Counseling / Intervention []  - 0 Lower Extremity Assessment (monofilament, tuning fork, pulses) []  - 0 Peripheral Arterial Disease Assessment (using hand held doppler) ASSESSMENTS - Ostomy and/or Continence Assessment and Care []  - Incontinence Assessment and Management 0 []  - 0 Ostomy Care Assessment and Management (repouching, etc.) PROCESS - Coordination of Care X - Simple Patient / Family Education for ongoing care 1 15 []  - 0 Complex (extensive) Patient / Family Education for ongoing care []  - 0 Staff obtains Programmer, systems, Records, Test Results / Process Orders []  - 0 Staff telephones HHA, Nursing Homes / Clarify orders / etc []  - 0 Routine Transfer to another Facility (non-emergent condition) []  - 0 Routine Hospital Admission (non-emergent condition) []  - 0 New Admissions / Biomedical engineer / Ordering NPWT, Apligraf, etc. []  - 0 Emergency Hospital Admission (emergent condition) X- 1 10 Simple Discharge Coordination TITIANA, SEVERA. ([id number]) []  - 0 Complex (extensive) Discharge Coordination PROCESS - Special Needs []  - Pediatric / Minor Patient Management 0 []  - 0 Isolation Patient Management []  - 0 Hearing / Language / Visual special needs []  - 0 Assessment of Community assistance (transportation, D/C planning, etc.) []  - 0 Additional assistance / Altered mentation []  - 0 Support Surface(s)  Assessment (bed, cushion, seat, etc.) INTERVENTIONS - Wound Cleansing / Measurement X - Simple Wound Cleansing - one wound 1 5 []  - 0 Complex Wound Cleansing -  multiple wounds X- 1 5 Wound Imaging (photographs - any number of wounds) []  - 0 Wound Tracing (instead of photographs) X- 1 5 Simple Wound Measurement - one wound []  - 0 Complex Wound Measurement - multiple wounds INTERVENTIONS - Wound Dressings []  - Small Wound Dressing one or multiple wounds 0 []  - 0 Medium Wound Dressing one or multiple wounds []  - 0 Large Wound Dressing one or multiple wounds []  - 0 Application of Medications - topical []  - 0 Application of Medications - injection INTERVENTIONS - Miscellaneous []  - External ear exam 0 []  - 0 Specimen Collection (cultures, biopsies, blood, body fluids, etc.) []  - 0 Specimen(s) / Culture(s) sent or taken to Lab for analysis []  - 0 Patient Transfer (multiple staff / Civil Service fast streamer / Similar devices) []  - 0 Simple Staple / Suture removal (25 or less) []  - 0 Complex Staple / Suture removal (26 or more) []  - 0 Hypo / Hyperglycemic Management (close monitor of Blood Glucose) []  - 0 Ankle / Brachial Index (ABI) - do not check if billed separately X- 1 5 Vital Signs Ringold, Syenna H. ([id number]) Has the patient been seen at the hospital within the last three years: Yes Total Score: 65 Level Of Care: New/Established - Level 2 Electronic Signature(s) Signed: 08/20/2017 4:40:07 PM By: Roger Shelter Entered By: Roger Shelter on 08/20/2017 09:27:34 Maureen Morales ([id number]) -------------------------------------------------------------------------------- Encounter Discharge Information Details Patient Name: Maureen Morales. Date of Service: 08/20/2017 8:30 AM Medical Record Number: [id number] Patient Account Number: [id number] Date of Birth/Sex: October 14, 1922 (84 y.o. F) Treating RN: Roger Shelter Primary Care Menno Vanbergen: Deborra Medina Other Clinician: Referring  Katie Moch: Deborra Medina Treating Justyne Roell/Extender: Melburn Hake, HOYT Weeks in Treatment: 2 Encounter Discharge Information Items Discharge Pain Level: 0 Discharge Condition: Stable Ambulatory Status: Ambulatory Discharge Destination: Home Transportation: Other Accompanied By: daughter Schedule Follow-up Appointment: Yes Medication Reconciliation completed and No provided to Patient/Care Marlis Oldaker: Provided on Clinical Summary of Care: 08/20/2017 Form Type Recipient Paper Patient [facility] Electronic Signature(s) Signed: 08/22/2017 3:43:47 PM By: Ruthine Dose Entered By: Ruthine Dose on 08/20/2017 09:33:11 Maureen Morales ([id number]) -------------------------------------------------------------------------------- Lower Extremity Assessment Details Patient Name: Maureen Morales. Date of Service: 08/20/2017 8:30 AM Medical Record Number: [id number] Patient Account Number: [id number] Date of Birth/Sex: 22-Jan-1923 (84 y.o. F) Treating RN: Montey Hora Primary Care Vlada Uriostegui: Deborra Medina Other Clinician: Referring Hally Colella: Deborra Medina Treating Katora Fini/Extender: Melburn Hake, HOYT Weeks in Treatment: 2 Vascular Assessment Pulses: Dorsalis Pedis Palpable: [Right:Yes] Posterior Tibial Extremity colors, hair growth, and conditions: Extremity Color: [Right:Hyperpigmented] Hair Growth on Extremity: [Right:No] Temperature of Extremity: [Right:Warm] Capillary Refill: [Right:< 3 seconds] Toe Nail Assessment Left: Right: Thick: Yes Discolored: No Deformed: No Improper Length and Hygiene: No Electronic Signature(s) Signed: 08/21/2017 4:18:46 PM By: Montey Hora Entered By: Montey Hora on 08/20/2017 08:52:15 Maureen Morales ([id number]) -------------------------------------------------------------------------------- Multi Wound Chart Details Patient Name: Maureen Morales. Date of Service: 08/20/2017 8:30 AM Medical Record Number: [id number] Patient Account Number: [id number] Date of  Birth/Sex: February 04, 1923 (84 y.o. F) Treating RN: Roger Shelter Primary Care Jeffry Vogelsang: Deborra Medina Other Clinician: Referring Amadu Schlageter: Deborra Medina Treating Teeghan Hammer/Extender: Melburn Hake, HOYT Weeks in Treatment: 2 Vital Signs Height(in): 61 Pulse(bpm): 72 Weight(lbs): 124 Blood Pressure(mmHg): 155/53 Body Mass Index(BMI): 23 Temperature(F): 97.7 Respiratory Rate 18 (breaths/min): Photos: [1:No Photos] [N/A:N/A] Wound Location: [1:Right Calcaneus] [N/A:N/A] Wounding Event: [1:Pressure Injury] [N/A:N/A] Primary Etiology: [1:Pressure Ulcer] [N/A:N/A] Date Acquired: [1:06/04/2017] [N/A:N/A] Weeks of Treatment: [1:2] [N/A:N/A] Wound Status: [1:Open] [N/A:N/A] Measurements L x W x D [1:0x0x0] [  N/A:N/A] (cm) Area (cm) : [1:0] [N/A:N/A] Volume (cm) : [1:0] [N/A:N/A] % Reduction in Area: [1:100.00%] [N/A:N/A] % Reduction in Volume: [1:100.00%] [N/A:N/A] Classification: [1:Category/Stage III] [N/A:N/A] Periwound Skin Texture: [1:No Abnormalities Noted] [N/A:N/A] Periwound Skin Moisture: [1:No Abnormalities Noted] [N/A:N/A] Periwound Skin Color: [1:No Abnormalities Noted No] [N/A:N/A N/A] Treatment Notes Electronic Signature(s) Signed: 08/20/2017 4:40:07 PM By: Roger Shelter Entered By: Roger Shelter on 08/20/2017 09:24:54 Maureen Morales ([id number]) -------------------------------------------------------------------------------- [place] Details Patient Name: Maureen Morales. Date of Service: 08/20/2017 8:30 AM Medical Record Number: [id number] Patient Account Number: [id number] Date of Birth/Sex: 02-01-1923 (84 y.o. F) Treating RN: Roger Shelter Primary Care Emilija Bohman: Deborra Medina Other Clinician: Referring Tajon Moring: Deborra Medina Treating Chevette Fee/Extender: Melburn Hake, HOYT Weeks in Treatment: 2 Active Inactive Electronic Signature(s) Signed: 08/23/2017 3:28:21 PM By: Roger Shelter Previous Signature: 08/20/2017 4:40:07 PM Version By:  Roger Shelter Entered By: Roger Shelter on 08/23/2017 08:41:39 Maureen Morales ([id number]) -------------------------------------------------------------------------------- Pain Assessment Details Patient Name: Maureen Morales. Date of Service: 08/20/2017 8:30 AM Medical Record Number: [id number] Patient Account Number: [id number] Date of Birth/Sex: 09/15/1922 (84 y.o. F) Treating RN: Montey Hora Primary Care Dietra Stokely: Deborra Medina Other Clinician: Referring Elliet Goodnow: Deborra Medina Treating Layali Freund/Extender: Melburn Hake, HOYT Weeks in Treatment: 2 Active Problems Location of Pain Severity and Description of Pain Patient Has Paino No Site Locations Pain Management and Medication Current Pain Management: Electronic Signature(s) Signed: 08/21/2017 4:18:46 PM By: Montey Hora Entered By: Montey Hora on 08/20/2017 08:46:32 Maureen Morales ([id number]) -------------------------------------------------------------------------------- Patient/Caregiver Education Details Patient Name: Maureen Morales Date of Service: 08/20/2017 8:30 AM Medical Record Number: [id number] Patient Account Number: [id number] Date of Birth/Gender: [birth year] (84 y.o. F) Treating RN: Roger Shelter Primary Care Physician: Deborra Medina Other Clinician: Referring Physician: Deborra Medina Treating Physician/Extender: Sharalyn Ink in Treatment: 2 Education Assessment Education Provided To: Patient Education Topics Provided Wound/Skin Impairment: Handouts: Skin Care Do's and Dont's Methods: Explain/Verbal Responses: State content correctly Electronic Signature(s) Signed: 08/20/2017 4:40:07 PM By: Roger Shelter Entered By: Roger Shelter on 08/20/2017 09:28:30 Maureen Morales ([id number]) -------------------------------------------------------------------------------- Wound Assessment Details Patient Name: Maureen Morales. Date of Service: 08/20/2017 8:30 AM Medical Record Number:  [id number] Patient Account Number: [id number] Date of Birth/Sex: 1922/08/08 (84 y.o. F) Treating RN: Montey Hora Primary Care Davaris Youtsey: Deborra Medina Other Clinician: Referring Gianne Shugars: Deborra Medina Treating Shaquisha Wynn/Extender: Melburn Hake, HOYT Weeks in Treatment: 2 Wound Status Wound Number: 1 Primary Etiology: Pressure Ulcer Wound Location: Right Calcaneus Wound Status: Open Wounding Event: Pressure Injury Date Acquired: 06/04/2017 Weeks Of Treatment: 2 Clustered Wound: No Photos Photo Uploaded By: Montey Hora on 08/20/2017 12:40:08 Wound Measurements Length: (cm) 0 % Width: (cm) 0 % Depth: (cm) 0 Area: (cm) 0 Volume: (cm) 0 Reduction in Area: 100% Reduction in Volume: 100% Wound Description Classification: Category/Stage III Periwound Skin Texture Texture Color No Abnormalities Noted: No No Abnormalities Noted: No Moisture No Abnormalities Noted: No Electronic Signature(s) Signed: 08/21/2017 4:18:46 PM By: Montey Hora Entered By: Montey Hora on 08/20/2017 08:51:57 Maureen Morales ([id number]) -------------------------------------------------------------------------------- [place] Details Patient Name: Maureen Morales. Date of Service: 08/20/2017 8:30 AM Medical Record Number: [id number] Patient Account Number: [id number] Date of Birth/Sex: [birth year] (84 y.o. F) Treating RN: Montey Hora Primary Care Bennetta Rudden: Deborra Medina Other Clinician: Referring Madox Corkins: Deborra Medina Treating Yifan Auker/Extender: Melburn Hake, HOYT Weeks in Treatment: 2 Vital Signs Time Taken: 08:46 Temperature (F): 97.7 Height (in): 61 Pulse (bpm): 72 Weight (lbs): 124 Respiratory Rate (breaths/min): 18 Body Mass Index (BMI): 23.4 Blood Pressure (mmHg):  155/53 Reference Range: 80 - 120 mg / dl Electronic Signature(s) Signed: 08/21/2017 4:18:46 PM By: Montey Hora Entered By: Montey Hora on 08/20/2017 08:48:20

## 2017-08-28 DIAGNOSIS — Z96641 Presence of right artificial hip joint: Secondary | ICD-10-CM | POA: Diagnosis not present

## 2017-08-28 DIAGNOSIS — L89612 Pressure ulcer of right heel, stage 2: Secondary | ICD-10-CM | POA: Diagnosis not present

## 2017-08-28 DIAGNOSIS — E1151 Type 2 diabetes mellitus with diabetic peripheral angiopathy without gangrene: Secondary | ICD-10-CM | POA: Diagnosis not present

## 2017-08-28 DIAGNOSIS — M9701XD Periprosthetic fracture around internal prosthetic right hip joint, subsequent encounter: Secondary | ICD-10-CM | POA: Diagnosis not present

## 2017-08-28 DIAGNOSIS — I252 Old myocardial infarction: Secondary | ICD-10-CM | POA: Diagnosis not present

## 2017-08-28 DIAGNOSIS — I2510 Atherosclerotic heart disease of native coronary artery without angina pectoris: Secondary | ICD-10-CM | POA: Diagnosis not present

## 2017-08-30 DIAGNOSIS — N183 Chronic kidney disease, stage 3 (moderate): Secondary | ICD-10-CM | POA: Diagnosis not present

## 2017-08-30 DIAGNOSIS — Z9181 History of falling: Secondary | ICD-10-CM

## 2017-08-30 DIAGNOSIS — I130 Hypertensive heart and chronic kidney disease with heart failure and stage 1 through stage 4 chronic kidney disease, or unspecified chronic kidney disease: Secondary | ICD-10-CM | POA: Diagnosis not present

## 2017-08-30 DIAGNOSIS — L89612 Pressure ulcer of right heel, stage 2: Secondary | ICD-10-CM | POA: Diagnosis not present

## 2017-08-30 DIAGNOSIS — E8809 Other disorders of plasma-protein metabolism, not elsewhere classified: Secondary | ICD-10-CM | POA: Diagnosis not present

## 2017-08-30 DIAGNOSIS — I252 Old myocardial infarction: Secondary | ICD-10-CM | POA: Diagnosis not present

## 2017-08-30 DIAGNOSIS — Z7984 Long term (current) use of oral hypoglycemic drugs: Secondary | ICD-10-CM

## 2017-08-30 DIAGNOSIS — E1151 Type 2 diabetes mellitus with diabetic peripheral angiopathy without gangrene: Secondary | ICD-10-CM | POA: Diagnosis not present

## 2017-08-30 DIAGNOSIS — H9042 Sensorineural hearing loss, unilateral, left ear, with unrestricted hearing on the contralateral side: Secondary | ICD-10-CM

## 2017-08-30 DIAGNOSIS — E1122 Type 2 diabetes mellitus with diabetic chronic kidney disease: Secondary | ICD-10-CM | POA: Diagnosis not present

## 2017-08-30 DIAGNOSIS — I2510 Atherosclerotic heart disease of native coronary artery without angina pectoris: Secondary | ICD-10-CM | POA: Diagnosis not present

## 2017-08-30 DIAGNOSIS — W19XXXD Unspecified fall, subsequent encounter: Secondary | ICD-10-CM

## 2017-08-30 DIAGNOSIS — L89610 Pressure ulcer of right heel, unstageable: Secondary | ICD-10-CM | POA: Diagnosis not present

## 2017-08-30 DIAGNOSIS — M9701XD Periprosthetic fracture around internal prosthetic right hip joint, subsequent encounter: Secondary | ICD-10-CM | POA: Diagnosis not present

## 2017-08-30 DIAGNOSIS — J4520 Mild intermittent asthma, uncomplicated: Secondary | ICD-10-CM | POA: Diagnosis not present

## 2017-08-30 DIAGNOSIS — F329 Major depressive disorder, single episode, unspecified: Secondary | ICD-10-CM

## 2017-08-30 DIAGNOSIS — I5032 Chronic diastolic (congestive) heart failure: Secondary | ICD-10-CM | POA: Diagnosis not present

## 2017-08-30 DIAGNOSIS — Z96641 Presence of right artificial hip joint: Secondary | ICD-10-CM | POA: Diagnosis not present

## 2017-08-30 DIAGNOSIS — F411 Generalized anxiety disorder: Secondary | ICD-10-CM

## 2017-08-30 DIAGNOSIS — E785 Hyperlipidemia, unspecified: Secondary | ICD-10-CM

## 2017-08-31 ENCOUNTER — Telehealth: Payer: Self-pay | Admitting: Cardiovascular Disease

## 2017-08-31 MED ORDER — HYDRALAZINE HCL 50 MG PO TABS: 50.0000 mg | ORAL_TABLET | Freq: Three times a day (TID) | ORAL | 3 refills | Status: DC

## 2017-08-31 MED ORDER — ISOSORBIDE MONONITRATE ER 30 MG PO TB24: 30.0000 mg | ORAL_TABLET | Freq: Every day | ORAL | 3 refills | Status: DC

## 2017-08-31 NOTE — Telephone Encounter (Signed)
Pt c/o of Chest Pain: STAT if CP now or developed within 24 hours  1. Are you having CP right now? no  2. Are you experiencing any other symptoms (ex. SOB, nausea, vomiting, sweating)? High BP last night 168/77, this morning before meds 166/72, after medications 134/68   3. How long have you been experiencing CP? 2 days , 2 nights in a row  4. Is your CP continuous or coming and going? Comes and goes  5. Have you taken Nitroglycerin? 2 last night ?

## 2017-08-31 NOTE — Telephone Encounter (Signed)
Hydralazine can cause tachycardia.  Probably best to decrease hydralazine back to 50 mg 3 times daily and add Imdur 30 mg once daily to help with blood pressure and chest pain.

## 2017-08-31 NOTE — Telephone Encounter (Signed)
Spoke with patient. No other caregivers home at this time. Patient wrote down the medication changes and read back. She says Dr Derrel Nip will be coming by her house later tonight. Dr Fletcher Anon also sent these instructions to Dr Derrel Nip. I asked patient to discuss with Dr Derrel Nip to make sure that patient understands the med changes and all. Patient says someone is there who can go pick up the prescriptions. Advised to call us back if she has any further questions or concerns.

## 2017-08-31 NOTE — Telephone Encounter (Signed)
S/w caregiver. Patient has had episodes of chest pain the past 2 nights. The first night patient did not tell anyone she had been having the chest pain. Last night, Dr Derrel Nip was there and had patient take 2 nitro and the patient said the pain went away. She also experiences shortness of breath when she has the chest pain and states she feels like her heart is beating fast. However, her heart rate is around 87. Denies nausea, vomiting or sweating.   They are also concerned because BP is trending upwards 168/77 last night, this morning before meds 166/72, after meds 134/68. Updated patient's med list and it is correct now.  Recently, Dr Rolland Bimler increased patient hydralazine.  They would like a sooner appointment because of their concern. Advised I could reschedule her for 09/05/17 at 2:30 with Thurmond Butts which they were agreeable to. Caregiver verbalized understanding to call 911 or go to the emergency room, if he develops any new or worsening symptoms occur prior to appointment.  Will route to Dr Fletcher Anon to review for further advice.

## 2017-09-01 NOTE — Telephone Encounter (Signed)
Thanks for letting me know.  She was chest pain free Friday night after taking first dos of Imdur and BP was excellent Satruday morning

## 2017-09-03 ENCOUNTER — Ambulatory Visit (INDEPENDENT_AMBULATORY_CARE_PROVIDER_SITE_OTHER): Payer: Medicare Other | Admitting: Orthopaedic Surgery

## 2017-09-03 ENCOUNTER — Encounter: Payer: Self-pay | Admitting: Internal Medicine

## 2017-09-03 ENCOUNTER — Telehealth: Payer: Self-pay | Admitting: Internal Medicine

## 2017-09-03 DIAGNOSIS — I080 Rheumatic disorders of both mitral and aortic valves: Secondary | ICD-10-CM | POA: Insufficient documentation

## 2017-09-03 DIAGNOSIS — I071 Rheumatic tricuspid insufficiency: Secondary | ICD-10-CM | POA: Insufficient documentation

## 2017-09-03 DIAGNOSIS — I2510 Atherosclerotic heart disease of native coronary artery without angina pectoris: Secondary | ICD-10-CM | POA: Diagnosis not present

## 2017-09-03 DIAGNOSIS — I5189 Other ill-defined heart diseases: Secondary | ICD-10-CM | POA: Insufficient documentation

## 2017-09-03 DIAGNOSIS — E1151 Type 2 diabetes mellitus with diabetic peripheral angiopathy without gangrene: Secondary | ICD-10-CM | POA: Diagnosis not present

## 2017-09-03 DIAGNOSIS — M9701XD Periprosthetic fracture around internal prosthetic right hip joint, subsequent encounter: Secondary | ICD-10-CM | POA: Diagnosis not present

## 2017-09-03 DIAGNOSIS — Z96641 Presence of right artificial hip joint: Secondary | ICD-10-CM | POA: Diagnosis not present

## 2017-09-03 DIAGNOSIS — L89612 Pressure ulcer of right heel, stage 2: Secondary | ICD-10-CM | POA: Diagnosis not present

## 2017-09-03 DIAGNOSIS — I252 Old myocardial infarction: Secondary | ICD-10-CM | POA: Diagnosis not present

## 2017-09-03 NOTE — Telephone Encounter (Signed)
Family  Called Dr Derrel Nip about patient feeling very dizzy when she woke up this morning .  Diastolic blood pressure was 45.  Repeat reading was "better " when measured by PT .  Advised to reduce dose to 25 mg tid and continue Imdur 30 mg daily . Will consider adding long acting calcium channel blocker if isolated systolic hypertension remains problematic , pending your visit with her on Wednesday

## 2017-09-04 ENCOUNTER — Ambulatory Visit (INDEPENDENT_AMBULATORY_CARE_PROVIDER_SITE_OTHER): Payer: Medicare Other

## 2017-09-04 ENCOUNTER — Ambulatory Visit (INDEPENDENT_AMBULATORY_CARE_PROVIDER_SITE_OTHER): Payer: Medicare Other | Admitting: Orthopaedic Surgery

## 2017-09-04 ENCOUNTER — Encounter (INDEPENDENT_AMBULATORY_CARE_PROVIDER_SITE_OTHER): Payer: Self-pay | Admitting: Orthopaedic Surgery

## 2017-09-04 DIAGNOSIS — M9701XD Periprosthetic fracture around internal prosthetic right hip joint, subsequent encounter: Secondary | ICD-10-CM | POA: Diagnosis not present

## 2017-09-04 NOTE — Progress Notes (Signed)
The patient is a very pleasant 84 year old female who is now 100 days status post open reduction and fixation of a right periprosthetic femur fracture.  This was around a hip prosthesis.  She ambulance with a walker.  She is having home therapy work on her balance coordination with letter weight-bear as tolerated.  She only has pain in the back of her knee at night.  She says Tylenol helps her at night as well as put a pillow behind her knee which she just started last night.  On exam I can easily put her hip the range of motion with no difficulty at all and there is no significant pain in her leg today.  X-rays also confirm that the fracture is healed and there is no evidence of hardware failure or other issues.  This point we will have home therapy continue for at least another month to work on her balance coordination.  All questions were answered and addressed.  Follow-up can be as needed at this standpoint.

## 2017-09-05 ENCOUNTER — Ambulatory Visit (INDEPENDENT_AMBULATORY_CARE_PROVIDER_SITE_OTHER): Payer: Medicare Other | Admitting: Orthopaedic Surgery

## 2017-09-05 ENCOUNTER — Ambulatory Visit: Payer: Medicare Other | Admitting: Physician Assistant

## 2017-09-05 DIAGNOSIS — E1151 Type 2 diabetes mellitus with diabetic peripheral angiopathy without gangrene: Secondary | ICD-10-CM | POA: Diagnosis not present

## 2017-09-05 DIAGNOSIS — Z96641 Presence of right artificial hip joint: Secondary | ICD-10-CM | POA: Diagnosis not present

## 2017-09-05 DIAGNOSIS — I252 Old myocardial infarction: Secondary | ICD-10-CM | POA: Diagnosis not present

## 2017-09-05 DIAGNOSIS — L89612 Pressure ulcer of right heel, stage 2: Secondary | ICD-10-CM | POA: Diagnosis not present

## 2017-09-05 DIAGNOSIS — I2510 Atherosclerotic heart disease of native coronary artery without angina pectoris: Secondary | ICD-10-CM | POA: Diagnosis not present

## 2017-09-05 DIAGNOSIS — M9701XD Periprosthetic fracture around internal prosthetic right hip joint, subsequent encounter: Secondary | ICD-10-CM | POA: Diagnosis not present

## 2017-09-06 ENCOUNTER — Ambulatory Visit: Payer: Medicare Other | Admitting: Physical Medicine & Rehabilitation

## 2017-09-07 ENCOUNTER — Ambulatory Visit (INDEPENDENT_AMBULATORY_CARE_PROVIDER_SITE_OTHER): Payer: Medicare Other | Admitting: Cardiovascular Disease

## 2017-09-07 ENCOUNTER — Encounter: Payer: Self-pay | Admitting: Cardiovascular Disease

## 2017-09-07 VITALS — BP 160/60 | HR 67 | Ht 61.0 in | Wt 130.0 lb

## 2017-09-07 DIAGNOSIS — I10 Essential (primary) hypertension: Secondary | ICD-10-CM | POA: Diagnosis not present

## 2017-09-07 DIAGNOSIS — I25118 Atherosclerotic heart disease of native coronary artery with other forms of angina pectoris: Secondary | ICD-10-CM

## 2017-09-07 DIAGNOSIS — I350 Nonrheumatic aortic (valve) stenosis: Secondary | ICD-10-CM | POA: Diagnosis not present

## 2017-09-07 DIAGNOSIS — E7849 Other hyperlipidemia: Secondary | ICD-10-CM | POA: Diagnosis not present

## 2017-09-07 MED ORDER — ISOSORBIDE MONONITRATE ER 60 MG PO TB24: 60.0000 mg | ORAL_TABLET | Freq: Every day | ORAL | 1 refills | Status: DC

## 2017-09-07 MED ORDER — METOPROLOL SUCCINATE ER 100 MG PO TB24: 100.0000 mg | ORAL_TABLET | Freq: Every day | ORAL | 1 refills | Status: DC

## 2017-09-07 NOTE — Patient Instructions (Signed)
Medication Instructions:  Your physician has recommended you make the following change in your medication:  1) INCREASE Metoprolol to 100mg  daily. An Rx has been sent to your pharmacy 2) INCREASE Imdur to 60mg  daily. An Rx has been sent to your pharmacy   Labwork: None ordered  Testing/Procedures: None ordered  Follow-Up: Your physician recommends that you schedule a follow-up appointment in: 4 weeks with Dr.Arida   Any Other Special Instructions Will Be Listed Below (If Applicable).     If you need a refill on your cardiac medications before your next appointment, please call your pharmacy.

## 2017-09-07 NOTE — Progress Notes (Signed)
Cardiology Office Note   Date:  09/07/2017   ID:  Maureen Morales, DOB February 08, 1923, MRN [id number]  PCP:  [name], MD  Cardiologist:   [name], MD   Chief Complaint  Patient presents with  . other    Pt. c/o chest pain with having to take NTG tablets & increased blood pressure. Meds reviewed by the pt. verbally.       History of Present Illness: Maureen Morales is a 84 y.o. female who presents for a follow up regarding aortic stenosis coronary artery disease. She has chronic medical conditions that include hypertension, hyperlipidemia and type 2 diabetes.  She was hospitalized in May of 2018 with non-ST elevation myocardial infarction in the setting of uncontrolled hypertension. Troponin peaked at 5.79. Echocardiogram showed hyperdynamic LV systolic function, mild aortic stenosis with mean gradient of 14 mmHg, mild mitral regurgitation, mild pulmonary hypertension and trivial posterior pericardial effusion. Cardiac catheterization showed mild to moderate nonobstructive coronary artery disease. Worst stenosis was 60% in the mid right coronary artery. Outpatient renal artery duplex showed no evidence of renal artery stenosis. She fell in October, 2018 and fractured her right hip.  She underwent surgical repair.  She fell again in January and fractured the same.  Another surgery was performed.  Most recent echocardiogram in January 2019 showed hyperdynamic LV systolic function, moderate aortic stenosis with a mean gradient of 29 mmHg and a valve area of 1.84.  Systolic pulmonary pressure was severely increased at 100 mmHg.  She has been having high blood pressure readings above 762 systolic with intermittent episodes of chest pain.  This is similar to her previous presentation with myocardial infarction.  She used to be on amlodipine and hydrochlorothiazide but these we are discontinued after her hip surgery due to low blood pressure.  Amlodipine did cause leg edema.  She was  started on hydralazine recently but that caused dizziness.  I added Imdur and decrease the dose of hydralazine but blood pressure is still not controlled.  She reports palpitations as well.  Past Medical History:  Diagnosis Date  . Asthma without status asthmaticus    unspecified  . CAD (coronary artery disease)   . Colitis    unspecified  . Complication of anesthesia    sensitive to anesthesia  . Depression   . Diabetes mellitus   . Diabetes mellitus type 2, uncomplicated ([place])   . Essential hypertension   . GERD (gastroesophageal reflux disease)   . 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 HLD (hyperlipidemia)   . Hyperlipidemia    unspecified  . Hypertension   . Myocardial infarction ([place]) 2018  . Peripheral vascular disease ([place])   . Positive H. pylori test   . Viral gastroenteritis due to [place]walk-like agent Nov 2012    Past Surgical History:  Procedure Laterality Date  . ABDOMINAL HYSTERECTOMY    . BLADDER SURGERY    . CARDIAC CATHETERIZATION    . CHOLECYSTECTOMY    . COLONOSCOPY     06/16/1987, 02/20/1995, 05/04/1999, 02/14/2005, 05/17/2007  . ESOPHAGOGASTRODUODENOSCOPY     05/11/1987, 06/13/1995, 10/09/1995, 05/04/1999, 01/16/2002  . FLEXIBLE SIGMOIDOSCOPY N/A 05/02/2017   Procedure: FLEXIBLE SIGMOIDOSCOPY;  Surgeon: Manya Silvas, MD;  Location: [facility] ENDOSCOPY;  Service: Endoscopy;  Laterality: N/A;  . HEMORRHOID SURGERY N/A 05/14/2017   Procedure: EXTERNAL THROMBOSED HEMORRHOIDECTOMY;  Surgeon: Robert Bellow, MD;  Location: [facility] ORS;  Service: General;  Laterality: N/A;  . HIP ARTHROPLASTY Right  02/17/2017   Procedure: ARTHROPLASTY BIPOLAR HIP (HEMIARTHROPLASTY);  Surgeon: Claud Kelp, MD;  Location: [facility] ORS;  Service: Orthopedics;  Laterality: Right;  . LEFT HEART CATH AND CORONARY ANGIOGRAPHY N/A 08/31/2016   Procedure: Left Heart Cath and Coronary Angiography;  Surgeon: Wellington Hampshire, MD;  Location: [place] CV LAB;  Service:  Cardiovascular;  Laterality: N/A;  . ORIF PERIPROSTHETIC FRACTURE Right 05/27/2017  . ORIF PERIPROSTHETIC FRACTURE Right 05/27/2017   Procedure: OPEN REDUCTION INTERNAL FIXATION (ORIF) PERIPROSTHETIC FRACTURE;  Surgeon: Mcarthur Rossetti, MD;  Location: [place];  Service: Orthopedics;  Laterality: Right;  . TOTAL HIP ARTHROPLASTY Right   . uterian prolapse       Current Outpatient Medications  Medication Sig Dispense Refill  . atorvastatin (LIPITOR) 40 MG tablet Take 40 mg by mouth daily.    . clindamycin (CLEOCIN) 300 MG capsule 2 capsules one hour before dental procedure 2 capsule 2  . FLORA-Q (FLORA-Q) CAPS capsule Take 1 capsule by mouth daily.    [name] glipiZIDE (GLUCOTROL) 5 MG tablet Take 0.5 tablets (2.5 mg total) by mouth daily before breakfast. (Patient taking differently: Take 2.5 mg by mouth every evening. ) 90 tablet 1  . glucose blood (BAYER CONTOUR TEST) test strip Use as instructed 100 each 5  . hydrALAZINE (APRESOLINE) 50 MG tablet Take 1 tablet (50 mg total) by mouth 3 (three) times daily. (Patient taking differently: Take 25 mg by mouth 3 (three) times daily. ) 270 tablet 3  . isosorbide mononitrate (IMDUR) 60 MG 24 hr tablet Take 1 tablet (60 mg total) by mouth daily. 90 tablet 1  . loratadine (CLARITIN) 10 MG tablet Take 10 mg daily by mouth.     . losartan (COZAAR) 100 MG tablet Take 100 mg by mouth daily.    . metoprolol succinate (TOPROL-XL) 100 MG 24 hr tablet Take 1 tablet (100 mg total) by mouth daily. With or following a meal 90 tablet 1  . nitroGLYCERIN (NITROSTAT) 0.4 MG SL tablet Place 1 tablet (0.4 mg total) under the tongue every 5 (five) minutes as needed for chest pain. 10 tablet 0  . sertraline (ZOLOFT) 50 MG tablet Take 1 tablet (50 mg total) by mouth daily. 30 tablet 0  . vitamin C (ASCORBIC ACID) 500 MG tablet Take 1 tablet (500 mg total) by mouth 2 (two) times daily. 60 tablet 2   No current facility-administered medications for this visit.      Allergies:   Asa [aspirin]; Aspirin; Atropine; Belladonna alkaloids; Codeine; Darvon; Darvon [propoxyphene]; Demerol [meperidine]; Ferrous sulfate; Meperidine and related; Methocarbamol; Penicillins; Penicillins; Sulfa antibiotics; and Iron    Social History:  The patient  reports that she has never smoked. She has never used smokeless tobacco. She reports that she does not drink alcohol or use drugs.   Family History:  The patient's family history includes Breast cancer (age of onset: 1) in her sister; Breast cancer (age of onset: 62) in her mother; Cancer in her mother; Colon cancer in her brother, brother, and father; Stroke in her sister.    ROS:  Please see the history of present illness.   Otherwise, review of systems are positive for none.   All other systems are reviewed and negative.    PHYSICAL EXAM: VS:  BP (!) 160/60 (BP Location: Right Arm, Patient Position: Sitting, Cuff Size: Normal)   Pulse 67   Ht 5\' 1"  (1.549 m)   Wt 130 lb (59 kg)   BMI 24.56 kg/m  ,  BMI Body mass index is 24.56 kg/m. GEN: Well nourished, well developed, in no acute distress  HEENT: normal  Neck: no JVD, carotid bruits, or masses Cardiac: RRR; no rubs, or gallops, mild bilateral leg edema . 3/6 crescendo decrescendo systolic murmur in the aortic area which is mid peaking Respiratory:  clear to auscultation bilaterally, normal work of breathing GI: soft, nontender, nondistended, + BS MS: no deformity or atrophy  Skin: warm and dry, no rash Neuro:  Strength and sensation are intact Psych: euthymic mood, full affect    EKG:  EKG is ordered today. The ekg ordered today demonstrates normal sinus rhythm with sinus arrhythmia.   Recent Labs: 11/03/2016: Magnesium 1.7 05/27/2017: B Natriuretic Peptide 221.6 05/31/2017: ALT 21 06/08/2017: BUN 31; Creatinine, Ser 1.18; Hemoglobin 9.7; Platelets 468; Potassium 4.0; Sodium 139    Lipid Panel    Component Value Date/Time   CHOL 132 12/04/2016 0808    TRIG 128.0 12/04/2016 0808   HDL 38.30 (L) 12/04/2016 0808   CHOLHDL 3 12/04/2016 0808   VLDL 25.6 12/04/2016 0808   LDLCALC 68 12/04/2016 0808   LDLDIRECT 92.0 07/06/2016 1059      Wt Readings from Last 3 Encounters:  09/07/17 130 lb (59 kg)  08/23/17 132 lb (59.9 kg)  06/28/17 125 lb (56.7 kg)       No flowsheet data found.    ASSESSMENT AND PLAN:  1. Coronary artery disease involving native coronary arteries with angina: Recent worsening in chest pain is likely due to uncontrolled hypertension.  I increased Imdur to 60 mg once daily and Toprol to 100 mg once daily.  2. Mild to moderate aortic valve stenosis: Repeat echocardiogram in 6 months from now.  3. Essential hypertension: Blood pressure is not controlled.  I increased Toprol and Imdur as outlined above.  If blood pressure remains uncontrolled, I might consider switching Toprol to carvedilol or adding small dose hydrochlorothiazide.  I would like to add avoid amlodipine for now given leg edema.  4. Hyperlipidemia: Continue treatment with atorvastatin. Most recent lipid profile showed an LDL of 68.  5.  Pulmonary hypertension: Was noted on most recent echocardiogram but I suspect that her blood pressure was significantly elevated at that time.  Will reevaluate with echocardiogram in 6 months.    Disposition:   FU with me in 1 month.  Signed,  [name], MD  09/07/2017 5:45 PM    [place]

## 2017-09-10 ENCOUNTER — Ambulatory Visit: Payer: Medicare Other | Admitting: Physician Assistant

## 2017-09-10 DIAGNOSIS — I252 Old myocardial infarction: Secondary | ICD-10-CM | POA: Diagnosis not present

## 2017-09-10 DIAGNOSIS — E1151 Type 2 diabetes mellitus with diabetic peripheral angiopathy without gangrene: Secondary | ICD-10-CM | POA: Diagnosis not present

## 2017-09-10 DIAGNOSIS — I2510 Atherosclerotic heart disease of native coronary artery without angina pectoris: Secondary | ICD-10-CM | POA: Diagnosis not present

## 2017-09-10 DIAGNOSIS — M9701XD Periprosthetic fracture around internal prosthetic right hip joint, subsequent encounter: Secondary | ICD-10-CM | POA: Diagnosis not present

## 2017-09-10 DIAGNOSIS — L89612 Pressure ulcer of right heel, stage 2: Secondary | ICD-10-CM | POA: Diagnosis not present

## 2017-09-10 DIAGNOSIS — Z96641 Presence of right artificial hip joint: Secondary | ICD-10-CM | POA: Diagnosis not present

## 2017-09-14 DIAGNOSIS — M9701XD Periprosthetic fracture around internal prosthetic right hip joint, subsequent encounter: Secondary | ICD-10-CM | POA: Diagnosis not present

## 2017-09-14 DIAGNOSIS — E1151 Type 2 diabetes mellitus with diabetic peripheral angiopathy without gangrene: Secondary | ICD-10-CM | POA: Diagnosis not present

## 2017-09-14 DIAGNOSIS — I252 Old myocardial infarction: Secondary | ICD-10-CM | POA: Diagnosis not present

## 2017-09-14 DIAGNOSIS — L89612 Pressure ulcer of right heel, stage 2: Secondary | ICD-10-CM | POA: Diagnosis not present

## 2017-09-14 DIAGNOSIS — I2510 Atherosclerotic heart disease of native coronary artery without angina pectoris: Secondary | ICD-10-CM | POA: Diagnosis not present

## 2017-09-14 DIAGNOSIS — Z96641 Presence of right artificial hip joint: Secondary | ICD-10-CM | POA: Diagnosis not present

## 2017-09-17 DIAGNOSIS — I252 Old myocardial infarction: Secondary | ICD-10-CM | POA: Diagnosis not present

## 2017-09-17 DIAGNOSIS — L89612 Pressure ulcer of right heel, stage 2: Secondary | ICD-10-CM | POA: Diagnosis not present

## 2017-09-17 DIAGNOSIS — E1151 Type 2 diabetes mellitus with diabetic peripheral angiopathy without gangrene: Secondary | ICD-10-CM | POA: Diagnosis not present

## 2017-09-17 DIAGNOSIS — I2510 Atherosclerotic heart disease of native coronary artery without angina pectoris: Secondary | ICD-10-CM | POA: Diagnosis not present

## 2017-09-17 DIAGNOSIS — Z96641 Presence of right artificial hip joint: Secondary | ICD-10-CM | POA: Diagnosis not present

## 2017-09-17 DIAGNOSIS — M9701XD Periprosthetic fracture around internal prosthetic right hip joint, subsequent encounter: Secondary | ICD-10-CM | POA: Diagnosis not present

## 2017-09-20 DIAGNOSIS — Z96641 Presence of right artificial hip joint: Secondary | ICD-10-CM | POA: Diagnosis not present

## 2017-09-20 DIAGNOSIS — I2510 Atherosclerotic heart disease of native coronary artery without angina pectoris: Secondary | ICD-10-CM | POA: Diagnosis not present

## 2017-09-20 DIAGNOSIS — M9701XD Periprosthetic fracture around internal prosthetic right hip joint, subsequent encounter: Secondary | ICD-10-CM | POA: Diagnosis not present

## 2017-09-20 DIAGNOSIS — E1151 Type 2 diabetes mellitus with diabetic peripheral angiopathy without gangrene: Secondary | ICD-10-CM | POA: Diagnosis not present

## 2017-09-20 DIAGNOSIS — I252 Old myocardial infarction: Secondary | ICD-10-CM | POA: Diagnosis not present

## 2017-09-20 DIAGNOSIS — L89612 Pressure ulcer of right heel, stage 2: Secondary | ICD-10-CM | POA: Diagnosis not present

## 2017-09-25 DIAGNOSIS — I252 Old myocardial infarction: Secondary | ICD-10-CM | POA: Diagnosis not present

## 2017-09-25 DIAGNOSIS — L89612 Pressure ulcer of right heel, stage 2: Secondary | ICD-10-CM | POA: Diagnosis not present

## 2017-09-25 DIAGNOSIS — Z96641 Presence of right artificial hip joint: Secondary | ICD-10-CM | POA: Diagnosis not present

## 2017-09-25 DIAGNOSIS — I2510 Atherosclerotic heart disease of native coronary artery without angina pectoris: Secondary | ICD-10-CM | POA: Diagnosis not present

## 2017-09-25 DIAGNOSIS — E1151 Type 2 diabetes mellitus with diabetic peripheral angiopathy without gangrene: Secondary | ICD-10-CM | POA: Diagnosis not present

## 2017-09-25 DIAGNOSIS — M9701XD Periprosthetic fracture around internal prosthetic right hip joint, subsequent encounter: Secondary | ICD-10-CM | POA: Diagnosis not present

## 2017-09-26 ENCOUNTER — Other Ambulatory Visit: Payer: Self-pay | Admitting: Internal Medicine

## 2017-09-27 NOTE — Telephone Encounter (Signed)
According to the refill request it stated that on 05/30/2017 pt was to stop taking medication at discharge.

## 2017-10-02 ENCOUNTER — Telehealth: Payer: Self-pay | Admitting: Internal Medicine

## 2017-10-02 DIAGNOSIS — Z96641 Presence of right artificial hip joint: Secondary | ICD-10-CM | POA: Diagnosis not present

## 2017-10-02 DIAGNOSIS — L89612 Pressure ulcer of right heel, stage 2: Secondary | ICD-10-CM | POA: Diagnosis not present

## 2017-10-02 DIAGNOSIS — I2510 Atherosclerotic heart disease of native coronary artery without angina pectoris: Secondary | ICD-10-CM | POA: Diagnosis not present

## 2017-10-02 DIAGNOSIS — M9701XD Periprosthetic fracture around internal prosthetic right hip joint, subsequent encounter: Secondary | ICD-10-CM | POA: Diagnosis not present

## 2017-10-02 DIAGNOSIS — E1151 Type 2 diabetes mellitus with diabetic peripheral angiopathy without gangrene: Secondary | ICD-10-CM | POA: Diagnosis not present

## 2017-10-02 DIAGNOSIS — I252 Old myocardial infarction: Secondary | ICD-10-CM | POA: Diagnosis not present

## 2017-10-02 NOTE — Telephone Encounter (Signed)
Noted. Need to see more readings at rest before I make any changes . I will talk to her daughter [name]

## 2017-10-02 NOTE — Telephone Encounter (Signed)
Copied from [place] [phone]. Topic: Quick Communication - See Telephone Encounter >> Oct 02, 2017 12:11 PM Percell Belt A wrote: CRM for notification. See Telephone encounter for: 10/02/17. Jeani Hawking with [facility] [phone] Pt bp  was elevated today 180/70 at rest and after walking 190/72  The caregiver felt like there was some anxiety that pt was dealing with

## 2017-10-03 ENCOUNTER — Other Ambulatory Visit: Payer: Self-pay | Admitting: Internal Medicine

## 2017-10-05 ENCOUNTER — Ambulatory Visit (INDEPENDENT_AMBULATORY_CARE_PROVIDER_SITE_OTHER): Payer: Medicare Other | Admitting: Cardiovascular Disease

## 2017-10-05 ENCOUNTER — Encounter: Payer: Self-pay | Admitting: Cardiovascular Disease

## 2017-10-05 VITALS — BP 190/78 | HR 63 | Ht 61.0 in | Wt 132.5 lb

## 2017-10-05 DIAGNOSIS — E7849 Other hyperlipidemia: Secondary | ICD-10-CM

## 2017-10-05 DIAGNOSIS — I10 Essential (primary) hypertension: Secondary | ICD-10-CM | POA: Diagnosis not present

## 2017-10-05 DIAGNOSIS — I25118 Atherosclerotic heart disease of native coronary artery with other forms of angina pectoris: Secondary | ICD-10-CM

## 2017-10-05 DIAGNOSIS — I350 Nonrheumatic aortic (valve) stenosis: Secondary | ICD-10-CM | POA: Diagnosis not present

## 2017-10-05 MED ORDER — LOSARTAN POTASSIUM-HCTZ 100-25 MG PO TABS: 1.0000 | ORAL_TABLET | Freq: Every day | ORAL | 1 refills | Status: DC

## 2017-10-05 NOTE — Progress Notes (Signed)
Cardiology Office Note   Date:  10/05/2017   ID:  Maureen Morales, DOB 13-Feb-1923, MRN [id number]  PCP:  [name], MD  Cardiologist:   [name], MD   Chief Complaint  Patient presents with  . other    4 wk f/u discuss medications c/o flunctutating BP and diarrhea. Meds reviewed verbally with pt.      History of Present Illness: Maureen Morales is a 84 y.o. female who presents for a follow up regarding aortic stenosis coronary artery disease. She has chronic medical conditions that include hypertension, hyperlipidemia and type 2 diabetes.  She was hospitalized in May of 2018 with non-ST elevation myocardial infarction in the setting of uncontrolled hypertension. Troponin peaked at 5.79. Echocardiogram showed hyperdynamic LV systolic function, mild aortic stenosis with mean gradient of 14 mmHg, mild mitral regurgitation, mild pulmonary hypertension and trivial posterior pericardial effusion. Cardiac catheterization showed mild to moderate nonobstructive coronary artery disease. Worst stenosis was 60% in the mid right coronary artery. Outpatient renal artery duplex showed no evidence of renal artery stenosis. She fell in October, 2018 and fractured her right hip.  She underwent surgical repair.  She fell again in January and fractured the same.  Another surgery was performed.  Most recent echocardiogram in January 2019 showed hyperdynamic LV systolic function, moderate aortic stenosis with a mean gradient of 29 mmHg and a valve area of 1.84.  Systolic pulmonary pressure was severely increased at 100 mmHg.  She was seen recently for uncontrolled hypertension. She used to be on amlodipine and hydrochlorothiazide but these we are discontinued after her hip surgery due to low blood pressure.  Amlodipine did cause leg edema.  She had dizziness with hydralazine and the dose was decreased.  During last visit, I increased Toprol and Imdur blood pressure readings at home and they  continue to be elevated around 160.  I reviewed systolic.  She reports increased diarrhea over the last few months.  This could be due to hydralazine.  No recent chest pain or shortness of breath.  Past Medical History:  Diagnosis Date  . Asthma without status asthmaticus    unspecified  . CAD (coronary artery disease)   . Colitis    unspecified  . Complication of anesthesia    sensitive to anesthesia  . Depression   . Diabetes mellitus   . Diabetes mellitus type 2, uncomplicated ([place])   . Essential hypertension   . GERD (gastroesophageal reflux disease)   . 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 HLD (hyperlipidemia)   . Hyperlipidemia    unspecified  . Hypertension   . Myocardial infarction ([place]) 2018  . Peripheral vascular disease ([place])   . Positive H. pylori test   . Viral gastroenteritis due to [place]walk-like agent Nov 2012    Past Surgical History:  Procedure Laterality Date  . ABDOMINAL HYSTERECTOMY    . BLADDER SURGERY    . CARDIAC CATHETERIZATION    . CHOLECYSTECTOMY    . COLONOSCOPY     06/16/1987, 02/20/1995, 05/04/1999, 02/14/2005, 05/17/2007  . ESOPHAGOGASTRODUODENOSCOPY     05/11/1987, 06/13/1995, 10/09/1995, 05/04/1999, 01/16/2002  . FLEXIBLE SIGMOIDOSCOPY N/A 05/02/2017   Procedure: FLEXIBLE SIGMOIDOSCOPY;  Surgeon: Manya Silvas, MD;  Location: [facility] ENDOSCOPY;  Service: Endoscopy;  Laterality: N/A;  . HEMORRHOID SURGERY N/A 05/14/2017   Procedure: EXTERNAL THROMBOSED HEMORRHOIDECTOMY;  Surgeon: Robert Bellow, MD;  Location: [facility] ORS;  Service: General;  Laterality: N/A;  . HIP ARTHROPLASTY  Right 02/17/2017   Procedure: ARTHROPLASTY BIPOLAR HIP (HEMIARTHROPLASTY);  Surgeon: Claud Kelp, MD;  Location: [facility] ORS;  Service: Orthopedics;  Laterality: Right;  . LEFT HEART CATH AND CORONARY ANGIOGRAPHY N/A 08/31/2016   Procedure: Left Heart Cath and Coronary Angiography;  Surgeon: Wellington Hampshire, MD;  Location: [place] CV LAB;  Service:  Cardiovascular;  Laterality: N/A;  . ORIF PERIPROSTHETIC FRACTURE Right 05/27/2017  . ORIF PERIPROSTHETIC FRACTURE Right 05/27/2017   Procedure: OPEN REDUCTION INTERNAL FIXATION (ORIF) PERIPROSTHETIC FRACTURE;  Surgeon: Mcarthur Rossetti, MD;  Location: [place];  Service: Orthopedics;  Laterality: Right;  . TOTAL HIP ARTHROPLASTY Right   . uterian prolapse       Current Outpatient Medications  Medication Sig Dispense Refill  . atorvastatin (LIPITOR) 40 MG tablet TAKE ONE TABLET BY MOUTH EVERY DAY 90 tablet 0  . clindamycin (CLEOCIN) 300 MG capsule 2 capsules one hour before dental procedure 2 capsule 2  . FLORA-Q (FLORA-Q) CAPS capsule Take 1 capsule by mouth daily.    [name] glipiZIDE (GLUCOTROL) 5 MG tablet Take 0.5 tablets (2.5 mg total) by mouth daily before breakfast. (Patient taking differently: Take 2.5 mg by mouth every evening. ) 90 tablet 1  . glucose blood (BAYER CONTOUR TEST) test strip Use as instructed 100 each 5  . hydrALAZINE (APRESOLINE) 50 MG tablet Take 1 tablet (50 mg total) by mouth 3 (three) times daily. (Patient taking differently: Take 25 mg by mouth 3 (three) times daily. ) 270 tablet 3  . isosorbide mononitrate (IMDUR) 60 MG 24 hr tablet Take 1 tablet (60 mg total) by mouth daily. 90 tablet 1  . loratadine (CLARITIN) 10 MG tablet Take 10 mg daily by mouth.     . losartan (COZAAR) 100 MG tablet Take 100 mg by mouth daily.    . metoprolol succinate (TOPROL-XL) 100 MG 24 hr tablet Take 1 tablet (100 mg total) by mouth daily. With or following a meal 90 tablet 1  . nitroGLYCERIN (NITROSTAT) 0.4 MG SL tablet Place 1 tablet (0.4 mg total) under the tongue every 5 (five) minutes as needed for chest pain. 10 tablet 0  . sertraline (ZOLOFT) 50 MG tablet Take 1 tablet (50 mg total) by mouth daily. (Patient taking differently: Take 75 mg by mouth daily. ) 30 tablet 0  . vitamin C (ASCORBIC ACID) 500 MG tablet Take 1 tablet (500 mg total) by mouth 2 (two) times daily. 60 tablet 2     No current facility-administered medications for this visit.     Allergies:   Asa [aspirin]; Aspirin; Atropine; Belladonna alkaloids; Codeine; Darvon; Darvon [propoxyphene]; Demerol [meperidine]; Ferrous sulfate; Meperidine and related; Methocarbamol; Penicillins; Penicillins; Sulfa antibiotics; and Iron    Social History:  The patient  reports that she has never smoked. She has never used smokeless tobacco. She reports that she does not drink alcohol or use drugs.   Family History:  The patient's family history includes Breast cancer (age of onset: 16) in her sister; Breast cancer (age of onset: 33) in her mother; Cancer in her mother; Colon cancer in her brother, brother, and father; Stroke in her sister.    ROS:  Please see the history of present illness.   Otherwise, review of systems are positive for none.   All other systems are reviewed and negative.    PHYSICAL EXAM: VS:  BP (!) 190/78 (BP Location: Left Arm, Patient Position: Sitting, Cuff Size: Normal)   Pulse 63   Ht 5\' 1"  (1.549 m)  Wt 132 lb 8 oz (60.1 kg)   BMI 25.04 kg/m  , BMI Body mass index is 25.04 kg/m. GEN: Well nourished, well developed, in no acute distress  HEENT: normal  Neck: no JVD, carotid bruits, or masses Cardiac: RRR; no rubs, or gallops, mild bilateral leg edema . 3/6 crescendo decrescendo systolic murmur in the aortic area which is mid peaking Respiratory:  clear to auscultation bilaterally, normal work of breathing GI: soft, nontender, nondistended, + BS MS: no deformity or atrophy  Skin: warm and dry, no rash Neuro:  Strength and sensation are intact Psych: euthymic mood, full affect    EKG:  EKG is ordered today. The ekg ordered today demonstrates normal sinus rhythm with sinus arrhythmia.  Low voltage   Recent Labs: 11/03/2016: Magnesium 1.7 05/27/2017: B Natriuretic Peptide 221.6 05/31/2017: ALT 21 06/08/2017: BUN 31; Creatinine, Ser 1.18; Hemoglobin 9.7; Platelets 468; Potassium 4.0;  Sodium 139    Lipid Panel    Component Value Date/Time   CHOL 132 12/04/2016 0808   TRIG 128.0 12/04/2016 0808   HDL 38.30 (L) 12/04/2016 0808   CHOLHDL 3 12/04/2016 0808   VLDL 25.6 12/04/2016 0808   LDLCALC 68 12/04/2016 0808   LDLDIRECT 92.0 07/06/2016 1059      Wt Readings from Last 3 Encounters:  10/05/17 132 lb 8 oz (60.1 kg)  09/07/17 130 lb (59 kg)  08/23/17 132 lb (59.9 kg)       No flowsheet data found.    ASSESSMENT AND PLAN:  1. Coronary artery disease involving native coronary arteries with angina: No further chest pain after increasing Toprol and Imdur .  Continue medical therapy  2. Mild to moderate aortic valve stenosis: Repeat echocardiogram in 6 months from now.  3. Essential hypertension: Blood pressure continues to be uncontrolled.  It is possible that hydralazine is causing her GI symptoms including diarrhea.  Thus, I do not want to increase the dose.  I would like to avoid amlodipine due to leg edema.  I elected to add small dose hydrochlorothiazide to losartan.  Check basic metabolic profile in 1 week to ensure stable renal function.  If she tolerates this, the dose can be increased to 25 mg.   If blood pressure remains uncontrolled, I recommend switching Toprol to carvedilol 12.5 mg twice daily  4. Hyperlipidemia: Continue treatment with atorvastatin. Most recent lipid profile showed an LDL of 68.  5.  Pulmonary hypertension: Was noted on most recent echocardiogram but I suspect that her blood pressure was significantly elevated at that time.  Will reevaluate with echocardiogram in 6 months.    Disposition:   FU  in 1 month.  Signed,  [name], MD  10/05/2017 3:44 PM    [place]

## 2017-10-05 NOTE — Patient Instructions (Signed)
Medication Instructions: START Losartan-Hydrochlorothiazide 100-12.5mg  daily STOP the Losartan  If you need a refill on your cardiac medications before your next appointment, please call your pharmacy.   Labwork: Your provider would like for you to return in one week to have the following labs drawn: BMET. Please go to the [facility] entrance and check in at the front desk. You do not need an appointment.   Follow-Up: Your physician wants you to follow-up in one month with Dr. Fletcher Anon.    Thank you for choosing [facility]!

## 2017-10-09 ENCOUNTER — Other Ambulatory Visit
Admission: RE | Admit: 2017-10-09 | Discharge: 2017-10-09 | Disposition: A | Discharge status: Home / Self Care | Payer: Medicare Other | Source: Ambulatory Visit | Attending: Cardiovascular Disease | Admitting: Cardiovascular Disease

## 2017-10-09 DIAGNOSIS — I10 Essential (primary) hypertension: Secondary | ICD-10-CM | POA: Insufficient documentation

## 2017-10-09 DIAGNOSIS — I25118 Atherosclerotic heart disease of native coronary artery with other forms of angina pectoris: Secondary | ICD-10-CM | POA: Diagnosis not present

## 2017-10-09 LAB — BASIC METABOLIC PANEL
ANION GAP: 11 (ref 5–15)
BUN: 36 mg/dL — ABNORMAL HIGH (ref 6–20)
CHLORIDE: 106 mmol/L (ref 101–111)
CO2: 20 mmol/L — AB (ref 22–32)
Calcium: 9.6 mg/dL (ref 8.9–10.3)
Creatinine, Ser: 0.85 mg/dL (ref 0.44–1.00)
GFR calc Af Amer: >60 mL/min (ref >60)
GFR calc non Af Amer: 58 mL/min — ABNORMAL LOW (ref >60)
Glucose, Bld: 69 mg/dL (ref 65–99)
Potassium: 3.7 mmol/L (ref 3.5–5.1)
Sodium: 137 mmol/L (ref 135–145)

## 2017-10-10 ENCOUNTER — Telehealth: Payer: Self-pay | Admitting: *Deleted

## 2017-10-10 MED ORDER — CARVEDILOL 12.5 MG PO TABS: 12.5000 mg | ORAL_TABLET | Freq: Two times a day (BID) | ORAL | 3 refills | Status: DC

## 2017-10-10 NOTE — Telephone Encounter (Signed)
No need to repeat basic metabolic profile now.  Continue losartan-hydrochlorothiazide 100-25 mg once daily.  Switch metoprolol to carvedilol 12.5 mg twice daily.  Continue to monitor blood pressure.

## 2017-10-10 NOTE — Telephone Encounter (Signed)
The patient is taking the Losartan- Hydrochlorothiazide 100-25 mg.   Current blood pressure was 176/72 and heart rate was 60 This was two hours after her morning medication.

## 2017-10-10 NOTE — Telephone Encounter (Signed)
I thought we placed her on Losartan/HCTZ 100/12.5 mg and not 25. Which dose is she taking?

## 2017-10-10 NOTE — Telephone Encounter (Signed)
Patient's daughter has been called and made aware. Metoprolol has been discontinued and Carvedilol 12.5 mg bid has been sent into the pharmacy. A call will be placed on Monday to check on the patient and her blood pressures.

## 2017-10-10 NOTE — Telephone Encounter (Signed)
Call placed to the patient's daughter for recent lab results. She stated that the patient had her labs drawn on Tuesday 6/11 but had recently started the losartan-hydrochlorothiazide Saturday morning. Per the office visit instructions, the patient was to have these labs drawn a week after starting the medication. She would like to know if she needs to have these drawn again.  She also stated that the patient is still having trouble with high blood pressure and lower extremity edema. She did say that the edema was down a little today. Her blood pressures have been running from 371-062 systolic. At night it has been running higher from 170-200. She did not have any specific readings. She has been advised that it may take time for the new medication to take effect.   She currently takes: losartan-hydrochlorothiazide 100-25 in the morning Metoprolol Succinate 100 mg in the morning Hydralazine 25 mg tid.

## 2017-10-11 ENCOUNTER — Emergency Department (HOSPITAL_COMMUNITY): Payer: Medicare Other

## 2017-10-11 ENCOUNTER — Emergency Department (HOSPITAL_COMMUNITY)
Admission: EM | Admit: 2017-10-11 | Discharge: 2017-10-12 | Disposition: A | Discharge status: Home / Self Care | Payer: Medicare Other | Attending: Emergency Medicine | Admitting: Emergency Medicine

## 2017-10-11 ENCOUNTER — Other Ambulatory Visit: Payer: Self-pay | Admitting: Internal Medicine

## 2017-10-11 ENCOUNTER — Other Ambulatory Visit: Payer: Self-pay

## 2017-10-11 DIAGNOSIS — I130 Hypertensive heart and chronic kidney disease with heart failure and stage 1 through stage 4 chronic kidney disease, or unspecified chronic kidney disease: Secondary | ICD-10-CM | POA: Insufficient documentation

## 2017-10-11 DIAGNOSIS — I5032 Chronic diastolic (congestive) heart failure: Secondary | ICD-10-CM | POA: Diagnosis not present

## 2017-10-11 DIAGNOSIS — Z79899 Other long term (current) drug therapy: Secondary | ICD-10-CM | POA: Diagnosis not present

## 2017-10-11 DIAGNOSIS — R0902 Hypoxemia: Secondary | ICD-10-CM | POA: Diagnosis not present

## 2017-10-11 DIAGNOSIS — N183 Chronic kidney disease, stage 3 (moderate): Secondary | ICD-10-CM | POA: Insufficient documentation

## 2017-10-11 DIAGNOSIS — I10 Essential (primary) hypertension: Secondary | ICD-10-CM | POA: Diagnosis not present

## 2017-10-11 DIAGNOSIS — E119 Type 2 diabetes mellitus without complications: Secondary | ICD-10-CM | POA: Insufficient documentation

## 2017-10-11 DIAGNOSIS — I959 Hypotension, unspecified: Secondary | ICD-10-CM | POA: Diagnosis not present

## 2017-10-11 DIAGNOSIS — R1111 Vomiting without nausea: Secondary | ICD-10-CM | POA: Diagnosis not present

## 2017-10-11 DIAGNOSIS — R55 Syncope and collapse: Secondary | ICD-10-CM | POA: Insufficient documentation

## 2017-10-11 DIAGNOSIS — I2510 Atherosclerotic heart disease of native coronary artery without angina pectoris: Secondary | ICD-10-CM | POA: Insufficient documentation

## 2017-10-11 DIAGNOSIS — J45909 Unspecified asthma, uncomplicated: Secondary | ICD-10-CM | POA: Diagnosis not present

## 2017-10-11 LAB — COMPREHENSIVE METABOLIC PANEL
ALBUMIN: 3.9 g/dL (ref 3.5–5.0)
ALK PHOS: 85 U/L (ref 38–126)
ALT: 33 U/L (ref 14–54)
ANION GAP: 11 (ref 5–15)
AST: 23 U/L (ref 15–41)
BUN: 38 mg/dL — AB (ref 6–20)
CALCIUM: 9.5 mg/dL (ref 8.9–10.3)
CO2: 19 mmol/L — AB (ref 22–32)
CREATININE: 0.98 mg/dL (ref 0.44–1.00)
Chloride: 107 mmol/L (ref 101–111)
GFR calc Af Amer: 56 mL/min — ABNORMAL LOW (ref >60)
GFR calc non Af Amer: 49 mL/min — ABNORMAL LOW (ref >60)
GLUCOSE: 191 mg/dL — AB (ref 65–99)
Potassium: 4.1 mmol/L (ref 3.5–5.1)
SODIUM: 137 mmol/L (ref 135–145)
TOTAL PROTEIN: 6.5 g/dL (ref 6.5–8.1)
Total Bilirubin: 0.5 mg/dL (ref 0.3–1.2)

## 2017-10-11 LAB — CBC WITH DIFFERENTIAL/PLATELET
ABS IMMATURE GRANULOCYTES: 0.2 10*3/uL — AB (ref 0.0–0.1)
Basophils Absolute: 0.1 10*3/uL (ref 0.0–0.1)
Basophils Relative: 0 %
EOS PCT: 3 %
Eosinophils Absolute: 0.3 10*3/uL (ref 0.0–0.7)
HEMATOCRIT: 31.1 % — AB (ref 36.0–46.0)
HEMOGLOBIN: 10.2 g/dL — AB (ref 12.0–15.0)
Immature Granulocytes: 2 %
LYMPHS PCT: 10 %
Lymphs Abs: 1.3 10*3/uL (ref 0.7–4.0)
MCH: 28.3 pg (ref 26.0–34.0)
MCHC: 32.8 g/dL (ref 30.0–36.0)
MCV: 86.4 fL (ref 78.0–100.0)
MONO ABS: 0.9 10*3/uL (ref 0.1–1.0)
Monocytes Relative: 7 %
NEUTROS ABS: 10.6 10*3/uL — AB (ref 1.7–7.7)
Neutrophils Relative %: 78 %
Platelets: 211 10*3/uL (ref 150–400)
RBC: 3.6 MIL/uL — AB (ref 3.87–5.11)
RDW: 13.8 % (ref 11.5–15.5)
WBC: 13.3 10*3/uL — AB (ref 4.0–10.5)

## 2017-10-11 LAB — URINALYSIS, ROUTINE W REFLEX MICROSCOPIC
Bilirubin Urine: NEGATIVE
GLUCOSE, UA: NEGATIVE mg/dL
Hgb urine dipstick: NEGATIVE
KETONES UR: NEGATIVE mg/dL
Leukocytes, UA: NEGATIVE
Nitrite: NEGATIVE
PROTEIN: 30 mg/dL — AB
Specific Gravity, Urine: 1.012 (ref 1.005–1.030)
pH: 5 (ref 5.0–8.0)

## 2017-10-11 LAB — I-STAT TROPONIN, ED: Troponin i, poc: 0.01 ng/mL (ref 0.00–0.08)

## 2017-10-11 NOTE — ED Provider Notes (Signed)
[facility] EMERGENCY DEPARTMENT Provider Note   CSN: [id number] Arrival date & time: 10/11/17  2010     History   Chief Complaint Chief Complaint  Patient presents with  . Loss of Consciousness    HPI Maureen Morales is a 84 y.o. female.  HPI  84 yo female ho cad, hypertension, asthma , t2dm, mi, presents today with onset of nausea and vomiting f/b near syncope and generalized weakness.  Patient was going out to eat with family and was walking in to restaurant. Patient complaining of generalized weakness.  Denies chest pain, fever, chills,or abdominal pain.  She complains of some headache. Daughter reports change in bp medications with carvedilol started just prior to going out to eat.   Past Medical History:  Diagnosis Date  . Asthma without status asthmaticus    unspecified  . CAD (coronary artery disease)   . Colitis    unspecified  . Complication of anesthesia    sensitive to anesthesia  . Depression   . Diabetes mellitus   . Diabetes mellitus type 2, uncomplicated ([place])   . Essential hypertension   . GERD (gastroesophageal reflux disease)   . 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 HLD (hyperlipidemia)   . Hyperlipidemia    unspecified  . Hypertension   . Myocardial infarction ([place]) 2018  . Peripheral vascular disease ([place])   . Positive H. pylori test   . Viral gastroenteritis due to [place]-like agent Nov 2012    Patient Active Problem List   Diagnosis Date Noted  . Severe tricuspid regurgitation 09/03/2017  . Mitral and aortic valve regurgitation 09/03/2017  . Diastolic dysfunction without heart failure 09/03/2017  . Hypertensive crisis   . Labile blood pressure   . Hypokalemia   . Hypoalbuminemia due to protein-calorie malnutrition ([place])   . Intermittent asthma without complication   . Stage 3 chronic kidney disease ([place])   . Chronic diastolic congestive heart failure ([place])   . Periprosthetic fracture around internal  prosthetic right hip joint ([place]) 05/30/2017  . Decubitus ulcer of right heel, stage 3 ([place])   . Anal fistula   . History of right hip hemiarthroplasty   . Benign essential HTN   . Acute blood loss anemia   . Diabetes mellitus type 2 in nonobese (H[facility])   . AKI (acute kidney injury) ([place])   . Post-operative pain   . Fall 05/26/2017  . Closed right hip fracture ([place]) 05/26/2017  . Normocytic anemia 05/26/2017  . Abdominal distention 05/26/2017  . CAD (coronary artery disease) 05/26/2017  . Depression   . HLD (hyperlipidemia)   . Essential hypertension   . 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 Closed displaced fracture of right femoral neck ([place]) 02/16/2017  . Abnormal CT scan, colon   . Generalized anxiety disorder 09/07/2016  . Unsteady gait 09/07/2016  . NSTEMI (non-ST elevated myocardial infarction) ([place]) 08/30/2016  . Pain in joint, ankle and foot 06/24/2014  . S/P laparoscopic cholecystectomy 12/15/2013  . Preoperative general physical examination 12/15/2013  . Encounter for preventive health examination 11/13/2013  . Cervical spine disease 01/27/2013  . Anemia, iron deficiency 01/21/2013  . Right medial knee pain 01/09/2013  . Back pain, thoracic 08/27/2012  . Moderate aortic stenosis by prior echocardiogram 05/06/2012  . Asthma 07/04/2011  . Hemorrhoids, internal 04/05/2011  . Nummular eczema 01/16/2011  . Essential hypertension   . Type II diabetes mellitus with nephropathy ([place])   .  Hyperlipidemia     Past Surgical History:  Procedure Laterality Date  . ABDOMINAL HYSTERECTOMY    . BLADDER SURGERY    . CARDIAC CATHETERIZATION    . CHOLECYSTECTOMY    . COLONOSCOPY     06/16/1987, 02/20/1995, 05/04/1999, 02/14/2005, 05/17/2007  . ESOPHAGOGASTRODUODENOSCOPY     05/11/1987, 06/13/1995, 10/09/1995, 05/04/1999, 01/16/2002  . FLEXIBLE SIGMOIDOSCOPY N/A 05/02/2017   Procedure: FLEXIBLE SIGMOIDOSCOPY;  Surgeon: Manya Silvas, MD;  Location[facility];  Service: Endoscopy;  Laterality: N/A;  . HEMORRHOID SURGERY N/A 05/14/2017   Procedure: EXTERNAL THROMBOSED HEMORRHOIDECTOMY;  Surgeon: Robert Bellow, MD;  Location: [facility] ORS;  Service: General;  Laterality: N/A;  . HIP ARTHROPLASTY Right 02/17/2017   Procedure: ARTHROPLASTY BIPOLAR HIP (HEMIARTHROPLASTY);  Surgeon: Claud Kelp, MD;  Location: [facility] ORS;  Service: Orthopedics;  Laterality: Right;  . LEFT HEART CATH AND CORONARY ANGIOGRAPHY N/A 08/31/2016   Procedure: Left Heart Cath and Coronary Angiography;  Surgeon: Wellington Hampshire, MD;  Location: [facility] CV LAB;  Service: Cardiovascular;  Laterality: N/A;  . ORIF PERIPROSTHETIC FRACTURE Right 05/27/2017  . ORIF PERIPROSTHETIC FRACTURE Right 05/27/2017   Procedure: OPEN REDUCTION INTERNAL FIXATION (ORIF) PERIPROSTHETIC FRACTURE;  Surgeon: Mcarthur Rossetti, MD;  Location: [facility];  Service: Orthopedics;  Laterality: Right;  . TOTAL HIP ARTHROPLASTY Right   . uterian prolapse       OB History    Gravida  5   Para  4   Term  0   Preterm  0   AB  0   Living        SAB  0   TAB  0   Ectopic  0   Multiple      Live Births           Obstetric Comments  1st Menstrual Cycle:   16 1st Pregnancy:  27          Home Medications    Prior to Admission medications   Medication Sig Start Date End Date Taking? Authorizing Provider  atorvastatin (LIPITOR) 40 MG tablet TAKE ONE TABLET BY MOUTH EVERY DAY 09/27/17   [name], MD  carvedilol (COREG) 12.5 MG tablet Take 1 tablet (12.5 mg total) by mouth 2 (two) times daily. 10/10/17 01/08/18  Wellington Hampshire, MD  clindamycin (CLEOCIN) 300 MG capsule 2 capsules one hour before dental procedure 06/18/17   [name], MD  FLORA-Q [facility]) CAPS capsule Take 1 capsule by mouth daily.    [provider]  glipiZIDE (GLUCOTROL) 5 MG tablet Take 0.5 tablets (2.5 mg total) by mouth daily before breakfast. Patient taking  differently: Take 2.5 mg by mouth every evening.  05/30/17   [name], MD  glucose blood (BAYER CONTOUR TEST) test strip Use as instructed 05/30/17   [name], MD  hydrALAZINE (APRESOLINE) 50 MG tablet Take 1 tablet (50 mg total) by mouth 3 (three) times daily. Patient taking differently: Take 25 mg by mouth 3 (three) times daily.  08/31/17 11/29/17  Wellington Hampshire, MD  isosorbide mononitrate (IMDUR) 60 MG 24 hr tablet Take 1 tablet (60 mg total) by mouth daily. 09/07/17 12/06/17  Wellington Hampshire, MD  loratadine (CLARITIN) 10 MG tablet Take 10 mg daily by mouth.     [provider]  losartan-hydrochlorothiazide (HYZAAR) 100-25 MG tablet Take 1 tablet by mouth daily. 10/05/17   Wellington Hampshire, MD  nitroGLYCERIN (NITROSTAT) 0.4 MG SL tablet Place 1 tablet (0.4 mg  total) under the tongue every 5 (five) minutes as needed for chest pain. 09/02/16   Nicholes Mango, MD  sertraline (ZOLOFT) 50 MG tablet Take 1 tablet (50 mg total) by mouth daily. Patient taking differently: Take 75 mg by mouth daily.  06/13/17   Love, Ivan Anchors, PA-C  vitamin C (ASCORBIC ACID) 500 MG tablet Take 1 tablet (500 mg total) by mouth 2 (two) times daily. 05/04/17   [name], MD    Family History Family History  Problem Relation Age of Onset  . Breast cancer Mother 51  . Colon cancer Father   . Stroke Sister   . Cancer Mother   . Breast cancer Sister 64  . Colon cancer Brother   . Colon cancer Brother     Social History Social History   Tobacco Use  . Smoking status: Never Smoker  . Smokeless tobacco: Never Used  Substance Use Topics  . Alcohol use: No    Frequency: Never  . Drug use: No     Allergies   Asa [aspirin]; Aspirin; Atropine; Belladonna alkaloids; Codeine; Darvon; Darvon [propoxyphene]; Demerol [meperidine]; Ferrous sulfate; Meperidine and related; Methocarbamol; Penicillins; Penicillins; Sulfa antibiotics; and Iron   Review of Systems Review of Systems   Physical  Exam Updated Vital Signs BP (!) 148/69   Pulse 66   Temp 97.6 F (36.4 C) (Oral)   Resp 18   SpO2 99%   Physical Exam   ED Treatments / Results  Labs (all labs ordered are listed, but only abnormal results are displayed) Labs Reviewed  CBC WITH DIFFERENTIAL/PLATELET  COMPREHENSIVE METABOLIC PANEL  URINALYSIS, ROUTINE W REFLEX MICROSCOPIC  I-STAT TROPONIN, ED    EKG EKG Interpretation  Date/Time:  Thursday October 11 2017 20:30:22 EDT Ventricular Rate:  66 PR Interval:    QRS Duration: 88 QT Interval:  447 QTC Calculation: 469 R Axis:   83 Text Interpretation:  Sinus rhythm Borderline right axis deviation Confirmed by Pattricia Boss [phone]) on 10/11/2017 8:32:10 PM   Radiology No results found.  Procedures Procedures (including critical care time)  Medications Ordered in ED Medications - No data to display   Initial Impression / Assessment and Plan / ED Course  I have reviewed the triage vital signs and the nursing notes.  Pertinent labs & imaging results that were available during my care of the patient were reviewed by me and considered in my medical decision making (see chart for details).     84 year old female presents today after near syncopal episode event Differential diagnosis cardiac ischemia-EKG stable and troponin normal Intracranial abnormality-CT pending Electrolyte abnormality chemistries are pending Infection patient afebrile here with mild leukocytosis.  No evidence of urinary tract infection or infiltrate on chest x-[place] With change in medications and new beta-blocker could be source An admission for further evaluation Discussed with Quincy Carnes, PA-C and she will call up on remaining labs  Final Clinical Impressions(s) / ED Diagnoses   Final diagnoses:  Near syncope    ED Discharge Orders    None       Pattricia Boss, MD 10/11/17 [id number]

## 2017-10-11 NOTE — ED Triage Notes (Signed)
CC of syncope. Family caught patient, pt did not fall. Pt complaint of generalized weakness. Pt stated has recently started new BP meds. Two episodes of vomiting reported by EMS.

## 2017-10-11 NOTE — ED Notes (Signed)
Nurse starting IV and will draw labs. 

## 2017-10-11 NOTE — ED Provider Notes (Signed)
Assumed care from Dr. Jeanell Sparrow at shift change.  See prior notes for full H&P.  Briefly, 84 y.o. F here with near syncope.  PCP changed meds today from lopressor to carvedilol today, she took it on empty stomach before going out to eat.  Abdominal pain and vomited x2, little confusion and generalized weakness at restaurant so paramedics called.  Plan:  Labs and imaging pending.  Will need admission for syncope.   Results for orders placed or performed during the hospital encounter of 10/11/17  CBC with Differential/Platelet  Result Value Ref Range   WBC 13.3 (H) 4.0 - 10.5 K/uL   RBC 3.60 (L) 3.87 - 5.11 MIL/uL   Hemoglobin 10.2 (L) 12.0 - 15.0 g/dL   HCT 31.1 (L) 36.0 - 46.0 %   MCV 86.4 78.0 - 100.0 fL   MCH 28.3 26.0 - 34.0 pg   MCHC 32.8 30.0 - 36.0 g/dL   RDW 13.8 11.5 - 15.5 %   Platelets 211 150 - 400 K/uL   Neutrophils Relative % 78 %   Neutro Abs 10.6 (H) 1.7 - 7.7 K/uL   Lymphocytes Relative 10 %   Lymphs Abs 1.3 0.7 - 4.0 K/uL   Monocytes Relative 7 %   Monocytes Absolute 0.9 0.1 - 1.0 K/uL   Eosinophils Relative 3 %   Eosinophils Absolute 0.3 0.0 - 0.7 K/uL   Basophils Relative 0 %   Basophils Absolute 0.1 0.0 - 0.1 K/uL   Immature Granulocytes 2 %   Abs Immature Granulocytes 0.2 (H) 0.0 - 0.1 K/uL  Comprehensive metabolic panel  Result Value Ref Range   Sodium 137 135 - 145 mmol/L   Potassium 4.1 3.5 - 5.1 mmol/L   Chloride 107 101 - 111 mmol/L   CO2 19 (L) 22 - 32 mmol/L   Glucose, Bld 191 (H) 65 - 99 mg/dL   BUN 38 (H) 6 - 20 mg/dL   Creatinine, Ser 0.98 0.44 - 1.00 mg/dL   Calcium 9.5 8.9 - 10.3 mg/dL   Total Protein 6.5 6.5 - 8.1 g/dL   Albumin 3.9 3.5 - 5.0 g/dL   AST 23 15 - 41 U/L   ALT 33 14 - 54 U/L   Alkaline Phosphatase 85 38 - 126 U/L   Total Bilirubin 0.5 0.3 - 1.2 mg/dL   GFR calc non Af Amer 49 (L) >60 mL/min   GFR calc Af Amer 56 (L) >60 mL/min   Anion gap 11 5 - 15  Urinalysis, Routine w reflex microscopic  Result Value Ref Range   Color,  Urine YELLOW YELLOW   APPearance CLEAR CLEAR   Specific Gravity, Urine 1.012 1.005 - 1.030   pH 5.0 5.0 - 8.0   Glucose, UA NEGATIVE NEGATIVE mg/dL   Hgb urine dipstick NEGATIVE NEGATIVE   Bilirubin Urine NEGATIVE NEGATIVE   Ketones, ur NEGATIVE NEGATIVE mg/dL   Protein, ur 30 (A) NEGATIVE mg/dL   Nitrite NEGATIVE NEGATIVE   Leukocytes, UA NEGATIVE NEGATIVE   RBC / HPF 0-5 0 - 5 RBC/hpf   WBC, UA 0-5 0 - 5 WBC/hpf   Bacteria, UA RARE (A) NONE SEEN   Mucus PRESENT    Hyaline Casts, UA PRESENT   I-stat troponin, ED  Result Value Ref Range   Troponin i, poc 0.01 0.00 - 0.08 ng/mL   Comment 3           Ct Head Wo Contrast  Result Date: 10/11/2017 CLINICAL DATA:  Syncope. EXAM: CT HEAD WITHOUT CONTRAST TECHNIQUE:  Contiguous axial images were obtained from the base of the skull through the vertex without intravenous contrast. COMPARISON:  January 05, 2017 FINDINGS: Brain: No subdural, epidural, or subarachnoid hemorrhage. Ventricles and sulci are unremarkable. Cerebellum, brainstem, and basal cisterns are normal. No mass effect or midline shift. No acute cortical ischemia or infarct. Vascular: Calcified atherosclerosis in the intracranial carotids. Skull: Normal. Negative for fracture or focal lesion. Sinuses/Orbits: Minimal fluid in the left maxillary sinus. Paranasal sinuses, mastoid air cells, and middle ears are otherwise normal. Other: None. IMPRESSION: 1. No acute intracranial abnormality. 2. Minimal fluid in left maxillary sinus, nonspecific. Electronically Signed   By: Dorise Bullion III M.D   On: 10/11/2017 23:15   Dg Chest Port 1 View  Result Date: 10/11/2017 CLINICAL DATA:  Syncope.  Weakness.  Two episodes of vomiting. EXAM: PORTABLE CHEST 1 VIEW COMPARISON:  02/16/2017, 11/01/2016 and 08/30/2016 FINDINGS: Lungs are adequately inflated with subtle focal interstitial prominence. Mild stable cardiomegaly. Remainder of the exam is unchanged. IMPRESSION: No acute findings.  Electronically Signed   By: Marin Olp M.D.   On: 10/11/2017 21:33    11:32 PM Patient's results are overall reassuring.  BUN slightly elevated but not far off baseline.  Upon re-evaluation, patient PCP, Dr. Derrel Nip, has actually come to the ED to see her.  Dr. Jeanell Sparrow had expressed some concern over her symptoms and was leaning towards admission.  Upon discussing this with patient, she wants to go home.  PCP feels that it is related to BP medication changes.  She lives near patient and has a very close relationship with patient and family, she will go by patient's house tomorrow for re-check.  Feel this is reasonable.  Patient and family understand to return here for any new/acute changes.   Larene Pickett, PA-C 10/12/17 Alena Bills    Veryl Speak, MD 10/12/17 [phone]

## 2017-10-11 NOTE — ED Notes (Signed)
Delay in lab draw,  Pt receiving peri care.

## 2017-10-12 ENCOUNTER — Telehealth: Payer: Self-pay | Admitting: Cardiovascular Disease

## 2017-10-12 NOTE — Discharge Instructions (Signed)
Follow-up closely with your primary care doctor. Return here for any new/acute changes.

## 2017-10-12 NOTE — Telephone Encounter (Signed)
Caregivers checked her SBP was 195 and Dr. Derrel Nip advised that she take her medications. She took her new pill of carvedilol and they went out to dinner. When they arrived she became nauseated with weakness and was taken to ED. EKG and blood work was done. WBC was elevated. They feel that it was the new medication and had not eaten. They held the carvedilol and she is back on the metoprolol. They did note her feet are still swollen as well. Reviewed instructions to call or go to ED for evaluation if her symptoms persist of worsen. She reports Dr. Derrel Nip was also going to check in with her as well. Instructed her to also monitor blood pressures and keep a log of those readings. Advised that I would send this note over to the provider for review and recommendations and that someone would be in touch with her. She verbalized understanding with no further questions at this time.

## 2017-10-12 NOTE — Telephone Encounter (Signed)
Pt daughter calling stating patient ended up in ED at [facility] from taking carvedilol 12.5 mg  She took it last night for the first time They were going out to dinner and she took it before Once they got to the restaurant she about passed out and they caught her Dr Derrel Nip was there and states the dose may be a bit strong for patient  Would like to know what can we do to prevent this   Please advise

## 2017-10-12 NOTE — Telephone Encounter (Signed)
I left a message to Dr. Derrel Nip to discuss the patient's case.  We can continue metoprolol for now and stop carvedilol.  One consideration is to switch metoprolol to Bystolic 5 mg once daily and increase the dose gradually as tolerated.

## 2017-10-15 ENCOUNTER — Other Ambulatory Visit: Payer: Self-pay | Admitting: Internal Medicine

## 2017-10-15 DIAGNOSIS — R350 Frequency of micturition: Secondary | ICD-10-CM

## 2017-10-15 MED ORDER — METOPROLOL SUCCINATE ER 100 MG PO TB24: 100.0000 mg | ORAL_TABLET | Freq: Every day | ORAL | 1 refills | Status: DC

## 2017-10-15 NOTE — Telephone Encounter (Signed)
Left a message to call back.

## 2017-10-15 NOTE — Telephone Encounter (Signed)
Call placed to the daughter to check on the patient. Please see prior epic note concerning the carvedilol.   She stated that the patient felt better since the weekend. She did add that the patient's swelling had gone down some and felt like there was some progress.  She has been instructed to call if there is anything further that is needed. Appointment on 7/9 with Murray Hodgkins, NP.

## 2017-10-15 NOTE — Telephone Encounter (Signed)
Spoke with patients daughter and reviewed Dr. Tyrell Antonio recommendations and that he would also discuss with Dr. Derrel Nip. For now discontinued carvedilol she resumed metoprolol succinate 100 mg once daily and will wait for any further recommendations. She was appreciative for the call with no further questions at this time.

## 2017-10-15 NOTE — Addendum Note (Signed)
Addended by: Valora Corporal on: 10/15/2017 10:39 AM   Modules accepted: Orders

## 2017-10-18 ENCOUNTER — Telehealth: Payer: Self-pay | Admitting: Internal Medicine

## 2017-10-18 ENCOUNTER — Other Ambulatory Visit: Payer: Self-pay | Admitting: Internal Medicine

## 2017-10-18 NOTE — Telephone Encounter (Signed)
Patient is  Well known to me and has new onset urinary frequency and incontinence. Wants to drop off urine.  Urine testing ordered.  She does not need to see me since I see her frequently in her home due to travel difficulties.

## 2017-10-19 ENCOUNTER — Other Ambulatory Visit (INDEPENDENT_AMBULATORY_CARE_PROVIDER_SITE_OTHER): Payer: Medicare Other

## 2017-10-19 DIAGNOSIS — R350 Frequency of micturition: Secondary | ICD-10-CM | POA: Diagnosis not present

## 2017-10-19 LAB — URINALYSIS, ROUTINE W REFLEX MICROSCOPIC
BILIRUBIN URINE: NEGATIVE
Ketones, ur: NEGATIVE
Nitrite: POSITIVE — AB
Specific Gravity, Urine: 1.02 (ref 1.000–1.030)
Total Protein, Urine: 100 — AB
Urine Glucose: NEGATIVE
Urobilinogen, UA: 0.2 (ref 0.0–1.0)
pH: 5.5 (ref 5.0–8.0)

## 2017-10-19 MED ORDER — CIPROFLOXACIN HCL 250 MG PO TABS: 250.0000 mg | ORAL_TABLET | Freq: Two times a day (BID) | ORAL | 0 refills | Status: DC

## 2017-10-19 NOTE — Addendum Note (Signed)
Addended by: [name] on: 10/19/2017 05:22 PM   Modules accepted: Orders

## 2017-10-21 LAB — URINE CULTURE
MICRO NUMBER:: 90745148
SPECIMEN QUALITY: ADEQUATE

## 2017-10-29 ENCOUNTER — Ambulatory Visit (INDEPENDENT_AMBULATORY_CARE_PROVIDER_SITE_OTHER): Payer: Medicare Other

## 2017-10-29 ENCOUNTER — Other Ambulatory Visit: Payer: Self-pay

## 2017-10-29 DIAGNOSIS — I350 Nonrheumatic aortic (valve) stenosis: Secondary | ICD-10-CM | POA: Diagnosis not present

## 2017-10-31 ENCOUNTER — Encounter (INDEPENDENT_AMBULATORY_CARE_PROVIDER_SITE_OTHER): Payer: Self-pay | Admitting: Orthopaedic Surgery

## 2017-10-31 ENCOUNTER — Ambulatory Visit (INDEPENDENT_AMBULATORY_CARE_PROVIDER_SITE_OTHER): Payer: Medicare Other | Admitting: Orthopaedic Surgery

## 2017-10-31 ENCOUNTER — Ambulatory Visit (INDEPENDENT_AMBULATORY_CARE_PROVIDER_SITE_OTHER): Payer: Medicare Other

## 2017-10-31 DIAGNOSIS — M9701XD Periprosthetic fracture around internal prosthetic right hip joint, subsequent encounter: Secondary | ICD-10-CM

## 2017-10-31 DIAGNOSIS — I25118 Atherosclerotic heart disease of native coronary artery with other forms of angina pectoris: Secondary | ICD-10-CM

## 2017-10-31 NOTE — Progress Notes (Signed)
Office Visit Note   Patient: Maureen Morales           Date of Birth: 01/03/1923           The periprosthetic fracture of the right femur is healed completely.  There is no evidence of hardware failure.  I do feel that her MRN: [id number] Visit Date: 10/31/2017              Requested by: [name], MD [address] [place], [place] [address] PCP: [name], MD   Assessment & Plan: Visit Diagnoses:  1. Periprosthetic fracture around internal prosthetic right hip joint, subsequent encounter     Plan: At this point I gave him reassurance that the fracture itself is healed.  I feel the most of her pain is coming from her hemiarthroplasty.  It is a small component and is likely loose based on her fracture.  I would only recommend a conversion to a larger femoral component if this becomes debilitating for her and it definitely affects her quality of life.  The family understands this as well.  Follow-up will be as needed.  Follow-Up Instructions: Return if symptoms worsen or fail to improve.   Orders:  Orders Placed This Encounter  Procedures  . XR FEMUR, MIN 2 VIEWS RIGHT   No orders of the defined types were placed in this encounter.     Procedures: No procedures performed   Clinical Data: No additional findings.   Subjective: Chief Complaint  Patient presents with  . Right Leg - Follow-up  The patient is now 5 months status post open reduction internal fixation of a right periprosthetic femur fracture.  This was around a hip hemiarthroplasty.  The original hemiarthroplasty was done through posterior approach at [facility].  She then fell and broke around the prosthesis.  We were able to reapproximate her femur with a large plate and screws.  This also involved cable plating.  She is walking with a regular walker and having some thigh pain and groin pain.  She is able to ambulate and is made good progress.  The pain is mainly  activity related.  HPI  Review of Systems She currently denies any headache, chest pain, shortness of breath, fever, chills, nausea, vomiting.  She is alert and oriented x3 and in no acute distress.  She is ambulating slowly with a walker.  Objective: Vital Signs: There were no vitals taken for this visit.  Physical Exam See above Ortho Exam Examination of her right hip shows almost full internal and external rotation with slight pain in the groin.  Her incisions well-healed on the lateral aspect of her femur.  She has good motion of her knee foot and ankle.  I stressed the fracture area and this causes minimal discomfort. Specialty Comments:  No specialty comments available.  Imaging: Xr Femur, Min 2 Views Right  Result Date: 10/31/2017 2 views of the right hip and femur show healed periprosthetic fracture around a right hip hemiarthroplasty.  The plate and screws are intact and there is no evidence of hardware failure.  The hemiarthroplasty is well located.  The femoral component is in varus alignment.    PMFS History: Patient Active Problem List   Diagnosis Date Noted  . Severe tricuspid regurgitation 09/03/2017  . Mitral and aortic valve regurgitation 09/03/2017  . Diastolic dysfunction without heart failure 09/03/2017  . Hypertensive crisis   . Labile blood pressure   . Hypokalemia   .  Hypoalbuminemia due to protein-calorie malnutrition ([place])   . Intermittent asthma without complication   . Stage 3 chronic kidney disease ([place])   . Chronic diastolic congestive heart failure ([place])   . Periprosthetic fracture around internal prosthetic right hip joint ([place]) 05/30/2017  . Decubitus ulcer of right heel, stage 3 ([place])   . Anal fistula   . History of right hip hemiarthroplasty   . Benign essential HTN   . Acute blood loss anemia   . Diabetes mellitus type 2 in nonobese (HCC)   . AKI (acute kidney injury) ([place])   . Post-operative pain   . Fall 05/26/2017  . Closed right hip  fracture ([place]) 05/26/2017  . Normocytic anemia 05/26/2017  . Abdominal distention 05/26/2017  . CAD (coronary artery disease) 05/26/2017  . Depression   . HLD (hyperlipidemia)   . Essential hypertension   . 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 Closed displaced fracture of right femoral neck ([place]) 02/16/2017  . Abnormal CT scan, colon   . Generalized anxiety disorder 09/07/2016  . Unsteady gait 09/07/2016  . NSTEMI (non-ST elevated myocardial infarction) ([place]) 08/30/2016  . Pain in joint, ankle and foot 06/24/2014  . S/P laparoscopic cholecystectomy 12/15/2013  . Preoperative general physical examination 12/15/2013  . Encounter for preventive health examination 11/13/2013  . Cervical spine disease 01/27/2013  . Anemia, iron deficiency 01/21/2013  . Right medial knee pain 01/09/2013  . Back pain, thoracic 08/27/2012  . Moderate aortic stenosis by prior echocardiogram 05/06/2012  . Asthma 07/04/2011  . Hemorrhoids, internal 04/05/2011  . Nummular eczema 01/16/2011  . Essential hypertension   . Type II diabetes mellitus with nephropathy ([place])   . Hyperlipidemia    Past Medical History:  Diagnosis Date  . Asthma without status asthmaticus    unspecified  . CAD (coronary artery disease)   . Colitis    unspecified  . Complication of anesthesia    sensitive to anesthesia  . Depression   . Diabetes mellitus   . Diabetes mellitus type 2, uncomplicated ([place])   . Essential hypertension   . GERD (gastroesophageal reflux disease)   . 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 HLD (hyperlipidemia)   . Hyperlipidemia    unspecified  . Hypertension   . Myocardial infarction ([place]) 2018  . Peripheral vascular disease ([place])   . Positive H. pylori test   . Viral gastroenteritis due to Norwalk-like agent Nov 2012    Family History  Problem Relation Age of Onset  . Breast cancer Mother 80  . Colon cancer Father   . Stroke Sister     . Cancer Mother   . Breast cancer Sister 54  . Colon cancer Brother   . Colon cancer Brother     Past Surgical History:  Procedure Laterality Date  . ABDOMINAL HYSTERECTOMY    . BLADDER SURGERY    . CARDIAC CATHETERIZATION    . CHOLECYSTECTOMY    . COLONOSCOPY     06/16/1987, 02/20/1995, 05/04/1999, 02/14/2005, 05/17/2007  . ESOPHAGOGASTRODUODENOSCOPY     05/11/1987, 06/13/1995, 10/09/1995, 05/04/1999, 01/16/2002  . FLEXIBLE SIGMOIDOSCOPY N/A 05/02/2017   Procedure: FLEXIBLE SIGMOIDOSCOPY;  Surgeon: Manya Silvas, MD;  Location: [facility] ENDOSCOPY;  Service: Endoscopy;  Laterality: N/A;  . HEMORRHOID SURGERY N/A 05/14/2017   Procedure: EXTERNAL THROMBOSED HEMORRHOIDECTOMY;  Surgeon: Robert Bellow, MD;  Location: [facility] ORS;  Service: General;  Laterality: N/A;  . HIP ARTHROPLASTY Right 02/17/2017   Procedure:  ARTHROPLASTY BIPOLAR HIP (HEMIARTHROPLASTY);  Surgeon: Claud Kelp, MD;  Location: [facility] ORS;  Service: Orthopedics;  Laterality: Right;  . LEFT HEART CATH AND CORONARY ANGIOGRAPHY N/A 08/31/2016   Procedure: Left Heart Cath and Coronary Angiography;  Surgeon: Wellington Hampshire, MD;  Location: [place] CV LAB;  Service: Cardiovascular;  Laterality: N/A;  . ORIF PERIPROSTHETIC FRACTURE Right 05/27/2017  . ORIF PERIPROSTHETIC FRACTURE Right 05/27/2017   Procedure: OPEN REDUCTION INTERNAL FIXATION (ORIF) PERIPROSTHETIC FRACTURE;  Surgeon: Mcarthur Rossetti, MD;  Location: [place];  Service: Orthopedics;  Laterality: Right;  . TOTAL HIP ARTHROPLASTY Right   . Theadora Rama prolapse     Social History   Occupational History  . Not on file  Tobacco Use  . Smoking status: Never Smoker  . Smokeless tobacco: Never Used  Substance and Sexual Activity  . Alcohol use: No    Frequency: Never  . Drug use: No  . Sexual activity: Never

## 2017-11-04 ENCOUNTER — Other Ambulatory Visit: Payer: Self-pay | Admitting: Internal Medicine

## 2017-11-04 MED ORDER — CIPROFLOXACIN HCL 250 MG PO TABS: 250.0000 mg | ORAL_TABLET | Freq: Two times a day (BID) | ORAL | 0 refills | Status: DC

## 2017-11-05 ENCOUNTER — Telehealth: Payer: Self-pay | Admitting: *Deleted

## 2017-11-05 ENCOUNTER — Other Ambulatory Visit: Payer: Self-pay | Admitting: Internal Medicine

## 2017-11-05 DIAGNOSIS — R350 Frequency of micturition: Secondary | ICD-10-CM | POA: Diagnosis not present

## 2017-11-05 LAB — URINALYSIS, ROUTINE W REFLEX MICROSCOPIC
Bilirubin Urine: NEGATIVE
HGB URINE DIPSTICK: NEGATIVE
Ketones, ur: NEGATIVE
Leukocytes, UA: NEGATIVE
NITRITE: NEGATIVE
RBC / HPF: NONE SEEN (ref 0–?)
Specific Gravity, Urine: 1.01 (ref 1.000–1.030)
Total Protein, Urine: NEGATIVE
Urine Glucose: NEGATIVE
Urobilinogen, UA: 0.2 (ref 0.0–1.0)
pH: 5.5 (ref 5.0–8.0)

## 2017-11-05 NOTE — Addendum Note (Signed)
Addended by: Leeanne Rio on: 11/05/2017 12:57 PM   Modules accepted: Orders

## 2017-11-05 NOTE — Telephone Encounter (Signed)
Caregiver has already came and picked up specimen cup and returned it.

## 2017-11-05 NOTE — Telephone Encounter (Signed)
Copied from [place] [phone]. Topic: Inquiry >> Nov 05, 2017  8:38 AM Conception Chancy, NT wrote: Reason for CRM: patient daughter is requesting to come and pick a specimen cup up to determine if she still has a UTI. States she has done this previously and would like a call once the cup is placed up front for pick up. [phone]

## 2017-11-05 NOTE — Progress Notes (Signed)
ua and culture ordered for symptoms of frequency,  Patient treated last for E coli UTI on June 21.  Has chronic diarrhea which is her risk factor.

## 2017-11-06 ENCOUNTER — Encounter: Payer: Self-pay | Admitting: Nurse Practitioner

## 2017-11-06 ENCOUNTER — Ambulatory Visit (INDEPENDENT_AMBULATORY_CARE_PROVIDER_SITE_OTHER): Payer: Medicare Other | Admitting: Nurse Practitioner

## 2017-11-06 ENCOUNTER — Other Ambulatory Visit: Payer: Self-pay | Admitting: Internal Medicine

## 2017-11-06 VITALS — BP 180/72 | HR 66 | Ht 61.0 in | Wt 136.0 lb

## 2017-11-06 DIAGNOSIS — I10 Essential (primary) hypertension: Secondary | ICD-10-CM

## 2017-11-06 DIAGNOSIS — I350 Nonrheumatic aortic (valve) stenosis: Secondary | ICD-10-CM | POA: Diagnosis not present

## 2017-11-06 DIAGNOSIS — Z79899 Other long term (current) drug therapy: Secondary | ICD-10-CM | POA: Diagnosis not present

## 2017-11-06 DIAGNOSIS — I2510 Atherosclerotic heart disease of native coronary artery without angina pectoris: Secondary | ICD-10-CM

## 2017-11-06 LAB — URINE CULTURE
MICRO NUMBER:: 90805084
SPECIMEN QUALITY:: ADEQUATE

## 2017-11-06 MED ORDER — HYDROCHLOROTHIAZIDE 12.5 MG PO CAPS: 12.5000 mg | ORAL_CAPSULE | Freq: Every day | ORAL | 6 refills | Status: DC

## 2017-11-06 MED ORDER — SERTRALINE HCL 50 MG PO TABS: 75.0000 mg | ORAL_TABLET | Freq: Every day | ORAL | 1 refills | Status: DC

## 2017-11-06 NOTE — Patient Instructions (Addendum)
Medication Instructions: - Your physician has recommended you make the following change in your medication:   1) START hydrochlorothiazide (HCTZ) 12.5 mg- take 1 tablet by mouth once daily  Labwork: - Your physician recommends that you return for lab work in: 1 week- BMP  Procedures/Testing: - none ordered  Follow-Up: - Your physician recommends that you schedule a follow-up appointment in: 1 month with Dr. Fletcher Anon Ignacia Bayley, NP   Any Additional Special Instructions Will Be Listed Below (If Applicable).     If you need a refill on your cardiac medications before your next appointment, please call your pharmacy.

## 2017-11-06 NOTE — Progress Notes (Signed)
Office Visit    Patient Name: Maureen Morales Date of Encounter: 11/06/2017  Primary Care Provider:  [name], MD Primary Cardiologist:  [name], MD  Chief Complaint    84 year old female with a history of moderate aortic stenosis, difficult to control hypertension, diabetes, hyperlipidemia, prior non-STEMI, and nonobstructive CAD, who presents for follow-up related to blood pressure management.  Past Medical History    Past Medical History:  Diagnosis Date  . Aortic stenosis    a. 08/2016 Echo: Hyperdynamic LV fxn, mild AS (mean grad 45mmHg), mild MR, mild PAH; b. 05/2017 Echo: Hyperdynamic LV fxn, mod AS (mean grad 82mmHg, valve area 1.84). PASP 117mmHg.  . Asthma without status asthmaticus    unspecified  . Colitis    unspecified  . Complication of anesthesia    sensitive to anesthesia  . Depression   . Diabetes mellitus type 2, uncomplicated ([place])   . Essential hypertension   . GERD (gastroesophageal reflux disease)   . 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 Hyperlipidemia    unspecified  . Hypertension    a. 08/2017 Renal U/S: No RAS.  [name] Myocardial infarction ([place]) 2018  . Non-obstructive CAD (coronary artery disease)    a. 08/2016 NSTEMI/Cath: mild to mod nonobs dzs including 60% mRCA stenosis-->Med rx.  . Peripheral vascular disease ([place])   . Positive H. pylori test   . Viral gastroenteritis due to Norwalk-like agent Nov 2012   Past Surgical History:  Procedure Laterality Date  . ABDOMINAL HYSTERECTOMY    . BLADDER SURGERY    . CARDIAC CATHETERIZATION    . CHOLECYSTECTOMY    . COLONOSCOPY     06/16/1987, 02/20/1995, 05/04/1999, 02/14/2005, 05/17/2007  . ESOPHAGOGASTRODUODENOSCOPY     05/11/1987, 06/13/1995, 10/09/1995, 05/04/1999, 01/16/2002  . FLEXIBLE SIGMOIDOSCOPY N/A 05/02/2017   Procedure: FLEXIBLE SIGMOIDOSCOPY;  Surgeon: Manya Silvas, MD;  Location[facility] ENDOSCOPY;  Service: Endoscopy;  Laterality: N/A;  . HEMORRHOID SURGERY N/A  05/14/2017   Procedure: EXTERNAL THROMBOSED HEMORRHOIDECTOMY;  Surgeon: Robert Bellow, MD;  Location: [facility] ORS;  Service: General;  Laterality: N/A;  . HIP ARTHROPLASTY Right 02/17/2017   Procedure: ARTHROPLASTY BIPOLAR HIP (HEMIARTHROPLASTY);  Surgeon: Claud Kelp, MD;  Location: [facility] ORS;  Service: Orthopedics;  Laterality: Right;  . LEFT HEART CATH AND CORONARY ANGIOGRAPHY N/A 08/31/2016   Procedure: Left Heart Cath and Coronary Angiography;  Surgeon: Wellington Hampshire, MD;  Location: [place] CV LAB;  Service: Cardiovascular;  Laterality: N/A;  . ORIF PERIPROSTHETIC FRACTURE Right 05/27/2017  . ORIF PERIPROSTHETIC FRACTURE Right 05/27/2017   Procedure: OPEN REDUCTION INTERNAL FIXATION (ORIF) PERIPROSTHETIC FRACTURE;  Surgeon: Mcarthur Rossetti, MD;  Location: [place];  Service: Orthopedics;  Laterality: Right;  . TOTAL HIP ARTHROPLASTY Right   . uterian prolapse      Allergies  Allergies  Allergen Reactions  . Asa [Aspirin] Anaphylaxis  . Aspirin Swelling  . Atropine Other (See Comments)    Reaction: unknown  . Belladonna Alkaloids Other (See Comments)    Reaction: unknown  . Codeine Other (See Comments)    Reaction: unknown  . Darvon Other (See Comments)    Reaction: unknown  . Darvon [Propoxyphene] Nausea And Vomiting  . Demerol [Meperidine] Nausea And Vomiting  . Ferrous Sulfate Diarrhea  . Meperidine And Related   . Methocarbamol Other (See Comments)    Reaction: unknown  . Penicillins Other (See Comments)    Reaction: unknown Has patient had a PCN  reaction causing immediate rash, facial/tongue/throat swelling, SOB or lightheadedness with hypotension: Unknown Has patient had a PCN reaction causing severe rash involving mucus membranes or skin necrosis: Unknown Has patient had a PCN reaction that required hospitalization: Unknown Has patient had a PCN reaction occurring within the last 10 years: Unknown If all of the above answers are "NO", then may proceed  with Cephalosporin use.   [name] Penicillins Other (See Comments)    Pt and family unsure of details  . Sulfa Antibiotics Other (See Comments)    Reaction: unknown  . Iron Other (See Comments)    High dose prescription gives her diarrhea    History of Present Illness    84 year old female with the above past medical history including moderate aortic stenosis, nonobstructive CAD, hypertension, hyperlipidemia, diabetes.  She suffered a non-ST segment elevation myocardial infarction in May 2018 in the setting of uncontrolled hypertension.  Echo at that time showed normal LV function with mild aortic stenosis.  Catheterization revealed mild to moderate nonobstructive CAD including a 60% mid right coronary artery stenosis.  She was medically managed.  Subsequent renal artery duplex showed no evidence of renal artery stenosis.  In October 2018 and again in January 2019, she suffered right hip fractures requiring surgical repair.  Most recent echocardiogram January 2019 showed normal LV function with moderate aortic stenosis and a mean gradient of 29 mmHg and a valve area of 1.84.  Pulmonary artery systolic pressure was severely increased at 100 mmHg.  She was recently seen in early June by Dr. Fletcher Anon for ongoing hypertension.  He was previously noted that amlodipine and hydrochlorothiazide were discontinued following hip surgery in January and low blood pressures.  Amlodipine caused leg swelling.  She was also having some dizziness and diarrhea with hydralazine therapy.  At that visit, hydrochlorothiazide was added to her losartan and shortly thereafter, metoprolol was switched over to carvedilol.  On June 13, she was getting ready to go to dinner and had markedly elevated blood pressure.  She was on an empty stomach.  She took all of her medications including carvedilol, for the first time.  She was very weak following this and when she got to the restaurant, she became confused and weaker.  She was taken to the  emergency department where she was hemodynamically stable.  It was felt that symptoms were secondary to blood pressure medication changes and after communication with her primary care provider, losartan-HCTZ and hydralazine were discontinued and she was advised to take carvedilol 6.25 mg twice daily (prior dose was 12.5).  Since then, she has had no recurrence of presyncope or weakness.  Blood pressures at home are typically in the 150s though she has had a few days where pressures were in the 130s or 140s.  She has not had any chest pain, dyspnea, palpitations, PND, orthopnea, or early satiety.  Since being off of hydrochlorothiazide, she has noted recurrence of ankle swelling which is somewhat bothersome.  Home Medications    Prior to Admission medications   Medication Sig Start Date End Date Taking? Authorizing Provider  atorvastatin (LIPITOR) 40 MG tablet TAKE ONE TABLET BY MOUTH EVERY DAY 09/27/17  Yes [name], MD  carvedilol (COREG) 6.25 MG tablet Take 6.25 mg by mouth 2 (two) times daily with a meal.   Yes [provider]  clindamycin (CLEOCIN) 300 MG capsule 2 capsules one hour before dental procedure 06/18/17  Yes [name], MD  FLORA-Q [facility]) CAPS capsule Take 1 capsule by  mouth daily.   Yes [provider]  glipiZIDE (GLUCOTROL) 5 MG tablet Take 0.5 tablets (2.5 mg total) by mouth daily before breakfast. Patient taking differently: Take 2.5 mg by mouth every evening.  05/30/17  Yes [name], MD  glucose blood (BAYER CONTOUR TEST) test strip Use as instructed 05/30/17  Yes [name], MD  isosorbide mononitrate (IMDUR) 60 MG 24 hr tablet Take 1 tablet (60 mg total) by mouth daily. 09/07/17 12/06/17 Yes Wellington Hampshire, MD  loratadine (CLARITIN) 10 MG tablet Take 10 mg daily by mouth.    Yes [provider]  nitroGLYCERIN (NITROSTAT) 0.4 MG SL tablet Place 1 tablet (0.4 mg total) under the tongue every 5 (five) minutes as needed for chest  pain. 09/02/16  Yes Gouru, Illene Silver, MD  sertraline (ZOLOFT) 50 MG tablet Take 1.5 tablets (75 mg total) by mouth daily. 11/06/17  Yes [name], MD  vitamin C (ASCORBIC ACID) 500 MG tablet Take 1 tablet (500 mg total) by mouth 2 (two) times daily. 05/04/17  Yes [name], MD  hydrochlorothiazide (MICROZIDE) 12.5 MG capsule Take 1 capsule (12.5 mg total) by mouth daily. 11/06/17 02/04/18  Theora Gianotti, NP    Review of Systems    Mild ankle edema.  No recurrent presyncope or weakness.  She denies chest pain, palpitations, dyspnea, PND, orthopnea, or early satiety.  All other systems reviewed and are otherwise negative except as noted above.  Physical Exam    VS:  BP (!) 180/72 (BP Location: Left Arm, Patient Position: Sitting, Cuff Size: Normal)   Pulse 66   Ht 5\' 1"  (1.549 m)   Wt 136 lb (61.7 kg)   BMI 25.70 kg/m  , BMI Body mass index is 25.7 kg/m. GEN: Well nourished, well developed, in no acute distress.  HEENT: normal.  Neck: Supple, no JVD, carotid bruits, or masses. Cardiac: RRR, 3/6 systolic ejection murmur at the upper sternal borders, no rubs, or gallops. No clubbing, cyanosis, 1+ bilateral ankle edema.  Radials/DP/PT 2+ and equal bilaterally.  Respiratory:  Respirations regular and unlabored, clear to auscultation bilaterally. GI: Soft, nontender, nondistended, BS + x 4. MS: no deformity or atrophy. Skin: warm and dry, no rash. Neuro:  Strength and sensation are intact. Psych: Normal affect.  Accessory Clinical Findings    Lab Results  Component Value Date   CREATININE 0.98 10/11/2017   BUN 38 (H) 10/11/2017   NA 137 10/11/2017   K 4.1 10/11/2017   CL 107 10/11/2017   CO2 19 (L) 10/11/2017    Assessment & Plan    1.  Nonobstructive coronary artery disease: She does not experience chest pain and is tolerating beta-blocker and nitrate therapy well.  Continue statin.  2.  Essential hypertension: Blood pressure elevated today at 180/72.  She has been  trending mostly in the 150s at home which overall is improved for her.  She appears to be very sensitive to medication changes, becoming very weak on June 13 and requiring ER evaluation.  She is currently only taking carvedilol 6.25 mg twice daily as losartan HCTZ and hydralazine were recently discontinued.  She has not had any recurrent weakness.  We discussed options for reintroduction of some amount of antihypertensive therapy in addition to beta-blocker, and given mild lower extremity swelling, she and her daughter have agreed that a low dose of hydrochlorothiazide may be optimal.  I will add back HCTZ 12.5 mg daily.  Plan to follow-up a basic metabolic panel  in 1 week.  Of note, patient's daughter points out that she was having some nocturia when on HCTZ in the past but that it was ultimately determined that this was secondary to a urinary tract infection.  If for some reason she is not able to tolerate HCTZ, we can consider reinstitution of a low-dose of losartan instead.  In the interim, they will continue to check her blood pressure daily and will contact us with blood pressure figures in about a week.  3.  Moderate aortic stenosis: Plan for follow-up echo in 6 months.  4.  Hyperlipidemia: LDL 68.  Continue statin.  5.  Pulmonary hypertension: Echo in January showed significant pulmonary arterial pressure elevation.  This was felt to be secondary to elevated systemic pressures.  Plan to follow-up echo in 6 months.  6.  Disposition: Patient to follow blood pressures at home and call us within a week.  Follow-up basic metabolic panel in 1 week.  Follow-up with Dr. Fletcher Anon in 1 month or sooner if necessary.  Murray Hodgkins, NP 11/06/2017, 4:56 PM

## 2017-11-08 ENCOUNTER — Emergency Department: Payer: Medicare Other

## 2017-11-08 ENCOUNTER — Emergency Department
Admission: EM | Admit: 2017-11-08 | Discharge: 2017-11-08 | Disposition: A | Discharge status: Home / Self Care | Payer: Medicare Other | Attending: Emergency Medicine | Admitting: Emergency Medicine

## 2017-11-08 ENCOUNTER — Other Ambulatory Visit: Payer: Self-pay

## 2017-11-08 ENCOUNTER — Encounter: Payer: Self-pay | Admitting: Emergency Medicine

## 2017-11-08 DIAGNOSIS — E1121 Type 2 diabetes mellitus with diabetic nephropathy: Secondary | ICD-10-CM | POA: Insufficient documentation

## 2017-11-08 DIAGNOSIS — I110 Hypertensive heart disease with heart failure: Secondary | ICD-10-CM | POA: Diagnosis not present

## 2017-11-08 DIAGNOSIS — M6281 Muscle weakness (generalized): Secondary | ICD-10-CM | POA: Diagnosis not present

## 2017-11-08 DIAGNOSIS — R55 Syncope and collapse: Secondary | ICD-10-CM | POA: Diagnosis not present

## 2017-11-08 DIAGNOSIS — J45998 Other asthma: Secondary | ICD-10-CM | POA: Insufficient documentation

## 2017-11-08 DIAGNOSIS — Z79899 Other long term (current) drug therapy: Secondary | ICD-10-CM | POA: Diagnosis not present

## 2017-11-08 DIAGNOSIS — I10 Essential (primary) hypertension: Secondary | ICD-10-CM | POA: Insufficient documentation

## 2017-11-08 DIAGNOSIS — R531 Weakness: Secondary | ICD-10-CM | POA: Diagnosis not present

## 2017-11-08 DIAGNOSIS — Z7984 Long term (current) use of oral hypoglycemic drugs: Secondary | ICD-10-CM | POA: Diagnosis not present

## 2017-11-08 DIAGNOSIS — I5032 Chronic diastolic (congestive) heart failure: Secondary | ICD-10-CM | POA: Insufficient documentation

## 2017-11-08 DIAGNOSIS — R112 Nausea with vomiting, unspecified: Secondary | ICD-10-CM | POA: Diagnosis present

## 2017-11-08 LAB — URINALYSIS, COMPLETE (UACMP) WITH MICROSCOPIC
BILIRUBIN URINE: NEGATIVE
Bacteria, UA: NONE SEEN
GLUCOSE, UA: NEGATIVE mg/dL
HGB URINE DIPSTICK: NEGATIVE
KETONES UR: NEGATIVE mg/dL
Leukocytes, UA: NEGATIVE
NITRITE: NEGATIVE
Protein, ur: 30 mg/dL — AB
Specific Gravity, Urine: 1.012 (ref 1.005–1.030)
Squamous Epithelial / LPF: NONE SEEN (ref 0–5)
pH: 5 (ref 5.0–8.0)

## 2017-11-08 LAB — BASIC METABOLIC PANEL
ANION GAP: 9 (ref 5–15)
BUN: 36 mg/dL — ABNORMAL HIGH (ref 8–23)
CALCIUM: 9.2 mg/dL (ref 8.9–10.3)
CO2: 23 mmol/L (ref 22–32)
CREATININE: 0.93 mg/dL (ref 0.44–1.00)
Chloride: 106 mmol/L (ref 98–111)
GFR, EST AFRICAN AMERICAN: 60 mL/min — AB (ref >60)
GFR, EST NON AFRICAN AMERICAN: 52 mL/min — AB (ref >60)
GLUCOSE: 167 mg/dL — AB (ref 70–99)
Potassium: 4.1 mmol/L (ref 3.5–5.1)
Sodium: 138 mmol/L (ref 135–145)

## 2017-11-08 LAB — CBC
HCT: 33.3 % — ABNORMAL LOW (ref 35.0–47.0)
HEMOGLOBIN: 11.3 g/dL — AB (ref 12.0–16.0)
MCH: 28.6 pg (ref 26.0–34.0)
MCHC: 34 g/dL (ref 32.0–36.0)
MCV: 84.3 fL (ref 80.0–100.0)
PLATELETS: 198 10*3/uL (ref 150–440)
RBC: 3.95 MIL/uL (ref 3.80–5.20)
RDW: 13.8 % (ref 11.5–14.5)
WBC: 7.9 10*3/uL (ref 3.6–11.0)

## 2017-11-08 LAB — TROPONIN I: Troponin I: <0.03 ng/mL (ref <0.03)

## 2017-11-08 MED ORDER — CLONIDINE HCL 0.1 MG PO TABS
0.1000 mg | ORAL_TABLET | Freq: Once | ORAL | Status: AC
  Administered 2017-11-08: 0.1 mg via ORAL

## 2017-11-08 MED ORDER — HYDROCHLOROTHIAZIDE 12.5 MG PO CAPS
12.5000 mg | ORAL_CAPSULE | Freq: Every day | ORAL | Status: DC
  Administered 2017-11-08: 12.5 mg via ORAL

## 2017-11-08 MED ORDER — HYDRALAZINE HCL 50 MG PO TABS
25.0000 mg | ORAL_TABLET | Freq: Once | ORAL | Status: DC
  Filled 2017-11-08: qty 1

## 2017-11-08 MED ORDER — HYDROCHLOROTHIAZIDE 12.5 MG PO CAPS
ORAL_CAPSULE | ORAL | Status: AC
  Administered 2017-11-08: 12.5 mg via ORAL
  Filled 2017-11-08: qty 1

## 2017-11-08 MED ORDER — ACETAMINOPHEN 325 MG PO TABS
650.0000 mg | ORAL_TABLET | Freq: Once | ORAL | Status: AC
  Administered 2017-11-08: 650 mg via ORAL

## 2017-11-08 MED ORDER — ONDANSETRON HCL 4 MG/2ML IJ SOLN
4.0000 mg | Freq: Once | INTRAMUSCULAR | Status: DC
  Filled 2017-11-08: qty 2

## 2017-11-08 MED ORDER — CLONIDINE HCL 0.1 MG PO TABS
ORAL_TABLET | ORAL | Status: AC
  Administered 2017-11-08: 0.1 mg via ORAL
  Filled 2017-11-08: qty 1

## 2017-11-08 MED ORDER — HYDRALAZINE HCL 25 MG PO TABS: 25.0000 mg | ORAL_TABLET | Freq: Three times a day (TID) | ORAL | 0 refills | Status: DC

## 2017-11-08 MED ORDER — HYDRALAZINE HCL 20 MG/ML IJ SOLN
5.0000 mg | Freq: Once | INTRAMUSCULAR | Status: DC
  Filled 2017-11-08: qty 1

## 2017-11-08 MED ORDER — SODIUM CHLORIDE 0.9 % IV BOLUS
500.0000 mL | Freq: Once | INTRAVENOUS | Status: AC
  Administered 2017-11-08: 500 mL via INTRAVENOUS

## 2017-11-08 MED ORDER — ACETAMINOPHEN 325 MG PO TABS
ORAL_TABLET | ORAL | Status: AC
  Filled 2017-11-08: qty 2

## 2017-11-08 NOTE — ED Provider Notes (Signed)
[facility] Emergency Department Provider Note  ____________________________________________  Time seen: Approximately 3:12 PM  I have reviewed the triage vital signs and the nursing notes.   HISTORY  Chief Complaint Nausea; Emesis; and Weakness    HPI Maureen Morales is a 84 y.o. female a history of diabetes hypertension MI and aortic stenosis who comes to the ED today after syncope at home.  She was being taken to the bathroom in a wheelchair when she suddenly passed out in the wheelchair.  She did not fall.  She quickly regained consciousness after a few seconds.  She was helped into a bed after which she  return to normal.  No preceding symptoms.  No specific symptoms afterward.  Feels that her usual self.  Has chronic right leg pain which is unchanged from baseline.  She had previously been on several antihypertensives.  However, after an episode where she had low blood pressure and passed out in a restaurant, many of her blood pressure medicines were abruptly discontinued.  This is allowed her blood pressure to run high, 222-979 systolic.  She saw cardiology clinic 2 days ago who restarted her on low-dose HCTZ.  She has been taking this but blood pressures remained elevated.  It was as high as 200/100 at home immediately after syncope.  No paresthesias weakness vision changes or headache   Past Medical History:  Diagnosis Date  . Aortic stenosis    a. 08/2016 Echo: Hyperdynamic LV fxn, mild AS (mean grad 92mmHg), mild MR, mild PAH; b. 05/2017 Echo: Hyperdynamic LV fxn, mod AS (mean grad 78mmHg, valve area 1.84). PASP 166mmHg.  . Asthma without status asthmaticus    unspecified  . Colitis    unspecified  . Complication of anesthesia    sensitive to anesthesia  . Depression   . Diabetes mellitus type 2, uncomplicated ([place])   . Essential hypertension   . GERD (gastroesophageal reflux disease)   . 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 Hyperlipidemia     unspecified  . Hypertension    a. 08/2017 Renal U/S: No RAS.  [name] Myocardial infarction ([place]) 2018  . Non-obstructive CAD (coronary artery disease)    a. 08/2016 NSTEMI/Cath: mild to mod nonobs dzs including 60% mRCA stenosis-->Med rx.  . Peripheral vascular disease ([place])   . Positive H. pylori test   . Viral gastroenteritis due to [place]-like agent Nov 2012     Patient Active Problem List   Diagnosis Date Noted  . Severe tricuspid regurgitation 09/03/2017  . Mitral and aortic valve regurgitation 09/03/2017  . Diastolic dysfunction without heart failure 09/03/2017  . Hypertensive crisis   . Labile blood pressure   . Hypokalemia   . Hypoalbuminemia due to protein-calorie malnutrition ([place])   . Intermittent asthma without complication   . Stage 3 chronic kidney disease ([place])   . Chronic diastolic congestive heart failure ([place])   . Periprosthetic fracture around internal prosthetic right hip joint ([place]) 05/30/2017  . Decubitus ulcer of right heel, stage 3 ([place])   . Anal fistula   . History of right hip hemiarthroplasty   . Benign essential HTN   . Acute blood loss anemia   . Diabetes mellitus type 2 in nonobese (H[facility])   . AKI (acute kidney injury) ([place])   . Post-operative pain   . Fall 05/26/2017  . Closed right hip fracture ([place]) 05/26/2017  . Normocytic anemia 05/26/2017  . Abdominal distention 05/26/2017  . CAD (coronary artery  disease) 05/26/2017  . Depression   . HLD (hyperlipidemia)   . Essential hypertension   . 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 Closed displaced fracture of right femoral neck ([place]) 02/16/2017  . Abnormal CT scan, colon   . Generalized anxiety disorder 09/07/2016  . Unsteady gait 09/07/2016  . NSTEMI (non-ST elevated myocardial infarction) ([place]) 08/30/2016  . Pain in joint, ankle and foot 06/24/2014  . S/P laparoscopic cholecystectomy 12/15/2013  . Preoperative general physical examination  12/15/2013  . Encounter for preventive health examination 11/13/2013  . Cervical spine disease 01/27/2013  . Anemia, iron deficiency 01/21/2013  . Right medial knee pain 01/09/2013  . Back pain, thoracic 08/27/2012  . Moderate aortic stenosis by prior echocardiogram 05/06/2012  . Asthma 07/04/2011  . Hemorrhoids, internal 04/05/2011  . Nummular eczema 01/16/2011  . Essential hypertension   . Type II diabetes mellitus with nephropathy ([place])   . Hyperlipidemia      Past Surgical History:  Procedure Laterality Date  . ABDOMINAL HYSTERECTOMY    . BLADDER SURGERY    . CARDIAC CATHETERIZATION    . CHOLECYSTECTOMY    . COLONOSCOPY     06/16/1987, 02/20/1995, 05/04/1999, 02/14/2005, 05/17/2007  . ESOPHAGOGASTRODUODENOSCOPY     05/11/1987, 06/13/1995, 10/09/1995, 05/04/1999, 01/16/2002  . FLEXIBLE SIGMOIDOSCOPY N/A 05/02/2017   Procedure: FLEXIBLE SIGMOIDOSCOPY;  Surgeon: Manya Silvas, MD;  Location: [facility] ENDOSCOPY;  Service: Endoscopy;  Laterality: N/A;  . HEMORRHOID SURGERY N/A 05/14/2017   Procedure: EXTERNAL THROMBOSED HEMORRHOIDECTOMY;  Surgeon: Robert Bellow, MD;  Location: [facility] ORS;  Service: General;  Laterality: N/A;  . HIP ARTHROPLASTY Right 02/17/2017   Procedure: ARTHROPLASTY BIPOLAR HIP (HEMIARTHROPLASTY);  Surgeon: Claud Kelp, MD;  Location: [facility] ORS;  Service: Orthopedics;  Laterality: Right;  . LEFT HEART CATH AND CORONARY ANGIOGRAPHY N/A 08/31/2016   Procedure: Left Heart Cath and Coronary Angiography;  Surgeon: Wellington Hampshire, MD;  Location: [facility];  Service: Cardiovascular;  Laterality: N/A;  . ORIF PERIPROSTHETIC FRACTURE Right 05/27/2017  . ORIF PERIPROSTHETIC FRACTURE Right 05/27/2017   Procedure: OPEN REDUCTION INTERNAL FIXATION (ORIF) PERIPROSTHETIC FRACTURE;  Surgeon: Mcarthur Rossetti, MD;  Location: [place];  Service: Orthopedics;  Laterality: Right;  . TOTAL HIP ARTHROPLASTY Right   . uterian prolapse       Prior to Admission  medications   Medication Sig Start Date End Date Taking? Authorizing Provider  atorvastatin (LIPITOR) 40 MG tablet TAKE ONE TABLET BY MOUTH EVERY DAY 09/27/17  Yes [name], MD  carvedilol (COREG) 6.25 MG tablet Take 6.25 mg by mouth 2 (two) times daily with a meal.   Yes [provider]  FLORA-Q (FLORA-Q) CAPS capsule Take 1 capsule by mouth daily.   Yes [provider]  glipiZIDE (GLUCOTROL) 5 MG tablet Take 0.5 tablets (2.5 mg total) by mouth daily before breakfast. Patient taking differently: Take 2.5 mg by mouth every evening.  05/30/17  Yes [name], MD  hydrochlorothiazide (MICROZIDE) 12.5 MG capsule Take 1 capsule (12.5 mg total) by mouth daily. 11/06/17 02/04/18 Yes Theora Gianotti, NP  isosorbide mononitrate (IMDUR) 60 MG 24 hr tablet Take 1 tablet (60 mg total) by mouth daily. 09/07/17 12/06/17 Yes Wellington Hampshire, MD  loratadine (CLARITIN) 10 MG tablet Take 10 mg daily by mouth.    Yes [provider]  sertraline (ZOLOFT) 50 MG tablet Take 1.5 tablets (75 mg total) by mouth daily. 11/06/17  Yes [name], MD  vitamin C (ASCORBIC ACID) 500 MG tablet Take 1 tablet (500 mg total) by mouth 2 (two) times daily. 05/04/17  Yes [name], MD  clindamycin (CLEOCIN) 300 MG capsule 2 capsules one hour before dental procedure Patient not taking: Reported on 11/08/2017 06/18/17   [name], MD  glucose blood (BAYER CONTOUR TEST) test strip Use as instructed 05/30/17   [name], MD  hydrALAZINE (APRESOLINE) 25 MG tablet Take 1 tablet (25 mg total) by mouth 3 (three) times daily. 11/08/17 12/08/17  Carrie Mew, MD  nitroGLYCERIN (NITROSTAT) 0.4 MG SL tablet Place 1 tablet (0.4 mg total) under the tongue every 5 (five) minutes as needed for chest pain. 09/02/16   Nicholes Mango, MD     Allergies Asa [aspirin]; Aspirin; Atropine; Belladonna alkaloids; Codeine; Darvon; Darvon [propoxyphene]; Demerol [meperidine]; Ferrous sulfate;  Meperidine and related; Methocarbamol; Penicillins; Penicillins; Sulfa antibiotics; and Iron   Family History  Problem Relation Age of Onset  . Breast cancer Mother 35  . Colon cancer Father   . Stroke Sister   . Cancer Mother   . Breast cancer Sister 60  . Colon cancer Brother   . Colon cancer Brother     Social History Social History   Tobacco Use  . Smoking status: Never Smoker  . Smokeless tobacco: Never Used  Substance Use Topics  . Alcohol use: No    Frequency: Never  . Drug use: No    Review of Systems  Constitutional:   No fever or chills.  ENT:   No sore throat. No rhinorrhea. Cardiovascular:   No chest pain or syncope. Respiratory:   No dyspnea or cough. Gastrointestinal:   Negative for abdominal pain, vomiting and diarrhea.  Musculoskeletal:   Chronic right leg pain All other systems reviewed and are negative except as documented above in ROS and HPI.  ____________________________________________   PHYSICAL EXAM:  VITAL SIGNS: ED Triage Vitals  Enc Vitals Group     BP 11/08/17 1124 (!) 203/70     Pulse Rate 11/08/17 1124 67     Resp 11/08/17 1124 16     Temp 11/08/17 1124 97.8 F (36.6 C)     Temp Source 11/08/17 1124 Oral     SpO2 11/08/17 1124 95 %     Weight 11/08/17 1128 135 lb (61.2 kg)     Height 11/08/17 1128 5\' 1"  (1.549 m)     Head Circumference --      Peak Flow --      Pain Score 11/08/17 1127 0     Pain Loc --      Pain Edu? --      Excl. in [place]? --     Vital signs reviewed, nursing assessments reviewed.   Constitutional:   Alert and oriented. Non-toxic appearance. Eyes:   Conjunctivae are normal. EOMI. PERRL. ENT      Head:   Normocephalic and atraumatic.      Nose:   No congestion/rhinnorhea.       Mouth/Throat:   MMM, no pharyngeal erythema. No peritonsillar mass.       Neck:   No meningismus. Full ROM. Hematological/Lymphatic/Immunilogical:   No cervical lymphadenopathy. Cardiovascular:   RRR. Symmetric bilateral radial  and DP pulses.  No murmurs.  Respiratory:   Normal respiratory effort without tachypnea/retractions. Breath sounds are clear and equal bilaterally. No wheezes/rales/rhonchi. Gastrointestinal:   Soft and nontender. Non distended. There is no CVA tenderness.  No rebound, rigidity, or guarding. Musculoskeletal:   Normal  range of motion in all extremities. No joint effusions.  No lower extremity tenderness.  No edema. Neurologic:   Normal speech and language.  Motor grossly intact. No acute focal neurologic deficits are appreciated.  Skin:    Skin is warm, dry and intact. No rash noted.  No petechiae, purpura, or bullae.  ____________________________________________    LABS (pertinent positives/negatives) (all labs ordered are listed, but only abnormal results are displayed) Labs Reviewed  BASIC METABOLIC PANEL - Abnormal; Notable for the following components:      Result Value   Glucose, Bld 167 (*)    BUN 36 (*)    GFR calc non Af Amer 52 (*)    GFR calc Af Amer 60 (*)    All other components within normal limits  CBC - Abnormal; Notable for the following components:   Hemoglobin 11.3 (*)    HCT 33.3 (*)    All other components within normal limits  URINALYSIS, COMPLETE (UACMP) WITH MICROSCOPIC - Abnormal; Notable for the following components:   Color, Urine YELLOW (*)    APPearance CLEAR (*)    Protein, ur 30 (*)    All other components within normal limits  TROPONIN I  CBG MONITORING, ED   ____________________________________________   EKG  Interpreted by me Sinus rhythm rate of 65, normal axis intervals QRS ST segments and T waves  ____________________________________________    RADIOLOGY  Ct Head Wo Contrast  Result Date: 11/08/2017 CLINICAL DATA:  84 year old female with increased blood pressure today causing syncopal episode. Weakness. Initial encounter. EXAM: CT HEAD WITHOUT CONTRAST TECHNIQUE: Contiguous axial images were obtained from the base of the skull  through the vertex without intravenous contrast. COMPARISON:  10/11/2017 head CT. FINDINGS: Brain: No intracranial hemorrhage or CT evidence of large acute infarct. Chronic microvascular changes. Global atrophy. No intracranial mass lesion noted on this unenhanced exam. Vascular: Vascular calcifications. Skull: Negative Sinuses/Orbits: No acute orbital abnormality. Minimal mucosal thickening ethmoid sinus air cells otherwise visualized paranasal sinuses are clear. Other: Visualized mastoid air cells and middle ear cavities are clear. IMPRESSION: No acute intracranial abnormality. Chronic microvascular changes. Atrophy. Electronically Signed   By: Genia Del M.D.   On: 11/08/2017 12:19    ____________________________________________   PROCEDURES Procedures  ____________________________________________  DIFFERENTIAL DIAGNOSIS   Intracranial hemorrhage, uncontrolled hypertension, metabolic derangement  CLINICAL IMPRESSION / ASSESSMENT AND PLAN / ED COURSE  Pertinent labs & imaging results that were available during my care of the patient were reviewed by me and considered in my medical decision making (see chart for details).    Patient not in distress, presents with hypertension and syncope.  No prodromal symptoms or severe current symptoms.  Doubt ACS PE dissection stroke AAA or other vascular event.  EKG is unremarkable.  Labs unremarkable.  CT head obtained to evaluate for intracranial hemorrhage causing the syncope as a result of hypertension.  This was negative.  Patient's primary care doctor, Dr. Derrel Nip came to the ED.  She is very familiar with the patient's blood pressure management issues.  We reviewed the chart together, she feels that the best course of action would be to restart the hydralazine at a lower dose than before, 25 mg 3 times daily.  At the time the blood pressure was back up to 210/100, so is going to give a small dose of IV hydralazine and an oral tablet here in the ED,  however prior to being administered blood pressure has come back down to 160/80 so these are  being held for now.  Likely the extra HCTZ and clonidine that I gave in the ED is improving the blood pressure for now.  I will have the patient restart hydralazine tomorrow and continue following up closely with Dr. Derrel Nip.      ____________________________________________   FINAL CLINICAL IMPRESSION(S) / ED DIAGNOSES    Final diagnoses:  Syncope, unspecified syncope type  Hypertension, unspecified type     ED Discharge Orders        Ordered    hydrALAZINE (APRESOLINE) 25 MG tablet  3 times daily     11/08/17 1511      Portions of this note were generated with dragon dictation software. Dictation errors may occur despite best attempts at proofreading.    Carrie Mew, MD 11/08/17 1517

## 2017-11-08 NOTE — ED Notes (Signed)
Family reports that pt was actually in a WC after eating breakfast and became weak. She told family that she was just not feeling well. Family also reports that she did go see a MD Tuesday and they did change a B/P medication.

## 2017-11-08 NOTE — ED Triage Notes (Signed)
Pt to ED via [facility] with c/o weakness, N/V. EMS reports that family states that she ate breakfast went to walk to into anther part of house became stiff then legs gave out. Pt has had 2 episodes of N/V.

## 2017-11-08 NOTE — ED Notes (Signed)
Pt B/P became hypertensive. MD aware orders given. Gave pt cracker per request so it would not upset her stomach.

## 2017-11-08 NOTE — ED Notes (Signed)
Pt reports feeling better and requests to go home at this time. Family remains at side.

## 2017-11-08 NOTE — ED Notes (Signed)
Pt resting in room, family and friends at bedside. Pt denies any needs at this time. Call light within reach. Pt reports that she is not nauseated and would like to wait on taking Zofran. Will continue to monitor for changes.

## 2017-11-09 ENCOUNTER — Telehealth: Payer: Self-pay | Admitting: Cardiovascular Disease

## 2017-11-09 NOTE — Telephone Encounter (Signed)
I called and spoke with Tena. She states she was in [place] yesterday, but reports that the patient did have a syncopal episode and was in the ER for further evaluation.  Per [name], a similar episode had happened before when she was in a restaurant and several of her meds were stopped. She was seen in our office by Ignacia Bayley, NP on 7/9 and HCTZ 12.5 mg once daily was restarted.  Today, the patient has had BP readings of  176/82, 202/95, 198/98, 173/83, & 198/98. They had also called Dr. Lupita Dawn office and Dr. Derrel Nip recommended that the patient take a dose of losartan and recheck her BP in about 2 hours. The patient took losartan 100 mg around 3:40 pm. I have advised Tena to recheck the patient's blood pressure in the next 10 minutes or so. Tena states they may be seeing Dr. Derrel Nip tonight.  I have advised them to discuss further with Dr. Derrel Nip, but that I will also send to Dr. Fletcher Anon to review.  [name] is agreeable.

## 2017-11-09 NOTE — Telephone Encounter (Signed)
°  Pt c/o BP issue: STAT if pt c/o blurred vision, one-sided weakness or slurred speech  1. What are your last 5 BP readings? 198/98 now   2. Are you having any other symptoms (ex. Dizziness, headache, blurred vision, passed out)?  Headache passed out yesterday   3. What is your BP issue?  Went to ed on Thursday for syncope now with elevated bp and not sure what meds to take   Please call

## 2017-11-10 ENCOUNTER — Telehealth: Payer: Self-pay | Admitting: Internal Medicine

## 2017-11-10 DIAGNOSIS — I10 Essential (primary) hypertension: Secondary | ICD-10-CM

## 2017-11-10 MED ORDER — LOSARTAN POTASSIUM 100 MG PO TABS: 100.0000 mg | ORAL_TABLET | Freq: Every day | ORAL | 0 refills | Status: DC

## 2017-11-10 NOTE — Telephone Encounter (Signed)
I was present during the ER visit on Thursday July 11 .  The hydralazine was not given in ER because the clonidine lowered her BP significantly (and her systolic dropped from 161 to 120 upon standing.  Since discharge home she has had very elevated readings on carvedilol and hctz, so I advised her to restart losartan per cardiology rec's from last visit.

## 2017-11-10 NOTE — Assessment & Plan Note (Addendum)
I was present during the ER visit on Thursday July 11 for hypertensive urgency with syncopal event at home.  At time of Er evaluation patient 's anti hypertensive regimen included only carvedilol 6.25 mg bid. Imdur ,  and hctz 12.5 mg daily (per cardiology visit Tuesday July 9) .  The hydralazine was not given in ER because the one dose of clonidine lowered her BP significantly (and her systolic dropped from 244 to 120 upon standing, prior to discharge).  Since discharge home she has had very elevated readings on former regimen .  I have advised her to restart losartan per cardiology rec's from last visit.

## 2017-11-13 NOTE — Telephone Encounter (Signed)
Continue current dose of carvedilol. Resume losartan 100 mg daily and continue hydrochlorothiazide 12.5 mg once daily. Use hydralazine 25 mg 3 times daily as needed if systolic blood pressure exceeds 160 mmHg.

## 2017-11-14 ENCOUNTER — Encounter: Payer: Self-pay | Admitting: *Deleted

## 2017-11-14 MED ORDER — HYDRALAZINE HCL 25 MG PO TABS: 25.0000 mg | ORAL_TABLET | Freq: Three times a day (TID) | ORAL | 3 refills | Status: DC | PRN

## 2017-11-14 NOTE — Telephone Encounter (Signed)
Spoke with patients daughter per release form and reviewed recommendations by provider. She states that Dr. Derrel Nip had also recommended that patient take the losartan at lunch time since it was making her feel weak and use the hydralazine if BP spikes up. Reviewed that she should continue the carvedilol dosage, resume losartan 100 mg daily, continue hydrochlorothiazide 12.5 mg once daily and use hydralazine 25 mg three times daily as needed for systolic blood pressure greater than 160. She verbalized understanding of all instructions, agreement with plan, and has no further questions at this time. She did request that I fax her these instructions to [phone].

## 2017-11-15 ENCOUNTER — Other Ambulatory Visit
Admission: RE | Admit: 2017-11-15 | Discharge: 2017-11-15 | Disposition: A | Discharge status: Home / Self Care | Payer: Medicare Other | Source: Ambulatory Visit | Attending: Nurse Practitioner | Admitting: Nurse Practitioner

## 2017-11-15 DIAGNOSIS — Z79899 Other long term (current) drug therapy: Secondary | ICD-10-CM | POA: Insufficient documentation

## 2017-11-15 DIAGNOSIS — I10 Essential (primary) hypertension: Secondary | ICD-10-CM | POA: Insufficient documentation

## 2017-11-15 LAB — BASIC METABOLIC PANEL
Anion gap: 7 (ref 5–15)
BUN: 36 mg/dL — AB (ref 8–23)
CHLORIDE: 106 mmol/L (ref 98–111)
CO2: 25 mmol/L (ref 22–32)
CREATININE: 0.93 mg/dL (ref 0.44–1.00)
Calcium: 9.2 mg/dL (ref 8.9–10.3)
GFR calc non Af Amer: 52 mL/min — ABNORMAL LOW (ref >60)
GFR, EST AFRICAN AMERICAN: 60 mL/min — AB (ref >60)
Glucose, Bld: 277 mg/dL — ABNORMAL HIGH (ref 70–99)
POTASSIUM: 3.9 mmol/L (ref 3.5–5.1)
SODIUM: 138 mmol/L (ref 135–145)

## 2017-11-22 DIAGNOSIS — L821 Other seborrheic keratosis: Secondary | ICD-10-CM | POA: Diagnosis not present

## 2017-11-22 DIAGNOSIS — D2271 Melanocytic nevi of right lower limb, including hip: Secondary | ICD-10-CM | POA: Diagnosis not present

## 2017-11-22 DIAGNOSIS — D2261 Melanocytic nevi of right upper limb, including shoulder: Secondary | ICD-10-CM | POA: Diagnosis not present

## 2017-11-22 DIAGNOSIS — D225 Melanocytic nevi of trunk: Secondary | ICD-10-CM | POA: Diagnosis not present

## 2017-11-22 DIAGNOSIS — D2272 Melanocytic nevi of left lower limb, including hip: Secondary | ICD-10-CM | POA: Diagnosis not present

## 2017-11-22 DIAGNOSIS — D2262 Melanocytic nevi of left upper limb, including shoulder: Secondary | ICD-10-CM | POA: Diagnosis not present

## 2017-11-26 ENCOUNTER — Other Ambulatory Visit: Payer: Self-pay | Admitting: Internal Medicine

## 2017-11-26 DIAGNOSIS — Z96641 Presence of right artificial hip joint: Secondary | ICD-10-CM

## 2017-11-26 DIAGNOSIS — Z780 Asymptomatic menopausal state: Secondary | ICD-10-CM

## 2017-12-05 ENCOUNTER — Other Ambulatory Visit: Payer: Self-pay

## 2017-12-05 ENCOUNTER — Encounter: Payer: Self-pay | Admitting: Physical Medicine & Rehabilitation

## 2017-12-05 ENCOUNTER — Encounter: Payer: Medicare Other | Attending: Physical Medicine & Rehabilitation | Admitting: Physical Medicine & Rehabilitation

## 2017-12-05 VITALS — BP 180/79 | HR 61 | Ht 61.0 in | Wt 134.8 lb

## 2017-12-05 DIAGNOSIS — H9192 Unspecified hearing loss, left ear: Secondary | ICD-10-CM | POA: Insufficient documentation

## 2017-12-05 DIAGNOSIS — I10 Essential (primary) hypertension: Secondary | ICD-10-CM | POA: Insufficient documentation

## 2017-12-05 DIAGNOSIS — M9701XS Periprosthetic fracture around internal prosthetic right hip joint, sequela: Secondary | ICD-10-CM | POA: Diagnosis not present

## 2017-12-05 DIAGNOSIS — X58XXXA Exposure to other specified factors, initial encounter: Secondary | ICD-10-CM | POA: Diagnosis not present

## 2017-12-05 DIAGNOSIS — E1151 Type 2 diabetes mellitus with diabetic peripheral angiopathy without gangrene: Secondary | ICD-10-CM | POA: Insufficient documentation

## 2017-12-05 DIAGNOSIS — F329 Major depressive disorder, single episode, unspecified: Secondary | ICD-10-CM | POA: Insufficient documentation

## 2017-12-05 DIAGNOSIS — L97419 Non-pressure chronic ulcer of right heel and midfoot with unspecified severity: Secondary | ICD-10-CM | POA: Insufficient documentation

## 2017-12-05 DIAGNOSIS — J45909 Unspecified asthma, uncomplicated: Secondary | ICD-10-CM | POA: Diagnosis not present

## 2017-12-05 DIAGNOSIS — E785 Hyperlipidemia, unspecified: Secondary | ICD-10-CM | POA: Insufficient documentation

## 2017-12-05 DIAGNOSIS — I2510 Atherosclerotic heart disease of native coronary artery without angina pectoris: Secondary | ICD-10-CM

## 2017-12-05 DIAGNOSIS — I252 Old myocardial infarction: Secondary | ICD-10-CM | POA: Diagnosis not present

## 2017-12-05 DIAGNOSIS — Z96641 Presence of right artificial hip joint: Secondary | ICD-10-CM | POA: Insufficient documentation

## 2017-12-05 DIAGNOSIS — K219 Gastro-esophageal reflux disease without esophagitis: Secondary | ICD-10-CM | POA: Diagnosis not present

## 2017-12-05 DIAGNOSIS — R269 Unspecified abnormalities of gait and mobility: Secondary | ICD-10-CM | POA: Insufficient documentation

## 2017-12-05 DIAGNOSIS — Z09 Encounter for follow-up examination after completed treatment for conditions other than malignant neoplasm: Secondary | ICD-10-CM | POA: Insufficient documentation

## 2017-12-05 DIAGNOSIS — M9701XA Periprosthetic fracture around internal prosthetic right hip joint, initial encounter: Secondary | ICD-10-CM | POA: Insufficient documentation

## 2017-12-05 DIAGNOSIS — I169 Hypertensive crisis, unspecified: Secondary | ICD-10-CM

## 2017-12-05 DIAGNOSIS — I350 Nonrheumatic aortic (valve) stenosis: Secondary | ICD-10-CM | POA: Diagnosis not present

## 2017-12-05 DIAGNOSIS — R600 Localized edema: Secondary | ICD-10-CM | POA: Insufficient documentation

## 2017-12-05 NOTE — Progress Notes (Signed)
Subjective:    Patient ID: Maureen Morales, female    DOB: [birth year], 84 y.o.   MRN: [id number]  HPI 84 y.o. female with history of CAD, HTN, right hip hemiarthroplasty (hip precautions) presents for follow up for for periprosthetic femur fracture and scalp hematoma.  Last clinic visit 08/23/17.   Since last visit patient went to the EDx2 for syncope.  Notes reviewed.  Family present, provides history. Family notes first time was due to medications on empty stomach.  Her BP was elevated on second occurrence.  She has completed therapies. She was released by Ortho.  Denies falls. Pain is relatively controlled with tylenol. Right heel is improving. LE edema is same; she has not changed her diet.  BP is elevated, but states it is normally better.  States she is following up with PCP.  Pain Inventory Average Pain 7 Pain Right Now 7 My pain is intermittent and aching  In the last 24 hours, has pain interfered with the following? General activity 7 Relation with others 7 Enjoyment of life 7 What TIME of day is your pain at its worst? night Sleep (in general) Good  Pain is worse with: some activites Pain improves with: rest, therapy/exercise and medication Relief from Meds: 9  Mobility walk with assistance use a walker how many minutes can you walk? 15 ability to climb steps?  no do you drive?  no use a wheelchair needs help with transfers  Function not employed: date last employed . I need assistance with the following:  dressing, bathing, toileting, meal prep, household duties and shopping  Neuro/Psych bowel control problems trouble walking  Prior Studies Any changes since last visit?  no  Physicians involved in your care Any changes since last visit?  no   Family History  Problem Relation Age of Onset  . Breast cancer Mother 1  . Colon cancer Father   . Stroke Sister   . Cancer Mother   . Breast cancer Sister 26  . Colon cancer Brother   . Colon cancer Brother      Social History   Socioeconomic History  . Marital status: Married    Spouse name: [name]  . Number of children: 4  . Years of education: 67  . Highest education level: High school graduate  Occupational History  . Not on file  Social Needs  . Financial resource strain: Not on file  . Food insecurity:    Worry: Not on file    Inability: Not on file  . Transportation needs:    Medical: Not on file    Non-medical: Not on file  Tobacco Use  . Smoking status: Never Smoker  . Smokeless tobacco: Never Used  Substance and Sexual Activity  . Alcohol use: No    Frequency: Never  . Drug use: No  . Sexual activity: Never  Lifestyle  . Physical activity:    Days per week: Not on file    Minutes per session: Not on file  . Stress: Not on file  Relationships  . Social connections:    Talks on phone: Not on file    Gets together: Not on file    Attends religious service: Not on file    Active member of club or organization: Not on file    Attends meetings of clubs or organizations: Not on file    Relationship status: Not on file  Other Topics Concern  . Not on file  Social History Narrative   ** Merged  History Encounter **       Married 4 children Never smoker Denies alcohol use Full Code   Past Surgical History:  Procedure Laterality Date  . ABDOMINAL HYSTERECTOMY    . BLADDER SURGERY    . CARDIAC CATHETERIZATION    . CHOLECYSTECTOMY    . COLONOSCOPY     06/16/1987, 02/20/1995, 05/04/1999, 02/14/2005, 05/17/2007  . ESOPHAGOGASTRODUODENOSCOPY     05/11/1987, 06/13/1995, 10/09/1995, 05/04/1999, 01/16/2002  . FLEXIBLE SIGMOIDOSCOPY N/A 05/02/2017   Procedure: FLEXIBLE SIGMOIDOSCOPY;  Surgeon: Manya Silvas, MD;  Location: [facility] ENDOSCOPY;  Service: Endoscopy;  Laterality: N/A;  . HEMORRHOID SURGERY N/A 05/14/2017   Procedure: EXTERNAL THROMBOSED HEMORRHOIDECTOMY;  Surgeon: Robert Bellow, MD;  Location: [facility] ORS;  Service: General;  Laterality: N/A;  . HIP  ARTHROPLASTY Right 02/17/2017   Procedure: ARTHROPLASTY BIPOLAR HIP (HEMIARTHROPLASTY);  Surgeon: Claud Kelp, MD;  Location: [facility] ORS;  Service: Orthopedics;  Laterality: Right;  . LEFT HEART CATH AND CORONARY ANGIOGRAPHY N/A 08/31/2016   Procedure: Left Heart Cath and Coronary Angiography;  Surgeon: Wellington Hampshire, MD;  Location: [place] CV LAB;  Service: Cardiovascular;  Laterality: N/A;  . ORIF PERIPROSTHETIC FRACTURE Right 05/27/2017  . ORIF PERIPROSTHETIC FRACTURE Right 05/27/2017   Procedure: OPEN REDUCTION INTERNAL FIXATION (ORIF) PERIPROSTHETIC FRACTURE;  Surgeon: Mcarthur Rossetti, MD;  Location: [place];  Service: Orthopedics;  Laterality: Right;  . TOTAL HIP ARTHROPLASTY Right   . Theadora Rama prolapse     Past Medical History:  Diagnosis Date  . Aortic stenosis    a. 08/2016 Echo: Hyperdynamic LV fxn, mild AS (mean grad 36mmHg), mild MR, mild PAH; b. 05/2017 Echo: Hyperdynamic LV fxn, mod AS (mean grad 51mmHg, valve area 1.84). PASP 136mmHg.  . Asthma without status asthmaticus    unspecified  . Colitis    unspecified  . Complication of anesthesia    sensitive to anesthesia  . Depression   . Diabetes mellitus type 2, uncomplicated ([place])   . Essential hypertension   . GERD (gastroesophageal reflux disease)   . 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 Hyperlipidemia    unspecified  . Hypertension    a. 08/2017 Renal U/S: No RAS.  [name] Myocardial infarction ([place]) 2018  . Non-obstructive CAD (coronary artery disease)    a. 08/2016 NSTEMI/Cath: mild to mod nonobs dzs including 60% mRCA stenosis-->Med rx.  . Peripheral vascular disease ([place])   . Positive H. pylori test   . Viral gastroenteritis due to Norwalk-like agent Nov 2012   BP (!) 180/79   Pulse 61   Ht 5\' 1"  (1.549 m)   Wt 134 lb 12.8 oz (61.1 kg)   SpO2 95%   BMI 25.47 kg/m   Opioid Risk Score:   Fall Risk Score:  `1  Depression screen PHQ 2/9  Depression screen [facility] 2/9 12/05/2017 05/22/2015  07/08/2014  Decreased Interest 0 0 0  Down, Depressed, Hopeless 0 0 0  PHQ - 2 Score 0 0 0  Some recent data might be hidden     Review of Systems  Constitutional: Negative.   HENT: Negative.   Eyes: Negative.   Respiratory: Positive for wheezing.   Cardiovascular: Negative.   Gastrointestinal: Positive for diarrhea.  Endocrine: Negative.        High blood sugar  Genitourinary: Negative.   Musculoskeletal: Positive for arthralgias, gait problem and joint swelling.  Skin: Negative.   Allergic/Immunologic: Negative.   Hematological: Negative.   Psychiatric/Behavioral: Negative.   All  other systems reviewed and are negative.     Objective:   Physical Exam Constitutional: She appears well-developed and well-nourished. No distress.  HENT: Normocephalic and atraumatic.  Eyes: EOM are normal. No discharge.  Cardiovascular: RRR. Murmur heard. No JVD. Respiratory: Effort normal. Clear.  GI: Bowel sounds are normal. She exhibits no distension.  Musculoskeletal:  B/l foot edema Neurological: She is alert and oriented.  Motor: B/l UE: 4+/5 proximal to distal RLE: HF, 4+/5, ADF/PF 4+/5  LLE: HF, KE 4+/5, ADF/PF 5/5  Skin: Right heel with eschar, non-fluctuant.   Psychiatric: She has a normal mood and affect. Her behavior is normal.     Assessment & Plan:  84 y.o. female with history of CAD, HTN, right hip hemiarthroplasty (hip precautions) presents for follow up for periprosthetic femur fracture and scalp hematoma.  1.  Deficits with mobility, transfers, and self-care secondary to right peri-prosthetic hip fracture.   Educated on continues HEP  Released by Ortho  2. Pain Management  Controlled at present  Cont ice  Encourage Lidoderm patch  3. Right heel ulcer  Improving  Cont prevalon boot qhs.    4. Gait abnormality  Cont walker for safety  Cont therapies  5. Right foot edema  U/S neg for DVT  Encouraged elevated  Encouraged low salt diet, pt states she is not  willing to change her diet, no changes  6. HTN  Hypertensive crisis today, following up with PCP

## 2017-12-12 DIAGNOSIS — H2513 Age-related nuclear cataract, bilateral: Secondary | ICD-10-CM | POA: Diagnosis not present

## 2017-12-12 LAB — HM DIABETES EYE EXAM

## 2017-12-18 ENCOUNTER — Ambulatory Visit: Payer: Medicare Other | Admitting: Physician Assistant

## 2017-12-19 ENCOUNTER — Encounter: Payer: Self-pay | Admitting: Nurse Practitioner

## 2017-12-19 ENCOUNTER — Ambulatory Visit (INDEPENDENT_AMBULATORY_CARE_PROVIDER_SITE_OTHER): Payer: Medicare Other | Admitting: Nurse Practitioner

## 2017-12-19 VITALS — BP 172/70 | HR 58 | Ht 60.0 in | Wt 133.5 lb

## 2017-12-19 DIAGNOSIS — I2510 Atherosclerotic heart disease of native coronary artery without angina pectoris: Secondary | ICD-10-CM

## 2017-12-19 DIAGNOSIS — I350 Nonrheumatic aortic (valve) stenosis: Secondary | ICD-10-CM

## 2017-12-19 DIAGNOSIS — E782 Mixed hyperlipidemia: Secondary | ICD-10-CM

## 2017-12-19 DIAGNOSIS — I10 Essential (primary) hypertension: Secondary | ICD-10-CM | POA: Diagnosis not present

## 2017-12-19 NOTE — Progress Notes (Signed)
Office Visit    Patient Name: Maureen Morales Date of Encounter: 12/19/2017  Primary Care Provider:  [name], MD Primary Cardiologist:  [name], MD  Chief Complaint    84 year old female with a history of moderate aortic stenosis, difficult to control hypertension, diabetes, hyperlipidemia, prior non-STEMI, and nonobstructive CAD, who presents for follow-up related to blood pressure management.  Past Medical History    Past Medical History:  Diagnosis Date  . Aortic stenosis    a. 08/2016 Echo: Hyperdynamic LV fxn, mild AS (mean grad 28mmHg), mild MR, mild PAH; b. 05/2017 Echo: Hyperdynamic LV fxn, mod AS (mean grad 57mmHg, valve area 1.84). PASP 154mmHg.  . Asthma without status asthmaticus    unspecified  . Colitis    unspecified  . Complication of anesthesia    sensitive to anesthesia  . Depression   . Diabetes mellitus type 2, uncomplicated ([place])   . Essential hypertension   . GERD (gastroesophageal reflux disease)   . 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 Hyperlipidemia    unspecified  . Hypertension    a. 08/2017 Renal U/S: No RAS.  [name] Myocardial infarction ([place]) 2018  . Non-obstructive CAD (coronary artery disease)    a. 08/2016 NSTEMI/Cath: mild to mod nonobs dzs including 60% mRCA stenosis-->Med rx.  . Peripheral vascular disease ([place])   . Positive H. pylori test   . Viral gastroenteritis due to Norwalk-like agent Nov 2012   Past Surgical History:  Procedure Laterality Date  . ABDOMINAL HYSTERECTOMY    . BLADDER SURGERY    . CARDIAC CATHETERIZATION    . CHOLECYSTECTOMY    . COLONOSCOPY     06/16/1987, 02/20/1995, 05/04/1999, 02/14/2005, 05/17/2007  . ESOPHAGOGASTRODUODENOSCOPY     05/11/1987, 06/13/1995, 10/09/1995, 05/04/1999, 01/16/2002  . FLEXIBLE SIGMOIDOSCOPY N/A 05/02/2017   Procedure: FLEXIBLE SIGMOIDOSCOPY;  Surgeon: Manya Silvas, MD;  Location[facility]COPY;  Service: Endoscopy;  Laterality: N/A;  . HEMORRHOID SURGERY N/A  05/14/2017   Procedure: EXTERNAL THROMBOSED HEMORRHOIDECTOMY;  Surgeon: Robert Bellow, MD;  Location: [facility] ORS;  Service: General;  Laterality: N/A;  . HIP ARTHROPLASTY Right 02/17/2017   Procedure: ARTHROPLASTY BIPOLAR HIP (HEMIARTHROPLASTY);  Surgeon: Claud Kelp, MD;  Location: [facility] ORS;  Service: Orthopedics;  Laterality: Right;  . LEFT HEART CATH AND CORONARY ANGIOGRAPHY N/A 08/31/2016   Procedure: Left Heart Cath and Coronary Angiography;  Surgeon: Wellington Hampshire, MD;  Location: [place] CV LAB;  Service: Cardiovascular;  Laterality: N/A;  . ORIF PERIPROSTHETIC FRACTURE Right 05/27/2017  . ORIF PERIPROSTHETIC FRACTURE Right 05/27/2017   Procedure: OPEN REDUCTION INTERNAL FIXATION (ORIF) PERIPROSTHETIC FRACTURE;  Surgeon: Mcarthur Rossetti, MD;  Location: [place];  Service: Orthopedics;  Laterality: Right;  . TOTAL HIP ARTHROPLASTY Right   . uterian prolapse      Allergies  Allergies  Allergen Reactions  . Asa [Aspirin] Anaphylaxis  . Aspirin Swelling  . Atropine Other (See Comments)    Reaction: unknown  . Belladonna Alkaloids Other (See Comments)    Reaction: unknown  . Codeine Other (See Comments)    Reaction: unknown  . Darvon Other (See Comments)    Reaction: unknown  . Darvon [Propoxyphene] Nausea And Vomiting  . Demerol [Meperidine] Nausea And Vomiting  . Ferrous Sulfate Diarrhea  . Meperidine And Related   . Methocarbamol Other (See Comments)    Reaction: unknown  . Penicillins Other (See Comments)    Reaction: unknown Has patient had a PCN  reaction causing immediate rash, facial/tongue/throat swelling, SOB or lightheadedness with hypotension: Unknown Has patient had a PCN reaction causing severe rash involving mucus membranes or skin necrosis: Unknown Has patient had a PCN reaction that required hospitalization: Unknown Has patient had a PCN reaction occurring within the last 10 years: Unknown If all of the above answers are "NO", then may proceed  with Cephalosporin use.   [name] Penicillins Other (See Comments)    Pt and family unsure of details  . Sulfa Antibiotics Other (See Comments)    Reaction: unknown  . Iron Other (See Comments)    High dose prescription gives her diarrhea    History of Present Illness    84 year old female with the above complex past medical history including moderate aortic stenosis, nonobstructive CAD, hypertension, hyperlipidemia, and diabetes.  She suffered a non-STEMI in May 2018 in the setting of uncontrolled hypertension.  Echo at that time showed normal LV function with mild aortic stenosis.  Diagnostic catheterization revealed mild to moderate nonobstructive CAD including a 6% mid right coronary artery stenosis.  She has since been medically managed.  In the setting of difficult to control hypertension, she is also undergone renal artery duplex which has shown no evidence of renal artery stenosis.  In October 2018 and again in January 2019, she suffered right hip fractures requiring surgical repair.  Her most recent echocardiogram in January 2019 showed normal LV function with moderate aortic stenosis and a mean gradient of 29 mmHg with a valve area of 1.84.  Pulmonary artery systolic pressure was severely increased at 100 mmHg.  She was last seen in cardiology clinic in July shortly after an ER visit for confusion and weakness after taking multiple blood pressure medications on an empty stomach.  Following the ER visit, blood pressure medications were discontinued with the exception of carvedilol 6.25 mg twice daily.  At her clinic visit on July 9, she was hypertensive and we added back HCTZ 12.5 mg daily.  2 days later, she was in the emergency department following a syncopal spell.  In the ED, she was hypertensive.  Blood pressure improved while in the emergency department and following discharge, losartan 100 mg was added back to her daily regimen.  A follow-up basic metabolic panel a week later showed stable  renal function and potassium.  Over the past month, [name] has continued to check her blood pressure twice a day.  She is typically running in the 140s with occasional pressures in the 130s and also occasional pressures in the 150s.  She has had no recurrence of presyncope or syncope.  She is tolerating beta-blocker, HCTZ, nitrate, and ARB well.  She denies chest pain, dyspnea, palpitations, PND, orthopnea, dizziness, syncope, edema, or early satiety.  Home Medications    Prior to Admission medications   Medication Sig Start Date End Date Taking? Authorizing Provider  atorvastatin (LIPITOR) 40 MG tablet TAKE ONE TABLET BY MOUTH EVERY DAY 09/27/17   [name], MD  carvedilol (COREG) 6.25 MG tablet Take 6.25 mg by mouth 2 (two) times daily with a meal.    [provider]  clindamycin (CLEOCIN) 300 MG capsule 2 capsules one hour before dental procedure 06/18/17   [name], MD  FLORA-Q [facility]) CAPS capsule Take 1 capsule by mouth daily.    [provider]  glipiZIDE (GLUCOTROL) 5 MG tablet Take 0.5 tablets (2.5 mg total) by mouth daily before breakfast. Patient taking differently: Take 2.5 mg by mouth every evening.  05/30/17   [name], MD  glucose blood (BAYER CONTOUR TEST) test strip Use as instructed 05/30/17   [name], MD  hydrochlorothiazide (MICROZIDE) 12.5 MG capsule Take 1 capsule (12.5 mg total) by mouth daily. 11/06/17 02/04/18  Theora Gianotti, NP  isosorbide mononitrate (IMDUR) 60 MG 24 hr tablet Take 1 tablet (60 mg total) by mouth daily. 09/07/17 12/06/17  Wellington Hampshire, MD  loratadine (CLARITIN) 10 MG tablet Take 10 mg daily by mouth.     [provider]  losartan (COZAAR) 100 MG tablet Take 1 tablet (100 mg total) by mouth daily. 11/10/17   [name], MD  nitroGLYCERIN (NITROSTAT) 0.4 MG SL tablet Place 1 tablet (0.4 mg total) under the tongue every 5 (five) minutes as needed for chest pain. 09/02/16   Nicholes Mango, MD    sertraline (ZOLOFT) 50 MG tablet Take 1.5 tablets (75 mg total) by mouth daily. 11/06/17   [name], MD  vitamin C (ASCORBIC ACID) 500 MG tablet Take 1 tablet (500 mg total) by mouth 2 (two) times daily. 05/04/17   [name], MD    Review of Systems    Doing well since her last visit without any recurrent presyncope or syncope.  No chest pain, dyspnea, palpitations, PND, orthopnea, dizziness, syncope, edema, or early satiety.  All other systems reviewed and are otherwise negative except as noted above.  Physical Exam    VS:  BP (!) 172/70 (BP Location: Left Arm, Patient Position: Sitting, Cuff Size: Normal)   Pulse (!) 58   Ht 5' (1.524 m)   Wt 133 lb 8 oz (60.6 kg)   BMI 26.07 kg/m  , BMI Body mass index is 26.07 kg/m. GEN: Well nourished, well developed, in no acute distress.  HEENT: normal.  Neck: Supple, no JVD, carotid bruits, or masses. Cardiac: RRR, 2/6 systolic ejection murmur at the right upper sternal border, no rubs, or gallops. No clubbing, cyanosis, edema.  Radials/DP/PT 2+ and equal bilaterally.  Respiratory:  Respirations regular and unlabored, clear to auscultation bilaterally. GI: Soft, nontender, nondistended, BS + x 4. MS: no deformity or atrophy. Skin: warm and dry, no rash. Neuro:  Strength and sensation are intact. Psych: Normal affect.  Accessory Clinical Findings    ECG -sinus bradycardia, 58, poor R wave progression, no acute ST-T changes.  Assessment & Plan    1.  Essential hypertension: This has been difficult to control historically as she has been very sensitive to changes in medications in the past.  She was most recently seen in the emergency department following a syncopal spell in mid July.  Blood pressure was elevated at that time.  Since then, she has been taking carvedilol 6.25 mg twice daily, hydrochlorthiazide 12.5 mg daily, Imdur 60 mg daily, and losartan 100 mg daily.  Losartan was added after most recent ER visit.  Blood pressures  at home have been trending in the 140s and she has had no recurrence of presyncope or syncope.  Pressure is elevated today at 172/70 but given trends at home, I am reluctant to make any changes.  She and her daughter will continue to follow her blood pressures at home.  2.  Nonobstructive CAD: No chest pain.  Continue beta-blocker, nitrate, and statin therapy.  3.  Moderate aortic stenosis: Plan for follow-up echo in early 2019.  4.  Pulmonary hypertension: Echo in January showed significant pulmonary arterial pressure elevation.  This was felt to be secondary to elevated  systemic pressures.  Plan to follow-up repeat echo in January.  5.  HL:  LDL 68 in 11/2016.  Cont statin rx.  6.  Disposition: Follow-up with Dr. Fletcher Anon in 3 months or sooner if necessary.   Murray Hodgkins, NP 12/19/2017, 12:31 PM

## 2017-12-19 NOTE — Patient Instructions (Signed)
Medication Instructions: Your physician recommends that you continue on your current medications as directed. Please refer to the Current Medication list given to you today.  If you need a refill on your cardiac medications before your next appointment, please call your pharmacy.   Follow-Up: Your physician wants you to follow-up in 3 months with Dr. Arida.   Thank you for choosing [facility] [place]!    

## 2017-12-24 ENCOUNTER — Other Ambulatory Visit: Payer: Self-pay | Admitting: Internal Medicine

## 2018-01-01 ENCOUNTER — Encounter: Payer: Self-pay | Admitting: Internal Medicine

## 2018-01-03 ENCOUNTER — Other Ambulatory Visit: Payer: Self-pay | Admitting: Internal Medicine

## 2018-01-03 DIAGNOSIS — M79651 Pain in right thigh: Secondary | ICD-10-CM

## 2018-01-04 ENCOUNTER — Ambulatory Visit (INDEPENDENT_AMBULATORY_CARE_PROVIDER_SITE_OTHER): Payer: Medicare Other

## 2018-01-04 DIAGNOSIS — Z471 Aftercare following joint replacement surgery: Secondary | ICD-10-CM | POA: Diagnosis not present

## 2018-01-04 DIAGNOSIS — M79651 Pain in right thigh: Secondary | ICD-10-CM | POA: Diagnosis not present

## 2018-01-04 DIAGNOSIS — Z96641 Presence of right artificial hip joint: Secondary | ICD-10-CM | POA: Diagnosis not present

## 2018-01-09 ENCOUNTER — Encounter (INDEPENDENT_AMBULATORY_CARE_PROVIDER_SITE_OTHER): Payer: Self-pay | Admitting: Orthopaedic Surgery

## 2018-01-09 ENCOUNTER — Ambulatory Visit (INDEPENDENT_AMBULATORY_CARE_PROVIDER_SITE_OTHER): Payer: Medicare Other | Admitting: Orthopaedic Surgery

## 2018-01-09 DIAGNOSIS — I2510 Atherosclerotic heart disease of native coronary artery without angina pectoris: Secondary | ICD-10-CM | POA: Diagnosis not present

## 2018-01-09 DIAGNOSIS — M9701XD Periprosthetic fracture around internal prosthetic right hip joint, subsequent encounter: Secondary | ICD-10-CM

## 2018-01-09 NOTE — Progress Notes (Signed)
The patient is a very active and pleasant 84 year old female who is 7 months status post open reduction internal fixation of a periprosthetic right proximal femur fracture.  This is around the hemiarthroplasty that was done elsewhere.  She had a hemiarthroplasty and then during the healing postoperative.  She fell sustaining a periprosthetic fracture below that.  I took her to the operating room in January of this year and placed along para-articular plate and screws as well as cables.  She does ablate with a rolling walker and is hurt ever since surgery but was improving.  Recently she went to the beach and going up and down stairs she started developed worsening thigh pain as well as bruising.  There are x-rays that recently done a few days ago on her system for me to review.  On exam she does have bruising on the inner thigh area and has pain with rotation of her hip and with flexion extension of the knee.  I did review his x-rays and it looks like everything is healing nicely.  It does not look like there is a nonunion but certainly that can be a possibility.  Of bigger concern is just the proximal hemiarthroplasty and the cement mantle and could there be loosening that is causing pain.  At this point I would like to obtain a CT scan of her right proximal femur to assess the whether or not there is a nonunion of the fracture.  We will see her back in 2 weeks.  CT scan and talk about what potential treatment modalities could potentially help.

## 2018-01-10 ENCOUNTER — Other Ambulatory Visit (INDEPENDENT_AMBULATORY_CARE_PROVIDER_SITE_OTHER): Payer: Self-pay

## 2018-01-10 DIAGNOSIS — S82899A Other fracture of unspecified lower leg, initial encounter for closed fracture: Secondary | ICD-10-CM

## 2018-01-10 DIAGNOSIS — S72001A Fracture of unspecified part of neck of right femur, initial encounter for closed fracture: Secondary | ICD-10-CM

## 2018-01-18 ENCOUNTER — Ambulatory Visit
Admission: RE | Admit: 2018-01-18 | Discharge: 2018-01-18 | Disposition: A | Discharge status: Home / Self Care | Payer: Medicare Other | Source: Ambulatory Visit | Attending: Orthopaedic Surgery | Admitting: Orthopaedic Surgery

## 2018-01-18 DIAGNOSIS — S82899A Other fracture of unspecified lower leg, initial encounter for closed fracture: Secondary | ICD-10-CM | POA: Diagnosis present

## 2018-01-18 DIAGNOSIS — Z9689 Presence of other specified functional implants: Secondary | ICD-10-CM | POA: Diagnosis not present

## 2018-01-18 DIAGNOSIS — S82899D Other fracture of unspecified lower leg, subsequent encounter for closed fracture with routine healing: Secondary | ICD-10-CM | POA: Diagnosis not present

## 2018-01-18 DIAGNOSIS — M9711XA Periprosthetic fracture around internal prosthetic right knee joint, initial encounter: Secondary | ICD-10-CM | POA: Diagnosis not present

## 2018-01-23 ENCOUNTER — Ambulatory Visit (INDEPENDENT_AMBULATORY_CARE_PROVIDER_SITE_OTHER): Payer: Medicare Other | Admitting: Orthopaedic Surgery

## 2018-01-23 ENCOUNTER — Telehealth: Payer: Self-pay | Admitting: Internal Medicine

## 2018-01-23 ENCOUNTER — Encounter (INDEPENDENT_AMBULATORY_CARE_PROVIDER_SITE_OTHER): Payer: Self-pay | Admitting: Orthopaedic Surgery

## 2018-01-23 DIAGNOSIS — M9701XD Periprosthetic fracture around internal prosthetic right hip joint, subsequent encounter: Secondary | ICD-10-CM

## 2018-01-23 DIAGNOSIS — I2510 Atherosclerotic heart disease of native coronary artery without angina pectoris: Secondary | ICD-10-CM | POA: Diagnosis not present

## 2018-01-23 NOTE — Telephone Encounter (Signed)
Copied from [place] [id number]. Topic: General - Other >> Jan 23, 2018  4:16 PM Janace Aris A wrote: Reason for CRM: Patient's daughter wanted to know if there is any way the referral for the bone density scan can be done in [place], since they live close. She also said if [facility] is highly recommended then they will make a way to be there.   Please advise

## 2018-01-23 NOTE — Progress Notes (Signed)
The patient is here for follow-up 8 months after open reduction and fixation of a periprosthetic fracture of the right femur at the level of a right hip hemiarthroplasty.  She is been having continued pain from her groin all the way down to her foot.  She is 84 years old.  She is ambulate with a walker.  We sent her for CT scan to make sure there was not a nonunion or hardware failure.  She still has the same complaints of pain although Tylenol does help.  She would like to be off her walker.  She spends a lot of her time in a wheelchair the family states.  On exam I can easily put her right hip to rotation in her right knee through flexion extension and her pain seems to be minimal.  There is not a localized area of pain.  CT scan does not show any type of nonunion.  It does look like there is good bony healing throughout the fracture.  At this point I would not recommend any further surgery.  I would like to see her though back in 3 months with an AP and lateral of the right femur as well as a AP and lateral of the lumbar spine.  All question concerns were answered and addressed.

## 2018-01-24 NOTE — Telephone Encounter (Signed)
I am confused. Location for DEXA os [facility]?  Or is somebody mistaken.  [place] locale was what I ordered.    Not sure how [facility] got chosen!  Did I push the wrong button?  Good grief!   Please change locale to [facility]

## 2018-01-24 NOTE — Telephone Encounter (Signed)
Referral request

## 2018-01-25 NOTE — Telephone Encounter (Signed)
Ok thanks,  I 've printed out the appt and will take it to [place]

## 2018-01-25 NOTE — Telephone Encounter (Signed)
I don't know where [facility] came from, but I'm pretty sure that they do not do DEXA scans. Your order says [facility]. There must of been a misunderstanding from somebody.

## 2018-01-30 ENCOUNTER — Ambulatory Visit: Payer: Medicare Other

## 2018-01-31 ENCOUNTER — Ambulatory Visit
Admission: RE | Admit: 2018-01-31 | Discharge: 2018-01-31 | Disposition: A | Discharge status: Home / Self Care | Payer: Medicare Other | Source: Ambulatory Visit | Attending: Internal Medicine | Admitting: Internal Medicine

## 2018-01-31 DIAGNOSIS — Z780 Asymptomatic menopausal state: Secondary | ICD-10-CM | POA: Diagnosis not present

## 2018-01-31 DIAGNOSIS — M8589 Other specified disorders of bone density and structure, multiple sites: Secondary | ICD-10-CM | POA: Diagnosis not present

## 2018-01-31 DIAGNOSIS — Z96641 Presence of right artificial hip joint: Secondary | ICD-10-CM | POA: Insufficient documentation

## 2018-02-04 ENCOUNTER — Telehealth: Payer: Self-pay | Admitting: Internal Medicine

## 2018-02-04 NOTE — Telephone Encounter (Signed)
Can you try to get Prolia authorized for Maureen Morales? She had a right hip fracture in 2018.  Followed by a femur fracture same leg.  T scores are not diagnostic but the hip fracture should qualify her

## 2018-02-09 ENCOUNTER — Other Ambulatory Visit: Payer: Self-pay | Admitting: Internal Medicine

## 2018-02-09 MED ORDER — PREDNISONE 5 MG PO TABS: ORAL_TABLET | ORAL | 0 refills | Status: DC

## 2018-02-09 MED ORDER — HYDRALAZINE HCL 25 MG PO TABS: 25.0000 mg | ORAL_TABLET | Freq: Three times a day (TID) | ORAL | 3 refills | Status: DC | PRN

## 2018-02-10 ENCOUNTER — Other Ambulatory Visit: Payer: Self-pay | Admitting: Internal Medicine

## 2018-02-10 DIAGNOSIS — E119 Type 2 diabetes mellitus without complications: Secondary | ICD-10-CM

## 2018-02-10 DIAGNOSIS — G44309 Post-traumatic headache, unspecified, not intractable: Secondary | ICD-10-CM

## 2018-02-11 ENCOUNTER — Ambulatory Visit (INDEPENDENT_AMBULATORY_CARE_PROVIDER_SITE_OTHER): Payer: Medicare Other

## 2018-02-11 ENCOUNTER — Other Ambulatory Visit: Payer: Self-pay | Admitting: Internal Medicine

## 2018-02-11 ENCOUNTER — Other Ambulatory Visit (INDEPENDENT_AMBULATORY_CARE_PROVIDER_SITE_OTHER): Payer: Medicare Other

## 2018-02-11 DIAGNOSIS — G44309 Post-traumatic headache, unspecified, not intractable: Secondary | ICD-10-CM

## 2018-02-11 DIAGNOSIS — E119 Type 2 diabetes mellitus without complications: Secondary | ICD-10-CM

## 2018-02-11 DIAGNOSIS — M4802 Spinal stenosis, cervical region: Secondary | ICD-10-CM | POA: Diagnosis not present

## 2018-02-11 DIAGNOSIS — G441 Vascular headache, not elsewhere classified: Secondary | ICD-10-CM

## 2018-02-11 LAB — CBC WITH DIFFERENTIAL/PLATELET
BASOS ABS: 0.1 10*3/uL (ref 0.0–0.1)
BASOS PCT: 0.4 % (ref 0.0–3.0)
EOS ABS: 0.3 10*3/uL (ref 0.0–0.7)
EOS PCT: 2.2 % (ref 0.0–5.0)
HCT: 35.5 % — ABNORMAL LOW (ref 36.0–46.0)
Hemoglobin: 11.9 g/dL — ABNORMAL LOW (ref 12.0–15.0)
LYMPHS PCT: 19.2 % (ref 12.0–46.0)
Lymphs Abs: 2.3 10*3/uL (ref 0.7–4.0)
MCHC: 33.4 g/dL (ref 30.0–36.0)
MCV: 83.3 fl (ref 78.0–100.0)
Monocytes Absolute: 0.8 10*3/uL (ref 0.1–1.0)
Monocytes Relative: 7.1 % (ref 3.0–12.0)
NEUTROS ABS: 8.3 10*3/uL — AB (ref 1.4–7.7)
NEUTROS PCT: 71.1 % (ref 43.0–77.0)
PLATELETS: 233 10*3/uL (ref 150.0–400.0)
RBC: 4.26 Mil/uL (ref 3.87–5.11)
RDW: 14.7 % (ref 11.5–15.5)
WBC: 11.7 10*3/uL — ABNORMAL HIGH (ref 4.0–10.5)

## 2018-02-11 LAB — COMPREHENSIVE METABOLIC PANEL
ALT: 24 U/L (ref 0–35)
AST: 13 U/L (ref 0–37)
Albumin: 4.3 g/dL (ref 3.5–5.2)
Alkaline Phosphatase: 136 U/L — ABNORMAL HIGH (ref 39–117)
BILIRUBIN TOTAL: 0.4 mg/dL (ref 0.2–1.2)
BUN: 40 mg/dL — ABNORMAL HIGH (ref 6–23)
CO2: 27 meq/L (ref 19–32)
Calcium: 9.4 mg/dL (ref 8.4–10.5)
Chloride: 102 mEq/L (ref 96–112)
Creatinine, Ser: 1.02 mg/dL (ref 0.40–1.20)
GFR: 53.76 mL/min — AB (ref >60.00)
GLUCOSE: 160 mg/dL — AB (ref 70–99)
Potassium: 3.5 mEq/L (ref 3.5–5.1)
Sodium: 137 mEq/L (ref 135–145)
Total Protein: 7 g/dL (ref 6.0–8.3)

## 2018-02-11 LAB — HEMOGLOBIN A1C: Hgb A1c MFr Bld: 7.7 % — ABNORMAL HIGH (ref 4.6–6.5)

## 2018-02-11 LAB — SEDIMENTATION RATE: SED RATE: 12 mm/h (ref 0–30)

## 2018-02-11 LAB — C-REACTIVE PROTEIN: CRP: 0.3 mg/dL — AB (ref 0.5–20.0)

## 2018-02-12 ENCOUNTER — Other Ambulatory Visit: Payer: Self-pay | Admitting: Internal Medicine

## 2018-02-12 NOTE — Telephone Encounter (Signed)
Insurance verification for Prolia filed on [employer] Portal. 

## 2018-02-27 DIAGNOSIS — Z23 Encounter for immunization: Secondary | ICD-10-CM | POA: Diagnosis not present

## 2018-03-07 ENCOUNTER — Ambulatory Visit: Payer: Medicare Other

## 2018-03-21 ENCOUNTER — Ambulatory Visit (INDEPENDENT_AMBULATORY_CARE_PROVIDER_SITE_OTHER): Payer: Medicare Other | Admitting: Cardiovascular Disease

## 2018-03-21 VITALS — BP 138/72 | HR 65 | Wt 140.5 lb

## 2018-03-21 DIAGNOSIS — E785 Hyperlipidemia, unspecified: Secondary | ICD-10-CM

## 2018-03-21 DIAGNOSIS — I10 Essential (primary) hypertension: Secondary | ICD-10-CM | POA: Diagnosis not present

## 2018-03-21 DIAGNOSIS — I359 Nonrheumatic aortic valve disorder, unspecified: Secondary | ICD-10-CM | POA: Diagnosis not present

## 2018-03-21 DIAGNOSIS — I2510 Atherosclerotic heart disease of native coronary artery without angina pectoris: Secondary | ICD-10-CM | POA: Diagnosis not present

## 2018-03-21 DIAGNOSIS — I25118 Atherosclerotic heart disease of native coronary artery with other forms of angina pectoris: Secondary | ICD-10-CM

## 2018-03-21 NOTE — Progress Notes (Signed)
Cardiology Office Note   Date:  03/21/2018   ID:  Maureen Morales, DOB [birth year], MRN [id number]  PCP:  Maureen Mc, MD  Cardiologist:   Maureen Sacramento, MD   Chief Complaint  Patient presents with  . other    3 mo. F/u.Denies any Chest Pain. lower leg and foot edema. Currently wheezing.Medications reviewed verbally.       History of Present Illness: Maureen Morales is a 84 y.o. female who presents for a follow up regarding aortic stenosis coronary artery disease. She has chronic medical conditions that include hypertension, hyperlipidemia and type 2 diabetes.  She was hospitalized in May of 2018 with non-ST elevation myocardial infarction in the setting of uncontrolled hypertension. Troponin peaked at 5.79. Echocardiogram showed hyperdynamic LV systolic function, mild aortic stenosis with mean gradient of 14 mmHg, mild mitral regurgitation, mild pulmonary hypertension and trivial posterior pericardial effusion. Cardiac catheterization showed mild to moderate nonobstructive coronary artery disease. Worst stenosis was 60% in the mid right coronary artery. Outpatient renal artery duplex showed no evidence of renal artery stenosis. She fell in October, 2018 and fractured her right hip.  She underwent surgical repair.  She fell again in January and fractured the same.  Another surgery was performed.  Most recent echocardiogram in July 2019 showed normal LV systolic function, grade 2 diastolic dysfunction, moderate aortic stenosis with a mean gradient of 14 mmHg and valve area of 0.95.  She has difficult to control blood pressure with intolerance to antihypertensive medications.  However, she has been stable on current medications and her blood pressure has been reasonably controlled.  She feels well with no chest pain or shortness of breath.  She has bilateral leg edema which is worse at the end of the day and she is usually very active and stays on her feet all day long.  Past Medical  History:  Diagnosis Date  . Aortic stenosis    a. 08/2016 Echo: Hyperdynamic LV fxn, mild AS (mean grad 40mmHg), mild MR, mild PAH; b. 05/2017 Echo: Hyperdynamic LV fxn, mod AS (mean grad 84mmHg, valve area 1.84). PASP 135mmHg.  . Asthma without status asthmaticus    unspecified  . Colitis    unspecified  . Complication of anesthesia    sensitive to anesthesia  . Depression   . Diabetes mellitus type 2, uncomplicated ([place])   . Essential hypertension   . GERD (gastroesophageal reflux disease)   . 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 Hyperlipidemia    unspecified  . Hypertension    a. 08/2017 Renal U/S: No RAS.  [name] Myocardial infarction ([place]) 2018  . Non-obstructive CAD (coronary artery disease)    a. 08/2016 NSTEMI/Cath: mild to mod nonobs dzs including 60% mRCA stenosis-->Med rx.  . Peripheral vascular disease ([place])   . Positive H. pylori test   . Viral gastroenteritis due to [place]walk-like agent Nov 2012    Past Surgical History:  Procedure Laterality Date  . ABDOMINAL HYSTERECTOMY    . BLADDER SURGERY    . CARDIAC CATHETERIZATION    . CHOLECYSTECTOMY    . COLONOSCOPY     06/16/1987, 02/20/1995, 05/04/1999, 02/14/2005, 05/17/2007  . ESOPHAGOGASTRODUODENOSCOPY     05/11/1987, 06/13/1995, 10/09/1995, 05/04/1999, 01/16/2002  . FLEXIBLE SIGMOIDOSCOPY N/A 05/02/2017   Procedure: FLEXIBLE SIGMOIDOSCOPY;  Surgeon: Manya Silvas, MD;  Location: [facility] ENDOSCOPY;  Service: Endoscopy;  Laterality: N/A;  . HEMORRHOID SURGERY N/A 05/14/2017   Procedure: EXTERNAL THROMBOSED HEMORRHOIDECTOMY;  Surgeon:  Robert Bellow, MD;  Location: [facility] ORS;  Service: General;  Laterality: N/A;  . HIP ARTHROPLASTY Right 02/17/2017   Procedure: ARTHROPLASTY BIPOLAR HIP (HEMIARTHROPLASTY);  Surgeon: Claud Kelp, MD;  Location: [facility] ORS;  Service: Orthopedics;  Laterality: Right;  . LEFT HEART CATH AND CORONARY ANGIOGRAPHY N/A 08/31/2016   Procedure: Left Heart Cath and Coronary Angiography;   Surgeon: Wellington Hampshire, MD;  Location: [place] CV LAB;  Service: Cardiovascular;  Laterality: N/A;  . ORIF PERIPROSTHETIC FRACTURE Right 05/27/2017  . ORIF PERIPROSTHETIC FRACTURE Right 05/27/2017   Procedure: OPEN REDUCTION INTERNAL FIXATION (ORIF) PERIPROSTHETIC FRACTURE;  Surgeon: Mcarthur Rossetti, MD;  Location: [place];  Service: Orthopedics;  Laterality: Right;  . TOTAL HIP ARTHROPLASTY Right   . uterian prolapse       Current Outpatient Medications  Medication Sig Dispense Refill  . atorvastatin (LIPITOR) 40 MG tablet TAKE ONE TABLET BY MOUTH EVERY DAY 90 tablet 1  . carvedilol (COREG) 6.25 MG tablet Take 6.25 mg by mouth 2 (two) times daily with a meal.    . clindamycin (CLEOCIN) 300 MG capsule 2 capsules one hour before dental procedure 2 capsule 2  . FLORA-Q (FLORA-Q) CAPS capsule Take 1 capsule by mouth daily.    [name] glipiZIDE (GLUCOTROL) 5 MG tablet Take 0.5 tablets (2.5 mg total) by mouth daily before breakfast. (Patient taking differently: Take 2.5 mg by mouth every evening. ) 90 tablet 1  . glucose blood (BAYER CONTOUR TEST) test strip Use as instructed 100 each 5  . hydrALAZINE (APRESOLINE) 25 MG tablet Take 1 tablet (25 mg total) by mouth 3 (three) times daily as needed (Take 3 times daily as needed for systolic greater than 263.). 270 tablet 3  . hydrochlorothiazide (MICROZIDE) 12.5 MG capsule Take 1 capsule (12.5 mg total) by mouth daily. 30 capsule 6  . isosorbide mononitrate (IMDUR) 60 MG 24 hr tablet Take 1 tablet (60 mg total) by mouth daily. 90 tablet 1  . loratadine (CLARITIN) 10 MG tablet Take 10 mg daily by mouth.     . losartan (COZAAR) 100 MG tablet TAKE 1 TABLET BY MOUTH DAILY 90 tablet 1  . sertraline (ZOLOFT) 50 MG tablet Take 1.5 tablets (75 mg total) by mouth daily. 140 tablet 1  . vitamin C (ASCORBIC ACID) 500 MG tablet Take 1 tablet (500 mg total) by mouth 2 (two) times daily. 60 tablet 2  . nitroGLYCERIN (NITROSTAT) 0.4 MG SL tablet Place 1  tablet (0.4 mg total) under the tongue every 5 (five) minutes as needed for chest pain. (Patient not taking: Reported on 03/21/2018) 10 tablet 0   No current facility-administered medications for this visit.     Allergies:   Asa [aspirin]; Aspirin; Atropine; Belladonna alkaloids; Codeine; Darvon; Darvon [propoxyphene]; Demerol [meperidine]; Ferrous sulfate; Meperidine and related; Methocarbamol; Penicillins; Penicillins; Sulfa antibiotics; and Iron    Social History:  The patient  reports that she has never smoked. She has never used smokeless tobacco. She reports that she does not drink alcohol or use drugs.   Family History:  The patient's family history includes Breast cancer (age of onset: 44) in her sister; Breast cancer (age of onset: 57) in her mother; Cancer in her mother; Colon cancer in her brother, brother, and father; Stroke in her sister.    ROS:  Please see the history of present illness.   Otherwise, review of systems are positive for none.   All other systems are reviewed and negative.    PHYSICAL  EXAM: VS:  BP 138/72 (BP Location: Left Arm, Patient Position: Sitting, Cuff Size: Normal)   Pulse 65   Wt 140 lb 8 oz (63.7 kg)   BMI 27.44 kg/m  , BMI Body mass index is 27.44 kg/m. GEN: Well nourished, well developed, in no acute distress  HEENT: normal  Neck: no JVD, carotid bruits, or masses Cardiac: RRR; no rubs, or gallops, moderate bilateral leg edema . 3/6 crescendo decrescendo systolic murmur in the aortic area which is mid peaking Respiratory:  clear to auscultation bilaterally, normal work of breathing GI: soft, nontender, nondistended, + BS MS: no deformity or atrophy  Skin: warm and dry, no rash Neuro:  Strength and sensation are intact Psych: euthymic mood, full affect    EKG:  EKG is ordered today. The ekg ordered today demonstrates sinus rhythm with PACs and nonspecific T wave changes.   Recent Labs: 05/27/2017: B Natriuretic Peptide  221.6 02/11/2018: ALT 24; BUN 40; Creatinine, Ser 1.02; Hemoglobin 11.9; Platelets 233.0; Potassium 3.5; Sodium 137    Lipid Panel    Component Value Date/Time   CHOL 132 12/04/2016 0808   TRIG 128.0 12/04/2016 0808   HDL 38.30 (L) 12/04/2016 0808   CHOLHDL 3 12/04/2016 0808   VLDL 25.6 12/04/2016 0808   LDLCALC 68 12/04/2016 0808   LDLDIRECT 92.0 07/06/2016 1059      Wt Readings from Last 3 Encounters:  03/21/18 140 lb 8 oz (63.7 kg)  12/19/17 133 lb 8 oz (60.6 kg)  12/05/17 134 lb 12.8 oz (61.1 kg)       No flowsheet data found.    ASSESSMENT AND PLAN:  1. Coronary artery disease involving native coronary arteries with angina: She reports no symptoms at present time and she is on good antianginal therapy including M. Doerr and carvedilol.  2. Mild to moderate aortic valve stenosis: This has been stable on echocardiogram and her heart normal is also unchanged.  The plan is to repeat echocardiogram in July 2020.  3. Essential hypertension: Blood pressure is now well controlled on current medications.  4. Hyperlipidemia: Continue treatment with atorvastatin. Most recent lipid profile showed an LDL of 68.  5.  Pulmonary hypertension: Likely related to chronic diastolic heart failure.  Systolic pulmonary pressure improved significantly after controlling her blood pressure.  6.  Chronic venous insufficiency: She has significant bilateral leg edema with no other evidence of volume overload.  I am hesitant to put her on any furosemide given that she is on hydrochlorothiazide.  I advised her to start using knee-high support stockings during the day.    Disposition:   FU  in 4 months.  Signed,  Maureen Sacramento, MD  03/21/2018 11:52 AM    [place]

## 2018-03-21 NOTE — Patient Instructions (Signed)
Medication Instructions:  No changes If you need a refill on your cardiac medications before your next appointment, please call your pharmacy.   Lab work: None ordered  Testing/Procedures: None ordered  Follow-Up: At [facility], you and your health needs are our priority.  As part of our continuing mission to provide you with exceptional heart care, we have created designated Provider Care Teams.  These Care Teams include your primary Cardiologist (physician) and Advanced Practice Providers (APPs -  Physician Assistants and Nurse Practitioners) who all work together to provide you with the care you need, when you need it. You will need a follow up appointment in 4 months.You may see [name], MD or one of the following Advanced Practice Providers on your designated Care Team:   Murray Hodgkins, NP Christell Faith, PA-C . Marrianne Mood, PA-C

## 2018-03-26 ENCOUNTER — Other Ambulatory Visit: Payer: Self-pay | Admitting: Internal Medicine

## 2018-03-26 ENCOUNTER — Telehealth: Payer: Self-pay | Admitting: Internal Medicine

## 2018-03-26 MED ORDER — GLUCOSE BLOOD VI STRP: ORAL_STRIP | 5 refills | Status: DC

## 2018-03-26 NOTE — Telephone Encounter (Signed)
Pt needs a refill on her glucose blood (BAYER CONTOUR TEST) test strip.

## 2018-03-26 NOTE — Telephone Encounter (Signed)
Test strips have [facility]

## 2018-04-09 ENCOUNTER — Other Ambulatory Visit: Payer: Self-pay | Admitting: Cardiovascular Disease

## 2018-04-17 IMAGING — CR DG SHOULDER 2+V*R*
2 series · 2 of 2 positions shown · non-contrast
Comparison: None.

CLINICAL DATA: Fall.  Right shoulder pain.  Initial encounter.

EXAM:
RIGHT SHOULDER - 2+ VIEW

[shoulder y view]
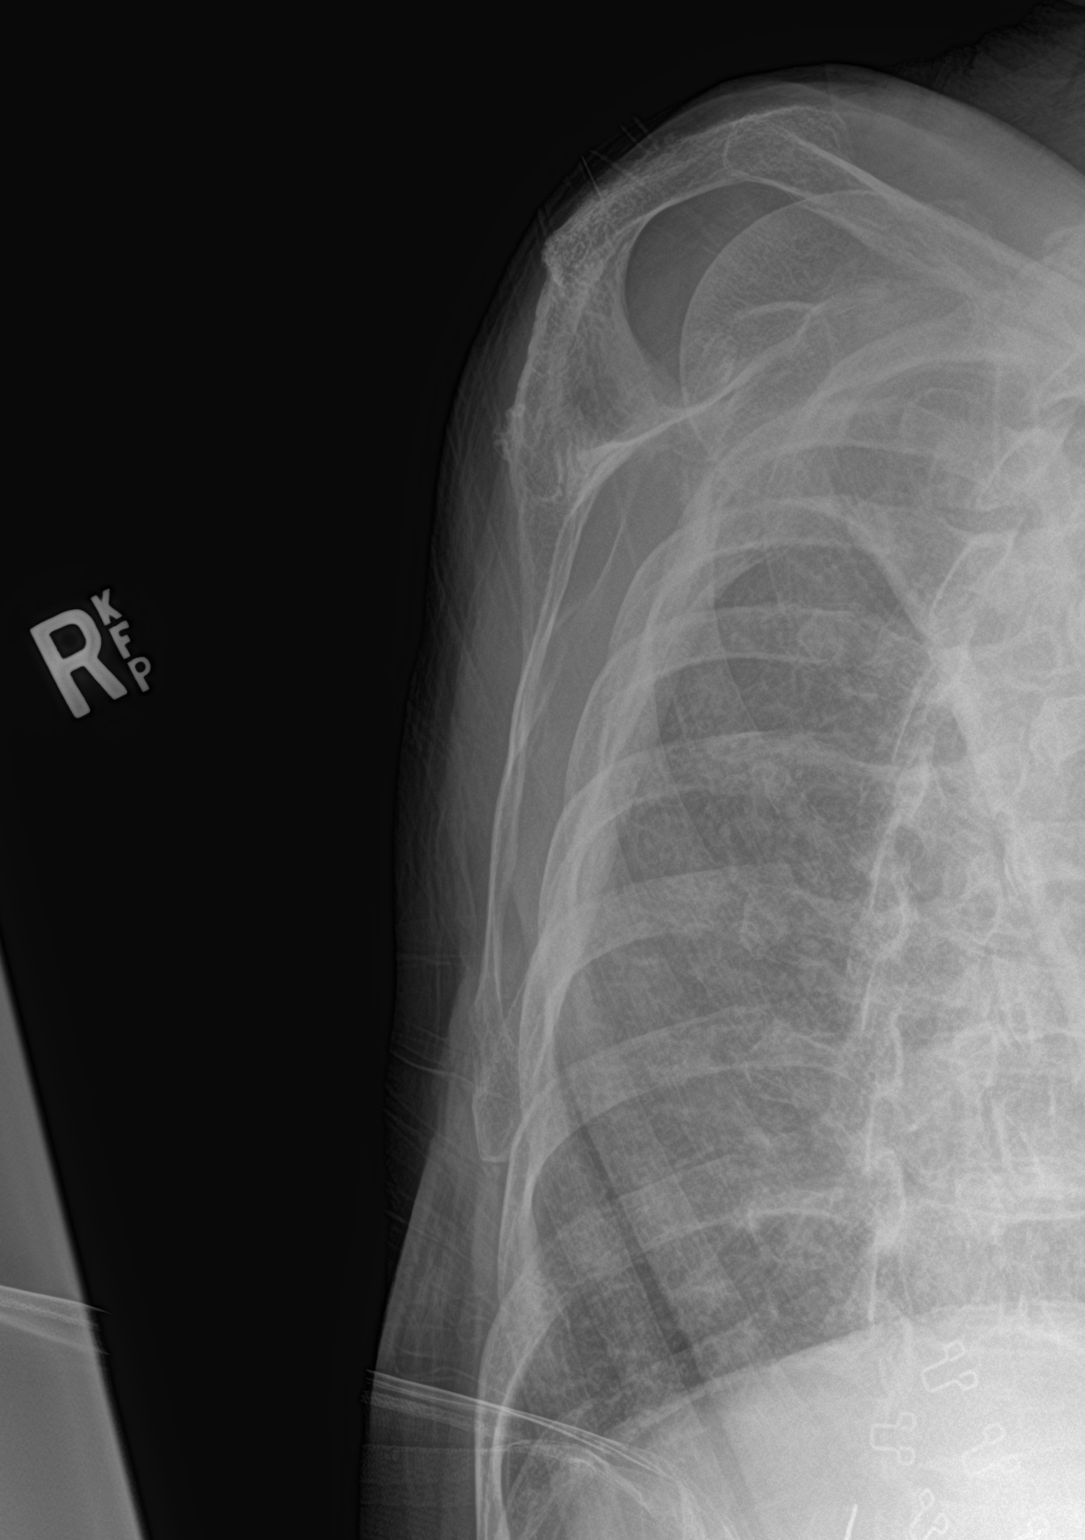

[shoulder ap neutral]
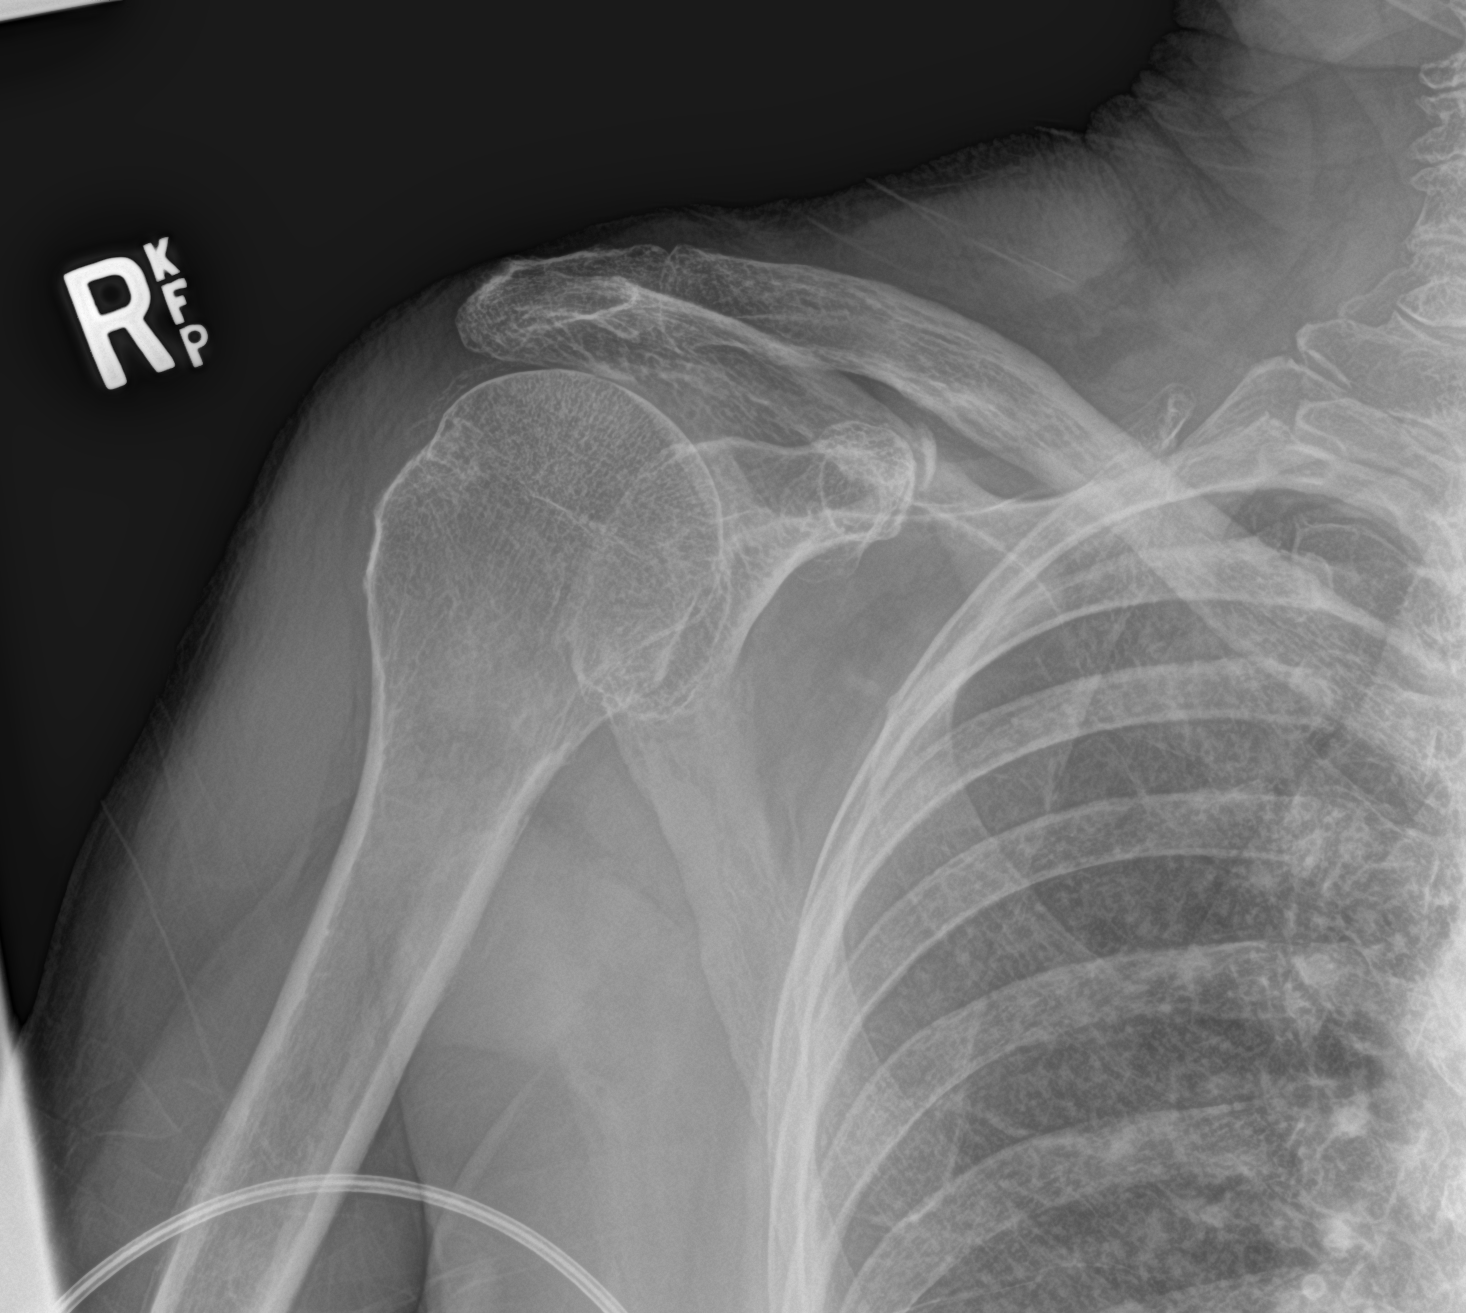

[2 of 2 positions shown; findings below may reference images not displayed]

FINDINGS: There is no evidence of acute fracture or dislocation. Generalized
osteopenia noted. Soft tissues are unremarkable.
IMPRESSION: No acute findings.

## 2018-04-18 ENCOUNTER — Other Ambulatory Visit: Payer: Self-pay | Admitting: Internal Medicine

## 2018-04-18 IMAGING — RF DG HIP (WITH PELVIS) OPERATIVE*R*
1 series · 6 of 6 positions shown · non-contrast
Comparison: 05/26/2017

CLINICAL DATA: Female with a history of fracture

EXAM:
OPERATIVE RIGHT HIP (WITH PELVIS IF PERFORMED)  VIEWS
TECHNIQUE: Fluoroscopic spot image(s) were submitted for interpretation
post-operatively.

[Series 1: run · 6 of 6 slices shown]
[im 1/6]
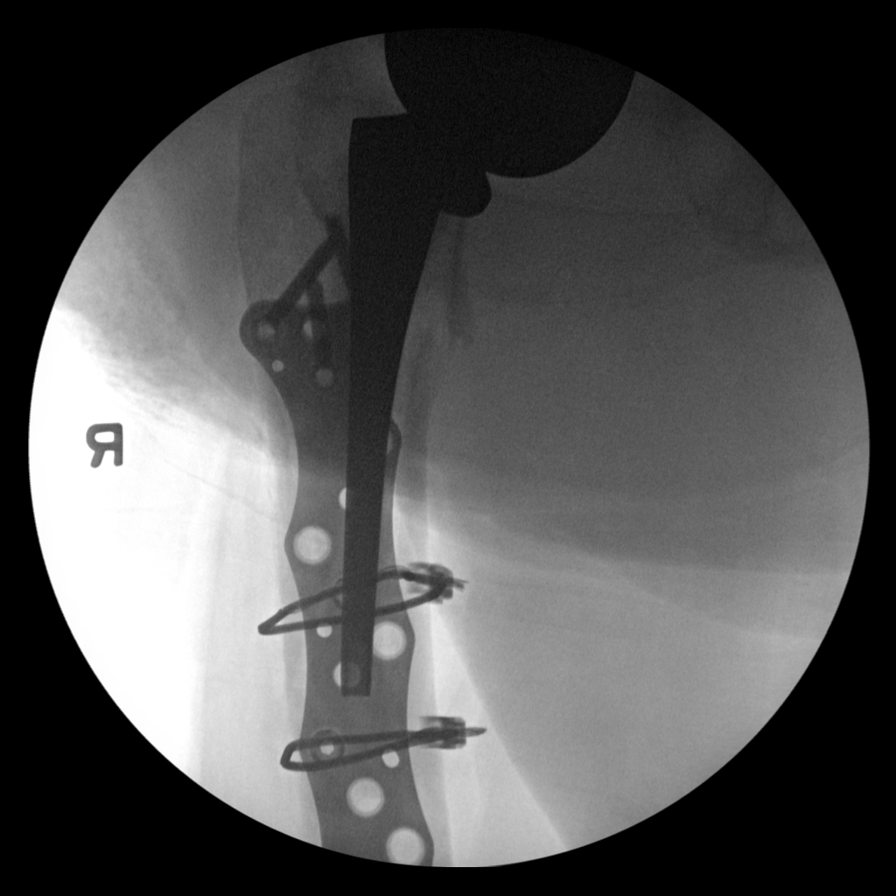
[im 2/6]
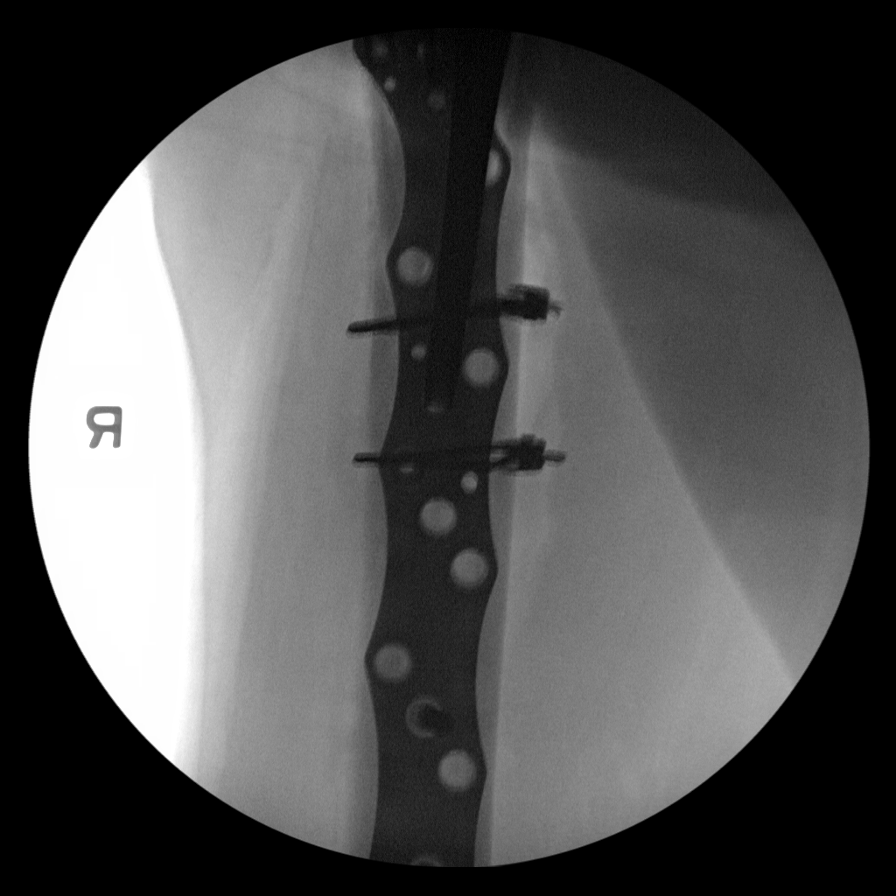
[im 3/6]
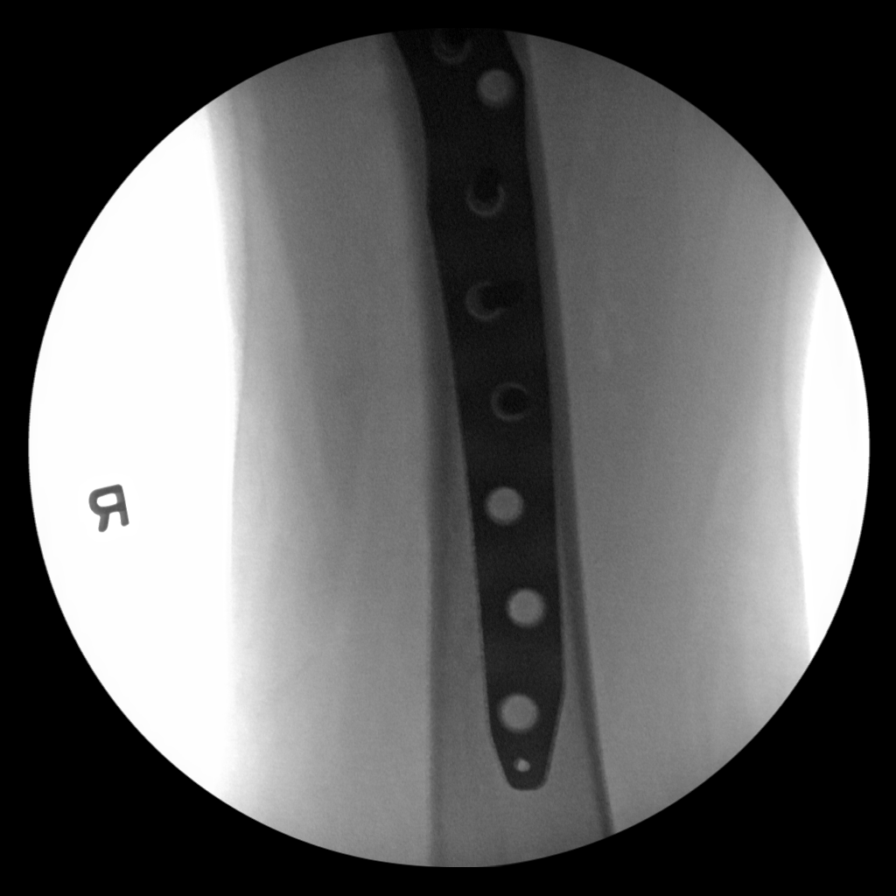
[im 4/6]
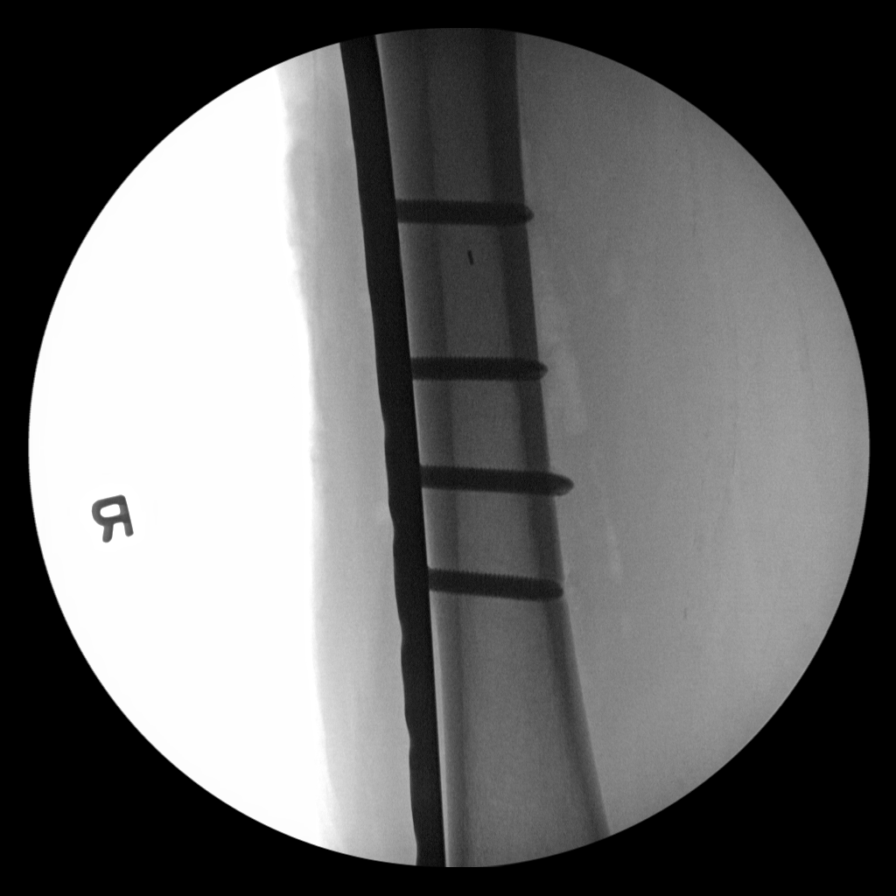
[im 5/6]
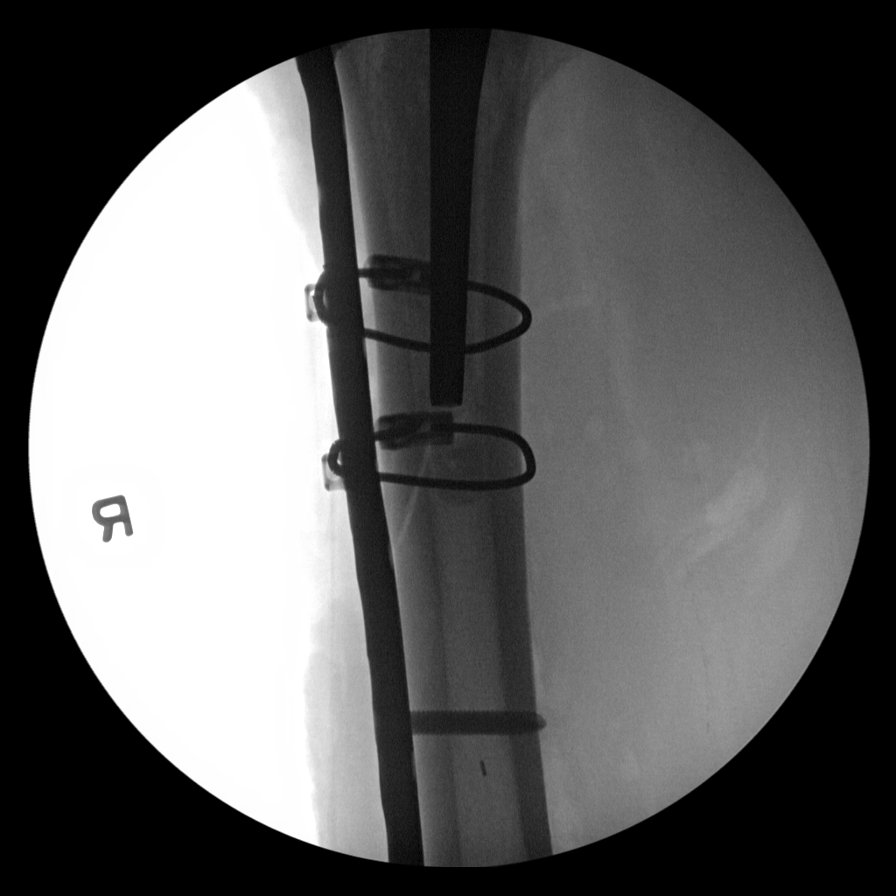
[im 6/6]
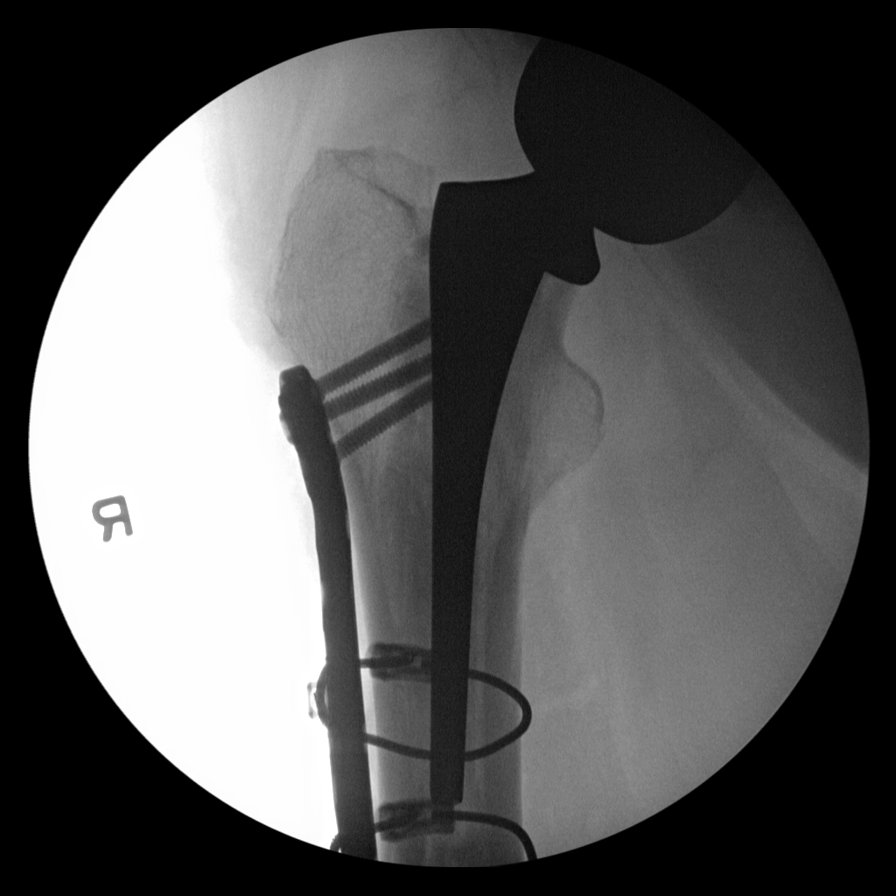

[6 of 6 positions shown; findings below may reference images not displayed]

FINDINGS: Limited intraoperative fluoroscopic spot images of open reduction
internal fixation of perihardware fracture.

Images demonstrate changes of buttress plate and screw fixation with
cerclage wires for repair of perihardware fracture of the proximal
right femur. No immediate complicating features. Surgical changes of
right hip arthroplasty.
IMPRESSION: Intraoperative fluoroscopic spot images demonstrating ORIF for
repair of perihardware fracture at right femur, as above. Please
refer to the dictated operative report for full details of
intraoperative findings and procedure.

## 2018-04-29 ENCOUNTER — Encounter (INDEPENDENT_AMBULATORY_CARE_PROVIDER_SITE_OTHER): Payer: Self-pay | Admitting: Orthopaedic Surgery

## 2018-04-29 ENCOUNTER — Ambulatory Visit (INDEPENDENT_AMBULATORY_CARE_PROVIDER_SITE_OTHER): Payer: Medicare Other | Admitting: Orthopaedic Surgery

## 2018-04-29 ENCOUNTER — Ambulatory Visit (INDEPENDENT_AMBULATORY_CARE_PROVIDER_SITE_OTHER): Payer: Self-pay

## 2018-04-29 ENCOUNTER — Ambulatory Visit (INDEPENDENT_AMBULATORY_CARE_PROVIDER_SITE_OTHER): Payer: Medicare Other

## 2018-04-29 DIAGNOSIS — M546 Pain in thoracic spine: Secondary | ICD-10-CM | POA: Diagnosis not present

## 2018-04-29 DIAGNOSIS — M9701XD Periprosthetic fracture around internal prosthetic right hip joint, subsequent encounter: Secondary | ICD-10-CM

## 2018-04-29 DIAGNOSIS — M5442 Lumbago with sciatica, left side: Secondary | ICD-10-CM

## 2018-04-29 DIAGNOSIS — I2510 Atherosclerotic heart disease of native coronary artery without angina pectoris: Secondary | ICD-10-CM | POA: Diagnosis not present

## 2018-04-29 HISTORY — DX: Lumbago with sciatica, left side: M54.42

## 2018-04-29 NOTE — Progress Notes (Signed)
Office Visit Note   Patient: Maureen Morales           Date of Birth: [birth year]           MRN: [id number] Visit Date: 04/29/2018              Requested by: [name], MD [address] [place], [place] [address] PCP: [name], MD   Assessment & Plan: Visit Diagnoses:  1. Thoracic back pain, unspecified back pain laterality, unspecified chronicity   2. Periprosthetic fracture around internal prosthetic right hip joint, subsequent encounter   3. Low back pain with left-sided sciatica, unspecified back pain laterality, unspecified chronicity     Plan: The x-rays of her proximal femur showed the periprosthetic fracture is healed completely.  At this point I would not recommend any other surgical treatment.  She understands that she will likely continue to have low back pain and sciatica due to severe degenerative disc disease.  She can try to work on core strengthening.  If her back problems become significant enough and her sciatica worsen she will let us know because we would obtain an MRI of her lumbar spine.  All question concerns were answered and addressed.  Follow-up will otherwise be as needed.  Follow-Up Instructions: Return if symptoms worsen or fail to improve.   Orders:  Orders Placed This Encounter  Procedures  . XR FEMUR, MIN 2 VIEWS RIGHT  . XR Lumbar Spine 2-3 Views   No orders of the defined types were placed in this encounter.     Procedures: No procedures performed   Clinical Data: No additional findings.   Subjective: Chief Complaint  Patient presents with  . Right Leg - Follow-up  The patient is 84 year old who is 11 months status post open reduction and fixation of a right periprosthetic femur fracture.  This was around a right hip hemiarthroplasty.  The hemiarthroplasty was done in [facility] in October of last year after mechanical fall.  She then fell in January of this year fracturing around it.  She does  ambulate using a rolling walker.  She has been having some low back pain to her right side and sciatic region.  She said that she feels like she is slowly getting better.  She still reports thigh pain on the right operative side.  HPI  Review of Systems She currently denies any headache, chest pain, short of breath, fever, chills, nausea, vomiting.  Objective: Vital Signs: There were no vitals taken for this visit.  Physical Exam She is alert and orient x3 and in no acute distress Ortho Exam On examination she is able to get up and out of her chair but does so slowly.  She ambulates with a walker.  She lets me put her right hip and thigh at the range of motion with just slight pain.  She has pain to palpation in the posterior pelvis area near the SI joint and sciatic region with low back pain to the right side. Specialty Comments:  No specialty comments available.  Imaging: Xr Femur, Min 2 Views Right  Result Date: 04/29/2018 2 views of the right femur show a healed proximal femur fracture status post plating of a periprosthetic fracture around a hip replacement.  Xr Lumbar Spine 2-3 Views  Result Date: 04/29/2018 2 views of the lumbar spine showed no acute findings.  There is no evidence of compression fracture.  There is degenerative disc disease and spondylosis at several  levels.    PMFS History: Patient Active Problem List   Diagnosis Date Noted  . Low back pain with left-sided sciatica 04/29/2018  . Severe tricuspid regurgitation 09/03/2017  . Mitral and aortic valve regurgitation 09/03/2017  . Diastolic dysfunction without heart failure 09/03/2017  . Hypertensive crisis   . Labile blood pressure   . Hypokalemia   . Hypoalbuminemia due to protein-calorie malnutrition ([place])   . Intermittent asthma without complication   . Stage 3 chronic kidney disease ([place])   . Chronic diastolic congestive heart failure ([place])   . Periprosthetic fracture around internal prosthetic  right hip joint ([place]) 05/30/2017  . Decubitus ulcer of right heel, stage 3 ([place])   . Anal fistula   . History of right hip hemiarthroplasty   . Benign essential HTN   . Acute blood loss anemia   . Diabetes mellitus type 2 in nonobese ([facility])   . AKI (acute kidney injury) ([place])   . Post-operative pain   . Fall 05/26/2017  . Closed right hip fracture ([place]) 05/26/2017  . Normocytic anemia 05/26/2017  . Abdominal distention 05/26/2017  . CAD (coronary artery disease) 05/26/2017  . Depression   . HLD (hyperlipidemia)   . Essential hypertension   . 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 Closed displaced fracture of right femoral neck ([place]) 02/16/2017  . Abnormal CT scan, colon   . Generalized anxiety disorder 09/07/2016  . Unsteady gait 09/07/2016  . NSTEMI (non-ST elevated myocardial infarction) ([place]) 08/30/2016  . Pain in joint, ankle and foot 06/24/2014  . S/P laparoscopic cholecystectomy 12/15/2013  . Preoperative general physical examination 12/15/2013  . Encounter for preventive health examination 11/13/2013  . Cervical spine disease 01/27/2013  . Anemia, iron deficiency 01/21/2013  . Right medial knee pain 01/09/2013  . Back pain, thoracic 08/27/2012  . Moderate aortic stenosis by prior echocardiogram 05/06/2012  . Asthma 07/04/2011  . Hemorrhoids, internal 04/05/2011  . Nummular eczema 01/16/2011  . Essential hypertension   . Type II diabetes mellitus with nephropathy ([place])   . Hyperlipidemia    Past Medical History:  Diagnosis Date  . Aortic stenosis    a. 08/2016 Echo: Hyperdynamic LV fxn, mild AS (mean grad 1mmHg), mild MR, mild PAH; b. 05/2017 Echo: Hyperdynamic LV fxn, mod AS (mean grad 66mmHg, valve area 1.84). PASP 157mmHg.  . Asthma without status asthmaticus    unspecified  . Colitis    unspecified  . Complication of anesthesia    sensitive to anesthesia  . Depression   . Diabetes mellitus type 2, uncomplicated  ([place])   . Essential hypertension   . GERD (gastroesophageal reflux disease)   . 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 Hyperlipidemia    unspecified  . Hypertension    a. 08/2017 Renal U/S: No RAS.  [name] Myocardial infarction ([place]) 2018  . Non-obstructive CAD (coronary artery disease)    a. 08/2016 NSTEMI/Cath: mild to mod nonobs dzs including 60% mRCA stenosis-->Med rx.  . Peripheral vascular disease ([place])   . Positive H. pylori test   . Viral gastroenteritis due to [place]-like agent Nov 2012    Family History  Problem Relation Age of Onset  . Breast cancer Mother 37  . Colon cancer Father   . Stroke Sister   . Cancer Mother   . Breast cancer Sister 73  . Colon cancer Brother   . Colon cancer Brother     Past Surgical History:  Procedure Laterality Date  . ABDOMINAL HYSTERECTOMY    . BLADDER SURGERY    . CARDIAC CATHETERIZATION    . CHOLECYSTECTOMY    . COLONOSCOPY     06/16/1987, 02/20/1995, 05/04/1999, 02/14/2005, 05/17/2007  . ESOPHAGOGASTRODUODENOSCOPY     05/11/1987, 06/13/1995, 10/09/1995, 05/04/1999, 01/16/2002  . FLEXIBLE SIGMOIDOSCOPY N/A 05/02/2017   Procedure: FLEXIBLE SIGMOIDOSCOPY;  Surgeon: Manya Silvas, MD;  Location: [facility] ENDOSCOPY;  Service: Endoscopy;  Laterality: N/A;  . HEMORRHOID SURGERY N/A 05/14/2017   Procedure: EXTERNAL THROMBOSED HEMORRHOIDECTOMY;  Surgeon: Robert Bellow, MD;  Location: [facility] ORS;  Service: General;  Laterality: N/A;  . HIP ARTHROPLASTY Right 02/17/2017   Procedure: ARTHROPLASTY BIPOLAR HIP (HEMIARTHROPLASTY);  Surgeon: Claud Kelp, MD;  Location: [facility] ORS;  Service: Orthopedics;  Laterality: Right;  . LEFT HEART CATH AND CORONARY ANGIOGRAPHY N/A 08/31/2016   Procedure: Left Heart Cath and Coronary Angiography;  Surgeon: Wellington Hampshire, MD;  Location: [place] CV LAB;  Service: Cardiovascular;  Laterality: N/A;  . ORIF PERIPROSTHETIC FRACTURE Right 05/27/2017  . ORIF PERIPROSTHETIC FRACTURE Right 05/27/2017    Procedure: OPEN REDUCTION INTERNAL FIXATION (ORIF) PERIPROSTHETIC FRACTURE;  Surgeon: Mcarthur Rossetti, MD;  Location: [place];  Service: Orthopedics;  Laterality: Right;  . TOTAL HIP ARTHROPLASTY Right   . Theadora Rama prolapse     Social History   Occupational History  . Not on file  Tobacco Use  . Smoking status: Never Smoker  . Smokeless tobacco: Never Used  Substance and Sexual Activity  . Alcohol use: No    Frequency: Never  . Drug use: No  . Sexual activity: Never

## 2018-05-07 ENCOUNTER — Other Ambulatory Visit (INDEPENDENT_AMBULATORY_CARE_PROVIDER_SITE_OTHER): Payer: Medicare Other

## 2018-05-07 ENCOUNTER — Telehealth: Payer: Self-pay

## 2018-05-07 DIAGNOSIS — R300 Dysuria: Secondary | ICD-10-CM

## 2018-05-07 DIAGNOSIS — N3000 Acute cystitis without hematuria: Secondary | ICD-10-CM

## 2018-05-07 LAB — POCT URINALYSIS DIP (MANUAL ENTRY)
BILIRUBIN UA: NEGATIVE
Glucose, UA: NEGATIVE mg/dL
Ketones, POC UA: NEGATIVE mg/dL
Nitrite, UA: POSITIVE — AB
PH UA: 5.5 (ref 5.0–8.0)
Spec Grav, UA: 1.01 (ref 1.010–1.025)
Urobilinogen, UA: 0.2 E.U./dL

## 2018-05-07 NOTE — Telephone Encounter (Signed)
Orders have been placed per Dr. Derrel Nip.

## 2018-05-08 LAB — URINALYSIS, MICROSCOPIC ONLY

## 2018-05-08 MED ORDER — CEFDINIR 300 MG PO CAPS: 300.0000 mg | ORAL_CAPSULE | Freq: Two times a day (BID) | ORAL | 0 refills | Status: DC

## 2018-05-08 NOTE — Progress Notes (Signed)
Lab Results  Component Value Date   HGBA1C 7.7 (H) 02/11/2018

## 2018-05-08 NOTE — Assessment & Plan Note (Signed)
Symptoms of dysuria.  Positive UA with micro .  Given age will treat while waiting for culture.  [facility]iric [facility]ir [facility]

## 2018-05-08 NOTE — Addendum Note (Signed)
Addended by: [name] on: 05/08/2018 01:01 PM   Modules accepted: Orders

## 2018-05-09 LAB — URINE CULTURE
MICRO NUMBER:: 21916
SPECIMEN QUALITY:: ADEQUATE

## 2018-05-12 ENCOUNTER — Other Ambulatory Visit: Payer: Self-pay | Admitting: Internal Medicine

## 2018-05-12 DIAGNOSIS — R300 Dysuria: Secondary | ICD-10-CM

## 2018-05-12 MED ORDER — CIPROFLOXACIN HCL 250 MG PO TABS: 250.0000 mg | ORAL_TABLET | Freq: Two times a day (BID) | ORAL | 0 refills | Status: DC

## 2018-05-12 NOTE — Progress Notes (Signed)
cipro

## 2018-05-13 ENCOUNTER — Other Ambulatory Visit (INDEPENDENT_AMBULATORY_CARE_PROVIDER_SITE_OTHER): Payer: Medicare Other

## 2018-05-13 ENCOUNTER — Other Ambulatory Visit: Payer: Self-pay | Admitting: Internal Medicine

## 2018-05-13 DIAGNOSIS — R300 Dysuria: Secondary | ICD-10-CM

## 2018-05-13 LAB — URINALYSIS, ROUTINE W REFLEX MICROSCOPIC
Bilirubin Urine: NEGATIVE
Hgb urine dipstick: NEGATIVE
Ketones, ur: NEGATIVE
Nitrite: NEGATIVE
RBC / HPF: NONE SEEN (ref 0–?)
Specific Gravity, Urine: 1.015 (ref 1.000–1.030)
Total Protein, Urine: NEGATIVE
Urine Glucose: NEGATIVE
Urobilinogen, UA: 0.2 (ref 0.0–1.0)
pH: 5.5 (ref 5.0–8.0)

## 2018-05-14 LAB — URINE CULTURE
MICRO NUMBER:: 46353
SPECIMEN QUALITY: ADEQUATE

## 2018-05-21 DIAGNOSIS — H903 Sensorineural hearing loss, bilateral: Secondary | ICD-10-CM | POA: Diagnosis not present

## 2018-05-21 DIAGNOSIS — H6123 Impacted cerumen, bilateral: Secondary | ICD-10-CM | POA: Diagnosis not present

## 2018-05-23 ENCOUNTER — Other Ambulatory Visit: Payer: Self-pay | Admitting: Internal Medicine

## 2018-05-29 DIAGNOSIS — D171 Benign lipomatous neoplasm of skin and subcutaneous tissue of trunk: Secondary | ICD-10-CM | POA: Diagnosis not present

## 2018-05-29 DIAGNOSIS — D485 Neoplasm of uncertain behavior of skin: Secondary | ICD-10-CM | POA: Diagnosis not present

## 2018-06-05 ENCOUNTER — Other Ambulatory Visit: Payer: Self-pay | Admitting: Internal Medicine

## 2018-06-05 ENCOUNTER — Encounter: Payer: Medicare Other | Admitting: Physical Medicine & Rehabilitation

## 2018-06-05 ENCOUNTER — Other Ambulatory Visit (INDEPENDENT_AMBULATORY_CARE_PROVIDER_SITE_OTHER): Payer: Medicare Other

## 2018-06-05 DIAGNOSIS — R358 Other polyuria: Secondary | ICD-10-CM | POA: Diagnosis not present

## 2018-06-05 DIAGNOSIS — R350 Frequency of micturition: Secondary | ICD-10-CM | POA: Diagnosis not present

## 2018-06-05 DIAGNOSIS — R3589 Other polyuria: Secondary | ICD-10-CM

## 2018-06-05 LAB — URINALYSIS, MICROSCOPIC ONLY

## 2018-06-05 LAB — POCT URINALYSIS DIPSTICK
Bilirubin, UA: NEGATIVE
Glucose, UA: NEGATIVE
Ketones, UA: NEGATIVE
Nitrite, UA: POSITIVE
PH UA: 6.5 (ref 5.0–8.0)
Protein, UA: POSITIVE — AB
Spec Grav, UA: 1.02 (ref 1.010–1.025)
Urobilinogen, UA: 0.2 E.U./dL

## 2018-06-07 LAB — URINE CULTURE
MICRO NUMBER:: 154852
SPECIMEN QUALITY:: ADEQUATE

## 2018-06-13 ENCOUNTER — Encounter: Payer: Self-pay | Admitting: Physical Medicine & Rehabilitation

## 2018-06-13 ENCOUNTER — Encounter: Payer: Medicare Other | Attending: Physical Medicine & Rehabilitation | Admitting: Physical Medicine & Rehabilitation

## 2018-06-13 VITALS — BP 147/75 | HR 74 | Resp 14 | Ht 60.0 in | Wt 140.0 lb

## 2018-06-13 DIAGNOSIS — R600 Localized edema: Secondary | ICD-10-CM

## 2018-06-13 DIAGNOSIS — I350 Nonrheumatic aortic (valve) stenosis: Secondary | ICD-10-CM | POA: Diagnosis not present

## 2018-06-13 DIAGNOSIS — E11621 Type 2 diabetes mellitus with foot ulcer: Secondary | ICD-10-CM | POA: Diagnosis not present

## 2018-06-13 DIAGNOSIS — I252 Old myocardial infarction: Secondary | ICD-10-CM | POA: Diagnosis not present

## 2018-06-13 DIAGNOSIS — E1151 Type 2 diabetes mellitus with diabetic peripheral angiopathy without gangrene: Secondary | ICD-10-CM | POA: Diagnosis not present

## 2018-06-13 DIAGNOSIS — I169 Hypertensive crisis, unspecified: Secondary | ICD-10-CM

## 2018-06-13 DIAGNOSIS — Z823 Family history of stroke: Secondary | ICD-10-CM | POA: Insufficient documentation

## 2018-06-13 DIAGNOSIS — G8918 Other acute postprocedural pain: Secondary | ICD-10-CM | POA: Diagnosis not present

## 2018-06-13 DIAGNOSIS — K219 Gastro-esophageal reflux disease without esophagitis: Secondary | ICD-10-CM | POA: Diagnosis not present

## 2018-06-13 DIAGNOSIS — M9701XS Periprosthetic fracture around internal prosthetic right hip joint, sequela: Secondary | ICD-10-CM | POA: Diagnosis not present

## 2018-06-13 DIAGNOSIS — E785 Hyperlipidemia, unspecified: Secondary | ICD-10-CM | POA: Insufficient documentation

## 2018-06-13 DIAGNOSIS — I10 Essential (primary) hypertension: Secondary | ICD-10-CM | POA: Insufficient documentation

## 2018-06-13 DIAGNOSIS — L97419 Non-pressure chronic ulcer of right heel and midfoot with unspecified severity: Secondary | ICD-10-CM | POA: Diagnosis not present

## 2018-06-13 DIAGNOSIS — R262 Difficulty in walking, not elsewhere classified: Secondary | ICD-10-CM | POA: Insufficient documentation

## 2018-06-13 DIAGNOSIS — R269 Unspecified abnormalities of gait and mobility: Secondary | ICD-10-CM

## 2018-06-13 DIAGNOSIS — I2510 Atherosclerotic heart disease of native coronary artery without angina pectoris: Secondary | ICD-10-CM | POA: Diagnosis not present

## 2018-06-13 DIAGNOSIS — S72001A Fracture of unspecified part of neck of right femur, initial encounter for closed fracture: Secondary | ICD-10-CM

## 2018-06-13 NOTE — Patient Instructions (Signed)
Please trial Lidocaine patch

## 2018-06-13 NOTE — Progress Notes (Addendum)
Subjective:    Patient ID: Maureen Morales, female    DOB: January 21, 1923, 85 y.o.   MRN: [id number]  HPI Female with history of CAD, HTN, right hip hemiarthroplasty (hip precautions) presents for follow up for for periprosthetic femur fracture and scalp hematoma.  Last clinic visit 12/05/17.   History supplemented by history. Since last visit, she states she has not been doing HEP. Pain is relatively controlled. She complains of edema in RLE.  Denies falls. BP is elevated.  Pain Inventory Average Pain 5 Pain Right Now 5 My pain is stabbing  In the last 24 hours, has pain interfered with the following? General activity 6 Relation with others 10 Enjoyment of life 10 What TIME of day is your pain at its worst? daytime Sleep (in general) Good  Pain is worse with: sitting Pain improves with: medication Relief from Meds: no selection  Mobility walk with assistance use a walker ability to climb steps?  no do you drive?  no use a wheelchair  Function retired Do you have any goals in this area?  no  Neuro/Psych trouble walking  Prior Studies Any changes since last visit?  no  Physicians involved in your care Any changes since last visit?  no   Family History  Problem Relation Age of Onset  . Breast cancer Mother 66  . Colon cancer Father   . Stroke Sister   . Cancer Mother   . Breast cancer Sister 105  . Colon cancer Brother   . Colon cancer Brother    Social History   Socioeconomic History  . Marital status: Married    Spouse name: [name]  . Number of children: 4  . Years of education: 67  . Highest education level: High school graduate  Occupational History  . Not on file  Social Needs  . Financial resource strain: Not on file  . Food insecurity:    Worry: Not on file    Inability: Not on file  . Transportation needs:    Medical: Not on file    Non-medical: Not on file  Tobacco Use  . Smoking status: Never Smoker  . Smokeless tobacco: Never Used    Substance and Sexual Activity  . Alcohol use: No    Frequency: Never  . Drug use: No  . Sexual activity: Never  Lifestyle  . Physical activity:    Days per week: Not on file    Minutes per session: Not on file  . Stress: Not on file  Relationships  . Social connections:    Talks on phone: Not on file    Gets together: Not on file    Attends religious service: Not on file    Active member of club or organization: Not on file    Attends meetings of clubs or organizations: Not on file    Relationship status: Not on file  Other Topics Concern  . Not on file  Social History Narrative   ** Merged History Encounter **       Married 4 children Never smoker Denies alcohol use Full Code   Past Surgical History:  Procedure Laterality Date  . ABDOMINAL HYSTERECTOMY    . BLADDER SURGERY    . CARDIAC CATHETERIZATION    . CHOLECYSTECTOMY    . COLONOSCOPY     06/16/1987, 02/20/1995, 05/04/1999, 02/14/2005, 05/17/2007  . ESOPHAGOGASTRODUODENOSCOPY     05/11/1987, 06/13/1995, 10/09/1995, 05/04/1999, 01/16/2002  . FLEXIBLE SIGMOIDOSCOPY N/A 05/02/2017   Procedure: FLEXIBLE SIGMOIDOSCOPY;  Surgeon: Vira Agar,  Gavin Pound, MD;  Location: [facility];  Service: Endoscopy;  Laterality: N/A;  . HEMORRHOID SURGERY N/A 05/14/2017   Procedure: EXTERNAL THROMBOSED HEMORRHOIDECTOMY;  Surgeon: Robert Bellow, MD;  Location: [facility] ORS;  Service: General;  Laterality: N/A;  . HIP ARTHROPLASTY Right 02/17/2017   Procedure: ARTHROPLASTY BIPOLAR HIP (HEMIARTHROPLASTY);  Surgeon: Claud Kelp, MD;  Location: [facility] ORS;  Service: Orthopedics;  Laterality: Right;  . LEFT HEART CATH AND CORONARY ANGIOGRAPHY N/A 08/31/2016   Procedure: Left Heart Cath and Coronary Angiography;  Surgeon: Wellington Hampshire, MD;  Location: [place] [facility];  Service: Cardiovascular;  Laterality: N/A;  . ORIF PERIPROSTHETIC FRACTURE Right 05/27/2017  . ORIF PERIPROSTHETIC FRACTURE Right 05/27/2017   Procedure: OPEN REDUCTION  INTERNAL FIXATION (ORIF) PERIPROSTHETIC FRACTURE;  Surgeon: Mcarthur Rossetti, MD;  Location: [place];  Service: Orthopedics;  Laterality: Right;  . TOTAL HIP ARTHROPLASTY Right   . Theadora Rama prolapse     Past Medical History:  Diagnosis Date  . Aortic stenosis    a. 08/2016 Echo: Hyperdynamic LV fxn, mild AS (mean grad 26mmHg), mild MR, mild PAH; b. 05/2017 Echo: Hyperdynamic LV fxn, mod AS (mean grad 26mmHg, valve area 1.84). PASP 139mmHg.  . Asthma without status asthmaticus    unspecified  . Colitis    unspecified  . Complication of anesthesia    sensitive to anesthesia  . Depression   . Diabetes mellitus type 2, uncomplicated ([place])   . Essential hypertension   . GERD (gastroesophageal reflux disease)   . 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 Hyperlipidemia    unspecified  . Hypertension    a. 08/2017 Renal U/S: No RAS.  [name] Myocardial infarction ([place]) 2018  . Non-obstructive CAD (coronary artery disease)    a. 08/2016 NSTEMI/Cath: mild to mod nonobs dzs including 60% mRCA stenosis-->Med rx.  . Peripheral vascular disease ([place])   . Positive H. pylori test   . Viral gastroenteritis due to [place]-like agent Nov 2012   BP (!) 186/75   Pulse 74   Resp 14   Ht 5' (1.524 m) Comment: previous  Wt 140 lb (63.5 kg) Comment: previous  SpO2 98%   BMI 27.34 kg/m   Opioid Risk Score:   Fall Risk Score:  `1  Depression screen PHQ 2/9  Depression screen [facility] 2/9 12/05/2017 05/22/2015 07/08/2014  Decreased Interest 0 0 0  Down, Depressed, Hopeless 0 0 0  PHQ - 2 Score 0 0 0  Some recent data might be hidden     Review of Systems  Constitutional: Negative.   HENT: Negative.   Eyes: Negative.   Respiratory: Positive for wheezing.   Cardiovascular: Negative.   Gastrointestinal: Negative.   Endocrine: Negative.        High blood sugar  Genitourinary: Negative.   Musculoskeletal: Positive for arthralgias, gait problem and joint swelling.  Skin: Negative.     Allergic/Immunologic: Negative.   Hematological: Negative.   Psychiatric/Behavioral: Negative.   All other systems reviewed and are negative.     Objective:   Physical Exam Constitutional: She appears well-developed and well-nourished. No distress.  HENT: Normocephalic and atraumatic.  Eyes: EOM are normal. No discharge.  Cardiovascular: RRR. Murmur heard. No JVD. Respiratory: Effort normal. Clear.  GI: Bowel sounds are normal. She exhibits no distension.  Musculoskeletal:  B/l foot edema Neurological: She is alert and oriented.  Motor: B/l UE: 4+/5 proximal to distal RLE: HF 4+/5, ADF/PF 4+/5  LLE: HF, KE 4+/5,  ADF/PF 5/5  Skin: Erythema in right foot Psychiatric: She has a normal mood and affect. Her behavior is normal.     Assessment & Plan:  Female with history of CAD, HTN, right hip hemiarthroplasty (hip precautions) presents for follow up for periprosthetic femur fracture and scalp hematoma.  1.  Deficits with mobility, transfers, and self-care secondary to right peri-prosthetic hip fracture.   Educated on continues HEP, again  2. Pain Management: Mainly in right foot  Controlled at present  Cont ice  Encourage Lidoderm patch, again  Will consider Votaren gel  Will consider TENS unit  Not interested in Vascular referral at present  3. Right heel ulcer  Improving  Cont prevalon boot qhs.    4. Gait abnormality  Cont walker for safety  Cont therapies  5. Right foot edema  U/S neg for DVT  Encouraged elevated, again  Encouraged low salt diet, pt states she is not willing to change her diet, no changes  6. HTN  Hypertensive crisis today, states WNL at home.  Will recheck

## 2018-06-20 ENCOUNTER — Telehealth: Payer: Self-pay | Admitting: Internal Medicine

## 2018-06-20 ENCOUNTER — Other Ambulatory Visit (INDEPENDENT_AMBULATORY_CARE_PROVIDER_SITE_OTHER): Payer: Medicare Other

## 2018-06-20 DIAGNOSIS — R300 Dysuria: Secondary | ICD-10-CM | POA: Diagnosis not present

## 2018-06-20 LAB — POCT URINALYSIS DIP (MANUAL ENTRY)
Bilirubin, UA: NEGATIVE
Glucose, UA: NEGATIVE mg/dL
Ketones, POC UA: NEGATIVE mg/dL
NITRITE UA: NEGATIVE
Protein Ur, POC: NEGATIVE mg/dL
Spec Grav, UA: 1.01 (ref 1.010–1.025)
Urobilinogen, UA: 0.2 E.U./dL
pH, UA: 5.5 (ref 5.0–8.0)

## 2018-06-20 NOTE — Telephone Encounter (Signed)
Advised patient daughter DPR and she voiced understanding.

## 2018-06-20 NOTE — Telephone Encounter (Signed)
Urine culture and micro sent and results pending.  Given her current clinical concerns and urine dip with negative nitrite, etc, I would hold on abx at this time.  Will await culture results.  If any worsening symptoms or problems prior to culture results - let us know.

## 2018-06-20 NOTE — Addendum Note (Signed)
Addended by: Leeanne Rio on: 06/20/2018 03:48 PM   Modules accepted: Orders

## 2018-06-20 NOTE — Telephone Encounter (Signed)
Talked with caregiver , patient is just complaining with incontinence, no dysuria , and no fever or chills, patient caregiver feels patient will feel better and rest better if she can leave urine.  Patient will wait until PCP back in office.

## 2018-06-21 LAB — URINALYSIS, MICROSCOPIC ONLY: Casts: NONE SEEN /lpf

## 2018-06-23 LAB — URINE CULTURE

## 2018-06-24 ENCOUNTER — Other Ambulatory Visit: Payer: Self-pay | Admitting: Internal Medicine

## 2018-06-27 ENCOUNTER — Other Ambulatory Visit: Payer: Self-pay | Admitting: Internal Medicine

## 2018-06-27 DIAGNOSIS — N390 Urinary tract infection, site not specified: Secondary | ICD-10-CM

## 2018-07-01 NOTE — Telephone Encounter (Signed)
See previous message

## 2018-07-03 ENCOUNTER — Other Ambulatory Visit: Payer: Self-pay | Admitting: Internal Medicine

## 2018-07-10 ENCOUNTER — Other Ambulatory Visit: Payer: Self-pay | Admitting: Cardiovascular Disease

## 2018-07-11 ENCOUNTER — Encounter: Payer: Medicare Other | Admitting: Physical Medicine & Rehabilitation

## 2018-07-12 DIAGNOSIS — N390 Urinary tract infection, site not specified: Secondary | ICD-10-CM | POA: Diagnosis not present

## 2018-07-12 DIAGNOSIS — N3941 Urge incontinence: Secondary | ICD-10-CM | POA: Diagnosis not present

## 2018-07-22 ENCOUNTER — Telehealth: Payer: Self-pay | Admitting: *Deleted

## 2018-07-22 NOTE — Telephone Encounter (Signed)
Call placed to the patient as part of the prescreening protocol for her appointment on 07/25/2018. There was no answer nor voicemail available.

## 2018-07-24 NOTE — Telephone Encounter (Signed)
Spoke with patients daughter per release form to review if she would be available to do evisit and/or video visit. She was agreeable to either type and requested that we please call on her number. [phone] to speak with patient and Maureen Morales. She did provide consent as listed below.     YOUR CARDIOLOGY TEAM HAS ARRANGED FOR AN E-VISIT FOR YOUR APPOINTMENT - PLEASE REVIEW IMPORTANT INFORMATION BELOW SEVERAL DAYS PRIOR TO YOUR APPOINTMENT  Due to the recent COVID-19 pandemic, we are transitioning in-person office visits to tele-medicine visits in an effort to decrease unnecessary exposure to our patients and staff. Medicare and most insurances are covering these visits without a copay needed. You will need a working email and a smartphone or computer with a camera and microphone. For patients that do not have these items, we can still complete the visit using a telephone but do prefer video when possible. If possible, we also ask that you have a blood pressure cuff and scale at home to measure your blood pressure, heart rate and weight prior to your scheduled appointment. Patients with clinical needs that need an in-person evaluation and testing will still be able to come to the office if absolutely necessary. If you have any questions, feel free to call our office.      2-3 DAYS BEFORE YOUR APPOINTMENT  One of our staff will call you to confirm that you have been able to set up your WebEx account. We will remind you check your blood pressure, heart rate and weight prior to your scheduled appointment. If you have an Apple Watch or Kardia, please upload any pertinent ECG strips the day before or morning of your appointment to [place]. Our staff will also make sure you have reviewed the consent and agree to move forward with your scheduled tele-health visit.    THE DAY OF YOUR APPOINTMENT  Approximately 15-20 minutes prior to your scheduled appointment, you will receive an e-mail directly from  one of our staff member's [email or web address] e-mail accounts inviting you to join a WebEx meeting.  Please do not reply to that email - simply join the [employer].  Upon joining, a member of the office staff will speak with you initially through the WebEx platform to confirm medications, vital signs for the day and any symptoms you may be experiencing.  Please have this information available prior to the time of visit start.      CONSENT FOR TELE-HEALTH VISIT - PLEASE RVIEW  I hereby voluntarily request, consent and authorize [facility] and its employed or contracted physicians, physician assistants, nurse practitioners or other licensed health care professionals (the Practitioner), to provide me with telemedicine health care services (the "Services") as deemed necessary by the treating Practitioner. I acknowledge and consent to receive the Services by the Practitioner via telemedicine. I understand that the telemedicine visit will involve communicating with the Practitioner through live audiovisual communication technology and the disclosure of certain medical information by electronic transmission. I acknowledge that I have been given the opportunity to request an in-person assessment or other available alternative prior to the telemedicine visit and am voluntarily participating in the telemedicine visit.  I understand that I have the right to withhold or withdraw my consent to the use of telemedicine in the course of my care at any time, without affecting my right to future care or treatment, and that the Practitioner or I may terminate the telemedicine visit at any time. I understand that I have the right to  inspect all information obtained and/or recorded in the course of the telemedicine visit and may receive copies of available information for a reasonable fee.  I understand that some of the potential risks of receiving the Services via telemedicine include:  [name] Delay or interruption in medical  evaluation due to technological equipment failure or disruption; . Information transmitted may not be sufficient (e.g. poor resolution of images) to allow for appropriate medical decision making by the Practitioner; and/or  . In rare instances, security protocols could fail, causing a breach of personal health information.  Furthermore, I acknowledge that it is my responsibility to provide information about my medical history, conditions and care that is complete and accurate to the best of my ability. I acknowledge that Practitioner's advice, recommendations, and/or decision may be based on factors not within their control, such as incomplete or inaccurate data provided by me or distortions of diagnostic images or specimens that may result from electronic transmissions. I understand that the practice of medicine is not an exact science and that Practitioner makes no warranties or guarantees regarding treatment outcomes. I acknowledge that I will receive a copy of this consent concurrently upon execution via email to the email address I last provided but may also request a printed copy by calling the office of [place].    I understand that my insurance will be billed for this visit.   I have read or had this consent read to me. . I understand the contents of this consent, which adequately explains the benefits and risks of the Services being provided via telemedicine.  . I have been provided ample opportunity to ask questions regarding this consent and the Services and have had my questions answered to my satisfaction. . I give my informed consent for the services to be provided through the use of telemedicine in my medical care  By participating in this telemedicine visit I agree to the above.

## 2018-07-25 ENCOUNTER — Telehealth (INDEPENDENT_AMBULATORY_CARE_PROVIDER_SITE_OTHER): Payer: Medicare Other | Admitting: Cardiovascular Disease

## 2018-07-25 ENCOUNTER — Other Ambulatory Visit: Payer: Self-pay

## 2018-07-25 DIAGNOSIS — I359 Nonrheumatic aortic valve disorder, unspecified: Secondary | ICD-10-CM

## 2018-07-25 DIAGNOSIS — Z79899 Other long term (current) drug therapy: Secondary | ICD-10-CM | POA: Diagnosis not present

## 2018-07-25 DIAGNOSIS — I25118 Atherosclerotic heart disease of native coronary artery with other forms of angina pectoris: Secondary | ICD-10-CM

## 2018-07-25 DIAGNOSIS — I10 Essential (primary) hypertension: Secondary | ICD-10-CM | POA: Diagnosis not present

## 2018-07-25 NOTE — Patient Instructions (Signed)
Medication Instructions:  Continue same medications as instructed If you need a refill on your cardiac medications before your next appointment, please call your pharmacy.   Lab work: None If you have labs (blood work) drawn today and your tests are completely normal, you will receive your results only by: [name] MyChart Message (if you have MyChart) OR . A paper copy in the mail If you have any lab test that is abnormal or we need to change your treatment, we will call you to review the results.  Testing/Procedures: Schedule an echocardiogram in July for aortic stenosis.  Follow-Up: At [facility], you and your health needs are our priority.  As part of our continuing mission to provide you with exceptional heart care, we have created designated Provider Care Teams.  These Care Teams include your primary Cardiologist (physician) and Advanced Practice Providers (APPs -  Physician Assistants and Nurse Practitioners) who all work together to provide you with the care you need, when you need it. You will need a follow up appointment in 4 months.  Please call our office 2 months in advance to schedule this appointment.  You may see [name], MD or one of the following Advanced Practice Providers on your designated Care Team:   Murray Hodgkins, NP Christell Faith, PA-C . Marrianne Mood, PA-C  Any Other Special Instructions Will Be Listed Below (If Applicable). Office visit to be scheduled on the same day as echocardiogram

## 2018-07-25 NOTE — Progress Notes (Signed)
Virtual Visit via Video Note    Evaluation Performed:  Follow-up visit  This visit type was conducted due to national recommendations for restrictions regarding the COVID-19 Pandemic (e.g. social distancing).  This format is felt to be most appropriate for this patient at this time.  All issues noted in this document were discussed and addressed.  No physical exam was performed (except for noted visual exam findings with Video Visits).  Please refer to the patient's chart (MyChart message for video visits and phone note for telephone visits) for the patient's consent to telehealth for [facility].  Date:  07/25/2018   ID:  Maureen Morales, DOB Sep 08, 1922, MRN [id number]  Patient Location:  [place] [address]   Provider location:   [place]  PCP:  [name], MD  Cardiologist:  [name], MD  Electrophysiologist:  None   Chief Complaint: Elevated blood pressure and occasional chest pain.  History of Present Illness:    Maureen Morales is a 85 y.o. female who presents via audio/video conferencing for a telehealth visit today.    She has chronic medical conditions that include hypertension, hyperlipidemia and type 2 diabetes.  She was hospitalized in May of 2018 with non-ST elevation myocardial infarction in the setting of uncontrolled hypertension. Troponin peaked at 5.79. Echocardiogram showed hyperdynamic LV systolic function, mild aortic stenosis with mean gradient of 14 mmHg, mild mitral regurgitation, mild pulmonary hypertension and trivial posterior pericardial effusion. Cardiac catheterization showed mild to moderate nonobstructive coronary artery disease. Worst stenosis was 60% in the mid right coronary artery. Outpatient renal artery duplex showed no evidence of renal artery stenosis. She fell in October, 2018 and fractured her right hip.  She underwent surgical repair.  She fell again in January and fractured the same.   Another surgery was performed.  Most recent echocardiogram in July 2019 showed normal LV systolic function, grade 2 diastolic dysfunction, moderate aortic stenosis with a mean gradient of 14 mmHg and valve area of 0.95.  She has difficult to control blood pressure with intolerance to antihypertensive medications.  She has been having elevated blood pressure lately with systolic blood pressure ranging from 140 to 170 mmHg.  She had one episode where her blood pressure was 250 systolic and was associated with right-sided chest discomfort.  She does take hydralazine 25 mg as needed when blood pressure is elevated.  The patient does not symptoms concerning for COVID-19 infection (fever, chills, cough, or new SHORTNESS OF BREATH).    Prior CV studies:   The following studies were reviewed today:    Past Medical History:  Diagnosis Date  . Aortic stenosis    a. 08/2016 Echo: Hyperdynamic LV fxn, mild AS (mean grad 12mmHg), mild MR, mild PAH; b. 05/2017 Echo: Hyperdynamic LV fxn, mod AS (mean grad 36mmHg, valve area 1.84). PASP 158mmHg.  . Asthma without status asthmaticus    unspecified  . Colitis    unspecified  . Complication of anesthesia    sensitive to anesthesia  . Depression   . Diabetes mellitus type 2, uncomplicated ([place])   . Essential hypertension   . GERD (gastroesophageal reflux disease)   . 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 Hyperlipidemia    unspecified  . Hypertension    a. 08/2017 Renal U/S: No RAS.  [name] Myocardial infarction ([place]) 2018  . Non-obstructive CAD (coronary artery disease)    a. 08/2016 NSTEMI/Cath: mild to mod nonobs dzs  including 60% mRCA stenosis-->Med rx.  . Peripheral vascular disease ([place])   . Positive H. pylori test   . Viral gastroenteritis due to [place]-like agent Nov 2012   Past Surgical History:  Procedure Laterality Date  . ABDOMINAL HYSTERECTOMY    . BLADDER SURGERY    . CARDIAC CATHETERIZATION    . CHOLECYSTECTOMY    .  COLONOSCOPY     06/16/1987, 02/20/1995, 05/04/1999, 02/14/2005, 05/17/2007  . ESOPHAGOGASTRODUODENOSCOPY     05/11/1987, 06/13/1995, 10/09/1995, 05/04/1999, 01/16/2002  . FLEXIBLE SIGMOIDOSCOPY N/A 05/02/2017   Procedure: FLEXIBLE SIGMOIDOSCOPY;  Surgeon: Manya Silvas, MD;  Location: [facility] ENDOSCOPY;  Service: Endoscopy;  Laterality: N/A;  . HEMORRHOID SURGERY N/A 05/14/2017   Procedure: EXTERNAL THROMBOSED HEMORRHOIDECTOMY;  Surgeon: Robert Bellow, MD;  Location: [facility] ORS;  Service: General;  Laterality: N/A;  . HIP ARTHROPLASTY Right 02/17/2017   Procedure: ARTHROPLASTY BIPOLAR HIP (HEMIARTHROPLASTY);  Surgeon: Claud Kelp, MD;  Location: [facility] ORS;  Service: Orthopedics;  Laterality: Right;  . LEFT HEART CATH AND CORONARY ANGIOGRAPHY N/A 08/31/2016   Procedure: Left Heart Cath and Coronary Angiography;  Surgeon: Wellington Hampshire, MD;  Location: [place] CV LAB;  Service: Cardiovascular;  Laterality: N/A;  . ORIF PERIPROSTHETIC FRACTURE Right 05/27/2017  . ORIF PERIPROSTHETIC FRACTURE Right 05/27/2017   Procedure: OPEN REDUCTION INTERNAL FIXATION (ORIF) PERIPROSTHETIC FRACTURE;  Surgeon: Mcarthur Rossetti, MD;  Location: [place];  Service: Orthopedics;  Laterality: Right;  . TOTAL HIP ARTHROPLASTY Right   . uterian prolapse       No outpatient medications have been marked as taking for the 07/25/18 encounter (Appointment) with Wellington Hampshire, MD.     Allergies:   Asa [aspirin]; Aspirin; Atropine; Belladonna alkaloids; Codeine; Darvon; Darvon [propoxyphene]; Demerol [meperidine]; Ferrous sulfate; Meperidine and related; Methocarbamol; Penicillins; Penicillins; Sulfa antibiotics; and Iron   Social History   Tobacco Use  . Smoking status: Never Smoker  . Smokeless tobacco: Never Used  Substance Use Topics  . Alcohol use: No    Frequency: Never  . Drug use: No     Family Hx: The patient's family history includes Breast cancer (age of onset: 48) in her sister; Breast  cancer (age of onset: 53) in her mother; Cancer in her mother; Colon cancer in her brother, brother, and father; Stroke in her sister.  ROS:   Please see the history of present illness.     All other systems reviewed and are negative.   Labs/Other Tests and Data Reviewed:    Recent Labs: 02/11/2018: ALT 24; BUN 40; Creatinine, Ser 1.02; Hemoglobin 11.9; Platelets 233.0; Potassium 3.5; Sodium 137   Recent Lipid Panel Lab Results  Component Value Date/Time   CHOL 132 12/04/2016 08:08 AM   TRIG 128.0 12/04/2016 08:08 AM   HDL 38.30 (L) 12/04/2016 08:08 AM   CHOLHDL 3 12/04/2016 08:08 AM   LDLCALC 68 12/04/2016 08:08 AM   LDLDIRECT 92.0 07/06/2016 10:59 AM    Wt Readings from Last 3 Encounters:  06/13/18 140 lb (63.5 kg)  03/21/18 140 lb 8 oz (63.7 kg)  12/19/17 133 lb 8 oz (60.6 kg)     Exam:    Vital Signs:  There were no vitals taken for this visit.   Well nourished, well developed female in no acute distress.  No respiratory distress noted.  ASSESSMENT & PLAN:    1.  Coronary artery disease involving native coronary arteries with stable angina: She is doing reasonably well overall with controlled symptoms.  Continue medical therapy.  Right-sided chest pain is likely noncardiac. She does have nitroglycerin to be used as needed.  She has not required lately.  2.  Moderate aortic stenosis.  Recommend repeat echocardiogram in July 2020.  3.  Essential hypertension: Blood pressure has been elevated lately but I am still hesitant to increase her antihypertensive medications given previous episodes of symptomatic hypotension.  I instructed her to continue to use hydralazine as needed in addition to other antihypertensive medications which were reviewed.  4.  Hyperlipidemia: Continue treatment with atorvastatin.  COVID-19 Education: The signs and symptoms of COVID-19 were discussed with the patient and how to seek care for testing (follow up with PCP or arrange E-visit).   The importance of social distancing was discussed today.  Patient Risk:   After full review of this patients clinical status, I feel that they are at least moderate risk at this time.  Time:   Today, I have spent 24 minutes with the patient with telehealth technology discussing .     Medication Adjustments/Labs and Tests Ordered: Current medicines are reviewed at length with the patient today.  Concerns regarding medicines are outlined above.  Tests Ordered: No orders of the defined types were placed in this encounter.  Medication Changes: No orders of the defined types were placed in this encounter.   Disposition:  in 4 month(s)  Signed, [name], MD  07/25/2018 12:10 PM    [facility]

## 2018-07-26 NOTE — Progress Notes (Signed)
Unable to schedule at this time as July Schedule is pending .  Will fu when schedule available.

## 2018-07-29 ENCOUNTER — Other Ambulatory Visit: Payer: Self-pay | Admitting: *Deleted

## 2018-07-29 ENCOUNTER — Other Ambulatory Visit: Payer: Self-pay | Admitting: Internal Medicine

## 2018-07-29 ENCOUNTER — Telehealth: Payer: Self-pay

## 2018-07-29 DIAGNOSIS — I350 Nonrheumatic aortic (valve) stenosis: Secondary | ICD-10-CM

## 2018-07-29 MED ORDER — NITROGLYCERIN 0.4 MG SL SUBL: 0.4000 mg | SUBLINGUAL_TABLET | SUBLINGUAL | 2 refills | Status: AC | PRN

## 2018-07-29 MED ORDER — NITROGLYCERIN 0.4 MG SL SUBL: 0.4000 mg | SUBLINGUAL_TABLET | SUBLINGUAL | 0 refills | Status: DC | PRN

## 2018-07-29 NOTE — Telephone Encounter (Signed)
Patient is requesting a new RX for Nitrostat 0.4 mg to have on hand and sed to pharmacy.  Nina,cma

## 2018-07-29 NOTE — Telephone Encounter (Signed)
rx has been sent in

## 2018-08-07 ENCOUNTER — Other Ambulatory Visit: Payer: Self-pay

## 2018-08-07 ENCOUNTER — Other Ambulatory Visit (INDEPENDENT_AMBULATORY_CARE_PROVIDER_SITE_OTHER): Payer: Medicare Other

## 2018-08-07 ENCOUNTER — Other Ambulatory Visit: Payer: Self-pay | Admitting: Internal Medicine

## 2018-08-07 DIAGNOSIS — R300 Dysuria: Secondary | ICD-10-CM

## 2018-08-07 LAB — POCT URINALYSIS DIPSTICK
Bilirubin, UA: NEGATIVE
Glucose, UA: NEGATIVE
Ketones, UA: NEGATIVE
Nitrite, UA: NEGATIVE
Protein, UA: NEGATIVE
Spec Grav, UA: 1.01 (ref 1.010–1.025)
Urobilinogen, UA: 0.2 E.U./dL
pH, UA: 5 (ref 5.0–8.0)

## 2018-08-07 LAB — URINALYSIS, MICROSCOPIC ONLY

## 2018-08-07 NOTE — Progress Notes (Signed)
poct

## 2018-08-08 LAB — URINE CULTURE
MICRO NUMBER:: 383259
Result:: NO GROWTH
SPECIMEN QUALITY:: ADEQUATE

## 2018-08-21 DIAGNOSIS — N3941 Urge incontinence: Secondary | ICD-10-CM | POA: Diagnosis not present

## 2018-09-02 IMAGING — DX DG CHEST 1V PORT
1 series · 1 of 1 positions shown · non-contrast
Comparison: 02/16/2017, 11/01/2016 and 08/30/2016

CLINICAL DATA: Syncope.  Weakness.  Two episodes of vomiting.

EXAM:
PORTABLE CHEST 1 VIEW

[chest]
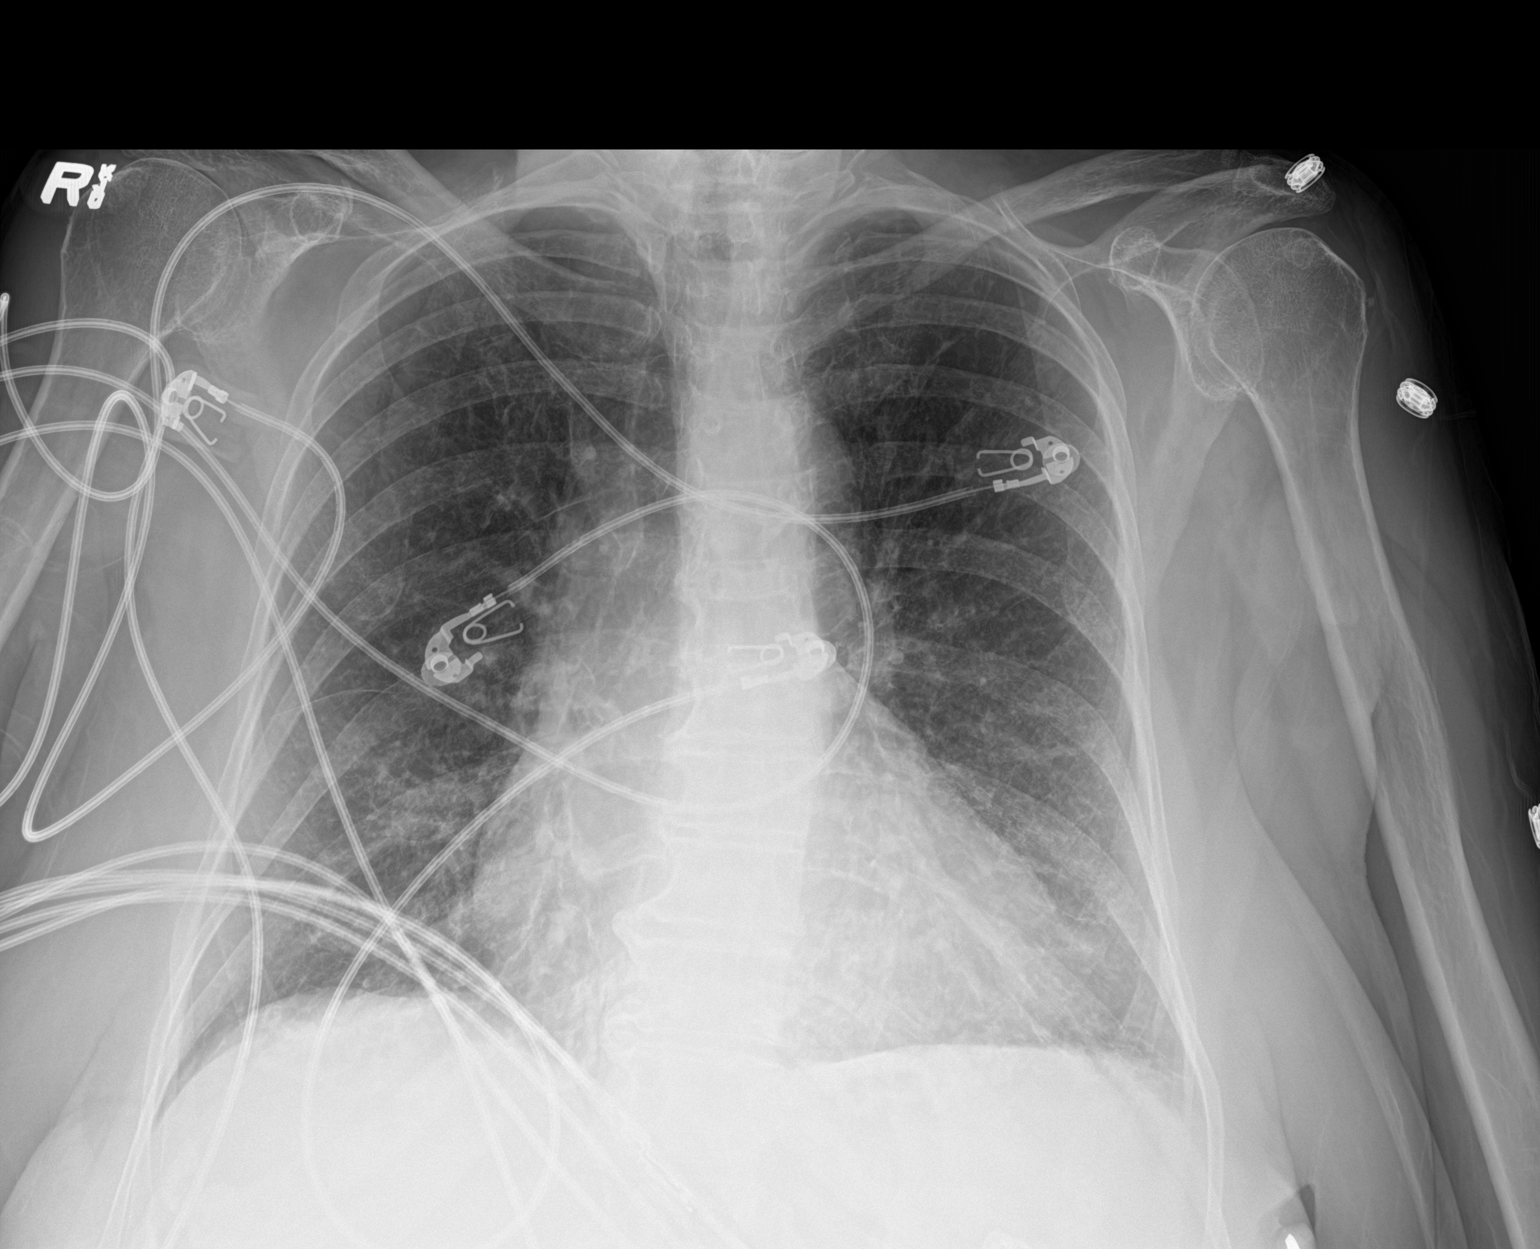

[1 of 1 positions shown; findings below may reference images not displayed]

FINDINGS: Lungs are adequately inflated with subtle focal interstitial
prominence. Mild stable cardiomegaly. Remainder of the exam is
unchanged.
IMPRESSION: No acute findings.

## 2018-09-16 ENCOUNTER — Other Ambulatory Visit: Payer: Self-pay | Admitting: Internal Medicine

## 2018-09-28 ENCOUNTER — Other Ambulatory Visit: Payer: Self-pay | Admitting: Internal Medicine

## 2018-09-28 DIAGNOSIS — E119 Type 2 diabetes mellitus without complications: Secondary | ICD-10-CM

## 2018-09-28 DIAGNOSIS — D500 Iron deficiency anemia secondary to blood loss (chronic): Secondary | ICD-10-CM

## 2018-09-28 DIAGNOSIS — R933 Abnormal findings on diagnostic imaging of other parts of digestive tract: Secondary | ICD-10-CM

## 2018-09-28 DIAGNOSIS — N183 Chronic kidney disease, stage 3 unspecified: Secondary | ICD-10-CM

## 2018-09-28 DIAGNOSIS — R5383 Other fatigue: Secondary | ICD-10-CM

## 2018-09-30 ENCOUNTER — Other Ambulatory Visit: Payer: Self-pay

## 2018-09-30 ENCOUNTER — Other Ambulatory Visit (INDEPENDENT_AMBULATORY_CARE_PROVIDER_SITE_OTHER): Payer: Medicare Other

## 2018-09-30 DIAGNOSIS — N183 Chronic kidney disease, stage 3 unspecified: Secondary | ICD-10-CM

## 2018-09-30 DIAGNOSIS — E119 Type 2 diabetes mellitus without complications: Secondary | ICD-10-CM | POA: Diagnosis not present

## 2018-09-30 DIAGNOSIS — D500 Iron deficiency anemia secondary to blood loss (chronic): Secondary | ICD-10-CM | POA: Diagnosis not present

## 2018-09-30 DIAGNOSIS — R5383 Other fatigue: Secondary | ICD-10-CM

## 2018-09-30 LAB — CBC WITH DIFFERENTIAL/PLATELET
Basophils Absolute: 0.1 10*3/uL (ref 0.0–0.1)
Basophils Relative: 0.6 % (ref 0.0–3.0)
Eosinophils Absolute: 0.3 10*3/uL (ref 0.0–0.7)
Eosinophils Relative: 3.4 % (ref 0.0–5.0)
HCT: 35.3 % — ABNORMAL LOW (ref 36.0–46.0)
Hemoglobin: 12.3 g/dL (ref 12.0–15.0)
Lymphocytes Relative: 17 % (ref 12.0–46.0)
Lymphs Abs: 1.6 10*3/uL (ref 0.7–4.0)
MCHC: 34.8 g/dL (ref 30.0–36.0)
MCV: 82.5 fl (ref 78.0–100.0)
Monocytes Absolute: 0.7 10*3/uL (ref 0.1–1.0)
Monocytes Relative: 7.5 % (ref 3.0–12.0)
Neutro Abs: 6.9 10*3/uL (ref 1.4–7.7)
Neutrophils Relative %: 71.5 % (ref 43.0–77.0)
Platelets: 203 10*3/uL (ref 150.0–400.0)
RBC: 4.28 Mil/uL (ref 3.87–5.11)
RDW: 14.6 % (ref 11.5–15.5)
WBC: 9.6 10*3/uL (ref 4.0–10.5)

## 2018-09-30 LAB — COMPREHENSIVE METABOLIC PANEL
ALT: 21 U/L (ref 0–35)
AST: 13 U/L (ref 0–37)
Albumin: 4.1 g/dL (ref 3.5–5.2)
Alkaline Phosphatase: 142 U/L — ABNORMAL HIGH (ref 39–117)
BUN: 40 mg/dL — ABNORMAL HIGH (ref 6–23)
CO2: 21 mEq/L (ref 19–32)
Calcium: 9.2 mg/dL (ref 8.4–10.5)
Chloride: 106 mEq/L (ref 96–112)
Creatinine, Ser: 1.09 mg/dL (ref 0.40–1.20)
GFR: 46.79 mL/min — ABNORMAL LOW (ref >60.00)
Glucose, Bld: 242 mg/dL — ABNORMAL HIGH (ref 70–99)
Potassium: 4.2 mEq/L (ref 3.5–5.1)
Sodium: 138 mEq/L (ref 135–145)
Total Bilirubin: 0.3 mg/dL (ref 0.2–1.2)
Total Protein: 6.8 g/dL (ref 6.0–8.3)

## 2018-09-30 LAB — TSH: TSH: 4.34 u[IU]/mL (ref 0.35–4.50)

## 2018-09-30 LAB — HEMOGLOBIN A1C: Hgb A1c MFr Bld: 9.7 % — ABNORMAL HIGH (ref 4.6–6.5)

## 2018-09-30 NOTE — Progress Notes (Signed)
Both scheduled for the afternoon of 11/28/2018

## 2018-10-01 ENCOUNTER — Other Ambulatory Visit: Payer: Medicare Other

## 2018-10-04 ENCOUNTER — Other Ambulatory Visit: Payer: Self-pay | Admitting: Cardiovascular Disease

## 2018-10-21 ENCOUNTER — Other Ambulatory Visit: Payer: Self-pay | Admitting: Internal Medicine

## 2018-10-31 ENCOUNTER — Telehealth: Payer: Self-pay | Admitting: Internal Medicine

## 2018-10-31 ENCOUNTER — Other Ambulatory Visit: Payer: Self-pay

## 2018-10-31 ENCOUNTER — Telehealth: Payer: Self-pay | Admitting: *Deleted

## 2018-10-31 DIAGNOSIS — Z20822 Contact with and (suspected) exposure to covid-19: Secondary | ICD-10-CM

## 2018-10-31 NOTE — Telephone Encounter (Signed)
Please set Maureen Morales and her husband [name] (83 yr old , not the son) up for COVID 19 TESTING.  They have both been exposed to a caregiver in their home who tested positive yesterday.  The exposure occurred last Friday

## 2018-10-31 NOTE — Telephone Encounter (Signed)
Pt and husband scheduled for covid testing today @ 2:00 @ [facility]. Instructions given and order placed

## 2018-11-04 ENCOUNTER — Other Ambulatory Visit: Payer: Self-pay | Admitting: Internal Medicine

## 2018-11-06 ENCOUNTER — Telehealth: Payer: Self-pay | Admitting: Internal Medicine

## 2018-11-06 LAB — NOVEL CORONAVIRUS, NAA: SARS-CoV-2, NAA: DETECTED — AB

## 2018-11-06 NOTE — Telephone Encounter (Signed)
YES PLEASE ORDER COVID [name] AND Maureen Morales Fam

## 2018-11-06 NOTE — Telephone Encounter (Signed)
Spoke with Maureen Morales and relayed the message below to her. 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 Maureen Morales is wanting to know if she and her aunt(that is also your pt) would be okay to get tested tomorrow.

## 2018-11-06 NOTE — Telephone Encounter (Signed)
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

## 2018-11-06 NOTE — Telephone Encounter (Signed)
Can you convey this information to [facility] regarding her mother's household contacts given that her  COVID-19 test was resulted as positive.  I told her most of it over the phone  But I did not spell it out like this:  the health dept will be contacting  Her to gather  additional information from you and they will also be able answer any questions you may have .  You should continue your self imposed quantarine until you can answer "yes" to ALL 3 of the conditions below:   1) Your symptoms (fever, cough, achiness  shortness of breath) started 7 or more days ago   2) Your body temperature  has been normal for at least 72 hours (WITHOUT the use of any tylenol, motrin or aleve) . Normal is < 100.4 Farenheit  3) your other flu  like symptoms symptoms are getting better.   YOUR FAMILY or other household contacts, however , even if they feel fine , will need to continue to quarantining themselves  (that means no contact with ANYONE outside of the house)  for 14 days STARTING from the end of your initial 7 day period . (why? because they have been theoretically exposed to you during the entire 7 days of your illness, and they can shed the virus without symptoms for that period of time ).

## 2018-11-27 ENCOUNTER — Other Ambulatory Visit: Payer: Self-pay | Admitting: Internal Medicine

## 2018-11-27 ENCOUNTER — Telehealth: Payer: Self-pay

## 2018-11-27 NOTE — Telephone Encounter (Signed)
  QUESTIONS ANSWERED BY DAUGHTER TENA  COVID-19 Pre-Screening Questions:  . In the past 7 to 10 days have you had a cough, shortness of breath, headache, congestion, fever (100 or greater), body aches, chills, sore throat, or sudden loss of taste or sense of smell? NO . Have you been around anyone with known Covid 19? NO . Have you been around anyone who is awaiting Covid 19 test results in the past 7 to 10 days? NO . Have you been around anyone who has been exposed to Covid 19, or has mentioned symptoms of Covid 19 within the past 7 to 10 days? NO  If you have any concerns/questions about symptoms patients report during screening (either on the phone or at threshold). Contact the provider seeing the patient or DOD for further guidance.  If neither are available contact a member of the leadership team.

## 2018-11-28 ENCOUNTER — Other Ambulatory Visit: Payer: Self-pay

## 2018-11-28 ENCOUNTER — Ambulatory Visit (INDEPENDENT_AMBULATORY_CARE_PROVIDER_SITE_OTHER): Payer: Medicare Other

## 2018-11-28 ENCOUNTER — Ambulatory Visit (INDEPENDENT_AMBULATORY_CARE_PROVIDER_SITE_OTHER): Payer: Medicare Other | Admitting: Cardiovascular Disease

## 2018-11-28 VITALS — BP 190/80 | HR 71 | Ht 61.0 in | Wt 138.5 lb

## 2018-11-28 DIAGNOSIS — I10 Essential (primary) hypertension: Secondary | ICD-10-CM

## 2018-11-28 DIAGNOSIS — I359 Nonrheumatic aortic valve disorder, unspecified: Secondary | ICD-10-CM | POA: Diagnosis not present

## 2018-11-28 DIAGNOSIS — E7849 Other hyperlipidemia: Secondary | ICD-10-CM

## 2018-11-28 DIAGNOSIS — I350 Nonrheumatic aortic (valve) stenosis: Secondary | ICD-10-CM

## 2018-11-28 DIAGNOSIS — I25118 Atherosclerotic heart disease of native coronary artery with other forms of angina pectoris: Secondary | ICD-10-CM

## 2018-11-28 NOTE — Patient Instructions (Signed)
Medication Instructions:  Your physician recommends that you continue on your current medications as directed. Please refer to the Current Medication list given to you today.  If you need a refill on your cardiac medications before your next appointment, please call your pharmacy.   Lab work: None ordered If you have labs (blood work) drawn today and your tests are completely normal, you will receive your results only by: [name] MyChart Message (if you have MyChart) OR . A paper copy in the mail If you have any lab test that is abnormal or we need to change your treatment, we will call you to review the results.  Testing/Procedures: None ordered  Follow-Up: At [facility], you and your health needs are our priority.  As part of our continuing mission to provide you with exceptional heart care, we have created designated Provider Care Teams.  These Care Teams include your primary Cardiologist (physician) and Advanced Practice Providers (APPs -  Physician Assistants and Nurse Practitioners) who all work together to provide you with the care you need, when you need it. You will need a follow up appointment in 6 months.  Please call our office 2 months in advance to schedule this appointment.  You may see [name], MD or one of the following Advanced Practice Providers on your designated Care Team:   Murray Hodgkins, NP Christell Faith, PA-C . Marrianne Mood, PA-C

## 2018-11-28 NOTE — Progress Notes (Signed)
Cardiology Office Note   Date:  11/28/2018   ID:  Maureen Morales, [name] October 08, 1922, MRN [id number]  PCP:  [name], MD  Cardiologist:   [name], MD   Chief Complaint  Patient presents with  . Other    4 month follow up. Patient c/o chest pain and SOB. Meds reviewed verbally with patient.       History of Present Illness: Maureen Morales is a 85 y.o. female who is here today for follow-up visit. She has chronic medical conditions that include hypertension, hyperlipidemia and type 2 diabetes.  She was hospitalized in May of 2018 with non-ST elevation myocardial infarction in the setting of uncontrolled hypertension. Troponin peaked at 5.79. Echocardiogram showed hyperdynamic LV systolic function, mild aortic stenosis with mean gradient of 14 mmHg, mild mitral regurgitation, mild pulmonary hypertension and trivial posterior pericardial effusion. Cardiac catheterization showed mild to moderate nonobstructive coronary artery disease. Worst stenosis was 60% in the mid right coronary artery. Outpatient renal artery duplex showed no evidence of renal artery stenosis. She had right hip fracture in 2018 after a fall and required another surgery after another fall.  She underwent an echocardiogram today which showed normal LV systolic function.  Aortic stenosis continues to be in the moderate range with a mean gradient of 16 mmHg and valve area of 1.3 cm.  She has difficult to control blood pressure with intolerance to antihypertensive medications.  Blood pressure is elevated today but her blood pressure is more controlled at home.  Unfortunately, her husband died recently and obviously that caused increased stress.  She did have an episode of chest pain recently that responded to 1 sublingual nitroglycerin.  Past Medical History:  Diagnosis Date  . Aortic stenosis    a. 08/2016 Echo: Hyperdynamic LV fxn, mild AS (mean grad 31mmHg), mild MR, mild PAH; b. 05/2017 Echo: Hyperdynamic  LV fxn, mod AS (mean grad 84mmHg, valve area 1.84). PASP 180mmHg.  . Asthma without status asthmaticus    unspecified  . Colitis    unspecified  . Complication of anesthesia    sensitive to anesthesia  . Depression   . Diabetes mellitus type 2, uncomplicated ([place])   . Essential hypertension   . GERD (gastroesophageal reflux disease)   . 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 Hyperlipidemia    unspecified  . Hypertension    a. 08/2017 Renal U/S: No RAS.  [name] Myocardial infarction ([place]) 2018  . Non-obstructive CAD (coronary artery disease)    a. 08/2016 NSTEMI/Cath: mild to mod nonobs dzs including 60% mRCA stenosis-->Med rx.  . Peripheral vascular disease ([place])   . Positive H. pylori test   . Viral gastroenteritis due to [place]-like agent Nov 2012    Past Surgical History:  Procedure Laterality Date  . ABDOMINAL HYSTERECTOMY    . BLADDER SURGERY    . CARDIAC CATHETERIZATION    . CHOLECYSTECTOMY    . COLONOSCOPY     06/16/1987, 02/20/1995, 05/04/1999, 02/14/2005, 05/17/2007  . ESOPHAGOGASTRODUODENOSCOPY     05/11/1987, 06/13/1995, 10/09/1995, 05/04/1999, 01/16/2002  . FLEXIBLE SIGMOIDOSCOPY N/A 05/02/2017   Procedure: FLEXIBLE SIGMOIDOSCOPY;  Surgeon: Manya Silvas, MD;  Location[facility] ENDOSCOPY;  Service: Endoscopy;  Laterality: N/A;  . HEMORRHOID SURGERY N/A 05/14/2017   Procedure: EXTERNAL THROMBOSED HEMORRHOIDECTOMY;  Surgeon: Robert Bellow, MD;  Location: [facility] ORS;  Service: General;  Laterality: N/A;  . HIP ARTHROPLASTY Right 02/17/2017   Procedure: ARTHROPLASTY BIPOLAR HIP (HEMIARTHROPLASTY);  Surgeon:  Claud Kelp, MD;  Location: [facility] ORS;  Service: Orthopedics;  Laterality: Right;  . LEFT HEART CATH AND CORONARY ANGIOGRAPHY N/A 08/31/2016   Procedure: Left Heart Cath and Coronary Angiography;  Surgeon: Wellington Hampshire, MD;  Location: [place] CV LAB;  Service: Cardiovascular;  Laterality: N/A;  . ORIF PERIPROSTHETIC FRACTURE Right 05/27/2017  . ORIF  PERIPROSTHETIC FRACTURE Right 05/27/2017   Procedure: OPEN REDUCTION INTERNAL FIXATION (ORIF) PERIPROSTHETIC FRACTURE;  Surgeon: Mcarthur Rossetti, MD;  Location: [place];  Service: Orthopedics;  Laterality: Right;  . TOTAL HIP ARTHROPLASTY Right   . uterian prolapse       Current Outpatient Medications  Medication Sig Dispense Refill  . atorvastatin (LIPITOR) 40 MG tablet TAKE 1 TABLET BY MOUTH DAILY 90 tablet 1  . carvedilol (COREG) 6.25 MG tablet Take 6.25 mg by mouth 2 (two) times daily with a meal.    . FLORA-Q (FLORA-Q) CAPS capsule Take 1 capsule by mouth daily.    [name] glipiZIDE (GLUCOTROL) 5 MG tablet TAKE ONE-HALF TABLET BY MOUTH EVERY DAY BEFORE BREAKFAST 90 tablet 1  . glucose blood (BAYER CONTOUR TEST) test strip Use as instructed 100 each 5  . hydrALAZINE (APRESOLINE) 25 MG tablet 25 mg 2 (two) times a day.     . hydrochlorothiazide (MICROZIDE) 12.5 MG capsule TAKE 1 CAPSULE (12.5 MG) BY MOUTH EVERY DAY 30 capsule 6  . isosorbide mononitrate (IMDUR) 60 MG 24 hr tablet TAKE 1 TABLET BY MOUTH DAILY 90 tablet 0  . loratadine (CLARITIN) 10 MG tablet Take 10 mg daily by mouth.     . losartan (COZAAR) 100 MG tablet TAKE 1 TABLET BY MOUTH DAILY 90 tablet 1  . nitroGLYCERIN (NITROSTAT) 0.4 MG SL tablet Place 1 tablet (0.4 mg total) under the tongue every 5 (five) minutes as needed for chest pain. 10 tablet 2  . sertraline (ZOLOFT) 50 MG tablet TAKE 1 AND 1/2 TABLET EVERY DAY 140 tablet 1  . vitamin C (ASCORBIC ACID) 500 MG tablet Take 1 tablet (500 mg total) by mouth 2 (two) times daily. 60 tablet 2  . Trospium Chloride 60 MG CP24      No current facility-administered medications for this visit.     Allergies:   Asa [aspirin], Aspirin, Atropine, Belladonna alkaloids, Codeine, Darvon, Darvon [propoxyphene], Demerol [meperidine], Ferrous sulfate, Meperidine and related, Methocarbamol, Penicillins, Penicillins, Sulfa antibiotics, and Iron    Social History:  The patient  reports that  she has never smoked. She has never used smokeless tobacco. She reports that she does not drink alcohol or use drugs.   Family History:  The patient's family history includes Breast cancer (age of onset: 32) in her sister; Breast cancer (age of onset: 25) in her mother; Cancer in her mother; Colon cancer in her brother, brother, and father; Stroke in her sister.    ROS:  Please see the history of present illness.   Otherwise, review of systems are positive for none.   All other systems are reviewed and negative.    PHYSICAL EXAM: VS:  BP (!) 190/80 (BP Location: Right Arm, Patient Position: Sitting, Cuff Size: Normal)   Pulse 71   Ht 5\' 1"  (1.549 m)   Wt 138 lb 8 oz (62.8 kg)   SpO2 97%   BMI 26.17 kg/m  , BMI Body mass index is 26.17 kg/m. GEN: Well nourished, well developed, in no acute distress  HEENT: normal  Neck: no JVD, carotid bruits, or masses Cardiac: RRR; no rubs, or  gallops, moderate bilateral leg edema . 3/6 crescendo decrescendo systolic murmur in the aortic area which is mid peaking Respiratory:  clear to auscultation bilaterally, normal work of breathing GI: soft, nontender, nondistended, + BS MS: no deformity or atrophy  Skin: warm and dry, no rash Neuro:  Strength and sensation are intact Psych: euthymic mood, full affect    EKG:  EKG is ordered today. The ekg ordered today demonstrates normal sinus rhythm with sinus arrhythmia.  Nonspecific ST changes.   Recent Labs: 09/30/2018: ALT 21; BUN 40; Creatinine, Ser 1.09; Hemoglobin 12.3; Platelets 203.0; Potassium 4.2; Sodium 138; TSH 4.34    Lipid Panel    Component Value Date/Time   CHOL 132 12/04/2016 0808   TRIG 128.0 12/04/2016 0808   HDL 38.30 (L) 12/04/2016 0808   CHOLHDL 3 12/04/2016 0808   VLDL 25.6 12/04/2016 0808   LDLCALC 68 12/04/2016 0808   LDLDIRECT 92.0 07/06/2016 1059      Wt Readings from Last 3 Encounters:  11/28/18 138 lb 8 oz (62.8 kg)  06/13/18 140 lb (63.5 kg)  03/21/18 140 lb 8  oz (63.7 kg)       No flowsheet data found.    ASSESSMENT AND PLAN:  1.  Coronary artery disease involving native coronary arteries with stable angina: She is doing reasonably well overall with controlled symptoms.  Continue medical therapy.   Recent episode of chest pain in the setting of stress after the death of her husband.  Continue to monitor symptoms closely for now.  2.  Moderate aortic stenosis.  This continues to be moderate on echocardiogram which was done today.  Repeat study in 1 year.  3.  Essential hypertension: I repeated her blood pressure and it was 150/70.  Home blood pressure readings are much better.  I elected not to make any changes but we could consider increasing carvedilol in the future if needed.  4.  Hyperlipidemia: Continue treatment with atorvastatin.   Disposition:   FU  in 6 months.  Signed,  [name], MD  11/28/2018 5:09 PM    [place]

## 2018-12-14 ENCOUNTER — Other Ambulatory Visit: Payer: Self-pay | Admitting: Internal Medicine

## 2019-01-06 ENCOUNTER — Other Ambulatory Visit: Payer: Self-pay | Admitting: Internal Medicine

## 2019-01-08 ENCOUNTER — Other Ambulatory Visit: Payer: Self-pay | Admitting: Cardiovascular Disease

## 2019-01-10 ENCOUNTER — Other Ambulatory Visit: Payer: Self-pay | Admitting: Internal Medicine

## 2019-01-10 ENCOUNTER — Other Ambulatory Visit: Payer: Self-pay | Admitting: *Deleted

## 2019-01-10 DIAGNOSIS — Z20822 Contact with and (suspected) exposure to covid-19: Secondary | ICD-10-CM | POA: Insufficient documentation

## 2019-01-10 HISTORY — DX: Contact with and (suspected) exposure to covid-19: Z20.822

## 2019-01-10 NOTE — Assessment & Plan Note (Signed)
Retesting advised.  Patient going to [facility] this afternoon

## 2019-01-10 NOTE — Progress Notes (Signed)
Patient reporting malaise and headache for the past 3 days,  Similar to presentation when she was positive for  COVID 19 INFECTION  July 2 .  Previous inection was caused by exposure to COVID positive caregiver.  Repeat scenario:  Same caregiver's mother tested positive on Monday and caregiver saw current patient on Tuesday

## 2019-01-12 LAB — NOVEL CORONAVIRUS, NAA: SARS-CoV-2, NAA: NOT DETECTED

## 2019-01-13 ENCOUNTER — Other Ambulatory Visit: Payer: Self-pay | Admitting: Internal Medicine

## 2019-01-13 NOTE — Progress Notes (Signed)
Patient notifIed of negative COVID TEST RESULT.  Her contact was tested twice and also negative. (no repeat infection for either)

## 2019-01-15 ENCOUNTER — Other Ambulatory Visit: Payer: Self-pay | Admitting: Internal Medicine

## 2019-01-15 DIAGNOSIS — R748 Abnormal levels of other serum enzymes: Secondary | ICD-10-CM

## 2019-01-15 DIAGNOSIS — Z8616 Personal history of COVID-19: Secondary | ICD-10-CM

## 2019-01-15 DIAGNOSIS — E1169 Type 2 diabetes mellitus with other specified complication: Secondary | ICD-10-CM

## 2019-01-15 DIAGNOSIS — Z8619 Personal history of other infectious and parasitic diseases: Secondary | ICD-10-CM

## 2019-01-15 DIAGNOSIS — E119 Type 2 diabetes mellitus without complications: Secondary | ICD-10-CM

## 2019-01-15 DIAGNOSIS — E785 Hyperlipidemia, unspecified: Secondary | ICD-10-CM

## 2019-01-15 DIAGNOSIS — D500 Iron deficiency anemia secondary to blood loss (chronic): Secondary | ICD-10-CM

## 2019-01-16 ENCOUNTER — Telehealth: Payer: Self-pay | Admitting: Cardiovascular Disease

## 2019-01-16 ENCOUNTER — Other Ambulatory Visit: Payer: Self-pay | Admitting: Internal Medicine

## 2019-01-16 DIAGNOSIS — R0600 Dyspnea, unspecified: Secondary | ICD-10-CM

## 2019-01-16 NOTE — Telephone Encounter (Signed)
Spoke with the patient's daughter Maureen Morales. DPR on file. Tena sts that the patient has been experiencing sob and wheezing at night for several months Recently she has bee having wheezing during the day. Patient denies chest pain, dizziness, pre-syncope or syncope. No noticeable weight gain, No orthopnea or PND. She does have some swelling in her feet. She spends several hours a day in her wheelchair. Ian Bushman to have the patient elevate her feet as much as possible.  The patient is taking all medications as prescribed including HCTZ 12.5mg  qd. Tena sts that at time the wheezing is audible to the naked ear. Asthma is listed on the patients dx list. She does not have an inhaler or nebulizer at home.  Patient test positive for COVID in April. Ian Bushman that I would recommend that she contact Dr. Lupita Dawn office to update her on the patient's symptoms. Ian Bushman that I will fwd an update to Dr. Fletcher Anon and call back with his recommendation.  Maureen Morales is agreeable with plan and voiced appreciation for the call back.

## 2019-01-16 NOTE — Telephone Encounter (Signed)
Spoke with the patient's daughter [name]. Tena sts that Dr. Derrel Nip was out to the patient's home this afternoon. After evaluation Dr.Tullo is recommending a consult with a pulmonology specialist.  Rogers Blocker of Dr.Arida's recommendation. Advised Tena that I will fwd an update to Dr. Fletcher Anon and a message to Dr. Derrel Nip to ask if the BNP Dr. Fletcher Anon is recommending can be added to her lab orders.

## 2019-01-16 NOTE — Telephone Encounter (Signed)
Wheezing can be a sign of heart failure or reactive airway disease especially that she had COVID in early July.  I see that Dr. Derrel Nip ordered some blood work on her yesterday.  When is she going to have this done?.  Please add BNP to that to see if there is evidence of volume overload.  She also will need to have a chest x-ray done.  After getting the results of labs and chest x-ray will have a better idea if this is heart related or related to her lungs.

## 2019-01-16 NOTE — Telephone Encounter (Signed)
Patient's daughter calling in stating that patient has been wheezing while sleeping over the past 8 months. Patients daughter is now saying that patient is wheezing during the daytime. Daughter also states the Dr. Derrel Nip has checked out patients lungs and said they were clear, about 5-6 months ago.  Occassionaly patient gasps for air while sitting still.  Pt c/o Shortness Of Breath: STAT if SOB developed within the last 24 hours or pt is noticeably SOB on the phone  1. Are you currently SOB (can you hear that pt is SOB on the phone)? No, comes and goes  2. How long have you been experiencing SOB? approx 8 months  3. Are you SOB when sitting or when up moving around? sitting  4. Are you currently experiencing any other symptoms? See above note

## 2019-01-17 ENCOUNTER — Ambulatory Visit (INDEPENDENT_AMBULATORY_CARE_PROVIDER_SITE_OTHER): Payer: Medicare Other

## 2019-01-17 ENCOUNTER — Encounter: Payer: Self-pay | Admitting: Internal Medicine

## 2019-01-17 ENCOUNTER — Ambulatory Visit (INDEPENDENT_AMBULATORY_CARE_PROVIDER_SITE_OTHER): Payer: Medicare Other | Admitting: Internal Medicine

## 2019-01-17 VITALS — BP 170/84 | HR 87 | Temp 98.4°F | Wt 144.8 lb

## 2019-01-17 DIAGNOSIS — R300 Dysuria: Secondary | ICD-10-CM | POA: Diagnosis not present

## 2019-01-17 DIAGNOSIS — R0689 Other abnormalities of breathing: Secondary | ICD-10-CM | POA: Diagnosis not present

## 2019-01-17 DIAGNOSIS — R6889 Other general symptoms and signs: Secondary | ICD-10-CM

## 2019-01-17 DIAGNOSIS — R748 Abnormal levels of other serum enzymes: Secondary | ICD-10-CM | POA: Diagnosis not present

## 2019-01-17 DIAGNOSIS — E785 Hyperlipidemia, unspecified: Secondary | ICD-10-CM

## 2019-01-17 DIAGNOSIS — R350 Frequency of micturition: Secondary | ICD-10-CM

## 2019-01-17 DIAGNOSIS — D500 Iron deficiency anemia secondary to blood loss (chronic): Secondary | ICD-10-CM | POA: Diagnosis not present

## 2019-01-17 DIAGNOSIS — R0609 Other forms of dyspnea: Secondary | ICD-10-CM

## 2019-01-17 DIAGNOSIS — I10 Essential (primary) hypertension: Secondary | ICD-10-CM | POA: Diagnosis not present

## 2019-01-17 DIAGNOSIS — Z20822 Contact with and (suspected) exposure to covid-19: Secondary | ICD-10-CM

## 2019-01-17 DIAGNOSIS — Z8619 Personal history of other infectious and parasitic diseases: Secondary | ICD-10-CM

## 2019-01-17 DIAGNOSIS — E119 Type 2 diabetes mellitus without complications: Secondary | ICD-10-CM

## 2019-01-17 DIAGNOSIS — R05 Cough: Secondary | ICD-10-CM | POA: Diagnosis not present

## 2019-01-17 DIAGNOSIS — J849 Interstitial pulmonary disease, unspecified: Secondary | ICD-10-CM | POA: Diagnosis not present

## 2019-01-17 DIAGNOSIS — I25118 Atherosclerotic heart disease of native coronary artery with other forms of angina pectoris: Secondary | ICD-10-CM

## 2019-01-17 DIAGNOSIS — I5032 Chronic diastolic (congestive) heart failure: Secondary | ICD-10-CM | POA: Diagnosis not present

## 2019-01-17 DIAGNOSIS — E782 Mixed hyperlipidemia: Secondary | ICD-10-CM

## 2019-01-17 DIAGNOSIS — E1169 Type 2 diabetes mellitus with other specified complication: Secondary | ICD-10-CM | POA: Diagnosis not present

## 2019-01-17 DIAGNOSIS — F411 Generalized anxiety disorder: Secondary | ICD-10-CM

## 2019-01-17 DIAGNOSIS — R0600 Dyspnea, unspecified: Secondary | ICD-10-CM | POA: Diagnosis not present

## 2019-01-17 DIAGNOSIS — Z8616 Personal history of COVID-19: Secondary | ICD-10-CM

## 2019-01-17 LAB — COMPREHENSIVE METABOLIC PANEL
ALT: 38 U/L — ABNORMAL HIGH (ref 0–35)
AST: 22 U/L (ref 0–37)
Albumin: 4.3 g/dL (ref 3.5–5.2)
Alkaline Phosphatase: 105 U/L (ref 39–117)
BUN: 32 mg/dL — ABNORMAL HIGH (ref 6–23)
CO2: 26 mEq/L (ref 19–32)
Calcium: 9.5 mg/dL (ref 8.4–10.5)
Chloride: 102 mEq/L (ref 96–112)
Creatinine, Ser: 1.12 mg/dL (ref 0.40–1.20)
GFR: 45.31 mL/min — ABNORMAL LOW (ref >60.00)
Glucose, Bld: 205 mg/dL — ABNORMAL HIGH (ref 70–99)
Potassium: 4 mEq/L (ref 3.5–5.1)
Sodium: 138 mEq/L (ref 135–145)
Total Bilirubin: 0.4 mg/dL (ref 0.2–1.2)
Total Protein: 6.7 g/dL (ref 6.0–8.3)

## 2019-01-17 LAB — LIPID PANEL
Cholesterol: 145 mg/dL (ref 0–200)
HDL: 32.8 mg/dL — ABNORMAL LOW (ref >39.00)
NonHDL: 112.53
Total CHOL/HDL Ratio: 4
Triglycerides: 314 mg/dL — ABNORMAL HIGH (ref 0.0–149.0)
VLDL: 62.8 mg/dL — ABNORMAL HIGH (ref 0.0–40.0)

## 2019-01-17 LAB — URINALYSIS, ROUTINE W REFLEX MICROSCOPIC
Bacteria, UA: NONE SEEN
Bilirubin Urine: NEGATIVE
Hgb urine dipstick: NEGATIVE
Ketones, ur: NEGATIVE
Nitrite: NEGATIVE
RBC / HPF: NONE SEEN (ref 0–?)
Specific Gravity, Urine: 1.01 (ref 1.000–1.030)
Total Protein, Urine: NEGATIVE
Urine Glucose: NEGATIVE
Urobilinogen, UA: 0.2 (ref 0.0–1.0)
pH: 5.5 (ref 5.0–8.0)

## 2019-01-17 LAB — TSH: TSH: 4.78 u[IU]/mL — ABNORMAL HIGH (ref 0.35–4.50)

## 2019-01-17 LAB — CBC WITH DIFFERENTIAL/PLATELET
Basophils Absolute: 0 10*3/uL (ref 0.0–0.1)
Basophils Relative: 0.5 % (ref 0.0–3.0)
Eosinophils Absolute: 0.5 10*3/uL (ref 0.0–0.7)
Eosinophils Relative: 5.3 % — ABNORMAL HIGH (ref 0.0–5.0)
HCT: 35.1 % — ABNORMAL LOW (ref 36.0–46.0)
Hemoglobin: 11.8 g/dL — ABNORMAL LOW (ref 12.0–15.0)
Lymphocytes Relative: 18.3 % (ref 12.0–46.0)
Lymphs Abs: 1.6 10*3/uL (ref 0.7–4.0)
MCHC: 33.7 g/dL (ref 30.0–36.0)
MCV: 85 fl (ref 78.0–100.0)
Monocytes Absolute: 0.7 10*3/uL (ref 0.1–1.0)
Monocytes Relative: 8.2 % (ref 3.0–12.0)
Neutro Abs: 5.9 10*3/uL (ref 1.4–7.7)
Neutrophils Relative %: 67.7 % (ref 43.0–77.0)
Platelets: 202 10*3/uL (ref 150.0–400.0)
RBC: 4.13 Mil/uL (ref 3.87–5.11)
RDW: 14.2 % (ref 11.5–15.5)
WBC: 8.7 10*3/uL (ref 4.0–10.5)

## 2019-01-17 LAB — SARS-COV-2 IGG: SARS-COV-2 IgG: 6.45

## 2019-01-17 LAB — BRAIN NATRIURETIC PEPTIDE: Pro B Natriuretic peptide (BNP): 156 pg/mL — ABNORMAL HIGH (ref 0.0–100.0)

## 2019-01-17 LAB — LDL CHOLESTEROL, DIRECT: Direct LDL: 67 mg/dL

## 2019-01-17 LAB — HEMOGLOBIN A1C: Hgb A1c MFr Bld: 7.6 % — ABNORMAL HIGH (ref 4.6–6.5)

## 2019-01-17 MED ORDER — GLIPIZIDE 5 MG PO TABS: 5.0000 mg | ORAL_TABLET | Freq: Two times a day (BID) | ORAL | 1 refills | Status: DC

## 2019-01-17 MED ORDER — HYDRALAZINE HCL 25 MG PO TABS: 50.0000 mg | ORAL_TABLET | Freq: Three times a day (TID) | ORAL | 5 refills | Status: DC

## 2019-01-17 NOTE — Addendum Note (Signed)
Addended by: Leeanne Rio on: 01/17/2019 01:36 PM   Modules accepted: Orders

## 2019-01-17 NOTE — Progress Notes (Signed)
Subjective:  Patient ID: Maureen Morales, female    DOB: [birth year]  Age: 85 y.o. MRN: [id number]  CC: The primary encounter diagnosis was Dysuria. Diagnoses of Dyspnea on exertion, Mixed hyperlipidemia, Elevated alkaline phosphatase measurement, Dyspnea and respiratory abnormalities, History of 2019 novel coronavirus disease (COVID-19), Hyperlipidemia associated with type 2 diabetes mellitus ([place]), Diabetes mellitus type 2 in nonobese ([place]), Iron deficiency anemia due to chronic blood loss, Chronic interstitial lung disease ([place]), Essential hypertension, Chronic diastolic (congestive) heart failure ([place]), Generalized anxiety disorder, and Suspected Covid-19 Virus Infection were also pertinent to this visit.  HPI Maureen Morales presents for follow up on type 2 DM,  Hypertension,  And recurrent reports of dyspnea, wheezing and elevated blood pressure.  Patient recently lost her husband after 36 yrs of marriage.  Maureen Morales died at home with Hospice at the age of 52.    She has handled the loss well,  But has been having persistently elevated blood pressure readings, as high as 200/110 accompanied by headache and malaise. She has had a recent titration of hydralazine to 50 mg  Tid but has only been receiving it bid   She had an uncomplicated COVID 19 INFECTION in July after having contact with a caregiver who tested positive.  She did not require medication and denied cough,  Just "felt bad" for 48 hours.    For the last several months (prior to [place] 19 infection) her family and staff have noticed episodes os labored breathing sounding like stridor/wheezing.  The episodes have occurred both with exertion (use of  A walker)  And in supine /reclining position.  She at times reports feeling short of breath but has never had a pulse oximetry reading below 95%. She was given an albuterol inhaler 7 years ago when she developed wheezing after cleaning the soot out of her fireplace (at the ago of 98!) but has no  formal diagnosis of asthma.   She has a remote history of tobacco use (quit over 50 years ago) and prior chest x rays have noted emphysematous changes ,  Hyperexpansion, and chronic features of interstitial disease (Jan 2019)   Patient does not check blood sugars more than once a day,  Last one was 135 yesterday in a fasting state.  Dos not recall any above 200 or less than 80.  No complaints today.  Taking her medications as directed,  Not exercising on a regular basis or trying to lose weight.  Patient voices awareness  of the foods he/she needs to avoid,  And follows a low GI diet about 50% of the time.  Has not had an annual diabetic eye exam.  Denies numbness and tingling in lower extremities.  Denies hypoglycemic symptoms.   Outpatient Medications Prior to Visit  Medication Sig Dispense Refill  . atorvastatin (LIPITOR) 40 MG tablet TAKE 1 TABLET BY MOUTH DAILY 90 tablet 1  . carvedilol (COREG) 6.25 MG tablet Take 6.25 mg by mouth 2 (two) times daily with a meal.    . FLORA-Q (FLORA-Q) CAPS capsule Take 1 capsule by mouth daily.    [name] glucose blood (BAYER CONTOUR TEST) test strip Use as instructed 100 each 5  . hydrochlorothiazide (MICROZIDE) 12.5 MG capsule TAKE 1 CAPSULE (12.5 MG) BY MOUTH EVERY DAY 30 capsule 6  . isosorbide mononitrate (IMDUR) 60 MG 24 hr tablet TAKE 1 TABLET BY MOUTH DAILY 90 tablet 0  . loratadine (CLARITIN) 10 MG tablet Take 10 mg daily by mouth.     [name]  losartan (COZAAR) 100 MG tablet TAKE 1 TABLET BY MOUTH DAILY 90 tablet 1  . nitroGLYCERIN (NITROSTAT) 0.4 MG SL tablet Place 1 tablet (0.4 mg total) under the tongue every 5 (five) minutes as needed for chest pain. 10 tablet 2  . sertraline (ZOLOFT) 50 MG tablet TAKE 1 AND 1/2 TABLET EVERY DAY 140 tablet 1  . Trospium Chloride 60 MG CP24 TAKE ONE CAPSULE EACH MORNING BEFORE BREAKFAST 90 capsule 1  . vitamin C (ASCORBIC ACID) 500 MG tablet Take 1 tablet (500 mg total) by mouth 2 (two) times daily. 60 tablet 2  . glipiZIDE  (GLUCOTROL) 5 MG tablet TAKE 1/2 TABLET EVERY DAY BEFORE BREAKFAST 90 tablet 1  . hydrALAZINE (APRESOLINE) 25 MG tablet TAKE ONE TABLET THREE TIMES A DAY AS NEEDED FOR SYSTOLIC GREATER [id number] tablet 0   No facility-administered medications prior to visit.     Review of Systems;  Patient denies headache, fevers, malaise, unintentional weight loss, skin rash, eye pain, sinus congestion and sinus pain, sore throat, dysphagia,  hemoptysis , cough, dyspnea, wheezing, chest pain, palpitations, orthopnea, edema, abdominal pain, nausea, melena, diarrhea, constipation, flank pain, dysuria, hematuria, urinary  Frequency, nocturia, numbness, tingling, seizures,  Focal weakness, Loss of consciousness,  Tremor, insomnia, depression, anxiety, and suicidal ideation.      Objective:  BP (!) 170/84   Pulse 87   Temp 98.4 F (36.9 C)   Wt 144 lb 12.8 oz (65.7 kg)   SpO2 95%   BMI 27.36 kg/m   BP Readings from Last 3 Encounters:  01/17/19 (!) 170/84  11/08/17 (!) 127/59  10/12/17 (!) 124/57    Wt Readings from Last 3 Encounters:  01/17/19 144 lb 12.8 oz (65.7 kg)  11/08/17 135 lb (61.2 kg)  05/22/17 126 lb (57.2 kg)    General appearance: alert, cooperative and appears stated age Ears: normal TM's and external ear canals both ears Throat: lips, mucosa, and tongue normal; teeth and gums normal Neck: no adenopathy, no carotid bruit, supple, symmetrical, trachea midline and thyroid not enlarged, symmetric, no tenderness/mass/nodules Back: symmetric, no curvature. ROM normal. No CVA tenderness. Lungs: clear to auscultation bilaterally Heart: regular rate and rhythm, S1, S2 normal, no murmur, click, rub or gallop Abdomen: soft, non-tender; bowel sounds normal; no masses,  no organomegaly Pulses: 2+ and symmetric Skin: Skin color, texture, turgor normal. No rashes or lesions Lymph nodes: Cervical, supraclavicular, and axillary nodes normal.  Lab Results  Component Value Date   HGBA1C 7.6  (H) 01/17/2019   HGBA1C 9.7 (H) 09/30/2018   HGBA1C 7.7 (H) 02/11/2018    Lab Results  Component Value Date   CREATININE 1.12 01/17/2019   CREATININE 1.09 09/30/2018   CREATININE 1.02 02/11/2018    Lab Results  Component Value Date   WBC 8.7 01/17/2019   HGB 11.8 (L) 01/17/2019   HCT 35.1 (L) 01/17/2019   PLT 202.0 01/17/2019   GLUCOSE 205 (H) 01/17/2019   CHOL 145 01/17/2019   TRIG 314.0 (H) 01/17/2019   HDL 32.80 (L) 01/17/2019   LDLDIRECT 67.0 01/17/2019   LDLCALC 68 12/04/2016   ALT 38 (H) 01/17/2019   AST 22 01/17/2019   NA 138 01/17/2019   K 4.0 01/17/2019   CL 102 01/17/2019   CREATININE 1.12 01/17/2019   BUN 32 (H) 01/17/2019   CO2 26 01/17/2019   TSH 4.78 (H) 01/17/2019   INR 1.26 05/26/2017   HGBA1C 7.6 (H) 01/17/2019   MICROALBUR 30.9 (H) 07/06/2016    Dg Bone  Density  Result Date: 01/31/2018 EXAM: DUAL X-RAY ABSORPTIOMETRY (DXA) FOR BONE MINERAL DENSITY IMPRESSION: Technologist: KBD PATIENT BIOGRAPHICAL: Name: Areyona, Schallert Patient ID: [id number] Birth Date: 12-27-1922 Height: 60.0 in. Gender: Female Exam Date: 01/31/2018 Weight: 133.5 lbs. Indications: Advanced Age, Caucasian, History of Fracture (Adult), Hysterectomy, Postmenopausal, right hip replacement Fractures: Right femur Treatments: ASSESSMENT: The BMD measured at Forearm Radius 33% is 0.721 g/cm2 with a T-score of -1.8. This patient is considered osteopenic according to [facility]) criteria. Lumbar spine was excluded due to degenerative changes. Right femur was excluded due to surgical hardware. Patient is not a candidate for FRAX due to age. Scan quality is good. Site Region Measured Measured WHO Young Adult BMD Date       Age      Classification T-score Left Femur Neck 01/31/2018 92.8 Normal -1.0 0.906 g/cm2 Left Femur Neck 10/29/2006 81.6 Osteopenia -1.2 0.867 g/cm2 Left Femur Total 01/31/2018 92.8 Normal -0.1 0.997 g/cm2 Left Femur Total [birth year]29[birth year]2006 81.6 Normal 0.3 1.042 g/cm2 Left  Forearm Radius 33% 01/31/2018 92.8 Osteopenia -1.8 0.721 g/cm2 Left Forearm Radius 33% 10[birth year]29/2006 81.6 Normal -0.7 0.812 g/cm2 World Health Organization [facility]) criteria for post-menopausal, Caucasian Women: Normal:       T-score at or above -1 SD Osteopenia:   T-score between -1 and -2.5 SD Osteoporosis: T-score at or below -2.5 SD RECOMMENDATIONS: 1. All patients should optimize calcium and vitamin D intake. 2. Consider FDA-approved medical therapies in postmenopausal women and men aged 20 years and older, based on the following: a. A hip or vertebral(clinical or morphometric) fracture b. T-score < -2.5 at the femoral neck or spine after appropriate evaluation to exclude secondary causes c. Low bone mass (T-score between -1.0 and -2.5 at the femoral neck or spine) and a 10-year probability of a hip fracture > 3% or a 10-year probability of a major osteoporosis-related fracture > 20% based on the [place]-adapted WHO algorithm d. Clinician judgment and/or patient preferences may indicate treatment for people with 10-year fracture probabilities above or below these levels FOLLOW-UP: People with diagnosed cases of osteoporosis or at high risk for fracture should have regular bone mineral density tests. For patients eligible for Medicare, routine testing is allowed once every 2 years. The testing frequency can be increased to one year for patients who have rapidly progressing disease, those who are receiving or discontinuing medical therapy to restore bone mass, or have additional risk factors. I have reviewed this report, and agree with the above findings. [facility] Electronically Signed   By: Lowella Grip III M.D.   On: 01/31/2018 10:29    Assessment & Plan:   Problem List Items Addressed This Visit      Unprioritized   Hyperlipidemia   Relevant Medications   hydrALAZINE (APRESOLINE) 25 MG tablet   Other Relevant Orders   Lipid panel (Completed)   Anemia, iron deficiency   Diabetes mellitus  type 2 in nonobese (HCC)   Relevant Medications   glipiZIDE (GLUCOTROL) 5 MG tablet   Essential hypertension    Very hard to control .  Improving with hydralazine 50 mg bid,  Encouraged to take it tid.  Continue losartan, carvedilol,  imdur , and hctz as well.       Relevant Medications   hydrALAZINE (APRESOLINE) 25 MG tablet   Chronic interstitial lung disease ([place])    She has been examined by me during episodes of wheezing and had clear lung fields, suggesting that the sounds heard by her family may be  due to VCD.  I am hesitant to give her a bronchodilator without more evidence of reversibility given her comorbid conditions of aortic stenosis and uncontrolled hypertension . Referral to Dr Patsey Berthold has been made. Chest x ray was done today and shows no acute changes      Generalized anxiety disorder    Continue sertraline at current dose       Suspected Covid-19 Virus Infection    Patient tested positive on July 2,  And she recovered uneventfully.  Infection confirmed with positive antibody test done today       Other Visit Diagnoses    Dysuria    -  Primary   Relevant Orders   Urinalysis, Routine w reflex microscopic (Completed)   Urine Culture (Completed)   Dyspnea on exertion       Relevant Orders   DG Chest 2 View (Completed)   Elevated alkaline phosphatase measurement       Dyspnea and respiratory abnormalities       History of 2019 novel coronavirus disease (COVID-19)       Hyperlipidemia associated with type 2 diabetes mellitus (HCC)       Relevant Medications   glipiZIDE (GLUCOTROL) 5 MG tablet   Chronic diastolic (congestive) heart failure (HCC)   (Chronic)     Relevant Medications   hydrALAZINE (APRESOLINE) 25 MG tablet      I have changed Maureen Morales's hydrALAZINE and glipiZIDE. I am also having her maintain her loratadine, vitamin C, Flora-Q, carvedilol, glucose blood, losartan, nitroGLYCERIN, atorvastatin, sertraline, hydrochlorothiazide, Trospium Chloride,  and isosorbide mononitrate.  Meds ordered this encounter  Medications  . hydrALAZINE (APRESOLINE) 25 MG tablet    Sig: Take 2 tablets (50 mg total) by mouth 3 (three) times daily.    Dispense:  180 tablet    Refill:  5  . glipiZIDE (GLUCOTROL) 5 MG tablet    Sig: Take 1 tablet (5 mg total) by mouth 2 (two) times daily before a meal.    Dispense:  180 tablet    Refill:  1    FOR NEXT FILL. THANK YOU    Medications Discontinued During This Encounter  Medication Reason  . hydrALAZINE (APRESOLINE) 25 MG tablet   . glipiZIDE (GLUCOTROL) 5 MG tablet Reorder    Follow-up: No follow-ups on file.   [name], MD

## 2019-01-18 LAB — URINE CULTURE
MICRO NUMBER:: 898021
SPECIMEN QUALITY:: ADEQUATE

## 2019-01-19 ENCOUNTER — Encounter: Payer: Self-pay | Admitting: Internal Medicine

## 2019-01-19 DIAGNOSIS — R350 Frequency of micturition: Secondary | ICD-10-CM | POA: Insufficient documentation

## 2019-01-19 NOTE — Assessment & Plan Note (Signed)
She has no evidence of a UTI currently. )culture is negative)

## 2019-01-19 NOTE — Assessment & Plan Note (Signed)
She has been examined by me during episodes of wheezing and had clear lung fields, suggesting that the sounds heard by her family may be due to VCD.  I am hesitant to give her a bronchodilator without more evidence of reversibility given her comorbid conditions of aortic stenosis and uncontrolled hypertension . Referral to Dr Patsey Berthold has been made. Chest x ray was done today and shows no acute changes

## 2019-01-19 NOTE — Assessment & Plan Note (Signed)
Patient tested positive on July 2,  And she recovered uneventfully.  Infection confirmed with positive antibody test done today

## 2019-01-19 NOTE — Assessment & Plan Note (Signed)
Continue sertraline at current dose

## 2019-01-19 NOTE — Assessment & Plan Note (Signed)
Control has improved with current regimen.  No changes today   Lab Results  Component Value Date   HGBA1C 7.6 (H) 01/17/2019

## 2019-01-19 NOTE — Assessment & Plan Note (Signed)
Very hard to control .  Improving with hydralazine 50 mg bid,  Encouraged to take it tid.  Continue losartan, carvedilol,  imdur , and hctz as well.

## 2019-01-20 LAB — ALKALINE PHOSPHATASE, ISOENZYMES
Alkaline Phosphatase: 110 IU/L (ref 39–117)
BONE FRACTION: 43 % (ref 14–68)
INTESTINAL FRAC.: 30 % — ABNORMAL HIGH (ref 0–18)
LIVER FRACTION: 27 % (ref 18–85)

## 2019-02-12 DIAGNOSIS — M25512 Pain in left shoulder: Secondary | ICD-10-CM | POA: Diagnosis not present

## 2019-02-12 DIAGNOSIS — M7581 Other shoulder lesions, right shoulder: Secondary | ICD-10-CM | POA: Diagnosis not present

## 2019-02-12 DIAGNOSIS — M12811 Other specific arthropathies, not elsewhere classified, right shoulder: Secondary | ICD-10-CM | POA: Diagnosis not present

## 2019-02-13 ENCOUNTER — Encounter: Payer: Self-pay | Admitting: Pulmonary Disease

## 2019-02-13 ENCOUNTER — Other Ambulatory Visit: Payer: Self-pay

## 2019-02-13 ENCOUNTER — Ambulatory Visit (INDEPENDENT_AMBULATORY_CARE_PROVIDER_SITE_OTHER): Payer: Medicare Other | Admitting: Pulmonary Disease

## 2019-02-13 VITALS — BP 150/68 | HR 75 | Temp 97.5°F | Ht 61.0 in | Wt 143.4 lb

## 2019-02-13 DIAGNOSIS — I350 Nonrheumatic aortic (valve) stenosis: Secondary | ICD-10-CM | POA: Diagnosis not present

## 2019-02-13 DIAGNOSIS — K219 Gastro-esophageal reflux disease without esophagitis: Secondary | ICD-10-CM | POA: Diagnosis not present

## 2019-02-13 DIAGNOSIS — J383 Other diseases of vocal cords: Secondary | ICD-10-CM

## 2019-02-13 MED ORDER — ALBUTEROL SULFATE HFA 108 (90 BASE) MCG/ACT IN AERS: 2.0000 | INHALATION_SPRAY | Freq: Four times a day (QID) | RESPIRATORY_TRACT | 1 refills | Status: DC | PRN

## 2019-02-13 MED ORDER — LANSOPRAZOLE 30 MG PO CPDR: 30.0000 mg | DELAYED_RELEASE_CAPSULE | Freq: Every day | ORAL | 2 refills | Status: DC

## 2019-02-13 NOTE — Progress Notes (Signed)
Subjective:    Patient ID: Maureen Morales, female    DOB: 1922/12/07, 85 y.o.   MRN: [id number]  HPI Patient is a delightful 85 year old lifelong never smoker, who presents for evaluation of perceived wheezing and shortness of breath worse since January 2020.  Patient is kindly referred by Dr. Deborra Medina.  The patient presents today with her daughter.  She has noted that since around January 2020 she developed worsening issues with wheezing that she notes more around her neck area she notices this first thing in the morning and at nighttime when laying down.  She does note that she has been having the symptoms for approximately a year altogether.  She notes that her symptoms are worse when she does mouth breathing.  She has not tried any medications to make it better.  She does endorse issues with gastroesophageal reflux and heartburn she is on Prevacid OTC but takes this only on an as-needed basis instead of consistently.  She has not had any fevers, chills or sweats. She did test positive for COVID-19 in July 2020.  Most recent COVID-19 testing was performed in September and this was negative.  She remained relatively asymptomatic during her COVID-19 illness.  As noted her symptoms proceeded the COVID-19 diagnosis.  He has a history of aortic stenosis also moderate in nature.  She has not had any orthopnea or paroxysmal nocturnal dyspnea.  Some artifact she sleeps supine with the head of the bed in a flat position which is again a position that exacerbates her symptoms.  She does not have sleep interruptions once she is asleep.  With regards to dyspnea she only notes the symptom when she has the wheezing.  Past medical history, surgical history and social history have been reviewed.  There are as noted.  Social history she is a lifelong never smoker.  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  No pets in the home.  No unusual hobbies.   Review of Systems  Constitutional: Negative.    HENT: Positive for postnasal drip.   Eyes: Negative.   Respiratory: Positive for shortness of breath (Only wheezing) and wheezing.   Cardiovascular: Negative.   Gastrointestinal:       Gastroesophageal reflux and heartburn.  Endocrine: Negative.   Genitourinary: Negative.   Musculoskeletal: Negative.   Skin: Negative.   Allergic/Immunologic: Negative.   Neurological: Negative.   Hematological: Negative.   Psychiatric/Behavioral: Negative.   All other systems reviewed and are negative.      Objective:   Physical Exam Vitals signs and nursing note reviewed.  Constitutional:      General: She is not in acute distress.    Appearance: Normal appearance. She is normal weight. She is not ill-appearing.  HENT:     Head: Normocephalic and atraumatic.     Right Ear: External ear normal.     Left Ear: External ear normal.     Nose: Nose normal.     Mouth/Throat:     Mouth: Mucous membranes are moist. No oral lesions.     Pharynx: Oropharynx is clear. Uvula midline.     Comments: When examining her posterior pharynx some pseudo-wheeze could be noted as she would inhale. Eyes:     General: No scleral icterus.    Conjunctiva/sclera: Conjunctivae normal.  Neck:     Musculoskeletal: Neck supple.     Thyroid: No thyromegaly.     Vascular: No JVD.     Trachea: Trachea and phonation normal.  Cardiovascular:  Rate and Rhythm: Normal rate and regular rhythm.  No extrasystoles are present.    Pulses: Normal pulses.     Heart sounds: Murmur (Harsh consistent with aortic stenosis) present. Systolic murmur present with a grade of 2/6.  Pulmonary:     Effort: Pulmonary effort is normal. No respiratory distress.     Breath sounds: Normal breath sounds. No wheezing (No true wheezes).     Comments: Occasional "pseudo-wheeze" Abdominal:     General: There is no distension.     Palpations: Abdomen is soft.  Musculoskeletal: Normal range of motion.     Right lower leg: No edema.     Left  lower leg: No edema.  Lymphadenopathy:     Cervical: No cervical adenopathy.  Skin:    General: Skin is warm and dry.  Neurological:     General: No focal deficit present.     Mental Status: She is alert and oriented to person, place, and time.  Psychiatric:        Mood and Affect: Mood normal.        Behavior: Behavior normal.        Assessment & Plan:   1.  Vocal cord dysfunction: Patient is exhibiting classic vocal cord dysfunction symptoms.  This may be aggravated by laryngopharyngeal reflux as noted below.  I have recommended that she be evaluated by ENT as this could be easily determined by direct laryngoscopy performed in the office setting.  She has previously seen Dr. Tami Ribas, will refer to Dr. Tami Ribas for evaluation of this issue. We will also give her a trial of albuterol for use as needed in the off chance that this may be related to airways reactivity which is less likely.  2.  Laryngopharyngeal reflux: Recommended use of Prevacid on a daily basis.  Scription sent to the patient's pharmacy.  Recommended antireflux measures.  Also recommend sleeping with either an antireflux pillow or obtain a positional bed keep the head elevated.  3.  Aortic stenosis: This appears to be mild to moderate by prior 2D echo.  She does appear to be well compensated in this regard.  We will see the patient in follow-up in 4 to 6 weeks time.  He was taught proper use of the metered-dose inhaler with spacer.  This is the use of albuterol.  Patient is to contact us prior to follow-up time should any new difficulties with her breathing arise.   Thank you for allowing [place] to participate in this patient's care.    This chart was dictated using voice recognition software/Dragon.  Despite best efforts to proofread, errors can occur which can change the meaning.  Any change was purely unintentional.

## 2019-02-13 NOTE — Patient Instructions (Signed)
1.  Recommend that you sleep on a reclined position with the head elevated.  2.  We have made a referral to Dr. Tami Ribas (ENT) for evaluation of your vocal cords.  3.  Take Prevacid before supper daily.  4.  We have provided you with spacer for inhaler use.  We will give you a trial of albuterol inhaler to see if this helps with your "wheezing".  5.  We will see him in follow-up in 4 to 6 weeks time  6.  Thank you for trusting you with your health care issues.

## 2019-02-18 DIAGNOSIS — Z23 Encounter for immunization: Secondary | ICD-10-CM | POA: Diagnosis not present

## 2019-02-26 ENCOUNTER — Encounter: Payer: Self-pay | Admitting: Pulmonary Disease

## 2019-02-26 DIAGNOSIS — R05 Cough: Secondary | ICD-10-CM | POA: Diagnosis not present

## 2019-02-26 DIAGNOSIS — H6123 Impacted cerumen, bilateral: Secondary | ICD-10-CM | POA: Diagnosis not present

## 2019-03-14 ENCOUNTER — Other Ambulatory Visit: Payer: Self-pay | Admitting: Cardiovascular Disease

## 2019-03-17 ENCOUNTER — Ambulatory Visit: Payer: Medicare Other | Admitting: Pulmonary Disease

## 2019-03-19 ENCOUNTER — Other Ambulatory Visit: Payer: Self-pay

## 2019-03-19 ENCOUNTER — Ambulatory Visit: Payer: Medicare Other | Admitting: Pulmonary Disease

## 2019-03-19 ENCOUNTER — Encounter: Payer: Self-pay | Admitting: Pulmonary Disease

## 2019-03-19 VITALS — BP 122/64 | HR 72 | Temp 97.9°F | Ht 61.0 in | Wt 142.0 lb

## 2019-03-19 DIAGNOSIS — J383 Other diseases of vocal cords: Secondary | ICD-10-CM

## 2019-03-19 DIAGNOSIS — J683 Other acute and subacute respiratory conditions due to chemicals, gases, fumes and vapors: Secondary | ICD-10-CM

## 2019-03-19 DIAGNOSIS — I350 Nonrheumatic aortic (valve) stenosis: Secondary | ICD-10-CM

## 2019-03-19 DIAGNOSIS — K219 Gastro-esophageal reflux disease without esophagitis: Secondary | ICD-10-CM

## 2019-03-19 NOTE — Patient Instructions (Signed)
1.  Do give the albuterol a try I want to see if this takes care of your wheezing.  Try 2 puffs approximately 1-2 hours before going to bed.  2.  Continue taking your antireflux medicine.   3.  We will see you in follow-up in 2 months time call sooner should any new problems arise.  Also call to report and let us know how the albuterol is working for you.

## 2019-03-25 ENCOUNTER — Other Ambulatory Visit: Payer: Self-pay | Admitting: Cardiovascular Disease

## 2019-03-25 ENCOUNTER — Other Ambulatory Visit: Payer: Self-pay | Admitting: Internal Medicine

## 2019-04-07 ENCOUNTER — Other Ambulatory Visit: Payer: Self-pay | Admitting: *Deleted

## 2019-04-07 MED ORDER — CARVEDILOL 6.25 MG PO TABS: 6.2500 mg | ORAL_TABLET | Freq: Two times a day (BID) | ORAL | 1 refills | Status: DC

## 2019-04-07 NOTE — Telephone Encounter (Signed)
Please advise If ok to refill Carvedilol 6.25 mg tablet bid. Last filled by Historical Provider.

## 2019-05-27 ENCOUNTER — Ambulatory Visit: Payer: Medicare Other | Admitting: Pulmonary Disease

## 2019-05-27 ENCOUNTER — Other Ambulatory Visit: Payer: Self-pay

## 2019-05-27 ENCOUNTER — Encounter: Payer: Self-pay | Admitting: Pulmonary Disease

## 2019-05-27 VITALS — BP 122/74 | HR 69 | Temp 97.1°F | Ht 61.0 in | Wt 144.2 lb

## 2019-05-27 DIAGNOSIS — K219 Gastro-esophageal reflux disease without esophagitis: Secondary | ICD-10-CM

## 2019-05-27 DIAGNOSIS — J383 Other diseases of vocal cords: Secondary | ICD-10-CM

## 2019-05-27 DIAGNOSIS — I350 Nonrheumatic aortic (valve) stenosis: Secondary | ICD-10-CM

## 2019-05-27 DIAGNOSIS — J683 Other acute and subacute respiratory conditions due to chemicals, gases, fumes and vapors: Secondary | ICD-10-CM

## 2019-05-27 NOTE — Patient Instructions (Addendum)
Continue the antireflux measures that you are already following.   You may substitute Pepcid for Prevacid.  You may take the Pepcid as needed for reflux.  20 mg tablet, can be found over-the-counter.   We will see you follow-up in 4 months time.  Call sooner if you have any difficulties.

## 2019-06-04 ENCOUNTER — Other Ambulatory Visit: Payer: Self-pay | Admitting: Cardiovascular Disease

## 2019-06-04 ENCOUNTER — Other Ambulatory Visit: Payer: Self-pay | Admitting: Internal Medicine

## 2019-06-04 NOTE — Telephone Encounter (Signed)
Please schedule 6 month F/U with Dr. Arida. Thank you! 

## 2019-06-04 NOTE — Telephone Encounter (Signed)
Attempted to schedule.  

## 2019-06-10 ENCOUNTER — Ambulatory Visit (INDEPENDENT_AMBULATORY_CARE_PROVIDER_SITE_OTHER): Payer: Medicare Other | Admitting: Cardiovascular Disease

## 2019-06-10 ENCOUNTER — Other Ambulatory Visit: Payer: Self-pay

## 2019-06-10 ENCOUNTER — Encounter: Payer: Self-pay | Admitting: Cardiovascular Disease

## 2019-06-10 VITALS — BP 130/78 | HR 77 | Ht 61.0 in | Wt 144.5 lb

## 2019-06-10 DIAGNOSIS — I10 Essential (primary) hypertension: Secondary | ICD-10-CM | POA: Diagnosis not present

## 2019-06-10 DIAGNOSIS — I2510 Atherosclerotic heart disease of native coronary artery without angina pectoris: Secondary | ICD-10-CM | POA: Diagnosis not present

## 2019-06-10 DIAGNOSIS — I350 Nonrheumatic aortic (valve) stenosis: Secondary | ICD-10-CM

## 2019-06-10 DIAGNOSIS — I5032 Chronic diastolic (congestive) heart failure: Secondary | ICD-10-CM

## 2019-06-10 NOTE — Patient Instructions (Signed)
Medication Instructions:  Your physician recommends that you continue on your current medications as directed. Please refer to the Current Medication list given to you today.  *If you need a refill on your cardiac medications before your next appointment, please call your pharmacy*  Lab Work: None ordered If you have labs (blood work) drawn today and your tests are completely normal, you will receive your results only by: [name] MyChart Message (if you have MyChart) OR . A paper copy in the mail If you have any lab test that is abnormal or we need to change your treatment, we will call you to review the results.  Testing/Procedures: Your physician has requested that you have an echocardiogram. Echocardiography is a painless test that uses sound waves to create images of your heart. It provides your doctor with information about the size and shape of your heart and how well your heart's chambers and valves are working. This procedure takes approximately one hour. There are no restrictions for this procedure. (To be scheduled in July 2021)  Follow-Up: At [facility], you and your health needs are our priority.  As part of our continuing mission to provide you with exceptional heart care, we have created designated Provider Care Teams.  These Care Teams include your primary Cardiologist (physician) and Advanced Practice Providers (APPs -  Physician Assistants and Nurse Practitioners) who all work together to provide you with the care you need, when you need it.  Your next appointment:   July 2021 after the echo   The format for your next appointment:   In Person  Provider:    You may see [name], MD or one of the following Advanced Practice Providers on your designated Care Team:    Murray Hodgkins, NP  Christell Faith, PA-C  Marrianne Mood, PA-C   Other Instructions  Echocardiogram An echocardiogram is a procedure that uses painless sound waves (ultrasound) to produce an image  of the heart. Images from an echocardiogram can provide important information about:  Signs of coronary artery disease (CAD).  Aneurysm detection. An aneurysm is a weak or damaged part of an artery wall that bulges out from the normal force of blood pumping through the body.  Heart size and shape. Changes in the size or shape of the heart can be associated with certain conditions, including heart failure, aneurysm, and CAD.  Heart muscle function.  Heart valve function.  Signs of a past heart attack.  Fluid buildup around the heart.  Thickening of the heart muscle.  A tumor or infectious growth around the heart valves. Tell a health care provider about:  Any allergies you have.  All medicines you are taking, including vitamins, herbs, eye drops, creams, and over-the-counter medicines.  Any blood disorders you have.  Any surgeries you have had.  Any medical conditions you have.  Whether you are pregnant or may be pregnant. What are the risks? Generally, this is a safe procedure. However, problems may occur, including:  Allergic reaction to dye (contrast) that may be used during the procedure. What happens before the procedure? No specific preparation is needed. You may eat and drink normally. What happens during the procedure?   An IV tube may be inserted into one of your veins.  You may receive contrast through this tube. A contrast is an injection that improves the quality of the pictures from your heart.  A gel will be applied to your chest.  A wand-like tool (transducer) will be moved over your chest.  The gel will help to transmit the sound waves from the transducer.  The sound waves will harmlessly bounce off of your heart to allow the heart images to be captured in real-time motion. The images will be recorded on a computer. The procedure may vary among health care providers and hospitals. What happens after the procedure?  You may return to your normal,  everyday life, including diet, activities, and medicines, unless your health care provider tells you not to do that. Summary  An echocardiogram is a procedure that uses painless sound waves (ultrasound) to produce an image of the heart.  Images from an echocardiogram can provide important information about the size and shape of your heart, heart muscle function, heart valve function, and fluid buildup around your heart.  You do not need to do anything to prepare before this procedure. You may eat and drink normally.  After the echocardiogram is completed, you may return to your normal, everyday life, unless your health care provider tells you not to do that. This information is not intended to replace advice given to you by your health care provider. Make sure you discuss any questions you have with your health care provider. Document Revised: 08/08/2018 Document Reviewed: 05/20/2016 Elsevier Patient Education  [place].

## 2019-06-10 NOTE — Progress Notes (Signed)
Cardiology Office Note   Date:  06/10/2019   ID:  [name], DOB Apr 18, 1923, MRN [id number]  PCP:  [name], MD  Cardiologist:   [name], MD   Chief Complaint  Patient presents with  . office visit    6 month F/U-Patient reports elevated BP and intermittent chest pain several weeks ago that resolved with Nitro. Patient reports "indigestion" today but has not taking lansoprazole; Meds verbally reviewed with patient.      History of Present Illness: Maureen Morales is a 86 y.o. female who presents for a follow up regarding aortic stenosis and coronary artery disease. She has chronic medical conditions that include hypertension, hyperlipidemia and type 2 diabetes.  She was hospitalized in May of 2018 with non-ST elevation myocardial infarction in the setting of uncontrolled hypertension. Troponin peaked at 5.79. Echocardiogram showed hyperdynamic LV systolic function, mild aortic stenosis with mean gradient of 14 mmHg, mild mitral regurgitation, mild pulmonary hypertension and trivial posterior pericardial effusion. Cardiac catheterization showed mild to moderate nonobstructive coronary artery disease. Worst stenosis was 60% in the mid right coronary artery. Outpatient renal artery duplex showed no evidence of renal artery stenosis. She had right hip fracture in 2018 after a fall and required another surgery after another fall.  Most recent echocardiogram in July 2020 showed normal LV systolic function.  Aortic stenosis continues to be in the moderate range with a mean gradient of 16 mmHg and valve area of 1.3 cm.  She has difficult to control blood pressure with intolerance to antihypertensive medications. Hydralazine was gradually increased by Dr. Derrel Nip and her blood pressure has been more controlled since then. She walks with a walker now since she had hip surgery.  She had one episode of chest pain few weeks ago after she was busy the whole day and her blood  pressure was elevated.  She took sublingual nitroglycerin with improvement.  Overall, this was the only time she had to take nitroglycerin since her most recent visit. She was seen by Dr. Patsey Berthold for reactive airway disease.   Past Medical History:  Diagnosis Date  . Aortic stenosis    a. 08/2016 Echo: Hyperdynamic LV fxn, mild AS (mean grad 14mmHg), mild MR, mild PAH; b. 05/2017 Echo: Hyperdynamic LV fxn, mod AS (mean grad 44mmHg, valve area 1.84). PASP 19mmHg.  . Asthma without status asthmaticus    unspecified  . Colitis    unspecified  . Complication of anesthesia    sensitive to anesthesia  . Depression   . Diabetes mellitus type 2, uncomplicated ([place])   . Essential hypertension   . GERD (gastroesophageal reflux disease)   . 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 Hyperlipidemia    unspecified  . Hypertension    a. 08/2017 Renal U/S: No RAS.  [name] Myocardial infarction ([place]) 2018  . Non-obstructive CAD (coronary artery disease)    a. 08/2016 NSTEMI/Cath: mild to mod nonobs dzs including 60% mRCA stenosis-->Med rx.  . NSTEMI (non-ST elevated myocardial infarction) ([place]) 08/30/2016  . Peripheral vascular disease ([place])   . Positive H. pylori test   . Viral gastroenteritis due to [place]-like agent Nov 2012    Past Surgical History:  Procedure Laterality Date  . ABDOMINAL HYSTERECTOMY    . BLADDER SURGERY    . CARDIAC CATHETERIZATION    . CHOLECYSTECTOMY    . COLONOSCOPY     06/16/1987, 02/20/1995, 05/04/1999, 02/14/2005, 05/17/2007  . ESOPHAGOGASTRODUODENOSCOPY  05/11/1987, 06/13/1995, 10/09/1995, 05/04/1999, 01/16/2002  . FLEXIBLE SIGMOIDOSCOPY N/A 05/02/2017   Procedure: FLEXIBLE SIGMOIDOSCOPY;  Surgeon: Manya Silvas, MD;  Location: [facility] ENDOSCOPY;  Service: Endoscopy;  Laterality: N/A;  . HEMORRHOID SURGERY N/A 05/14/2017   Procedure: EXTERNAL THROMBOSED HEMORRHOIDECTOMY;  Surgeon: Robert Bellow, MD;  Location: [facility] ORS;  Service: General;  Laterality: N/A;   . HIP ARTHROPLASTY Right 02/17/2017   Procedure: ARTHROPLASTY BIPOLAR HIP (HEMIARTHROPLASTY);  Surgeon: Claud Kelp, MD;  Location: [facility] ORS;  Service: Orthopedics;  Laterality: Right;  . LEFT HEART CATH AND CORONARY ANGIOGRAPHY N/A 08/31/2016   Procedure: Left Heart Cath and Coronary Angiography;  Surgeon: Wellington Hampshire, MD;  Location: [facility] CV LAB;  Service: Cardiovascular;  Laterality: N/A;  . ORIF PERIPROSTHETIC FRACTURE Right 05/27/2017  . ORIF PERIPROSTHETIC FRACTURE Right 05/27/2017   Procedure: OPEN REDUCTION INTERNAL FIXATION (ORIF) PERIPROSTHETIC FRACTURE;  Surgeon: Mcarthur Rossetti, MD;  Location: [facility];  Service: Orthopedics;  Laterality: Right;  . TOTAL HIP ARTHROPLASTY Right   . uterian prolapse       Current Outpatient Medications  Medication Sig Dispense Refill  . albuterol (VENTOLIN HFA) 108 (90 Base) MCG/ACT inhaler Inhale 2 puffs into the lungs every 6 (six) hours as needed for wheezing or shortness of breath. 18 g 1  . atorvastatin (LIPITOR) 40 MG tablet TAKE 1 TABLET BY MOUTH DAILY 90 tablet 1  . carvedilol (COREG) 6.25 MG tablet TAKE ONE TABLET TWICE DAILY WITH A MEAL 60 tablet 0  . FLORA-Q (FLORA-Q) CAPS capsule Take 1 capsule by mouth daily.    [name] glipiZIDE (GLUCOTROL) 5 MG tablet Take 1 tablet (5 mg total) by mouth 2 (two) times daily before a meal. 180 tablet 1  . glucose blood (BAYER CONTOUR TEST) test strip Use as instructed 100 each 5  . hydrALAZINE (APRESOLINE) 25 MG tablet Take 3 tablets (75 mg total) by mouth 3 (three) times daily. 540 tablet 1  . hydrochlorothiazide (MICROZIDE) 12.5 MG capsule TAKE 1 CAPSULE (12.5 MG) BY MOUTH EVERY DAY 30 capsule 6  . isosorbide mononitrate (IMDUR) 60 MG 24 hr tablet TAKE 1 TABLET BY MOUTH DAILY 90 tablet 0  . lansoprazole (PREVACID) 30 MG capsule Take 1 capsule (30 mg total) by mouth daily before supper. 30 capsule 2  . loratadine (CLARITIN) 10 MG tablet Take 10 mg daily by mouth.     . losartan (COZAAR)  100 MG tablet TAKE 1 TABLET BY MOUTH DAILY 90 tablet 1  . nitroGLYCERIN (NITROSTAT) 0.4 MG SL tablet Place 1 tablet (0.4 mg total) under the tongue every 5 (five) minutes as needed for chest pain. 10 tablet 2  . sertraline (ZOLOFT) 50 MG tablet TAKE 1 AND 1/2 TABLET EVERY DAY 140 tablet 1  . Trospium Chloride 60 MG CP24 TAKE ONE CAPSULE EACH MORNING BEFORE BREAKFAST 90 capsule 1  . vitamin C (ASCORBIC ACID) 500 MG tablet Take 1 tablet (500 mg total) by mouth 2 (two) times daily. 60 tablet 2   No current facility-administered medications for this visit.    Allergies:   Asa [aspirin], Aspirin, Atropine, Belladonna alkaloids, Codeine, Darvon, Darvon [propoxyphene], Demerol [meperidine], Ferrous sulfate, Meperidine and related, Methocarbamol, Penicillins, Penicillins, Sulfa antibiotics, and Iron    Social History:  The patient  reports that she has never smoked. She has never used smokeless tobacco. She reports that she does not drink alcohol or use drugs.   Family History:  The patient's family history includes Breast cancer (age of onset: 29) in  her sister; Breast cancer (age of onset: 54) in her mother; Cancer in her mother; Colon cancer in her brother, brother, and father; Stroke in her sister.    ROS:  Please see the history of present illness.   Otherwise, review of systems are positive for none.   All other systems are reviewed and negative.    PHYSICAL EXAM: VS:  BP 130/78 (BP Location: Right Arm, Patient Position: Sitting, Cuff Size: Normal)   Pulse 77   Ht 5\' 1"  (1.549 m)   Wt 144 lb 8 oz (65.5 kg)   SpO2 98%   BMI 27.30 kg/m  , BMI Body mass index is 27.3 kg/m. GEN: Well nourished, well developed, in no acute distress  HEENT: normal  Neck: no JVD, carotid bruits, or masses Cardiac: RRR; no rubs, or gallops, mild bilateral leg edema . 3/6 crescendo decrescendo systolic murmur in the aortic area which is mid peaking Respiratory:  clear to auscultation bilaterally, normal work of  breathing GI: soft, nontender, nondistended, + BS MS: no deformity or atrophy  Skin: warm and dry, no rash Neuro:  Strength and sensation are intact Psych: euthymic mood, full affect    EKG:  EKG is ordered today. The ekg ordered today demonstrates normal sinus rhythm with no significant ST or T wave changes.   Recent Labs: 01/17/2019: ALT 38; BUN 32; Creatinine, Ser 1.12; Hemoglobin 11.8; Platelets 202.0; Potassium 4.0; Pro B Natriuretic peptide (BNP) 156.0; Sodium 138; TSH 4.78    Lipid Panel    Component Value Date/Time   CHOL 145 01/17/2019 1346   TRIG 314.0 (H) 01/17/2019 1346   HDL 32.80 (L) 01/17/2019 1346   CHOLHDL 4 01/17/2019 1346   VLDL 62.8 (H) 01/17/2019 1346   LDLCALC 68 12/04/2016 0808   LDLDIRECT 67.0 01/17/2019 1346      Wt Readings from Last 3 Encounters:  06/10/19 144 lb 8 oz (65.5 kg)  05/27/19 144 lb 3.2 oz (65.4 kg)  03/19/19 142 lb (64.4 kg)       No flowsheet data found.    ASSESSMENT AND PLAN:  1.  Coronary artery disease involving native coronary arteries with stable angina: She is doing reasonably well overall with controlled symptoms.  Continue medical therapy.   Chest pain episodes are rare overall and mostly happen when her blood pressure is elevated.  Continue to monitor symptoms.  2.  Moderate aortic stenosis.  This was moderate on recent echocardiogram from last year.  I requested a repeat echocardiogram to be done in July of this year.  3.  Essential hypertension: Blood pressure is well controlled on current medications.  4.  Hyperlipidemia: Continue treatment with atorvastatin.    Disposition:   FU with me in 6 months.  Signed,  [name], MD  06/10/2019 4:14 PM    [facility]

## 2019-06-16 ENCOUNTER — Ambulatory Visit: Payer: Medicare Other | Attending: Internal Medicine

## 2019-06-16 ENCOUNTER — Other Ambulatory Visit: Payer: Self-pay

## 2019-06-16 DIAGNOSIS — Z23 Encounter for immunization: Secondary | ICD-10-CM

## 2019-06-16 NOTE — Progress Notes (Signed)
   Covid-19 Vaccination Clinic  Name:  Maureen Morales    MRN: [id number] DOB: [birth year]  06/16/2019  [name] was observed post Covid-19 immunization for 15 minutes without incidence. She was provided with Vaccine Information Sheet and instruction to access the V-Safe system.   [name] was instructed to call 911 with any severe reactions post vaccine: [name] Difficulty breathing  . Swelling of your face and throat  . A fast heartbeat  . A bad rash all over your body  . Dizziness and weakness    Immunizations Administered    Name Date Dose VIS Date Route   Pfizer COVID-19 Vaccine 06/16/2019  9:54 AM 0.3 mL 04/11/2019 Intramuscular   Manufacturer: [place]   Lot: [id number]   [place]: [id number]

## 2019-06-18 ENCOUNTER — Other Ambulatory Visit: Payer: Self-pay | Admitting: Internal Medicine

## 2019-07-01 ENCOUNTER — Other Ambulatory Visit: Payer: Self-pay | Admitting: Cardiovascular Disease

## 2019-07-15 ENCOUNTER — Ambulatory Visit: Payer: Medicare Other | Attending: Internal Medicine

## 2019-07-15 DIAGNOSIS — Z23 Encounter for immunization: Secondary | ICD-10-CM

## 2019-07-15 NOTE — Progress Notes (Signed)
   Covid-19 Vaccination Clinic  Name:  Maureen Morales    MRN: [id number] DOB: Apr 18, 1923  07/15/2019  Maureen Morales was observed post Covid-19 immunization for 15 minutes without incident. She was provided with Vaccine Information Sheet and instruction to access the V-Safe system.   Maureen Morales was instructed to call 911 with any severe reactions post vaccine: [name] Difficulty breathing  . Swelling of face and throat  . A fast heartbeat  . A bad rash all over body  . Dizziness and weakness   Immunizations Administered    Name Date Dose VIS Date Route   Pfizer COVID-19 Vaccine 07/15/2019 10:02 AM 0.3 mL 04/11/2019 Intramuscular   Manufacturer: [place]   Lot: [id number]   [place]: [id number]

## 2019-07-16 ENCOUNTER — Encounter: Payer: Self-pay | Admitting: Internal Medicine

## 2019-07-16 ENCOUNTER — Ambulatory Visit (INDEPENDENT_AMBULATORY_CARE_PROVIDER_SITE_OTHER): Payer: Medicare Other | Admitting: Internal Medicine

## 2019-07-16 ENCOUNTER — Ambulatory Visit (INDEPENDENT_AMBULATORY_CARE_PROVIDER_SITE_OTHER): Payer: Medicare Other

## 2019-07-16 ENCOUNTER — Other Ambulatory Visit: Payer: Self-pay

## 2019-07-16 VITALS — BP 146/70 | HR 80 | Temp 96.7°F | Resp 15 | Ht 61.0 in | Wt 139.2 lb

## 2019-07-16 DIAGNOSIS — E7849 Other hyperlipidemia: Secondary | ICD-10-CM

## 2019-07-16 DIAGNOSIS — E1121 Type 2 diabetes mellitus with diabetic nephropathy: Secondary | ICD-10-CM | POA: Diagnosis not present

## 2019-07-16 DIAGNOSIS — R7401 Elevation of levels of liver transaminase levels: Secondary | ICD-10-CM | POA: Diagnosis not present

## 2019-07-16 DIAGNOSIS — R0789 Other chest pain: Secondary | ICD-10-CM

## 2019-07-16 DIAGNOSIS — D500 Iron deficiency anemia secondary to blood loss (chronic): Secondary | ICD-10-CM

## 2019-07-16 DIAGNOSIS — I10 Essential (primary) hypertension: Secondary | ICD-10-CM

## 2019-07-16 DIAGNOSIS — N1831 Chronic kidney disease, stage 3a: Secondary | ICD-10-CM

## 2019-07-16 DIAGNOSIS — R0781 Pleurodynia: Secondary | ICD-10-CM

## 2019-07-16 DIAGNOSIS — I2510 Atherosclerotic heart disease of native coronary artery without angina pectoris: Secondary | ICD-10-CM | POA: Diagnosis not present

## 2019-07-16 LAB — CBC WITH DIFFERENTIAL/PLATELET
Basophils Absolute: 0.1 10*3/uL (ref 0.0–0.1)
Basophils Relative: 0.6 % (ref 0.0–3.0)
Eosinophils Absolute: 0.2 10*3/uL (ref 0.0–0.7)
Eosinophils Relative: 2.4 % (ref 0.0–5.0)
HCT: 35.1 % — ABNORMAL LOW (ref 36.0–46.0)
Hemoglobin: 11.9 g/dL — ABNORMAL LOW (ref 12.0–15.0)
Lymphocytes Relative: 8.7 % — ABNORMAL LOW (ref 12.0–46.0)
Lymphs Abs: 0.7 10*3/uL (ref 0.7–4.0)
MCHC: 33.8 g/dL (ref 30.0–36.0)
MCV: 85 fl (ref 78.0–100.0)
Monocytes Absolute: 0.6 10*3/uL (ref 0.1–1.0)
Monocytes Relative: 7.4 % (ref 3.0–12.0)
Neutro Abs: 6.7 10*3/uL (ref 1.4–7.7)
Neutrophils Relative %: 80.9 % — ABNORMAL HIGH (ref 43.0–77.0)
Platelets: 184 10*3/uL (ref 150.0–400.0)
RBC: 4.13 Mil/uL (ref 3.87–5.11)
RDW: 13.7 % (ref 11.5–15.5)
WBC: 8.3 10*3/uL (ref 4.0–10.5)

## 2019-07-16 LAB — COMPREHENSIVE METABOLIC PANEL
ALT: 46 U/L — ABNORMAL HIGH (ref 0–35)
AST: 29 U/L (ref 0–37)
Albumin: 4 g/dL (ref 3.5–5.2)
Alkaline Phosphatase: 94 U/L (ref 39–117)
BUN: 34 mg/dL — ABNORMAL HIGH (ref 6–23)
CO2: 24 mEq/L (ref 19–32)
Calcium: 9 mg/dL (ref 8.4–10.5)
Chloride: 100 mEq/L (ref 96–112)
Creatinine, Ser: 1.17 mg/dL (ref 0.40–1.20)
GFR: 43.04 mL/min — ABNORMAL LOW (ref >60.00)
Glucose, Bld: 281 mg/dL — ABNORMAL HIGH (ref 70–99)
Potassium: 4.3 mEq/L (ref 3.5–5.1)
Sodium: 132 mEq/L — ABNORMAL LOW (ref 135–145)
Total Bilirubin: 0.4 mg/dL (ref 0.2–1.2)
Total Protein: 6.9 g/dL (ref 6.0–8.3)

## 2019-07-16 LAB — LDL CHOLESTEROL, DIRECT: Direct LDL: 48 mg/dL

## 2019-07-16 LAB — CK: Total CK: 74 U/L (ref 7–177)

## 2019-07-16 LAB — HEMOGLOBIN A1C: Hgb A1c MFr Bld: 8 % — ABNORMAL HIGH (ref 4.6–6.5)

## 2019-07-16 NOTE — Progress Notes (Signed)
Subjective:  Patient ID: Maureen Morales, female    DOB: [birth year]  Age: 86 y.o. MRN: [id number]  CC: The primary encounter diagnosis was Rib pain on left side. Diagnoses of Type II diabetes mellitus with nephropathy ([place]), Other hyperlipidemia, Elevated ALT measurement, Essential hypertension, Iron deficiency anemia due to chronic blood loss, Stage 3a chronic kidney disease, and Left-sided chest wall pain were also pertinent to this visit.  HPI BRITTANYANN ELIZARDO presents for new onset left sided lateral chest wall pain  This visit occurred during the SARS-CoV-2 public health emergency.  Safety protocols were in place, including screening questions prior to the visit, additional usage of staff PPE, and extensive cleaning of exam room while observing appropriate contact time as indicated for disinfecting solutions.   Left sided posterolateral  chest wall  pain intermittent,  Sharp, occurring for the last 4 days , aggravated by picking up a large platter of food on Sunday .  Has not been taking any tylenol because she believes that  her pharmacist told her daughter not to take tylenol (this was ruled out with a phone call to the pharmacist)  .  Had a prolonged episode of vomiting 6 days ago ( one day prior to onset of pain ) and had some unusual physical activity recently wile visiting with her grandchildren.  She recalls that she was pushing her grandchild who was seated in a rolling play fire truck  (patient was pushing from a bent over position  In her wheelchair )   2) follow up on type 2 DM .  Patient has been indulging a bit in desserts and candy.  Does not exercise due to history of recurrent falls and fractures to hip   3) HTN:  She continues to have labile HTN and has been using more hydralazine  To control the fluctuations.   Outpatient Medications Prior to Visit  Medication Sig Dispense Refill  . albuterol (VENTOLIN HFA) 108 (90 Base) MCG/ACT inhaler Inhale 2 puffs into the lungs every 6  (six) hours as needed for wheezing or shortness of breath. 18 g 1  . atorvastatin (LIPITOR) 40 MG tablet TAKE 1 TABLET BY MOUTH DAILY 90 tablet 1  . carvedilol (COREG) 6.25 MG tablet TAKE ONE TABLET TWICE DAILY WITH A MEAL 60 tablet 2  . FLORA-Q (FLORA-Q) CAPS capsule Take 1 capsule by mouth daily.    [name] glipiZIDE (GLUCOTROL) 5 MG tablet Take 1 tablet (5 mg total) by mouth 2 (two) times daily before a meal. 180 tablet 1  . glucose blood (BAYER CONTOUR TEST) test strip Use as instructed 100 each 5  . hydrALAZINE (APRESOLINE) 25 MG tablet Take 3 tablets (75 mg total) by mouth 3 (three) times daily. 540 tablet 1  . hydrochlorothiazide (MICROZIDE) 12.5 MG capsule TAKE 1 CAPSULE BY MOUTH EVERY DAY 30 capsule 6  . isosorbide mononitrate (IMDUR) 60 MG 24 hr tablet TAKE 1 TABLET BY MOUTH DAILY 90 tablet 0  . lansoprazole (PREVACID) 30 MG capsule Take 1 capsule (30 mg total) by mouth daily before supper. 30 capsule 2  . loratadine (CLARITIN) 10 MG tablet Take 10 mg daily by mouth.     . losartan (COZAAR) 100 MG tablet TAKE ONE TABLET BY MOUTH EVERY DAY 90 tablet 1  . nitroGLYCERIN (NITROSTAT) 0.4 MG SL tablet Place 1 tablet (0.4 mg total) under the tongue every 5 (five) minutes as needed for chest pain. 10 tablet 2  . sertraline (ZOLOFT) 50 MG tablet TAKE 1 AND  1/2 TABLET EVERY DAY 140 tablet 1  . Trospium Chloride 60 MG CP24 TAKE ONE CAPSULE EACH MORNING BEFORE BREAKFAST 90 capsule 1  . vitamin C (ASCORBIC ACID) 500 MG tablet Take 1 tablet (500 mg total) by mouth 2 (two) times daily. 60 tablet 2   No facility-administered medications prior to visit.    Review of Systems;  Patient denies headache, fevers, malaise, unintentional weight loss, skin rash, eye pain, sinus congestion and sinus pain, sore throat, dysphagia,  hemoptysis , cough, dyspnea, wheezing, chest pain, palpitations, orthopnea, edema, abdominal pain, nausea, melena, diarrhea, constipation, flank pain, dysuria, hematuria, urinary   Frequency, nocturia, numbness, tingling, seizures,  Focal weakness, Loss of consciousness,  Tremor, insomnia, depression, anxiety, and suicidal ideation.      Objective:  BP (!) 146/70 (BP Location: Left Arm, Patient Position: Sitting, Cuff Size: Normal)   Pulse 80   Temp (!) 96.7 F (35.9 C) (Temporal)   Resp 15   Ht 5\' 1"  (1.549 m)   Wt 139 lb 3.2 oz (63.1 kg)   SpO2 97%   BMI 26.30 kg/m   BP Readings from Last 3 Encounters:  07/16/19 (!) 146/70  06/10/19 130/78  05/27/19 122/74    Wt Readings from Last 3 Encounters:  07/16/19 139 lb 3.2 oz (63.1 kg)  06/10/19 144 lb 8 oz (65.5 kg)  05/27/19 144 lb 3.2 oz (65.4 kg)    General appearance: alert, cooperative and appears stated age Ears: normal TM's and external ear canals both ears Throat: lips, mucosa, and tongue normal; teeth and gums normal Neck: no adenopathy, no carotid bruit, supple, symmetrical, trachea midline and thyroid not enlarged, symmetric, no tenderness/mass/nodules Back: symmetric, no curvature. ROM normal. No CVA tenderness. Lungs: clear to auscultation bilaterally Heart: regular rate and rhythm, S1, S2 normal, no murmur, click, rub or gallop Abdomen: soft, non-tender; bowel sounds normal; no masses,  no organomegaly Pulses: 2+ and symmetric Skin: Skin color, texture, turgor normal. No rashes or lesions Lymph nodes: Cervical, supraclavicular, and axillary nodes normal.  Lab Results  Component Value Date   HGBA1C 8.0 (H) 07/16/2019   HGBA1C 7.6 (H) 01/17/2019   HGBA1C 9.7 (H) 09/30/2018    Lab Results  Component Value Date   CREATININE 1.17 07/16/2019   CREATININE 1.12 01/17/2019   CREATININE 1.09 09/30/2018    Lab Results  Component Value Date   WBC 8.3 07/16/2019   HGB 11.9 (L) 07/16/2019   HCT 35.1 (L) 07/16/2019   PLT 184.0 07/16/2019   GLUCOSE 281 (H) 07/16/2019   CHOL 145 01/17/2019   TRIG 314.0 (H) 01/17/2019   HDL 32.80 (L) 01/17/2019   LDLDIRECT 48.0 07/16/2019   LDLCALC 68  12/04/2016   ALT 46 (H) 07/16/2019   AST 29 07/16/2019   NA 132 (L) 07/16/2019   K 4.3 07/16/2019   CL 100 07/16/2019   CREATININE 1.17 07/16/2019   BUN 34 (H) 07/16/2019   CO2 24 07/16/2019   TSH 4.78 (H) 01/17/2019   INR 1.26 05/26/2017   HGBA1C 8.0 (H) 07/16/2019   MICROALBUR 30.9 (H) 07/06/2016    DG Bone Density  Result Date: 01/31/2018 EXAM: DUAL X-RAY ABSORPTIOMETRY (DXA) FOR BONE MINERAL DENSITY IMPRESSION: Technologist: KBD PATIENT BIOGRAPHICAL: Name: Jolisha, Mandrell Patient ID: [id number] Birth Date: Mar 21, 1923 Height: 60.0 in. Gender: Female Exam Date: 01/31/2018 Weight: 133.5 lbs. Indications: Advanced Age, Caucasian, History of Fracture (Adult), Hysterectomy, Postmenopausal, right hip replacement Fractures: Right femur Treatments: ASSESSMENT: The BMD measured at Forearm Radius 33% is 0.721 g/cm2  with a T-score of -1.8. This patient is considered osteopenic according to [facility] criteria. Lumbar spine was excluded due to degenerative changes. Right femur was excluded due to surgical hardware. Patient is not a candidate for FRAX due to age. Scan quality is good. Site Region Measured Measured WHO Young Adult BMD Date       Age      Classification T-score Left Femur Neck 01/31/2018 92.8 Normal -1.0 0.906 g/cm2 Left Femur Neck 10/29/2006 81.6 Osteopenia -1.2 0.867 g/cm2 Left Femur Total 01/31/2018 92.8 Normal -0.1 0.997 g/cm2 Left Femur Total 10[birth year]29/2006 81.6 Normal 0.3 1.042 g/cm2 Left Forearm Radius 33% 01/31/2018 92.8 Osteopenia -1.8 0.721 g/cm2 Left Forearm Radius 33% 10/29/2006 81.6 Normal -0.7 0.812 g/cm2 World Health Organization [facility]) criteria for post-menopausal, Caucasian Women: Normal:       T-score at or above -1 SD Osteopenia:   T-score between -1 and -2.5 SD Osteoporosis: T-score at or below -2.5 SD RECOMMENDATIONS: 1. All patients should optimize calcium and vitamin D intake. 2. Consider FDA-approved medical therapies in postmenopausal women and men aged 38  years and older, based on the following: a. A hip or vertebral(clinical or morphometric) fracture b. T-score < -2.5 at the femoral neck or spine after appropriate evaluation to exclude secondary causes c. Low bone mass (T-score between -1.0 and -2.5 at the femoral neck or spine) and a 10-year probability of a hip fracture > 3% or a 10-year probability of a major osteoporosis-related fracture > 20% based on the [place]-adapted WHO algorithm d. Clinician judgment and/or patient preferences may indicate treatment for people with 10-year fracture probabilities above or below these levels FOLLOW-UP: People with diagnosed cases of osteoporosis or at high risk for fracture should have regular bone mineral density tests. For patients eligible for Medicare, routine testing is allowed once every 2 years. The testing frequency can be increased to one year for patients who have rapidly progressing disease, those who are receiving or discontinuing medical therapy to restore bone mass, or have additional risk factors. I have reviewed this report, and agree with the above findings. [facility]iology Electronically Signed   By: Lowella Grip III M.D.   On: 01/31/2018 10:29    Assessment & Plan:   Problem List Items Addressed This Visit      Unprioritized   Essential hypertension    Labile despite regular use of  losartan, carvedilol,  imdur and hctz . Likely aggravated by aortic stenosis and anxiety   Continue increased dose of hydralazine 75 mg tid.       Type II diabetes mellitus with nephropathy (HCC)    Loss of control noted.  Dietary modifications recommended.  Will avoid increasing dose of glipizide given her age.   Lab Results  Component Value Date   HGBA1C 8.0 (H) 07/16/2019         Relevant Orders   Comprehensive metabolic panel (Completed)   Hemoglobin A1c (Completed)   Hyperlipidemia    LDL is at goal on atorvastatin  Lab Results  Component Value Date   CHOL 145 01/17/2019   HDL 32.80 (L)  01/17/2019   LDLCALC 68 12/04/2016   LDLDIRECT 48.0 07/16/2019   TRIG 314.0 (H) 01/17/2019   CHOLHDL 4 01/17/2019         Relevant Orders   Direct LDL (Completed)   Anemia, iron deficiency    Hemoglobin is stable  Lab Results  Component Value Date   WBC 8.3 07/16/2019   HGB 11.9 (L) 07/16/2019  HCT 35.1 (L) 07/16/2019   MCV 85.0 07/16/2019   PLT 184.0 07/16/2019   .lastiron      Stage 3 chronic kidney disease    Stable.  She is avoiding NSAIDs and nephrotoxic medications  Lab Results  Component Value Date   CREATININE 1.17 07/16/2019        Left-sided chest wall pain    She has no evidence of rib fracture or pneumonia on films done today.  Muscle strain most likely given state of deconditioning.  Supportive care advised      Elevated ALT measurement    Suspect fatty liver, will repeat in 3 weeks,, R UQ ultrasound recommended       Relevant Orders   US Abdomen Limited RUQ   Hepatic function panel    Other Visit Diagnoses    Rib pain on left side    -  Primary   Relevant Orders   DG Ribs Unilateral Left (Completed)   CK (Creatine Kinase) (Completed)   DG Chest 2 View (Completed)   CBC with Differential/Platelet (Completed)      I am having Maureen Morales maintain her loratadine, vitamin C, Flora-Q, glucose blood, nitroGLYCERIN, sertraline, Trospium Chloride, glipiZIDE, lansoprazole, albuterol, atorvastatin, hydrALAZINE, hydrochlorothiazide, losartan, isosorbide mononitrate, and carvedilol.  No orders of the defined types were placed in this encounter.   There are no discontinued medications.  Follow-up: No follow-ups on file.   [name], MD

## 2019-07-16 NOTE — Patient Instructions (Signed)
You can take one motrin (200 mg)  and 1 tylenol (500 mg ) every 6 hours for the pain  Ice the area for 15 minutes every 6 hours   Make sure you take 10 deep breaths every hour while you are awake

## 2019-07-17 DIAGNOSIS — R0789 Other chest pain: Secondary | ICD-10-CM | POA: Insufficient documentation

## 2019-07-17 DIAGNOSIS — R079 Chest pain, unspecified: Secondary | ICD-10-CM | POA: Insufficient documentation

## 2019-07-17 DIAGNOSIS — R7401 Elevation of levels of liver transaminase levels: Secondary | ICD-10-CM | POA: Insufficient documentation

## 2019-07-17 NOTE — Assessment & Plan Note (Signed)
Suspect fatty liver, will repeat in 3 weeks,, R UQ ultrasound recommended

## 2019-07-17 NOTE — Assessment & Plan Note (Addendum)
Labile despite regular use of  losartan, carvedilol,  imdur and hctz . Likely aggravated by aortic stenosis and anxiety   Continue increased dose of hydralazine 75 mg tid.

## 2019-07-17 NOTE — Assessment & Plan Note (Signed)
Hemoglobin is stable  Lab Results  Component Value Date   WBC 8.3 07/16/2019   HGB 11.9 (L) 07/16/2019   HCT 35.1 (L) 07/16/2019   MCV 85.0 07/16/2019   PLT 184.0 07/16/2019   .lastiron

## 2019-07-17 NOTE — Assessment & Plan Note (Signed)
She has no evidence of rib fracture or pneumonia on films done today.  Muscle strain most likely given state of deconditioning.  Supportive care advised

## 2019-07-17 NOTE — Assessment & Plan Note (Signed)
Loss of control noted.  Dietary modifications recommended.  Will avoid increasing dose of glipizide given her age.   Lab Results  Component Value Date   HGBA1C 8.0 (H) 07/16/2019

## 2019-07-17 NOTE — Assessment & Plan Note (Signed)
LDL is at goal on atorvastatin  Lab Results  Component Value Date   CHOL 145 01/17/2019   HDL 32.80 (L) 01/17/2019   LDLCALC 68 12/04/2016   LDLDIRECT 48.0 07/16/2019   TRIG 314.0 (H) 01/17/2019   CHOLHDL 4 01/17/2019

## 2019-07-17 NOTE — Assessment & Plan Note (Signed)
Stable.  She is avoiding NSAIDs and nephrotoxic medications  Lab Results  Component Value Date   CREATININE 1.17 07/16/2019

## 2019-07-23 ENCOUNTER — Ambulatory Visit: Payer: Medicare Other

## 2019-07-23 ENCOUNTER — Ambulatory Visit
Admission: RE | Admit: 2019-07-23 | Discharge: 2019-07-23 | Disposition: A | Discharge status: Home / Self Care | Payer: Medicare Other | Source: Ambulatory Visit | Attending: Internal Medicine | Admitting: Internal Medicine

## 2019-07-23 ENCOUNTER — Other Ambulatory Visit: Payer: Self-pay

## 2019-07-23 DIAGNOSIS — R7401 Elevation of levels of liver transaminase levels: Secondary | ICD-10-CM | POA: Diagnosis present

## 2019-07-24 ENCOUNTER — Other Ambulatory Visit: Payer: Self-pay | Admitting: Internal Medicine

## 2019-07-24 DIAGNOSIS — E871 Hypo-osmolality and hyponatremia: Secondary | ICD-10-CM

## 2019-07-27 ENCOUNTER — Encounter: Payer: Self-pay | Admitting: Internal Medicine

## 2019-07-27 ENCOUNTER — Other Ambulatory Visit: Payer: Self-pay | Admitting: Internal Medicine

## 2019-08-12 ENCOUNTER — Other Ambulatory Visit: Payer: Medicare Other

## 2019-08-12 ENCOUNTER — Other Ambulatory Visit (INDEPENDENT_AMBULATORY_CARE_PROVIDER_SITE_OTHER): Payer: Medicare Other

## 2019-08-12 ENCOUNTER — Other Ambulatory Visit: Payer: Self-pay

## 2019-08-12 ENCOUNTER — Other Ambulatory Visit: Payer: Self-pay | Admitting: Internal Medicine

## 2019-08-12 DIAGNOSIS — R7401 Elevation of levels of liver transaminase levels: Secondary | ICD-10-CM | POA: Diagnosis not present

## 2019-08-12 DIAGNOSIS — E871 Hypo-osmolality and hyponatremia: Secondary | ICD-10-CM | POA: Diagnosis not present

## 2019-08-12 LAB — BASIC METABOLIC PANEL
BUN: 37 mg/dL — ABNORMAL HIGH (ref 6–23)
CO2: 24 mEq/L (ref 19–32)
Calcium: 9.2 mg/dL (ref 8.4–10.5)
Chloride: 102 mEq/L (ref 96–112)
Creatinine, Ser: 1.09 mg/dL (ref 0.40–1.20)
GFR: 46.7 mL/min — ABNORMAL LOW (ref >60.00)
Glucose, Bld: 215 mg/dL — ABNORMAL HIGH (ref 70–99)
Potassium: 3.9 mEq/L (ref 3.5–5.1)
Sodium: 134 mEq/L — ABNORMAL LOW (ref 135–145)

## 2019-08-12 LAB — HEPATIC FUNCTION PANEL
ALT: 36 U/L — ABNORMAL HIGH (ref 0–35)
AST: 25 U/L (ref 0–37)
Albumin: 4.3 g/dL (ref 3.5–5.2)
Alkaline Phosphatase: 128 U/L — ABNORMAL HIGH (ref 39–117)
Bilirubin, Direct: 0 mg/dL (ref 0.0–0.3)
Total Bilirubin: 0.4 mg/dL (ref 0.2–1.2)
Total Protein: 6.8 g/dL (ref 6.0–8.3)

## 2019-08-13 ENCOUNTER — Other Ambulatory Visit: Payer: Self-pay | Admitting: Internal Medicine

## 2019-08-13 DIAGNOSIS — R748 Abnormal levels of other serum enzymes: Secondary | ICD-10-CM

## 2019-08-13 NOTE — Progress Notes (Signed)
Her liver enzyme elevation is improving. But not yet normal I would like to repeat it in one month.  Labs ordered

## 2019-08-19 ENCOUNTER — Telehealth: Payer: Self-pay | Admitting: Internal Medicine

## 2019-08-19 NOTE — Telephone Encounter (Signed)
Maureen Morales is due for a tetanus booster,  Her last one was 2010. This can be done here or at her pharmacy,  And should be done giene her recent dog bite.

## 2019-08-20 NOTE — Telephone Encounter (Signed)
Pt received vaccine at [facility] pharmacy.

## 2019-09-02 ENCOUNTER — Other Ambulatory Visit: Payer: Self-pay | Admitting: Cardiovascular Disease

## 2019-09-15 ENCOUNTER — Other Ambulatory Visit (INDEPENDENT_AMBULATORY_CARE_PROVIDER_SITE_OTHER): Payer: Medicare Other

## 2019-09-15 ENCOUNTER — Other Ambulatory Visit: Payer: Self-pay

## 2019-09-15 DIAGNOSIS — R748 Abnormal levels of other serum enzymes: Secondary | ICD-10-CM

## 2019-09-15 LAB — COMPREHENSIVE METABOLIC PANEL
ALT: 29 U/L (ref 0–35)
AST: 22 U/L (ref 0–37)
Albumin: 4.1 g/dL (ref 3.5–5.2)
Alkaline Phosphatase: 149 U/L — ABNORMAL HIGH (ref 39–117)
BUN: 45 mg/dL — ABNORMAL HIGH (ref 6–23)
CO2: 22 mEq/L (ref 19–32)
Calcium: 9 mg/dL (ref 8.4–10.5)
Chloride: 104 mEq/L (ref 96–112)
Creatinine, Ser: 1.21 mg/dL — ABNORMAL HIGH (ref 0.40–1.20)
GFR: 41.39 mL/min — ABNORMAL LOW (ref >60.00)
Glucose, Bld: 287 mg/dL — ABNORMAL HIGH (ref 70–99)
Potassium: 4.1 mEq/L (ref 3.5–5.1)
Sodium: 134 mEq/L — ABNORMAL LOW (ref 135–145)
Total Bilirubin: 0.3 mg/dL (ref 0.2–1.2)
Total Protein: 6.6 g/dL (ref 6.0–8.3)

## 2019-09-15 LAB — GAMMA GT: GGT: 30 U/L (ref 7–51)

## 2019-09-16 ENCOUNTER — Other Ambulatory Visit: Payer: Self-pay | Admitting: Internal Medicine

## 2019-09-17 LAB — HM DIABETES EYE EXAM

## 2019-09-19 LAB — ALKALINE PHOSPHATASE, ISOENZYMES
Alkaline Phosphatase: 177 IU/L — ABNORMAL HIGH (ref 48–121)
BONE FRACTION: 22 % (ref 14–68)
INTESTINAL FRAC.: 61 % — ABNORMAL HIGH (ref 0–18)
LIVER FRACTION: 17 % — ABNORMAL LOW (ref 18–85)

## 2019-09-26 ENCOUNTER — Other Ambulatory Visit: Payer: Self-pay | Admitting: Internal Medicine

## 2019-09-26 DIAGNOSIS — L03031 Cellulitis of right toe: Secondary | ICD-10-CM | POA: Insufficient documentation

## 2019-09-26 MED ORDER — CEPHALEXIN 500 MG PO CAPS
500.0000 mg | ORAL_CAPSULE | Freq: Four times a day (QID) | ORAL | 0 refills | Status: DC
Start: 2019-09-26 — End: 2020-01-06

## 2019-09-26 NOTE — Assessment & Plan Note (Signed)
Noted during recent pedicure,   Sending Cephalexin ,  Sending cephalexin to [facility] pharmacy

## 2019-09-29 ENCOUNTER — Other Ambulatory Visit: Payer: Self-pay | Admitting: Internal Medicine

## 2019-09-29 ENCOUNTER — Other Ambulatory Visit: Payer: Self-pay | Admitting: Cardiovascular Disease

## 2019-10-02 ENCOUNTER — Ambulatory Visit (INDEPENDENT_AMBULATORY_CARE_PROVIDER_SITE_OTHER): Payer: Medicare Other | Admitting: Pulmonary Disease

## 2019-10-02 ENCOUNTER — Other Ambulatory Visit: Payer: Self-pay

## 2019-10-02 ENCOUNTER — Encounter: Payer: Self-pay | Admitting: Pulmonary Disease

## 2019-10-02 VITALS — BP 140/70 | HR 74 | Temp 97.3°F | Ht 61.0 in | Wt 140.0 lb

## 2019-10-02 DIAGNOSIS — J683 Other acute and subacute respiratory conditions due to chemicals, gases, fumes and vapors: Secondary | ICD-10-CM | POA: Diagnosis not present

## 2019-10-02 DIAGNOSIS — I350 Nonrheumatic aortic (valve) stenosis: Secondary | ICD-10-CM

## 2019-10-02 DIAGNOSIS — J383 Other diseases of vocal cords: Secondary | ICD-10-CM | POA: Diagnosis not present

## 2019-10-02 DIAGNOSIS — K219 Gastro-esophageal reflux disease without esophagitis: Secondary | ICD-10-CM

## 2019-10-02 NOTE — Progress Notes (Signed)
Subjective:    Patient ID: Maureen Morales, female    DOB: 1923-04-25, 86 y.o.   MRN: [id number]  HPI Maureen Morales is a 86 year old never smoker who follows here for the issue of dyspnea and wheezing.  Patient states that her wheezing only occurs when she lays down or when she is tired.  Otherwise she has had no difficulties.  Her dyspnea is actually resolved.  She does have issues with aortic stenosis for which she follows with Dr. Fletcher Morales.  This has been classified as moderate and has been stable.  He has not had any fevers, chills or sweats.  No cough or sputum production.  No chest pain, paroxysmal nocturnal dyspnea orthopnea.  No lower extremity edema or calf tenderness.  He does not endorse any other complaints.  She feels well and looks well.   Review of Systems A 10 point review of systems was performed and it is as noted above otherwise negative.  Immunization History  Administered Date(s) Administered  . Influenza Split 02/01/2011  . Influenza, High Dose Seasonal PF 02/27/2018, 02/18/2019  . Influenza, Seasonal, Injecte, Preservative Fre 02/14/2017  . Influenza,inj,Quad PF,6+ Mos 01/08/2013, 12/28/2014  . Influenza-Unspecified 01/30/2012, 01/29/2014, 01/25/2016  . PFIZER SARS-COV-2 Vaccination 06/16/2019, 07/15/2019  . Pneumococcal Conjugate-13 07/31/2013, 09/07/2014  . Pneumococcal Polysaccharide-23 08/01/2006, 02/18/2017  . Tdap 05/17/2008, 08/20/2019  . Zoster 11/14/2010   Allergies  Allergen Reactions  . Asa [Aspirin] Anaphylaxis  . Aspirin Swelling  . Atropine Other (See Comments)    Reaction: unknown  . Belladonna Alkaloids Other (See Comments)    Reaction: unknown  . Codeine Other (See Comments)    Reaction: unknown  . Darvon Other (See Comments)    Reaction: unknown  . Darvon [Propoxyphene] Nausea And Vomiting  . Demerol [Meperidine] Nausea And Vomiting  . Ferrous Sulfate Diarrhea  . Meperidine And Related   . Methocarbamol Other (See Comments)    Reaction:  unknown  . Penicillins Other (See Comments)    Reaction: unknown Has patient had a PCN reaction causing immediate rash, facial/tongue/throat swelling, SOB or lightheadedness with hypotension: Unknown Has patient had a PCN reaction causing severe rash involving mucus membranes or skin necrosis: Unknown Has patient had a PCN reaction that required hospitalization: Unknown Has patient had a PCN reaction occurring within the last 10 years: Unknown If all of the above answers are "NO", then may proceed with Cephalosporin use.   [name] Penicillins Other (See Comments)    Pt and family unsure of details  . Sulfa Antibiotics Other (See Comments)    Reaction: unknown  . Iron Other (See Comments)    High dose prescription gives her diarrhea   Current Meds  Medication Sig  . albuterol (VENTOLIN HFA) 108 (90 Base) MCG/ACT inhaler Inhale 2 puffs into the lungs every 6 (six) hours as needed for wheezing or shortness of breath.  [name] atorvastatin (LIPITOR) 40 MG tablet TAKE 1 TABLET BY MOUTH DAILY  . carvedilol (COREG) 6.25 MG tablet TAKE ONE TABLET TWICE DAILY WITH A MEAL  . cephALEXin (KEFLEX) 500 MG capsule Take 1 capsule (500 mg total) by mouth 4 (four) times daily.  [name] FLORA-Q (FLORA-Q) CAPS capsule Take 1 capsule by mouth daily.  [name] glipiZIDE (GLUCOTROL) 5 MG tablet Take 1 tablet (5 mg total) by mouth 2 (two) times daily before a meal.  . glucose blood (BAYER CONTOUR TEST) test strip Use as instructed  . hydrALAZINE (APRESOLINE) 25 MG tablet Take 3 tablets (75 mg total) by mouth 3 (  three) times daily.  . hydrochlorothiazide (MICROZIDE) 12.5 MG capsule TAKE 1 CAPSULE BY MOUTH EVERY DAY  . isosorbide mononitrate (IMDUR) 60 MG 24 hr tablet TAKE 1 TABLET BY MOUTH DAILY  . lansoprazole (PREVACID) 30 MG capsule Take 1 capsule (30 mg total) by mouth daily before supper.  . loratadine (CLARITIN) 10 MG tablet Take 10 mg daily by mouth.   . losartan (COZAAR) 100 MG tablet TAKE ONE TABLET BY MOUTH EVERY DAY  .  nitroGLYCERIN (NITROSTAT) 0.4 MG SL tablet Place 1 tablet (0.4 mg total) under the tongue every 5 (five) minutes as needed for chest pain.  [name] sertraline (ZOLOFT) 50 MG tablet TAKE ONE AND ONE-HALF TABLETS DAILY  . Trospium Chloride 60 MG CP24 TAKE ONE CAPSULE EACH MORNING BEFORE BREAKFAST  . vitamin C (ASCORBIC ACID) 500 MG tablet Take 1 tablet (500 mg total) by mouth 2 (two) times daily.       Objective:   Physical Exam BP 140/70 (BP Location: Left Arm, Patient Position: Sitting, Cuff Size: Normal)   Pulse 74   Temp (!) 97.3 F (36.3 C) (Temporal)   Ht 5\' 1"  (1.549 m)   Wt 140 lb (63.5 kg)   SpO2 97%   BMI 26.45 kg/m   GENERAL: Awake and alert, very spry.  She presents in a transport chair. HEAD: Normocephalic, atraumatic.  EYES: Pupils equal, round, reactive to light.  No scleral icterus.  MOUTH: Nose/mouth/throat not examined due to masking requirements for COVID 19. NECK: Supple. No thyromegaly. Trachea midline. No JVD.  No adenopathy. PULMONARY: Lungs clear to auscultation bilaterally.  Moving air well. CARDIOVASCULAR: S1 and S2. Regular rate and rhythm.  Aortic stenosis murmur unchanged. GASTROINTESTINAL: Benign. MUSCULOSKELETAL: No joint deformity, no clubbing, no edema.  NEUROLOGIC: No focal deficits noted.  Fluent speech.  No vocal hoarseness noted. SKIN: Intact,warm,dry. PSYCH: Mood and behavior appropriate.       Assessment & Plan:     ICD-10-CM   1. Reactive airways dysfunction syndrome (Maureen Morales)  J68.3    Patient has been reminded to try to use Xopenex when she notices "wheezing"  2. Vocal cord dysfunction  J38.3    This may be related to presbyphonia or presbylaryngeus Notes "wheezing" only when tired or when recumbent  3. Laryngopharyngeal reflux (LPR)  K21.9    Continue Prevacid  4. Nonrheumatic aortic valve stenosis  I35.0    This issue adds complexity to her management Followed by Dr. Fletcher Morales   Discussion:  She was encouraged to use her Xopenex when she  notices "wheezing".  I suspect that if her "wheezing" does not respond to Xopenex it is likely related to presbylaryngeus given that it resolves with rest.  In any event, it appears not to be causing her much functional impairment.  We will continue to monitor her closely.  She is to follow-up in 3 months time she is to contact us prior to that time should any new difficulties arise.   Renold Don, MD [facility]   *This note was dictated using voice recognition software/Dragon.  Despite best efforts to proofread, errors can occur which can change the meaning.  Any change was purely unintentional.

## 2019-10-02 NOTE — Patient Instructions (Signed)
Use your inhaler when you note wheezing  We will see him follow-up in 3 months time, call sooner should any new difficulties arise.

## 2019-10-07 ENCOUNTER — Other Ambulatory Visit: Payer: Self-pay | Admitting: Internal Medicine

## 2019-10-21 ENCOUNTER — Other Ambulatory Visit: Payer: Self-pay | Admitting: Internal Medicine

## 2019-10-23 ENCOUNTER — Other Ambulatory Visit: Payer: Self-pay | Admitting: Internal Medicine

## 2019-11-04 ENCOUNTER — Ambulatory Visit (INDEPENDENT_AMBULATORY_CARE_PROVIDER_SITE_OTHER): Payer: Medicare Other

## 2019-11-04 ENCOUNTER — Other Ambulatory Visit: Payer: Self-pay

## 2019-11-04 DIAGNOSIS — I350 Nonrheumatic aortic (valve) stenosis: Secondary | ICD-10-CM

## 2019-11-07 ENCOUNTER — Telehealth: Payer: Self-pay

## 2019-11-07 NOTE — Telephone Encounter (Signed)
DPR on file. Patients daughter [name] mad aware of the patients echo results with verbalized understanding.

## 2019-11-07 NOTE — Telephone Encounter (Signed)
-----   Message from Wellington Hampshire, MD sent at 11/07/2019 11:44 AM EDT ----- Inform patient that echo showed normal LV systolic function.  Aortic stenosis is still mild to moderate and has not changed significantly from before which is very good news.  She does have some diastolic function and elevated pulmonary pressure which might be causing shortness of breath.  I can discuss this with her during her office visit next week.

## 2019-11-11 ENCOUNTER — Other Ambulatory Visit: Payer: Self-pay

## 2019-11-11 ENCOUNTER — Ambulatory Visit (INDEPENDENT_AMBULATORY_CARE_PROVIDER_SITE_OTHER): Payer: Medicare Other | Admitting: Cardiovascular Disease

## 2019-11-11 ENCOUNTER — Encounter: Payer: Self-pay | Admitting: Cardiovascular Disease

## 2019-11-11 VITALS — BP 150/70 | HR 78 | Ht 61.5 in | Wt 143.5 lb

## 2019-11-11 DIAGNOSIS — I350 Nonrheumatic aortic (valve) stenosis: Secondary | ICD-10-CM

## 2019-11-11 DIAGNOSIS — E7849 Other hyperlipidemia: Secondary | ICD-10-CM

## 2019-11-11 DIAGNOSIS — I10 Essential (primary) hypertension: Secondary | ICD-10-CM

## 2019-11-11 DIAGNOSIS — I2510 Atherosclerotic heart disease of native coronary artery without angina pectoris: Secondary | ICD-10-CM | POA: Diagnosis not present

## 2019-11-11 DIAGNOSIS — I5032 Chronic diastolic (congestive) heart failure: Secondary | ICD-10-CM

## 2019-11-11 NOTE — Progress Notes (Signed)
Cardiology Office Note   Date:  11/11/2019   ID:  Maureen Morales, DOB Mar 24, 1923, MRN [id number]  PCP:  [name], MD  Cardiologist:   [name], MD   Chief Complaint  Patient presents with   office visit    F/U after echo; Meds verbally reviewed with patient.      History of Present Illness: Maureen Morales is a 86 y.o. female who presents for a follow up regarding aortic stenosis and coronary artery disease. She has chronic medical conditions that include hypertension, hyperlipidemia and type 2 diabetes.  She was hospitalized in May of 2018 with non-ST elevation myocardial infarction in the setting of uncontrolled hypertension. Troponin peaked at 5.79. Echocardiogram showed hyperdynamic LV systolic function, mild aortic stenosis with mean gradient of 14 mmHg, mild mitral regurgitation, mild pulmonary hypertension and trivial posterior pericardial effusion. Cardiac catheterization showed mild to moderate nonobstructive coronary artery disease. Worst stenosis was 60% in the mid right coronary artery. Outpatient renal artery duplex showed no evidence of renal artery stenosis. She had right hip fracture in 2018 after a fall and required another surgery after another fall.  She has difficult to control blood pressure with intolerance to antihypertensive medications.  She had a recent echocardiogram which showed an EF of 70 to 75% with grade 2 diastolic dysfunction, moderate pulmonary hypertension.  She has been doing well with no recent chest pain or worsening dyspnea.  She uses her wheelchair most of the time given continue of right hip pain.  Past Medical History:  Diagnosis Date   Aortic stenosis    a. 08/2016 Echo: Hyperdynamic LV fxn, mild AS (mean grad 24mmHg), mild MR, mild PAH; b. 05/2017 Echo: Hyperdynamic LV fxn, mod AS (mean grad 61mmHg, valve area 1.84). PASP 158mmHg.   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 08/2017 Renal U/S: No RAS.   Myocardial infarction ([place]) 2018   Non-obstructive CAD (coronary artery disease)    a. 08/2016 NSTEMI/Cath: mild to mod nonobs dzs including 60% mRCA stenosis-->Med rx.   NSTEMI (non-ST elevated myocardial infarction) ([place]) 08/30/2016   Peripheral vascular disease ([place])    Positive H. pylori test    Suspected COVID-19 virus infection 01/10/2019   Patient reporting malaise and headache for the past 3 days,  Similar to presentation when she was positive for  COVID 19 INFECTION  July 2 .  Previous inection was caused by exposure to COVID positive caregiver.  Repeat scenario:  Same caregiver's mother tested positive on Monday and caregiver saw current patient on Tuesday     Viral gastroenteritis due to [place]-like agent Nov 2012    Past Surgical History:  Procedure Laterality Date   ABDOMINAL HYSTERECTOMY     BLADDER SURGERY     CARDIAC CATHETERIZATION     CHOLECYSTECTOMY     COLONOSCOPY     06/16/1987, 02/20/1995, 05/04/1999, 02/14/2005, 05/17/2007   ESOPHAGOGASTRODUODENOSCOPY     05/11/1987, 06/13/1995, 10/09/1995, 05/04/1999, 01/16/2002   FLEXIBLE SIGMOIDOSCOPY N/A 05/02/2017   Procedure: Otho Darner SIGMOIDOSCOPY;  Surgeon: Manya Silvas, MD;  Location: [facility] END[facility];  Service: Endoscopy;  Laterality: N/A;   HEMORRHOID SURGERY N/A 05/14/2017   Procedure: EXTERNAL THROMBOSED HEMORRHOIDECTOMY;  Surgeon: Robert Bellow, MD;  Location: [facility] ORS;  Service: General;  Laterality: N/A;   HIP ARTHROPLASTY Right 02/17/2017   Procedure: ARTHROPLASTY BIPOLAR HIP (HEMIARTHROPLASTY);  Surgeon: Claud Kelp, MD;  Location: [facility] ORS;  Service: Orthopedics;   Laterality: Right;   LEFT HEART CATH AND CORONARY ANGIOGRAPHY N/A 08/31/2016   Procedure: Left Heart Cath and Coronary Angiography;  Surgeon: Wellington Hampshire, MD;  Location: [place] [facility];  Service: Cardiovascular;  Laterality: N/A;   ORIF PERIPROSTHETIC FRACTURE Right 05/27/2017   ORIF PERIPROSTHETIC FRACTURE Right 05/27/2017   Procedure: OPEN REDUCTION INTERNAL FIXATION (ORIF) PERIPROSTHETIC FRACTURE;  Surgeon: Mcarthur Rossetti, MD;  Location: [facility];  Service: Orthopedics;  Laterality: Right;   TOTAL HIP ARTHROPLASTY Right    uterian prolapse       Current Outpatient Medications  Medication Sig Dispense Refill   albuterol (VENTOLIN HFA) 108 (90 Base) MCG/ACT inhaler Inhale 2 puffs into the lungs every 6 (six) hours as needed for wheezing or shortness of breath. 18 g 1   atorvastatin (LIPITOR) 40 MG tablet TAKE 1 TABLET BY MOUTH DAILY 90 tablet 1   carvedilol (COREG) 6.25 MG tablet TAKE ONE TABLET TWICE DAILY WITH A MEAL 60 tablet 2   FLORA-Q (FLORA-Q) CAPS capsule Take 1 capsule by mouth daily.     glipiZIDE (GLUCOTROL) 5 MG tablet TAKE 1 TABLET BY MOUTH TWICE DAILY BEFORE A MEAL 180 tablet 1   glucose blood (BAYER CONTOUR TEST) test strip Use as instructed 100 each 5   hydrALAZINE (APRESOLINE) 50 MG tablet 75 MG IN AM AND AFTERNOON AND 50 MG IN THE EVENING     hydrochlorothiazide (MICROZIDE) 12.5 MG capsule TAKE 1 CAPSULE BY MOUTH EVERY DAY 30 capsule 6   isosorbide mononitrate (IMDUR) 60 MG 24 hr tablet TAKE 1 TABLET BY MOUTH DAILY 90 tablet 0   lansoprazole (PREVACID) 15 MG capsule Take 15 mg by mouth daily as needed.     loratadine (CLARITIN) 10 MG tablet Take 10 mg daily by mouth.      losartan (COZAAR) 100 MG tablet TAKE ONE TABLET BY MOUTH EVERY DAY 90 tablet 1   nitroGLYCERIN (NITROSTAT) 0.4 MG SL tablet Place 1 tablet (0.4 mg total) under the tongue every 5 (five) minutes as needed for chest pain. 10 tablet 2   sertraline (ZOLOFT) 50 MG tablet TAKE  ONE AND ONE-HALF TABLETS DAILY 140 tablet 1   Trospium Chloride 60 MG CP24 TAKE 1 CAPSULE BY MOUTH EACH MORNING BEFORE BREAKFAST 90 capsule 1   vitamin C (ASCORBIC ACID) 500 MG tablet Take 1 tablet (500 mg total) by mouth 2 (two) times daily. 60 tablet 2   cephALEXin (KEFLEX) 500 MG capsule Take 1 capsule (500 mg total) by mouth 4 (four) times daily. 28 capsule 0   hydrALAZINE (APRESOLINE) 25 MG tablet TAKE THREE TABLETS BY MOUTH 3 TIMES DAILY 540 tablet 1   lansoprazole (PREVACID) 30 MG capsule Take 1 capsule (30 mg total) by mouth daily before supper. 30 capsule 2   No current facility-administered medications for this visit.    Allergies:   Asa [aspirin], Aspirin, Atropine, Belladonna alkaloids, Codeine, Darvon, Darvon [propoxyphene], Demerol [meperidine], Ferrous sulfate, Meperidine and related, Methocarbamol, Penicillins, Penicillins, Sulfa antibiotics, and Iron    Social History:  The patient  reports that she has never smoked. She has never used smokeless tobacco. She reports that she  does not drink alcohol and does not use drugs.   Family History:  The patient's family history includes Breast cancer (age of onset: 66) in her sister; Breast cancer (age of onset: 2) in her mother; Cancer in her mother; Colon cancer in her brother, brother, and father; Stroke in her sister.    ROS:  Please see the history of present illness.   Otherwise, review of systems are positive for none.   All other systems are reviewed and negative.    PHYSICAL EXAM: VS:  BP (!) 150/70 (BP Location: Left Arm, Patient Position: Sitting, Cuff Size: Normal)    Pulse 78    Ht 5' 1.5" (1.562 m)    Wt 143 lb 8 oz (65.1 kg)    SpO2 94%    BMI 26.68 kg/m  , BMI Body mass index is 26.68 kg/m. GEN: Well nourished, well developed, in no acute distress  HEENT: normal  Neck: no JVD, carotid bruits, or masses Cardiac: RRR; no rubs, or gallops, mild bilateral leg edema . 3/6 crescendo decrescendo systolic murmur in  the aortic area which is mid peaking Respiratory:  clear to auscultation bilaterally, normal work of breathing GI: soft, nontender, nondistended, + BS MS: no deformity or atrophy  Skin: warm and dry, no rash Neuro:  Strength and sensation are intact Psych: euthymic mood, full affect    EKG:  EKG is ordered today. The ekg ordered today demonstrates normal sinus rhythm with no significant ST or T wave changes.   Recent Labs: 01/17/2019: Pro B Natriuretic peptide (BNP) 156.0; TSH 4.78 07/16/2019: Hemoglobin 11.9; Platelets 184.0 09/15/2019: ALT 29; BUN 45; Creatinine, Ser 1.21; Potassium 4.1; Sodium 134    Lipid Panel    Component Value Date/Time   CHOL 145 01/17/2019 1346   TRIG 314.0 (H) 01/17/2019 1346   HDL 32.80 (L) 01/17/2019 1346   CHOLHDL 4 01/17/2019 1346   VLDL 62.8 (H) 01/17/2019 1346   LDLCALC 68 12/04/2016 0808   LDLDIRECT 48.0 07/16/2019 1012      Wt Readings from Last 3 Encounters:  11/11/19 143 lb 8 oz (65.1 kg)  10/02/19 140 lb (63.5 kg)  07/16/19 139 lb 3.2 oz (63.1 kg)       No flowsheet data found.    ASSESSMENT AND PLAN:  1.  Coronary artery disease involving native coronary arteries with stable angina: She is doing reasonably well overall with controlled symptoms.  Continue medical therapy.    2.  Moderate aortic stenosis.  I reviewed results of recent echocardiogram with her which showed stable moderate aortic stenosis.  Repeat study in 1 year.  3.  Essential hypertension: Blood pressure is reasonably controlled on current medications  4.  Hyperlipidemia: Continue treatment with atorvastatin.  5.  Chronic diastolic heart failure: Recent echo showed grade 2 diastolic dysfunction with moderate pulmonary hypertension.  However, she has no significant dyspnea and thus I elected not to change to a loop diuretic.  She is currently on hydrochlorothiazide.    Disposition:   FU with me in 6 months.  Signed,  [name], MD  11/11/2019 3:16  PM    [place]

## 2019-11-11 NOTE — Patient Instructions (Signed)

## 2019-11-15 ENCOUNTER — Other Ambulatory Visit: Payer: Self-pay | Admitting: Internal Medicine

## 2019-11-27 ENCOUNTER — Other Ambulatory Visit: Payer: Self-pay | Admitting: Cardiovascular Disease

## 2019-12-03 ENCOUNTER — Other Ambulatory Visit: Payer: Self-pay | Admitting: Internal Medicine

## 2019-12-03 ENCOUNTER — Telehealth: Payer: Self-pay | Admitting: Internal Medicine

## 2019-12-03 ENCOUNTER — Other Ambulatory Visit: Payer: Self-pay

## 2019-12-03 ENCOUNTER — Other Ambulatory Visit (INDEPENDENT_AMBULATORY_CARE_PROVIDER_SITE_OTHER): Payer: Medicare Other

## 2019-12-03 DIAGNOSIS — R300 Dysuria: Secondary | ICD-10-CM | POA: Diagnosis not present

## 2019-12-03 LAB — URINALYSIS, ROUTINE W REFLEX MICROSCOPIC
Bilirubin Urine: NEGATIVE
Ketones, ur: NEGATIVE
Nitrite: NEGATIVE
Specific Gravity, Urine: 1.02 (ref 1.000–1.030)
Urine Glucose: NEGATIVE
Urobilinogen, UA: 0.2 (ref 0.0–1.0)
pH: 6 (ref 5.0–8.0)

## 2019-12-03 NOTE — Telephone Encounter (Signed)
Patien's aide dropping off urine for testing.  Labs ordered

## 2019-12-04 ENCOUNTER — Encounter: Payer: Self-pay | Admitting: Internal Medicine

## 2019-12-04 ENCOUNTER — Other Ambulatory Visit: Payer: Self-pay | Admitting: Internal Medicine

## 2019-12-04 MED ORDER — CIPROFLOXACIN HCL 250 MG PO TABS
250.0000 mg | ORAL_TABLET | Freq: Two times a day (BID) | ORAL | 0 refills | Status: DC
Start: 2019-12-04 — End: 2020-01-06

## 2019-12-06 LAB — URINE CULTURE

## 2019-12-22 ENCOUNTER — Ambulatory Visit (INDEPENDENT_AMBULATORY_CARE_PROVIDER_SITE_OTHER): Payer: Medicare Other | Admitting: Orthopaedic Surgery

## 2019-12-22 ENCOUNTER — Ambulatory Visit (INDEPENDENT_AMBULATORY_CARE_PROVIDER_SITE_OTHER): Payer: Medicare Other

## 2019-12-22 ENCOUNTER — Other Ambulatory Visit: Payer: Self-pay | Admitting: Pulmonary Disease

## 2019-12-22 DIAGNOSIS — M25512 Pain in left shoulder: Secondary | ICD-10-CM | POA: Diagnosis not present

## 2019-12-22 DIAGNOSIS — I2510 Atherosclerotic heart disease of native coronary artery without angina pectoris: Secondary | ICD-10-CM

## 2019-12-22 DIAGNOSIS — M25551 Pain in right hip: Secondary | ICD-10-CM | POA: Diagnosis not present

## 2019-12-22 MED ORDER — TRAMADOL HCL 50 MG PO TABS: 50.0000 mg | ORAL_TABLET | Freq: Four times a day (QID) | ORAL | 0 refills | Status: DC | PRN

## 2019-12-22 MED ORDER — METHYLPREDNISOLONE 4 MG PO TABS
ORAL_TABLET | ORAL | 0 refills | Status: DC
Start: 2019-12-22 — End: 2020-06-03

## 2019-12-22 NOTE — Progress Notes (Signed)
Office Visit Note   Patient: Maureen Morales           Date of Birth: 11-09-1922           MRN: [id number] Visit Date: 12/22/2019              Requested by: Maureen Mc, MD [address] [place],   [id number] PCP: Maureen Mc, MD   Assessment & Plan: Visit Diagnoses:  1. Pain in right hip   2. Acute pain of left shoulder     Plan: Given her multiple comorbidities and frailty, I would not recommend a hip revision on her right side because the surgery would be quite extensive with removal of the hardware and then replacing her hip.  I have recommended Voltaren gel over the trapezius area of her left shoulder as well as the trochanteric area of her right hip.  I am also try some tramadol for pain and a 6-day steroid taper.  All question concerns were answered and addressed.  I will see her back in 4 weeks to see how she is doing overall.  We can always consider a steroid injection over the trochanteric area of her right hip.  Follow-Up Instructions: Return in about 4 weeks (around 01/19/2020).   Orders:  Orders Placed This Encounter  Procedures  . XR HIP UNILAT W OR W/O PELVIS 1V RIGHT  . XR Shoulder Left   Meds ordered this encounter  Medications  . methylPREDNISolone (MEDROL) 4 MG tablet    Sig: Medrol dose pack. Take as instructed    Dispense:  21 tablet    Refill:  0  . traMADol (ULTRAM) 50 MG tablet    Sig: Take 1-2 tablets (50-100 mg total) by mouth every 6 (six) hours as needed.    Dispense:  30 tablet    Refill:  0      Procedures: No procedures performed   Clinical Data: No additional findings.   Subjective: Chief Complaint  Patient presents with  . Right Hip - Pain  . Left Shoulder - Pain  The patient is someone of seen before.  She is a 86 year old female who underwent open reduction-internal fixation of a periprosthetic hip fracture on the right side.  She had a hemiarthroplasty that was done elsewhere and then sustained a  mechanical fall.  She still has significant right hip pain.  She also reports left shoulder pain but she points to more of her neck and trapezius area as the source of her shoulder pain.  She is a diabetic but is only borderline.  She is allergic to codeine and anti-inflammatories.  She mainly gets around in a wheelchair but has a significant bout of pain in general.  She has had no other acute change in her medical status.  HPI  Review of Systems She currently denies any headache, chest pain, shortness of breath, fever, chills, nausea, vomiting  Objective: Vital Signs: There were no vitals taken for this visit.  Physical Exam She is alert and oriented x3 and in no acute distress Ortho Exam Examination of her left shoulder shows that it is clinically located.  Most of her pain is in the neck and trapezius area.  Examination of her right hip shows prominent hardware from the plating on the lateral aspect of her femur.  There is also evidence of hip bursitis and tendinitis.  The right hip is located. Specialty Comments:  No specialty comments available.  Imaging: XR HIP UNILAT W  OR W/O PELVIS 1V RIGHT  Result Date: 12/22/2019 An AP pelvis and lateral the right hip shows a hemiarthroplasty as well as a long plate with screws and cables from a previous periprosthetic fracture.  The hip is still located.  XR Shoulder Left  Result Date: 12/22/2019 3 views of the left shoulder show no acute findings.  There is no fracture and the shoulder is well located.  There is no significant arthritis.    PMFS History: Patient Active Problem List   Diagnosis Date Noted  . Paronychia of toe of right foot 09/26/2019  . Elevated ALT measurement 07/17/2019  . Urinary frequency 01/19/2019  . Low back pain with left-sided sciatica 04/29/2018  . Severe tricuspid regurgitation 09/03/2017  . Mitral and aortic valve regurgitation 09/03/2017  . Diastolic dysfunction without heart failure 09/03/2017  . Labile  blood pressure   . Intermittent asthma without complication   . Stage 3 chronic kidney disease   . Chronic diastolic congestive heart failure ([place])   . History of right hip hemiarthroplasty   . Diabetes mellitus type 2 in nonobese (HCC)   . Post-operative pain   . Fall 05/26/2017  . Closed right hip fracture ([place]) 05/26/2017  . Normocytic anemia 05/26/2017  . CAD (coronary artery disease) 05/26/2017  . Depression   . HLD (hyperlipidemia)   . 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 Generalized anxiety disorder 09/07/2016  . Unsteady gait 09/07/2016  . Urinary tract infection 02/19/2015  . S/P laparoscopic cholecystectomy 12/15/2013  . Encounter for preventive health examination 11/13/2013  . Cervical spine disease 01/27/2013  . Anemia, iron deficiency 01/21/2013  . Right medial knee pain 01/09/2013  . Back pain, thoracic 08/27/2012  . Moderate aortic stenosis by prior echocardiogram 05/06/2012  . Chronic interstitial lung disease ([place]) 07/04/2011  . Hemorrhoids, internal 04/05/2011  . Nummular eczema 01/16/2011  . Essential hypertension   . Type II diabetes mellitus with nephropathy ([place])   . Hyperlipidemia    Past Medical History:  Diagnosis Date  . Aortic stenosis    a. 08/2016 Echo: Hyperdynamic LV fxn, mild AS (mean grad 25mmHg), mild MR, mild PAH; b. 05/2017 Echo: Hyperdynamic LV fxn, mod AS (mean grad 10mmHg, valve area 1.84). PASP 160mmHg.  . Asthma without status asthmaticus    unspecified  . Colitis    unspecified  . Complication of anesthesia    sensitive to anesthesia  . Depression   . Diabetes mellitus type 2, uncomplicated ([place])   . Essential hypertension   . GERD (gastroesophageal reflux disease)   . 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 Hyperlipidemia    unspecified  . Hypertension    a. 08/2017 Renal U/S: No RAS.  [name] Myocardial infarction ([place]) 2018  . Non-obstructive CAD (coronary artery disease)     a. 08/2016 NSTEMI/Cath: mild to mod nonobs dzs including 60% mRCA stenosis-->Med rx.  . NSTEMI (non-ST elevated myocardial infarction) ([place]) 08/30/2016  . Peripheral vascular disease ([place])   . Positive H. pylori test   . Suspected COVID-19 virus infection 01/10/2019   Patient reporting malaise and headache for the past 3 days,  Similar to presentation when she was positive for  COVID 19 INFECTION  July 2 .  Previous inection was caused by exposure to COVID positive caregiver.  Repeat scenario:  Same caregiver's mother tested positive on Monday and caregiver saw current patient on Tuesday    . Viral gastroenteritis due to [place]-like agent  Nov 2012    Family History  Problem Relation Age of Onset  . Breast cancer Mother 6  . Colon cancer Father   . Stroke Sister   . Cancer Mother   . Breast cancer Sister 24  . Colon cancer Brother   . Colon cancer Brother     Past Surgical History:  Procedure Laterality Date  . ABDOMINAL HYSTERECTOMY    . BLADDER SURGERY    . CARDIAC CATHETERIZATION    . CHOLECYSTECTOMY    . COLONOSCOPY     06/16/1987, 02/20/1995, 05/04/1999, 02/14/2005, 05/17/2007  . ESOPHAGOGASTRODUODENOSCOPY     05/11/1987, 06/13/1995, 10/09/1995, 05/04/1999, 01/16/2002  . FLEXIBLE SIGMOIDOSCOPY N/A 05/02/2017   Procedure: FLEXIBLE SIGMOIDOSCOPY;  Surgeon: Manya Silvas, MD;  Location[facility] ENDOSCOPY;  Service: Endoscopy;  Laterality: N/A;  . HEMORRHOID SURGERY N/A 05/14/2017   Procedure: EXTERNAL THROMBOSED HEMORRHOIDECTOMY;  Surgeon: Robert Bellow, MD;  Location: [facility] ORS;  Service: General;  Laterality: N/A;  . HIP ARTHROPLASTY Right 02/17/2017   Procedure: ARTHROPLASTY BIPOLAR HIP (HEMIARTHROPLASTY);  Surgeon: Claud Kelp, MD;  Location: [facility] ORS;  Service: Orthopedics;  Laterality: Right;  . LEFT HEART CATH AND CORONARY ANGIOGRAPHY N/A 08/31/2016   Procedure: Left Heart Cath and Coronary Angiography;  Surgeon: Wellington Hampshire, MD;  Location: [facility] LAB;   Service: Cardiovascular;  Laterality: N/A;  . ORIF PERIPROSTHETIC FRACTURE Right 05/27/2017  . ORIF PERIPROSTHETIC FRACTURE Right 05/27/2017   Procedure: OPEN REDUCTION INTERNAL FIXATION (ORIF) PERIPROSTHETIC FRACTURE;  Surgeon: Mcarthur Rossetti, MD;  Location: [facility];  Service: Orthopedics;  Laterality: Right;  . TOTAL HIP ARTHROPLASTY Right   . Theadora Rama prolapse     Social History   Occupational History  . Not on file  Tobacco Use  . Smoking status: Never Smoker  . Smokeless tobacco: Never Used  Vaping Use  . Vaping Use: Never used  Substance and Sexual Activity  . Alcohol use: No  . Drug use: No  . Sexual activity: Never

## 2019-12-29 ENCOUNTER — Other Ambulatory Visit: Payer: Self-pay | Admitting: Cardiovascular Disease

## 2020-01-05 ENCOUNTER — Other Ambulatory Visit: Payer: Self-pay | Admitting: Internal Medicine

## 2020-01-06 ENCOUNTER — Other Ambulatory Visit: Payer: Self-pay | Admitting: Internal Medicine

## 2020-01-06 ENCOUNTER — Encounter: Payer: Self-pay | Admitting: Internal Medicine

## 2020-01-06 ENCOUNTER — Other Ambulatory Visit (INDEPENDENT_AMBULATORY_CARE_PROVIDER_SITE_OTHER): Payer: Medicare Other

## 2020-01-06 DIAGNOSIS — G8911 Acute pain due to trauma: Secondary | ICD-10-CM

## 2020-01-06 DIAGNOSIS — R109 Unspecified abdominal pain: Secondary | ICD-10-CM | POA: Diagnosis not present

## 2020-01-06 DIAGNOSIS — M25512 Pain in left shoulder: Secondary | ICD-10-CM | POA: Insufficient documentation

## 2020-01-06 DIAGNOSIS — R300 Dysuria: Secondary | ICD-10-CM

## 2020-01-06 LAB — URINALYSIS, ROUTINE W REFLEX MICROSCOPIC
Bilirubin Urine: NEGATIVE
Hgb urine dipstick: NEGATIVE
Ketones, ur: NEGATIVE
Nitrite: POSITIVE — AB
RBC / HPF: NONE SEEN (ref 0–?)
Specific Gravity, Urine: 1.015 (ref 1.000–1.030)
Total Protein, Urine: 30 — AB
Urine Glucose: NEGATIVE
Urobilinogen, UA: 0.2 (ref 0.0–1.0)
pH: 5.5 (ref 5.0–8.0)

## 2020-01-06 MED ORDER — PREDNISONE 10 MG PO TABS: ORAL_TABLET | ORAL | 0 refills | Status: DC

## 2020-01-06 NOTE — Assessment & Plan Note (Addendum)
Shoulder has been painful and limited in ROM for 3-4 weeks following a fall out of her wheelchair.  Plain films were done by her orthopedic surgeon Maureen Morales, and no fractures were seen.  The pain is controlled with low doses of advill combined with 1000 mg tylenol bid,  but improved transiently with a prednisone taper.  She is intolerant of tramadol due to excessive sedation .  She is continually worried despite reassurance that there  Is no fracture. Suspect rotator cuff injury.  Her daughter Maureen Morales has requested an MRI to help define the injury for patient.

## 2020-01-07 ENCOUNTER — Other Ambulatory Visit: Payer: Self-pay | Admitting: Internal Medicine

## 2020-01-07 ENCOUNTER — Other Ambulatory Visit: Payer: Medicare Other

## 2020-01-07 DIAGNOSIS — R109 Unspecified abdominal pain: Secondary | ICD-10-CM

## 2020-01-07 DIAGNOSIS — R300 Dysuria: Secondary | ICD-10-CM

## 2020-01-07 NOTE — Addendum Note (Signed)
Addended by: Leeanne Rio on: 01/07/2020 12:37 PM   Modules accepted: Orders

## 2020-01-08 ENCOUNTER — Other Ambulatory Visit: Payer: Self-pay | Admitting: Internal Medicine

## 2020-01-08 LAB — URINE CULTURE

## 2020-01-08 LAB — EXTRA URINE SPECIMEN

## 2020-01-08 MED ORDER — NITROFURANTOIN MONOHYD MACRO 100 MG PO CAPS: 100.0000 mg | ORAL_CAPSULE | Freq: Two times a day (BID) | ORAL | 0 refills | Status: DC

## 2020-01-08 NOTE — Addendum Note (Signed)
Addended by: Leeanne Rio on: 01/08/2020 03:55 PM   Modules accepted: Orders

## 2020-01-10 LAB — URINE CULTURE
MICRO NUMBER:: 10929816
SPECIMEN QUALITY:: ADEQUATE

## 2020-01-17 ENCOUNTER — Other Ambulatory Visit: Payer: Self-pay | Admitting: Internal Medicine

## 2020-01-17 ENCOUNTER — Encounter: Payer: Self-pay | Admitting: Internal Medicine

## 2020-01-17 DIAGNOSIS — M47812 Spondylosis without myelopathy or radiculopathy, cervical region: Secondary | ICD-10-CM

## 2020-01-17 DIAGNOSIS — R7401 Elevation of levels of liver transaminase levels: Secondary | ICD-10-CM

## 2020-01-17 DIAGNOSIS — N3944 Nocturnal enuresis: Secondary | ICD-10-CM | POA: Insufficient documentation

## 2020-01-17 NOTE — Assessment & Plan Note (Signed)
New onset,  With prior occurrence heralding UTI

## 2020-01-19 ENCOUNTER — Ambulatory Visit: Payer: Medicare Other | Admitting: Orthopaedic Surgery

## 2020-01-26 ENCOUNTER — Ambulatory Visit (INDEPENDENT_AMBULATORY_CARE_PROVIDER_SITE_OTHER): Payer: Medicare Other | Admitting: Orthopaedic Surgery

## 2020-01-26 ENCOUNTER — Encounter: Payer: Self-pay | Admitting: Orthopaedic Surgery

## 2020-01-26 DIAGNOSIS — M25551 Pain in right hip: Secondary | ICD-10-CM

## 2020-01-26 DIAGNOSIS — M25512 Pain in left shoulder: Secondary | ICD-10-CM

## 2020-01-26 DIAGNOSIS — I2510 Atherosclerotic heart disease of native coronary artery without angina pectoris: Secondary | ICD-10-CM | POA: Diagnosis not present

## 2020-01-26 MED ORDER — METHYLPREDNISOLONE ACETATE 40 MG/ML IJ SUSP
40.0000 mg | INTRAMUSCULAR | Status: AC | PRN
  Administered 2020-01-26: 40 mg via INTRA_ARTICULAR

## 2020-01-26 MED ORDER — LIDOCAINE HCL 1 % IJ SOLN
3.0000 mL | INTRAMUSCULAR | Status: AC | PRN
  Administered 2020-01-26: 3 mL

## 2020-01-26 NOTE — Progress Notes (Signed)
Office Visit Note   Patient: Maureen Morales           Date of Birth: 1922-10-30           MRN: [id number] Visit Date: 01/26/2020              Requested by: [name], MD [address] [place],  [place] [address] PCP: [name], MD   Assessment & Plan: Visit Diagnoses:  1. Acute pain of left shoulder   2. Pain in right hip     Plan: I did recommend a steroid injection in her left shoulder subacromial space which she tolerated well.  All question concerns were answered and addressed.  Follow-up can be as needed.  Follow-Up Instructions: No follow-ups on file.   Orders:  Orders Placed This Encounter  Procedures  . Large Joint Inj   No orders of the defined types were placed in this encounter.     Procedures: Large Joint Inj: R subacromial bursa on 01/26/2020 10:54 AM Indications: pain and diagnostic evaluation Details: 22 G 1.5 in needle  Arthrogram: No  Medications: 3 mL lidocaine 1 %; 40 mg methylPREDNISolone acetate 40 MG/ML Outcome: tolerated well, no immediate complications Procedure, treatment alternatives, risks and benefits explained, specific risks discussed. Consent was given by the patient. Immediately prior to procedure a time out was called to verify the correct patient, procedure, equipment, support staff and site/side marked as required. Patient was prepped and draped in the usual sterile fashion.       Clinical Data: No additional findings.   Subjective: Chief Complaint  Patient presents with  . Right Hip - Follow-up  The patient is an 86 year old well-known to me.  She comes in today with chief complaint of left shoulder pain.  We have tried a steroid taper and that has helped some.  She has very painful when she does do certain activities with that shoulder.  She tries to be active.  She points the posterior aspect of her shoulder as a source of her pain.  HPI  Review of Systems Today she denies any headache, chest pain,  shortness of breath, fever, chills, nausea, vomiting  Objective: Vital Signs: There were no vitals taken for this visit.  Physical Exam She is alert and oriented x3 and in no acute distress Ortho Exam Examination of her left shoulder shows fluid range of motion the shoulder but it is painful.  There is no blocks rotation and it is clinically located. Specialty Comments:  No specialty comments available.  Imaging: No results found.   PMFS History: Patient Active Problem List   Diagnosis Date Noted  . Urinary incontinence, nocturnal enuresis 01/17/2020  . Acute shoulder pain due to trauma, left 01/06/2020  . Elevated ALT measurement 07/17/2019  . Moderate tricuspid valve regurgitation 09/03/2017  . Diastolic dysfunction without heart failure 09/03/2017  . Labile blood pressure   . Stage 3 chronic kidney disease   . Chronic diastolic congestive heart failure ([place])   . History of right hip hemiarthroplasty   . Diabetes mellitus type 2 in nonobese (HCC)   . Recurrent falls 05/26/2017  . Normocytic anemia 05/26/2017  . CAD (coronary artery disease) 05/26/2017  . Generalized anxiety disorder 09/07/2016  . Unsteady gait 09/07/2016  . S/P laparoscopic cholecystectomy 12/15/2013  . Encounter for preventive health examination 11/13/2013  . Spondylosis of cervical spine 01/27/2013  . Anemia, iron deficiency 01/21/2013  . Moderate aortic stenosis by prior echocardiogram 05/06/2012  .  Chronic interstitial lung disease ([place]) 07/04/2011  . Hemorrhoids, internal 04/05/2011  . Essential hypertension   . Type II diabetes mellitus with nephropathy ([place])   . Hyperlipidemia    Past Medical History:  Diagnosis Date  . Aortic stenosis    a. 08/2016 Echo: Hyperdynamic LV fxn, mild AS (mean grad 51mmHg), mild MR, mild PAH; b. 05/2017 Echo: Hyperdynamic LV fxn, mod AS (mean grad 78mmHg, valve area 1.84). PASP 164mmHg.  . Asthma without status asthmaticus    unspecified  . Closed right hip  fracture ([place]) 05/26/2017  . Colitis    unspecified  . Complication of anesthesia    sensitive to anesthesia  . Depression   . Diabetes mellitus type 2, uncomplicated ([place])   . Essential hypertension   . GERD (gastroesophageal reflux disease)   . 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 Hyperlipidemia    unspecified  . Hypertension    a. 08/2017 Renal U/S: No RAS.  [name] Low back pain with left-sided sciatica 04/29/2018  . Myocardial infarction ([place]) 2018  . Non-obstructive CAD (coronary artery disease)    a. 08/2016 NSTEMI/Cath: mild to mod nonobs dzs including 60% mRCA stenosis-->Med rx.  . NSTEMI (non-ST elevated myocardial infarction) ([place]) 08/30/2016  . Nummular eczema 01/16/2011   Occurring on chest and under breast.  Treated by Dr Evorn Gong with fluocinonide 0.05% topical 06/26/12   . Peripheral vascular disease ([place])   . 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 Positive H. pylori test   . Suspected COVID-19 virus infection 01/10/2019   Patient reporting malaise and headache for the past 3 days,  Similar to presentation when she was positive for  COVID 19 INFECTION  July 2 .  Previous inection was caused by exposure to COVID positive caregiver.  Repeat scenario:  Same caregiver's mother tested positive on Monday and caregiver saw current patient on Tuesday    . Viral gastroenteritis due to [place]walk-like agent Nov 2012    Family History  Problem Relation Age of Onset  . Breast cancer Mother 4  . Colon cancer Father   . Stroke Sister   . Cancer Mother   . Breast cancer Sister 56  . Colon cancer Brother   . Colon cancer Brother     Past Surgical History:  Procedure Laterality Date  . ABDOMINAL HYSTERECTOMY    . BLADDER SURGERY    . CARDIAC CATHETERIZATION    . CHOLECYSTECTOMY    . COLONOSCOPY     06/16/1987, 02/20/1995, 05/04/1999, 02/14/2005, 05/17/2007  . ESOPHAGOGASTRODUODENOSCOPY     05/11/1987, 06/13/1995, 10/09/1995,  05/04/1999, 01/16/2002  . FLEXIBLE SIGMOIDOSCOPY N/A 05/02/2017   Procedure: FLEXIBLE SIGMOIDOSCOPY;  Surgeon: Manya Silvas, MD;  Location: [facility] ENDOSCOPY;  Service: Endoscopy;  Laterality: N/A;  . HEMORRHOID SURGERY N/A 05/14/2017   Procedure: EXTERNAL THROMBOSED HEMORRHOIDECTOMY;  Surgeon: Robert Bellow, MD;  Location: [facility] ORS;  Service: General;  Laterality: N/A;  . HIP ARTHROPLASTY Right 02/17/2017   Procedure: ARTHROPLASTY BIPOLAR HIP (HEMIARTHROPLASTY);  Surgeon: Claud Kelp, MD;  Location: [facility] ORS;  Service: Orthopedics;  Laterality: Right;  . LEFT HEART CATH AND CORONARY ANGIOGRAPHY N/A 08/31/2016   Procedure: Left Heart Cath and Coronary Angiography;  Surgeon: Wellington Hampshire, MD;  Location: [place] CV LAB;  Service: Cardiovascular;  Laterality: N/A;  . ORIF PERIPROSTHETIC FRACTURE Right 05/27/2017  . ORIF PERIPROSTHETIC FRACTURE Right 05/27/2017   Procedure: OPEN REDUCTION INTERNAL FIXATION (ORIF) PERIPROSTHETIC FRACTURE;  Surgeon: Ninfa Linden,  Lind Guest, MD;  Location: [place];  Service: Orthopedics;  Laterality: Right;  . TOTAL HIP ARTHROPLASTY Right   . Theadora Rama prolapse     Social History   Occupational History  . Not on file  Tobacco Use  . Smoking status: Never Smoker  . Smokeless tobacco: Never Used  Vaping Use  . Vaping Use: Never used  Substance and Sexual Activity  . Alcohol use: No  . Drug use: No  . Sexual activity: Never

## 2020-01-27 ENCOUNTER — Other Ambulatory Visit: Payer: Self-pay | Admitting: Internal Medicine

## 2020-02-10 ENCOUNTER — Other Ambulatory Visit: Payer: Self-pay | Admitting: Internal Medicine

## 2020-02-11 ENCOUNTER — Other Ambulatory Visit: Payer: Self-pay | Admitting: Internal Medicine

## 2020-02-11 DIAGNOSIS — G8929 Other chronic pain: Secondary | ICD-10-CM

## 2020-02-11 DIAGNOSIS — M5412 Radiculopathy, cervical region: Secondary | ICD-10-CM

## 2020-02-11 DIAGNOSIS — M25512 Pain in left shoulder: Secondary | ICD-10-CM

## 2020-02-23 ENCOUNTER — Ambulatory Visit: Payer: Medicare Other | Attending: Internal Medicine

## 2020-02-23 DIAGNOSIS — Z23 Encounter for immunization: Secondary | ICD-10-CM

## 2020-02-23 NOTE — Progress Notes (Signed)
   Covid-19 Vaccination Clinic  Name:  VARONICA SIHARATH    MRN: [id number] DOB: 11/30/1922  02/23/2020  Ms. Gondek was observed post Covid-19 immunization for 15 minutes without incident. She was provided with Vaccine Information Sheet and instruction to access the V-Safe system.   Ms. Koranda was instructed to call 911 with any severe reactions post vaccine: [name] Difficulty breathing  . Swelling of face and throat  . A fast heartbeat  . A bad rash all over body  . Dizziness and weakness

## 2020-02-25 ENCOUNTER — Other Ambulatory Visit: Payer: Self-pay

## 2020-02-25 ENCOUNTER — Other Ambulatory Visit (INDEPENDENT_AMBULATORY_CARE_PROVIDER_SITE_OTHER): Payer: Medicare Other

## 2020-02-25 DIAGNOSIS — N3944 Nocturnal enuresis: Secondary | ICD-10-CM | POA: Diagnosis not present

## 2020-02-25 LAB — URINALYSIS, ROUTINE W REFLEX MICROSCOPIC
Bilirubin Urine: NEGATIVE
Hgb urine dipstick: NEGATIVE
Ketones, ur: NEGATIVE
Nitrite: POSITIVE — AB
RBC / HPF: NONE SEEN (ref 0–?)
Specific Gravity, Urine: 1.02 (ref 1.000–1.030)
Total Protein, Urine: 30 — AB
Urine Glucose: NEGATIVE
Urobilinogen, UA: 0.2 (ref 0.0–1.0)
pH: 5.5 (ref 5.0–8.0)

## 2020-02-26 ENCOUNTER — Other Ambulatory Visit: Payer: Self-pay | Admitting: Internal Medicine

## 2020-02-26 DIAGNOSIS — N3000 Acute cystitis without hematuria: Secondary | ICD-10-CM | POA: Insufficient documentation

## 2020-02-26 LAB — URINE CULTURE
MICRO NUMBER:: 11125625
SPECIMEN QUALITY:: ADEQUATE

## 2020-02-26 MED ORDER — NITROFURANTOIN MONOHYD MACRO 100 MG PO CAPS
100.0000 mg | ORAL_CAPSULE | Freq: Two times a day (BID) | ORAL | 0 refills | Status: DC
Start: 2020-02-26 — End: 2020-06-03

## 2020-02-26 NOTE — Assessment & Plan Note (Signed)
Treating empirically with macrobid pending culture speciation. Based on previous E coli sensitivities

## 2020-02-27 ENCOUNTER — Other Ambulatory Visit: Payer: Self-pay | Admitting: Internal Medicine

## 2020-02-27 MED ORDER — CEPHALEXIN 500 MG PO CAPS: 500.0000 mg | ORAL_CAPSULE | Freq: Four times a day (QID) | ORAL | 0 refills | Status: DC

## 2020-03-01 ENCOUNTER — Ambulatory Visit
Admission: RE | Admit: 2020-03-01 | Discharge: 2020-03-01 | Disposition: A | Discharge status: Home / Self Care | Payer: Medicare Other | Source: Ambulatory Visit | Attending: Internal Medicine | Admitting: Internal Medicine

## 2020-03-01 ENCOUNTER — Other Ambulatory Visit: Payer: Self-pay

## 2020-03-01 DIAGNOSIS — M25512 Pain in left shoulder: Secondary | ICD-10-CM | POA: Insufficient documentation

## 2020-03-01 DIAGNOSIS — M5412 Radiculopathy, cervical region: Secondary | ICD-10-CM | POA: Diagnosis present

## 2020-03-01 DIAGNOSIS — G8929 Other chronic pain: Secondary | ICD-10-CM | POA: Diagnosis present

## 2020-03-02 ENCOUNTER — Other Ambulatory Visit: Payer: Self-pay | Admitting: Internal Medicine

## 2020-03-02 DIAGNOSIS — L401 Generalized pustular psoriasis: Secondary | ICD-10-CM | POA: Insufficient documentation

## 2020-03-02 MED ORDER — FAMCICLOVIR 500 MG PO TABS: 500.0000 mg | ORAL_TABLET | Freq: Three times a day (TID) | ORAL | 0 refills | Status: DC

## 2020-03-02 NOTE — Assessment & Plan Note (Signed)
Involving the superior forehead starting at midline and involving left side of forehead.  Patient states that the infection started on or around oct 23rd and she used topical antibiotics for one week before her daughter made me aware of the condition. . Cephalexin was started on Friday October 30th with initial improvement of erythema and left scleral irritation,  But satellite lesions have developed in the last 48 hours, , mostly to the left of midline .  Adding  Famciclovir and recommending dermatology evaluation

## 2020-03-02 NOTE — Progress Notes (Signed)
Results of MRI os shoulder and c spine reported to [facility] by Dr Derrel Nip . Sports medicine and PT referrals offered

## 2020-03-04 ENCOUNTER — Encounter: Payer: Self-pay | Admitting: Internal Medicine

## 2020-03-04 DIAGNOSIS — B029 Zoster without complications: Secondary | ICD-10-CM | POA: Insufficient documentation

## 2020-03-04 HISTORY — DX: Zoster without complications: B02.9

## 2020-03-16 ENCOUNTER — Other Ambulatory Visit: Payer: Self-pay | Admitting: Internal Medicine

## 2020-03-30 ENCOUNTER — Other Ambulatory Visit: Payer: Self-pay | Admitting: Internal Medicine

## 2020-03-30 ENCOUNTER — Other Ambulatory Visit: Payer: Self-pay | Admitting: Cardiovascular Disease

## 2020-05-03 ENCOUNTER — Other Ambulatory Visit (INDEPENDENT_AMBULATORY_CARE_PROVIDER_SITE_OTHER): Payer: Medicare Other

## 2020-05-03 ENCOUNTER — Other Ambulatory Visit: Payer: Self-pay

## 2020-05-03 ENCOUNTER — Telehealth: Payer: Self-pay

## 2020-05-03 DIAGNOSIS — R300 Dysuria: Secondary | ICD-10-CM

## 2020-05-03 LAB — POCT URINALYSIS DIPSTICK
Bilirubin, UA: NEGATIVE
Blood, UA: NEGATIVE
Glucose, UA: NEGATIVE
Ketones, UA: NEGATIVE
Nitrite, UA: POSITIVE
Protein, UA: NEGATIVE
Spec Grav, UA: 1.01 (ref 1.010–1.025)
Urobilinogen, UA: 0.2 E.U./dL
pH, UA: 5.5 (ref 5.0–8.0)

## 2020-05-03 NOTE — Telephone Encounter (Signed)
Placed orders for a urine drop off

## 2020-05-04 LAB — URINALYSIS, MICROSCOPIC ONLY: RBC / HPF: NONE SEEN (ref 0–?)

## 2020-05-05 NOTE — Telephone Encounter (Signed)
Pt called returning a call about lab results  

## 2020-05-05 NOTE — Telephone Encounter (Signed)
Please see Lab result note

## 2020-05-06 ENCOUNTER — Other Ambulatory Visit: Payer: Self-pay | Admitting: Internal Medicine

## 2020-05-06 DIAGNOSIS — B9620 Unspecified Escherichia coli [E. coli] as the cause of diseases classified elsewhere: Secondary | ICD-10-CM | POA: Insufficient documentation

## 2020-05-06 LAB — URINE CULTURE
MICRO NUMBER:: 11379945
SPECIMEN QUALITY:: ADEQUATE

## 2020-05-06 MED ORDER — CIPROFLOXACIN HCL 250 MG PO TABS: 250.0000 mg | ORAL_TABLET | Freq: Two times a day (BID) | ORAL | 0 refills | Status: DC

## 2020-05-06 NOTE — Assessment & Plan Note (Signed)
Starting cipro empirically due to patient's wait time; has been symptomatic for over 6  Days .

## 2020-05-14 ENCOUNTER — Other Ambulatory Visit: Payer: Self-pay | Admitting: Internal Medicine

## 2020-05-18 ENCOUNTER — Ambulatory Visit: Payer: Medicare Other | Admitting: Cardiovascular Disease

## 2020-05-28 ENCOUNTER — Other Ambulatory Visit: Payer: Self-pay | Admitting: Cardiovascular Disease

## 2020-05-28 ENCOUNTER — Other Ambulatory Visit: Payer: Self-pay | Admitting: Internal Medicine

## 2020-05-28 NOTE — Telephone Encounter (Signed)
Rx request sent to pharmacy.  

## 2020-05-31 DIAGNOSIS — H903 Sensorineural hearing loss, bilateral: Secondary | ICD-10-CM | POA: Diagnosis not present

## 2020-05-31 DIAGNOSIS — H6123 Impacted cerumen, bilateral: Secondary | ICD-10-CM | POA: Diagnosis not present

## 2020-06-03 ENCOUNTER — Encounter: Payer: Self-pay | Admitting: Cardiovascular Disease

## 2020-06-03 ENCOUNTER — Other Ambulatory Visit: Payer: Self-pay

## 2020-06-03 ENCOUNTER — Ambulatory Visit (INDEPENDENT_AMBULATORY_CARE_PROVIDER_SITE_OTHER): Payer: Medicare Other | Admitting: Cardiovascular Disease

## 2020-06-03 VITALS — BP 148/74 | HR 70 | Ht 61.5 in | Wt 138.0 lb

## 2020-06-03 DIAGNOSIS — I10 Essential (primary) hypertension: Secondary | ICD-10-CM

## 2020-06-03 DIAGNOSIS — E785 Hyperlipidemia, unspecified: Secondary | ICD-10-CM

## 2020-06-03 DIAGNOSIS — I350 Nonrheumatic aortic (valve) stenosis: Secondary | ICD-10-CM | POA: Diagnosis not present

## 2020-06-03 DIAGNOSIS — I5032 Chronic diastolic (congestive) heart failure: Secondary | ICD-10-CM | POA: Diagnosis not present

## 2020-06-03 DIAGNOSIS — I25118 Atherosclerotic heart disease of native coronary artery with other forms of angina pectoris: Secondary | ICD-10-CM

## 2020-06-03 NOTE — Progress Notes (Signed)
Cardiology Office Note   Date:  06/03/2020   ID:  Maureen Morales, Maureen Morales 1922-06-18, MRN [id number]  PCP:  [name], MD  Cardiologist:   [name], MD   Chief Complaint  Patient presents with  . Follow-up    6 months  Pt states no new or worsening Sx. States BP and everything has been good.      History of Present Illness: Maureen Morales is a 87 y.o. female who presents for a follow up regarding aortic stenosis and coronary artery disease. She has chronic medical conditions that include hypertension, hyperlipidemia and type 2 diabetes.  She was hospitalized in May of 2018 with non-ST elevation myocardial infarction in the setting of uncontrolled hypertension. Troponin peaked at 5.79. Echocardiogram showed hyperdynamic LV systolic function, mild aortic stenosis with mean gradient of 14 mmHg, mild mitral regurgitation, mild pulmonary hypertension and trivial posterior pericardial effusion. Cardiac catheterization showed mild to moderate nonobstructive coronary artery disease. Worst stenosis was 60% in the mid right coronary artery. Outpatient renal artery duplex showed no evidence of renal artery stenosis. She had right hip fracture in 2018 after a fall and required another surgery after another fall.  She has difficult to control blood pressure with intolerance to antihypertensive medications.  Most recent echocardiogram in July 2021 showed an EF of 70 to 75% with grade 2 diastolic dysfunction, mild to moderate aortic stenosis and moderate pulmonary hypertension.  She has been doing well with no recent chest pain, shortness of breath or palpitations.  Blood pressure has been stable.  He walks with a walker and frequently uses a wheelchair when she is outside as she is afraid to fall.  Past Medical History:  Diagnosis Date  . Aortic stenosis    a. 08/2016 Echo: Hyperdynamic LV fxn, mild AS (mean grad 68mmHg), mild MR, mild PAH; b. 05/2017 Echo: Hyperdynamic LV fxn, mod AS  (mean grad 61mmHg, valve area 1.84). PASP 128mmHg.  . Asthma without status asthmaticus    unspecified  . Closed right hip fracture ([place]) 05/26/2017  . Colitis    unspecified  . Complication of anesthesia    sensitive to anesthesia  . Depression   . Diabetes mellitus type 2, uncomplicated ([place])   . Essential hypertension   . GERD (gastroesophageal reflux disease)   . 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 Hyperlipidemia    unspecified  . Hypertension    a. 08/2017 Renal U/S: No RAS.  [name] Low back pain with left-sided sciatica 04/29/2018  . Myocardial infarction ([place]) 2018  . Non-obstructive CAD (coronary artery disease)    a. 08/2016 NSTEMI/Cath: mild to mod nonobs dzs including 60% mRCA stenosis-->Med rx.  . NSTEMI (non-ST elevated myocardial infarction) ([place]) 08/30/2016  . Nummular eczema 01/16/2011   Occurring on chest and under breast.  Treated by Dr Evorn Gong with fluocinonide 0.05% topical 06/26/12   . Peripheral vascular disease ([place])   . 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 Positive H. pylori test   . Suspected COVID-19 virus infection 01/10/2019   Patient reporting malaise and headache for the past 3 days,  Similar to presentation when she was positive for  COVID 19 INFECTION  July 2 .  Previous inection was caused by exposure to COVID positive caregiver.  Repeat scenario:  Same caregiver's mother tested positive on Monday and caregiver saw current patient on Tuesday    . Viral gastroenteritis due to [place]-like agent  Nov 2012    Past Surgical History:  Procedure Laterality Date  . ABDOMINAL HYSTERECTOMY    . BLADDER SURGERY    . CARDIAC CATHETERIZATION    . CHOLECYSTECTOMY    . COLONOSCOPY     06/16/1987, 02/20/1995, 05/04/1999, 02/14/2005, 05/17/2007  . ESOPHAGOGASTRODUODENOSCOPY     05/11/1987, 06/13/1995, 10/09/1995, 05/04/1999, 01/16/2002  . FLEXIBLE SIGMOIDOSCOPY N/A 05/02/2017   Procedure: FLEXIBLE SIGMOIDOSCOPY;   Surgeon: Manya Silvas, MD;  Location: [facility] ENDOSCOPY;  Service: Endoscopy;  Laterality: N/A;  . HEMORRHOID SURGERY N/A 05/14/2017   Procedure: EXTERNAL THROMBOSED HEMORRHOIDECTOMY;  Surgeon: Robert Bellow, MD;  Location: [facility] ORS;  Service: General;  Laterality: N/A;  . HIP ARTHROPLASTY Right 02/17/2017   Procedure: ARTHROPLASTY BIPOLAR HIP (HEMIARTHROPLASTY);  Surgeon: Claud Kelp, MD;  Location: [facility] ORS;  Service: Orthopedics;  Laterality: Right;  . LEFT HEART CATH AND CORONARY ANGIOGRAPHY N/A 08/31/2016   Procedure: Left Heart Cath and Coronary Angiography;  Surgeon: Wellington Hampshire, MD;  Location: [place] CV LAB;  Service: Cardiovascular;  Laterality: N/A;  . ORIF PERIPROSTHETIC FRACTURE Right 05/27/2017  . ORIF PERIPROSTHETIC FRACTURE Right 05/27/2017   Procedure: OPEN REDUCTION INTERNAL FIXATION (ORIF) PERIPROSTHETIC FRACTURE;  Surgeon: Mcarthur Rossetti, MD;  Location: [place];  Service: Orthopedics;  Laterality: Right;  . TOTAL HIP ARTHROPLASTY Right   . uterian prolapse       Current Outpatient Medications  Medication Sig Dispense Refill  . albuterol (VENTOLIN HFA) 108 (90 Base) MCG/ACT inhaler Inhale 2 puffs into the lungs every 6 (six) hours as needed for wheezing or shortness of breath. 18 g 1  . atorvastatin (LIPITOR) 40 MG tablet TAKE 1 TABLET BY MOUTH DAILY 90 tablet 1  . carvedilol (COREG) 6.25 MG tablet TAKE ONE TABLET TWICE DAILY WITH A MEAL 60 tablet 5  . FLORA-Q (FLORA-Q) CAPS capsule Take 1 capsule by mouth daily.    [name] glipiZIDE (GLUCOTROL) 5 MG tablet TAKE 1 TABLET BY MOUTH TWICE DAILY BEFORE A MEAL 180 tablet 1  . glucose blood (BAYER CONTOUR TEST) test strip Use as instructed 100 each 5  . hydrALAZINE (APRESOLINE) 50 MG tablet 75 MG IN AM AND AFTERNOON AND 50 MG IN THE EVENING    . hydrochlorothiazide (MICROZIDE) 12.5 MG capsule TAKE 1 CAPSULE BY MOUTH EVERY DAY 30 capsule 6  . isosorbide mononitrate (IMDUR) 60 MG 24 hr tablet TAKE 1 TABLET BY  MOUTH DAILY 90 tablet 0  . lansoprazole (PREVACID) 15 MG capsule Take 15 mg by mouth daily as needed.    . lansoprazole (PREVACID) 30 MG capsule TAKE 1 CAPSULE EVERY DAY BEFORE SUPPER 30 capsule 2  . loratadine (CLARITIN) 10 MG tablet Take 10 mg daily by mouth.     . losartan (COZAAR) 100 MG tablet TAKE ONE TABLET BY MOUTH EVERY DAY 90 tablet 1  . nitroGLYCERIN (NITROSTAT) 0.4 MG SL tablet Place 1 tablet (0.4 mg total) under the tongue every 5 (five) minutes as needed for chest pain. 10 tablet 2  . sertraline (ZOLOFT) 50 MG tablet TAKE ONE AND ONE-HALF TABLETS DAILY 140 tablet 1  . traMADol (ULTRAM) 50 MG tablet Take 1-2 tablets (50-100 mg total) by mouth every 6 (six) hours as needed. 30 tablet 0  . Trospium Chloride 60 MG CP24 TAKE ONE CAPSULE EACH MORNING BEFORE BREAKFAST 90 capsule 1  . vitamin C (ASCORBIC ACID) 500 MG tablet Take 1 tablet (500 mg total) by mouth 2 (two) times daily. 60 tablet 2   No current facility-administered  medications for this visit.    Allergies:   Asa [aspirin], Aspirin, Atropine, Belladonna alkaloids, Codeine, Darvon, Darvon [propoxyphene], Demerol [meperidine], Ferrous sulfate, Meperidine and related, Methocarbamol, Penicillins, Sulfa antibiotics, and Iron    Social History:  The patient  reports that she has never smoked. She has never used smokeless tobacco. She reports that she does not drink alcohol and does not use drugs.   Family History:  The patient's family history includes Breast cancer (age of onset: 46) in her sister; Breast cancer (age of onset: 20) in her mother; Cancer in her mother; Colon cancer in her brother, brother, and father; Stroke in her sister.    ROS:  Please see the history of present illness.   Otherwise, review of systems are positive for none.   All other systems are reviewed and negative.    PHYSICAL EXAM: VS:  BP (!) 148/74   Pulse 70   Ht 5' 1.5" (1.562 m)   Wt 138 lb (62.6 kg)   BMI 25.65 kg/m  , BMI Body mass index is  25.65 kg/m. GEN: Well nourished, well developed, in no acute distress  HEENT: normal  Neck: no JVD, carotid bruits, or masses Cardiac: RRR; no rubs, or gallops, mild bilateral leg edema . 3/6 crescendo decrescendo systolic murmur in the aortic area which is mid peaking Respiratory:  clear to auscultation bilaterally, normal work of breathing GI: soft, nontender, nondistended, + BS MS: no deformity or atrophy  Skin: warm and dry, no rash Neuro:  Strength and sensation are intact Psych: euthymic mood, full affect    EKG:  EKG is ordered today. The ekg ordered today demonstrates normal sinus rhythm with PACs and nonspecific ST changes.   Recent Labs: 07/16/2019: Hemoglobin 11.9; Platelets 184.0 09/15/2019: ALT 29; BUN 45; Creatinine, Ser 1.21; Potassium 4.1; Sodium 134    Lipid Panel    Component Value Date/Time   CHOL 145 01/17/2019 1346   TRIG 314.0 (H) 01/17/2019 1346   HDL 32.80 (L) 01/17/2019 1346   CHOLHDL 4 01/17/2019 1346   VLDL 62.8 (H) 01/17/2019 1346   LDLCALC 68 12/04/2016 0808   LDLDIRECT 48.0 07/16/2019 1012      Wt Readings from Last 3 Encounters:  06/03/20 138 lb (62.6 kg)  11/11/19 143 lb 8 oz (65.1 kg)  10/02/19 140 lb (63.5 kg)       No flowsheet data found.    ASSESSMENT AND PLAN:  1.  Coronary artery disease involving native coronary arteries with stable angina: She is doing reasonably well overall with controlled symptoms.  Continue medical therapy.    2.  Moderate aortic stenosis.  Repeat echocardiogram in 6 months from now.  3.  Essential hypertension: Blood pressure is reasonably controlled on current medications  4.  Hyperlipidemia: Continue treatment with atorvastatin.  Most recent lipid profile showed an LDL of 48.  5.  Chronic diastolic heart failure: She appears to be euvolemic.    Disposition:   FU with me in 6 months.  Signed,  [name], MD  06/03/2020 12:53 PM    [facility]

## 2020-06-03 NOTE — Patient Instructions (Signed)
Medication Instructions:  Your physician recommends that you continue on your current medications as directed. Please refer to the Current Medication list given to you today.  *If you need a refill on your cardiac medications before your next appointment, please call your pharmacy*   Lab Work: None ordered If you have labs (blood work) drawn today and your tests are completely normal, you will receive your results only by: [name] MyChart Message (if you have MyChart) OR . A paper copy in the mail If you have any lab test that is abnormal or we need to change your treatment, we will call you to review the results.   Testing/Procedures: Your physician has requested that you have an echocardiogram. Echocardiography is a painless test that uses sound waves to create images of your heart. It provides your doctor with information about the size and shape of your heart and how well your heart's chambers and valves are working. This procedure takes approximately one hour. There are no restrictions for this procedure. (To be scheduled in 6 months)   Follow-Up: At [facility], you and your health needs are our priority.  As part of our continuing mission to provide you with exceptional heart care, we have created designated Provider Care Teams.  These Care Teams include your primary Cardiologist (physician) and Advanced Practice Providers (APPs -  Physician Assistants and Nurse Practitioners) who all work together to provide you with the care you need, when you need it.  We recommend signing up for the patient portal called "MyChart".  Sign up information is provided on this After Visit Summary.  MyChart is used to connect with patients for Virtual Visits (Telemedicine).  Patients are able to view lab/test results, encounter notes, upcoming appointments, etc.  Non-urgent messages can be sent to your provider as well.   To learn more about what you can do with MyChart, go to [email or web address].     Your next appointment:   6 month(s) same days as the echo  The format for your next appointment:   In Person  Provider:   You may see [name], MD or one of the following Advanced Practice Providers on your designated Care Team:    Murray Hodgkins, NP  Christell Faith, PA-C  Marrianne Mood, PA-C  Cadence Cheshire, [place]  Laurann Montana, NP    Other Instructions N/A

## 2020-06-16 ENCOUNTER — Other Ambulatory Visit: Payer: Self-pay

## 2020-06-16 ENCOUNTER — Telehealth: Payer: Self-pay

## 2020-06-16 ENCOUNTER — Other Ambulatory Visit (INDEPENDENT_AMBULATORY_CARE_PROVIDER_SITE_OTHER): Payer: Medicare Other

## 2020-06-16 DIAGNOSIS — R300 Dysuria: Secondary | ICD-10-CM | POA: Diagnosis not present

## 2020-06-16 DIAGNOSIS — N390 Urinary tract infection, site not specified: Secondary | ICD-10-CM

## 2020-06-16 DIAGNOSIS — B952 Enterococcus as the cause of diseases classified elsewhere: Secondary | ICD-10-CM

## 2020-06-16 LAB — URINALYSIS, ROUTINE W REFLEX MICROSCOPIC
Bilirubin Urine: NEGATIVE
Hgb urine dipstick: NEGATIVE
Ketones, ur: NEGATIVE
Nitrite: NEGATIVE
RBC / HPF: NONE SEEN (ref 0–?)
Specific Gravity, Urine: 1.01 (ref 1.000–1.030)
Total Protein, Urine: NEGATIVE
Urine Glucose: NEGATIVE
Urobilinogen, UA: 0.2 (ref 0.0–1.0)
pH: 5.5 (ref 5.0–8.0)

## 2020-06-16 NOTE — Telephone Encounter (Signed)
Labs ordered for lab appt.  

## 2020-06-18 DIAGNOSIS — N390 Urinary tract infection, site not specified: Secondary | ICD-10-CM | POA: Insufficient documentation

## 2020-06-18 DIAGNOSIS — B952 Enterococcus as the cause of diseases classified elsewhere: Secondary | ICD-10-CM | POA: Insufficient documentation

## 2020-06-18 LAB — URINE CULTURE
MICRO NUMBER:: 11541687
SPECIMEN QUALITY:: ADEQUATE

## 2020-06-18 MED ORDER — NITROFURANTOIN MONOHYD MACRO 100 MG PO CAPS: 100.0000 mg | ORAL_CAPSULE | Freq: Two times a day (BID) | ORAL | 0 refills | Status: DC

## 2020-06-18 NOTE — Addendum Note (Signed)
Addended by: [name] on: 06/18/2020 04:58 PM   Modules accepted: Orders

## 2020-06-29 ENCOUNTER — Other Ambulatory Visit: Payer: Self-pay | Admitting: Cardiovascular Disease

## 2020-07-16 ENCOUNTER — Other Ambulatory Visit: Payer: Self-pay | Admitting: Internal Medicine

## 2020-07-20 DIAGNOSIS — H2513 Age-related nuclear cataract, bilateral: Secondary | ICD-10-CM | POA: Diagnosis not present

## 2020-07-20 LAB — HM DIABETES EYE EXAM

## 2020-08-06 ENCOUNTER — Other Ambulatory Visit: Payer: Self-pay | Admitting: Internal Medicine

## 2020-08-12 ENCOUNTER — Other Ambulatory Visit: Payer: Self-pay | Admitting: Internal Medicine

## 2020-08-18 ENCOUNTER — Inpatient Hospital Stay
Admission: EM | Admit: 2020-08-18 | Discharge: 2020-08-20 | DRG: 281 | Disposition: A | Discharge status: Home Health | Payer: Medicare Other | Attending: Internal Medicine | Admitting: Internal Medicine

## 2020-08-18 ENCOUNTER — Encounter: Payer: Self-pay | Admitting: Emergency Medicine

## 2020-08-18 ENCOUNTER — Emergency Department: Payer: Medicare Other

## 2020-08-18 ENCOUNTER — Other Ambulatory Visit: Payer: Self-pay

## 2020-08-18 DIAGNOSIS — J45909 Unspecified asthma, uncomplicated: Secondary | ICD-10-CM | POA: Diagnosis present

## 2020-08-18 DIAGNOSIS — F411 Generalized anxiety disorder: Secondary | ICD-10-CM | POA: Diagnosis present

## 2020-08-18 DIAGNOSIS — Z8616 Personal history of COVID-19: Secondary | ICD-10-CM

## 2020-08-18 DIAGNOSIS — Z882 Allergy status to sulfonamides status: Secondary | ICD-10-CM

## 2020-08-18 DIAGNOSIS — N179 Acute kidney failure, unspecified: Secondary | ICD-10-CM | POA: Diagnosis not present

## 2020-08-18 DIAGNOSIS — E1121 Type 2 diabetes mellitus with diabetic nephropathy: Secondary | ICD-10-CM | POA: Diagnosis present

## 2020-08-18 DIAGNOSIS — I214 Non-ST elevation (NSTEMI) myocardial infarction: Principal | ICD-10-CM | POA: Diagnosis present

## 2020-08-18 DIAGNOSIS — E1122 Type 2 diabetes mellitus with diabetic chronic kidney disease: Secondary | ICD-10-CM | POA: Diagnosis present

## 2020-08-18 DIAGNOSIS — Z96641 Presence of right artificial hip joint: Secondary | ICD-10-CM | POA: Diagnosis present

## 2020-08-18 DIAGNOSIS — I130 Hypertensive heart and chronic kidney disease with heart failure and stage 1 through stage 4 chronic kidney disease, or unspecified chronic kidney disease: Secondary | ICD-10-CM | POA: Diagnosis not present

## 2020-08-18 DIAGNOSIS — F32A Depression, unspecified: Secondary | ICD-10-CM | POA: Diagnosis present

## 2020-08-18 DIAGNOSIS — I252 Old myocardial infarction: Secondary | ICD-10-CM

## 2020-08-18 DIAGNOSIS — Z888 Allergy status to other drugs, medicaments and biological substances status: Secondary | ICD-10-CM

## 2020-08-18 DIAGNOSIS — R079 Chest pain, unspecified: Secondary | ICD-10-CM | POA: Diagnosis not present

## 2020-08-18 DIAGNOSIS — I2510 Atherosclerotic heart disease of native coronary artery without angina pectoris: Secondary | ICD-10-CM | POA: Diagnosis present

## 2020-08-18 DIAGNOSIS — Z7984 Long term (current) use of oral hypoglycemic drugs: Secondary | ICD-10-CM

## 2020-08-18 DIAGNOSIS — I5032 Chronic diastolic (congestive) heart failure: Secondary | ICD-10-CM | POA: Diagnosis present

## 2020-08-18 DIAGNOSIS — K219 Gastro-esophageal reflux disease without esophagitis: Secondary | ICD-10-CM | POA: Diagnosis present

## 2020-08-18 DIAGNOSIS — I160 Hypertensive urgency: Secondary | ICD-10-CM | POA: Diagnosis present

## 2020-08-18 DIAGNOSIS — D509 Iron deficiency anemia, unspecified: Secondary | ICD-10-CM | POA: Diagnosis present

## 2020-08-18 DIAGNOSIS — R0989 Other specified symptoms and signs involving the circulatory and respiratory systems: Secondary | ICD-10-CM

## 2020-08-18 DIAGNOSIS — Z66 Do not resuscitate: Secondary | ICD-10-CM | POA: Diagnosis present

## 2020-08-18 DIAGNOSIS — H9192 Unspecified hearing loss, left ear: Secondary | ICD-10-CM | POA: Diagnosis present

## 2020-08-18 DIAGNOSIS — J849 Interstitial pulmonary disease, unspecified: Secondary | ICD-10-CM | POA: Diagnosis present

## 2020-08-18 DIAGNOSIS — Z885 Allergy status to narcotic agent status: Secondary | ICD-10-CM

## 2020-08-18 DIAGNOSIS — E785 Hyperlipidemia, unspecified: Secondary | ICD-10-CM | POA: Diagnosis present

## 2020-08-18 DIAGNOSIS — N183 Chronic kidney disease, stage 3 unspecified: Secondary | ICD-10-CM | POA: Diagnosis present

## 2020-08-18 DIAGNOSIS — Z79899 Other long term (current) drug therapy: Secondary | ICD-10-CM

## 2020-08-18 DIAGNOSIS — Z20822 Contact with and (suspected) exposure to covid-19: Secondary | ICD-10-CM | POA: Diagnosis present

## 2020-08-18 DIAGNOSIS — R296 Repeated falls: Secondary | ICD-10-CM | POA: Diagnosis present

## 2020-08-18 DIAGNOSIS — Z881 Allergy status to other antibiotic agents status: Secondary | ICD-10-CM

## 2020-08-18 DIAGNOSIS — Z886 Allergy status to analgesic agent status: Secondary | ICD-10-CM

## 2020-08-18 DIAGNOSIS — F0390 Unspecified dementia without behavioral disturbance: Secondary | ICD-10-CM | POA: Diagnosis present

## 2020-08-18 DIAGNOSIS — E1151 Type 2 diabetes mellitus with diabetic peripheral angiopathy without gangrene: Secondary | ICD-10-CM | POA: Diagnosis present

## 2020-08-18 DIAGNOSIS — J9811 Atelectasis: Secondary | ICD-10-CM | POA: Diagnosis not present

## 2020-08-18 DIAGNOSIS — I10 Essential (primary) hypertension: Secondary | ICD-10-CM | POA: Diagnosis present

## 2020-08-18 DIAGNOSIS — I5189 Other ill-defined heart diseases: Secondary | ICD-10-CM

## 2020-08-18 DIAGNOSIS — Z993 Dependence on wheelchair: Secondary | ICD-10-CM

## 2020-08-18 DIAGNOSIS — N1832 Chronic kidney disease, stage 3b: Secondary | ICD-10-CM | POA: Diagnosis present

## 2020-08-18 DIAGNOSIS — I350 Nonrheumatic aortic (valve) stenosis: Secondary | ICD-10-CM | POA: Diagnosis present

## 2020-08-18 DIAGNOSIS — Z880 Allergy status to penicillin: Secondary | ICD-10-CM

## 2020-08-18 DIAGNOSIS — N189 Chronic kidney disease, unspecified: Secondary | ICD-10-CM | POA: Diagnosis present

## 2020-08-18 DIAGNOSIS — R0789 Other chest pain: Secondary | ICD-10-CM | POA: Diagnosis not present

## 2020-08-18 HISTORY — DX: Prediabetes: R73.03

## 2020-08-18 LAB — TROPONIN I (HIGH SENSITIVITY)
Troponin I (High Sensitivity): 1663 ng/L (ref <18)
Troponin I (High Sensitivity): 1624 ng/L (ref <18)

## 2020-08-18 LAB — BASIC METABOLIC PANEL
Anion gap: 10 (ref 5–15)
BUN: 32 mg/dL — ABNORMAL HIGH (ref 8–23)
CO2: 23 mmol/L (ref 22–32)
Calcium: 9.5 mg/dL (ref 8.9–10.3)
Chloride: 103 mmol/L (ref 98–111)
Creatinine, Ser: 1.2 mg/dL — ABNORMAL HIGH (ref 0.44–1.00)
GFR, Estimated: 42 mL/min — ABNORMAL LOW (ref >60)
Glucose, Bld: 152 mg/dL — ABNORMAL HIGH (ref 70–99)
Potassium: 3.3 mmol/L — ABNORMAL LOW (ref 3.5–5.1)
Sodium: 136 mmol/L (ref 135–145)

## 2020-08-18 LAB — CBC
HCT: 35.1 % — ABNORMAL LOW (ref 36.0–46.0)
Hemoglobin: 12.1 g/dL (ref 12.0–15.0)
MCH: 28.5 pg (ref 26.0–34.0)
MCHC: 34.5 g/dL (ref 30.0–36.0)
MCV: 82.6 fL (ref 80.0–100.0)
Platelets: 171 10*3/uL (ref 150–400)
RBC: 4.25 MIL/uL (ref 3.87–5.11)
RDW: 13.3 % (ref 11.5–15.5)
WBC: 9.1 10*3/uL (ref 4.0–10.5)
nRBC: 0 % (ref 0.0–0.2)

## 2020-08-18 LAB — APTT: aPTT: 33 seconds (ref 24–36)

## 2020-08-18 LAB — PROTIME-INR
INR: 1.1 (ref 0.8–1.2)
Prothrombin Time: 14.5 seconds (ref 11.4–15.2)

## 2020-08-18 LAB — MAGNESIUM: Magnesium: 1.6 mg/dL — ABNORMAL LOW (ref 1.7–2.4)

## 2020-08-18 MED ORDER — HYDRALAZINE HCL 50 MG PO TABS
75.0000 mg | ORAL_TABLET | ORAL | Status: DC
  Administered 2020-08-19 – 2020-08-20 (×3): 75 mg via ORAL
  Filled 2020-08-18 (×3): qty 1

## 2020-08-18 MED ORDER — ASCORBIC ACID 500 MG PO TABS
500.0000 mg | ORAL_TABLET | Freq: Two times a day (BID) | ORAL | Status: DC
  Administered 2020-08-19 – 2020-08-20 (×3): 500 mg via ORAL
  Filled 2020-08-18 (×3): qty 1

## 2020-08-18 MED ORDER — NITROGLYCERIN 0.4 MG SL SUBL
0.4000 mg | SUBLINGUAL_TABLET | Freq: Once | SUBLINGUAL | Status: AC
  Administered 2020-08-18: 0.4 mg via SUBLINGUAL
  Filled 2020-08-18: qty 1

## 2020-08-18 MED ORDER — ACETAMINOPHEN 650 MG RE SUPP: 650.0000 mg | Freq: Four times a day (QID) | RECTAL | Status: DC | PRN

## 2020-08-18 MED ORDER — LABETALOL HCL 5 MG/ML IV SOLN
10.0000 mg | Freq: Once | INTRAVENOUS | Status: DC
  Filled 2020-08-18: qty 4

## 2020-08-18 MED ORDER — MAGNESIUM SULFATE 4 GM/100ML IV SOLN
4.0000 g | Freq: Once | INTRAVENOUS | Status: AC
  Administered 2020-08-18: 4 g via INTRAVENOUS
  Filled 2020-08-18: qty 100

## 2020-08-18 MED ORDER — ATORVASTATIN CALCIUM 20 MG PO TABS
40.0000 mg | ORAL_TABLET | Freq: Every day | ORAL | Status: DC
  Administered 2020-08-19 – 2020-08-20 (×2): 40 mg via ORAL
  Filled 2020-08-18 (×2): qty 2

## 2020-08-18 MED ORDER — HEPARIN BOLUS VIA INFUSION
3600.0000 [IU] | Freq: Once | INTRAVENOUS | Status: AC
  Administered 2020-08-18: 3600 [IU] via INTRAVENOUS
  Filled 2020-08-18: qty 3600

## 2020-08-18 MED ORDER — LORATADINE 10 MG PO TABS
10.0000 mg | ORAL_TABLET | Freq: Every day | ORAL | Status: DC
  Administered 2020-08-19 – 2020-08-20 (×2): 10 mg via ORAL
  Filled 2020-08-18 (×2): qty 1

## 2020-08-18 MED ORDER — MORPHINE SULFATE (PF) 2 MG/ML IV SOLN
1.0000 mg | INTRAVENOUS | Status: DC | PRN
Start: 2020-08-18 — End: 2020-08-19

## 2020-08-18 MED ORDER — HYDRALAZINE HCL 50 MG PO TABS: 50.0000 mg | ORAL_TABLET | ORAL | Status: DC

## 2020-08-18 MED ORDER — PANTOPRAZOLE SODIUM 40 MG PO TBEC
40.0000 mg | DELAYED_RELEASE_TABLET | Freq: Every day | ORAL | Status: DC
  Administered 2020-08-19: 40 mg via ORAL
  Filled 2020-08-18: qty 1

## 2020-08-18 MED ORDER — TRAMADOL HCL 50 MG PO TABS: 50.0000 mg | ORAL_TABLET | Freq: Two times a day (BID) | ORAL | Status: DC | PRN

## 2020-08-18 MED ORDER — CARVEDILOL 6.25 MG PO TABS
6.2500 mg | ORAL_TABLET | Freq: Two times a day (BID) | ORAL | Status: DC
  Administered 2020-08-18 – 2020-08-19 (×2): 6.25 mg via ORAL
  Filled 2020-08-18 (×2): qty 1

## 2020-08-18 MED ORDER — ISOSORBIDE MONONITRATE ER 60 MG PO TB24
60.0000 mg | ORAL_TABLET | Freq: Every day | ORAL | Status: DC
  Administered 2020-08-19 – 2020-08-20 (×2): 60 mg via ORAL
  Filled 2020-08-18 (×2): qty 1

## 2020-08-18 MED ORDER — ACETAMINOPHEN 325 MG PO TABS
650.0000 mg | ORAL_TABLET | Freq: Four times a day (QID) | ORAL | Status: DC | PRN
  Administered 2020-08-18: 650 mg via ORAL
  Filled 2020-08-18: qty 2

## 2020-08-18 MED ORDER — HYDRALAZINE HCL 50 MG PO TABS
50.0000 mg | ORAL_TABLET | Freq: Every evening | ORAL | Status: DC
  Administered 2020-08-19: 50 mg via ORAL
  Filled 2020-08-18: qty 1

## 2020-08-18 MED ORDER — ALBUTEROL SULFATE HFA 108 (90 BASE) MCG/ACT IN AERS
2.0000 | INHALATION_SPRAY | Freq: Four times a day (QID) | RESPIRATORY_TRACT | Status: DC | PRN
  Filled 2020-08-18: qty 6.7

## 2020-08-18 MED ORDER — POTASSIUM CHLORIDE 10 MEQ/100ML IV SOLN
10.0000 meq | INTRAVENOUS | Status: DC
  Filled 2020-08-18 (×3): qty 100

## 2020-08-18 MED ORDER — SERTRALINE HCL 50 MG PO TABS
75.0000 mg | ORAL_TABLET | Freq: Every day | ORAL | Status: DC
  Administered 2020-08-19 – 2020-08-20 (×2): 75 mg via ORAL
  Filled 2020-08-18 (×2): qty 2

## 2020-08-18 MED ORDER — HEPARIN (PORCINE) 25000 UT/250ML-% IV SOLN
750.0000 [IU]/h | INTRAVENOUS | Status: DC
  Administered 2020-08-18: 750 [IU]/h via INTRAVENOUS
  Filled 2020-08-18: qty 250

## 2020-08-18 MED ORDER — ONDANSETRON HCL 4 MG PO TABS: 4.0000 mg | ORAL_TABLET | Freq: Four times a day (QID) | ORAL | Status: DC | PRN

## 2020-08-18 MED ORDER — ONDANSETRON HCL 4 MG/2ML IJ SOLN: 4.0000 mg | Freq: Four times a day (QID) | INTRAMUSCULAR | Status: DC | PRN

## 2020-08-18 NOTE — ED Notes (Signed)
Pt unable to rate pain, states "I guess it's the same as before." Denies relief with nitro.

## 2020-08-18 NOTE — H&P (Signed)
History and Physical   [name] QPY:[id number] DOB: [birth year] DOA: 08/18/2020  PCP: [name], MD  Outpatient Specialists: Dr. Fletcher Anon, cardiology Patient coming from: Home  I have personally briefly reviewed patient's old medical records in [place].  Chief Concern: Chest pressure  HPI: Maureen Morales is a 87 y.o. female with medical history significant for coronary artery disease, hypertension, hyperlipidemia, history of NSTEMI in May 2018, non-insulin-dependent diabetes mellitus, aortic stenosis, history of right hip fracture in 1245, grade 2 diastolic dysfunction, history of asthma, history of nonobstructive coronary artery disease, history of H. pylori positive, presents to the emergency department from home via private vehicle for chief concerns of chest pressure.  Patient states that her chest pressure started when she woke up this morning. She denies chest pain. She states the chest pressure is persistent with minimal improvement of nitroglycerin and hydralazine at home.  She denies upper extremity numbness and tingling or pain.  She denies abnormal taste and or jaw and neck discomfort.  She states that her mouth just feels dry.  She denies shortness of breath, abdominal pain, dysuria, hematuria, syncope, loss of consciousness.  She does deny a mild headache.  At home patient, per documented, at 4:10 PM: Blood pressure 205/97, 4:15 PM nitroglycerin once, 440 blood pressure 170/90, 4:47 PM hydralazine 25 mg once, at 5:32 PM nitroglycerin once.  At bedside, patient was able to tell me her name, her age of 63, and that she is in the hospital.  She is able to identify her daughter, Maureen Morales at bedside.  Patient is aware, awake, alert.  She states that she is having some chest pressure however this is significantly improved from her previous chest pressure when she woke up.  Social history: She lives at home by herself.  Her daughter lives across from her on the same street.  She  denies tobacco, EtOH, recreational drug use.  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  Vaccination: Patient is vaccinated for COVID-19, 3 Pfizer doses.  ROS: Constitutional: no weight change, no fever ENT/Mouth: no sore throat, no rhinorrhea Eyes: no eye pain, no vision changes Cardiovascular: + chest pain, no dyspnea,  no edema, no palpitations Respiratory: no cough, no sputum, no wheezing Gastrointestinal: no nausea, no vomiting, no diarrhea, no constipation Genitourinary: no urinary incontinence, no dysuria, no hematuria Musculoskeletal: no arthralgias, no myalgias Skin: no skin lesions, no pruritus, Neuro: + weakness, no loss of consciousness, no syncope Psych: no anxiety, no depression, + decrease appetite Heme/Lymph: no bruising, no bleeding  ED Course: Discussed with ED provider, patient requiring hospitalization due to NSTEMI.  Vitals in the emergency department was remarkable for temperature of 98, respiration rate of 21, heart rate 79, blood pressure initially 194/90, SPO2 at 97% on room air.  Labs in the emergency department was remarkable for for high sensitivity troponin of 1663.  Sodium 136, potassium 3.3, chloride 103, bicarb 23, BUN 32, serum creatinine 1.20, nonfasting blood glucose 152, WBC 9.1, hemoglobin 12.1, platelets 171.  ED provider gave nitroglycerin 0.5 sublingual once.  ED provider did order labetalol 10 mg IV once, however this was not given as patient's blood pressure improved.  Assessment/Plan  Principal Problem:   NSTEMI (non-ST elevated myocardial infarction) [facility]) Active Problems:   Essential hypertension   Type II diabetes mellitus with nephropathy (HCC)   Chronic interstitial lung disease (HCC)   Anemia, iron deficiency   Generalized anxiety disorder   Recurrent falls   AKI (acute  kidney injury) ([place])   Stage 3 chronic kidney disease (HCC)   Chronic diastolic congestive heart failure (HCC)   Diastolic dysfunction  without heart failure   # NSTEMI- suspect cardiac origin - Cardiology, Dr. Johnsie Cancel has been consulted via secure chat and is aware, requesting n.p.o. after midnight - Continue heparin gtt. - Cardiology recommends that if SBP is greater than 160 mmHg, Dr. Johnsie Cancel recommends start nitroglycerin gtt., communicated with cross coverage provider via secure chat - No aspirin was given as aspirin causes patient to swell, anaphylactic shock - Morphine 1 mg IV every 4 hours.  For severe pain and moderate pain, 2 doses ordered  # Heart failure preserved ejection fraction-resumed carvedilol 6.25 mg p.o. twice daily, isosorbide mononitrate 60 mg daily -Grade 2 diastolic dysfunction, echo on 11/04/2019 -Evening dose of these medications were resumed  # Hypertension-resumed hydralazine 50 mg to 75 mg, per administration home dosing, carvedilol 6.25 mg twice daily, isosorbide mononitrate 60 mg daily  # Hyperlipidemia-atorvastatin 40 mg p.o. daily resumed  # Depression-sertraline 75 mg p.o. daily resumed  # GERD-pantoprazole 40 mg daily resumed  # Resumed home dose of tramadol 50- 100 mg every 12 hours.  For moderate pain  # Baseline mild dementia- patient would benefit from having family stay with her overnight to prevent delirium and sundowning  Chart reviewed.   08/31/2016: Findings: Left ventricular systolic function is normal, LV end-diastolic pressure moderately elevated, left ventricular ejection fraction is 55 to 65% by visual estimate, mid RCA lesion 60% stenosis, distal LAD lesion 40% stenosis, mid LAD lesion 30% stenosis.  Mild to moderate nonobstructive coronary artery disease, normal LV systolic function, mild aortic stenosis with peak to peak gradient of 15 mmHg, mild LVOT gradient possible.  Severely elevated systemic pressure with moderately elevated left ventricular end-diastolic pressure.  No PCI and recommendation was for Plavix for 1 year.  11/04/2019: Complete echo showing ejection  fraction 70 to 75%.  The left ventricle has hyperdynamic function.  The left ventricle has no regional wall motion abnormalities.  Moderate asymmetric left ventricular hypertrophy of the basal septal segment.  Left ventricular diastolic parameters consistent with grade 2 diastolic dysfunction.  DVT prophylaxis: Heparin GGT Code Status: DNR Diet: Heart healthy now, n.p.o. at midnight Family Communication: Updated daughter, Maureen Morales at bedside Disposition Plan: Pending clinical course Consults called: Cardiology Admission status: Progressive cardiac, observation, with telemetry  Past Medical History:  Diagnosis Date  . Aortic stenosis    a. 08/2016 Echo: Hyperdynamic LV fxn, mild AS (mean grad 73mmHg), mild MR, mild PAH; b. 05/2017 Echo: Hyperdynamic LV fxn, mod AS (mean grad 70mmHg, valve area 1.84). PASP 130mmHg.  . Asthma without status asthmaticus    unspecified  . Closed right hip fracture ([place]) 05/26/2017  . Colitis    unspecified  . Complication of anesthesia    sensitive to anesthesia  . Depression   . Diabetes mellitus type 2, uncomplicated ([place])   . Essential hypertension   . GERD (gastroesophageal reflux disease)   . 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 Hyperlipidemia    unspecified  . Hypertension    a. 08/2017 Renal U/S: No RAS.  [name] Low back pain with left-sided sciatica 04/29/2018  . Myocardial infarction ([place]) 2018  . Non-obstructive CAD (coronary artery disease)    a. 08/2016 NSTEMI/Cath: mild to mod nonobs dzs including 60% mRCA stenosis-->Med rx.  . NSTEMI (non-ST elevated myocardial infarction) ([place]) 08/30/2016  . Nummular eczema 01/16/2011   Occurring on  chest and under breast.  Treated by Dr Evorn Gong with fluocinonide 0.05% topical 06/26/12   . Peripheral vascular disease ([place])   . 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 Positive H. pylori test   . Suspected COVID-19 virus infection 01/10/2019   Patient reporting malaise  and headache for the past 3 days,  Similar to presentation when she was positive for  COVID 19 INFECTION  July 2 .  Previous inection was caused by exposure to COVID positive caregiver.  Repeat scenario:  Same caregiver's mother tested positive on Monday and caregiver saw current patient on Tuesday    . Viral gastroenteritis due to [place]-like agent Nov 2012   Past Surgical History:  Procedure Laterality Date  . ABDOMINAL HYSTERECTOMY    . BLADDER SURGERY    . CARDIAC CATHETERIZATION    . CHOLECYSTECTOMY    . COLONOSCOPY     06/16/1987, 02/20/1995, 05/04/1999, 02/14/2005, 05/17/2007  . ESOPHAGOGASTRODUODENOSCOPY     05/11/1987, 06/13/1995, 10/09/1995, 05/04/1999, 01/16/2002  . FLEXIBLE SIGMOIDOSCOPY N/A 05/02/2017   Procedure: FLEXIBLE SIGMOIDOSCOPY;  Surgeon: Manya Silvas, MD;  [facility] END[facility]COPY;  Service: Endoscopy;  Laterality: N/A;  . HEMORRHOID SURGERY N/A 05/14/2017   Procedure: EXTERNAL THROMBOSED HEMORRHOIDECTOMY;  Surgeon: Robert Bellow, MD;  Location: [facility] ORS;  Service: General;  Laterality: N/A;  . HIP ARTHROPLASTY Right 02/17/2017   Procedure: ARTHROPLASTY BIPOLAR HIP (HEMIARTHROPLASTY);  Surgeon: Claud Kelp, MD;  Location: [facility] ORS;  Service: Orthopedics;  Laterality: Right;  . LEFT HEART CATH AND CORONARY ANGIOGRAPHY N/A 08/31/2016   Procedure: Left Heart Cath and Coronary Angiography;  Surgeon: Wellington Hampshire, MD;  Location: [place] CV LAB;  Service: Cardiovascular;  Laterality: N/A;  . ORIF PERIPROSTHETIC FRACTURE Right 05/27/2017  . ORIF PERIPROSTHETIC FRACTURE Right 05/27/2017   Procedure: OPEN REDUCTION INTERNAL FIXATION (ORIF) PERIPROSTHETIC FRACTURE;  Surgeon: Mcarthur Rossetti, MD;  Location: [place];  Service: Orthopedics;  Laterality: Right;  . TOTAL HIP ARTHROPLASTY Right   . uterian prolapse     Social History:  reports that she has never smoked. She has never used smokeless tobacco. She reports that she does not drink alcohol and does  not use drugs.  Allergies  Allergen Reactions  . Asa [Aspirin] Anaphylaxis  . Aspirin Swelling  . Atropine Other (See Comments)    Reaction: unknown  . Belladonna Alkaloids Other (See Comments)    Reaction: unknown  . Codeine Other (See Comments)    Reaction: unknown  . Darvon Other (See Comments)    Reaction: unknown  . Darvon [Propoxyphene] Nausea And Vomiting  . Demerol [Meperidine] Nausea And Vomiting  . Ferrous Sulfate Diarrhea  . Meperidine And Related   . Methocarbamol Other (See Comments)    Reaction: unknown  . Penicillins Other (See Comments)    Pt and family unsure of details  . Sulfa Antibiotics Other (See Comments)    Reaction: unknown  . Iron Other (See Comments)    High dose prescription gives her diarrhea   Family History  Problem Relation Age of Onset  . Breast cancer Mother 53  . Colon cancer Father   . Stroke Sister   . Cancer Mother   . Breast cancer Sister 86  . Colon cancer Brother   . Colon cancer Brother    Family history: Family history reviewed and not pertinent  Prior to Admission medications   Medication Sig Start Date End Date Taking? Authorizing Provider  albuterol (VENTOLIN HFA) 108 (90 Base)  MCG/ACT inhaler Inhale 2 puffs into the lungs every 6 (six) hours as needed for wheezing or shortness of breath. 02/13/19   Tyler Pita, MD  atorvastatin (LIPITOR) 40 MG tablet TAKE 1 TABLET BY MOUTH DAILY 03/16/20   [name], MD  carvedilol (COREG) 6.25 MG tablet TAKE ONE TABLET TWICE DAILY WITH A MEAL 05/28/20   Wellington Hampshire, MD  FLORA-Q [facility]) CAPS capsule Take 1 capsule by mouth daily.    [provider]  glipiZIDE (GLUCOTROL) 5 MG tablet TAKE 1 TABLET BY MOUTH TWICE DAILY BEFORE A MEAL 08/06/20   [name], MD  glucose blood (BAYER CONTOUR TEST) test strip Use as instructed 03/26/18   [name], MD  hydrALAZINE (APRESOLINE) 50 MG tablet 75 MG IN AM AND AFTERNOON AND 50 MG IN THE EVENING    [provider]  hydrochlorothiazide (MICROZIDE) 12.5 MG capsule TAKE 1 CAPSULE BY MOUTH EVERY DAY 08/12/20   [name], MD  isosorbide mononitrate (IMDUR) 60 MG 24 hr tablet TAKE 1 TABLET BY MOUTH DAILY 06/29/20   Wellington Hampshire, MD  lansoprazole (PREVACID) 15 MG capsule Take 15 mg by mouth daily as needed.    [provider]  lansoprazole (PREVACID) 30 MG capsule TAKE 1 CAPSULE EVERY DAY BEFORE SUPPER 12/22/19   Tyler Pita, MD  loratadine (CLARITIN) 10 MG tablet Take 10 mg daily by mouth.     [provider]  losartan (COZAAR) 100 MG tablet TAKE ONE TABLET BY MOUTH EVERY DAY 03/30/20   [name], MD  nitrofurantoin, macrocrystal-monohydrate, (MACROBID) 100 MG capsule Take 1 capsule (100 mg total) by mouth 2 (two) times daily. 06/18/20   [name], MD  nitroGLYCERIN (NITROSTAT) 0.4 MG SL tablet Place 1 tablet (0.4 mg total) under the tongue every 5 (five) minutes as needed for chest pain. 07/29/18   [name], MD  sertraline (ZOLOFT) 50 MG tablet TAKE ONE AND ONE-HALF TABLETS DAILY 08/06/20   [name], MD  traMADol (ULTRAM) 50 MG tablet Take 1-2 tablets (50-100 mg total) by mouth every 6 (six) hours as needed. 12/22/19   Mcarthur Rossetti, MD  Trospium Chloride 60 MG CP24 TAKE ONE CAPSULE EACH MORNING BEFORE BREAKFAST 01/06/20   [name], MD  vitamin C (ASCORBIC ACID) 500 MG tablet Take 1 tablet (500 mg total) by mouth 2 (two) times daily. 05/04/17   [name], MD   Physical Exam: Vitals:   08/18/20 1900 08/18/20 1945 08/18/20 2000 08/18/20 2100  BP: (!) 140/100 (!) 194/90 (!) 154/82 (!) 156/76  Pulse: 84 79 83 75  Resp: (!) 28 (!) 21 (!) 21 20  Temp:      TempSrc:      SpO2: 98% 97% 96% 97%  Weight:      Height:       Constitutional: appears age-appropriate, NAD, calm, comfortable Eyes: PERRL, lids and conjunctivae normal ENMT: Mucous membranes are moist. Posterior pharynx clear of any exudate or lesions. Age-appropriate  dentition.  Moderate hearing loss Neck: normal, supple, no masses, no thyromegaly Respiratory: clear to auscultation bilaterally, no wheezing, no crackles. Normal respiratory effort. No accessory muscle use.  Cardiovascular: Regular rate and rhythm, no murmurs / rubs / gallops. No extremity edema. 2+ pedal pulses. No carotid bruits.  Abdomen: no tenderness, no masses palpated, no hepatosplenomegaly. Bowel sounds positive.  Musculoskeletal: no clubbing / cyanosis. No joint deformity upper and lower extremities. Good ROM, no contractures, no  atrophy. Normal muscle tone.  Skin: no rashes, lesions, ulcers. No induration Neurologic: Sensation intact. Strength 5/5 in all 4.  Psychiatric: Normal judgment and insight. Alert and oriented x 3. Normal mood.   EKG: independently reviewed, showing sinus rhythm with rate of 83, QTc 483.  No change when compared to EKG scanned into the system on 11/28/2018.  Chest x-ray on Admission: I personally reviewed and I agree with radiologist reading as below.  DG Chest 2 View  Result Date: 08/18/2020 CLINICAL DATA:  Substernal chest pain for 1 day EXAM: CHEST - 2 VIEW COMPARISON:  07/16/2019 FINDINGS: Cardiac shadow is at the upper limits of normal in size but accentuated by the frontal technique. Aortic calcifications are seen. The lungs are well aerated bilaterally. Minimal basilar atelectasis is seen without focal confluent infiltrate. No sizable effusion is noted. Degenerative changes of the thoracic spine are noted. IMPRESSION: Mild bibasilar atelectasis. Electronically Signed   By: Inez Catalina M.D.   On: 08/18/2020 19:41   Labs on Admission: I have personally reviewed following labs  CBC: Recent Labs  Lab 08/18/20 1836  WBC 9.1  HGB 12.1  HCT 35.1*  MCV 82.6  PLT 654   Basic Metabolic Panel: Recent Labs  Lab 08/18/20 1836  NA 136  K 3.3*  CL 103  CO2 23  GLUCOSE 152*  BUN 32*  CREATININE 1.20*  CALCIUM 9.5  MG 1.6*   GFR: Estimated  Creatinine Clearance: 23.9 mL/min (A) (by C-G formula based on SCr of 1.2 mg/dL (H)).  Coagulation Profile: Recent Labs  Lab 08/18/20 1954  INR 1.1   Urine analysis:    Component Value Date/Time   COLORURINE YELLOW 06/16/2020 0857   APPEARANCEUR CLEAR 06/16/2020 0857   LABSPEC 1.010 06/16/2020 0857   PHURINE 5.5 06/16/2020 0857   GLUCOSEU NEGATIVE 06/16/2020 0857   HGBUR NEGATIVE 06/16/2020 0857   BILIRUBINUR NEGATIVE 06/16/2020 0857   BILIRUBINUR NEG 05/03/2020 1713   [place] 06/16/2020 0857   PROTEINUR Negative 05/03/2020 1713   PROTEINUR 30 (A) 11/08/2017 1318   UROBILINOGEN 0.2 06/16/2020 0857   NITRITE NEGATIVE 06/16/2020 0857   LEUKOCYTESUR MODERATE (A) 06/16/2020 0857   E[place] N Hasnain Manheim D.O. [facility]  If 7PM-7AM, please contact overnight-coverage provider If 7AM-7PM, please contact day coverage provider [email or web address]  08/18/2020, 10:05 PM

## 2020-08-18 NOTE — ED Notes (Signed)
Per ED charge RN, pt to be brought to floor at this time

## 2020-08-18 NOTE — ED Triage Notes (Signed)
Pt to ED via POV, pt c/o substernal CP that started earlier today, per daughter pt took 3 SL nitro PTA, pt denies change in pain level. Pt's daughter reports EMS came to check patient out prior to coming to ED, was instructed by PCP to come for blood work.

## 2020-08-18 NOTE — ED Provider Notes (Addendum)
[facility] Emergency Department Provider Note   ____________________________________________   Event Date/Time   First MD Initiated Contact with Patient 08/18/20 1833     (approximate)  I have reviewed the triage vital signs and the nursing notes.   HISTORY  Chief Complaint Chest Pain    HPI Maureen Morales is a 87 y.o. female with PMH of hypertension, hyperlipidemia, diabetes, CAD, CKD, aortic stenosis, diastolic CHF, and interstitial lung disease who presents to the ED complaining of chest pain.  Patient is very hard of hearing and majority of history is obtained from her daughter.  Daughter states that patient has been complaining of pain in her chest intermittently since this morning.  Pain is described as a heaviness in the center of her chest that is not exacerbated or alleviated by anything in particular.  Family is also noted that her blood pressure has been markedly elevated.  They have been speaking with patient's PCP and have tried both nitroglycerin and hydralazine without significant relief.  Patient denies any fevers, cough, shortness of breath, pain or swelling in her legs.  Patient states that the pain in her chest is much improved after arriving here in the ED.        Past Medical History:  Diagnosis Date  . Aortic stenosis    a. 08/2016 Echo: Hyperdynamic LV fxn, mild AS (mean grad 78mmHg), mild MR, mild PAH; b. 05/2017 Echo: Hyperdynamic LV fxn, mod AS (mean grad 74mmHg, valve area 1.84). PASP 121mmHg.  . Asthma without status asthmaticus    unspecified  . Closed right hip fracture ([place]) 05/26/2017  . Colitis    unspecified  . Complication of anesthesia    sensitive to anesthesia  . Depression   . Diabetes mellitus type 2, uncomplicated ([place])   . Essential hypertension   . GERD (gastroesophageal reflux disease)   . 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 Hyperlipidemia    unspecified  . Hypertension    a. 08/2017 Renal U/S: No  RAS.  [name] Low back pain with left-sided sciatica 04/29/2018  . Myocardial infarction ([place]) 2018  . Non-obstructive CAD (coronary artery disease)    a. 08/2016 NSTEMI/Cath: mild to mod nonobs dzs including 60% mRCA stenosis-->Med rx.  . NSTEMI (non-ST elevated myocardial infarction) ([place]) 08/30/2016  . Nummular eczema 01/16/2011   Occurring on chest and under breast.  Treated by Dr Evorn Gong with fluocinonide 0.05% topical 06/26/12   . Peripheral vascular disease ([place])   . 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 Positive H. pylori test   . Suspected COVID-19 virus infection 01/10/2019   Patient reporting malaise and headache for the past 3 days,  Similar to presentation when she was positive for  COVID 19 INFECTION  July 2 .  Previous inection was caused by exposure to COVID positive caregiver.  Repeat scenario:  Same caregiver's mother tested positive on Monday and caregiver saw current patient on Tuesday    . Viral gastroenteritis due to [place]-like agent Nov 2012    Patient Active Problem List   Diagnosis Date Noted  . Enterococcus UTI 06/18/2020  . E. coli UTI (urinary tract infection) 05/06/2020  . Shingles rash 03/04/2020  . Impetigo herpetiformis 03/02/2020  . Acute cystitis 02/26/2020  . Urinary incontinence, nocturnal enuresis 01/17/2020  . Acute shoulder pain due to trauma, left 01/06/2020  . Elevated ALT measurement 07/17/2019  . Moderate tricuspid valve regurgitation 09/03/2017  . Diastolic  dysfunction without heart failure 09/03/2017  . Labile blood pressure   . Stage 3 chronic kidney disease ([place])   . Chronic diastolic congestive heart failure ([place])   . History of right hip hemiarthroplasty   . Diabetes mellitus type 2 in nonobese (HCC)   . Recurrent falls 05/26/2017  . Normocytic anemia 05/26/2017  . CAD (coronary artery disease) 05/26/2017  . Generalized anxiety disorder 09/07/2016  . Unsteady gait 09/07/2016  . S/P laparoscopic  cholecystectomy 12/15/2013  . Encounter for preventive health examination 11/13/2013  . Spondylosis of cervical spine 01/27/2013  . Anemia, iron deficiency 01/21/2013  . Moderate aortic stenosis by prior echocardiogram 05/06/2012  . Chronic interstitial lung disease ([place]) 07/04/2011  . Hemorrhoids, internal 04/05/2011  . Essential hypertension   . Type II diabetes mellitus with nephropathy ([place])   . Hyperlipidemia     Past Surgical History:  Procedure Laterality Date  . ABDOMINAL HYSTERECTOMY    . BLADDER SURGERY    . CARDIAC CATHETERIZATION    . CHOLECYSTECTOMY    . COLONOSCOPY     06/16/1987, 02/20/1995, 05/04/1999, 02/14/2005, 05/17/2007  . ESOPHAGOGASTRODUODENOSCOPY     05/11/1987, 06/13/1995, 10/09/1995, 05/04/1999, 01/16/2002  . FLEXIBLE SIGMOIDOSCOPY N/A 05/02/2017   Procedure: FLEXIBLE SIGMOIDOSCOPY;  Surgeon: Manya Silvas, MD;  Location: [facility] ENDOSCOPY;  Service: Endoscopy;  Laterality: N/A;  . HEMORRHOID SURGERY N/A 05/14/2017   Procedure: EXTERNAL THROMBOSED HEMORRHOIDECTOMY;  Surgeon: Robert Bellow, MD;  Location: [facility] ORS;  Service: General;  Laterality: N/A;  . HIP ARTHROPLASTY Right 02/17/2017   Procedure: ARTHROPLASTY BIPOLAR HIP (HEMIARTHROPLASTY);  Surgeon: Claud Kelp, MD;  Location: [facility] ORS;  Service: Orthopedics;  Laterality: Right;  . LEFT HEART CATH AND CORONARY ANGIOGRAPHY N/A 08/31/2016   Procedure: Left Heart Cath and Coronary Angiography;  Surgeon: Wellington Hampshire, MD;  Location: [place] CV LAB;  Service: Cardiovascular;  Laterality: N/A;  . ORIF PERIPROSTHETIC FRACTURE Right 05/27/2017  . ORIF PERIPROSTHETIC FRACTURE Right 05/27/2017   Procedure: OPEN REDUCTION INTERNAL FIXATION (ORIF) PERIPROSTHETIC FRACTURE;  Surgeon: Mcarthur Rossetti, MD;  Location: [place];  Service: Orthopedics;  Laterality: Right;  . TOTAL HIP ARTHROPLASTY Right   . uterian prolapse      Prior to Admission medications   Medication Sig Start Date End Date  Taking? Authorizing Provider  albuterol (VENTOLIN HFA) 108 (90 Base) MCG/ACT inhaler Inhale 2 puffs into the lungs every 6 (six) hours as needed for wheezing or shortness of breath. 02/13/19   Tyler Pita, MD  atorvastatin (LIPITOR) 40 MG tablet TAKE 1 TABLET BY MOUTH DAILY 03/16/20   [name], MD  carvedilol (COREG) 6.25 MG tablet TAKE ONE TABLET TWICE DAILY WITH A MEAL 05/28/20   Wellington Hampshire, MD  FLORA-Q [facility]) CAPS capsule Take 1 capsule by mouth daily.    [provider]  glipiZIDE (GLUCOTROL) 5 MG tablet TAKE 1 TABLET BY MOUTH TWICE DAILY BEFORE A MEAL 08/06/20   [name], MD  glucose blood (BAYER CONTOUR TEST) test strip Use as instructed 03/26/18   [name], MD  hydrALAZINE (APRESOLINE) 50 MG tablet 75 MG IN AM AND AFTERNOON AND 50 MG IN THE EVENING    [provider]  hydrochlorothiazide (MICROZIDE) 12.5 MG capsule TAKE 1 CAPSULE BY MOUTH EVERY DAY 08/12/20   [name], MD  isosorbide mononitrate (IMDUR) 60 MG 24 hr tablet TAKE 1 TABLET BY MOUTH DAILY 06/29/20   Wellington Hampshire, MD  lansoprazole (PREVACID) 15 MG capsule Take 15  mg by mouth daily as needed.    [provider]  lansoprazole (PREVACID) 30 MG capsule TAKE 1 CAPSULE EVERY DAY BEFORE SUPPER 12/22/19   Tyler Pita, MD  loratadine (CLARITIN) 10 MG tablet Take 10 mg daily by mouth.     [provider]  losartan (COZAAR) 100 MG tablet TAKE ONE TABLET BY MOUTH EVERY DAY 03/30/20   [name], MD  nitrofurantoin, macrocrystal-monohydrate, (MACROBID) 100 MG capsule Take 1 capsule (100 mg total) by mouth 2 (two) times daily. 06/18/20   [name], MD  nitroGLYCERIN (NITROSTAT) 0.4 MG SL tablet Place 1 tablet (0.4 mg total) under the tongue every 5 (five) minutes as needed for chest pain. 07/29/18   [name], MD  sertraline (ZOLOFT) 50 MG tablet TAKE ONE AND ONE-HALF TABLETS DAILY 08/06/20   [name], MD  traMADol (ULTRAM) 50 MG tablet Take  1-2 tablets (50-100 mg total) by mouth every 6 (six) hours as needed. 12/22/19   Mcarthur Rossetti, MD  Trospium Chloride 60 MG CP24 TAKE ONE CAPSULE EACH MORNING BEFORE BREAKFAST 01/06/20   [name], MD  vitamin C (ASCORBIC ACID) 500 MG tablet Take 1 tablet (500 mg total) by mouth 2 (two) times daily. 05/04/17   [name], MD    Allergies Asa [aspirin], Aspirin, Atropine, Belladonna alkaloids, Codeine, Darvon, Darvon [propoxyphene], Demerol [meperidine], Ferrous sulfate, Meperidine and related, Methocarbamol, Penicillins, Sulfa antibiotics, and Iron  Family History  Problem Relation Age of Onset  . Breast cancer Mother 75  . Colon cancer Father   . Stroke Sister   . Cancer Mother   . Breast cancer Sister 20  . Colon cancer Brother   . Colon cancer Brother     Social History Social History   Tobacco Use  . Smoking status: Never Smoker  . Smokeless tobacco: Never Used  Vaping Use  . Vaping Use: Never used  Substance Use Topics  . Alcohol use: No  . Drug use: No    Review of Systems  Constitutional: No fever/chills Eyes: No visual changes. ENT: No sore throat. Cardiovascular: Positive for chest pain. Respiratory: Denies shortness of breath. Gastrointestinal: No abdominal pain.  No nausea, no vomiting.  No diarrhea.  No constipation. Genitourinary: Negative for dysuria. Musculoskeletal: Negative for back pain. Skin: Negative for rash. Neurological: Negative for headaches, focal weakness or numbness.  ____________________________________________   PHYSICAL EXAM:  VITAL SIGNS: ED Triage Vitals  Enc Vitals Group     BP 08/18/20 1830 (!) 222/89     Pulse Rate 08/18/20 1830 83     Resp 08/18/20 1830 16     Temp 08/18/20 1830 98 F (36.7 C)     Temp Source 08/18/20 1830 Oral     SpO2 08/18/20 1830 96 %     Weight 08/18/20 1827 138 lb 14.2 oz (63 kg)     Height 08/18/20 1827 5\' 1"  (1.549 m)     Head Circumference --      Peak Flow --      Pain Score  08/18/20 1827 10     Pain Loc --      Pain Edu? --      Excl. in [place]? --     Constitutional: Alert and oriented. Eyes: Conjunctivae are normal. Head: Atraumatic. Nose: No congestion/rhinnorhea. Mouth/Throat: Mucous membranes are moist. Neck: Normal ROM Cardiovascular: Normal rate, regular rhythm.  Systolic murmur noted.  2+ radial pulses bilaterally. Respiratory: Normal respiratory effort.  No retractions.  Lungs CTAB.  No chest wall tenderness to palpation. Gastrointestinal: Soft and nontender. No distention. Genitourinary: deferred Musculoskeletal: No lower extremity tenderness nor edema. Neurologic:  Normal speech and language. No gross focal neurologic deficits are appreciated. Skin:  Skin is warm, dry and intact. No rash noted. Psychiatric: Mood and affect are normal. Speech and behavior are normal.  ____________________________________________   LABS (all labs ordered are listed, but only abnormal results are displayed)  Labs Reviewed  BASIC METABOLIC PANEL - Abnormal; Notable for the following components:      Result Value   Potassium 3.3 (*)    Glucose, Bld 152 (*)    BUN 32 (*)    Creatinine, Ser 1.20 (*)    GFR, Estimated 42 (*)    All other components within normal limits  CBC - Abnormal; Notable for the following components:   HCT 35.1 (*)    All other components within normal limits  TROPONIN I (HIGH SENSITIVITY) - Abnormal; Notable for the following components:   Troponin I (High Sensitivity) 1,663 (*)    All other components within normal limits  SARS CORONAVIRUS 2 (TAT 6-24 HRS)  APTT  PROTIME-INR  HEPARIN LEVEL (UNFRACTIONATED)  CBC   ____________________________________________  EKG  ED ECG REPORT I, Blake Divine, the attending physician, personally viewed and interpreted this ECG.   Date: 08/18/2020  EKG Time: 18:30  Rate: 83  Rhythm: normal sinus rhythm  Axis: Normal  Intervals:none  ST&T Change: None   PROCEDURES  Procedure(s)  performed (including Critical Care):  .Critical Care Performed by: Blake Divine, MD Authorized by: Blake Divine, MD   Critical care provider statement:    Critical care time (minutes):  45   Critical care time was exclusive of:  Separately billable procedures and treating other patients and teaching time   Critical care was necessary to treat or prevent imminent or life-threatening deterioration of the following conditions:  Cardiac failure   Critical care was time spent personally by me on the following activities:  Discussions with consultants, evaluation of patient's response to treatment, examination of patient, ordering and performing treatments and interventions, ordering and review of laboratory studies, ordering and review of radiographic studies, pulse oximetry, re-evaluation of patient's condition, obtaining history from patient or surrogate and review of old charts   I assumed direction of critical care for this patient from another provider in my specialty: no     Care discussed with: admitting provider       ____________________________________________   INITIAL IMPRESSION / ASSESSMENT AND PLAN / ED COURSE       87 year old female with past medical history of hypertension, hyperlipidemia, diabetes, CAD, CKD, aortic stenosis, diastolic CHF, and interstitial lung disease who presents to the ED with intermittent chest pressure along with elevated blood pressure since this morning.  Patient states that symptoms have now improved and she is currently asymptomatic.  EKG shows no evidence of arrhythmia or ischemia but patient's history and description of symptoms is concerning for ACS.  We will check troponin and treat elevated blood pressure with IV labetalol, she unfortunately has anaphylactic allergy to aspirin.  We will check chest x-ray, anticipate admission for further cardiac work-up given her high risk status.  Troponin noted to be elevated at 1600, on reassessment  patient reports mild pain in her chest.  Blood pressure had improved without intervention and so labetalol was held.  We will now give a dose of nitroglycerin and start patient on heparin.  Chest x-ray reviewed by me  and shows no infiltrate, edema, or effusion.  Case discussed with hospitalist for admission.      ____________________________________________   FINAL CLINICAL IMPRESSION(S) / ED DIAGNOSES  Final diagnoses:  NSTEMI (non-ST elevated myocardial infarction) ([place])  Nonspecific chest pain     ED Discharge Orders    None       Note:  This document was prepared using Dragon voice recognition software and may include unintentional dictation errors.   Blake Divine, MD 08/18/20 Lona Kettle    Blake Divine, MD 08/18/20 [phone]

## 2020-08-18 NOTE — Consult Note (Signed)
ANTICOAGULATION CONSULT NOTE - Initial Consult  Pharmacy Consult for heparin infusion Indication: chest pain/ACS  Allergies  Allergen Reactions  . Asa [Aspirin] Anaphylaxis  . Aspirin Swelling  . Atropine Other (See Comments)    Reaction: unknown  . Belladonna Alkaloids Other (See Comments)    Reaction: unknown  . Codeine Other (See Comments)    Reaction: unknown  . Darvon Other (See Comments)    Reaction: unknown  . Darvon [Propoxyphene] Nausea And Vomiting  . Demerol [Meperidine] Nausea And Vomiting  . Ferrous Sulfate Diarrhea  . Meperidine And Related   . Methocarbamol Other (See Comments)    Reaction: unknown  . Penicillins Other (See Comments)    Pt and family unsure of details  . Sulfa Antibiotics Other (See Comments)    Reaction: unknown  . Iron Other (See Comments)    High dose prescription gives her diarrhea    Patient Measurements: Height: 5\' 1"  (154.9 cm) Weight: 63 kg (138 lb 14.2 oz) IBW/kg (Calculated) : 47.8 Heparin Dosing Weight: 60.7 kg  Vital Signs: Temp: 98 F (36.7 C) (04/20 1830) Temp Source: Oral (04/20 1830) BP: 194/90 (04/20 1945) Pulse Rate: 79 (04/20 1945)  Labs: Recent Labs    08/18/20 1836  HGB 12.1  HCT 35.1*  PLT 171  CREATININE 1.20*  TROPONINIHS 1,663*    Estimated Creatinine Clearance: 23.9 mL/min (A) (by C-G formula based on SCr of 1.2 mg/dL (H)).   Medical History: Past Medical History:  Diagnosis Date  . Aortic stenosis    a. 08/2016 Echo: Hyperdynamic LV fxn, mild AS (mean grad 59mmHg), mild MR, mild PAH; b. 05/2017 Echo: Hyperdynamic LV fxn, mod AS (mean grad 97mmHg, valve area 1.84). PASP 152mmHg.  . Asthma without status asthmaticus    unspecified  . Closed right hip fracture ([place]) 05/26/2017  . Colitis    unspecified  . Complication of anesthesia    sensitive to anesthesia  . Depression   . Diabetes mellitus type 2, uncomplicated ([place])   . Essential hypertension   . GERD (gastroesophageal reflux disease)    . 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 Hyperlipidemia    unspecified  . Hypertension    a. 08/2017 Renal U/S: No RAS.  [name] Low back pain with left-sided sciatica 04/29/2018  . Myocardial infarction ([place]) 2018  . Non-obstructive CAD (coronary artery disease)    a. 08/2016 NSTEMI/Cath: mild to mod nonobs dzs including 60% mRCA stenosis-->Med rx.  . NSTEMI (non-ST elevated myocardial infarction) ([place]) 08/30/2016  . Nummular eczema 01/16/2011   Occurring on chest and under breast.  Treated by Dr Evorn Gong with fluocinonide 0.05% topical 06/26/12   . Peripheral vascular disease ([place])   . 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 Positive H. pylori test   . Suspected COVID-19 virus infection 01/10/2019   Patient reporting malaise and headache for the past 3 days,  Similar to presentation when she was positive for  COVID 19 INFECTION  July 2 .  Previous inection was caused by exposure to COVID positive caregiver.  Repeat scenario:  Same caregiver's mother tested positive on Monday and caregiver saw current patient on Tuesday    . Viral gastroenteritis due to [place]-like agent Nov 2012    Medications:  No prior anticoagulation noted  Assessment: 87 y.o. female with PMH of hypertension, CAD who presents to the ED complaining of chest pain. Troponin noted to be elevated at 1600. Pharmacy has been consulted for  initiation and management of heparin infusion for ACS.  Heparin Dosing Weight: 60.7 kg  Goal of Therapy:  Heparin level 0.3-0.7 units/ml Monitor platelets by anticoagulation protocol: Yes   Plan:  Give 3600 units bolus x 1 Start heparin infusion at 750 units/hr Check anti-Xa level in 8 hours and daily while on heparin Continue to monitor H&H and platelets  Dorothe Pea, PharmD, BCPS Clinical Pharmacist  08/18/2020,7:54 PM

## 2020-08-19 ENCOUNTER — Other Ambulatory Visit: Payer: Self-pay

## 2020-08-19 ENCOUNTER — Observation Stay (HOSPITAL_COMMUNITY)
Admit: 2020-08-19 | Discharge: 2020-08-19 | Disposition: A | Discharge status: Home / Self Care | Payer: Medicare Other | Attending: Internal Medicine | Admitting: Internal Medicine

## 2020-08-19 DIAGNOSIS — I252 Old myocardial infarction: Secondary | ICD-10-CM | POA: Diagnosis not present

## 2020-08-19 DIAGNOSIS — N1831 Chronic kidney disease, stage 3a: Secondary | ICD-10-CM | POA: Diagnosis not present

## 2020-08-19 DIAGNOSIS — I2510 Atherosclerotic heart disease of native coronary artery without angina pectoris: Secondary | ICD-10-CM | POA: Diagnosis present

## 2020-08-19 DIAGNOSIS — D509 Iron deficiency anemia, unspecified: Secondary | ICD-10-CM | POA: Diagnosis present

## 2020-08-19 DIAGNOSIS — I130 Hypertensive heart and chronic kidney disease with heart failure and stage 1 through stage 4 chronic kidney disease, or unspecified chronic kidney disease: Secondary | ICD-10-CM | POA: Diagnosis present

## 2020-08-19 DIAGNOSIS — I214 Non-ST elevation (NSTEMI) myocardial infarction: Principal | ICD-10-CM

## 2020-08-19 DIAGNOSIS — I10 Essential (primary) hypertension: Secondary | ICD-10-CM | POA: Diagnosis not present

## 2020-08-19 DIAGNOSIS — H9192 Unspecified hearing loss, left ear: Secondary | ICD-10-CM | POA: Diagnosis present

## 2020-08-19 DIAGNOSIS — Z515 Encounter for palliative care: Secondary | ICD-10-CM | POA: Diagnosis not present

## 2020-08-19 DIAGNOSIS — I5032 Chronic diastolic (congestive) heart failure: Secondary | ICD-10-CM | POA: Diagnosis not present

## 2020-08-19 DIAGNOSIS — D500 Iron deficiency anemia secondary to blood loss (chronic): Secondary | ICD-10-CM

## 2020-08-19 DIAGNOSIS — Z20822 Contact with and (suspected) exposure to covid-19: Secondary | ICD-10-CM | POA: Diagnosis present

## 2020-08-19 DIAGNOSIS — E785 Hyperlipidemia, unspecified: Secondary | ICD-10-CM | POA: Diagnosis present

## 2020-08-19 DIAGNOSIS — Z66 Do not resuscitate: Secondary | ICD-10-CM | POA: Diagnosis present

## 2020-08-19 DIAGNOSIS — Z8616 Personal history of COVID-19: Secondary | ICD-10-CM | POA: Diagnosis not present

## 2020-08-19 DIAGNOSIS — N179 Acute kidney failure, unspecified: Secondary | ICD-10-CM

## 2020-08-19 DIAGNOSIS — I160 Hypertensive urgency: Secondary | ICD-10-CM | POA: Diagnosis not present

## 2020-08-19 DIAGNOSIS — E1121 Type 2 diabetes mellitus with diabetic nephropathy: Secondary | ICD-10-CM | POA: Diagnosis not present

## 2020-08-19 DIAGNOSIS — Z886 Allergy status to analgesic agent status: Secondary | ICD-10-CM | POA: Diagnosis not present

## 2020-08-19 DIAGNOSIS — R296 Repeated falls: Secondary | ICD-10-CM | POA: Diagnosis present

## 2020-08-19 DIAGNOSIS — I350 Nonrheumatic aortic (valve) stenosis: Secondary | ICD-10-CM | POA: Diagnosis present

## 2020-08-19 DIAGNOSIS — F32A Depression, unspecified: Secondary | ICD-10-CM | POA: Diagnosis present

## 2020-08-19 DIAGNOSIS — E1151 Type 2 diabetes mellitus with diabetic peripheral angiopathy without gangrene: Secondary | ICD-10-CM | POA: Diagnosis present

## 2020-08-19 DIAGNOSIS — Z888 Allergy status to other drugs, medicaments and biological substances status: Secondary | ICD-10-CM | POA: Diagnosis not present

## 2020-08-19 DIAGNOSIS — J45909 Unspecified asthma, uncomplicated: Secondary | ICD-10-CM | POA: Diagnosis present

## 2020-08-19 DIAGNOSIS — R079 Chest pain, unspecified: Secondary | ICD-10-CM | POA: Diagnosis not present

## 2020-08-19 DIAGNOSIS — Z881 Allergy status to other antibiotic agents status: Secondary | ICD-10-CM | POA: Diagnosis not present

## 2020-08-19 DIAGNOSIS — K219 Gastro-esophageal reflux disease without esophagitis: Secondary | ICD-10-CM | POA: Diagnosis present

## 2020-08-19 DIAGNOSIS — F411 Generalized anxiety disorder: Secondary | ICD-10-CM | POA: Diagnosis not present

## 2020-08-19 DIAGNOSIS — J849 Interstitial pulmonary disease, unspecified: Secondary | ICD-10-CM | POA: Diagnosis present

## 2020-08-19 DIAGNOSIS — Z7189 Other specified counseling: Secondary | ICD-10-CM | POA: Diagnosis not present

## 2020-08-19 DIAGNOSIS — R0989 Other specified symptoms and signs involving the circulatory and respiratory systems: Secondary | ICD-10-CM | POA: Diagnosis not present

## 2020-08-19 LAB — SARS CORONAVIRUS 2 (TAT 6-24 HRS): SARS Coronavirus 2: NEGATIVE

## 2020-08-19 LAB — ECHOCARDIOGRAM COMPLETE
AR max vel: 1.51 cm2
AV Area VTI: 1.7 cm2
AV Area mean vel: 1.53 cm2
AV Mean grad: 16.3 mmHg
AV Peak grad: 27 mmHg
Ao pk vel: 2.6 m/s
Area-P 1/2: 2.08 cm2
Height: 61 in
S' Lateral: 2.33 cm
Weight: 2292.78 oz

## 2020-08-19 LAB — CBC
HCT: 34 % — ABNORMAL LOW (ref 36.0–46.0)
Hemoglobin: 11.5 g/dL — ABNORMAL LOW (ref 12.0–15.0)
MCH: 28.4 pg (ref 26.0–34.0)
MCHC: 33.8 g/dL (ref 30.0–36.0)
MCV: 84 fL (ref 80.0–100.0)
Platelets: 153 10*3/uL (ref 150–400)
RBC: 4.05 MIL/uL (ref 3.87–5.11)
RDW: 13.3 % (ref 11.5–15.5)
WBC: 8.9 10*3/uL (ref 4.0–10.5)
nRBC: 0 % (ref 0.0–0.2)

## 2020-08-19 LAB — HEPARIN LEVEL (UNFRACTIONATED)
Heparin Unfractionated: 0.38 IU/mL (ref 0.30–0.70)
Heparin Unfractionated: 0.32 IU/mL (ref 0.30–0.70)

## 2020-08-19 LAB — BASIC METABOLIC PANEL
Anion gap: 8 (ref 5–15)
BUN: 27 mg/dL — ABNORMAL HIGH (ref 8–23)
CO2: 23 mmol/L (ref 22–32)
Calcium: 9 mg/dL (ref 8.9–10.3)
Chloride: 105 mmol/L (ref 98–111)
Creatinine, Ser: 1.09 mg/dL — ABNORMAL HIGH (ref 0.44–1.00)
GFR, Estimated: 47 mL/min — ABNORMAL LOW (ref >60)
Glucose, Bld: 139 mg/dL — ABNORMAL HIGH (ref 70–99)
Potassium: 4.3 mmol/L (ref 3.5–5.1)
Sodium: 136 mmol/L (ref 135–145)

## 2020-08-19 MED ORDER — CARVEDILOL 12.5 MG PO TABS
12.5000 mg | ORAL_TABLET | Freq: Two times a day (BID) | ORAL | Status: DC
  Administered 2020-08-19 – 2020-08-20 (×2): 12.5 mg via ORAL
  Filled 2020-08-19 (×2): qty 1

## 2020-08-19 MED ORDER — CLOPIDOGREL BISULFATE 75 MG PO TABS
75.0000 mg | ORAL_TABLET | Freq: Every day | ORAL | Status: DC
  Administered 2020-08-19 – 2020-08-20 (×2): 75 mg via ORAL
  Filled 2020-08-19 (×2): qty 1

## 2020-08-19 MED ORDER — POTASSIUM CHLORIDE 10 MEQ/100ML IV SOLN
10.0000 meq | INTRAVENOUS | Status: AC
  Administered 2020-08-19 (×4): 10 meq via INTRAVENOUS
  Filled 2020-08-19 (×4): qty 100

## 2020-08-19 MED ORDER — HEPARIN (PORCINE) 25000 UT/250ML-% IV SOLN
750.0000 [IU]/h | INTRAVENOUS | Status: DC
  Administered 2020-08-19: 750 [IU]/h via INTRAVENOUS
  Filled 2020-08-19: qty 250

## 2020-08-19 MED ORDER — HEPARIN BOLUS VIA INFUSION: 3600.0000 [IU] | Freq: Once | INTRAVENOUS | Status: DC

## 2020-08-19 NOTE — Consult Note (Addendum)
ANTICOAGULATION CONSULT NOTE - Initial Consult  Pharmacy Consult for heparin infusion Indication: chest pain/ACS  Allergies  Allergen Reactions  . Asa [Aspirin] Anaphylaxis  . Aspirin Swelling  . Atropine Other (See Comments)    Reaction: unknown  . Belladonna Alkaloids Other (See Comments)    Reaction: unknown  . Codeine Other (See Comments)    Reaction: unknown  . Darvon Other (See Comments)    Reaction: unknown  . Darvon [Propoxyphene] Nausea And Vomiting  . Demerol [Meperidine] Nausea And Vomiting  . Ferrous Sulfate Diarrhea  . Meperidine And Related   . Methocarbamol Other (See Comments)    Reaction: unknown  . Penicillins Other (See Comments)    Pt and family unsure of details  . Sulfa Antibiotics Other (See Comments)    Reaction: unknown  . Iron Other (See Comments)    High dose prescription gives her diarrhea    Patient Measurements: Height: 5\' 1"  (154.9 cm) Weight: 65 kg (143 lb 4.8 oz) IBW/kg (Calculated) : 47.8 Heparin Dosing Weight: 60.7 kg  Vital Signs: Temp: 97.9 F (36.6 C) (04/21 0500) Temp Source: Oral (04/21 0500) BP: 171/80 (04/21 0500) Pulse Rate: 71 (04/21 0500)  Labs: Recent Labs    08/18/20 1836 08/18/20 1954 08/18/20 2004 08/19/20 0437  HGB 12.1  --   --  11.5*  HCT 35.1*  --   --  34.0*  PLT 171  --   --  153  APTT  --  33  --   --   LABPROT  --  14.5  --   --   INR  --  1.1  --   --   HEPARINUNFRC  --   --   --  0.38  CREATININE 1.20*  --   --  1.09*  TROPONINIHS 1,663*  --  1,624*  --     Estimated Creatinine Clearance: 26.7 mL/min (A) (by C-G formula based on SCr of 1.09 mg/dL (H)).   Medical History: Past Medical History:  Diagnosis Date  . Aortic stenosis    a. 08/2016 Echo: Hyperdynamic LV fxn, mild AS (mean grad 23mmHg), mild MR, mild PAH; b. 05/2017 Echo: Hyperdynamic LV fxn, mod AS (mean grad 63mmHg, valve area 1.84). PASP 174mmHg.  . Asthma without status asthmaticus    unspecified  . Closed right hip fracture  ([place]) 05/26/2017  . Colitis    unspecified  . Complication of anesthesia    sensitive to anesthesia  . Depression   . Diabetes mellitus type 2, uncomplicated ([place])   . Essential hypertension   . GERD (gastroesophageal reflux disease)   . 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 Hyperlipidemia    unspecified  . Hypertension    a. 08/2017 Renal U/S: No RAS.  [name] Low back pain with left-sided sciatica 04/29/2018  . Myocardial infarction ([facility]) 2018  . Non-obstructive CAD (coronary artery disease)    a. 08/2016 NSTEMI/Cath: mild to mod nonobs dzs including 60% mRCA stenosis-->Med rx.  . NSTEMI (non-ST elevated myocardial infarction) ([place]) 08/30/2016  . Nummular eczema 01/16/2011   Occurring on chest and under breast.  Treated by Dr Evorn Gong with fluocinonide 0.05% topical 06/26/12   . Peripheral vascular disease ([place])   . 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 Positive H. pylori test   . Pre-diabetes   . Suspected COVID-19 virus infection 01/10/2019   Patient reporting malaise and headache for the past 3 days,  Similar to presentation when  she was positive for  COVID 19 INFECTION  July 2 .  Previous inection was caused by exposure to COVID positive caregiver.  Repeat scenario:  Same caregiver's mother tested positive on Monday and caregiver saw current patient on Tuesday    . Viral gastroenteritis due to [place]-like agent Nov 2012    Medications:  No prior anticoagulation noted  Assessment: 87 y.o. female with PMH of hypertension, CAD who presents to the ED complaining of chest pain. Troponin noted to be elevated at 1600. Pharmacy has been consulted for initiation and management of heparin infusion for ACS.  Heparin Dosing Weight: 60.7 kg  Goal of Therapy:  Heparin level 0.3-0.7 units/ml Monitor platelets by anticoagulation protocol: Yes   4/21 0437 HL 0.38, therapeutic x 1   Plan:  Continue heparin infusion at 750 units/hr Recheck HL  in 8 hrs to confirm then daily while on heparin CBC daily.  Renda Rolls, PharmD, [facility] 08/19/2020 5:45 AM

## 2020-08-19 NOTE — Consult Note (Signed)
Cardiology Consultation:   Patient ID: Maureen Morales MRN: [id number]; DOB: 1922-07-08  Admit date: 08/18/2020 Date of Consult: 08/19/2020  PCP:  [name], MD   [facility]  Cardiologist:  [name], MD  Physician requesting consult: Dr. Manuella Ghazi, V Reason for consult: Non-STEMI  Patient Profile:   Maureen Morales is a 87 y.o. female with a hx of hypertension, aortic valve stenosis, coronary artery disease, diabetes type 2, Non-STEMI May 2018 in the setting of uncontrolled hypertension, presenting with chest pain, elevated troponin  History of Present Illness:   Ms. [name] reports that she was in her usual state of health, developing chest discomfort yesterday.  She really wanted to go to [employer] and go shopping, and although she had chest pain she did not tell her caretaker. She did take nitro x3, hydralazine provided by her daughter, symptoms did not improve Presenting to the emergency room She describes discomfort as a heaviness, no radiation to jaw or arm In the emergency room blood pressure 222/89 EKG unrevealing Initial troponin 1600 Notes indicating she was asymptomatic in the emergency room, chest tightness had improved  Overnight no significant discomfort This morning no complaints  Echocardiogram reviewed in detail, moderate concentric LVH, normal ejection fraction, no focal wall motion abnormality    Past Medical History:  Diagnosis Date  . Aortic stenosis    a. 08/2016 Echo: Hyperdynamic LV fxn, mild AS (mean grad 64mmHg), mild MR, mild PAH; b. 05/2017 Echo: Hyperdynamic LV fxn, mod AS (mean grad 81mmHg, valve area 1.84). PASP 147mmHg.  . Asthma without status asthmaticus    unspecified  . Closed right hip fracture ([place]) 05/26/2017  . Colitis    unspecified  . Complication of anesthesia    sensitive to anesthesia  . Depression   . Diabetes mellitus type 2, uncomplicated ([place])   . Essential hypertension   . GERD  (gastroesophageal reflux disease)   . 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 Hyperlipidemia    unspecified  . Hypertension    a. 08/2017 Renal U/S: No RAS.  [name] Low back pain with left-sided sciatica 04/29/2018  . Myocardial infarction ([place]) 2018  . Non-obstructive CAD (coronary artery disease)    a. 08/2016 NSTEMI/Cath: mild to mod nonobs dzs including 60% mRCA stenosis-->Med rx.  . NSTEMI (non-ST elevated myocardial infarction) ([place]) 08/30/2016  . Nummular eczema 01/16/2011   Occurring on chest and under breast.  Treated by Dr Evorn Gong with fluocinonide 0.05% topical 06/26/12   . Peripheral vascular disease ([place])   . 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 Positive H. pylori test   . Pre-diabetes   . Suspected COVID-19 virus infection 01/10/2019   Patient reporting malaise and headache for the past 3 days,  Similar to presentation when she was positive for  COVID 19 INFECTION  July 2 .  Previous inection was caused by exposure to COVID positive caregiver.  Repeat scenario:  Same caregiver's mother tested positive on Monday and caregiver saw current patient on Tuesday    . Viral gastroenteritis due to [place]-like agent Nov 2012    Past Surgical History:  Procedure Laterality Date  . ABDOMINAL HYSTERECTOMY    . BLADDER SURGERY    . CARDIAC CATHETERIZATION    . CHOLECYSTECTOMY    . COLONOSCOPY     06/16/1987, 02/20/1995, 05/04/1999, 02/14/2005, 05/17/2007  . ESOPHAGOGASTRODUODENOSCOPY     05/11/1987, 06/13/1995, 10/09/1995, 05/04/1999, 01/16/2002  . FLEXIBLE  SIGMOIDOSCOPY N/A 05/02/2017   Procedure: FLEXIBLE SIGMOIDOSCOPY;  Surgeon: Manya Silvas, MD;  Location: [facility] ENDOSCOPY;  Service: Endoscopy;  Laterality: N/A;  . HEMORRHOID SURGERY N/A 05/14/2017   Procedure: EXTERNAL THROMBOSED HEMORRHOIDECTOMY;  Surgeon: Robert Bellow, MD;  Location: [facility] ORS;  Service: General;  Laterality: N/A;  . HIP ARTHROPLASTY Right 02/17/2017    Procedure: ARTHROPLASTY BIPOLAR HIP (HEMIARTHROPLASTY);  Surgeon: Claud Kelp, MD;  Location: [facility] ORS;  Service: Orthopedics;  Laterality: Right;  . LEFT HEART CATH AND CORONARY ANGIOGRAPHY N/A 08/31/2016   Procedure: Left Heart Cath and Coronary Angiography;  Surgeon: Wellington Hampshire, MD;  Location: [place] CV LAB;  Service: Cardiovascular;  Laterality: N/A;  . ORIF PERIPROSTHETIC FRACTURE Right 05/27/2017  . ORIF PERIPROSTHETIC FRACTURE Right 05/27/2017   Procedure: OPEN REDUCTION INTERNAL FIXATION (ORIF) PERIPROSTHETIC FRACTURE;  Surgeon: Mcarthur Rossetti, MD;  Location: [place];  Service: Orthopedics;  Laterality: Right;  . TOTAL HIP ARTHROPLASTY Right   . uterian prolapse       Home Medications:  Prior to Admission medications   Medication Sig Start Date End Date Taking? Authorizing Provider  acetaminophen (TYLENOL) 500 MG tablet Take 1,000 mg by mouth every 6 (six) hours as needed for moderate pain or headache.   Yes [provider]  albuterol (VENTOLIN HFA) 108 (90 Base) MCG/ACT inhaler Inhale 2 puffs into the lungs every 6 (six) hours as needed for wheezing or shortness of breath. 02/13/19  Yes Tyler Pita, MD  atorvastatin (LIPITOR) 40 MG tablet TAKE 1 TABLET BY MOUTH DAILY Patient taking differently: Take 40 mg by mouth daily with supper. 03/16/20  Yes [name], MD  Carboxymethylcellul-Glycerin (LUBRICATING EYE DROPS OP) Place 1 drop into both eyes daily as needed (dry eyes).   Yes [provider]  carvedilol (COREG) 6.25 MG tablet TAKE ONE TABLET TWICE DAILY WITH A MEAL Patient taking differently: Take 6.25 mg by mouth 2 (two) times daily with a meal. 05/28/20  Yes Wellington Hampshire, MD  famotidine (PEPCID) 10 MG tablet Take 10 mg by mouth daily as needed for heartburn or indigestion.   Yes [provider]  glipiZIDE (GLUCOTROL) 5 MG tablet TAKE 1 TABLET BY MOUTH TWICE DAILY BEFORE A MEAL Patient taking differently: Take 5 mg by  mouth 2 (two) times daily before a meal. 08/06/20  Yes [name], MD  hydrALAZINE (APRESOLINE) 50 MG tablet Take 50-75 mg by mouth See admin instructions. Take 75 mg with breakfast, 75 mg with lunch, and 50 mg with dinner   Yes [provider]  hydrochlorothiazide (MICROZIDE) 12.5 MG capsule TAKE 1 CAPSULE BY MOUTH EVERY DAY Patient taking differently: Take 12.5 mg by mouth daily. 08/12/20  Yes [name], MD  isosorbide mononitrate (IMDUR) 60 MG 24 hr tablet TAKE 1 TABLET BY MOUTH DAILY Patient taking differently: Take 60 mg by mouth daily with supper. 06/29/20  Yes Wellington Hampshire, MD  Lactobacillus (ULTIMATE PROBIOTIC FORMULA) CAPS Take 1 capsule by mouth daily with supper.   Yes [provider]  lansoprazole (PREVACID) 30 MG capsule TAKE 1 CAPSULE EVERY DAY BEFORE SUPPER Patient taking differently: Take 30 mg by mouth daily as needed (acid reflux). 12/22/19  Yes Tyler Pita, MD  loratadine (CLARITIN) 10 MG tablet Take 10 mg daily by mouth.    Yes [provider]  losartan (COZAAR) 100 MG tablet TAKE ONE TABLET BY MOUTH EVERY DAY Patient taking differently: Take 100 mg by mouth daily with lunch.  03/30/20  Yes [name], MD  nitroGLYCERIN (NITROSTAT) 0.4 MG SL tablet Place 1 tablet (0.4 mg total) under the tongue every 5 (five) minutes as needed for chest pain. 07/29/18  Yes [name], MD  sertraline (ZOLOFT) 50 MG tablet TAKE ONE AND ONE-HALF TABLETS DAILY Patient taking differently: Take 75 mg by mouth daily. 08/06/20  Yes [name], MD  Trospium Chloride 60 MG CP24 TAKE ONE CAPSULE EACH MORNING BEFORE BREAKFAST Patient taking differently: Take 60 mg by mouth daily with lunch. 01/06/20  Yes [name], MD  vitamin C (ASCORBIC ACID) 500 MG tablet Take 1 tablet (500 mg total) by mouth 2 (two) times daily. 05/04/17  Yes [name], MD  glucose blood (BAYER CONTOUR TEST) test strip Use as instructed 03/26/18   [name], MD     Inpatient Medications: Scheduled Meds: . vitamin C  500 mg Oral BID  . atorvastatin  40 mg Oral Daily  . carvedilol  6.25 mg Oral BID WC  . hydrALAZINE  75 mg Oral 2 times per day   And  . hydrALAZINE  50 mg Oral QPM  . isosorbide mononitrate  60 mg Oral Daily  . loratadine  10 mg Oral Daily  . pantoprazole  40 mg Oral QAC supper  . sertraline  75 mg Oral Daily   Continuous Infusions: . heparin Stopped (08/19/20 1313)   PRN Meds: acetaminophen **OR** acetaminophen, albuterol, ondansetron **OR** ondansetron (ZOFRAN) IV, traMADol  Allergies:    Allergies  Allergen Reactions  . Asa [Aspirin] Anaphylaxis and Swelling  . Clindamycin     dizziness  . Oxytetracycline Hives  . Phenobarbital Hives  . Atropine Other (See Comments)    Reaction: unknown  . Belladonna Alkaloids Other (See Comments)    Reaction: unknown  . Codeine Nausea And Vomiting  . Darvon [Propoxyphene] Nausea And Vomiting  . Demerol [Meperidine] Nausea And Vomiting  . Methocarbamol Other (See Comments)    Reaction: unknown  . Penicillins Other (See Comments)    Pt and family unsure of details  . Sulfa Antibiotics Other (See Comments)    Reaction: unknown  . Iron Other (See Comments)    High dose prescription gives her diarrhea    Social History:   Social History   Socioeconomic History  . Marital status: Widowed    Spouse name: Not on file  . Number of children: 4  . Years of education: 37  . Highest education level: High school graduate  Occupational History  . Occupation: retired  Tobacco Use  . Smoking status: Never Smoker  . Smokeless tobacco: Never Used  Vaping Use  . Vaping Use: Never used  Substance and Sexual Activity  . Alcohol use: No  . Drug use: No  . Sexual activity: Never  Other Topics Concern  . Not on file  Social History Narrative   ** Merged History Encounter **       Married 4 children Never smoker Denies alcohol use Full Code   Social Determinants of Adult nurse Strain: Not on file  Food Insecurity: Not on file  Transportation Needs: Not on file  Physical Activity: Not on file  Stress: Not on file  Social Connections: Not on file  Intimate Partner Violence: Not on file    Family History:    Family History  Problem Relation Age of Onset  . Breast cancer Mother 27  . Colon cancer Father   . Stroke Sister   .  Cancer Mother   . Breast cancer Sister 47  . Colon cancer Brother   . Colon cancer Brother      ROS:  Please see the history of present illness.  Review of Systems  Constitutional: Negative.   HENT: Negative.   Respiratory: Negative.   Cardiovascular: Positive for chest pain.  Gastrointestinal: Negative.   Musculoskeletal: Negative.   Neurological: Negative.   Psychiatric/Behavioral: Negative.   All other systems reviewed and are negative.   Physical Exam/Data:   Vitals:   08/18/20 2100 08/18/20 2245 08/19/20 0500 08/19/20 0802  BP: (!) 156/76 (!) 178/73 (!) 171/80 (!) 162/82  Pulse: 75  71 71  Resp: 20 18 17 16   Temp:  97.6 F (36.4 C) 97.9 F (36.6 C) 98.4 F (36.9 C)  TempSrc:  Oral Oral Oral  SpO2: 97% 99% 99% 97%  Weight:  65 kg    Height:  5\' 1"  (1.549 m)      Intake/Output Summary (Last 24 hours) at 08/19/2020 1402 Last data filed at 08/19/2020 1346 Gross per 24 hour  Intake 725.79 ml  Output --  Net 725.79 ml   Last 3 Weights 08/18/2020 08/18/2020 06/03/2020  Weight (lbs) 143 lb 4.8 oz 138 lb 14.2 oz 138 lb  Weight (kg) 65 kg 63 kg 62.596 kg  Some encounter information is confidential and restricted. Go to Review Flowsheets activity to see all data.     Body mass index is 27.08 kg/m.  General:  Well nourished, well developed, in no acute distress HEENT: normal Lymph: no adenopathy Neck: no JVD Endocrine:  No thryomegaly Vascular: No carotid bruits; FA pulses 2+ bilaterally without bruits  Cardiac:  normal S1, S2; RRR; no murmur  Lungs:  clear to auscultation bilaterally, no  wheezing, rhonchi or rales  Abd: soft, nontender, no hepatomegaly  Ext: no edema Musculoskeletal:  No deformities, BUE and BLE strength normal and equal Skin: warm and dry  Neuro:  CNs 2-12 intact, no focal abnormalities noted Psych:  Normal affect   EKG:  The EKG was personally reviewed and demonstrates:   Normal sinus rhythm rate 83 bpm nonspecific ST abnormality  Telemetry:  Telemetry was personally reviewed and demonstrates:   Normal sinus rhythm  Relevant CV Studies: Echocardiogram with normal LV function, estimated 55% or higher  moderate LVH  Laboratory Data:  High Sensitivity Troponin:   Recent Labs  Lab 08/18/20 1836 08/18/20 2004  TROPONINIHS 1,663* 1,624*     Chemistry Recent Labs  Lab 08/18/20 1836 08/19/20 0437  NA 136 136  K 3.3* 4.3  CL 103 105  CO2 23 23  GLUCOSE 152* 139*  BUN 32* 27*  CREATININE 1.20* 1.09*  CALCIUM 9.5 9.0  GFRNONAA 42* 47*  ANIONGAP 10 8    No results for input(s): PROT, ALBUMIN, AST, ALT, ALKPHOS, BILITOT in the last 168 hours. Hematology Recent Labs  Lab 08/18/20 1836 08/19/20 0437  WBC 9.1 8.9  RBC 4.25 4.05  HGB 12.1 11.5*  HCT 35.1* 34.0*  MCV 82.6 84.0  MCH 28.5 28.4  MCHC 34.5 33.8  RDW 13.3 13.3  PLT 171 153   BNPNo results for input(s): BNP, PROBNP in the last 168 hours.  DDimer No results for input(s): DDIMER in the last 168 hours.   Radiology/Studies:  DG Chest 2 View  Result Date: 08/18/2020 CLINICAL DATA:  Substernal chest pain for 1 day EXAM: CHEST - 2 VIEW COMPARISON:  07/16/2019 FINDINGS: Cardiac shadow is at the upper limits of normal in size  but accentuated by the frontal technique. Aortic calcifications are seen. The lungs are well aerated bilaterally. Minimal basilar atelectasis is seen without focal confluent infiltrate. No sizable effusion is noted. Degenerative changes of the thoracic spine are noted. IMPRESSION: Mild bibasilar atelectasis. Electronically Signed   By: Inez Catalina M.D.   On:  08/18/2020 19:41     Assessment and Plan:   1. Non-STEMI In the setting of hypertensive urgency, Systolic pressure 341 on arrival -Most recent blood pressure 133/59 At home was taking carvedilol 6.25 twice daily, hydralazine 75 mg a.m., noon and 50 mg in the p.m. Taking HCTZ 12.5 daily, isosorbide 60 mg in the p.m. Also on losartan 100 daily -Case discussed with Dr. Fletcher Anon who knows her well, She has had prior episodes of labile hypertension associated with non-STEMI, with cardiac catheterization back in 2018 Medical management recommended at that time --- He has recommended carvedilol up to 12.5 mg twice daily Continue other medications -Heparin for additional 24 hours -She is on Lipitor, -Note indicating allergy to aspirin, anaphylaxis -We will start Plavix in place of aspirin  2.  Hypertensive urgency Initially did not respond to several sublingual nitroglycerin Placed back on regular regiment, pressures have improved Plan is to increase carvedilol up to 12.5 daily, continue other medications as detailed above  3.  Hyperlipidemia Goal LDL less than 70, currently on Lipitor 40  Long discussion with family and patient All questions answered Plan discussed with hospitalist service  Total encounter time more than 110 minutes  Greater than 50% was spent in counseling and coordination of care with the patient   For questions or updates, please contact [facility] Please consult [email or web address] for contact info under    Signed, Ida Rogue, MD  08/19/2020 2:02 PM

## 2020-08-19 NOTE — Progress Notes (Signed)
1        [facility] [facility] [place] NAME: Maureen Morales    MR#:  [id number]  DATE OF BIRTH:  1922-09-26  SUBJECTIVE:  CHIEF COMPLAINT:   Chief Complaint  Patient presents with  . Chest Pain  Patient denies any chest pain.  Seen with Sonia Side at bedside performing echo.  Patient is hard of hearing.  Caregiver at bedside reports having issue with controlling blood pressure for a long time REVIEW OF SYSTEMS:  Review of Systems  Constitutional: Negative for diaphoresis, fever, malaise/fatigue and weight loss.  HENT: Negative for ear discharge, ear pain, hearing loss, nosebleeds, sore throat and tinnitus.   Eyes: Negative for blurred vision and pain.  Respiratory: Negative for cough, hemoptysis, shortness of breath and wheezing.   Cardiovascular: Positive for chest pain. Negative for palpitations, orthopnea and leg swelling.  Gastrointestinal: Negative for abdominal pain, blood in stool, constipation, diarrhea, heartburn, nausea and vomiting.  Genitourinary: Negative for dysuria, frequency and urgency.  Musculoskeletal: Negative for back pain and myalgias.  Skin: Negative for itching and rash.  Neurological: Negative for dizziness, tingling, tremors, focal weakness, seizures, weakness and headaches.  Psychiatric/Behavioral: Negative for depression. The patient is not nervous/anxious.    DRUG ALLERGIES:   Allergies  Allergen Reactions  . Asa [Aspirin] Anaphylaxis and Swelling  . Clindamycin     dizziness  . Oxytetracycline Hives  . Phenobarbital Hives  . Atropine Other (See Comments)    Reaction: unknown  . Belladonna Alkaloids Other (See Comments)    Reaction: unknown  . Codeine Nausea And Vomiting  . Darvon [Propoxyphene] Nausea And Vomiting  . Demerol [Meperidine] Nausea And Vomiting  . Methocarbamol Other (See Comments)    Reaction: unknown  . Penicillins Other (See Comments)    Pt and family unsure of details  . Sulfa Antibiotics Other (See Comments)     Reaction: unknown  . Iron Other (See Comments)    High dose prescription gives her diarrhea   VITALS:  Blood pressure (!) 162/82, pulse 71, temperature 98.4 F (36.9 C), temperature source Oral, resp. rate 16, height 5\' 1"  (1.549 m), weight 65 kg, SpO2 97 %. PHYSICAL EXAMINATION:  Physical Exam  87 year old female lying in the bed comfortably without any acute distress Eyes pupils equal round reactive light accommodation, no scleral icterus ENT: Difficulty hearing Lungs clear to auscultation bilaterally no wheezing rales rhonchi crepitation Cardiovascular S1-S2 normal.  No murmur rubs or gallop Abdomen soft nontender nondistended bowel sounds present. Neuro patient is alert and oriented, nonfocal exam LABORATORY PANEL:  Female CBC Recent Labs  Lab 08/19/20 0437  WBC 8.9  HGB 11.5*  HCT 34.0*  PLT 153   ------------------------------------------------------------------------------------------------------------------ Chemistries  Recent Labs  Lab 08/18/20 1836 08/19/20 0437  NA 136 136  K 3.3* 4.3  CL 103 105  CO2 23 23  GLUCOSE 152* 139*  BUN 32* 27*  CREATININE 1.20* 1.09*  CALCIUM 9.5 9.0  MG 1.6*  --    RADIOLOGY:  DG Chest 2 View  Result Date: 08/18/2020 CLINICAL DATA:  Substernal chest pain for 1 day EXAM: CHEST - 2 VIEW COMPARISON:  07/16/2019 FINDINGS: Cardiac shadow is at the upper limits of normal in size but accentuated by the frontal technique. Aortic calcifications are seen. The lungs are well aerated bilaterally. Minimal basilar atelectasis is seen without focal confluent infiltrate. No sizable effusion is noted. Degenerative changes of the thoracic spine are noted. IMPRESSION: Mild bibasilar atelectasis. Electronically Signed   By: Elta Guadeloupe  Lukens M.D.   On: 08/18/2020 19:41   ASSESSMENT AND PLAN:  87 y.o. female with medical history significant for coronary artery disease, hypertension, hyperlipidemia, history of NSTEMI in May 2018, non-insulin-dependent  diabetes mellitus, aortic stenosis, history of right hip fracture in 2836, grade 2 diastolic dysfunction, history of asthma, history of nonobstructive coronary artery disease, history of H. pylori positive admitted for chief concerns of chest pressure  Non-STEMI Due to demand ischemia from uncontrolled hypertension.  Troponin peaked at [facility] recommends conservative management with heparin for 48 hours.  Can be stopped tomorrow Echo pending - No aspirin was given as aspirin causes patient to swell, anaphylactic shock -  I will discontinue morphine as she is not having much pain at all.  Continue home dose of tramadol 50- 100 mg every 12 hours -Cardiology not planning for any ischemic evaluation  # Heart failure preserved ejection fraction-resumed carvedilol 6.25 mg p.o. twice daily, isosorbide mononitrate 60 mg daily -Grade 2 diastolic dysfunction, echo on 11/04/2019 -Pending repeat echo and cardiology consult.  Discussed with Dr. Rockey Situ  # Hypertension- continue hydralazine 75 mg, carvedilol 6.25 mg twice daily, isosorbide mononitrate 60 mg daily and adjust as needed for better blood pressure control  # Hyperlipidemia-atorvastatin 40 mg p.o. daily   # Depression-sertraline 75 mg p.o. daily   # GERD-pantoprazole 40 mg daily   Palliative care consult for goals of care conversation  At baseline patient lives at home with 24/7 caregivers.  Her daughter lives right across her home and checks on her.  Patient walks with a walker and uses wheelchair.  She does eat well according to caregiver and has a sharp mental state  Body mass index is 27.08 kg/m.  Net IO Since Admission: 605.79 mL [08/19/20 1258]      Status is: Inpatient  Remains inpatient appropriate because:Unsafe d/c plan   Dispo: The patient is from: Home              Anticipated d/c is to: Home              Patient currently is not medically stable to d/c.   Difficult to place patient No   Will request  progressive mobility while in the hospital    DVT prophylaxis:       Place TED hose Start: 08/18/20 2006 On heparin drip    Family Communication: Updated caregiver at bedside on 4/21   All the records are reviewed and case discussed with Care Management/Social Worker. Management plans discussed with the patient, family and they are in agreement.  CODE STATUS: DNR Level of care: Progressive Cardiac  TOTAL TIME TAKING CARE OF THIS PATIENT: 35 minutes.   More than 50% of the time was spent in counseling/coordination of care: YES  POSSIBLE D/C IN 1-2 DAYS, DEPENDING ON CLINICAL CONDITION.   Max Sane M.D on 08/19/2020 at 12:58 PM  [facility]   CC: Primary care physician; [name], MD  Note: This dictation was prepared with Dragon dictation along with smaller phrase technology. Any transcriptional errors that result from this process are unintentional.

## 2020-08-19 NOTE — Progress Notes (Signed)
*  PRELIMINARY RESULTS* Echocardiogram 2D Echocardiogram has been performed.  Sherrie Sport 08/19/2020, 9:52 AM

## 2020-08-19 NOTE — Progress Notes (Signed)
Mobility Specialist - Progress Note   08/19/20 1200  Mobility  Activity Transferred:  Bed to chair;Transferred:  Chair to bed  Level of Assistance Minimal assist, patient does 75% or more  Assistive Device Front wheel walker  Distance Ambulated (ft) 6 ft  Mobility Response Tolerated well  Mobility performed by Mobility specialist  $Mobility charge 1 Mobility    Pt voiced soiling herself upon arrival. Able to roll onto R side for peri-natal care, pt continuing BM and was placed on bedpan. Pt was able to exit R side of bed with minA to stand. No AD in use for session. Family at bedside offered to assist with care and transfer. Pt ambulated to chair d/t soiled sheets. VC for sequencing, carried over commands well. Mobility provided clean linen. Pt returned to bed with steps repeated. No LOB. Supervision to return supine. RA.    Kathee Delton Mobility Specialist 08/19/20, 12:56 PM

## 2020-08-19 NOTE — Evaluation (Signed)
Physical Therapy Evaluation Patient Details Name: Maureen Morales MRN: [id number] DOB: Mar 07, 1923 Today's Date: 08/19/2020   History of Present Illness  Maureen Morales is a 87 y.o. female with medical history significant for coronary artery disease, hypertension, hyperlipidemia, history of NSTEMI in May 2018, non-insulin-dependent diabetes mellitus, aortic stenosis, history of right hip fracture in 9179, grade 2 diastolic dysfunction, history of asthma, history of nonobstructive coronary artery disease, history of H. pylori positive, presents to the emergency department from home via private vehicle for chief concerns of chest pressure. Diagnosed here with NSTEMI.    Clinical Impression  Patient received in bed, she is pleasant and agreeable to PT assessment. Patient requires min guard for bed mobility ( supine to sit) and min assist for sit to stand. She ambulated a total of 40 feet in room with RW and min guard. Patient reports no difficulties with ambulation, and states she is feeling good throughout session. Patient will continue to benefit from skilled PT while here to improve functional mobility and independence.      Follow Up Recommendations Home health PT    Equipment Recommendations  None recommended by PT    Recommendations for Other Services       Precautions / Restrictions Precautions Precautions: Fall Restrictions Weight Bearing Restrictions: No      Mobility  Bed Mobility Overal bed mobility: Needs Assistance Bed Mobility: Supine to Sit     Supine to sit: Min assist;HOB elevated     General bed mobility comments: min assist, increased time    Transfers Overall transfer level: Needs assistance Equipment used: None Transfers: Sit to/from Stand Sit to Stand: Min assist            Ambulation/Gait Ambulation/Gait assistance: Min guard Gait Distance (Feet): 40 Feet Assistive device: Rolling walker (2 wheeled) Gait Pattern/deviations: Step-through  pattern;Decreased step length - right;Decreased step length - left;Shuffle Gait velocity: WFL   General Gait Details: Patient ambulated to door of room x 2 laps. Min guard.  Stairs            Wheelchair Mobility    Modified Rankin (Stroke Patients Only)       Balance Overall balance assessment: Needs assistance Sitting-balance support: Feet supported Sitting balance-Leahy Scale: Good     Standing balance support: Bilateral upper extremity supported;During functional activity Standing balance-Leahy Scale: Fair Standing balance comment: reliant on RW and min guard for safety                             Pertinent Vitals/Pain Pain Assessment: No/denies pain    Home Living Family/patient expects to be discharged to:: Private residence Living Arrangements: Children Available Help at Discharge: Family;Available 24 hours/day Type of Home: House Home Access: Ramped entrance     Home Layout: Two level;Able to live on main level with bedroom/bathroom Home Equipment: Gilford Rile - 2 wheels;Wheelchair - manual      Prior Function Level of Independence: Needs assistance   Gait / Transfers Assistance Needed: uses walker for limited ambulation with assistance. Otherwise stays in in wheelchair a lot  ADL's / Homemaking Assistance Needed: Has 24 hour supervision/care at home        Hand Dominance   Dominant Hand: Right    Extremity/Trunk Assessment   Upper Extremity Assessment Upper Extremity Assessment: Defer to OT evaluation    Lower Extremity Assessment Lower Extremity Assessment: Generalized weakness    Cervical / Trunk Assessment Cervical / Trunk Assessment:  Kyphotic  Communication   Communication: HOH  Cognition Arousal/Alertness: Awake/alert Behavior During Therapy: WFL for tasks assessed/performed Overall Cognitive Status: Within Functional Limits for tasks assessed                                        General Comments       Exercises     Assessment/Plan    PT Assessment Patient needs continued PT services  PT Problem List Decreased strength;Decreased mobility;Decreased activity tolerance;Decreased balance       PT Treatment Interventions Gait training;Therapeutic exercise;Balance training;Functional mobility training;Therapeutic activities;Patient/family education    PT Goals (Current goals can be found in the Care Plan section)  Acute Rehab PT Goals Patient Stated Goal: to return home PT Goal Formulation: With patient/family Time For Goal Achievement: 09/02/20 Potential to Achieve Goals: Good    Frequency Min 2X/week   Barriers to discharge        Co-evaluation               AM-PAC PT "6 Clicks" Mobility  Outcome Measure Help needed turning from your back to your side while in a flat bed without using bedrails?: A Little Help needed moving from lying on your back to sitting on the side of a flat bed without using bedrails?: A Little Help needed moving to and from a bed to a chair (including a wheelchair)?: A Little Help needed standing up from a chair using your arms (e.g., wheelchair or bedside chair)?: A Little Help needed to walk in hospital room?: A Little Help needed climbing 3-5 steps with a railing? : A Lot 6 Click Score: 17    End of Session Equipment Utilized During Treatment: Gait belt Activity Tolerance: Patient tolerated treatment well Patient left: in bed;Other (comment);with family/visitor present (left sitting on side of the bed, OT arrived as I left.) Nurse Communication: Mobility status PT Visit Diagnosis: Muscle weakness (generalized) (M62.81);Unsteadiness on feet (R26.81);History of falling (Z91.81)    Time: 1415-1430 PT Time Calculation (min) (ACUTE ONLY): 15 min   Charges:   PT Evaluation $PT Eval Moderate Complexity: 1 Mod          Aarish Rockers, PT, GCS 08/19/20,2:43 PM

## 2020-08-19 NOTE — Consult Note (Signed)
[facility] for heparin infusion Indication: chest pain/ACS  Allergies  Allergen Reactions  . Asa [Aspirin] Anaphylaxis and Swelling  . Clindamycin     dizziness  . Oxytetracycline Hives  . Phenobarbital Hives  . Atropine Other (See Comments)    Reaction: unknown  . Belladonna Alkaloids Other (See Comments)    Reaction: unknown  . Codeine Nausea And Vomiting  . Darvon [Propoxyphene] Nausea And Vomiting  . Demerol [Meperidine] Nausea And Vomiting  . Methocarbamol Other (See Comments)    Reaction: unknown  . Penicillins Other (See Comments)    Pt and family unsure of details  . Sulfa Antibiotics Other (See Comments)    Reaction: unknown  . Iron Other (See Comments)    High dose prescription gives her diarrhea    Patient Measurements: Height: 5\' 1"  (154.9 cm) Weight: 65 kg (143 lb 4.8 oz) IBW/kg (Calculated) : 47.8 Heparin Dosing Weight: 60.7 kg  Vital Signs: Temp: 98.4 F (36.9 C) (04/21 0802) Temp Source: Oral (04/21 0802) BP: 162/82 (04/21 0802) Pulse Rate: 71 (04/21 0802)  Labs: Recent Labs    08/18/20 1836 08/18/20 1954 08/18/20 2004 08/19/20 0437 08/19/20 1232  HGB 12.1  --   --  11.5*  --   HCT 35.1*  --   --  34.0*  --   PLT 171  --   --  153  --   APTT  --  33  --   --   --   LABPROT  --  14.5  --   --   --   INR  --  1.1  --   --   --   HEPARINUNFRC  --   --   --  0.38 0.32  CREATININE 1.20*  --   --  1.09*  --   TROPONINIHS 1,663*  --  1,624*  --   --     Estimated Creatinine Clearance: 26.7 mL/min (A) (by C-G formula based on SCr of 1.09 mg/dL (H)).   Medical History: Past Medical History:  Diagnosis Date  . Aortic stenosis    a. 08/2016 Echo: Hyperdynamic LV fxn, mild AS (mean grad 35mmHg), mild MR, mild PAH; b. 05/2017 Echo: Hyperdynamic LV fxn, mod AS (mean grad 67mmHg, valve area 1.84). PASP 175mmHg.  . Asthma without status asthmaticus    unspecified  . Closed right hip fracture ([place]) 05/26/2017  .  Colitis    unspecified  . Complication of anesthesia    sensitive to anesthesia  . Depression   . Diabetes mellitus type 2, uncomplicated ([place])   . Essential hypertension   . GERD (gastroesophageal reflux disease)   . 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 Hyperlipidemia    unspecified  . Hypertension    a. 08/2017 Renal U/S: No RAS.  [name] Low back pain with left-sided sciatica 04/29/2018  . Myocardial infarction ([place]) 2018  . Non-obstructive CAD (coronary artery disease)    a. 08/2016 NSTEMI/Cath: mild to mod nonobs dzs including 60% mRCA stenosis-->Med rx.  . NSTEMI (non-ST elevated myocardial infarction) ([place]) 08/30/2016  . Nummular eczema 01/16/2011   Occurring on chest and under breast.  Treated by Dr Evorn Gong with fluocinonide 0.05% topical 06/26/12   . Peripheral vascular disease ([place])   . 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 Positive H. pylori test   . Pre-diabetes   . Suspected COVID-19 virus infection 01/10/2019   Patient reporting malaise  and headache for the past 3 days,  Similar to presentation when she was positive for  COVID 19 INFECTION  July 2 .  Previous inection was caused by exposure to COVID positive caregiver.  Repeat scenario:  Same caregiver's mother tested positive on Monday and caregiver saw current patient on Tuesday    . Viral gastroenteritis due to [place]-like agent Nov 2012    Medications:  No prior anticoagulation noted  Assessment: 87 y.o. female with PMH of hypertension, CAD who presents to the ED complaining of chest pain. Troponin noted to be elevated at 1600. Pharmacy has been consulted for initiation and management of heparin infusion for ACS.  Heparin Dosing Weight: 60.7 kg  Goal of Therapy:  Heparin level 0.3-0.7 units/ml Monitor platelets by anticoagulation protocol: Yes   4/21 0437 HL 0.38, therapeutic x 1 4/21 1232 HL 0.32, therapeutic x 2   Plan:  Heparin level is therapeutic. Continue  heparin infusion at 750 units/hr. Recheck HL and CBC with AM labs. Plan to continue heparin for 24 hours.   Eleonore Chiquito, PharmD, BCPS 08/19/2020 1:54 PM

## 2020-08-19 NOTE — Evaluation (Signed)
Occupational Therapy Evaluation Patient Details Name: Maureen Morales MRN: [id number] DOB: Jul 15, 1922 Today's Date: 08/19/2020    History of Present Illness Maureen Morales is a 87 y.o. female with medical history significant for coronary artery disease, hypertension, hyperlipidemia, history of NSTEMI in May 2018, non-insulin-dependent diabetes mellitus, aortic stenosis, history of right hip fracture in 3295, grade 2 diastolic dysfunction, history of asthma, history of nonobstructive coronary artery disease, history of H. pylori positive, presents to the emergency department from home via private vehicle for chief concerns of chest pressure. Diagnosed here with NSTEMI.   Clinical Impression   Pt seen for OT evaluation on this date. Upon arrival to room, pt sitting EOB finishing session with PT. Pt reporting no pain and agreeable to OT eval/tx. Of note, pt is HOH and PLOF confirmed by caregiver sitting at bedside. Prior to admission, pt was using a RW for functional mobility of short household distances and a WC for longer distances, and was receiving MIN A for ADLs. Pt has 24/7 supervision from caregivers at home. This date, pt presents with decreased balance and activity tolerance, requiring SUPERVISION for seated grooming tasks at EOB, MIN GUARD for functional mobility of short household distances with RW, MIN GUARD for toilet transfers and peri-care, and SUPERVISION for standing hand hygiene. Pt would benefit from continued skilled OT services to maximize return to PLOF and minimize risk of future falls, injury, caregiver burden, and readmission. Upon discharge, recommend HHOT.     Follow Up Recommendations  Home health OT;Supervision/Assistance - 24 hour    Equipment Recommendations  None recommended by OT       Precautions / Restrictions Precautions Precautions: Fall Restrictions Weight Bearing Restrictions: No      Mobility Bed Mobility Overal bed mobility: Needs Assistance Bed  Mobility: Sit to Supine     Supine to sit: Min assist;HOB elevated Sit to supine: Min guard   General bed mobility comments: Requires increased time and multi-modal cues for body positioning to scoot towards HOB    Transfers Overall transfer level: Needs assistance Equipment used: Rolling walker (2 wheeled) Transfers: Sit to/from Stand Sit to Stand: Min guard         General transfer comment: verbal cues for safe hand placement with RW    Balance Overall balance assessment: Needs assistance Sitting-balance support: No upper extremity supported;Feet supported Sitting balance-Leahy Scale: Good Sitting balance - Comments: Good sitting balance at EOB while brushing hair   Standing balance support: During functional activity;Single extremity supported Standing balance-Leahy Scale: Fair Standing balance comment: Requires unilateral UE from RW during sit>stand peri-care                           ADL either performed or assessed with clinical judgement   ADL Overall ADL's : Needs assistance/impaired     Grooming: Wash/dry hands;Standing;Brushing hair;Sitting;Supervision/safety;Set up                   Toilet Transfer: Min guard;Ambulation;Regular Toilet;Grab bars;RW Armed forces technical officer Details (indicate cue type and reason): Verbal cues for safe hand placement. Toileting- Water quality scientist and Hygiene: Min guard;Sit to/from stand Toileting - Clothing Manipulation Details (indicate cue type and reason): Pt able to don/doff underwear and perform peri-care     Functional mobility during ADLs: Min guard;Rolling walker       Vision Baseline Vision/History: Wears glasses Wears Glasses: At all times  Pertinent Vitals/Pain Pain Assessment: No/denies pain     Hand Dominance Right   Extremity/Trunk Assessment Upper Extremity Assessment Upper Extremity Assessment: Generalized weakness   Lower Extremity Assessment Lower Extremity  Assessment: Generalized weakness   Cervical / Trunk Assessment Cervical / Trunk Assessment: Kyphotic   Communication Communication Communication: HOH   Cognition Arousal/Alertness: Awake/alert Behavior During Therapy: WFL for tasks assessed/performed Overall Cognitive Status: Within Functional Limits for tasks assessed                                                Home Living Family/patient expects to be discharged to:: Private residence Living Arrangements: Children Available Help at Discharge: Family;Available 24 hours/day Type of Home: House Home Access: Ramped entrance     Home Layout: Two level;Able to live on main level with bedroom/bathroom     Bathroom Shower/Tub: Walk-in shower   Bathroom Toilet: Handicapped height     Home Equipment: Environmental consultant - 2 wheels;Wheelchair - manual          Prior Functioning/Environment Level of Independence: Needs assistance  Gait / Transfers Assistance Needed: RW for should household distances, otherwise uses WC ADL's / Homemaking Assistance Needed: Pt has 24 hour supervision/care at home. At baseline, pt requires MIN A for ADLs            OT Problem List: Decreased strength;Decreased activity tolerance      OT Treatment/Interventions: Self-care/ADL training;Therapeutic exercise;Energy conservation;DME and/or AE instruction;Therapeutic activities;Patient/family education;Balance training    OT Goals(Current goals can be found in the care plan section) Acute Rehab OT Goals Patient Stated Goal: to return home OT Goal Formulation: With patient Time For Goal Achievement: 09/02/20 Potential to Achieve Goals: Good ADL Goals Pt Will Perform Upper Body Dressing: with modified independence;sitting Pt Will Perform Lower Body Dressing: with min guard assist;sit to/from stand Pt Will Transfer to Toilet: with modified independence;ambulating;regular height toilet;grab bars  OT Frequency: Min 1X/week    AM-PAC OT  "6 Clicks" Daily Activity     Outcome Measure Help from another person eating meals?: None Help from another person taking care of personal grooming?: A Little Help from another person toileting, which includes using toliet, bedpan, or urinal?: A Little Help from another person bathing (including washing, rinsing, drying)?: A Little Help from another person to put on and taking off regular upper body clothing?: A Little Help from another person to put on and taking off regular lower body clothing?: A Little 6 Click Score: 19   End of Session Equipment Utilized During Treatment: Gait belt;Rolling walker Nurse Communication: Mobility status  Activity Tolerance: Patient tolerated treatment well Patient left: in bed;with call bell/phone within reach;with bed alarm set;with family/visitor present  OT Visit Diagnosis: Muscle weakness (generalized) (M62.81)                Time: 3536-1443 OT Time Calculation (min): 22 min Charges:  OT General Charges $OT Visit: 1 Visit OT Evaluation $OT Eval Moderate Complexity: 1 Mod OT Treatments $Self Care/Home Management : 8-22 mins  Maureen Morales, OTR/L [place]

## 2020-08-19 NOTE — Consult Note (Signed)
Consultation Note Date: 08/19/2020   Patient Name: Maureen Morales  DOB: 1922/08/03  MRN: [id number]  Age / Sex: 87 y.o., female   PCP: [name], MD Referring Physician: Max Sane, MD   REASON FOR CONSULTATION:Establishing goals of care  Palliative Care consult requested for goals of care discussion in this 87 y.o. female with a medical history significant for hypertension, hyperlipidemia, coronary artery disease, NSTEMI (08/2016), diabetes type 2, right hip fracture (4332), diastolic CHF, and asthma.  Patient presented to the ER from home via private vehicle with concerns of chest pain.  Work-up showed NSTEMI.  Patient followed by cardiology and started on IV heparin and nitroglycerin.  Clinical Assessment and Goals of Care: I have reviewed medical records including lab results, imaging, Epic notes, and MAR, received report from the bedside RN, and assessed the patient.   I met at the bedside with Ms. Lorenz and Margreta Journey her hired caregiver to discuss diagnosis prognosis, [place], EOL wishes, disposition and options. I spoke with patient's daughter, [name] via phone. Patient is awake, alert and oriented x3. Denies pain and states she is feeling much better.   I introduced Palliative Medicine as specialized medical care for people living with serious illness. It focuses on providing relief from the symptoms and stress of a serious illness. The goal is to improve quality of life for both the patient and the family.   We discussed a brief life review of the patient, along with her functional and nutritional status.  Ms. Wecker shares she is widowed.  Her husband passed away in [id number] at 39 years old.  He has 4 children.  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  Patient lives in the home with hired 24/7  caregivers.  Her caregiver is at the bedside currently.  Her daughter [name] lives on the same street and is very active in her health and care.  Prior to admission patient was mainly wheelchair-bound however will utilize a walker for transfers and assistance.  She had a good appetite and often eats snacks throughout the day.  Would go out shopping with her caregivers and goes to get her hair done every other week.  We discussed Her current illness and what it means in the larger context of Her on-going co-morbidities. Natural disease trajectory and expectations at EOL were discussed.  Ms. Donaway and her daughter verbalized understanding of her current illness and comorbidities.  She remains hopeful she will be able to return home with hired caregivers.  A detailed discussion was had today regarding advanced directives.  Concepts specific to code status, artifical feeding and hydration, continued IV antibiotics and rehospitalization.   Patient and daughter confirms she does have a documented advanced directive.  Her daughter Loretto Belinsky is her medical decision-maker.  Patient confirms wishes for DNR/DNI.  He is clear in her wishes that she would not want any heroic measures including artificial feeding and hydration.  She states "when my time comes I am ready forgot to take me and I do not want to do anything to interfere with that".  The difference between a aggressive medical intervention and a palliative comfort care path were discussed at length.   Values and goals of care important to patient and family were attempted to be elicited.   Patient and  her daughter goals are clear to continue to treat the treatable without escalation of care.  Patient would not want any extensive surgeries or work-up.  If her health was to further decline she would then wish to focus on her comfort.  She states her goals are to return home with hired caregivers and enjoy life as she was previously and so she can no longer  do so.  If  her health further declines she would then wish to focus on her comfort for what time she has left.  Quality of life is most important to her.   I discussed the importance of continued conversation with family and their medical providers regarding overall plan of care and treatment options, ensuring decisions are within the context of the patients values and GOCs.  Hospice and Palliative Care services outpatient were explained and offered. Patient and family verbalized their understanding and awareness of both palliative and hospice's goals and philosophy of care.  Daughter states they are familiar with hospice services and understand patient may need them in the near future however at this time they would like to discharge home with outpatient palliative support.  Daughter is aware she may transition care to hospice at any time by communicating with outpatient team.  Questions and concerns were addressed. The family was encouraged to call with questions or concerns.  PMT will continue to support holistically as needed.   CODE STATUS: DNR  ADVANCE DIRECTIVES: Primary Decision Maker:  Jenetta Downer (daughter/HCPOA)    SYMPTOM MANAGEMENT: per attending   Palliative Prophylaxis:   Aspiration, Bowel Regimen, Delirium Protocol, Eye Care, Frequent Pain Assessment, Oral Care and Turn Reposition  PSYCHO-SOCIAL/SPIRITUAL:  Support System: Family  Desire for further Chaplaincy support: NO   Additional Recommendations (Limitations, Scope, Preferences):  No Artificial Feeding, No Surgical Procedures, No Tracheostomy and DNR/DNI, treat the treatable  Education on hospice/palliative    PAST MEDICAL HISTORY: Past Medical History:  Diagnosis Date  . Aortic stenosis    a. 08/2016 Echo: Hyperdynamic LV fxn, mild AS (mean grad 35mHg), mild MR, mild PAH; b. 05/2017 Echo: Hyperdynamic LV fxn, mod AS (mean grad 226mg, valve area 1.84). PASP 10023m.  . Asthma without status asthmaticus     unspecified  . Closed right hip fracture ([facility]/26/2019  . Colitis    unspecified  . Complication of anesthesia    sensitive to anesthesia  . Depression   . Diabetes mellitus type 2, uncomplicated ([facility] . Essential hypertension   . GERD (gastroesophageal reflux disease)   . 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 Hyperlipidemia    unspecified  . Hypertension    a. 08/2017 Renal U/S: No RAS.  . LMarland Kitchenw back pain with left-sided sciatica 04/29/2018  . Myocardial infarction ([facility]  . Non-obstructive CAD (coronary artery disease)    a. 08/2016 NSTEMI/Cath: mild to mod nonobs dzs including 60% mRCA stenosis-->Med rx.  . NSTEMI (non-ST elevated myocardial infarction) ([facility]/06/2016  . Nummular eczema 01/16/2011   Occurring on chest and under breast.  Treated by Dr DasEvorn Gongth fluocinonide 0.05% topical 06/26/12   . Peripheral vascular disease ([facility] . 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 Positive H. pylori test   . Pre-diabetes   . Suspected COVID-19 virus infection 01/10/2019   Patient reporting malaise and headache for the past 3 days,  Similar to presentation when she was positive for  COVID 19 INFECTION  July  2 .  Previous inection was caused by exposure to COVID positive caregiver.  Repeat scenario:  Same caregiver's mother tested positive on Monday and caregiver saw current patient on Tuesday    . Viral gastroenteritis due to [place]-like agent Nov 2012    ALLERGIES:  is allergic to asa [aspirin], clindamycin, oxytetracycline, phenobarbital, atropine, belladonna alkaloids, codeine, darvon [propoxyphene], demerol [meperidine], methocarbamol, penicillins, sulfa antibiotics, and iron.   MEDICATIONS:  Current Facility-Administered Medications  Medication Dose Route Frequency Provider Last Rate Last Admin  . acetaminophen (TYLENOL) tablet 650 mg  650 mg Oral Q6H PRN Cox, Amy N, DO   650 mg at 08/18/20 2312   Or  . acetaminophen  (TYLENOL) suppository 650 mg  650 mg Rectal Q6H PRN Cox, Amy N, DO      . albuterol (VENTOLIN HFA) 108 (90 Base) MCG/ACT inhaler 2 puff  2 puff Inhalation Q6H PRN Cox, Amy N, DO      . ascorbic acid (VITAMIN C) tablet 500 mg  500 mg Oral BID Cox, Amy N, DO   500 mg at 08/19/20 0809  . atorvastatin (LIPITOR) tablet 40 mg  40 mg Oral Daily Cox, Amy N, DO   40 mg at 08/19/20 0809  . carvedilol (COREG) tablet 12.5 mg  12.5 mg Oral BID WC Gollan, Kathlene November, MD      . clopidogrel (PLAVIX) tablet 75 mg  75 mg Oral Daily Gollan, Kathlene November, MD      . heparin ADULT infusion 100 units/mL (25000 units/274m)  750 Units/hr Intravenous Continuous SMax Sane MD   Held at 08/19/20 1313  . hydrALAZINE (APRESOLINE) tablet 75 mg  75 mg Oral 2 times per day BRenda Rolls RPH   75 mg at 08/19/20 1310   And  . hydrALAZINE (APRESOLINE) tablet 50 mg  50 mg Oral QPM Belue, NAlver Sorrow RPH      . isosorbide mononitrate (IMDUR) 24 hr tablet 60 mg  60 mg Oral Daily Cox, Amy N, DO   60 mg at 08/19/20 0808  . loratadine (CLARITIN) tablet 10 mg  10 mg Oral Daily Cox, Amy N, DO   10 mg at 08/19/20 0809  . ondansetron (ZOFRAN) tablet 4 mg  4 mg Oral Q6H PRN Cox, Amy N, DO       Or  . ondansetron (ZOFRAN) injection 4 mg  4 mg Intravenous Q6H PRN Cox, Amy N, DO      . pantoprazole (PROTONIX) EC tablet 40 mg  40 mg Oral QAC supper Cox, Amy N, DO      . sertraline (ZOLOFT) tablet 75 mg  75 mg Oral Daily Cox, Amy N, DO   75 mg at 08/19/20 0807  . traMADol (ULTRAM) tablet 50-100 mg  50-100 mg Oral Q12H PRN Cox, Amy N, DO        VITAL SIGNS: BP (!) 133/59 (BP Location: Left Arm)   Pulse 73   Temp 97.7 F (36.5 C) (Oral)   Resp 20   Ht '5\' 1"'  (1.549 m)   Wt 65 kg   SpO2 98%   BMI 27.08 kg/m  Filed Weights   08/18/20 1827 08/18/20 2245  Weight: 63 kg 65 kg    Estimated body mass index is 27.08 kg/m as calculated from the following:   Height as of this encounter: '5\' 1"'  (1.549 m).   Weight as of this encounter: 65  kg.  LABS: CBC:    Component Value Date/Time   WBC 8.9 08/19/2020 0437  HGB 11.5 (L) 08/19/2020 0437   HCT 34.0 (L) 08/19/2020 0437   PLT 153 08/19/2020 0437   Comprehensive Metabolic Panel:    Component Value Date/Time   NA 136 08/19/2020 0437   NA 137 08/30/2016 0000   K 4.3 08/19/2020 0437   BUN 27 (H) 08/19/2020 0437   BUN 31 (A) 08/30/2016 0000   CREATININE 1.09 (H) 08/19/2020 0437   CREATININE 1.16 (H) 04/17/2017 1431   ALBUMIN 4.1 09/15/2019 1022     Review of Systems Unless otherwise noted, a complete review of systems is negative.  Physical Exam General: NAD, chronically-ill appearing Cardiovascular: regular rate and rhythm Pulmonary: diminished bilaterally  Abdomen: soft, nontender, + bowel sounds Extremities: no edema, no joint deformities Skin: no rashes, warm and dry Neurological: AAOx3, mood appropriate   Prognosis: Guarded- Poor  Discharge Planning:  Home with Palliative Services  Recommendations: . DNR/DNI-as confirmed by patient and her daughter . Continue with current plan of care, treat the treatable without escalation of care.  No artificial feeding/PEG no surgical interventions . Patient and family remains hopeful for stability however is realistic in their expectations.  They are clear with expressed goals of patient returning home with 24/7 caregivers and enjoying life.  As patient's health continues to decline they would then begin to focus more on her comfort at that point. . Outpatient palliative for ongoing support and discussions. [name] PMT will continue to support and follow as needed. Please call team line with urgent needs.   Palliative Performance Scale: PPS 30%               Patient and daughter expressed understanding and was in agreement with this plan.   Thank you for allowing the Palliative Medicine Team to assist in the care of this patient. Please utilize secure chat with additional questions, if there is no response within  30 minutes please call the above phone number.   Time In: 1315 Time Out: 1420 Time Total: 65 min.   Visit consisted of counseling and education dealing with the complex and emotionally intense issues of symptom management and palliative care in the setting of serious and potentially life-threatening illness.Greater than 50%  of this time was spent counseling and coordinating care related to the above assessment and plan.  Signed by:  Alda Lea, AGPCNP-BC Palliative Medicine Team  Phone: [phone] Pager: [phone] Amion: [place] Team providers are available by phone from 7am to 7pm daily and can be reached through the team cell phone.  Should this patient require assistance outside of these hours, please call the patient's attending physician.

## 2020-08-20 ENCOUNTER — Telehealth: Payer: Self-pay

## 2020-08-20 DIAGNOSIS — F411 Generalized anxiety disorder: Secondary | ICD-10-CM

## 2020-08-20 DIAGNOSIS — R079 Chest pain, unspecified: Secondary | ICD-10-CM

## 2020-08-20 DIAGNOSIS — R0989 Other specified symptoms and signs involving the circulatory and respiratory systems: Secondary | ICD-10-CM

## 2020-08-20 LAB — BASIC METABOLIC PANEL
Anion gap: 10 (ref 5–15)
BUN: 35 mg/dL — ABNORMAL HIGH (ref 8–23)
CO2: 22 mmol/L (ref 22–32)
Calcium: 9.1 mg/dL (ref 8.9–10.3)
Chloride: 106 mmol/L (ref 98–111)
Creatinine, Ser: 1.17 mg/dL — ABNORMAL HIGH (ref 0.44–1.00)
GFR, Estimated: 43 mL/min — ABNORMAL LOW (ref >60)
Glucose, Bld: 228 mg/dL — ABNORMAL HIGH (ref 70–99)
Potassium: 3.7 mmol/L (ref 3.5–5.1)
Sodium: 138 mmol/L (ref 135–145)

## 2020-08-20 LAB — HEPARIN LEVEL (UNFRACTIONATED): Heparin Unfractionated: 0.26 IU/mL — ABNORMAL LOW (ref 0.30–0.70)

## 2020-08-20 LAB — CBC
HCT: 34 % — ABNORMAL LOW (ref 36.0–46.0)
Hemoglobin: 11.7 g/dL — ABNORMAL LOW (ref 12.0–15.0)
MCH: 29 pg (ref 26.0–34.0)
MCHC: 34.4 g/dL (ref 30.0–36.0)
MCV: 84.4 fL (ref 80.0–100.0)
Platelets: 164 10*3/uL (ref 150–400)
RBC: 4.03 MIL/uL (ref 3.87–5.11)
RDW: 13.6 % (ref 11.5–15.5)
WBC: 8.9 10*3/uL (ref 4.0–10.5)
nRBC: 0 % (ref 0.0–0.2)

## 2020-08-20 LAB — MAGNESIUM: Magnesium: 2.2 mg/dL (ref 1.7–2.4)

## 2020-08-20 MED ORDER — CARVEDILOL 12.5 MG PO TABS
12.5000 mg | ORAL_TABLET | Freq: Two times a day (BID) | ORAL | 0 refills | Status: DC
Start: 2020-08-20 — End: 2020-08-28

## 2020-08-20 MED ORDER — HEPARIN (PORCINE) 25000 UT/250ML-% IV SOLN: 850.0000 [IU]/h | INTRAVENOUS | Status: DC

## 2020-08-20 MED ORDER — CARVEDILOL 12.5 MG PO TABS: 12.5000 mg | ORAL_TABLET | Freq: Two times a day (BID) | ORAL | Status: DC

## 2020-08-20 MED ORDER — CLOPIDOGREL BISULFATE 75 MG PO TABS: 75.0000 mg | ORAL_TABLET | Freq: Every day | ORAL | 0 refills | Status: DC

## 2020-08-20 MED ORDER — HEPARIN BOLUS VIA INFUSION
900.0000 [IU] | Freq: Once | INTRAVENOUS | Status: AC
  Administered 2020-08-20: 900 [IU] via INTRAVENOUS
  Filled 2020-08-20: qty 900

## 2020-08-20 MED ORDER — CARVEDILOL 25 MG PO TABS: 25.0000 mg | ORAL_TABLET | Freq: Two times a day (BID) | ORAL | Status: DC

## 2020-08-20 MED ORDER — ISOSORBIDE MONONITRATE ER 60 MG PO TB24: 60.0000 mg | ORAL_TABLET | Freq: Two times a day (BID) | ORAL | Status: DC

## 2020-08-20 MED ORDER — ISOSORBIDE MONONITRATE ER 30 MG PO TB24: ORAL_TABLET | ORAL | 0 refills | Status: DC

## 2020-08-20 MED ORDER — CARVEDILOL 12.5 MG PO TABS: 12.5000 mg | ORAL_TABLET | Freq: Two times a day (BID) | ORAL | 0 refills | Status: DC

## 2020-08-20 NOTE — Discharge Instructions (Signed)
Heart Attack A heart attack occurs when blood and oxygen supply to the heart is cut off. A heart attack causes damage to the heart that cannot be fixed. A heart attack is also called a myocardial infarction, or MI. If you think you are having a heart attack, do not wait to see if the symptoms will go away. Get medical help right away. What are the causes? This condition may be caused by:  A fatty substance (plaque) in the blood vessels (arteries). This can block the flow of blood to the heart.  A blood clot in the blood vessels that go to the heart. The blood clot blocks blood flow.  Low blood pressure.  An abnormal heartbeat.  Some diseases, such as problems in red blood cells (anemia)orproblems in breathing (respiratory failure).  Tightening (spasm) of a blood vessel that cuts off blood to the heart.  A tear in a blood vessel of the heart.  High blood pressure.   What increases the risk? The following factors may make you more likely to develop this condition:  Aging. The older you are, the higher your risk.  Having a personal or family history of chest pain, heart attack, stroke, or narrowing of the arteries in the legs, arms, head, or stomach (peripheral artery disease).  Being female.  Smoking.  Not getting regular exercise.  Being overweight or obese.  Having high blood pressure.  Having high cholesterol.  Having diabetes.  Drinking too much alcohol.  Using illegal drugs, such as cocaine or methamphetamine. What are the signs or symptoms? Symptoms of this condition include:  Chest pain. It may feel like: ? Crushing or squeezing. ? Tightness, pressure, fullness, or heaviness.  Pain in the arm, neck, jaw, back, or upper body.  Shortness of breath.  Heartburn.  Upset stomach (indigestion).  Feeling like you may vomit (nauseous).  Cold sweats.  Feeling tired.  Sudden light-headedness. How is this treated? A heart attack must be treated as soon as  possible. Treatment may include:  Medicines to: ? Break up or dissolve blood clots. ? Thin blood and help prevent blood clots. ? Treat blood pressure. ? Improve blood flow to the heart. ? Reduce pain. ? Reduce cholesterol.  Procedures to widen a blocked artery and keep it open.  Open heart surgery.  Receiving oxygen.  Making your heart strong again (cardiac rehabilitation) through exercise, education, and counseling.   Follow these instructions at home: Medicines  Take over-the-counter and prescription medicines only as told by your doctor. You may need to take medicine: ? To keep your blood from clotting too easily. ? To control blood pressure. ? To lower cholesterol. ? To control heart rhythms.  Do not take these medicines unless your doctor says it is okay: ? NSAIDs, such as ibuprofen. ? Supplements that have vitamin A, vitamin E, or both. ? Hormone replacement therapy that has estrogen with or without progestin. Lifestyle  Do not use any products that have nicotine or tobacco, such as cigarettes, e-cigarettes, and chewing tobacco. If you need help quitting, ask your doctor.  Avoid secondhand smoke.  Exercise regularly. Ask your doctor about a cardiac rehab program.  Eat heart-healthy foods. Your doctor will tell you what foods to eat.  Stay at a healthy weight.  Lower your stress level.  Do not use illegal drugs.      Alcohol use  Do not drink alcohol if: ? Your doctor tells you not to drink. ? You are pregnant, may be pregnant, or   are planning to become pregnant.  If you drink alcohol: ? Limit how much you use to:  0-1 drink a day for women.  0-2 drinks a day for men. ? Know how much alcohol is in your drink. In the [place] one drink equals one 12 oz bottle of beer (355 mL), one 5 oz glass of wine (148 mL), or one 1 oz glass of hard liquor (44 mL). General instructions  Work with your doctor to treat other problems you may have, such as diabetes or  high blood pressure.  Get screened for depression. Get treatment if needed.  Keep your vaccines up to date. Get the flu shot (influenza vaccine) every year.  Keep all follow-up visits as told by your doctor. This is important. Contact a doctor if:  You feel very sad.  You have trouble doing your daily activities. Get help right away if:  You have sudden, unexplained discomfort in your chest, arms, back, neck, jaw, or upper body.  You have shortness of breath.  You have sudden sweating or clammy skin.  You feel like you may vomit.  You vomit.  You feel tired or weak.  You get light-headed or dizzy.  You feel your heart beating fast.  You feel your heart skipping beats.  You have blood pressure that is higher than 180/120. These symptoms may be an emergency. Do not wait to see if the symptoms will go away. Get medical help right away. Call your local emergency services (911 in the [place] Do not drive yourself to the hospital. Summary  A heart attack occurs when blood and oxygen supply to the heart is cut off.  Do not take NSAIDs unless your doctor says it is okay.  Do not smoke. Avoid secondhand smoke.  Exercise regularly. Ask your doctor about a cardiac rehab program. This information is not intended to replace advice given to you by your health care provider. Make sure you discuss any questions you have with your health care provider. Document Revised: 07/29/2018 Document Reviewed: 07/29/2018 Elsevier Patient Education  2021 [employer]  

## 2020-08-20 NOTE — Progress Notes (Signed)
[facility] hospital Liaison note.  New referral for [facility] community Palliative program to follow at home received from Dr. Manuella Ghazi. Patient information given to referral. Will follow for discharge disposition. Thank you for this referral.  Flo Shanks BSN, RN, [facility] [phone]

## 2020-08-20 NOTE — Progress Notes (Signed)
Progress Note  Patient Name: Maureen Morales Date of Encounter: 08/20/2020  [facility] Cardiologist: [name], MD   Subjective   BP was better yesterday. This AM elevated, but this was before medications. No further chest pain. Possible d/c today. Need to re-check BP after medications.  Inpatient Medications    Scheduled Meds: . vitamin C  500 mg Oral BID  . atorvastatin  40 mg Oral Daily  . carvedilol  12.5 mg Oral BID WC  . clopidogrel  75 mg Oral Daily  . hydrALAZINE  75 mg Oral 2 times per day   And  . hydrALAZINE  50 mg Oral QPM  . isosorbide mononitrate  60 mg Oral Daily  . loratadine  10 mg Oral Daily  . pantoprazole  40 mg Oral QAC supper  . sertraline  75 mg Oral Daily   Continuous Infusions: . heparin 850 Units/hr (08/20/20 0619)   PRN Meds: acetaminophen **OR** acetaminophen, albuterol, ondansetron **OR** ondansetron (ZOFRAN) IV, traMADol   Vital Signs    Vitals:   08/19/20 1413 08/19/20 2030 08/20/20 0502 08/20/20 0739  BP: (!) 133/59 (!) 125/59 (!) 148/76 (!) 183/69  Pulse: 73 73 72 74  Resp: 20 20 18 19   Temp: 97.7 F (36.5 C) 98.3 F (36.8 C) 97.6 F (36.4 C) 97.9 F (36.6 C)  TempSrc: Oral Oral Oral Oral  SpO2: 98% 95% 95% 97%  Weight:   62.6 kg   Height:        Intake/Output Summary (Last 24 hours) at 08/20/2020 0848 Last data filed at 08/20/2020 0741 Gross per 24 hour  Intake 120 ml  Output 2200 ml  Net -2080 ml   Last 3 Weights 08/20/2020 08/18/2020 08/18/2020  Weight (lbs) 138 lb 143 lb 4.8 oz 138 lb 14.2 oz  Weight (kg) 62.596 kg 65 kg 63 kg  Some encounter information is confidential and restricted. Go to Review Flowsheets activity to see all data.      Telemetry    SR, HR 70s - Personally Reviewed  ECG    No new tracing - Personally Reviewed  Physical Exam   GEN: No acute distress.   Neck: No JVD Cardiac: RRR, no murmurs, rubs, or gallops.  Respiratory: Clear to auscultation bilaterally. GI: Soft, nontender,  non-distended  MS: No edema; No deformity. Neuro:  Nonfocal  Psych: Normal affect   Labs    High Sensitivity Troponin:   Recent Labs  Lab 08/18/20 1836 08/18/20 2004  TROPONINIHS 1,663* 1,624*      Chemistry Recent Labs  Lab 08/18/20 1836 08/19/20 0437 08/20/20 0418  NA 136 136 138  K 3.3* 4.3 3.7  CL 103 105 106  CO2 23 23 22   GLUCOSE 152* 139* 228*  BUN 32* 27* 35*  CREATININE 1.20* 1.09* 1.17*  CALCIUM 9.5 9.0 9.1  GFRNONAA 42* 47* 43*  ANIONGAP 10 8 10      Hematology Recent Labs  Lab 08/18/20 1836 08/19/20 0437 08/20/20 0418  WBC 9.1 8.9 8.9  RBC 4.25 4.05 4.03  HGB 12.1 11.5* 11.7*  HCT 35.1* 34.0* 34.0*  MCV 82.6 84.0 84.4  MCH 28.5 28.4 29.0  MCHC 34.5 33.8 34.4  RDW 13.3 13.3 13.6  PLT 171 153 164    BNPNo results for input(s): BNP, PROBNP in the last 168 hours.   DDimer No results for input(s): DDIMER in the last 168 hours.   Radiology    DG Chest 2 View  Result Date: 08/18/2020 CLINICAL DATA:  Substernal chest  pain for 1 day EXAM: CHEST - 2 VIEW COMPARISON:  07/16/2019 FINDINGS: Cardiac shadow is at the upper limits of normal in size but accentuated by the frontal technique. Aortic calcifications are seen. The lungs are well aerated bilaterally. Minimal basilar atelectasis is seen without focal confluent infiltrate. No sizable effusion is noted. Degenerative changes of the thoracic spine are noted. IMPRESSION: Mild bibasilar atelectasis. Electronically Signed   By: Alcide Clever M.D.   On: 08/18/2020 19:41   ECHOCARDIOGRAM COMPLETE  Result Date: 08/19/2020    ECHOCARDIOGRAM REPORT   Patient Name:   Maureen Morales Date of Exam: 08/19/2020 Medical Rec #:  [id number]     Height:       61.0 in Accession #:    [id number]    Weight:       143.3 lb Date of Birth:  12/13/1922    BSA:          1.639 m Patient Age:    87 years      BP:           162/82 mmHg Patient Gender: F             HR:           71 bpm. Exam Location:  [facility] Procedure: 2D Echo, Cardiac  Doppler and Color Doppler Indications:     NSTEMI I21.4  History:         Patient has prior history of Echocardiogram examinations, most                  recent 11/04/2019. Risk Factors:Hypertension and Diabetes. Aortic                  valkve stenosis, NSTEMI.  Sonographer:     Cristela Blue RDCS (AE) Referring Phys:  [id number] AMY N [facility] Diagnosing Phys: Julien Nordmann MD IMPRESSIONS  1. Left ventricular ejection fraction, by estimation, is 60 to 65%. The left ventricle has normal function. The left ventricle has no regional wall motion abnormalities. There is moderate left ventricular hypertrophy. Left ventricular diastolic parameters are consistent with Grade I diastolic dysfunction (impaired relaxation).  2. Right ventricular systolic function is normal. The right ventricular size is normal.  3. The mitral valve is normal in structure. Mild to moderate mitral valve regurgitation.  4. The aortic valve is normal in structure. Aortic valve regurgitation is not visualized. Mild to moderate aortic valve stenosis. Aortic valve area, by VTI measures 1.70 cm. Aortic valve mean gradient measures 16.3 mmHg. Aortic valve Vmax measures 2.60  m/s. FINDINGS  Left Ventricle: Left ventricular ejection fraction, by estimation, is 60 to 65%. The left ventricle has normal function. The left ventricle has no regional wall motion abnormalities. The left ventricular internal cavity size was normal in size. There is  moderate left ventricular hypertrophy. Left ventricular diastolic parameters are consistent with Grade I diastolic dysfunction (impaired relaxation). Right Ventricle: The right ventricular size is normal. No increase in right ventricular wall thickness. Right ventricular systolic function is normal. Left Atrium: Left atrial size was normal in size. Right Atrium: Right atrial size was normal in size. Pericardium: There is no evidence of pericardial effusion. Mitral Valve: The mitral valve is normal in structure. Moderate mitral  annular calcification. Mild to moderate mitral valve regurgitation. No evidence of mitral valve stenosis. Tricuspid Valve: The tricuspid valve is normal in structure. Tricuspid valve regurgitation is mild . No evidence of tricuspid stenosis. Aortic Valve: The aortic valve is normal in structure. Aortic  valve regurgitation is not visualized. Mild to moderate aortic stenosis is present. Aortic valve mean gradient measures 16.3 mmHg. Aortic valve peak gradient measures 27.0 mmHg. Aortic valve area, by VTI measures 1.70 cm. Pulmonic Valve: The pulmonic valve was normal in structure. Pulmonic valve regurgitation is not visualized. No evidence of pulmonic stenosis. Aorta: The aortic root is normal in size and structure. Venous: The inferior vena cava is normal in size with greater than 50% respiratory variability, suggesting right atrial pressure of 3 mmHg. IAS/Shunts: No atrial level shunt detected by color flow Doppler.  LEFT VENTRICLE PLAX 2D LVIDd:         3.76 cm  Diastology LVIDs:         2.33 cm  LV e' medial:    3.33 cm/s LV PW:         1.36 cm  LV E/e' medial:  28.3 LV IVS:        0.85 cm  LV e' lateral:   3.02 cm/s LVOT diam:     2.00 cm  LV E/e' lateral: 31.3 LV SV:         93 LV SV Index:   57 LVOT Area:     3.14 cm  RIGHT VENTRICLE RV Basal diam:  2.05 cm RV S prime:     17.40 cm/s TAPSE (M-mode): 2.7 cm LEFT ATRIUM             Index       RIGHT ATRIUM           Index LA diam:        3.30 cm 2.01 cm/m  RA Area:     13.50 cm LA Vol (A2C):   57.8 ml 35.26 ml/m RA Volume:   27.30 ml  16.65 ml/m LA Vol (A4C):   69.3 ml 42.28 ml/m LA Biplane Vol: 64.0 ml 39.04 ml/m  AORTIC VALVE                    PULMONIC VALVE AV Area (Vmax):    1.51 cm     PV Vmax:        0.92 m/s AV Area (Vmean):   1.53 cm     PV Peak grad:   3.4 mmHg AV Area (VTI):     1.70 cm     RVOT Peak grad: 3 mmHg AV Vmax:           259.67 cm/s AV Vmean:          186.333 cm/s AV VTI:            0.549 m AV Peak Grad:      27.0 mmHg AV Mean  Grad:      16.3 mmHg LVOT Vmax:         125.00 cm/s LVOT Vmean:        90.700 cm/s LVOT VTI:          0.297 m LVOT/AV VTI ratio: 0.54  AORTA Ao Root diam: 3.00 cm MITRAL VALVE                TRICUSPID VALVE MV Area (PHT): 2.08 cm     TR Peak grad:   17.8 mmHg MV Decel Time: 365 msec     TR Vmax:        211.00 cm/s MV E velocity: 94.40 cm/s MV A velocity: 117.00 cm/s  SHUNTS MV E/A ratio:  0.81         Systemic VTI:  0.30 m  Systemic Diam: 2.00 cm Ida Rogue MD Electronically signed by Ida Rogue MD Signature Date/Time: 08/19/2020/4:47:55 PM    Final     Cardiac Studies   Echo 08/19/20 1. Left ventricular ejection fraction, by estimation, is 60 to 65%. The  left ventricle has normal function. The left ventricle has no regional  wall motion abnormalities. There is moderate left ventricular hypertrophy.  Left ventricular diastolic  parameters are consistent with Grade I diastolic dysfunction (impaired  relaxation).  2. Right ventricular systolic function is normal. The right ventricular  size is normal.  3. The mitral valve is normal in structure. Mild to moderate mitral valve  regurgitation.  4. The aortic valve is normal in structure. Aortic valve regurgitation is  not visualized. Mild to moderate aortic valve stenosis. Aortic valve area,  by VTI measures 1.70 cm. Aortic valve mean gradient measures 16.3 mmHg.  Aortic valve Vmax measures 2.60  m/s.   Cardiac cath 2018   The left ventricular systolic function is normal.  LV end diastolic pressure is moderately elevated.  The left ventricular ejection fraction is 55-65% by visual estimate.  Mid RCA lesion, 60 %stenosed.  Dist LAD lesion, 40 %stenosed.  Mid LAD lesion, 30 %stenosed.   1. Mild to moderate nonobstructive coronary artery disease. 2. Normal LV systolic function. 3. Mild aortic stenosis with a peak to peak gradient of 15 mmHg. There is also possibly a mild LVOT gradient. 4.  Severely elevated systemic pressure with moderately elevated left ventricular end-diastolic pressure.  Recommendations: I do not see a culprit for non-ST elevation myocardial infarction. The worse stenosis of 60% in the midright coronary artery. It is possible that her myocardial infarction was due to supply demand ischemia in the setting of  elevated blood pressure. I recommend blood pressure control and Plavix for 1 year given that she is allergic to aspirin. I doubled the dose of Toprol to 100 mg once daily. I increased the dose of atorvastatin to 40 mg daily.  Patient Profile     87 y.o. female with pmh of HTN, AV stenosis, CAD, DM2 who is being seen for NSTEMI and hypertensive urgency.   Assessment & Plan    NSTEMI - chest pain in the setting of severely elevated BP with systolics in the 253G. HS trop pealed at 1624.  Improved with NTG and hydralazine. - PTA coreg 6.25mg  BID, hydralazine 75mg  am/noon and 50mg  pm, HCTZ 12.5mg  daily, Isosorbide 60mg  daily, losartan 100mg  daily - She has prior NSETMI in the setting of hypertension. Cath in [address] showed nonobstructive CAD and medical management recommended at that time - Echo this admission showed preserved LVEF 60-65%, mod LVH, G1DD, mild to mod MR, mild to mod AS - Coreg increased to 12.5mg  BID - IV heparin x 48 hours, can d/c today - Plavix started  - continue statin - No plan for invasive work-up, continue conservative management  Hypertensive Urgency - home HCTZ and Losartan held - home hydral, Imdur and coreg continued - Coreg increased as above - Most recent BP 183/69, before meds? Mildly elevated yesterday throughout the day - increase coreg to 25mg  daily, can likely add back home losartan  HFpEF Mild to mod MR/AS - Euvolemic on exam - continue coreg, Isosorbide 60mg  daily - Echo this admission with preserved EF and G1DD  HLD - continue statin  For questions or updates, please contact [facility] Please consult  [email or web address] for contact info under        Signed, Johna Kearl Ninfa Meeker, PA-C  08/20/2020, 8:49 AM

## 2020-08-20 NOTE — Consult Note (Signed)
[place] for heparin infusion Indication: chest pain/ACS  Allergies  Allergen Reactions  . Asa [Aspirin] Anaphylaxis and Swelling  . Clindamycin     dizziness  . Oxytetracycline Hives  . Phenobarbital Hives  . Atropine Other (See Comments)    Reaction: unknown  . Belladonna Alkaloids Other (See Comments)    Reaction: unknown  . Codeine Nausea And Vomiting  . Darvon [Propoxyphene] Nausea And Vomiting  . Demerol [Meperidine] Nausea And Vomiting  . Methocarbamol Other (See Comments)    Reaction: unknown  . Penicillins Other (See Comments)    Pt and family unsure of details  . Sulfa Antibiotics Other (See Comments)    Reaction: unknown  . Iron Other (See Comments)    High dose prescription gives her diarrhea    Patient Measurements: Height: 5\' 1"  (154.9 cm) Weight: 62.6 kg (138 lb) IBW/kg (Calculated) : 47.8 Heparin Dosing Weight: 60.7 kg  Vital Signs: Temp: 97.6 F (36.4 C) (04/22 0502) Temp Source: Oral (04/22 0502) BP: 148/76 (04/22 0502) Pulse Rate: 72 (04/22 0502)  Labs: Recent Labs    08/18/20 1836 08/18/20 1954 08/18/20 2004 08/19/20 0437 08/19/20 1232 08/20/20 0418  HGB 12.1  --   --  11.5*  --  11.7*  HCT 35.1*  --   --  34.0*  --  34.0*  PLT 171  --   --  153  --  164  APTT  --  33  --   --   --   --   LABPROT  --  14.5  --   --   --   --   INR  --  1.1  --   --   --   --   HEPARINUNFRC  --   --   --  0.38 0.32 0.26*  CREATININE 1.20*  --   --  1.09*  --  1.17*  TROPONINIHS 1,663*  --  1,624*  --   --   --     Estimated Creatinine Clearance: 24.4 mL/min (A) (by C-G formula based on SCr of 1.17 mg/dL (H)).   Medical History: Past Medical History:  Diagnosis Date  . Aortic stenosis    a. 08/2016 Echo: Hyperdynamic LV fxn, mild AS (mean grad 47mmHg), mild MR, mild PAH; b. 05/2017 Echo: Hyperdynamic LV fxn, mod AS (mean grad 63mmHg, valve area 1.84). PASP 141mmHg.  . Asthma without status asthmaticus     unspecified  . Closed right hip fracture ([place]) 05/26/2017  . Colitis    unspecified  . Complication of anesthesia    sensitive to anesthesia  . Depression   . Diabetes mellitus type 2, uncomplicated ([place])   . Essential hypertension   . GERD (gastroesophageal reflux disease)   . 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 Hyperlipidemia    unspecified  . Hypertension    a. 08/2017 Renal U/S: No RAS.  [name] Low back pain with left-sided sciatica 04/29/2018  . Myocardial infarction ([place]) 2018  . Non-obstructive CAD (coronary artery disease)    a. 08/2016 NSTEMI/Cath: mild to mod nonobs dzs including 60% mRCA stenosis-->Med rx.  . NSTEMI (non-ST elevated myocardial infarction) ([place]) 08/30/2016  . Nummular eczema 01/16/2011   Occurring on chest and under breast.  Treated by Dr Evorn Gong with fluocinonide 0.05% topical 06/26/12   . Peripheral vascular disease ([place])   . 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 Positive H. pylori test   .  Pre-diabetes   . Suspected COVID-19 virus infection 01/10/2019   Patient reporting malaise and headache for the past 3 days,  Similar to presentation when she was positive for  COVID 19 INFECTION  July 2 .  Previous inection was caused by exposure to COVID positive caregiver.  Repeat scenario:  Same caregiver's mother tested positive on Monday and caregiver saw current patient on Tuesday    . Viral gastroenteritis due to [place]-like agent Nov 2012    Medications:  No prior anticoagulation noted  Assessment: 87 y.o. female with PMH of hypertension, CAD who presents to the ED complaining of chest pain. Troponin noted to be elevated at 1600. Pharmacy has been consulted for initiation and management of heparin infusion for ACS.  Heparin Dosing Weight: 60.7 kg  Goal of Therapy:  Heparin level 0.3-0.7 units/ml Monitor platelets by anticoagulation protocol: Yes   4/21 0437 HL 0.38, therapeutic x 1 4/21 1232 HL 0.32,  therapeutic x 2 4/22 0418 HL 0.26, subtherapeutic   Plan:  Give 900 unit bolus.   Increase heparin infusion to 850 units/hr.  Recheck HL in 8 hours. Daily CBC with AM labs.  Plan to continue heparin for 24 hours.   Renda Rolls, PharmD, MBA 08/20/2020 6:00 AM

## 2020-08-20 NOTE — Progress Notes (Signed)
PT Cancellation Note  Patient Details Name: Maureen Morales MRN: [id number] DOB: Dec 22, 1922   Cancelled Treatment:    Reason Eval/Treat Not Completed: Other (comment)   Offered session x 2 this am.  She declined both sessions.  Stated she felt she was safe and able to complete tasks needed for discharge home.  Stated she primarily uses wheelchair at home for mobility and does not walk "they don't want me to fall".  Encouraged participation but she stated she has had therapy before and did not feel like she needed it anymore.  Will continue to offer services during admission.    Chesley Noon 08/20/2020, 9:12 AM

## 2020-08-20 NOTE — Progress Notes (Signed)
Mobility Specialist - Progress Note   08/20/20 1200  Mobility  Activity Repositioned in chair ((bed))  Level of Assistance Minimal assist, patient does 75% or more  Assistive Device None  Distance Ambulated (ft) 0 ft  Mobility Response Tolerated well  Mobility performed by Mobility specialist  $Mobility charge 1 Mobility    Pt declined session this date, reports no needs. Denies chest pain. Mobility entered again shortly after exit in response to the bed alarm. Pt attempting to sit up in bed to play a card game with family at bedside. Mobility and family member assisted with repositioning to [facility] Bed alarm set upon exit.    Kathee Delton Mobility Specialist 08/20/20, 12:50 PM

## 2020-08-20 NOTE — Telephone Encounter (Signed)
Transition Care Management Unsuccessful Follow-up Telephone Call  Date of discharge and from where:  08/20/20 from [facility]  Attempts:  1st Attempt  Reason for unsuccessful TCM follow-up call:  Unable to reach patient

## 2020-08-20 NOTE — Progress Notes (Signed)
Patient discharged to home. Tele and IV removed prior to discharge. Verbalize understanding of discharge instructions.

## 2020-08-20 NOTE — Progress Notes (Signed)
   Daily Progress Note   Patient Name: Maureen Morales       Date: 08/20/2020 DOB: 01/03/1923  Age: 87 y.o. MRN#: [id number] Attending Physician: Max Sane, MD Primary Care Physician: [name], MD Admit Date: 08/18/2020  Reason for Consultation/Follow-up: Establishing goals of care  Subjective: Patient awake and alert.  She is sitting up at the bedside playing cards with her daughter.  States she is waiting for morning medications and is requesting ice water.  Denies pain or shortness of breath.  States she feels much better today.  Reviewed goals of care discussion.  Patient and daughter there is appreciation of continued support and conversations.  Patient is hopeful she will discharge home if not today then tomorrow.  Daughter shares patient lives to be in her home environment versus being in the hospital.  Education provided on MOST form however they have declined to complete express and patient has an active advance directive.  Education provided on outpatient palliative support and appropriate time to transition patient to a more hospice focused care.  Daughter shares her understanding and history with hospice doing her father's death.  She is appreciative of their support in the home and states she will discuss further with outpatient palliative as they feel most appropriate.  All questions answered and support provided.  Length of Stay: 1 day  Vital Signs: BP (!) 159/59 (BP Location: Left Arm)   Pulse 78   Temp 98.1 F (36.7 C)   Resp 18   Ht 5\' 1"  (1.549 m)   Wt 62.6 kg   SpO2 96%   BMI 26.07 kg/m  SpO2: SpO2: 96 % O2 Device: O2 Device: Room Air O2 Flow Rate: O2 Flow Rate (L/min): 2 L/min  Physical Exam: Awake and alert.  NAD RRR Diminished bilaterally AAOx3, mood appropriate             Palliative Care Assessment & Plan  HPI:  Palliative Care consult requested for goals of care discussion in this 87 y.o. female with a medical history significant for  hypertension, hyperlipidemia, coronary artery disease, NSTEMI (08/2016), diabetes type 2, right hip fracture (1540), diastolic CHF, and asthma.  Patient presented to the ER from home via private vehicle with concerns of chest pain.  Work-up showed NSTEMI.  Patient followed by cardiology and started on IV heparin and nitroglycerin.  Goals of Care/Recommendations:  Continue to treat the treatable with no escalation of care.  Patient and family hopeful she will discharge home soon and return to her normal routine.  Outpatient palliative support.  Daughter shares she plans to continue ongoing discussions and make decisions on transitioning patient care to focus on a more hospice level once home.  PMT will continue to support and follow as needed.  No weekend coverage.  Thank you for allowing the Palliative Medicine Team to assist in the care of this patient.  Time Total: 35 min.   Visit consisted of counseling and education dealing with the complex and emotionally intense issues of symptom management and palliative care in the setting of serious and potentially life-threatening illness.Greater than 50%  of this time was spent counseling and coordinating care related to the above assessment and plan.  Alda Lea, AGPCNP-BC  Palliative Medicine Team [phone]

## 2020-08-21 DIAGNOSIS — M5442 Lumbago with sciatica, left side: Secondary | ICD-10-CM | POA: Diagnosis not present

## 2020-08-21 DIAGNOSIS — F411 Generalized anxiety disorder: Secondary | ICD-10-CM | POA: Diagnosis not present

## 2020-08-21 DIAGNOSIS — Z8619 Personal history of other infectious and parasitic diseases: Secondary | ICD-10-CM | POA: Diagnosis not present

## 2020-08-21 DIAGNOSIS — I2510 Atherosclerotic heart disease of native coronary artery without angina pectoris: Secondary | ICD-10-CM | POA: Diagnosis not present

## 2020-08-21 DIAGNOSIS — Z9049 Acquired absence of other specified parts of digestive tract: Secondary | ICD-10-CM | POA: Diagnosis not present

## 2020-08-21 DIAGNOSIS — I214 Non-ST elevation (NSTEMI) myocardial infarction: Secondary | ICD-10-CM | POA: Diagnosis not present

## 2020-08-21 DIAGNOSIS — F32A Depression, unspecified: Secondary | ICD-10-CM | POA: Diagnosis not present

## 2020-08-21 DIAGNOSIS — R296 Repeated falls: Secondary | ICD-10-CM | POA: Diagnosis not present

## 2020-08-21 DIAGNOSIS — E785 Hyperlipidemia, unspecified: Secondary | ICD-10-CM | POA: Diagnosis not present

## 2020-08-21 DIAGNOSIS — K219 Gastro-esophageal reflux disease without esophagitis: Secondary | ICD-10-CM | POA: Diagnosis not present

## 2020-08-21 DIAGNOSIS — Z90710 Acquired absence of both cervix and uterus: Secondary | ICD-10-CM | POA: Diagnosis not present

## 2020-08-21 DIAGNOSIS — J45909 Unspecified asthma, uncomplicated: Secondary | ICD-10-CM | POA: Diagnosis not present

## 2020-08-21 DIAGNOSIS — I252 Old myocardial infarction: Secondary | ICD-10-CM | POA: Diagnosis not present

## 2020-08-21 DIAGNOSIS — H905 Unspecified sensorineural hearing loss: Secondary | ICD-10-CM | POA: Diagnosis not present

## 2020-08-21 DIAGNOSIS — J849 Interstitial pulmonary disease, unspecified: Secondary | ICD-10-CM | POA: Diagnosis not present

## 2020-08-21 DIAGNOSIS — I350 Nonrheumatic aortic (valve) stenosis: Secondary | ICD-10-CM | POA: Diagnosis not present

## 2020-08-21 DIAGNOSIS — I5032 Chronic diastolic (congestive) heart failure: Secondary | ICD-10-CM | POA: Diagnosis not present

## 2020-08-21 DIAGNOSIS — Z8616 Personal history of COVID-19: Secondary | ICD-10-CM | POA: Diagnosis not present

## 2020-08-21 DIAGNOSIS — D509 Iron deficiency anemia, unspecified: Secondary | ICD-10-CM | POA: Diagnosis not present

## 2020-08-21 DIAGNOSIS — N183 Chronic kidney disease, stage 3 unspecified: Secondary | ICD-10-CM | POA: Diagnosis not present

## 2020-08-21 DIAGNOSIS — I130 Hypertensive heart and chronic kidney disease with heart failure and stage 1 through stage 4 chronic kidney disease, or unspecified chronic kidney disease: Secondary | ICD-10-CM | POA: Diagnosis not present

## 2020-08-21 DIAGNOSIS — Z8781 Personal history of (healed) traumatic fracture: Secondary | ICD-10-CM | POA: Diagnosis not present

## 2020-08-21 DIAGNOSIS — E1151 Type 2 diabetes mellitus with diabetic peripheral angiopathy without gangrene: Secondary | ICD-10-CM | POA: Diagnosis not present

## 2020-08-21 DIAGNOSIS — E1121 Type 2 diabetes mellitus with diabetic nephropathy: Secondary | ICD-10-CM | POA: Diagnosis not present

## 2020-08-21 DIAGNOSIS — E1122 Type 2 diabetes mellitus with diabetic chronic kidney disease: Secondary | ICD-10-CM | POA: Diagnosis not present

## 2020-08-22 NOTE — Discharge Summary (Signed)
[facility] NAME: Maureen Morales    MR#:  [id number]  DATE OF BIRTH:  13-Mar-1923  DATE OF ADMISSION:  08/18/2020   ADMITTING PHYSICIAN: Max Sane, MD  DATE OF DISCHARGE: 08/20/2020  3:53 PM  PRIMARY CARE PHYSICIAN: [name], MD   ADMISSION DIAGNOSIS:  NSTEMI (non-ST elevated myocardial infarction) ([place]) [I21.4] Nonspecific chest pain [R07.9] DISCHARGE DIAGNOSIS:  Principal Problem:   NSTEMI (non-ST elevated myocardial infarction) ([place]) Active Problems:   Essential hypertension   Type II diabetes mellitus with nephropathy (HCC)   Chronic interstitial lung disease (HCC)   Anemia, iron deficiency   Generalized anxiety disorder   Recurrent falls   AKI (acute kidney injury) ([place])   Stage 3 chronic kidney disease (HCC)   Chronic diastolic congestive heart failure (HCC)   Diastolic dysfunction without heart failure  SECONDARY DIAGNOSIS:   Past Medical History:  Diagnosis Date  . Aortic stenosis    a. 08/2016 Echo: Hyperdynamic LV fxn, mild AS (mean grad 101mmHg), mild MR, mild PAH; b. 05/2017 Echo: Hyperdynamic LV fxn, mod AS (mean grad 53mmHg, valve area 1.84). PASP 148mmHg.  . Asthma without status asthmaticus    unspecified  . Closed right hip fracture ([place]) 05/26/2017  . Colitis    unspecified  . Complication of anesthesia    sensitive to anesthesia  . Depression   . Diabetes mellitus type 2, uncomplicated ([place])   . Essential hypertension   . GERD (gastroesophageal reflux disease)   . 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 Hyperlipidemia    unspecified  . Hypertension    a. 08/2017 Renal U/S: No RAS.  [name] Low back pain with left-sided sciatica 04/29/2018  . Myocardial infarction ([place]) 2018  . Non-obstructive CAD (coronary artery disease)    a. 08/2016 NSTEMI/Cath: mild to mod nonobs dzs including 60% mRCA stenosis-->Med rx.  . NSTEMI (non-ST elevated myocardial infarction) ([place]) 08/30/2016  . Nummular eczema 01/16/2011    Occurring on chest and under breast.  Treated by Dr Evorn Gong with fluocinonide 0.05% topical 06/26/12   . Peripheral vascular disease ([place])   . 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 Positive H. pylori test   . Pre-diabetes   . Suspected COVID-19 virus infection 01/10/2019   Patient reporting malaise and headache for the past 3 days,  Similar to presentation when she was positive for  COVID 19 INFECTION  July 2 .  Previous inection was caused by exposure to COVID positive caregiver.  Repeat scenario:  Same caregiver's mother tested positive on Monday and caregiver saw current patient on Tuesday    . Viral gastroenteritis due to [place]-like agent Nov 2012   HOSPITAL COURSE:  87 y.o.femalewith medical history significant forcoronary artery disease, hypertension, hyperlipidemia, history of NSTEMI in May 2018, non-insulin-dependent diabetes mellitus, aortic stenosis, history of right hip fracture in 2409, grade 2 diastolic dysfunction, history of asthma, history of nonobstructive coronary artery disease, history of H. pylori positive admitted for chief concerns of chest pressure  Non-STEMI Due to demand ischemia from uncontrolled hypertension.  Troponin peaked at 1624 - Echocardiogram showed normal ejection fraction - Cath deferred as patient not symptomatic and not interested. Cardio in agreement. - at D/C -> Carvedilol increased up to 12.5 twice daily, continue losartan 100, HCTZ 12.5 daily, Imdur 30 mg in the morning, 60 mg nightly  -Started Plavix 75 mg daily in stead of asa (reported anaphylaxis) -Close follow-up with  Dr. Fletcher Anon as an outpt   #Heart failure preserved ejection fraction-well compensated at this time. Echo as above  #Hypertensive Urgency - meds adjusted as above  #Hyperlipidemia-atorvastatin 40 mg p.o. daily   #Depression-sertraline 75 mg p.o. daily   #GERD-pantoprazole 40 mg daily   Patient is agreeable for  palliative care as an outpt.   DISCHARGE CONDITIONS:  stable CONSULTS OBTAINED:  Treatment Team:  Minna Merritts, MD DRUG ALLERGIES:   Allergies  Allergen Reactions  . Asa [Aspirin] Anaphylaxis and Swelling  . Clindamycin     dizziness  . Oxytetracycline Hives  . Phenobarbital Hives  . Atropine Other (See Comments)    Reaction: unknown  . Belladonna Alkaloids Other (See Comments)    Reaction: unknown  . Codeine Nausea And Vomiting  . Darvon [Propoxyphene] Nausea And Vomiting  . Demerol [Meperidine] Nausea And Vomiting  . Methocarbamol Other (See Comments)    Reaction: unknown  . Penicillins Other (See Comments)    Pt and family unsure of details  . Sulfa Antibiotics Other (See Comments)    Reaction: unknown  . Iron Other (See Comments)    High dose prescription gives her diarrhea   DISCHARGE MEDICATIONS:   Allergies as of 08/20/2020      Reactions   Asa [aspirin] Anaphylaxis, Swelling   Clindamycin    dizziness   Oxytetracycline Hives   Phenobarbital Hives   Atropine Other (See Comments)   Reaction: unknown   Belladonna Alkaloids Other (See Comments)   Reaction: unknown   Codeine Nausea And Vomiting   Darvon [propoxyphene] Nausea And Vomiting   Demerol [meperidine] Nausea And Vomiting   Methocarbamol Other (See Comments)   Reaction: unknown   Penicillins Other (See Comments)   Pt and family unsure of details   Sulfa Antibiotics Other (See Comments)   Reaction: unknown   Iron Other (See Comments)   High dose prescription gives her diarrhea      Medication List    TAKE these medications   acetaminophen 500 MG tablet Commonly known as: TYLENOL Take 1,000 mg by mouth every 6 (six) hours as needed for moderate pain or headache.   albuterol 108 (90 Base) MCG/ACT inhaler Commonly known as: VENTOLIN HFA Inhale 2 puffs into the lungs every 6 (six) hours as needed for wheezing or shortness of breath.   atorvastatin 40 MG tablet Commonly known as:  LIPITOR TAKE 1 TABLET BY MOUTH DAILY What changed: when to take this   carvedilol 12.5 MG tablet Commonly known as: COREG Take 1 tablet (12.5 mg total) by mouth 2 (two) times daily with a meal. What changed:   medication strength  See the new instructions.   clopidogrel 75 MG tablet Commonly known as: PLAVIX Take 1 tablet (75 mg total) by mouth daily.   famotidine 10 MG tablet Commonly known as: PEPCID Take 10 mg by mouth daily as needed for heartburn or indigestion.   glipiZIDE 5 MG tablet Commonly known as: GLUCOTROL TAKE 1 TABLET BY MOUTH TWICE DAILY BEFORE A MEAL   glucose blood test strip Commonly known as: Estate manager/land agent Use as instructed   hydrALAZINE 50 MG tablet Commonly known as: APRESOLINE Take 50-75 mg by mouth See admin instructions. Take 75 mg with breakfast, 75 mg with lunch, and 50 mg with dinner   hydrochlorothiazide 12.5 MG capsule Commonly known as: MICROZIDE TAKE 1 CAPSULE BY MOUTH EVERY DAY What changed: how much to take   isosorbide mononitrate 30 MG 24 hr tablet Commonly known as:  IMDUR Imdur 30 mg po daily in morning and 60 mg at night What changed:   medication strength  how much to take  how to take this  when to take this  additional instructions   lansoprazole 30 MG capsule Commonly known as: PREVACID TAKE 1 CAPSULE EVERY DAY BEFORE SUPPER What changed: See the new instructions.   loratadine 10 MG tablet Commonly known as: CLARITIN Take 10 mg daily by mouth.   losartan 100 MG tablet Commonly known as: COZAAR TAKE ONE TABLET BY MOUTH EVERY DAY What changed: when to take this   LUBRICATING EYE DROPS OP Place 1 drop into both eyes daily as needed (dry eyes).   nitroGLYCERIN 0.4 MG SL tablet Commonly known as: NITROSTAT Place 1 tablet (0.4 mg total) under the tongue every 5 (five) minutes as needed for chest pain.   sertraline 50 MG tablet Commonly known as: ZOLOFT TAKE ONE AND ONE-HALF TABLETS DAILY   Trospium  Chloride 60 MG Cp24 TAKE ONE CAPSULE EACH MORNING BEFORE BREAKFAST What changed: See the new instructions.   Ultimate Probiotic Formula Caps Take 1 capsule by mouth daily with supper.   vitamin C 500 MG tablet Commonly known as: ASCORBIC ACID Take 1 tablet (500 mg total) by mouth 2 (two) times daily.      DISCHARGE INSTRUCTIONS:   DIET:  Cardiac diet DISCHARGE CONDITION:  Stable ACTIVITY:  Activity as tolerated OXYGEN:  Home Oxygen: No.  Oxygen Delivery: room air DISCHARGE LOCATION:  Home with HHPT, OT & RN - Palliative care to follow  If you experience worsening of your admission symptoms, develop shortness of breath, life threatening emergency, suicidal or homicidal thoughts you must seek medical attention immediately by calling 911 or calling your MD immediately  if symptoms less severe.  You Must read complete instructions/literature along with all the possible adverse reactions/side effects for all the Medicines you take and that have been prescribed to you. Take any new Medicines after you have completely understood and accpet all the possible adverse reactions/side effects.   Please note  You were cared for by a hospitalist during your hospital stay. If you have any questions about your discharge medications or the care you received while you were in the hospital after you are discharged, you can call the unit and asked to speak with the hospitalist on call if the hospitalist that took care of you is not available. Once you are discharged, your primary care physician will handle any further medical issues. Please note that NO REFILLS for any discharge medications will be authorized once you are discharged, as it is imperative that you return to your primary care physician (or establish a relationship with a primary care physician if you do not have one) for your aftercare needs so that they can reassess your need for medications and monitor your lab values.    On the day  of Discharge:  VITAL SIGNS:  Blood pressure (!) 159/59, pulse 78, temperature 98.1 F (36.7 C), resp. rate 18, height 5\' 1"  (1.549 m), weight 62.6 kg, SpO2 96 %. PHYSICAL EXAMINATION:  GENERAL:  87 y.o.-year-old patient lying in the bed with no acute distress.  EYES: Pupils equal, round, reactive to light and accommodation. No scleral icterus. Extraocular muscles intact.  HEENT: Head atraumatic, normocephalic. Oropharynx and nasopharynx clear.  NECK:  Supple, no jugular venous distention. No thyroid enlargement, no tenderness.  LUNGS: Normal breath sounds bilaterally, no wheezing, rales,rhonchi or crepitation. No use of accessory muscles of respiration.  CARDIOVASCULAR: S1, S2 normal. No murmurs, rubs, or gallops.  ABDOMEN: Soft, non-tender, non-distended. Bowel sounds present. No organomegaly or mass.  EXTREMITIES: No pedal edema, cyanosis, or clubbing.  NEUROLOGIC: Cranial nerves II through XII are intact. Muscle strength 5/5 in all extremities. Sensation intact. Gait not checked.  PSYCHIATRIC: The patient is alert and oriented x 3.  SKIN: No obvious rash, lesion, or ulcer.  DATA REVIEW:   CBC Recent Labs  Lab 08/20/20 0418  WBC 8.9  HGB 11.7*  HCT 34.0*  PLT 164    Chemistries  Recent Labs  Lab 08/20/20 0418  NA 138  K 3.7  CL 106  CO2 22  GLUCOSE 228*  BUN 35*  CREATININE 1.17*  CALCIUM 9.1  MG 2.2     Outpatient follow-up  Follow-up Information    [name], MD. Schedule an appointment as soon as possible for a visit on 08/24/2020.   Specialty: Internal Medicine Why: @ 2pm Contact information: [address] [place] [address] [phone]        Wellington Hampshire, MD. Schedule an appointment as soon as possible for a visit on 09/09/2020.   Specialty: Cardiology Why: @ 11:30am Patient will see the PA Contact information: [place] [address] [phone]               30 Day Unplanned  Readmission Risk Score   Flowsheet Row ED to Hosp-Admission (Discharged) from 08/18/2020 in [facility]  30 Day Unplanned Readmission Risk Score (%) 14.82 Filed at 08/20/2020 1200     This score is the patient's risk of an unplanned readmission within 30 days of being discharged (0 -100%). The score is based on dignosis, age, lab data, medications, orders, and past utilization.   Low:  0-14.9   Medium: 15-21.9   High: 22-29.9   Extreme: 30 and above         Management plans discussed with the patient, family and they are in agreement.  CODE STATUS: Prior   TOTAL TIME TAKING CARE OF THIS PATIENT: 45 minutes.    Max Sane M.D on 08/22/2020 at 10:22 PM  [facility]   CC: Primary care physician; [name], MD   Note: This dictation was prepared with Dragon dictation along with smaller phrase technology. Any transcriptional errors that result from this process are unintentional.

## 2020-08-23 ENCOUNTER — Other Ambulatory Visit: Payer: Self-pay | Admitting: *Deleted

## 2020-08-23 DIAGNOSIS — I214 Non-ST elevation (NSTEMI) myocardial infarction: Secondary | ICD-10-CM

## 2020-08-23 NOTE — Telephone Encounter (Signed)
Transition Care Management Follow-up Telephone Call  Date of discharge and from where: 08/20/20 from [facility]  How have you been since you were released from the hospital? Daughter (HIPAA compliant) reports patient is very tired today. Unsure if its just recovery from the weekend or not. BP is 137/65 at 5pm. BS not yet taken today. Denies complaints of chest pain chest pressure, headache, blurred vision and all other symptoms. Reports at baseline outside of fatigue.   Any questions or concerns? No  Items Reviewed:   Did the pt receive and understand the discharge instructions provided? Yes   Medications obtained and verified? Yes   Any new allergies since your discharge? No   Dietary orders reviewed? Yes, cardiac diet  Do you have support at home? Yes   Home Care and Equipment/Supplies: Were home health services ordered? Private care.  Home with HHPT, OT & RN- palliative care to follow.  Functional Questionnaire: (I = Independent and D = Dependent) ADLs: Caregiver assist  Maintaining continence- I  Transferring/Ambulation- walker, wheelchair  Managing Meds- Caregiver  Follow up appointments reviewed:   PCP Hospital f/u appt confirmed? Yes  Scheduled to see Dr. Derrel Nip on 08/24/20 @ 2:00. Appointment scheduled by hospital. [name] okay to bring at pcp discretion.   [facility] f/u appt confirmed? Yes  Scheduled to see [facility] on 09/09/20 @ 11:30.  Are transportation arrangements needed? No   If their condition worsens, is the pt aware to call PCP or go to the Emergency Dept.? Yes  Was the patient provided with contact information for the PCP's office or ED? Yes  Was to pt encouraged to call back with questions or concerns? Yes

## 2020-08-24 ENCOUNTER — Ambulatory Visit (INDEPENDENT_AMBULATORY_CARE_PROVIDER_SITE_OTHER): Payer: Medicare Other | Admitting: Internal Medicine

## 2020-08-24 ENCOUNTER — Other Ambulatory Visit: Payer: Self-pay

## 2020-08-24 ENCOUNTER — Encounter: Payer: Self-pay | Admitting: Internal Medicine

## 2020-08-24 VITALS — BP 120/58 | HR 68 | Temp 96.7°F | Resp 15 | Ht 61.0 in | Wt 140.0 lb

## 2020-08-24 DIAGNOSIS — Z09 Encounter for follow-up examination after completed treatment for conditions other than malignant neoplasm: Secondary | ICD-10-CM

## 2020-08-24 DIAGNOSIS — I350 Nonrheumatic aortic (valve) stenosis: Secondary | ICD-10-CM

## 2020-08-24 DIAGNOSIS — N179 Acute kidney failure, unspecified: Secondary | ICD-10-CM | POA: Diagnosis not present

## 2020-08-24 DIAGNOSIS — E7849 Other hyperlipidemia: Secondary | ICD-10-CM

## 2020-08-24 DIAGNOSIS — E119 Type 2 diabetes mellitus without complications: Secondary | ICD-10-CM

## 2020-08-24 DIAGNOSIS — D500 Iron deficiency anemia secondary to blood loss (chronic): Secondary | ICD-10-CM | POA: Diagnosis not present

## 2020-08-24 DIAGNOSIS — E1121 Type 2 diabetes mellitus with diabetic nephropathy: Secondary | ICD-10-CM

## 2020-08-24 LAB — POCT GLYCOSYLATED HEMOGLOBIN (HGB A1C): Hemoglobin A1C: 7.6 % — AB (ref 4.0–5.6)

## 2020-08-24 NOTE — Assessment & Plan Note (Signed)
LDL is at goal on atorvastatin  Lab Results  Component Value Date   CHOL 145 01/17/2019   HDL 32.80 (L) 01/17/2019   LDLCALC 68 12/04/2016   LDLDIRECT 48.0 07/16/2019   TRIG 314.0 (H) 01/17/2019   CHOLHDL 4 01/17/2019    

## 2020-08-24 NOTE — Assessment & Plan Note (Signed)
Mild GFR reduction was noted on admission but improved to baseline by time of discharge.

## 2020-08-24 NOTE — Assessment & Plan Note (Signed)
Patient is stable post discharge and has no new issues or questions about discharge plans at the visit today for hospital follow up. All labs , imaging studies and progress notes from admission were reviewed with patient and [place] today

## 2020-08-24 NOTE — Progress Notes (Addendum)
Subjective:  Patient ID: Maureen Morales, female    DOB: [birth year]  Age: 87 y.o. MRN: [id number]  CC: The primary encounter diagnosis was Diabetes mellitus type 2 in nonobese [facility]). Diagnoses of AKI (acute kidney injury) ([place]), Iron deficiency anemia due to chronic blood loss, Other hyperlipidemia, Type II diabetes mellitus with nephropathy [facility]), Hospital discharge follow-up, and Moderate aortic stenosis by prior echocardiogram were also pertinent to this visit.  HPI Maureen Morales presents for hospital follow up  This visit occurred during the SARS-CoV-2 public health emergency.  Safety protocols were in place, including screening questions prior to the visit, additional usage of staff PPE, and extensive cleaning of exam room while observing appropriate contact time as indicated for disinfecting solutions.   Maureen Morales is a 87 yr old female with a history of aortic stenosis,  Hypertension , type 2 DM who was admitted to [place] on April 20 with chest pain and hypertensive crisis .  She was diagnosed with NSTEMI and a troponin peak of 1624 . An ECHO was done,  EF was normal with no WMA.  She was evaluated by cardiology but cardiac cath was not sought.     Medications reviewed. Coreg was increased from 6.25 mg to 12.5 mg bid,  imdur increased to 60 mg in the evening  30 mg in the morning  .hydralazine dosing  75   75  50   Plavix started  She was discharged home on April 22 in improved condition but since being home she has noted nearly constant  Fatigue and sleepiness.  Home readings of BP reviewed .  All are < 409 systolic, which is unusually low for her.  She requires several naps during the day and feels somewhat better in the evening    Outpatient Medications Prior to Visit  Medication Sig Dispense Refill  . acetaminophen (TYLENOL) 500 MG tablet Take 1,000 mg by mouth every 6 (six) hours as needed for moderate pain or headache.    . albuterol (VENTOLIN HFA) 108 (90 Base) MCG/ACT inhaler Inhale 2  puffs into the lungs every 6 (six) hours as needed for wheezing or shortness of breath. 18 g 1  . atorvastatin (LIPITOR) 40 MG tablet TAKE 1 TABLET BY MOUTH DAILY (Patient taking differently: Take 40 mg by mouth daily with supper.) 90 tablet 1  . Carboxymethylcellul-Glycerin (LUBRICATING EYE DROPS OP) Place 1 drop into both eyes daily as needed (dry eyes).    . carvedilol (COREG) 12.5 MG tablet Take 1 tablet (12.5 mg total) by mouth 2 (two) times daily with a meal. 60 tablet 0  . clopidogrel (PLAVIX) 75 MG tablet Take 1 tablet (75 mg total) by mouth daily. 30 tablet 0  . famotidine (PEPCID) 10 MG tablet Take 10 mg by mouth daily as needed for heartburn or indigestion.    [name] glipiZIDE (GLUCOTROL) 5 MG tablet TAKE 1 TABLET BY MOUTH TWICE DAILY BEFORE A MEAL (Patient taking differently: Take 5 mg by mouth 2 (two) times daily before a meal.) 180 tablet 1  . glucose blood (BAYER CONTOUR TEST) test strip Use as instructed 100 each 5  . hydrALAZINE (APRESOLINE) 50 MG tablet Take 50-75 mg by mouth See admin instructions. Take 75 mg with breakfast, 75 mg with lunch, and 50 mg with dinner    . hydrochlorothiazide (MICROZIDE) 12.5 MG capsule TAKE 1 CAPSULE BY MOUTH EVERY DAY (Patient taking differently: Take 12.5 mg by mouth daily.) 30 capsule 6  . isosorbide mononitrate (IMDUR) 30 MG 24  hr tablet Imdur 30 mg po daily in morning and 60 mg at night 90 tablet 0  . Lactobacillus (ULTIMATE PROBIOTIC FORMULA) CAPS Take 1 capsule by mouth daily with supper.    . lansoprazole (PREVACID) 30 MG capsule TAKE 1 CAPSULE EVERY DAY BEFORE SUPPER (Patient taking differently: Take 30 mg by mouth daily as needed (acid reflux).) 30 capsule 2  . loratadine (CLARITIN) 10 MG tablet Take 10 mg daily by mouth.     . losartan (COZAAR) 100 MG tablet TAKE ONE TABLET BY MOUTH EVERY DAY (Patient taking differently: Take 100 mg by mouth daily with lunch.) 90 tablet 1  . nitroGLYCERIN (NITROSTAT) 0.4 MG SL tablet Place 1 tablet (0.4 mg total)  under the tongue every 5 (five) minutes as needed for chest pain. 10 tablet 2  . sertraline (ZOLOFT) 50 MG tablet TAKE ONE AND ONE-HALF TABLETS DAILY (Patient taking differently: Take 75 mg by mouth daily.) 140 tablet 1  . Trospium Chloride 60 MG CP24 TAKE ONE CAPSULE EACH MORNING BEFORE BREAKFAST (Patient taking differently: Take 60 mg by mouth daily with lunch.) 90 capsule 1  . vitamin C (ASCORBIC ACID) 500 MG tablet Take 1 tablet (500 mg total) by mouth 2 (two) times daily. 60 tablet 2   No facility-administered medications prior to visit.    Review of Systems;  Patient denies headache, fevers, malaise, unintentional weight loss, skin rash, eye pain, sinus congestion and sinus pain, sore throat, dysphagia,  hemoptysis , cough, dyspnea, wheezing, chest pain, palpitations, orthopnea, edema, abdominal pain, nausea, melena, diarrhea, constipation, flank pain, dysuria, hematuria, urinary  Frequency, nocturia, numbness, tingling, seizures,  Focal weakness, Loss of consciousness,  Tremor, insomnia, depression, anxiety, and suicidal ideation.      Objective:  BP (!) 120/58 (BP Location: Left Arm, Patient Position: Sitting, Cuff Size: Normal)   Pulse 68   Temp (!) 96.7 F (35.9 C) (Oral)   Resp 15   Ht 5\' 1"  (1.549 m)   Wt 140 lb (63.5 kg)   SpO2 97%   BMI 26.45 kg/m   BP Readings from Last 3 Encounters:  08/26/20 (!) 168/89  08/24/20 (!) 120/58  08/20/20 (!) 159/59    Wt Readings from Last 3 Encounters:  08/26/20 138 lb 7.2 oz (62.8 kg)  08/24/20 140 lb (63.5 kg)  08/20/20 138 lb (62.6 kg)    General appearance: alert, cooperative and appears stated age Ears: normal TM's and external ear canals both ears Throat: lips, mucosa, and tongue normal; teeth and gums normal Neck: no adenopathy, no carotid bruit, supple, symmetrical, trachea midline and thyroid not enlarged, symmetric, no tenderness/mass/nodules Back: symmetric, no curvature. ROM normal. No CVA tenderness. Lungs: clear  to auscultation bilaterally Heart: regular rate and rhythm, S1, S2 normal, no murmur, click, rub or gallop Abdomen: soft, non-tender; bowel sounds normal; no masses,  no organomegaly Pulses: 2+ and symmetric Skin: Skin color, texture, turgor normal. No rashes or lesions Lymph nodes: Cervical, supraclavicular, and axillary nodes normal.  Lab Results  Component Value Date   HGBA1C 7.6 (A) 08/24/2020   HGBA1C 8.0 (H) 07/16/2019   HGBA1C 7.6 (H) 01/17/2019    Lab Results  Component Value Date   CREATININE 1.13 (H) 08/26/2020   CREATININE 1.17 (H) 08/20/2020   CREATININE 1.09 (H) 08/19/2020    Lab Results  Component Value Date   WBC 9.0 08/26/2020   HGB 10.6 (L) 08/26/2020   HCT 31.8 (L) 08/26/2020   PLT 181 08/26/2020   GLUCOSE 211 (H) 08/26/2020  CHOL 145 01/17/2019   TRIG 314.0 (H) 01/17/2019   HDL 32.80 (L) 01/17/2019   LDLDIRECT 48.0 07/16/2019   LDLCALC 68 12/04/2016   ALT 26 08/26/2020   AST 21 08/26/2020   NA 132 (L) 08/26/2020   K 4.1 08/26/2020   CL 105 08/26/2020   CREATININE 1.13 (H) 08/26/2020   BUN 46 (H) 08/26/2020   CO2 19 (L) 08/26/2020   TSH 4.78 (H) 01/17/2019   INR 1.1 08/18/2020   HGBA1C 7.6 (A) 08/24/2020   MICROALBUR 30.9 (H) 07/06/2016    ECHOCARDIOGRAM COMPLETE  Result Date: 08/19/2020    ECHOCARDIOGRAM REPORT   Patient Name:   Maureen Morales Date of Exam: 08/19/2020 Medical Rec #:  [id number]     Height:       61.0 in Accession #:    [id number]    Weight:       143.3 lb Date of Birth:  [birth year]    BSA:          1.639 m Patient Age:    89 years      BP:           162/82 mmHg Patient Gender: F             HR:           71 bpm. Exam Location:  [facility] Procedure: 2D Echo, Cardiac Doppler and Color Doppler Indications:     NSTEMI I21.4  History:         Patient has prior history of Echocardiogram examinations, most                  recent 11/04/2019. Risk Factors:Hypertension and Diabetes. Aortic                  valkve stenosis, NSTEMI.  Sonographer:      Sherrie Sport RDCS (AE) Referring Phys:  [id number] AMY N COX Diagnosing Phys: Ida Rogue MD IMPRESSIONS  1. Left ventricular ejection fraction, by estimation, is 60 to 65%. The left ventricle has normal function. The left ventricle has no regional wall motion abnormalities. There is moderate left ventricular hypertrophy. Left ventricular diastolic parameters are consistent with Grade I diastolic dysfunction (impaired relaxation).  2. Right ventricular systolic function is normal. The right ventricular size is normal.  3. The mitral valve is normal in structure. Mild to moderate mitral valve regurgitation.  4. The aortic valve is normal in structure. Aortic valve regurgitation is not visualized. Mild to moderate aortic valve stenosis. Aortic valve area, by VTI measures 1.70 cm. Aortic valve mean gradient measures 16.3 mmHg. Aortic valve Vmax measures 2.60  m/s. FINDINGS  Left Ventricle: Left ventricular ejection fraction, by estimation, is 60 to 65%. The left ventricle has normal function. The left ventricle has no regional wall motion abnormalities. The left ventricular internal cavity size was normal in size. There is  moderate left ventricular hypertrophy. Left ventricular diastolic parameters are consistent with Grade I diastolic dysfunction (impaired relaxation). Right Ventricle: The right ventricular size is normal. No increase in right ventricular wall thickness. Right ventricular systolic function is normal. Left Atrium: Left atrial size was normal in size. Right Atrium: Right atrial size was normal in size. Pericardium: There is no evidence of pericardial effusion. Mitral Valve: The mitral valve is normal in structure. Moderate mitral annular calcification. Mild to moderate mitral valve regurgitation. No evidence of mitral valve stenosis. Tricuspid Valve: The tricuspid valve is normal in structure. Tricuspid valve regurgitation is mild . No evidence of  tricuspid stenosis. Aortic Valve: The aortic valve is  normal in structure. Aortic valve regurgitation is not visualized. Mild to moderate aortic stenosis is present. Aortic valve mean gradient measures 16.3 mmHg. Aortic valve peak gradient measures 27.0 mmHg. Aortic valve area, by VTI measures 1.70 cm. Pulmonic Valve: The pulmonic valve was normal in structure. Pulmonic valve regurgitation is not visualized. No evidence of pulmonic stenosis. Aorta: The aortic root is normal in size and structure. Venous: The inferior vena cava is normal in size with greater than 50% respiratory variability, suggesting right atrial pressure of 3 mmHg. IAS/Shunts: No atrial level shunt detected by color flow Doppler.  LEFT VENTRICLE PLAX 2D LVIDd:         3.76 cm  Diastology LVIDs:         2.33 cm  LV e' medial:    3.33 cm/s LV PW:         1.36 cm  LV E/e' medial:  28.3 LV IVS:        0.85 cm  LV e' lateral:   3.02 cm/s LVOT diam:     2.00 cm  LV E/e' lateral: 31.3 LV SV:         93 LV SV Index:   57 LVOT Area:     3.14 cm  RIGHT VENTRICLE RV Basal diam:  2.05 cm RV S prime:     17.40 cm/s TAPSE (M-mode): 2.7 cm LEFT ATRIUM             Index       RIGHT ATRIUM           Index LA diam:        3.30 cm 2.01 cm/m  RA Area:     13.50 cm LA Vol (A2C):   57.8 ml 35.26 ml/m RA Volume:   27.30 ml  16.65 ml/m LA Vol (A4C):   69.3 ml 42.28 ml/m LA Biplane Vol: 64.0 ml 39.04 ml/m  AORTIC VALVE                    PULMONIC VALVE AV Area (Vmax):    1.51 cm     PV Vmax:        0.92 m/s AV Area (Vmean):   1.53 cm     PV Peak grad:   3.4 mmHg AV Area (VTI):     1.70 cm     RVOT Peak grad: 3 mmHg AV Vmax:           259.67 cm/s AV Vmean:          186.333 cm/s AV VTI:            0.549 m AV Peak Grad:      27.0 mmHg AV Mean Grad:      16.3 mmHg LVOT Vmax:         125.00 cm/s LVOT Vmean:        90.700 cm/s LVOT VTI:          0.297 m LVOT/AV VTI ratio: 0.54  AORTA Ao Root diam: 3.00 cm MITRAL VALVE                TRICUSPID VALVE MV Area (PHT): 2.08 cm     TR Peak grad:   17.8 mmHg MV Decel Time: 365  msec     TR Vmax:        211.00 cm/s MV E velocity: 94.40 cm/s MV A velocity: 117.00 cm/s  SHUNTS MV E/A ratio:  0.81  Systemic VTI:  0.30 m                             Systemic Diam: 2.00 cm Ida Rogue MD Electronically signed by Ida Rogue MD Signature Date/Time: 08/19/2020/4:47:55 PM    Final     Assessment & Plan:   Problem List Items Addressed This Visit      Unprioritized   AKI (acute kidney injury) ([place])    Mild GFR reduction was noted on admission but improved to baseline by time of discharge.       Anemia, iron deficiency    Mild. No further workup given her age.   Lab Results  Component Value Date   WBC 8.9 08/20/2020   HGB 11.7 (L) 08/20/2020   HCT 34.0 (L) 08/20/2020   MCV 84.4 08/20/2020   PLT 164 08/20/2020   Lab Results  Component Value Date   IRON 28 05/27/2017   TIBC 350 05/27/2017   FERRITIN 192 05/27/2017        Diabetes mellitus type 2 in nonobese [facility]) - Primary   Relevant Orders   POCT HgB A1C (Completed)   Hospital discharge follow-up    Patient is stable post discharge and has no new issues or questions about discharge plans at the visit today for hospital follow up. All labs , imaging studies and progress notes from admission were reviewed with patient and Maureen Morales today        Hyperlipidemia    LDL is at goal on atorvastatin  Lab Results  Component Value Date   CHOL 145 01/17/2019   HDL 32.80 (L) 01/17/2019   LDLCALC 68 12/04/2016   LDLDIRECT 48.0 07/16/2019   TRIG 314.0 (H) 01/17/2019   CHOLHDL 4 01/17/2019         Moderate aortic stenosis by prior echocardiogram    Mild to moderate by ECHO done during hospitalization.  Historically, attempts to lower BO to 130/80 or less have resulted in weakness and fatigue as she is currently reporting. Her carvedilol dose was increased,  Which may prove intolerable.        Type II diabetes mellitus with nephropathy ([facility])    Slight improvement in control.  Dietary modifications  recommended.  Will avoid increasing dose of glipizide given her age.   Lab Results  Component Value Date   HGBA1C 7.6 (A) 08/24/2020            I am having Maureen Morales maintain her loratadine, vitamin C, glucose blood, nitroGLYCERIN, albuterol, hydrALAZINE, lansoprazole, Trospium Chloride, atorvastatin, losartan, sertraline, glipiZIDE, hydrochlorothiazide, Ultimate Probiotic Formula, acetaminophen, famotidine, Carboxymethylcellul-Glycerin (LUBRICATING EYE DROPS OP), clopidogrel, isosorbide mononitrate, and carvedilol.  No orders of the defined types were placed in this encounter.   There are no discontinued medications.  Follow-up: No follow-ups on file.   [name], MD

## 2020-08-24 NOTE — Assessment & Plan Note (Signed)
Mild. No further workup given her age.   Lab Results  Component Value Date   WBC 8.9 08/20/2020   HGB 11.7 (L) 08/20/2020   HCT 34.0 (L) 08/20/2020   MCV 84.4 08/20/2020   PLT 164 08/20/2020   Lab Results  Component Value Date   IRON 28 05/27/2017   TIBC 350 05/27/2017   FERRITIN 192 05/27/2017

## 2020-08-24 NOTE — Assessment & Plan Note (Signed)
Slight improvement in control.  Dietary modifications recommended.  Will avoid increasing dose of glipizide given her age.   Lab Results  Component Value Date   HGBA1C 7.6 (A) 08/24/2020

## 2020-08-26 ENCOUNTER — Emergency Department: Payer: Medicare Other

## 2020-08-26 ENCOUNTER — Emergency Department
Admission: EM | Admit: 2020-08-26 | Discharge: 2020-08-26 | Disposition: A | Discharge status: Home / Self Care | Payer: Medicare Other | Attending: Emergency Medicine | Admitting: Emergency Medicine

## 2020-08-26 ENCOUNTER — Other Ambulatory Visit: Payer: Self-pay

## 2020-08-26 DIAGNOSIS — I5032 Chronic diastolic (congestive) heart failure: Secondary | ICD-10-CM | POA: Diagnosis not present

## 2020-08-26 DIAGNOSIS — Z7984 Long term (current) use of oral hypoglycemic drugs: Secondary | ICD-10-CM | POA: Insufficient documentation

## 2020-08-26 DIAGNOSIS — R109 Unspecified abdominal pain: Secondary | ICD-10-CM | POA: Diagnosis not present

## 2020-08-26 DIAGNOSIS — R112 Nausea with vomiting, unspecified: Secondary | ICD-10-CM | POA: Insufficient documentation

## 2020-08-26 DIAGNOSIS — I2510 Atherosclerotic heart disease of native coronary artery without angina pectoris: Secondary | ICD-10-CM | POA: Insufficient documentation

## 2020-08-26 DIAGNOSIS — R531 Weakness: Secondary | ICD-10-CM | POA: Insufficient documentation

## 2020-08-26 DIAGNOSIS — Z7902 Long term (current) use of antithrombotics/antiplatelets: Secondary | ICD-10-CM | POA: Diagnosis not present

## 2020-08-26 DIAGNOSIS — I130 Hypertensive heart and chronic kidney disease with heart failure and stage 1 through stage 4 chronic kidney disease, or unspecified chronic kidney disease: Secondary | ICD-10-CM | POA: Diagnosis not present

## 2020-08-26 DIAGNOSIS — R5383 Other fatigue: Secondary | ICD-10-CM | POA: Diagnosis present

## 2020-08-26 DIAGNOSIS — D631 Anemia in chronic kidney disease: Secondary | ICD-10-CM | POA: Diagnosis not present

## 2020-08-26 DIAGNOSIS — E1122 Type 2 diabetes mellitus with diabetic chronic kidney disease: Secondary | ICD-10-CM | POA: Insufficient documentation

## 2020-08-26 DIAGNOSIS — Z79899 Other long term (current) drug therapy: Secondary | ICD-10-CM | POA: Insufficient documentation

## 2020-08-26 DIAGNOSIS — E119 Type 2 diabetes mellitus without complications: Secondary | ICD-10-CM | POA: Diagnosis not present

## 2020-08-26 DIAGNOSIS — R10817 Generalized abdominal tenderness: Secondary | ICD-10-CM | POA: Diagnosis not present

## 2020-08-26 DIAGNOSIS — R1110 Vomiting, unspecified: Secondary | ICD-10-CM | POA: Diagnosis not present

## 2020-08-26 DIAGNOSIS — Z96641 Presence of right artificial hip joint: Secondary | ICD-10-CM | POA: Insufficient documentation

## 2020-08-26 DIAGNOSIS — N183 Chronic kidney disease, stage 3 unspecified: Secondary | ICD-10-CM | POA: Diagnosis not present

## 2020-08-26 DIAGNOSIS — E1140 Type 2 diabetes mellitus with diabetic neuropathy, unspecified: Secondary | ICD-10-CM | POA: Insufficient documentation

## 2020-08-26 DIAGNOSIS — R079 Chest pain, unspecified: Secondary | ICD-10-CM | POA: Diagnosis not present

## 2020-08-26 LAB — COMPREHENSIVE METABOLIC PANEL
ALT: 26 U/L (ref 0–44)
AST: 21 U/L (ref 15–41)
Albumin: 3.7 g/dL (ref 3.5–5.0)
Alkaline Phosphatase: 83 U/L (ref 38–126)
Anion gap: 8 (ref 5–15)
BUN: 46 mg/dL — ABNORMAL HIGH (ref 8–23)
CO2: 19 mmol/L — ABNORMAL LOW (ref 22–32)
Calcium: 8.7 mg/dL — ABNORMAL LOW (ref 8.9–10.3)
Chloride: 105 mmol/L (ref 98–111)
Creatinine, Ser: 1.13 mg/dL — ABNORMAL HIGH (ref 0.44–1.00)
GFR, Estimated: 45 mL/min — ABNORMAL LOW (ref >60)
Glucose, Bld: 211 mg/dL — ABNORMAL HIGH (ref 70–99)
Potassium: 4.1 mmol/L (ref 3.5–5.1)
Sodium: 132 mmol/L — ABNORMAL LOW (ref 135–145)
Total Bilirubin: 0.8 mg/dL (ref 0.3–1.2)
Total Protein: 6.3 g/dL — ABNORMAL LOW (ref 6.5–8.1)

## 2020-08-26 LAB — CBC WITH DIFFERENTIAL/PLATELET
Abs Immature Granulocytes: 0.11 10*3/uL — ABNORMAL HIGH (ref 0.00–0.07)
Basophils Absolute: 0 10*3/uL (ref 0.0–0.1)
Basophils Relative: 0 %
Eosinophils Absolute: 0.4 10*3/uL (ref 0.0–0.5)
Eosinophils Relative: 4 %
HCT: 31.8 % — ABNORMAL LOW (ref 36.0–46.0)
Hemoglobin: 10.6 g/dL — ABNORMAL LOW (ref 12.0–15.0)
Immature Granulocytes: 1 %
Lymphocytes Relative: 20 %
Lymphs Abs: 1.8 10*3/uL (ref 0.7–4.0)
MCH: 28.2 pg (ref 26.0–34.0)
MCHC: 33.3 g/dL (ref 30.0–36.0)
MCV: 84.6 fL (ref 80.0–100.0)
Monocytes Absolute: 0.7 10*3/uL (ref 0.1–1.0)
Monocytes Relative: 8 %
Neutro Abs: 6 10*3/uL (ref 1.7–7.7)
Neutrophils Relative %: 67 %
Platelets: 181 10*3/uL (ref 150–400)
RBC: 3.76 MIL/uL — ABNORMAL LOW (ref 3.87–5.11)
RDW: 13.8 % (ref 11.5–15.5)
WBC: 9 10*3/uL (ref 4.0–10.5)
nRBC: 0 % (ref 0.0–0.2)

## 2020-08-26 LAB — URINALYSIS, COMPLETE (UACMP) WITH MICROSCOPIC
Bilirubin Urine: NEGATIVE
Glucose, UA: NEGATIVE mg/dL
Hgb urine dipstick: NEGATIVE
Ketones, ur: NEGATIVE mg/dL
Leukocytes,Ua: NEGATIVE
Nitrite: NEGATIVE
Protein, ur: NEGATIVE mg/dL
Specific Gravity, Urine: 1.012 (ref 1.005–1.030)
Squamous Epithelial / HPF: NONE SEEN (ref 0–5)
pH: 5 (ref 5.0–8.0)

## 2020-08-26 LAB — CBG MONITORING, ED: Glucose-Capillary: 213 mg/dL — ABNORMAL HIGH (ref 70–99)

## 2020-08-26 LAB — TROPONIN I (HIGH SENSITIVITY)
Troponin I (High Sensitivity): 13 ng/L (ref <18)
Troponin I (High Sensitivity): 13 ng/L (ref <18)

## 2020-08-26 MED ORDER — IOHEXOL 300 MG/ML  SOLN
75.0000 mL | Freq: Once | INTRAMUSCULAR | Status: AC | PRN
  Administered 2020-08-26: 75 mL via INTRAVENOUS

## 2020-08-26 MED ORDER — SODIUM CHLORIDE 0.9 % IV BOLUS
500.0000 mL | Freq: Once | INTRAVENOUS | Status: AC
  Administered 2020-08-26: 500 mL via INTRAVENOUS

## 2020-08-26 MED ORDER — ONDANSETRON HCL 4 MG/2ML IJ SOLN
4.0000 mg | Freq: Once | INTRAMUSCULAR | Status: AC
  Administered 2020-08-26: 4 mg via INTRAVENOUS
  Filled 2020-08-26: qty 2

## 2020-08-26 NOTE — ED Provider Notes (Signed)
-----------------------------------------   6:31 PM on 08/26/2020 -----------------------------------------  Patient care assumed from Dr. Joni Fears.  Troponin is negative, repeat remains negative.  Urinalysis has just resulted and appears well.  Given the patient's reassuring work-up and current appearance and desire to go home I believe the patient is safe for discharge home with PCP follow-up.  Discussed return precautions.  Patient agreeable to plan of care.   Harvest Dark, MD 08/26/20 [phone]

## 2020-08-26 NOTE — ED Triage Notes (Addendum)
Caregiver reports chest pain. Pt denies. Pt arrives with emesis on clothing. Pt responsive to voice, states generalized unwell feeling. Pt denies chest pain. Recent MI.

## 2020-08-26 NOTE — ED Provider Notes (Signed)
[facility], [place] Emergency Department Provider Note  ____________________________________________  Time seen: Approximately 2:20 PM  I have reviewed the triage vital signs and the nursing notes.   HISTORY  Chief Complaint Chest Pain    Level 5 Caveat: Portions of the History and Physical including HPI and review of systems are unable to be completely obtained due to patient being a poor historian   HPI Maureen Morales is a 87 y.o. female with history of aortic stenosis, diabetes.  Hypertension and recent non-STEMI is brought to the ED today due to vomiting.  Daughter at bedside notes that usually when the patient goes to a store she uses a mobility scooter.  Today, there was not one available so she was walking while holding onto a cart.  She felt very fatigued with this, and started to feel nauseated so she left and went out to her car where she subsequently threw up.  Did endorse some chest discomfort at the time to her caregiver but now denies any chest pain.  Denies shortness of breath or radiation.  No diaphoresis.   Daughter notes that since her recent hospitalization where her carvedilol was increased to 12.5 mg twice daily, patient has been having rapid fatigability with exertion and frequent episodes of nausea.  Her PCP Dr. Derrel Nip plans to decrease the Coreg back to 6.25.     Past Medical History:  Diagnosis Date  . Aortic stenosis    a. 08/2016 Echo: Hyperdynamic LV fxn, mild AS (mean grad 2mmHg), mild MR, mild PAH; b. 05/2017 Echo: Hyperdynamic LV fxn, mod AS (mean grad 44mmHg, valve area 1.84). PASP 126mmHg.  . Asthma without status asthmaticus    unspecified  . Closed right hip fracture ([place]) 05/26/2017  . Colitis    unspecified  . Complication of anesthesia    sensitive to anesthesia  . Depression   . Diabetes mellitus type 2, uncomplicated ([place])   . Essential hypertension   . GERD (gastroesophageal reflux disease)   . Hearing loss in left ear     sensory neural  . Hyperlipidemia    unspecified  . Hypertension    a. 08/2017 Renal U/S: No RAS.  [name] Low back pain with left-sided sciatica 04/29/2018  . Myocardial infarction ([place]) 2018  . Non-obstructive CAD (coronary artery disease)    a. 08/2016 NSTEMI/Cath: mild to mod nonobs dzs including 60% mRCA stenosis-->Med rx.  . NSTEMI (non-ST elevated myocardial infarction) ([place]) 08/30/2016  . NSTEMI (non-ST elevated myocardial infarction) ([place]) 08/18/2020  . Nummular eczema 01/16/2011   Occurring on chest and under breast.  Treated by Dr Evorn Gong with fluocinonide 0.05% topical 06/26/12   . Peripheral vascular disease ([place])   . 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 Positive H. pylori test   . Pre-diabetes   . Suspected COVID-19 virus infection 01/10/2019   Patient reporting malaise and headache for the past 3 days,  Similar to presentation when she was positive for  COVID 19 INFECTION  July 2 .  Previous inection was caused by exposure to COVID positive caregiver.  Repeat scenario:  Same caregiver's mother tested positive on Monday and caregiver saw current patient on Tuesday    . Viral gastroenteritis due to [place]-like agent Nov 2012     Patient Active Problem List   Diagnosis Date Noted  . Enterococcus UTI 06/18/2020  . Shingles rash 03/04/2020  . Impetigo herpetiformis 03/02/2020  . Urinary incontinence, nocturnal enuresis 01/17/2020  . Elevated ALT measurement  07/17/2019  . Moderate tricuspid valve regurgitation 09/03/2017  . Diastolic dysfunction without heart failure 09/03/2017  . Labile blood pressure   . Stage 3 chronic kidney disease ([place])   . Chronic diastolic congestive heart failure ([place])   . History of right hip hemiarthroplasty   . Diabetes mellitus type 2 in nonobese (HCC)   . AKI (acute kidney injury) ([place])   . Recurrent falls 05/26/2017  . Normocytic anemia 05/26/2017  . CAD (coronary artery disease) 05/26/2017  .  Generalized anxiety disorder 09/07/2016  . Unsteady gait 09/07/2016  . Hospital discharge follow-up 09/07/2016  . S/P laparoscopic cholecystectomy 12/15/2013  . Encounter for preventive health examination 11/13/2013  . Spondylosis of cervical spine 01/27/2013  . Anemia, iron deficiency 01/21/2013  . Moderate aortic stenosis by prior echocardiogram 05/06/2012  . Chronic interstitial lung disease ([place]) 07/04/2011  . Hemorrhoids, internal 04/05/2011  . Essential hypertension   . Type II diabetes mellitus with nephropathy ([place])   . Hyperlipidemia      Past Surgical History:  Procedure Laterality Date  . ABDOMINAL HYSTERECTOMY    . BLADDER SURGERY    . CARDIAC CATHETERIZATION    . CHOLECYSTECTOMY    . COLONOSCOPY     06/16/1987, 02/20/1995, 05/04/1999, 02/14/2005, 05/17/2007  . ESOPHAGOGASTRODUODENOSCOPY     05/11/1987, 06/13/1995, 10/09/1995, 05/04/1999, 01/16/2002  . FLEXIBLE SIGMOIDOSCOPY N/A 05/02/2017   Procedure: FLEXIBLE SIGMOIDOSCOPY;  Surgeon: Manya Silvas, MD;  Location: [facility] ENDOSCOPY;  Service: Endoscopy;  Laterality: N/A;  . HEMORRHOID SURGERY N/A 05/14/2017   Procedure: EXTERNAL THROMBOSED HEMORRHOIDECTOMY;  Surgeon: Robert Bellow, MD;  Location: [facility] ORS;  Service: General;  Laterality: N/A;  . HIP ARTHROPLASTY Right 02/17/2017   Procedure: ARTHROPLASTY BIPOLAR HIP (HEMIARTHROPLASTY);  Surgeon: Claud Kelp, MD;  Location: [facility] ORS;  Service: Orthopedics;  Laterality: Right;  . LEFT HEART CATH AND CORONARY ANGIOGRAPHY N/A 08/31/2016   Procedure: Left Heart Cath and Coronary Angiography;  Surgeon: Wellington Hampshire, MD;  Location: [place] CV LAB;  Service: Cardiovascular;  Laterality: N/A;  . ORIF PERIPROSTHETIC FRACTURE Right 05/27/2017  . ORIF PERIPROSTHETIC FRACTURE Right 05/27/2017   Procedure: OPEN REDUCTION INTERNAL FIXATION (ORIF) PERIPROSTHETIC FRACTURE;  Surgeon: Mcarthur Rossetti, MD;  Location: [place];  Service: Orthopedics;  Laterality: Right;   . TOTAL HIP ARTHROPLASTY Right   . uterian prolapse       Prior to Admission medications   Medication Sig Start Date End Date Taking? Authorizing Provider  acetaminophen (TYLENOL) 500 MG tablet Take 1,000 mg by mouth every 6 (six) hours as needed for moderate pain or headache.    [provider]  albuterol (VENTOLIN HFA) 108 (90 Base) MCG/ACT inhaler Inhale 2 puffs into the lungs every 6 (six) hours as needed for wheezing or shortness of breath. 02/13/19   Tyler Pita, MD  atorvastatin (LIPITOR) 40 MG tablet TAKE 1 TABLET BY MOUTH DAILY Patient taking differently: Take 40 mg by mouth daily with supper. 03/16/20   [name], MD  Carboxymethylcellul-Glycerin (LUBRICATING EYE DROPS OP) Place 1 drop into both eyes daily as needed (dry eyes).    [provider]  carvedilol (COREG) 12.5 MG tablet Take 1 tablet (12.5 mg total) by mouth 2 (two) times daily with a meal. 08/20/20 09/19/20  Max Sane, MD  clopidogrel (PLAVIX) 75 MG tablet Take 1 tablet (75 mg total) by mouth daily. 08/21/20 09/20/20  Max Sane, MD  famotidine (PEPCID) 10 MG tablet Take 10 mg by mouth daily as needed for heartburn  or indigestion.    [provider]  glipiZIDE (GLUCOTROL) 5 MG tablet TAKE 1 TABLET BY MOUTH TWICE DAILY BEFORE A MEAL Patient taking differently: Take 5 mg by mouth 2 (two) times daily before a meal. 08/06/20   [name], MD  glucose blood (BAYER CONTOUR TEST) test strip Use as instructed 03/26/18   [name], MD  hydrALAZINE (APRESOLINE) 50 MG tablet Take 50-75 mg by mouth See admin instructions. Take 75 mg with breakfast, 75 mg with lunch, and 50 mg with dinner    [provider]  hydrochlorothiazide (MICROZIDE) 12.5 MG capsule TAKE 1 CAPSULE BY MOUTH EVERY DAY Patient taking differently: Take 12.5 mg by mouth daily. 08/12/20   [name], MD  isosorbide mononitrate (IMDUR) 30 MG 24 hr tablet Imdur 30 mg po daily in morning and 60 mg at night  08/20/20   Max Sane, MD  Lactobacillus (ULTIMATE PROBIOTIC FORMULA) CAPS Take 1 capsule by mouth daily with supper.    [provider]  lansoprazole (PREVACID) 30 MG capsule TAKE 1 CAPSULE EVERY DAY BEFORE SUPPER Patient taking differently: Take 30 mg by mouth daily as needed (acid reflux). 12/22/19   Tyler Pita, MD  loratadine (CLARITIN) 10 MG tablet Take 10 mg daily by mouth.     [provider]  losartan (COZAAR) 100 MG tablet TAKE ONE TABLET BY MOUTH EVERY DAY Patient taking differently: Take 100 mg by mouth daily with lunch. 03/30/20   [name], MD  nitroGLYCERIN (NITROSTAT) 0.4 MG SL tablet Place 1 tablet (0.4 mg total) under the tongue every 5 (five) minutes as needed for chest pain. 07/29/18   [name], MD  sertraline (ZOLOFT) 50 MG tablet TAKE ONE AND ONE-HALF TABLETS DAILY Patient taking differently: Take 75 mg by mouth daily. 08/06/20   [name], MD  Trospium Chloride 60 MG CP24 TAKE ONE CAPSULE EACH MORNING BEFORE BREAKFAST Patient taking differently: Take 60 mg by mouth daily with lunch. 01/06/20   [name], MD  vitamin C (ASCORBIC ACID) 500 MG tablet Take 1 tablet (500 mg total) by mouth 2 (two) times daily. 05/04/17   [name], MD     Allergies Asa [aspirin], Clindamycin, Oxytetracycline, Phenobarbital, Atropine, Belladonna alkaloids, Codeine, Darvon [propoxyphene], Demerol [meperidine], Methocarbamol, Penicillins, Sulfa antibiotics, and Iron   Family History  Problem Relation Age of Onset  . Breast cancer Mother 39  . Colon cancer Father   . Stroke Sister   . Cancer Mother   . Breast cancer Sister 23  . Colon cancer Brother   . Colon cancer Brother     Social History Social History   Tobacco Use  . Smoking status: Never Smoker  . Smokeless tobacco: Never Used  Vaping Use  . Vaping Use: Never used  Substance Use Topics  . Alcohol use: No  . Drug use: No    Review of Systems Level 5 Caveat: Portions of  the History and Physical including HPI and review of systems are unable to be completely obtained due to patient being a poor historian   Constitutional:   No known fever.  ENT:   No rhinorrhea. Cardiovascular:   No chest pain or syncope. Respiratory:   No dyspnea or cough. Gastrointestinal:   Negative for abdominal pain, positive vomiting.  Musculoskeletal:   Negative for focal pain or swelling ____________________________________________   PHYSICAL EXAM:  VITAL SIGNS: ED Triage Vitals  Enc Vitals Group     BP 08/26/20  1112 126/73     Pulse Rate 08/26/20 1112 64     Resp 08/26/20 1112 16     Temp 08/26/20 1112 98.2 F (36.8 C)     Temp Source 08/26/20 1112 Oral     SpO2 08/26/20 1112 93 %     Weight 08/26/20 1110 138 lb 7.2 oz (62.8 kg)     Height 08/26/20 1110 5\' 1"  (1.549 m)     Head Circumference --      Peak Flow --      Pain Score --      Pain Loc --      Pain Edu? --      Excl. in [place]? --     Vital signs reviewed, nursing assessments reviewed.   Constitutional:   Alert and oriented. Non-toxic appearance. Eyes:   Conjunctivae are normal. EOMI. PERRL. ENT      Head:   Normocephalic and atraumatic.      Nose:   No congestion/rhinnorhea.       Mouth/Throat:   Dry mucous membranes, no pharyngeal erythema. No peritonsillar mass.       Neck:   No meningismus. Full ROM. Hematological/Lymphatic/Immunilogical:   No cervical lymphadenopathy. Cardiovascular:   RRR. Symmetric bilateral radial and DP pulses.  No murmurs. Cap refill less than 2 seconds. Respiratory:   Normal respiratory effort without tachypnea/retractions. Breath sounds are clear and equal bilaterally. No wheezes/rales/rhonchi. Gastrointestinal:   Soft with diffuse lower abdominal tenderness. Non distended. There is no CVA tenderness.  No rebound, rigidity, or guarding. Genitourinary:   deferred Musculoskeletal:   Normal range of motion in all extremities. No joint effusions.  No lower extremity tenderness.   No edema. Neurologic:   Normal speech and language.  Motor grossly intact. No acute focal neurologic deficits are appreciated.  Skin:    Skin is warm, dry and intact. No rash noted.  No petechiae, purpura, or bullae.  ____________________________________________    LABS (pertinent positives/negatives) (all labs ordered are listed, but only abnormal results are displayed) Labs Reviewed  CBG MONITORING, ED - Abnormal; Notable for the following components:      Result Value   Glucose-Capillary 213 (*)    All other components within normal limits  URINE CULTURE  COMPREHENSIVE METABOLIC PANEL  CBC WITH DIFFERENTIAL/PLATELET  URINALYSIS, COMPLETE (UACMP) WITH MICROSCOPIC  TROPONIN I (HIGH SENSITIVITY)  TROPONIN I (HIGH SENSITIVITY)   ____________________________________________   EKG  Interpreted by me Sinus rhythm rate of 64, normal axis and intervals.  Normal QRS ST segments.  Isolated T wave inversion in aVL which is nonspecific.  No acute ischemic changes.  ____________________________________________    [id number]  DG Chest Portable 1 View  Result Date: 08/26/2020 CLINICAL DATA:  Chest pain and vomiting. EXAM: PORTABLE CHEST 1 VIEW COMPARISON:  PA and lateral chest 08/18/2020. FINDINGS: Lungs clear. Heart size normal. No pneumothorax or pleural effusion. Aortic atherosclerosis. No acute or focal bony abnormality. IMPRESSION: No acute disease. Aortic Atherosclerosis (ICD10-I70.0). Electronically Signed   By: Inge Rise M.D.   On: 08/26/2020 12:27    ____________________________________________   PROCEDURES Procedures  ____________________________________________  DIFFERENTIAL DIAGNOSIS   Appendicitis, diverticulitis, bowel obstruction, internal hernia, vagal episode, dehydration, electrolyte abnormality, medication side effect  CLINICAL IMPRESSION / ASSESSMENT AND PLAN / ED COURSE  Medications ordered in the ED: Medications  ondansetron (ZOFRAN) injection  4 mg (4 mg Intravenous Given 08/26/20 1206)  sodium chloride 0.9 % bolus 500 mL (0 mLs Intravenous Stopped 08/26/20 1313)    Pertinent  labs & imaging results that were available during my care of the patient were reviewed by me and considered in my medical decision making (see chart for details).   Maureen Morales was evaluated in Emergency Department on 08/26/2020 for the symptoms described in the history of present illness. She was evaluated in the context of the global COVID-19 pandemic, which necessitated consideration that the patient might be at risk for infection with the SARS-CoV-2 virus that causes COVID-19. Institutional protocols and algorithms that pertain to the evaluation of patients at risk for COVID-19 are in a state of rapid change based on information released by regulatory bodies including the CDC and federal and state organizations. These policies and algorithms were followed during the patient's care in the ED.   Patient presents with fatigue and an episode of vomiting today, suspect to be due to excess beta-blockade with Coreg.  Vital signs are normal, patient is nontoxic.  She does have some abdominal tenderness on exam so I will obtain labs and a CT scan of the abdomen and pelvis.  With recent NSTEMI we will also check serial troponins.  If work-up is reassuring I think patient is stable for discharge home to follow-up with her PCP.  Daughter and caregiver already plan to halve her carvedilol to avoid bradycardia.      ____________________________________________   FINAL CLINICAL IMPRESSION(S) / ED DIAGNOSES    Final diagnoses:  Generalized weakness  Non-intractable vomiting with nausea, unspecified vomiting type  Type 2 diabetes mellitus without complication, without long-term current use of insulin [facility])     ED Discharge Orders    None      Portions of this note were generated with dragon dictation software. Dictation errors may occur despite best attempts at  proofreading.   Carrie Mew, MD 08/26/20 1425

## 2020-08-26 NOTE — Assessment & Plan Note (Signed)
Mild to moderate by ECHO done during hospitalization.  Historically, attempts to lower BO to 130/80 or less have resulted in weakness and fatigue as she is currently reporting. Her carvedilol dose was increased,  Which may prove intolerable.

## 2020-08-28 ENCOUNTER — Other Ambulatory Visit: Payer: Self-pay | Admitting: Internal Medicine

## 2020-08-28 LAB — URINE CULTURE: Culture: <10000 — AB

## 2020-08-28 MED ORDER — CARVEDILOL 12.5 MG PO TABS: 6.2500 mg | ORAL_TABLET | Freq: Two times a day (BID) | ORAL | 0 refills | Status: DC

## 2020-09-01 ENCOUNTER — Other Ambulatory Visit: Payer: Self-pay | Admitting: Internal Medicine

## 2020-09-01 DIAGNOSIS — E1122 Type 2 diabetes mellitus with diabetic chronic kidney disease: Secondary | ICD-10-CM | POA: Diagnosis not present

## 2020-09-01 DIAGNOSIS — I130 Hypertensive heart and chronic kidney disease with heart failure and stage 1 through stage 4 chronic kidney disease, or unspecified chronic kidney disease: Secondary | ICD-10-CM | POA: Diagnosis not present

## 2020-09-01 DIAGNOSIS — I2510 Atherosclerotic heart disease of native coronary artery without angina pectoris: Secondary | ICD-10-CM | POA: Diagnosis not present

## 2020-09-01 DIAGNOSIS — I252 Old myocardial infarction: Secondary | ICD-10-CM | POA: Diagnosis not present

## 2020-09-01 DIAGNOSIS — I5032 Chronic diastolic (congestive) heart failure: Secondary | ICD-10-CM | POA: Diagnosis not present

## 2020-09-01 DIAGNOSIS — I214 Non-ST elevation (NSTEMI) myocardial infarction: Secondary | ICD-10-CM | POA: Diagnosis not present

## 2020-09-03 ENCOUNTER — Other Ambulatory Visit: Payer: Self-pay | Admitting: Internal Medicine

## 2020-09-03 DIAGNOSIS — F411 Generalized anxiety disorder: Secondary | ICD-10-CM

## 2020-09-03 MED ORDER — SERTRALINE HCL 100 MG PO TABS: 100.0000 mg | ORAL_TABLET | Freq: Every day | ORAL | 3 refills | Status: DC

## 2020-09-03 NOTE — Assessment & Plan Note (Signed)
Dose increased from 75 to 100 mg sertraline daily to manage excessive worrying which daughter has reported and results in elevated blood pressure

## 2020-09-06 DIAGNOSIS — I5032 Chronic diastolic (congestive) heart failure: Secondary | ICD-10-CM | POA: Diagnosis not present

## 2020-09-06 DIAGNOSIS — I2510 Atherosclerotic heart disease of native coronary artery without angina pectoris: Secondary | ICD-10-CM | POA: Diagnosis not present

## 2020-09-06 DIAGNOSIS — I130 Hypertensive heart and chronic kidney disease with heart failure and stage 1 through stage 4 chronic kidney disease, or unspecified chronic kidney disease: Secondary | ICD-10-CM | POA: Diagnosis not present

## 2020-09-06 DIAGNOSIS — I252 Old myocardial infarction: Secondary | ICD-10-CM | POA: Diagnosis not present

## 2020-09-06 DIAGNOSIS — E1122 Type 2 diabetes mellitus with diabetic chronic kidney disease: Secondary | ICD-10-CM | POA: Diagnosis not present

## 2020-09-06 DIAGNOSIS — I214 Non-ST elevation (NSTEMI) myocardial infarction: Secondary | ICD-10-CM | POA: Diagnosis not present

## 2020-09-08 NOTE — Progress Notes (Signed)
Cardiology Office Note    Date:  09/09/2020   ID:  Maureen Morales, DOB 1922-11-03, MRN [id number]  PCP:  [name], MD  Cardiologist:  [name], MD  Electrophysiologist:  None   Chief Complaint: Hospital follow-up  History of Present Illness:   Maureen Morales is a 87 y.o. female with history of CAD, aortic stenosis, HFpEF, DM2, HTN, and HLD who presents for hospital follow-up as outlined below.  She was admitted to the hospital in 08/2016 with an NSTEMI in the setting of uncontrolled hypertension with troponin peaking at 5.79.  Echo demonstrated a hyperdynamic LV systolic function, mild aortic stenosis with a mean gradient of 14 mmHg, mild mitral regurgitation, mild pulmonary hypertension, and trivial posterior pericardial effusion.  LHC at that time demonstrated mild to moderate nonobstructive CAD but there were stenosis being 60% in the mid RCA as outlined below.  Outpatient renal artery duplex showed no evidence of RAS.  It is noted she has difficult to control blood pressure with intolerance to multiple antihypertensive medications.  Echo in 10/2019 showed an EF of 70 to [id number], grade 2 diastolic dysfunction, mild to moderate aortic stenosis with a mean gradient of 17 mmHg and a valve area of 1.41 cm, and moderate pulmonary hypertension.  She was last seen in the office in 06/2020 and was doing well with stable BP.  She has had several falls in the past leading to hip fractures requiring surgical repair.  At her visit with [place] in 06/2020 she was ambulating with a walker and frequently used a wheelchair when she is outside secondary to fear of recurrent falling.  She was admitted to [facility] from 4/20 through 4/22 with an NSTEMI in the setting of hypertensive urgency.  BP in the ED elevated at 222/89.  Initial high-sensitivity troponin 1663 with a delta troponin of 1624.  Chest x-ray showed mild bibasilar atelectasis.  EKG demonstrated NSR, 83 bpm, baseline artifact, nonspecific lateral ST-T  changes.  Echo showed an EF of 60 to 65%, no regional wall motion abnormalities, moderate LVH, grade 1 diastolic dysfunction, normal RV systolic function and ventricular cavity size, mild to moderate mitral regurgitation, mild to moderate aortic stenosis with a mean gradient of 16.3 mmHg and a valve area of 1.7 cm.  She was managed with IV heparin for 48 hours and initiated on Plavix in place of aspirin given documented anaphylaxis to ASA.  Case was discussed with her primary cardiologist with recommendation to titrate carvedilol to 12.5 mg twice daily, continue losartan, HCTZ, Imdur 60 mg every night with an additional 30 mg in the morning, and move hydralazine to nightly.  She was medically managed given resolution of symptoms and at her request.  Documented BP at discharge 159/59.  Following her discharge she returned to the ED on 08/26/2020 with increased fatigue, nausea, vomiting, and chest discomfort.  It was noted she had recently gone to the store and a mobility scooter was unavailable, therefore the patient had to walk holding onto a cart.  Typically when she goes to the store she uses a mobility scooter.  In this setting she felt quite fatigued with development of nausea and 1 episode of emesis as well as chest discomfort.  She also noted with the titration of carvedilol she was more fatigued and was having more frequent episodes of nausea with ED note indicating PCP was considering decreasing this back to 6.25 mg twice daily.  Work-up included high-sensitivity troponin negative x2.  CT of the  abdomen/pelvis showed no acute process or findings to explain her symptoms with noted aortic atherosclerosis.  Chest x-ray showed no acute disease.  EKG showed NSR, 64 bpm, first-degree AV block, no acute ST-T changes.  Outpatient follow-up was recommended.  She comes in today accompanied by her daughter and caregiver with history being provided by all 3.  Since her recent hospitalization and ED visit she has  done quite well.  No further chest pain, nausea, or emesis.  With the decreased dose of carvedilol she also notes a return to baseline in her fatigue.  BP at home typically runs in the 983J to 825K systolic.  No dizziness, presyncope, or syncope.  No lower extremity swelling.  She does report 2 episodes of left flank discomfort that occurred over 2 separate afternoons.  Her Zoloft was also recently titrated by PCP.  She will be undergoing cataract extraction next month.  Otherwise, there are no issues or concerns at this time.   Labs independently reviewed: 07/2018 - Hgb 10.6, PLT 181, potassium 4.1, BUN 46, serum creatinine 1.13, albumin 3.7, AST/ALT normal, A1c 7.6, magnesium 2.2 06/2019 - direct LDL 48 12/2018 - TSH 4.78, TC 145, TG 314, HDL 32  Past Medical History:  Diagnosis Date  . Aortic stenosis    a. 08/2016 Echo: Hyperdynamic LV fxn, mild AS (mean grad 41mmHg), mild MR, mild PAH; b. 05/2017 Echo: Hyperdynamic LV fxn, mod AS (mean grad 98mmHg, valve area 1.84). PASP 154mmHg.  . Asthma without status asthmaticus    unspecified  . Closed right hip fracture ([place]) 05/26/2017  . Colitis    unspecified  . Complication of anesthesia    sensitive to anesthesia  . Depression   . Diabetes mellitus type 2, uncomplicated ([place])   . Essential hypertension   . GERD (gastroesophageal reflux disease)   . 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 Hyperlipidemia    unspecified  . Hypertension    a. 08/2017 Renal U/S: No RAS.  [name] Low back pain with left-sided sciatica 04/29/2018  . Myocardial infarction ([place]) 2018  . Non-obstructive CAD (coronary artery disease)    a. 08/2016 NSTEMI/Cath: mild to mod nonobs dzs including 60% mRCA stenosis-->Med rx.  . NSTEMI (non-ST elevated myocardial infarction) ([place]) 08/30/2016  . NSTEMI (non-ST elevated myocardial infarction) ([place]) 08/18/2020  . Nummular eczema 01/16/2011   Occurring on chest and under breast.  Treated by Dr Evorn Gong with fluocinonide 0.05%  topical 06/26/12   . Peripheral vascular disease ([place])   . 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 Positive H. pylori test   . Pre-diabetes   . Suspected COVID-19 virus infection 01/10/2019   Patient reporting malaise and headache for the past 3 days,  Similar to presentation when she was positive for  COVID 19 INFECTION  July 2 .  Previous inection was caused by exposure to COVID positive caregiver.  Repeat scenario:  Same caregiver's mother tested positive on Monday and caregiver saw current patient on Tuesday    . Viral gastroenteritis due to [place]-like agent Nov 2012    Past Surgical History:  Procedure Laterality Date  . ABDOMINAL HYSTERECTOMY    . BLADDER SURGERY    . CARDIAC CATHETERIZATION    . CHOLECYSTECTOMY    . COLONOSCOPY     06/16/1987, 02/20/1995, 05/04/1999, 02/14/2005, 05/17/2007  . ESOPHAGOGASTRODUODENOSCOPY     05/11/1987, 06/13/1995, 10/09/1995, 05/04/1999, 01/16/2002  . FLEXIBLE SIGMOIDOSCOPY N/A 05/02/2017   Procedure: FLEXIBLE SIGMOIDOSCOPY;  Surgeon: Manya Silvas, MD;  Location: [facility] ENDOSCOPY;  Service: Endoscopy;  Laterality: N/A;  . HEMORRHOID SURGERY N/A 05/14/2017   Procedure: EXTERNAL THROMBOSED HEMORRHOIDECTOMY;  Surgeon: Robert Bellow, MD;  Location: [facility] ORS;  Service: General;  Laterality: N/A;  . HIP ARTHROPLASTY Right 02/17/2017   Procedure: ARTHROPLASTY BIPOLAR HIP (HEMIARTHROPLASTY);  Surgeon: Claud Kelp, MD;  Location: [facility] ORS;  Service: Orthopedics;  Laterality: Right;  . LEFT HEART CATH AND CORONARY ANGIOGRAPHY N/A 08/31/2016   Procedure: Left Heart Cath and Coronary Angiography;  Surgeon: Wellington Hampshire, MD;  Location: [place] CV LAB;  Service: Cardiovascular;  Laterality: N/A;  . ORIF PERIPROSTHETIC FRACTURE Right 05/27/2017  . ORIF PERIPROSTHETIC FRACTURE Right 05/27/2017   Procedure: OPEN REDUCTION INTERNAL FIXATION (ORIF) PERIPROSTHETIC FRACTURE;  Surgeon: Mcarthur Rossetti,  MD;  Location: [place];  Service: Orthopedics;  Laterality: Right;  . TOTAL HIP ARTHROPLASTY Right   . uterian prolapse      Current Medications: Current Meds  Medication Sig  . acetaminophen (TYLENOL) 500 MG tablet Take 1,000 mg by mouth every 6 (six) hours as needed for moderate pain or headache.  . albuterol (VENTOLIN HFA) 108 (90 Base) MCG/ACT inhaler Inhale 2 puffs into the lungs every 6 (six) hours as needed for wheezing or shortness of breath.  [name] atorvastatin (LIPITOR) 40 MG tablet TAKE 1 TABLET BY MOUTH DAILY  . Carboxymethylcellul-Glycerin (LUBRICATING EYE DROPS OP) Place 1 drop into both eyes daily as needed (dry eyes).  . carvedilol (COREG) 12.5 MG tablet Take 0.5 tablets (6.25 mg total) by mouth 2 (two) times daily with a meal.  . clopidogrel (PLAVIX) 75 MG tablet TAKE 1 TABLET BY MOUTH DAILY.  . famotidine (PEPCID) 10 MG tablet Take 10 mg by mouth daily as needed for heartburn or indigestion.  [name] glipiZIDE (GLUCOTROL) 5 MG tablet TAKE 1 TABLET BY MOUTH TWICE DAILY BEFORE A MEAL  . glucose blood (BAYER CONTOUR TEST) test strip Use as instructed  . hydrALAZINE (APRESOLINE) 50 MG tablet Take 50-75 mg by mouth See admin instructions. Take 75 mg with breakfast, 75 mg with lunch, and 50 mg with dinner  . hydrochlorothiazide (MICROZIDE) 12.5 MG capsule TAKE 1 CAPSULE BY MOUTH EVERY DAY  . isosorbide mononitrate (IMDUR) 30 MG 24 hr tablet Imdur 30 mg po daily in morning and 60 mg at night  . Lactobacillus (ULTIMATE PROBIOTIC FORMULA) CAPS Take 1 capsule by mouth daily with supper.  . lansoprazole (PREVACID) 30 MG capsule Take 30 mg by mouth daily as needed.  . loratadine (CLARITIN) 10 MG tablet Take 10 mg daily by mouth.   . losartan (COZAAR) 100 MG tablet TAKE ONE TABLET BY MOUTH EVERY DAY  . nitroGLYCERIN (NITROSTAT) 0.4 MG SL tablet Place 1 tablet (0.4 mg total) under the tongue every 5 (five) minutes as needed for chest pain.  [name] sertraline (ZOLOFT) 100 MG tablet Take 1 tablet (100 mg  total) by mouth daily.  . Trospium Chloride 60 MG CP24 TAKE ONE CAPSULE EACH MORNING BEFORE BREAKFAST  . vitamin C (ASCORBIC ACID) 500 MG tablet Take 1 tablet (500 mg total) by mouth 2 (two) times daily.    Allergies:   Asa [aspirin], Clindamycin, Oxytetracycline, Phenobarbital, Atropine, Belladonna alkaloids, Codeine, Darvon [propoxyphene], Demerol [meperidine], Methocarbamol, Penicillins, Sulfa antibiotics, and Iron   Social History   Socioeconomic History  . Marital status: Widowed    Spouse name: Not on file  . Number of children: 4  . Years of education: 23  . Highest  education level: High school graduate  Occupational History  . Occupation: retired  Tobacco Use  . Smoking status: Never Smoker  . Smokeless tobacco: Never Used  Vaping Use  . Vaping Use: Never used  Substance and Sexual Activity  . Alcohol use: No  . Drug use: No  . Sexual activity: Never  Other Topics Concern  . Not on file  Social History Narrative   ** Merged History Encounter **       Married 4 children Never smoker Denies alcohol use Full Code   Social Determinants of Radio broadcast assistant Strain: Not on file  Food Insecurity: Not on file  Transportation Needs: Not on file  Physical Activity: Not on file  Stress: Not on file  Social Connections: Not on file     Family History:  The patient's family history includes Breast cancer (age of onset: 31) in her sister; Breast cancer (age of onset: 71) in her mother; Cancer in her mother; Colon cancer in her brother, brother, and father; Stroke in her sister.  ROS:   Review of Systems  Constitutional: Positive for malaise/fatigue. Negative for chills, diaphoresis, fever and weight loss.  HENT: Negative for congestion.   Eyes: Negative for discharge and redness.  Respiratory: Negative for cough, sputum production, shortness of breath and wheezing.   Cardiovascular: Negative for chest pain, palpitations, orthopnea, claudication, leg  swelling and PND.  Gastrointestinal: Negative for abdominal pain, heartburn, nausea and vomiting.  Musculoskeletal: Negative for falls and myalgias.  Skin: Negative for rash.  Neurological: Negative for dizziness, tingling, tremors, sensory change, speech change, focal weakness, loss of consciousness and weakness.  Endo/Heme/Allergies: Does not bruise/bleed easily.  Psychiatric/Behavioral: Negative for substance abuse. The patient is not nervous/anxious.   All other systems reviewed and are negative.    EKGs/Labs/Other Studies Reviewed:    Studies reviewed were summarized above. The additional studies were reviewed today:  2D echo 08/19/2020: 1. Left ventricular ejection fraction, by estimation, is 60 to 65%. The  left ventricle has normal function. The left ventricle has no regional  wall motion abnormalities. There is moderate left ventricular hypertrophy.  Left ventricular diastolic  parameters are consistent with Grade I diastolic dysfunction (impaired  relaxation).  2. Right ventricular systolic function is normal. The right ventricular  size is normal.  3. The mitral valve is normal in structure. Mild to moderate mitral valve  regurgitation.  4. The aortic valve is normal in structure. Aortic valve regurgitation is  not visualized. Mild to moderate aortic valve stenosis. Aortic valve area,  by VTI measures 1.70 cm. Aortic valve mean gradient measures 16.3 mmHg.  Aortic valve Vmax measures 2.60  m/s.  __________  2D echo 11/04/2019: 1. Left ventricular ejection fraction, by estimation, is 70 to 75%. The  left ventricle has hyperdynamic function. The left ventricle has no  regional wall motion abnormalities. There is moderate asymmetric left  ventricular hypertrophy of the basal-septal  segment. Left ventricular diastolic parameters are consistent with Grade  II diastolic dysfunction (pseudonormalization). Elevated left atrial  pressure.  2. Right ventricular systolic  function is normal. The right ventricular  size is normal. Mildly increased right ventricular wall thickness. There  is moderately elevated pulmonary artery systolic pressure.  3. Left atrial size was moderately dilated.  4. The mitral valve is degenerative. Mild mitral valve regurgitation. No  evidence of mitral stenosis.  5. Tricuspid valve regurgitation is mild to moderate.  6. The aortic valve is tricuspid. Aortic valve regurgitation is not  visualized. Mild to moderate aortic valve stenosis.  7. The inferior vena cava is normal in size with greater than 50%  respiratory variability, suggesting right atrial pressure of 3 mmHg.   Comparison(s): EF 60-65%, mild AS mean gradient 51mmHg, AVA 1.39cm2. __________  2D echo 11/28/2018: 1. The left ventricle has normal systolic function with an ejection  fraction of 60-65%. The cavity size was normal. There is mild to  moderately increased left ventricular wall thickness. Left ventricular  diastolic Doppler parameters are consistent with  impaired relaxation.Near cavity obliteration in systole.  2. The right ventricle has normal systolic function. The cavity was  normal. There is no increase in right ventricular wall thickness. Right  ventricular systolic pressure is mildly elevated with an estimated  pressure of 40.7 mmHg.  3. Left atrial size was mildly dilated.  4. Mild calcification of the aortic valve. Aortic valve regurgitation is  trivial by color flow Doppler. Mild stenosis of the aortic valve. Mean  gradient of 16 mm Hg, estimate AVA of 1.39 cm sq. __________  2D echo 10/29/2017: - Left ventricle: The cavity size was normal. Wall thickness was  increased in a pattern of mild LVH. There was moderate focal  basal hypertrophy. Systolic function was normal. The estimated  ejection fraction was in the range of 60% to 65%. Wall motion was  normal; there were no regional wall motion abnormalities.  Features are  consistent with a pseudonormal left ventricular  filling pattern, with concomitant abnormal relaxation and  increased filling pressure (grade 2 diastolic dysfunction).  Doppler parameters are consistent with high ventricular filling  pressure.  - Aortic valve: Trileaflet; mildly thickened, mildly calcified  leaflets. Cusp separation was reduced. Transvalvular velocity was  minimally increased. There was mild stenosis.  - Mitral valve: Calcified annulus. There was mild regurgitation.  - Left atrium: The atrium was mildly dilated.  - Right ventricle: The cavity size was normal. Wall thickness was  normal. Systolic function was normal.  - Tricuspid valve: There was mild-moderate regurgitation.  - Pulmonary arteries: Systolic pressure was mildly to moderately  increased, estimated to be 40 mm Hg. __________  2D echo 05/28/2017: - Left ventricle: The cavity size was normal. There was severe  concentric hypertrophy. Systolic function was vigorous. The  estimated ejection fraction was in the range of 65% to 70%. Wall  motion was normal; there were no regional wall motion  abnormalities. Features are consistent with a pseudonormal left  ventricular filling pattern, with concomitant abnormal relaxation  and increased filling pressure (grade 2 diastolic dysfunction).  Doppler parameters are consistent with elevated ventricular  end-diastolic filling pressure.  - Aortic valve: Trileaflet; severely thickened, severely calcified  leaflets. There was moderate stenosis. There was mild  regurgitation. Mean gradient (S): 29 mm Hg. Peak gradient (S): 50  mm Hg. Valve area (VTI): 1.84 cm^2. Valve area (Vmax): 1.75 cm^2.  Valve area (Vmean): 1.73 cm^2.  - Mitral valve: Calcified annulus. Mildly thickened leaflets . The  findings are consistent with mild stenosis. There was mild  regurgitation. Valve area by continuity equation (using LVOT  flow): 2.55 cm^2.   - Right ventricle: The cavity size was normal. Wall thickness was  normal. Systolic function was normal.  - Tricuspid valve: There was severe regurgitation.  - Pulmonic valve: There was mild regurgitation.  - Pulmonary arteries: Systolic pressure was severely increased. PA  peak pressure: 100 mm Hg (S).  - Inferior vena cava: The vessel was normal in size.  - Pericardium, extracardiac: A  trivial pericardial effusion was  identified posterior to the heart. __________  Carotid artery ultrasound 09/01/2016: RIGHT CAROTID ARTERY: No significant calcifications of the right common carotid artery. Intermediate waveform maintained. Heterogeneous and partially calcified plaque at the right carotid bifurcation. No significant lumen shadowing. Low resistance waveform of the right ICA. Mild tortuosity  RIGHT VERTEBRAL ARTERY: Antegrade flow with low resistance waveform.  LEFT CAROTID ARTERY: No significant calcifications of the left common carotid artery. Intermediate waveform maintained. Heterogeneous and partially calcified plaque at the left carotid bifurcation without significant lumen shadowing. Low resistance waveform of the left ICA. Mild tortuosity  LEFT VERTEBRAL ARTERY:  Antegrade flow with low resistance waveform.  IMPRESSION: Color duplex indicates moderate heterogeneous and calcified plaque, with no hemodynamically significant stenosis by duplex criteria in the extracranial cerebrovascular circulation. __________  2D echo 09/01/2016: - Left ventricle: The cavity size was normal. There was moderate  asymmetric hypertrophy of the septum. Systolic function was  vigorous. The estimated ejection fraction was in the range of 65%  to 70%. Wall motion was normal; there were no regional wall  motion abnormalities. Doppler parameters are consistent with  abnormal left ventricular relaxation (grade 1 diastolic  dysfunction).  - Aortic valve: There was mild  stenosis. Mean gradient (S): 14 mm  Hg. Valve area (VTI): 1.53 cm^2.  - Mitral valve: There was mild regurgitation.  - Pulmonary arteries: Systolic pressure was mildly increased. PA  peak pressure: 45 mm Hg (S).  - Pericardium, extracardiac: A trivial pericardial effusion was  identified posterior to the heart. __________  [facility] 08/31/2016:  The left ventricular systolic function is normal.  LV end diastolic pressure is moderately elevated.  The left ventricular ejection fraction is 55-65% by visual estimate.  Mid RCA lesion, 60 %stenosed.  Dist LAD lesion, 40 %stenosed.  Mid LAD lesion, 30 %stenosed.   1. Mild to moderate nonobstructive coronary artery disease. 2. Normal LV systolic function. 3. Mild aortic stenosis with a peak to peak gradient of 15 mmHg. There is also possibly a mild LVOT gradient. 4. Severely elevated systemic pressure with moderately elevated left ventricular end-diastolic pressure.  Recommendations: I do not see a culprit for non-ST elevation myocardial infarction. The worse stenosis of 60% in the midright coronary artery. It is possible that her myocardial infarction was due to supply demand ischemia in the setting of  elevated blood pressure. I recommend blood pressure control and Plavix for 1 year given that she is allergic to aspirin. I doubled the dose of Toprol to 100 mg once daily. I increased the dose of atorvastatin to 40 mg daily. __________  2D echo 07/28/2016: - Left ventricle: The cavity size was normal. Systolic function was  normal. The estimated ejection fraction was in the range of 60%  to 65%. Wall motion was normal; there were no regional wall  motion abnormalities. Doppler parameters are consistent with  abnormal left ventricular relaxation (grade 1 diastolic  dysfunction).  - Aortic valve: There was very mild stenosis. Peak gradient (S): 29  mm Hg.  - Mitral valve: Calcified annulus. There was mild regurgitation.  -  Right ventricle: Systolic function was normal.  - Pulmonary arteries: Systolic pressure was mildly elevated. PA  peak pressure: 35 mm Hg (S). __________  2D echo 06/21/2015: - Left ventricle: The cavity size was normal. There was mild  concentric hypertrophy. Systolic function was vigorous. The  estimated ejection fraction was in the range of 65% to 70%. Wall  motion was normal; there were no regional wall  motion  abnormalities. Doppler parameters are consistent with abnormal  left ventricular relaxation (grade 1 diastolic dysfunction).  - Aortic valve: There was mild stenosis. Mean gradient (S): 16 mm  Hg. Valve area (VTI): 1.52 cm^2.  - Mitral valve: There was mild regurgitation.  - Pulmonary arteries: PA peak pressure: 34 mm Hg (S).   Impressions:   - Aortic stenosis is worse but still mild. __________  2D echo 06/05/2013: - Left ventricle: The cavity size was normal. There was mild  focal basal hypertrophy of the septum. Systolic function  was normal. The estimated ejection fraction was in the  range of 55% to 65%. Wall motion was normal; there were no  regional wall motion abnormalities. Doppler parameters are  consistent with abnormal left ventricular relaxation  (grade 1 diastolic dysfunction).  - Aortic valve: Transvalvular velocity was increased. There  was mild stenosis. Trivial regurgitation. Peak gradient:  30mm Hg (S).  - Mitral valve: Mild regurgitation.  - Right ventricle: Systolic function was normal.  - Pulmonary arteries: Systolic pressure was within the  normal range. __________  2D echo 05/28/2012: - Left ventricle: The cavity size was normal. There was mild  focal basal and mild concentric hypertrophy of the septum.  Systolic function was normal. The estimated ejection  fraction was in the range of 55% to 60%. Wall motion was  normal; there were no regional wall motion abnormalities.  Doppler parameters are  consistent with abnormal left  ventricular relaxation (grade 1 diastolic dysfunction).  - Aortic valve: Transvalvular velocity was increased. There  was mild stenosis. Mean gradient: 59mm Hg (S). Peak  gradient: 30mm Hg (S). Valve area: 1.72cm^2(VTI).  - Mitral valve: Mild regurgitation.  - Right ventricle: Systolic function was normal.  - Pulmonary arteries: Systolic pressure was within the  normal range. PA peak pressure: 32mm Hg (S). __________  Carotid artery ultrasound 03/28/2011: IMPRESSION:  No evidence of hemodynamically significant stenosis in the extracranial  arteries.    EKG:  EKG is ordered today.  The EKG ordered today demonstrates NSR, 73 bpm, possible prior anterior and inferior infarct, nonspecific ST-T changes in lead I with T wave version in aVL consistent with prior tracing  Recent Labs: 08/20/2020: Magnesium 2.2 08/26/2020: ALT 26; BUN 46; Creatinine, Ser 1.13; Hemoglobin 10.6; Platelets 181; Potassium 4.1; Sodium 132  Recent Lipid Panel    Component Value Date/Time   CHOL 145 01/17/2019 1346   TRIG 314.0 (H) 01/17/2019 1346   HDL 32.80 (L) 01/17/2019 1346   CHOLHDL 4 01/17/2019 1346   VLDL 62.8 (H) 01/17/2019 1346   LDLCALC 68 12/04/2016 0808   LDLDIRECT 48.0 07/16/2019 1012    PHYSICAL EXAM:    VS:  BP (!) 144/60 (BP Location: Right Arm, Patient Position: Sitting, Cuff Size: Normal)   Pulse 73   Ht 5\' 1"  (1.549 m)   Wt 138 lb (62.6 kg)   SpO2 95%   BMI 26.07 kg/m   BMI: Body mass index is 26.07 kg/m.  Physical Exam Vitals reviewed.  Constitutional:      Appearance: She is well-developed.  HENT:     Head: Normocephalic and atraumatic.  Eyes:     General:        Right eye: No discharge.        Left eye: No discharge.  Neck:     Vascular: No JVD.  Cardiovascular:     Rate and Rhythm: Normal rate and regular rhythm.     Pulses: No midsystolic click and no opening snap.  Posterior tibial pulses are 2+ on the right side and 2+  on the left side.     Heart sounds: S1 normal and S2 normal. Heart sounds not distant. Murmur heard.   Harsh midsystolic murmur is present with a grade of 2/6 at the upper right sternal border radiating to the neck. No friction rub.  Pulmonary:     Effort: Pulmonary effort is normal. No respiratory distress.     Breath sounds: Normal breath sounds. No decreased breath sounds, wheezing or rales.  Chest:     Chest wall: No tenderness.  Abdominal:     General: There is no distension.     Palpations: Abdomen is soft.     Tenderness: There is no abdominal tenderness.  Musculoskeletal:     Cervical back: Normal range of motion.  Skin:    General: Skin is warm and dry.     Nails: There is no clubbing.  Neurological:     Mental Status: She is alert and oriented to person, place, and time.  Psychiatric:        Speech: Speech normal.        Behavior: Behavior normal.        Thought Content: Thought content normal.        Judgment: Judgment normal.     Wt Readings from Last 3 Encounters:  09/09/20 138 lb (62.6 kg)  08/26/20 138 lb 7.2 oz (62.8 kg)  08/24/20 140 lb (63.5 kg)     ASSESSMENT & PLAN:   1. CAD involving the native coronary arteries with recent NSTEMI without angina: She is doing well without symptoms concerning for angina.  She remains on Plavix monotherapy with documented history of anaphylaxis with aspirin.  Echo earlier this month demonstrated preserved LV systolic function with no regional wall motion abnormalities.  Continue secondary prevention with atorvastatin, carvedilol and Imdur, losartan, and as needed SL NTG.  Due to patient preference and advanced age, and in the setting of echo demonstrating preserved LV systolic function with no regional wall motion abnormalities, conservative therapy has been recommended.  2. Moderate aortic stenosis: Stable on echo earlier this month.  Repeat echo in 12 months.  3. HFpEF: She appears euvolemic and well compensated.  Weight  stable by our scale when compared to last visit in 06/2020.  4. HTN: Blood pressure initially 160/70 with recheck by CMA of 144/60.  After discussion with patient's primary cardiologist no changes were made at today's visit with recommendation to maintain systolic blood pressure in the 140s to 160s given advanced age and history of moderate aortic stenosis.  Continue current medical therapy.  5. HLD: LDL 48 in 06/2019.  She remains on atorvastatin 40 mg.  6. AKI/left flank discomfort: Check BMP.  Follow-up with PCP.   Disposition: F/u with Dr. Fletcher Anon or an APP in 3 months.   Medication Adjustments/Labs and Tests Ordered: Current medicines are reviewed at length with the patient today.  Concerns regarding medicines are outlined above. Medication changes, Labs and Tests ordered today are summarized above and listed in the Patient Instructions accessible in Encounters.   Signed, Christell Faith, PA-C 09/09/2020 12:30 PM     [facility] [address] [place], [place] [address] [phone]

## 2020-09-09 ENCOUNTER — Encounter: Payer: Self-pay | Admitting: Internal Medicine

## 2020-09-09 ENCOUNTER — Ambulatory Visit (INDEPENDENT_AMBULATORY_CARE_PROVIDER_SITE_OTHER): Payer: Medicare Other | Admitting: Physician Assistant

## 2020-09-09 ENCOUNTER — Telehealth: Payer: Self-pay | Admitting: Physician Assistant

## 2020-09-09 ENCOUNTER — Encounter: Payer: Self-pay | Admitting: Physician Assistant

## 2020-09-09 ENCOUNTER — Other Ambulatory Visit: Payer: Self-pay

## 2020-09-09 VITALS — BP 144/60 | HR 73 | Ht 61.0 in | Wt 138.0 lb

## 2020-09-09 DIAGNOSIS — E1122 Type 2 diabetes mellitus with diabetic chronic kidney disease: Secondary | ICD-10-CM | POA: Diagnosis not present

## 2020-09-09 DIAGNOSIS — I2510 Atherosclerotic heart disease of native coronary artery without angina pectoris: Secondary | ICD-10-CM | POA: Diagnosis not present

## 2020-09-09 DIAGNOSIS — J849 Interstitial pulmonary disease, unspecified: Secondary | ICD-10-CM | POA: Diagnosis not present

## 2020-09-09 DIAGNOSIS — J45909 Unspecified asthma, uncomplicated: Secondary | ICD-10-CM | POA: Diagnosis not present

## 2020-09-09 DIAGNOSIS — E1151 Type 2 diabetes mellitus with diabetic peripheral angiopathy without gangrene: Secondary | ICD-10-CM

## 2020-09-09 DIAGNOSIS — M5442 Lumbago with sciatica, left side: Secondary | ICD-10-CM

## 2020-09-09 DIAGNOSIS — N183 Chronic kidney disease, stage 3 unspecified: Secondary | ICD-10-CM | POA: Diagnosis not present

## 2020-09-09 DIAGNOSIS — F411 Generalized anxiety disorder: Secondary | ICD-10-CM

## 2020-09-09 DIAGNOSIS — D509 Iron deficiency anemia, unspecified: Secondary | ICD-10-CM | POA: Diagnosis not present

## 2020-09-09 DIAGNOSIS — I10 Essential (primary) hypertension: Secondary | ICD-10-CM

## 2020-09-09 DIAGNOSIS — E785 Hyperlipidemia, unspecified: Secondary | ICD-10-CM

## 2020-09-09 DIAGNOSIS — Z8616 Personal history of COVID-19: Secondary | ICD-10-CM

## 2020-09-09 DIAGNOSIS — N179 Acute kidney failure, unspecified: Secondary | ICD-10-CM | POA: Diagnosis not present

## 2020-09-09 DIAGNOSIS — I350 Nonrheumatic aortic (valve) stenosis: Secondary | ICD-10-CM | POA: Diagnosis not present

## 2020-09-09 DIAGNOSIS — Z9049 Acquired absence of other specified parts of digestive tract: Secondary | ICD-10-CM

## 2020-09-09 DIAGNOSIS — I214 Non-ST elevation (NSTEMI) myocardial infarction: Secondary | ICD-10-CM | POA: Diagnosis not present

## 2020-09-09 DIAGNOSIS — I130 Hypertensive heart and chronic kidney disease with heart failure and stage 1 through stage 4 chronic kidney disease, or unspecified chronic kidney disease: Secondary | ICD-10-CM | POA: Diagnosis not present

## 2020-09-09 DIAGNOSIS — Z8781 Personal history of (healed) traumatic fracture: Secondary | ICD-10-CM

## 2020-09-09 DIAGNOSIS — F32A Depression, unspecified: Secondary | ICD-10-CM

## 2020-09-09 DIAGNOSIS — Z8619 Personal history of other infectious and parasitic diseases: Secondary | ICD-10-CM

## 2020-09-09 DIAGNOSIS — I252 Old myocardial infarction: Secondary | ICD-10-CM | POA: Diagnosis not present

## 2020-09-09 DIAGNOSIS — H905 Unspecified sensorineural hearing loss: Secondary | ICD-10-CM

## 2020-09-09 DIAGNOSIS — R296 Repeated falls: Secondary | ICD-10-CM

## 2020-09-09 DIAGNOSIS — E1121 Type 2 diabetes mellitus with diabetic nephropathy: Secondary | ICD-10-CM | POA: Diagnosis not present

## 2020-09-09 DIAGNOSIS — H2511 Age-related nuclear cataract, right eye: Secondary | ICD-10-CM | POA: Diagnosis not present

## 2020-09-09 DIAGNOSIS — Z90710 Acquired absence of both cervix and uterus: Secondary | ICD-10-CM

## 2020-09-09 DIAGNOSIS — I5032 Chronic diastolic (congestive) heart failure: Secondary | ICD-10-CM | POA: Diagnosis not present

## 2020-09-09 DIAGNOSIS — K219 Gastro-esophageal reflux disease without esophagitis: Secondary | ICD-10-CM | POA: Diagnosis not present

## 2020-09-09 NOTE — Progress Notes (Signed)
Attempted to fax form to  eye center but the transmission failed x 2. Called number listed and left message for them to call back.

## 2020-09-09 NOTE — Telephone Encounter (Signed)
Attempted to fax to Surgical center and that transmission failed as well.

## 2020-09-09 NOTE — Patient Instructions (Signed)
Medication Instructions:  No changes at this time.   *If you need a refill on your cardiac medications before your next appointment, please call your pharmacy*   Lab Work: BMET today  If you have labs (blood work) drawn today and your tests are completely normal, you will receive your results only by: [name] MyChart Message (if you have MyChart) OR . A paper copy in the mail If you have any lab test that is abnormal or we need to change your treatment, we will call you to review the results.   Testing/Procedures: No echocardiogram needed because this was done in the hospital.    Follow-Up: At [facility], you and your health needs are our priority.  As part of our continuing mission to provide you with exceptional heart care, we have created designated Provider Care Teams.  These Care Teams include your primary Cardiologist (physician) and Advanced Practice Providers (APPs -  Physician Assistants and Nurse Practitioners) who all work together to provide you with the care you need, when you need it.  We recommend signing up for the patient portal called "MyChart".  Sign up information is provided on this After Visit Summary.  MyChart is used to connect with patients for Virtual Visits (Telemedicine).  Patients are able to view lab/test results, encounter notes, upcoming appointments, etc.  Non-urgent messages can be sent to your provider as well.   To learn more about what you can do with MyChart, go to [email or web address].    Your next appointment:   3 month(s)  The format for your next appointment:   In Person  Provider:   [name], MD

## 2020-09-09 NOTE — Telephone Encounter (Signed)
I am removing the aspirin allergy from her list per request from [facility],  Because they will not give her ketorolac topical eye drops following her cataract surgery unless it is clarified that  Patient has not had anaphylaxis from ibuprofen or Aleve.  She has used both of these NSAIDs in the distant past without anaphylaxis.

## 2020-09-09 NOTE — Telephone Encounter (Signed)
Attempted to fax form to [phone] and also to [phone]. Transmission failed to both. This is attempt #4. Call placed to [phone] and left message for someone to call back.

## 2020-09-09 NOTE — Telephone Encounter (Signed)
They were able to get fax sent through to one of the numbers. Closing encounter at this time.

## 2020-09-09 NOTE — Telephone Encounter (Signed)
Called and spoke with Nevin Bloodgood over at [facility] to let her know that none of the faxes went through. She requested that we restart our fax machine and try again. Form given to Lovena Le to retry when fax comes back up.

## 2020-09-10 LAB — BASIC METABOLIC PANEL
BUN/Creatinine Ratio: 23 (ref 12–28)
BUN: 27 mg/dL (ref 10–36)
CO2: 18 mmol/L — ABNORMAL LOW (ref 20–29)
Calcium: 8.9 mg/dL (ref 8.7–10.3)
Chloride: 101 mmol/L (ref 96–106)
Creatinine, Ser: 1.15 mg/dL — ABNORMAL HIGH (ref 0.57–1.00)
Glucose: 190 mg/dL — ABNORMAL HIGH (ref 65–99)
Potassium: 4.2 mmol/L (ref 3.5–5.2)
Sodium: 134 mmol/L (ref 134–144)
eGFR: 44 mL/min/{1.73_m2} — ABNORMAL LOW (ref >59)

## 2020-09-12 ENCOUNTER — Other Ambulatory Visit: Payer: Self-pay | Admitting: Internal Medicine

## 2020-09-14 DIAGNOSIS — I214 Non-ST elevation (NSTEMI) myocardial infarction: Secondary | ICD-10-CM | POA: Diagnosis not present

## 2020-09-14 DIAGNOSIS — I130 Hypertensive heart and chronic kidney disease with heart failure and stage 1 through stage 4 chronic kidney disease, or unspecified chronic kidney disease: Secondary | ICD-10-CM | POA: Diagnosis not present

## 2020-09-14 DIAGNOSIS — E1122 Type 2 diabetes mellitus with diabetic chronic kidney disease: Secondary | ICD-10-CM | POA: Diagnosis not present

## 2020-09-14 DIAGNOSIS — I2510 Atherosclerotic heart disease of native coronary artery without angina pectoris: Secondary | ICD-10-CM | POA: Diagnosis not present

## 2020-09-14 DIAGNOSIS — I252 Old myocardial infarction: Secondary | ICD-10-CM | POA: Diagnosis not present

## 2020-09-14 DIAGNOSIS — I5032 Chronic diastolic (congestive) heart failure: Secondary | ICD-10-CM | POA: Diagnosis not present

## 2020-09-15 ENCOUNTER — Other Ambulatory Visit: Payer: Self-pay | Admitting: Cardiovascular Disease

## 2020-09-15 ENCOUNTER — Other Ambulatory Visit: Payer: Self-pay | Admitting: Internal Medicine

## 2020-09-15 NOTE — Telephone Encounter (Signed)
Please advise if ok to refill pt's isosorbide 30 mg 1 tablet am and 2 tablets pm daily. Pt's medication last filled by Dr. Max Sane, MD @ [facility]. Pt would like a 90 day refill.

## 2020-09-16 ENCOUNTER — Encounter: Payer: Self-pay | Admitting: Ophthalmology

## 2020-09-24 NOTE — Discharge Instructions (Signed)

## 2020-09-28 ENCOUNTER — Other Ambulatory Visit: Payer: Self-pay | Admitting: Internal Medicine

## 2020-09-29 ENCOUNTER — Ambulatory Visit: Payer: Medicare Other | Admitting: Anesthesiology

## 2020-09-29 ENCOUNTER — Other Ambulatory Visit: Payer: Self-pay

## 2020-09-29 ENCOUNTER — Encounter: Payer: Self-pay | Admitting: Ophthalmology

## 2020-09-29 ENCOUNTER — Encounter
Admission: RE | Disposition: A | Discharge status: Home / Self Care | Payer: Self-pay | Source: Home / Self Care | Attending: Ophthalmology

## 2020-09-29 ENCOUNTER — Ambulatory Visit
Admission: RE | Admit: 2020-09-29 | Discharge: 2020-09-29 | Disposition: A | Discharge status: Home / Self Care | Payer: Medicare Other | Attending: Ophthalmology | Admitting: Ophthalmology

## 2020-09-29 DIAGNOSIS — Z885 Allergy status to narcotic agent status: Secondary | ICD-10-CM | POA: Diagnosis not present

## 2020-09-29 DIAGNOSIS — H2511 Age-related nuclear cataract, right eye: Secondary | ICD-10-CM | POA: Diagnosis not present

## 2020-09-29 DIAGNOSIS — Z7902 Long term (current) use of antithrombotics/antiplatelets: Secondary | ICD-10-CM | POA: Diagnosis not present

## 2020-09-29 DIAGNOSIS — Z7984 Long term (current) use of oral hypoglycemic drugs: Secondary | ICD-10-CM | POA: Insufficient documentation

## 2020-09-29 DIAGNOSIS — Z882 Allergy status to sulfonamides status: Secondary | ICD-10-CM | POA: Diagnosis not present

## 2020-09-29 DIAGNOSIS — Z886 Allergy status to analgesic agent status: Secondary | ICD-10-CM | POA: Diagnosis not present

## 2020-09-29 DIAGNOSIS — Z79899 Other long term (current) drug therapy: Secondary | ICD-10-CM | POA: Insufficient documentation

## 2020-09-29 DIAGNOSIS — Z8616 Personal history of COVID-19: Secondary | ICD-10-CM | POA: Insufficient documentation

## 2020-09-29 DIAGNOSIS — Z880 Allergy status to penicillin: Secondary | ICD-10-CM | POA: Insufficient documentation

## 2020-09-29 DIAGNOSIS — Z888 Allergy status to other drugs, medicaments and biological substances status: Secondary | ICD-10-CM | POA: Diagnosis not present

## 2020-09-29 DIAGNOSIS — E1136 Type 2 diabetes mellitus with diabetic cataract: Secondary | ICD-10-CM | POA: Insufficient documentation

## 2020-09-29 DIAGNOSIS — Z96641 Presence of right artificial hip joint: Secondary | ICD-10-CM | POA: Diagnosis not present

## 2020-09-29 DIAGNOSIS — H25811 Combined forms of age-related cataract, right eye: Secondary | ICD-10-CM | POA: Diagnosis not present

## 2020-09-29 HISTORY — DX: Family history of other specified conditions: Z84.89

## 2020-09-29 HISTORY — DX: Other specified postprocedural states: Z98.890

## 2020-09-29 HISTORY — PX: CATARACT EXTRACTION W/PHACO: SHX586

## 2020-09-29 HISTORY — DX: Other specified postprocedural states: R11.2

## 2020-09-29 SURGERY — PHACOEMULSIFICATION, CATARACT, WITH IOL INSERTION
Anesthesia: Monitor Anesthesia Care | Site: Eye | Laterality: Right

## 2020-09-29 MED ORDER — TETRACAINE HCL 0.5 % OP SOLN
1.0000 [drp] | OPHTHALMIC | Status: DC | PRN
  Administered 2020-09-29 (×3): 1 [drp] via OPHTHALMIC

## 2020-09-29 MED ORDER — BRIMONIDINE TARTRATE-TIMOLOL 0.2-0.5 % OP SOLN
OPHTHALMIC | Status: DC | PRN
  Administered 2020-09-29: 1 [drp] via OPHTHALMIC

## 2020-09-29 MED ORDER — TRYPAN BLUE 0.06 % OP SOLN
OPHTHALMIC | Status: DC | PRN
  Administered 2020-09-29: 0.5 mL via INTRAOCULAR

## 2020-09-29 MED ORDER — LIDOCAINE HCL (PF) 2 % IJ SOLN
INTRAOCULAR | Status: DC | PRN
  Administered 2020-09-29: 1 mL

## 2020-09-29 MED ORDER — ONDANSETRON HCL 4 MG/2ML IJ SOLN
INTRAMUSCULAR | Status: DC | PRN
  Administered 2020-09-29: 4 mg via INTRAVENOUS

## 2020-09-29 MED ORDER — ONDANSETRON HCL 4 MG/2ML IJ SOLN: 4.0000 mg | Freq: Once | INTRAMUSCULAR | Status: DC | PRN

## 2020-09-29 MED ORDER — ACETAMINOPHEN 325 MG PO TABS
325.0000 mg | ORAL_TABLET | ORAL | Status: DC | PRN
Start: 2020-09-29 — End: 2020-09-29

## 2020-09-29 MED ORDER — NA HYALUR & NA CHOND-NA HYALUR 0.4-0.35 ML IO KIT
PACK | INTRAOCULAR | Status: DC | PRN
  Administered 2020-09-29: 1 mL via INTRAOCULAR

## 2020-09-29 MED ORDER — MOXIFLOXACIN HCL 0.5 % OP SOLN
OPHTHALMIC | Status: DC | PRN
  Administered 2020-09-29: 0.2 mL via OPHTHALMIC

## 2020-09-29 MED ORDER — MIDAZOLAM HCL 2 MG/2ML IJ SOLN
INTRAMUSCULAR | Status: DC | PRN
  Administered 2020-09-29: .5 mg via INTRAVENOUS

## 2020-09-29 MED ORDER — ACETAMINOPHEN 160 MG/5ML PO SOLN: 325.0000 mg | ORAL | Status: DC | PRN

## 2020-09-29 MED ORDER — LACTATED RINGERS IV SOLN: INTRAVENOUS | Status: DC

## 2020-09-29 MED ORDER — EPINEPHRINE PF 1 MG/ML IJ SOLN
INTRAOCULAR | Status: DC | PRN
  Administered 2020-09-29: 54 mL via OPHTHALMIC

## 2020-09-29 MED ORDER — ARMC OPHTHALMIC DILATING DROPS
1.0000 "application " | OPHTHALMIC | Status: DC | PRN
  Administered 2020-09-29 (×3): 1 via OPHTHALMIC

## 2020-09-29 SURGICAL SUPPLY — 18 items
CANNULA ANT/CHMB 27GA (MISCELLANEOUS) ×2 IMPLANT
GLOVE SURG ENC TEXT LTX SZ7.5 (GLOVE) ×2 IMPLANT
GLOVE SURG TRIUMPH 8.0 PF LTX (GLOVE) ×2 IMPLANT
GOWN STRL REUS W/ TWL LRG LVL3 (GOWN DISPOSABLE) ×2 IMPLANT
GOWN STRL REUS W/TWL LRG LVL3 (GOWN DISPOSABLE) ×4
LENS IOL TECNIS EYHANCE 24.5 (Intraocular Lens) ×2 IMPLANT
MARKER SKIN DUAL TIP RULER LAB (MISCELLANEOUS) ×2 IMPLANT
NEEDLE CAPSULORHEX 25GA (NEEDLE) ×2 IMPLANT
NEEDLE FILTER BLUNT 18X 1/2SAF (NEEDLE) ×2
NEEDLE FILTER BLUNT 18X1 1/2 (NEEDLE) ×2 IMPLANT
PACK CATARACT BRASINGTON (MISCELLANEOUS) ×2 IMPLANT
PACK EYE AFTER SURG (MISCELLANEOUS) ×2 IMPLANT
PACK OPTHALMIC (MISCELLANEOUS) ×2 IMPLANT
SOLUTION OPHTHALMIC SALT (MISCELLANEOUS) ×2 IMPLANT
SYR 3ML LL SCALE MARK (SYRINGE) ×4 IMPLANT
SYR TB 1ML LUER SLIP (SYRINGE) ×2 IMPLANT
WATER STERILE IRR 250ML POUR (IV SOLUTION) ×2 IMPLANT
WIPE NON LINTING 3.25X3.25 (MISCELLANEOUS) ×2 IMPLANT

## 2020-09-29 NOTE — Op Note (Signed)
  LOCATION:  [place]   PREOPERATIVE DIAGNOSIS:    Nuclear sclerotic cataract right eye. H25.11   POSTOPERATIVE DIAGNOSIS:  Nuclear sclerotic cataract right eye.     PROCEDURE:  Phacoemusification with posterior chamber intraocular lens placement of the right eye. Vision blue was used to stain the lens capsule  ULTRASOUND TIME: Procedure(s): CATARACT EXTRACTION PHACO AND INTRAOCULAR LENS PLACEMENT (IOC) RIGHT VISION BLUE 7.87 00:58.7  (Right)  LENS:   Implant Name Type Inv. Item Serial No. Manufacturer Lot No. LRB No. Used Action  LENS IOL TECNIS EYHANCE 24.5 - [id number] Intraocular Lens LENS IOL TECNIS EYHANCE 24.5 [id number]   Right 1 Implanted         SURGEON:  Wyonia Hough, MD   ANESTHESIA:  Topical with tetracaine drops and 2% Xylocaine jelly, augmented with 1% preservative-free intracameral lidocaine.    COMPLICATIONS:  None.   DESCRIPTION OF PROCEDURE:  The patient was identified in the holding room and transported to the operating room and placed in the supine position under the operating microscope.  The right eye was identified as the operative eye and it was prepped and draped in the usual sterile ophthalmic fashion.   A 1 millimeter clear-corneal paracentesis was made at the 12:00 position.  0.5 ml of preservative-free 1% lidocaine was injected into the anterior chamber. Vision blue was placed into the anterior chamber followed by balanced salt to rinse it out. The anterior chamber was filled with Viscoat viscoelastic.  A 2.4 millimeter keratome was used to make a near-clear corneal incision at the 9:00 position.  A curvilinear capsulorrhexis was made with a cystotome and capsulorrhexis forceps.  Balanced salt solution was used to hydrodissect and hydrodelineate the nucleus.   Phacoemulsification was then used in stop and chop fashion to remove the lens nucleus and epinucleus.  The remaining cortex was then removed using the irrigation and  aspiration handpiece. Provisc was then placed into the capsular bag to distend it for lens placement.  A lens was then injected into the capsular bag.  The remaining viscoelastic was aspirated.   Wounds were hydrated with balanced salt solution.  The anterior chamber was inflated to a physiologic pressure with balanced salt solution.  No wound leaks were noted. Vigamox 0.2 ml of a 1mg  per ml solution was injected into the anterior chamber for a dose of 0.2 mg of intracameral antibiotic at the completion of the case.   Timolol and Brimonidine drops were applied to the eye.  The patient was taken to the recovery room in stable condition without complications of anesthesia or surgery.   Maureen Morales 09/29/2020, 9:44 AM

## 2020-09-29 NOTE — H&P (Signed)
[facility]   Primary Care Physician:  [name], MD Ophthalmologist: Dr. Leandrew Koyanagi  Pre-Procedure History & Physical: HPI:  Maureen Morales is a 87 y.o. female here for ophthalmic surgery.   Past Medical History:  Diagnosis Date  . Aortic stenosis    a. 08/2016 Echo: Hyperdynamic LV fxn, mild AS (mean grad 29mmHg), mild MR, mild PAH; b. 05/2017 Echo: Hyperdynamic LV fxn, mod AS (mean grad 85mmHg, valve area 1.84). PASP 1100mmHg.  . Asthma without status asthmaticus    unspecified  . Closed right hip fracture ([place]) 05/26/2017  . Colitis    unspecified  . Complication of anesthesia    sensitive to anesthesia  . Depression   . Diabetes mellitus type 2, uncomplicated ([place])   . Essential hypertension   . Family history of adverse reaction to anesthesia    "Whole family" - PO Nausea  . GERD (gastroesophageal reflux disease)   . 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 Hyperlipidemia    unspecified  . Hypertension    a. 08/2017 Renal U/S: No RAS.  [name] Low back pain with left-sided sciatica 04/29/2018  . Myocardial infarction ([place]) 2018  . Non-obstructive CAD (coronary artery disease)    a. 08/2016 NSTEMI/Cath: mild to mod nonobs dzs including 60% mRCA stenosis-->Med rx.  . NSTEMI (non-ST elevated myocardial infarction) ([place]) 08/30/2016  . NSTEMI (non-ST elevated myocardial infarction) ([place]) 08/18/2020  . Nummular eczema 01/16/2011   Occurring on chest and under breast.  Treated by Dr Evorn Gong with fluocinonide 0.05% topical 06/26/12   . Peripheral vascular disease ([place])   . 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 PONV (postoperative nausea and vomiting)   . Positive H. pylori test   . Pre-diabetes   . Suspected COVID-19 virus infection 01/10/2019   Patient reporting malaise and headache for the past 3 days,  Similar to presentation when she was positive for  COVID 19 INFECTION  July 2 .  Previous inection was caused by  exposure to COVID positive caregiver.  Repeat scenario:  Same caregiver's mother tested positive on Monday and caregiver saw current patient on Tuesday    . Viral gastroenteritis due to [place]-like agent Nov 2012    Past Surgical History:  Procedure Laterality Date  . ABDOMINAL HYSTERECTOMY    . BLADDER SURGERY    . CARDIAC CATHETERIZATION    . CHOLECYSTECTOMY    . COLONOSCOPY     06/16/1987, 02/20/1995, 05/04/1999, 02/14/2005, 05/17/2007  . ESOPHAGOGASTRODUODENOSCOPY     05/11/1987, 06/13/1995, 10/09/1995, 05/04/1999, 01/16/2002  . FLEXIBLE SIGMOIDOSCOPY N/A 05/02/2017   Procedure: FLEXIBLE SIGMOIDOSCOPY;  Surgeon: Manya Silvas, MD;  Location: [facility] ENDOSCOPY;  Service: Endoscopy;  Laterality: N/A;  . HEMORRHOID SURGERY N/A 05/14/2017   Procedure: EXTERNAL THROMBOSED HEMORRHOIDECTOMY;  Surgeon: Robert Bellow, MD;  Location: [facility] ORS;  Service: General;  Laterality: N/A;  . HIP ARTHROPLASTY Right 02/17/2017   Procedure: ARTHROPLASTY BIPOLAR HIP (HEMIARTHROPLASTY);  Surgeon: Claud Kelp, MD;  Location: [facility] ORS;  Service: Orthopedics;  Laterality: Right;  . LEFT HEART CATH AND CORONARY ANGIOGRAPHY N/A 08/31/2016   Procedure: Left Heart Cath and Coronary Angiography;  Surgeon: Wellington Hampshire, MD;  Location: [facility] CV LAB;  Service: Cardiovascular;  Laterality: N/A;  . ORIF PERIPROSTHETIC FRACTURE Right 05/27/2017  . ORIF PERIPROSTHETIC FRACTURE Right 05/27/2017   Procedure: OPEN REDUCTION INTERNAL FIXATION (ORIF) PERIPROSTHETIC FRACTURE;  Surgeon: Mcarthur Rossetti, MD;  Location: [facility];  Service: Orthopedics;  Laterality: Right;  . TOTAL HIP ARTHROPLASTY Right   . uterian prolapse      Prior to Admission medications   Medication Sig Start Date End Date Taking? Authorizing Provider  acetaminophen (TYLENOL) 500 MG tablet Take 1,000 mg by mouth every 6 (six) hours as needed for moderate pain or headache.   Yes [provider]  albuterol (VENTOLIN HFA) 108 (90  Base) MCG/ACT inhaler Inhale 2 puffs into the lungs every 6 (six) hours as needed for wheezing or shortness of breath. 02/13/19  Yes Tyler Pita, MD  atorvastatin (LIPITOR) 40 MG tablet TAKE 1 TABLET BY MOUTH DAILY 09/01/20  Yes [name], MD  Carboxymethylcellul-Glycerin (LUBRICATING EYE DROPS OP) Place 1 drop into both eyes daily as needed (dry eyes).   Yes [provider]  carvedilol (COREG) 12.5 MG tablet Take 0.5 tablets (6.25 mg total) by mouth 2 (two) times daily with a meal. 08/28/20 09/27/20 Yes [name], MD  clopidogrel (PLAVIX) 75 MG tablet TAKE 1 TABLET BY MOUTH DAILY. 09/13/20  Yes [name], MD  famotidine (PEPCID) 10 MG tablet Take 10 mg by mouth daily as needed for heartburn or indigestion.   Yes [provider]  glipiZIDE (GLUCOTROL) 5 MG tablet TAKE 1 TABLET BY MOUTH TWICE DAILY BEFORE A MEAL 08/06/20  Yes [name], MD  hydrALAZINE (APRESOLINE) 50 MG tablet Take 50-75 mg by mouth See admin instructions. Take 75 mg with breakfast, 75 mg with lunch, and 50 mg with dinner   Yes [provider]  hydrochlorothiazide (MICROZIDE) 12.5 MG capsule TAKE 1 CAPSULE BY MOUTH EVERY DAY 08/12/20  Yes [name], MD  isosorbide mononitrate (IMDUR) 30 MG 24 hr tablet TAKE ONE TABLET BY MOUTH EVERY MORNING AND TAKE TWO TABLETS EVERY EVENING 09/15/20  Yes Dunn, Areta Haber, PA-C  Lactobacillus (ULTIMATE PROBIOTIC FORMULA) CAPS Take 1 capsule by mouth daily with supper.   Yes [provider]  lansoprazole (PREVACID) 30 MG capsule Take 30 mg by mouth daily as needed.   Yes [provider]  loratadine (CLARITIN) 10 MG tablet Take 10 mg daily by mouth.    Yes [provider]  losartan (COZAAR) 100 MG tablet TAKE ONE TABLET BY MOUTH EVERY DAY 09/15/20  Yes [name], MD  nitroGLYCERIN (NITROSTAT) 0.4 MG SL tablet Place 1 tablet (0.4 mg total) under the tongue every 5 (five) minutes as needed for chest pain. 07/29/18  Yes [name], MD  sertraline (ZOLOFT) 100 MG tablet Take 1 tablet (100 mg total) by mouth daily. 09/03/20  Yes [name], MD  Trospium Chloride 60 MG CP24 TAKE ONE CAPSULE EACH MORNING BEFORE BREAKFAST 09/28/20  Yes [name], MD  vitamin C (ASCORBIC ACID) 500 MG tablet Take 1 tablet (500 mg total) by mouth 2 (two) times daily. 05/04/17  Yes [name], MD  glucose blood (BAYER CONTOUR TEST) test strip Use as instructed 03/26/18   [name], MD    Allergies as of 07/21/2020 - Review Complete 06/03/2020  Allergen Reaction Noted  . Asa [aspirin] Anaphylaxis 05/26/2017  . Aspirin Swelling 09/23/2010  . Atropine Other (See Comments) 09/23/2010  . Belladonna alkaloids Other (See Comments) 09/23/2010  . Codeine Other (See Comments) 09/23/2010  . Darvon Other (See Comments) 09/23/2010  . Darvon [propoxyphene] Nausea And Vomiting 05/26/2017  . Demerol [meperidine] Nausea And Vomiting 05/11/2017  . Ferrous sulfate Diarrhea 05/26/2017  . Meperidine and related  05/26/2017  . Methocarbamol Other (See Comments) 07/31/2013  . Penicillins Other (See Comments) 05/26/2017  . Sulfa antibiotics Other (See Comments) 08/30/2016  . Iron Other (See Comments) 04/29/2013    Family History  Problem Relation Age of Onset  . Breast cancer Mother 11  . Colon cancer Father   . Stroke Sister   . Cancer Mother   . Breast cancer Sister 30  . Colon cancer Brother   . Colon cancer Brother     Social History   Socioeconomic History  . Marital status: Widowed    Spouse name: Not on file  . Number of children: 4  . Years of education: 72  . Highest education level: High school graduate  Occupational History  . Occupation: retired  Tobacco Use  . Smoking status: Never Smoker  . Smokeless tobacco: Never Used  Vaping Use  . Vaping Use: Never used  Substance and Sexual Activity  . Alcohol use: No  . Drug use: No  . Sexual activity: Never  Other Topics Concern  . Not on file  Social  History Narrative   ** Merged History Encounter **       Married 4 children Never smoker Denies alcohol use Full Code   Social Determinants of Radio broadcast assistant Strain: Not on file  Food Insecurity: Not on file  Transportation Needs: Not on file  Physical Activity: Not on file  Stress: Not on file  Social Connections: Not on file  Intimate Partner Violence: Not on file    Review of Systems: See HPI, otherwise negative ROS  Physical Exam: BP (!) 183/77   Pulse 74   Temp (!) 97.1 F (36.2 C) (Temporal)   Resp 18   Ht 5\' 1"  (1.549 m)   Wt 62.6 kg   SpO2 97%   BMI 26.07 kg/m  General:   Alert,  pleasant and cooperative in NAD Head:  Normocephalic and atraumatic. Lungs:  Clear to auscultation.    Heart:  Regular rate and rhythm. Gr IV M  Impression/Plan: Maureen Morales is here for ophthalmic surgery.  Risks, benefits, limitations, and alternatives regarding ophthalmic surgery have been reviewed with the patient.  Questions have been answered.  All parties agreeable.   Leandrew Koyanagi, MD  09/29/2020, 8:48 AM

## 2020-09-29 NOTE — Anesthesia Postprocedure Evaluation (Signed)
Anesthesia Post Note  Patient: Maureen Morales  Procedure(s) Performed: CATARACT EXTRACTION PHACO AND INTRAOCULAR LENS PLACEMENT (IOC) RIGHT VISION BLUE 7.87 00:58.7  (Right Eye)     Patient location during evaluation: PACU Anesthesia Type: MAC Level of consciousness: awake and alert Pain management: pain level controlled Vital Signs Assessment: post-procedure vital signs reviewed and stable Respiratory status: spontaneous breathing, nonlabored ventilation, respiratory function stable and patient connected to nasal cannula oxygen Cardiovascular status: stable and blood pressure returned to baseline Postop Assessment: no apparent nausea or vomiting Anesthetic complications: no   No complications documented.  Sinda Du

## 2020-09-29 NOTE — Anesthesia Procedure Notes (Signed)
Procedure Name: MAC Date/Time: 09/29/2020 9:25 AM Performed by: Dionne Bucy, CRNA Pre-anesthesia Checklist: Patient identified, Emergency Drugs available, Suction available, Patient being monitored and Timeout performed Patient Re-evaluated:Patient Re-evaluated prior to induction Oxygen Delivery Method: Nasal cannula Placement Confirmation: positive ETCO2

## 2020-09-29 NOTE — Transfer of Care (Signed)
Immediate Anesthesia Transfer of Care Note  Patient: Maureen Morales  Procedure(s) Performed: CATARACT EXTRACTION PHACO AND INTRAOCULAR LENS PLACEMENT (IOC) RIGHT VISION BLUE 7.87 00:58.7  (Right Eye)  Patient Location: PACU  Anesthesia Type: MAC  Level of Consciousness: awake, alert  and patient cooperative  Airway and Oxygen Therapy: Patient Spontanous Breathing and Patient connected to supplemental oxygen  Post-op Assessment: Post-op Vital signs reviewed, Patient's Cardiovascular Status Stable, Respiratory Function Stable, Patent Airway and No signs of Nausea or vomiting  Post-op Vital Signs: Reviewed and stable  Complications: No complications documented.

## 2020-09-29 NOTE — Anesthesia Preprocedure Evaluation (Signed)
Anesthesia Evaluation  Patient identified by MRN, date of birth, ID band Patient awake    Reviewed: Allergy & Precautions, NPO status , Patient's Chart, lab work & pertinent test results, reviewed documented beta blocker date and time   History of Anesthesia Complications (+) PONV, Family history of anesthesia reaction and history of anesthetic complications  Airway Mallampati: II  TM Distance: >3 FB Neck ROM: Full    Dental  (+) Dental Advisory Given   Pulmonary neg pulmonary ROS,    Pulmonary exam normal breath sounds clear to auscultation       Cardiovascular Exercise Tolerance: Poor hypertension, Pt. on medications and Pt. on home beta blockers + CAD, + Past MI, + Peripheral Vascular Disease and +CHF   Rhythm:Regular Rate:Normal + Systolic murmurs    Neuro/Psych PSYCHIATRIC DISORDERS Anxiety Depression  Neuromuscular disease    GI/Hepatic Neg liver ROS, GERD  ,Rectal stent   Endo/Other  diabetes, Poorly Controlled, Type 2, Oral Hypoglycemic Agents  Renal/GU Renal disease  negative genitourinary   Musculoskeletal  (+) Arthritis ,   Abdominal Normal abdominal exam  (+) - obese,  Abdomen: soft.    Peds  Hematology  (+) Blood dyscrasia, anemia ,   Anesthesia Other Findings   Reproductive/Obstetrics                             Anesthesia Physical  Anesthesia Plan  ASA: IV  Anesthesia Plan: MAC   Post-op Pain Management:    Induction: Intravenous  PONV Risk Score and Plan: 3 and Treatment may vary due to age or medical condition  Airway Management Planned: Nasal Cannula  Additional Equipment:   Intra-op Plan:   Post-operative Plan:   Informed Consent: I have reviewed the patients History and Physical, chart, labs and discussed the procedure including the risks, benefits and alternatives for the proposed anesthesia with the patient or authorized representative who has  indicated his/her understanding and acceptance.     Dental advisory given  Plan Discussed with: CRNA  Anesthesia Plan Comments: (BS 349, will treat with insulin bolus and infusion intraop. Given recent MI, will place arterial line preoperatively. Discussed with daughter at bedside, patient accepting of intubation for procedure, just does not want to be on mechanical ventilation for prolonged period.)        Anesthesia Quick Evaluation  Patient Active Problem List   Diagnosis Date Noted  . Enterococcus UTI 06/18/2020  . Shingles rash 03/04/2020  . Impetigo herpetiformis 03/02/2020  . Urinary incontinence, nocturnal enuresis 01/17/2020  . Elevated ALT measurement 07/17/2019  . Moderate tricuspid valve regurgitation 09/03/2017  . Diastolic dysfunction without heart failure 09/03/2017  . Labile blood pressure   . Stage 3 chronic kidney disease ([place])   . Chronic diastolic congestive heart failure ([place])   . History of right hip hemiarthroplasty   . Diabetes mellitus type 2 in nonobese (HCC)   . AKI (acute kidney injury) ([place])   . Recurrent falls 05/26/2017  . Normocytic anemia 05/26/2017  . CAD (coronary artery disease) 05/26/2017  . Generalized anxiety disorder 09/07/2016  . Unsteady gait 09/07/2016  . Hospital discharge follow-up 09/07/2016  . S/P laparoscopic cholecystectomy 12/15/2013  . Encounter for preventive health examination 11/13/2013  . Spondylosis of cervical spine 01/27/2013  . Anemia, iron deficiency 01/21/2013  . Moderate aortic stenosis by prior echocardiogram 05/06/2012  . Chronic interstitial lung disease ([place]) 07/04/2011  . Hemorrhoids, internal 04/05/2011  . Essential hypertension   .  Type II diabetes mellitus with nephropathy ([place])   . Hyperlipidemia     CBC Latest Ref Rng & Units 08/26/2020 08/20/2020 08/19/2020  WBC 4.0 - 10.5 K/uL 9.0 8.9 8.9  Hemoglobin 12.0 - 15.0 g/dL 10.6(L) 11.7(L) 11.5(L)  Hematocrit 36.0 - 46.0 % 31.8(L) 34.0(L) 34.0(L)   Platelets 150 - 400 K/uL 181 164 153   BMP Latest Ref Rng & Units 09/09/2020 08/26/2020 08/20/2020  Glucose 65 - 99 mg/dL 190(H) 211(H) 228(H)  BUN 10 - 36 mg/dL 27 46(H) 35(H)  Creatinine 0.57 - 1.00 mg/dL 1.15(H) 1.13(H) 1.17(H)  BUN/Creat Ratio 12 - 28 23 - -  Sodium 134 - 144 mmol/L 134 132(L) 138  Potassium 3.5 - 5.2 mmol/L 4.2 4.1 3.7  Chloride 96 - 106 mmol/L 101 105 106  CO2 20 - 29 mmol/L 18(L) 19(L) 22  Calcium 8.7 - 10.3 mg/dL 8.9 8.7(L) 9.1    Risks and benefits of anesthesia discussed at length, patient or surrogate demonstrates understanding. Appropriately NPO. Plan to proceed with anesthesia.  Champ Mungo, MD 09/29/20

## 2020-09-30 ENCOUNTER — Encounter: Payer: Self-pay | Admitting: Ophthalmology

## 2020-09-30 ENCOUNTER — Telehealth: Payer: Self-pay | Admitting: Internal Medicine

## 2020-09-30 ENCOUNTER — Other Ambulatory Visit: Payer: Self-pay | Admitting: Internal Medicine

## 2020-09-30 DIAGNOSIS — N390 Urinary tract infection, site not specified: Secondary | ICD-10-CM

## 2020-09-30 NOTE — Telephone Encounter (Signed)
Maureen Morales will be dropping off a urine specimen this morning;  please allow her to do so.   UA/micr and culture have been ordered

## 2020-10-01 ENCOUNTER — Other Ambulatory Visit: Payer: Self-pay

## 2020-10-01 ENCOUNTER — Other Ambulatory Visit (INDEPENDENT_AMBULATORY_CARE_PROVIDER_SITE_OTHER): Payer: Medicare Other

## 2020-10-01 DIAGNOSIS — N390 Urinary tract infection, site not specified: Secondary | ICD-10-CM | POA: Diagnosis not present

## 2020-10-01 LAB — URINALYSIS, ROUTINE W REFLEX MICROSCOPIC
Bilirubin Urine: NEGATIVE
Hgb urine dipstick: NEGATIVE
Ketones, ur: NEGATIVE
Leukocytes,Ua: NEGATIVE
Nitrite: NEGATIVE
RBC / HPF: NONE SEEN (ref 0–?)
Specific Gravity, Urine: <=1.005 — AB (ref 1.000–1.030)
Total Protein, Urine: NEGATIVE
Urine Glucose: NEGATIVE
Urobilinogen, UA: 0.2 (ref 0.0–1.0)
pH: 5.5 (ref 5.0–8.0)

## 2020-10-02 LAB — URINE CULTURE
MICRO NUMBER:: 11966540
SPECIMEN QUALITY:: ADEQUATE

## 2020-10-04 DIAGNOSIS — H2512 Age-related nuclear cataract, left eye: Secondary | ICD-10-CM | POA: Diagnosis not present

## 2020-10-09 ENCOUNTER — Other Ambulatory Visit: Payer: Self-pay | Admitting: Internal Medicine

## 2020-10-09 MED ORDER — LEVALBUTEROL HCL 0.63 MG/3ML IN NEBU: 0.6300 mg | INHALATION_SOLUTION | Freq: Four times a day (QID) | RESPIRATORY_TRACT | 0 refills | Status: DC | PRN

## 2020-10-09 MED ORDER — BUDESONIDE 0.5 MG/2ML IN SUSP: 0.5000 mg | Freq: Two times a day (BID) | RESPIRATORY_TRACT | 0 refills | Status: DC

## 2020-10-09 MED ORDER — BENZONATATE 200 MG PO CAPS: 200.0000 mg | ORAL_CAPSULE | Freq: Three times a day (TID) | ORAL | 1 refills | Status: DC | PRN

## 2020-10-09 MED ORDER — AZITHROMYCIN 250 MG PO TABS: ORAL_TABLET | ORAL | 0 refills | Status: AC

## 2020-10-12 ENCOUNTER — Other Ambulatory Visit: Payer: Self-pay | Admitting: Internal Medicine

## 2020-10-13 ENCOUNTER — Ambulatory Visit
Admission: RE | Admit: 2020-10-13 | Discharge: 2020-10-13 | Disposition: A | Discharge status: Home / Self Care | Payer: Medicare Other | Attending: Ophthalmology | Admitting: Ophthalmology

## 2020-10-13 ENCOUNTER — Ambulatory Visit: Payer: Medicare Other | Admitting: Anesthesiology

## 2020-10-13 ENCOUNTER — Encounter
Admission: RE | Disposition: A | Discharge status: Home / Self Care | Payer: Self-pay | Source: Home / Self Care | Attending: Ophthalmology

## 2020-10-13 ENCOUNTER — Encounter: Payer: Self-pay | Admitting: Ophthalmology

## 2020-10-13 ENCOUNTER — Other Ambulatory Visit: Payer: Self-pay

## 2020-10-13 DIAGNOSIS — H25812 Combined forms of age-related cataract, left eye: Secondary | ICD-10-CM | POA: Diagnosis not present

## 2020-10-13 DIAGNOSIS — Z96641 Presence of right artificial hip joint: Secondary | ICD-10-CM | POA: Insufficient documentation

## 2020-10-13 DIAGNOSIS — Z885 Allergy status to narcotic agent status: Secondary | ICD-10-CM | POA: Insufficient documentation

## 2020-10-13 DIAGNOSIS — Z7951 Long term (current) use of inhaled steroids: Secondary | ICD-10-CM | POA: Insufficient documentation

## 2020-10-13 DIAGNOSIS — Z7984 Long term (current) use of oral hypoglycemic drugs: Secondary | ICD-10-CM | POA: Diagnosis not present

## 2020-10-13 DIAGNOSIS — Z882 Allergy status to sulfonamides status: Secondary | ICD-10-CM | POA: Diagnosis not present

## 2020-10-13 DIAGNOSIS — Z8616 Personal history of COVID-19: Secondary | ICD-10-CM | POA: Diagnosis not present

## 2020-10-13 DIAGNOSIS — Z79899 Other long term (current) drug therapy: Secondary | ICD-10-CM | POA: Insufficient documentation

## 2020-10-13 DIAGNOSIS — E1136 Type 2 diabetes mellitus with diabetic cataract: Secondary | ICD-10-CM | POA: Insufficient documentation

## 2020-10-13 DIAGNOSIS — Z886 Allergy status to analgesic agent status: Secondary | ICD-10-CM | POA: Diagnosis not present

## 2020-10-13 DIAGNOSIS — H2512 Age-related nuclear cataract, left eye: Secondary | ICD-10-CM | POA: Insufficient documentation

## 2020-10-13 DIAGNOSIS — Z7902 Long term (current) use of antithrombotics/antiplatelets: Secondary | ICD-10-CM | POA: Insufficient documentation

## 2020-10-13 DIAGNOSIS — Z888 Allergy status to other drugs, medicaments and biological substances status: Secondary | ICD-10-CM | POA: Insufficient documentation

## 2020-10-13 DIAGNOSIS — E1151 Type 2 diabetes mellitus with diabetic peripheral angiopathy without gangrene: Secondary | ICD-10-CM | POA: Diagnosis not present

## 2020-10-13 HISTORY — PX: CATARACT EXTRACTION W/PHACO: SHX586

## 2020-10-13 SURGERY — PHACOEMULSIFICATION, CATARACT, WITH IOL INSERTION
Anesthesia: Monitor Anesthesia Care | Site: Eye | Laterality: Left

## 2020-10-13 MED ORDER — TRYPAN BLUE 0.06 % OP SOLN
OPHTHALMIC | Status: DC | PRN
  Administered 2020-10-13: 0.5 mL via INTRAOCULAR

## 2020-10-13 MED ORDER — LIDOCAINE HCL (PF) 2 % IJ SOLN
INTRAOCULAR | Status: DC | PRN
  Administered 2020-10-13: 1 mL

## 2020-10-13 MED ORDER — MOXIFLOXACIN HCL 0.5 % OP SOLN
OPHTHALMIC | Status: DC | PRN
  Administered 2020-10-13: 0.2 mL via OPHTHALMIC

## 2020-10-13 MED ORDER — TETRACAINE HCL 0.5 % OP SOLN
1.0000 [drp] | OPHTHALMIC | Status: DC | PRN
  Administered 2020-10-13 (×3): 1 [drp] via OPHTHALMIC

## 2020-10-13 MED ORDER — LACTATED RINGERS IV SOLN: INTRAVENOUS | Status: DC

## 2020-10-13 MED ORDER — EPINEPHRINE PF 1 MG/ML IJ SOLN
INTRAOCULAR | Status: DC | PRN
  Administered 2020-10-13: 86 mL via OPHTHALMIC

## 2020-10-13 MED ORDER — MIDAZOLAM HCL 2 MG/2ML IJ SOLN
INTRAMUSCULAR | Status: DC | PRN
  Administered 2020-10-13: .5 mg via INTRAVENOUS

## 2020-10-13 MED ORDER — BRIMONIDINE TARTRATE-TIMOLOL 0.2-0.5 % OP SOLN
OPHTHALMIC | Status: DC | PRN
  Administered 2020-10-13: 1 [drp] via OPHTHALMIC

## 2020-10-13 MED ORDER — NA HYALUR & NA CHOND-NA HYALUR 0.4-0.35 ML IO KIT
PACK | INTRAOCULAR | Status: DC | PRN
  Administered 2020-10-13: 1 mL via INTRAOCULAR

## 2020-10-13 MED ORDER — ARMC OPHTHALMIC DILATING DROPS
1.0000 "application " | OPHTHALMIC | Status: DC | PRN
  Administered 2020-10-13 (×3): 1 via OPHTHALMIC

## 2020-10-13 MED ORDER — ONDANSETRON HCL 4 MG/2ML IJ SOLN
INTRAMUSCULAR | Status: DC | PRN
  Administered 2020-10-13: 4 mg via INTRAVENOUS

## 2020-10-13 SURGICAL SUPPLY — 24 items
CANNULA ANT/CHMB 27GA (MISCELLANEOUS) ×3 IMPLANT
GLOVE SURG ENC TEXT LTX SZ7.5 (GLOVE) ×6 IMPLANT
GLOVE SURG TRIUMPH 8.0 PF LTX (GLOVE) ×3 IMPLANT
GOWN STRL REUS W/ TWL LRG LVL3 (GOWN DISPOSABLE) ×2 IMPLANT
GOWN STRL REUS W/TWL LRG LVL3 (GOWN DISPOSABLE) ×6
LENS IOL TECNIS EYHANCE 25.0 (Intraocular Lens) ×3 IMPLANT
MARKER SKIN DUAL TIP RULER LAB (MISCELLANEOUS) ×3 IMPLANT
NDL RETROBULBAR .5 NSTRL (NEEDLE) IMPLANT
NEEDLE CAPSULORHEX 25GA (NEEDLE) ×3 IMPLANT
NEEDLE FILTER BLUNT 18X 1/2SAF (NEEDLE) ×4
NEEDLE FILTER BLUNT 18X1 1/2 (NEEDLE) ×2 IMPLANT
PACK CATARACT BRASINGTON (MISCELLANEOUS) ×3 IMPLANT
PACK EYE AFTER SURG (MISCELLANEOUS) ×3 IMPLANT
PACK OPTHALMIC (MISCELLANEOUS) ×3 IMPLANT
RING MALYGIN 7.0 (MISCELLANEOUS) IMPLANT
SOLUTION OPHTHALMIC SALT (MISCELLANEOUS) ×3 IMPLANT
SUT ETHILON 10-0 CS-B-6CS-B-6 (SUTURE)
SUT VICRYL  9 0 (SUTURE)
SUT VICRYL 9 0 (SUTURE) IMPLANT
SUTURE EHLN 10-0 CS-B-6CS-B-6 (SUTURE) IMPLANT
SYR 3ML LL SCALE MARK (SYRINGE) ×6 IMPLANT
SYR TB 1ML LUER SLIP (SYRINGE) ×3 IMPLANT
WATER STERILE IRR 250ML POUR (IV SOLUTION) ×3 IMPLANT
WIPE NON LINTING 3.25X3.25 (MISCELLANEOUS) ×3 IMPLANT

## 2020-10-13 NOTE — Transfer of Care (Signed)
Immediate Anesthesia Transfer of Care Note  Patient: Maureen Morales  Procedure(s) Performed: CATARACT EXTRACTION PHACO AND INTRAOCULAR LENS PLACEMENT (IOC) LEFT VISION BLUE (Left: Eye)  Patient Location: PACU  Anesthesia Type: MAC  Level of Consciousness: awake, alert  and patient cooperative  Airway and Oxygen Therapy: Patient Spontanous Breathing and Patient connected to supplemental oxygen  Post-op Assessment: Post-op Vital signs reviewed, Patient's Cardiovascular Status Stable, Respiratory Function Stable, Patent Airway and No signs of Nausea or vomiting  Post-op Vital Signs: Reviewed and stable  Complications: No notable events documented.

## 2020-10-13 NOTE — Anesthesia Postprocedure Evaluation (Signed)
Anesthesia Post Note  Patient: Maureen Morales  Procedure(s) Performed: CATARACT EXTRACTION PHACO AND INTRAOCULAR LENS PLACEMENT (IOC) LEFT VISION BLUE (Left: Eye)     Patient location during evaluation: PACU Anesthesia Type: MAC Level of consciousness: awake and alert Pain management: pain level controlled Vital Signs Assessment: post-procedure vital signs reviewed and stable Respiratory status: spontaneous breathing, nonlabored ventilation, respiratory function stable and patient connected to nasal cannula oxygen Cardiovascular status: stable and blood pressure returned to baseline Postop Assessment: no apparent nausea or vomiting Anesthetic complications: no   No notable events documented.  Trecia Rogers

## 2020-10-13 NOTE — H&P (Signed)
[facility]   Primary Care Physician:  [name], MD Ophthalmologist: Dr. Leandrew Koyanagi  Pre-Procedure History & Physical: HPI:  Maureen Morales is a 87 y.o. female here for ophthalmic surgery.   Past Medical History:  Diagnosis Date   Aortic stenosis    a. 08/2016 Echo: Hyperdynamic LV fxn, mild AS (mean grad 32mmHg), mild MR, mild PAH; b. 05/2017 Echo: Hyperdynamic LV fxn, mod AS (mean grad 74mmHg, valve area 1.84). PASP 11mmHg.   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 08/2017 Renal U/S: No RAS.   Low back pain with left-sided sciatica 04/29/2018   Myocardial infarction [facility]) 2018   Non-obstructive CAD (coronary artery disease)    a. 08/2016 NSTEMI/Cath: mild to mod nonobs dzs including 60% mRCA stenosis-->Med rx.   NSTEMI (non-ST elevated myocardial infarction) ([place]) 08/30/2016   NSTEMI (non-ST elevated myocardial infarction) ([place]) 08/18/2020   Nummular eczema 01/16/2011   Occurring on chest and under breast.  Treated by Dr Evorn Gong with fluocinonide 0.05% topical 06/26/12    Peripheral vascular disease ([place])    Periprosthetic fracture around internal prosthetic hip joint, initial encounter    PONV (postoperative nausea and vomiting)    Positive H. pylori test    Pre-diabetes    Suspected COVID-19 virus infection 01/10/2019   Patient reporting malaise and headache for the past 3 days,  Similar to presentation when she was positive for  COVID 19 INFECTION  July 2 .  Previous inection was caused by exposure to COVID positive  caregiver.  Repeat scenario:  Same caregiver's mother tested positive on Monday and caregiver saw current patient on Tuesday     Viral gastroenteritis due to [place]-like agent Nov 2012    Past Surgical History:  Procedure Laterality Date   ABDOMINAL HYSTERECTOMY     BLADDER SURGERY     CARDIAC CATHETERIZATION     CATARACT EXTRACTION W/PHACO Right 09/29/2020   Procedure: CATARACT EXTRACTION PHACO AND INTRAOCULAR LENS PLACEMENT ([place]) RIGHT VISION BLUE 7.87 00:58.7 ;  Surgeon: Leandrew Koyanagi, MD;  Location: [place];  Service: Ophthalmology;  Laterality: Right;   CHOLECYSTECTOMY     COLONOSCOPY     06/16/1987, 02/20/1995, 05/04/1999, 02/14/2005, 05/17/2007   ESOPHAGOGASTRODUODENOSCOPY     05/11/1987, 06/13/1995, 10/09/1995, 05/04/1999, 01/16/2002   FLEXIBLE SIGMOIDOSCOPY N/A 05/02/2017   Procedure: Otho Darner SIGMOIDOSCOPY;  Surgeon: Manya Silvas, MD;  Location: [facility] ENDOSCOPY;  Service: Endoscopy;  Laterality: N/A;   HEMORRHOID SURGERY N/A 05/14/2017   Procedure: EXTERNAL THROMBOSED HEMORRHOIDECTOMY;  Surgeon: Robert Bellow, MD;  Location: [facility] ORS;  Service: General;  Laterality: N/A;   HIP ARTHROPLASTY Right 02/17/2017   Procedure: ARTHROPLASTY BIPOLAR HIP (HEMIARTHROPLASTY);  Surgeon: Claud Kelp, MD;  Location: [facility] ORS;  Service: Orthopedics;  Laterality: Right;   LEFT HEART CATH AND CORONARY ANGIOGRAPHY N/A 08/31/2016   Procedure: Left Heart Cath and Coronary Angiography;  Surgeon: Wellington Hampshire, MD;  Location: [place] CV LAB;  Service: Cardiovascular;  Laterality: N/A;   ORIF PERIPROSTHETIC FRACTURE Right 05/27/2017   ORIF PERIPROSTHETIC FRACTURE Right 05/27/2017   Procedure: OPEN REDUCTION INTERNAL FIXATION (ORIF) PERIPROSTHETIC FRACTURE;  Surgeon: Mcarthur Rossetti, MD;  Location: [place];  Service: Orthopedics;  Laterality: Right;   TOTAL HIP ARTHROPLASTY Right    uterian prolapse      Prior to Admission medications   Medication Sig Start Date  End Date Taking? Authorizing Provider  acetaminophen (TYLENOL) 500 MG tablet Take 1,000 mg by mouth every 6 (six) hours as needed for moderate pain or headache.   Yes [provider]  albuterol (VENTOLIN HFA) 108 (90 Base) MCG/ACT inhaler Inhale 2 puffs into the lungs every 6 (six) hours as needed for wheezing or shortness of breath. 02/13/19  Yes Tyler Pita, MD  atorvastatin (LIPITOR) 40 MG tablet TAKE 1 TABLET BY MOUTH DAILY 09/01/20  Yes [name], MD  azithromycin (ZITHROMAX) 250 MG tablet Take 2 tablets on day 1, then 1 tablet daily on days 2 through 5 10/09/20 10/14/20 Yes [name], MD  benzonatate (TESSALON) 200 MG capsule Take 1 capsule (200 mg total) by mouth 3 (three) times daily as needed for cough. 10/09/20  Yes [name], MD  budesonide (PULMICORT) 0.5 MG/2ML nebulizer solution Take 2 mLs (0.5 mg total) by nebulization every 12 (twelve) hours. 10/09/20  Yes [name], MD  Carboxymethylcellul-Glycerin (LUBRICATING EYE DROPS OP) Place 1 drop into both eyes daily as needed (dry eyes).   Yes [provider]  clopidogrel (PLAVIX) 75 MG tablet TAKE 1 TABLET BY MOUTH DAILY. 10/12/20  Yes [name], MD  famotidine (PEPCID) 10 MG tablet Take 10 mg by mouth daily as needed for heartburn or indigestion.   Yes [provider]  glipiZIDE (GLUCOTROL) 5 MG tablet TAKE 1 TABLET BY MOUTH TWICE DAILY BEFORE A MEAL 08/06/20  Yes [name], MD  glucose blood (BAYER CONTOUR TEST) test strip Use as instructed 03/26/18  Yes [name], MD  hydrALAZINE (APRESOLINE) 50 MG tablet Take 50-75 mg by mouth See admin instructions. Take 75 mg with breakfast, 75 mg with lunch, and 50 mg with dinner   Yes [provider]  hydrochlorothiazide (MICROZIDE) 12.5 MG capsule TAKE 1 CAPSULE BY MOUTH EVERY DAY 08/12/20  Yes [name], MD  isosorbide mononitrate (IMDUR) 30 MG 24 hr tablet TAKE ONE TABLET BY MOUTH EVERY MORNING AND TAKE TWO TABLETS  EVERY EVENING 09/15/20  Yes Dunn, Areta Haber, PA-C  Lactobacillus (ULTIMATE PROBIOTIC FORMULA) CAPS Take 1 capsule by mouth daily with supper.   Yes [provider]  lansoprazole (PREVACID) 30 MG capsule Take 30 mg by mouth daily as needed.   Yes [provider]  levalbuterol (XOPENEX) 0.63 MG/3ML nebulizer solution Take 3 mLs (0.63 mg total) by nebulization every 6 (six) hours as needed for wheezing or shortness of breath. 10/09/20  Yes [name], MD  loratadine (CLARITIN) 10 MG tablet Take 10 mg daily by mouth.    Yes [provider]  losartan (COZAAR) 100 MG tablet TAKE ONE TABLET BY MOUTH EVERY DAY 09/15/20  Yes [name], MD  nitroGLYCERIN (NITROSTAT) 0.4 MG SL tablet Place 1 tablet (0.4 mg total) under the tongue every 5 (five) minutes as needed for chest pain. 07/29/18  Yes [name], MD  sertraline (ZOLOFT) 100 MG tablet Take 1 tablet (100 mg total) by mouth daily. 09/03/20  Yes [name], MD  Trospium Chloride 60 MG CP24 TAKE ONE CAPSULE EACH MORNING BEFORE BREAKFAST 09/28/20  Yes [name], MD  vitamin C (ASCORBIC ACID) 500 MG tablet Take 1 tablet (500 mg total) by mouth 2 (two) times daily. 05/04/17  Yes [name], MD  carvedilol (COREG) 12.5 MG tablet Take 0.5 tablets (6.25 mg total) by mouth 2 (two) times daily with a meal. 08/28/20 09/27/20  [name], MD    Allergies as of 07/21/2020 - Review Complete 06/03/2020  Allergen Reaction Noted   Asa [aspirin] Anaphylaxis 05/26/2017   Aspirin Swelling 09/23/2010   Atropine Other (See Comments) 09/23/2010   Belladonna alkaloids Other (See Comments) 09/23/2010   Codeine Other (See Comments) 09/23/2010   Darvon Other (See Comments) 09/23/2010   Darvon [propoxyphene] Nausea And Vomiting 05/26/2017   Demerol [meperidine] Nausea And Vomiting 05/11/2017   Ferrous sulfate Diarrhea 05/26/2017   Meperidine and related  05/26/2017   Methocarbamol Other (See Comments) 07/31/2013   Penicillins  Other (See Comments) 05/26/2017   Sulfa antibiotics Other (See Comments) 08/30/2016   Iron Other (See Comments) 04/29/2013    Family History  Problem Relation Age of Onset   Breast cancer Mother 47   Colon cancer Father    Stroke Sister    Cancer Mother    Breast cancer Sister 6   Colon cancer Brother    Colon cancer Brother     Social History   Socioeconomic History   Marital status: Widowed    Spouse name: Not on file   Number of children: 4   Years of education: 12   Highest education level: High school graduate  Occupational History   Occupation: retired  Tobacco Use   Smoking status: Never   Smokeless tobacco: Never  Vaping Use   Vaping Use: Never used  Substance and Sexual Activity   Alcohol use: No   Drug use: No   Sexual activity: Never  Other Topics Concern   Not on file  Social History Narrative   ** Merged History Encounter **       Married 4 children Never smoker Denies alcohol use Full Code   Social Determinants of Radio broadcast assistant Strain: Not on file  Food Insecurity: Not on file  Transportation Needs: Not on file  Physical Activity: Not on file  Stress: Not on file  Social Connections: Not on file  Intimate Partner Violence: Not on file    Review of Systems: See HPI, otherwise negative ROS  Physical Exam: BP (!) 179/82   Pulse 78   Temp 98 F (36.7 C) (Temporal)   Ht 5\' 1"  (1.549 m)   Wt 62.6 kg   SpO2 98%   BMI 26.08 kg/m  General:   Alert,  pleasant and cooperative in NAD Head:  Normocephalic and atraumatic. Lungs:  Clear to auscultation.    Heart:  Regular rate and rhythm. Gr 4 Murmur  Impression/Plan: TATELYN VANHECKE is here for ophthalmic surgery.  Risks, benefits, limitations, and alternatives regarding ophthalmic surgery have been reviewed with the patient.  Questions have been answered.  All parties agreeable.   Leandrew Koyanagi, MD  10/13/2020, 9:14 AM

## 2020-10-13 NOTE — Op Note (Signed)
  OPERATIVE NOTE  Maureen Morales [id number] 10/13/2020   PREOPERATIVE DIAGNOSIS:  Nuclear sclerotic cataract left eye. H25.12   POSTOPERATIVE DIAGNOSIS:    Nuclear sclerotic cataract left eye.     PROCEDURE:  Phacoemusification with posterior chamber intraocular lens placement of the left eye  Vision blue was used to stain the lens capsule   Procedure(s) with comments: CATARACT EXTRACTION PHACO AND INTRAOCULAR LENS PLACEMENT (IOC) LEFT VISION BLUE (Left) - 4.94 1:03.0 7.9%  LENS:   Implant Name Type Inv. Item Serial No. Manufacturer Lot No. LRB No. Used Action  LENS IOL TECNIS EYHANCE 25.0 - [id number] Intraocular Lens LENS IOL TECNIS EYHANCE 25.0 [id number] JOHNSON   Left 1 Implanted      SURGEON:  Wyonia Hough, MD   ANESTHESIA:  Topical with tetracaine drops and 2% Xylocaine jelly, augmented with 1% preservative-free intracameral lidocaine.    COMPLICATIONS:  None.   DESCRIPTION OF PROCEDURE:  The patient was identified in the holding room and transported to the operating room and placed in the supine position under the operating microscope.  The left eye was identified as the operative eye and it was prepped and draped in the usual sterile ophthalmic fashion.   A 1 millimeter clear-corneal paracentesis was made at the 1:30 position.  0.5 ml of preservative-free 1% lidocaine was injected into the anterior chamber. Vision Blue was injected into the anterior chamber.  The anterior chamber was filled with Viscoat viscoelastic.  A 2.4 millimeter keratome was used to make a near-clear corneal incision at the 10:30 position.  .  A curvilinear capsulorrhexis was made with a cystotome and capsulorrhexis forceps.  Balanced salt solution was used to hydrodissect and hydrodelineate the nucleus.   Phacoemulsification was then used in stop and chop fashion to remove the lens nucleus and epinucleus.  The remaining cortex was then removed using the irrigation and aspiration handpiece.  Provisc was then placed into the capsular bag to distend it for lens placement.  A lens was then injected into the capsular bag.  The remaining viscoelastic was aspirated.   Wounds were hydrated with balanced salt solution.  The anterior chamber was inflated to a physiologic pressure with balanced salt solution.  No wound leaks were noted. Vigamox 0.2 ml of a 1mg  per ml solution was injected into the anterior chamber for a dose of 0.2 mg of intracameral antibiotic at the completion of the case.   Timolol and Brimonidine drops were applied to the eye.  The patient was taken to the recovery room in stable condition without complications of anesthesia or surgery.  Roshard Rezabek 10/13/2020, 10:01 AM

## 2020-10-13 NOTE — Anesthesia Preprocedure Evaluation (Signed)
Anesthesia Evaluation  Patient identified by MRN, date of birth, ID band Patient awake    Reviewed: Allergy & Precautions, H&P , NPO status , Patient's Chart, lab work & pertinent test results, reviewed documented beta blocker date and time   History of Anesthesia Complications (+) PONV and history of anesthetic complications  Airway Mallampati: II  TM Distance: >3 FB Neck ROM: full    Dental no notable dental hx.    Pulmonary asthma ,    Pulmonary exam normal breath sounds clear to auscultation       Cardiovascular Exercise Tolerance: Good hypertension, + CAD, + Past MI, + Peripheral Vascular Disease and +CHF  Normal cardiovascular exam Rhythm:regular Rate:Normal     Neuro/Psych Anxiety Depression negative neurological ROS  negative psych ROS   GI/Hepatic Neg liver ROS, GERD  ,  Endo/Other  diabetes, Type 2  Renal/GU CRFRenal disease  negative genitourinary   Musculoskeletal   Abdominal   Peds  Hematology negative hematology ROS (+)   Anesthesia Other Findings   Reproductive/Obstetrics negative OB ROS                             Anesthesia Physical Anesthesia Plan  ASA: 3  Anesthesia Plan: MAC   Post-op Pain Management:    Induction:   PONV Risk Score and Plan:   Airway Management Planned:   Additional Equipment:   Intra-op Plan:   Post-operative Plan:   Informed Consent: I have reviewed the patients History and Physical, chart, labs and discussed the procedure including the risks, benefits and alternatives for the proposed anesthesia with the patient or authorized representative who has indicated his/her understanding and acceptance.     Dental Advisory Given  Plan Discussed with: CRNA and Anesthesiologist  Anesthesia Plan Comments:         Anesthesia Quick Evaluation

## 2020-10-14 ENCOUNTER — Encounter: Payer: Self-pay | Admitting: Ophthalmology

## 2020-11-12 ENCOUNTER — Other Ambulatory Visit: Payer: Self-pay | Admitting: Internal Medicine

## 2020-12-06 ENCOUNTER — Other Ambulatory Visit: Payer: Self-pay

## 2020-12-06 MED ORDER — HYDRALAZINE HCL 50 MG PO TABS: 50.0000 mg | ORAL_TABLET | ORAL | 1 refills | Status: DC

## 2020-12-07 ENCOUNTER — Other Ambulatory Visit: Payer: Self-pay | Admitting: Physician Assistant

## 2020-12-07 ENCOUNTER — Other Ambulatory Visit: Payer: Self-pay | Admitting: Internal Medicine

## 2020-12-07 ENCOUNTER — Other Ambulatory Visit: Payer: Self-pay | Admitting: Cardiovascular Disease

## 2020-12-07 ENCOUNTER — Telehealth: Payer: Self-pay

## 2020-12-07 ENCOUNTER — Telehealth: Payer: Self-pay | Admitting: Internal Medicine

## 2020-12-07 ENCOUNTER — Other Ambulatory Visit (INDEPENDENT_AMBULATORY_CARE_PROVIDER_SITE_OTHER): Payer: Medicare Other

## 2020-12-07 DIAGNOSIS — R350 Frequency of micturition: Secondary | ICD-10-CM

## 2020-12-07 NOTE — Telephone Encounter (Signed)
Called patient.  No answer. LMOV to call back.   Patient has been receiving Carvedilol 12.5 MG -- Take half tablet by mouth twice a day.  Wanting to verify with patient if she would like to continue cutting medication or if she would like me to send in a 6.'25MG'$  tablet.

## 2020-12-07 NOTE — Telephone Encounter (Signed)
You Just now (9:55 AM)    Called patient. No answer. LMOV to call back.   Patient has been receiving Carvedilol 12.5 MG -- Take half tablet by mouth twice a day. Wanting to verify with patient if she would like to continue cutting medication or if she would like me to send in a 6.'25MG'$  tablet.

## 2020-12-07 NOTE — Telephone Encounter (Signed)
Maureen Morales's family will be dropping off a urine specimen today.  They may need to come by for a clean container

## 2020-12-07 NOTE — Telephone Encounter (Signed)
Noted...caregiver came by to get specimen cups.

## 2020-12-07 NOTE — Telephone Encounter (Signed)
Spoke with patients daughter -- Maureen Morales per [place].  Patients daughter agreed that we should should sent in the 6.25 tablets so there will be no cutting pills.  Made her aware that I will send this medication in to the Pharmacy preference on file.

## 2020-12-08 LAB — URINALYSIS, ROUTINE W REFLEX MICROSCOPIC
Bilirubin Urine: NEGATIVE
Hgb urine dipstick: NEGATIVE
Ketones, ur: NEGATIVE
Leukocytes,Ua: NEGATIVE
Nitrite: NEGATIVE
RBC / HPF: NONE SEEN (ref 0–?)
Specific Gravity, Urine: 1.01 (ref 1.000–1.030)
Total Protein, Urine: NEGATIVE
Urine Glucose: NEGATIVE
Urobilinogen, UA: 0.2 (ref 0.0–1.0)
pH: 5.5 (ref 5.0–8.0)

## 2020-12-08 LAB — URINE CULTURE
MICRO NUMBER:: 12219585
SPECIMEN QUALITY:: ADEQUATE

## 2020-12-13 ENCOUNTER — Other Ambulatory Visit: Payer: Self-pay | Admitting: Physician Assistant

## 2020-12-13 ENCOUNTER — Other Ambulatory Visit: Payer: Self-pay | Admitting: Internal Medicine

## 2020-12-14 ENCOUNTER — Ambulatory Visit (INDEPENDENT_AMBULATORY_CARE_PROVIDER_SITE_OTHER): Payer: Medicare Other | Admitting: Cardiovascular Disease

## 2020-12-14 ENCOUNTER — Encounter: Payer: Self-pay | Admitting: Cardiovascular Disease

## 2020-12-14 ENCOUNTER — Other Ambulatory Visit: Payer: Medicare Other

## 2020-12-14 ENCOUNTER — Other Ambulatory Visit: Payer: Self-pay

## 2020-12-14 VITALS — BP 158/60 | HR 77 | Ht 61.0 in | Wt 139.5 lb

## 2020-12-14 DIAGNOSIS — I5032 Chronic diastolic (congestive) heart failure: Secondary | ICD-10-CM

## 2020-12-14 DIAGNOSIS — E785 Hyperlipidemia, unspecified: Secondary | ICD-10-CM | POA: Diagnosis not present

## 2020-12-14 DIAGNOSIS — I2510 Atherosclerotic heart disease of native coronary artery without angina pectoris: Secondary | ICD-10-CM | POA: Diagnosis not present

## 2020-12-14 DIAGNOSIS — I10 Essential (primary) hypertension: Secondary | ICD-10-CM

## 2020-12-14 DIAGNOSIS — I350 Nonrheumatic aortic (valve) stenosis: Secondary | ICD-10-CM | POA: Diagnosis not present

## 2020-12-14 DIAGNOSIS — I25118 Atherosclerotic heart disease of native coronary artery with other forms of angina pectoris: Secondary | ICD-10-CM

## 2020-12-14 NOTE — Patient Instructions (Signed)

## 2020-12-14 NOTE — Progress Notes (Signed)
Cardiology Office Note   Date:  12/14/2020   ID:  Maureen Morales, DOB October 23, 1922, MRN [id number]  PCP:  [name], MD  Cardiologist:   [name], MD   Chief Complaint  Patient presents with   Other    3 month f/u no complaints today. Meds reviewed verbally with pt.       History of Present Illness: Maureen Morales is a 87 y.o. female who presents for a follow up regarding aortic stenosis and coronary artery disease. She has chronic medical conditions that include hypertension, hyperlipidemia and type 2 diabetes.  She was hospitalized in May of 2018 with non-ST elevation myocardial infarction in the setting of uncontrolled hypertension. Troponin peaked at 5.79. Echocardiogram showed hyperdynamic LV systolic function, mild aortic stenosis with mean gradient of 14 mmHg, mild mitral regurgitation, mild pulmonary hypertension and trivial posterior pericardial effusion. Cardiac catheterization showed mild to moderate nonobstructive coronary artery disease. Worst stenosis was 60% in the mid right coronary artery. Outpatient renal artery duplex showed no evidence of renal artery stenosis. She had right hip fracture in 2018 after a fall and required another surgery after another fall.    She has difficult to control blood pressure with intolerance to antihypertensive medications.  She was hospitalized in April of this year with non-ST elevation myocardial infarction in the setting of hypertensive urgency.  Peak troponin was 1663.  Echocardiogram  showed an ejection fraction of 60 to 65%, moderate left ventricular hypertrophy, grade 1 diastolic dysfunction, mild to moderate mitral regurgitation and mild to moderate aortic stenosis with mean gradient of 16 mmHg and valve area of 1.7 cm.  Medical therapy was recommended with blood pressure control.  The dose of carvedilol was increased to 12.5 mg twice daily and Imdur was increased as well. She underwent cataract surgery.  She has  been doing well with no recent chest pain, shortness of breath or palpitations.  She recently had significantly elevated blood pressure in the setting of back pain but responded to extra doses of hydralazine.  Past Medical History:  Diagnosis Date   Aortic stenosis    a. 08/2016 Echo: Hyperdynamic LV fxn, mild AS (mean grad 63mHg), mild MR, mild PAH; b. 05/2017 Echo: Hyperdynamic LV fxn, mod AS (mean grad 235mg, valve area 1.84). PASP 10028m.   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 08/2017 Renal U/S: No RAS.   Low back pain with left-sided sciatica 04/29/2018   [facility]   Non-obstructive CAD (coronary artery disease)    a. 08/2016 NSTEMI/Cath: mild to mod nonobs dzs including 60% mRCA stenosis-->Med rx.   NSTEMI (non-ST elevated myocardial infarction) ([place]/06/2016   NSTEMI (non-ST elevated myocardial infarction) ([place]/20/2022   Nummular eczema 01/16/2011   Occurring on chest and under breast.  Treated by Dr DasEvorn Gongth fluocinonide 0.05% topical 06/26/12    Peripheral vascular disease ([facility]  Periprosthetic fracture around internal prosthetic  hip joint, initial encounter    PONV (postoperative nausea and vomiting)    Positive H. pylori test    Pre-diabetes    Suspected COVID-19 virus infection 01/10/2019   Patient reporting malaise and headache for the past 3 days,  Similar to presentation when she was positive for  COVID 19 INFECTION  July 2 .  Previous inection was caused by exposure to COVID positive caregiver.   Repeat scenario:  Same caregiver's mother tested positive on Monday and caregiver saw current patient on Tuesday     Viral gastroenteritis due to [place]-like agent Nov 2012    Past Surgical History:  Procedure Laterality Date   ABDOMINAL HYSTERECTOMY     BLADDER SURGERY     CARDIAC CATHETERIZATION     CATARACT EXTRACTION W/PHACO Right 09/29/2020   Procedure: CATARACT EXTRACTION PHACO AND INTRAOCULAR LENS PLACEMENT ([place]) RIGHT VISION BLUE 7.87 00:58.7 ;  Surgeon: Leandrew Koyanagi, MD;  Location: [place];  Service: Ophthalmology;  Laterality: Right;   CATARACT EXTRACTION W/PHACO Left 10/13/2020   Procedure: CATARACT EXTRACTION PHACO AND INTRAOCULAR LENS PLACEMENT ([place]) LEFT VISION BLUE;  Surgeon: Leandrew Koyanagi, MD;  Location: [place];  Service: Ophthalmology;  Laterality: Left;  4.94 1:03.0 7.9%   CHOLECYSTECTOMY     COLONOSCOPY     06/16/1987, 02/20/1995, 05/04/1999, 02/14/2005, 05/17/2007   ESOPHAGOGASTRODUODENOSCOPY     05/11/1987, 06/13/1995, 10/09/1995, 05/04/1999, 01/16/2002   FLEXIBLE SIGMOIDOSCOPY N/A 05/02/2017   Procedure: Otho Darner SIGMOIDOSCOPY;  Surgeon: Manya Silvas, MD;  Location: [facility] ENDOSCOPY;  Service: Endoscopy;  Laterality: N/A;   HEMORRHOID SURGERY N/A 05/14/2017   Procedure: EXTERNAL THROMBOSED HEMORRHOIDECTOMY;  Surgeon: Robert Bellow, MD;  Location: [facility] ORS;  Service: General;  Laterality: N/A;   HIP ARTHROPLASTY Right 02/17/2017   Procedure: ARTHROPLASTY BIPOLAR HIP (HEMIARTHROPLASTY);  Surgeon: Claud Kelp, MD;  Location: [facility] OR[facility];  Service: Orthopedics;  Laterality: Right;   LEFT HEART CATH AND CORONARY ANGIOGRAPHY N/A 08/31/2016   Procedure: Left Heart Cath and Coronary Angiography;  Surgeon: Wellington Hampshire, MD;  Location: [place] CV LAB;  Service: Cardiovascular;  Laterality: N/A;   ORIF PERIPROSTHETIC FRACTURE Right 05/27/2017   ORIF PERIPROSTHETIC FRACTURE Right 05/27/2017   Procedure: OPEN REDUCTION INTERNAL  FIXATION (ORIF) PERIPROSTHETIC FRACTURE;  Surgeon: Mcarthur Rossetti, MD;  Location: [place];  Service: Orthopedics;  Laterality: Right;   TOTAL HIP ARTHROPLASTY Right    uterian prolapse       Current Outpatient Medications  Medication Sig Dispense Refill   acetaminophen (TYLENOL) 500 MG tablet Take 1,000 mg by mouth every 6 (six) hours as needed for moderate pain or headache.     albuterol (VENTOLIN HFA) 108 (90 Base) MCG/ACT inhaler Inhale 2 puffs into the lungs every 6 (six) hours as needed for wheezing or shortness of breath. 18 g 1   atorvastatin (LIPITOR) 40 MG tablet TAKE 1 TABLET BY MOUTH DAILY 90 tablet 1   benzonatate (TESSALON) 200 MG capsule Take 1 capsule (200 mg total) by mouth 3 (three) times daily as needed for cough. 60 capsule 1   budesonide (PULMICORT) 0.5 MG/2ML nebulizer solution Take 2 mLs (0.5 mg total) by nebulization every 12 (twelve) hours. 60 mL 0   Carboxymethylcellul-Glycerin (LUBRICATING EYE DROPS OP) Place 1 drop into both eyes daily as needed (dry eyes).     carvedilol (COREG) 6.25 MG tablet TAKE ONE TABLET TWICE DAILY WITH A MEAL 60 tablet 0   clopidogrel (PLAVIX) 75 MG tablet TAKE 1 TABLET BY MOUTH DAILY. [place]  tablet 0   famotidine (PEPCID) 10 MG tablet Take 10 mg by mouth daily as needed for heartburn or indigestion.     glipiZIDE (GLUCOTROL) 5 MG tablet TAKE 1 TABLET BY MOUTH TWICE DAILY BEFORE A MEAL 180 tablet 1   glucose blood (BAYER CONTOUR TEST) test strip Use as instructed 100 each 5   hydrALAZINE (APRESOLINE) 50 MG tablet Take 1-1.5 tablets (50-75 mg total) by mouth See admin instructions. Take 75 mg with breakfast, 75 mg with lunch, and 50 mg with dinner 180 tablet 1   hydrochlorothiazide (MICROZIDE) 12.5 MG capsule TAKE 1 CAPSULE BY MOUTH EVERY DAY 30 capsule 6   isosorbide mononitrate (IMDUR) 30 MG 24 hr tablet TAKE ONE TABLET BY MOUTH EVERY MORNING AND TAKE TWO TABLETS EVERY EVENING 270 tablet 0   Lactobacillus (ULTIMATE PROBIOTIC FORMULA) CAPS  Take 1 capsule by mouth daily with supper.     lansoprazole (PREVACID) 30 MG capsule Take 30 mg by mouth daily as needed.     levalbuterol (XOPENEX) 0.63 MG/3ML nebulizer solution Take 3 mLs (0.63 mg total) by nebulization every 6 (six) hours as needed for wheezing or shortness of breath. 120 mL 0   loratadine (CLARITIN) 10 MG tablet Take 10 mg daily by mouth.      losartan (COZAAR) 100 MG tablet TAKE ONE TABLET BY MOUTH EVERY DAY 90 tablet 1   nitroGLYCERIN (NITROSTAT) 0.4 MG SL tablet Place 1 tablet (0.4 mg total) under the tongue every 5 (five) minutes as needed for chest pain. 10 tablet 2   sertraline (ZOLOFT) 100 MG tablet Take 1 tablet (100 mg total) by mouth daily. 30 tablet 3   Trospium Chloride 60 MG CP24 TAKE ONE CAPSULE EACH MORNING BEFORE BREAKFAST 90 capsule 1   vitamin C (ASCORBIC ACID) 500 MG tablet Take 1 tablet (500 mg total) by mouth 2 (two) times daily. 60 tablet 2   No current facility-administered medications for this visit.    Allergies:   Clindamycin, Oxytetracycline, Phenobarbital, Atropine, Belladonna alkaloids, Codeine, Darvon [propoxyphene], Demerol [meperidine], Methocarbamol, Penicillins, Sulfa antibiotics, and Iron    Social History:  The patient  reports that she has never smoked. She has never used smokeless tobacco. She reports that she does not drink alcohol and does not use drugs.   Family History:  The patient's family history includes Breast cancer (age of onset: 26) in her sister; Breast cancer (age of onset: 50) in her mother; Cancer in her mother; Colon cancer in her brother, brother, and father; Stroke in her sister.    ROS:  Please see the history of present illness.   Otherwise, review of systems are positive for none.   All other systems are reviewed and negative.    PHYSICAL EXAM: VS:  BP (!) 158/60 (BP Location: Left Arm, Patient Position: Sitting, Cuff Size: Normal)   Pulse 77   Ht '5\' 1"'$  (1.549 m)   Wt 139 lb 8 oz (63.3 kg)   SpO2 98%   BMI  26.36 kg/m  , BMI Body mass index is 26.36 kg/m. GEN: Well nourished, well developed, in no acute distress  HEENT: normal  Neck: no JVD, carotid bruits, or masses Cardiac: RRR; no rubs, or gallops, mild bilateral leg edema . 3/6 crescendo decrescendo systolic murmur in the aortic area which is mid peaking Respiratory:  clear to auscultation bilaterally, normal work of breathing GI: soft, nontender, nondistended, + BS MS: no deformity or atrophy  Skin: warm and dry, no rash Neuro:  Strength and  sensation are intact Psych: euthymic mood, full affect    EKG:  EKG is ordered today. The ekg ordered today demonstrates normal sinus rhythm with nonspecific T wave changes.   Recent Labs: 08/20/2020: Magnesium 2.2 08/26/2020: ALT 26; Hemoglobin 10.6; Platelets 181 09/09/2020: BUN 27; Creatinine, Ser 1.15; Potassium 4.2; Sodium 134    Lipid Panel    Component Value Date/Time   CHOL 145 01/17/2019 1346   TRIG 314.0 (H) 01/17/2019 1346   HDL 32.80 (L) 01/17/2019 1346   CHOLHDL 4 01/17/2019 1346   VLDL 62.8 (H) 01/17/2019 1346   LDLCALC 68 12/04/2016 0808   LDLDIRECT 48.0 07/16/2019 1012      Wt Readings from Last 3 Encounters:  12/14/20 139 lb 8 oz (63.3 kg)  10/13/20 138 lb 0.1 oz (62.6 kg)  09/29/20 138 lb (62.6 kg)       No flowsheet data found.    ASSESSMENT AND PLAN:  1.  Coronary artery disease involving native coronary arteries with stable angina: She is doing reasonably well overall with controlled symptoms.  Continue medical therapy.    2.  Moderate aortic stenosis.  This was stable on most recent echocardiogram in April.  Repeat echocardiogram in April 2023.   3.  Essential hypertension: Blood pressure is reasonably controlled on current medications   4.  Hyperlipidemia: Continue treatment with atorvastatin.  Most recent lipid profile showed an LDL of 48.  5.  Chronic diastolic heart failure: She appears to be euvolemic.    Disposition:   FU with me in 6  months.  Signed,  [name], MD  12/14/2020 4:02 PM    [facility]

## 2020-12-16 ENCOUNTER — Telehealth: Payer: Self-pay | Admitting: Pulmonary Disease

## 2020-12-16 MED ORDER — ALBUTEROL SULFATE HFA 108 (90 BASE) MCG/ACT IN AERS: 2.0000 | INHALATION_SPRAY | Freq: Four times a day (QID) | RESPIRATORY_TRACT | 3 refills | Status: DC | PRN

## 2020-12-16 NOTE — Telephone Encounter (Signed)
Called and spoke with pharmacy they said they need Rx for patients rescue inhaler. New Rx has been sent to [facility]. Nothing further needed at this time.

## 2021-01-05 ENCOUNTER — Other Ambulatory Visit: Payer: Self-pay | Admitting: Internal Medicine

## 2021-01-05 ENCOUNTER — Other Ambulatory Visit: Payer: Self-pay | Admitting: Physician Assistant

## 2021-01-10 ENCOUNTER — Other Ambulatory Visit: Payer: Self-pay | Admitting: Internal Medicine

## 2021-02-02 ENCOUNTER — Other Ambulatory Visit: Payer: Self-pay | Admitting: Internal Medicine

## 2021-02-15 ENCOUNTER — Other Ambulatory Visit: Payer: Self-pay | Admitting: Internal Medicine

## 2021-02-15 DIAGNOSIS — D649 Anemia, unspecified: Secondary | ICD-10-CM

## 2021-02-15 DIAGNOSIS — E559 Vitamin D deficiency, unspecified: Secondary | ICD-10-CM

## 2021-02-15 DIAGNOSIS — N390 Urinary tract infection, site not specified: Secondary | ICD-10-CM

## 2021-02-15 DIAGNOSIS — E1121 Type 2 diabetes mellitus with diabetic nephropathy: Secondary | ICD-10-CM

## 2021-02-15 DIAGNOSIS — N1831 Chronic kidney disease, stage 3a: Secondary | ICD-10-CM

## 2021-02-16 ENCOUNTER — Other Ambulatory Visit: Payer: Self-pay | Admitting: Internal Medicine

## 2021-02-21 DIAGNOSIS — Z23 Encounter for immunization: Secondary | ICD-10-CM | POA: Diagnosis not present

## 2021-03-01 ENCOUNTER — Other Ambulatory Visit: Payer: Self-pay | Admitting: Internal Medicine

## 2021-03-01 ENCOUNTER — Other Ambulatory Visit: Payer: Self-pay | Admitting: Physician Assistant

## 2021-03-11 ENCOUNTER — Other Ambulatory Visit: Payer: Self-pay | Admitting: Internal Medicine

## 2021-03-20 ENCOUNTER — Other Ambulatory Visit: Payer: Self-pay | Admitting: Internal Medicine

## 2021-03-30 DIAGNOSIS — H903 Sensorineural hearing loss, bilateral: Secondary | ICD-10-CM | POA: Diagnosis not present

## 2021-03-30 DIAGNOSIS — H6123 Impacted cerumen, bilateral: Secondary | ICD-10-CM | POA: Diagnosis not present

## 2021-04-01 ENCOUNTER — Other Ambulatory Visit (INDEPENDENT_AMBULATORY_CARE_PROVIDER_SITE_OTHER): Payer: Medicare Other

## 2021-04-01 ENCOUNTER — Telehealth: Payer: Self-pay | Admitting: Internal Medicine

## 2021-04-01 ENCOUNTER — Other Ambulatory Visit: Payer: Self-pay | Admitting: Internal Medicine

## 2021-04-01 DIAGNOSIS — R350 Frequency of micturition: Secondary | ICD-10-CM | POA: Diagnosis not present

## 2021-04-01 LAB — URINALYSIS, ROUTINE W REFLEX MICROSCOPIC
Bilirubin Urine: NEGATIVE
Hgb urine dipstick: NEGATIVE
Ketones, ur: NEGATIVE
Nitrite: POSITIVE — AB
Specific Gravity, Urine: 1.01 (ref 1.000–1.030)
Urine Glucose: NEGATIVE
Urobilinogen, UA: 0.2 (ref 0.0–1.0)
pH: 6 (ref 5.0–8.0)

## 2021-04-01 LAB — POCT URINALYSIS DIPSTICK
Bilirubin, UA: NEGATIVE
Blood, UA: NEGATIVE
Glucose, UA: NEGATIVE
Ketones, UA: NEGATIVE
Nitrite, UA: POSITIVE
Protein, UA: POSITIVE — AB
Spec Grav, UA: 1.01 (ref 1.010–1.025)
Urobilinogen, UA: 0.2 E.U./dL
pH, UA: 5 (ref 5.0–8.0)

## 2021-04-01 MED ORDER — CIPROFLOXACIN HCL 250 MG PO TABS: 250.0000 mg | ORAL_TABLET | Freq: Two times a day (BID) | ORAL | 0 refills | Status: AC

## 2021-04-01 NOTE — Telephone Encounter (Signed)
Patient will be dropping off a urine today for POC, urne with micro and culture . Please add to lab schedule

## 2021-04-03 LAB — URINE CULTURE
MICRO NUMBER:: 12707596
SPECIMEN QUALITY:: ADEQUATE

## 2021-04-13 ENCOUNTER — Other Ambulatory Visit: Payer: Self-pay | Admitting: Internal Medicine

## 2021-04-19 ENCOUNTER — Other Ambulatory Visit (INDEPENDENT_AMBULATORY_CARE_PROVIDER_SITE_OTHER): Payer: Medicare Other

## 2021-04-19 ENCOUNTER — Other Ambulatory Visit: Payer: Self-pay

## 2021-04-19 DIAGNOSIS — D649 Anemia, unspecified: Secondary | ICD-10-CM | POA: Diagnosis not present

## 2021-04-19 DIAGNOSIS — N390 Urinary tract infection, site not specified: Secondary | ICD-10-CM | POA: Diagnosis not present

## 2021-04-19 DIAGNOSIS — N1831 Chronic kidney disease, stage 3a: Secondary | ICD-10-CM | POA: Diagnosis not present

## 2021-04-19 DIAGNOSIS — E1121 Type 2 diabetes mellitus with diabetic nephropathy: Secondary | ICD-10-CM

## 2021-04-19 DIAGNOSIS — E559 Vitamin D deficiency, unspecified: Secondary | ICD-10-CM | POA: Diagnosis not present

## 2021-04-19 LAB — COMPREHENSIVE METABOLIC PANEL
ALT: 20 U/L (ref 0–35)
AST: 19 U/L (ref 0–37)
Albumin: 4 g/dL (ref 3.5–5.2)
Alkaline Phosphatase: 106 U/L (ref 39–117)
BUN: 48 mg/dL — ABNORMAL HIGH (ref 6–23)
CO2: 22 mEq/L (ref 19–32)
Calcium: 9.2 mg/dL (ref 8.4–10.5)
Chloride: 104 mEq/L (ref 96–112)
Creatinine, Ser: 1.27 mg/dL — ABNORMAL HIGH (ref 0.40–1.20)
GFR: 35.8 mL/min — ABNORMAL LOW (ref >60.00)
Glucose, Bld: 199 mg/dL — ABNORMAL HIGH (ref 70–99)
Potassium: 3.8 mEq/L (ref 3.5–5.1)
Sodium: 136 mEq/L (ref 135–145)
Total Bilirubin: 0.4 mg/dL (ref 0.2–1.2)
Total Protein: 6.2 g/dL (ref 6.0–8.3)

## 2021-04-19 LAB — URINALYSIS, ROUTINE W REFLEX MICROSCOPIC
Bilirubin Urine: NEGATIVE
Hgb urine dipstick: NEGATIVE
Ketones, ur: NEGATIVE
Nitrite: NEGATIVE
RBC / HPF: NONE SEEN (ref 0–?)
Specific Gravity, Urine: 1.015 (ref 1.000–1.030)
Urine Glucose: NEGATIVE
Urobilinogen, UA: 0.2 (ref 0.0–1.0)
pH: 5.5 (ref 5.0–8.0)

## 2021-04-19 LAB — CBC WITH DIFFERENTIAL/PLATELET
Basophils Absolute: 0 10*3/uL (ref 0.0–0.1)
Basophils Relative: 0.4 % (ref 0.0–3.0)
Eosinophils Absolute: 0.2 10*3/uL (ref 0.0–0.7)
Eosinophils Relative: 2.7 % (ref 0.0–5.0)
HCT: 33.6 % — ABNORMAL LOW (ref 36.0–46.0)
Hemoglobin: 11.1 g/dL — ABNORMAL LOW (ref 12.0–15.0)
Lymphocytes Relative: 12.9 % (ref 12.0–46.0)
Lymphs Abs: 1.1 10*3/uL (ref 0.7–4.0)
MCHC: 33.2 g/dL (ref 30.0–36.0)
MCV: 84.8 fl (ref 78.0–100.0)
Monocytes Absolute: 0.5 10*3/uL (ref 0.1–1.0)
Monocytes Relative: 6.2 % (ref 3.0–12.0)
Neutro Abs: 6.6 10*3/uL (ref 1.4–7.7)
Neutrophils Relative %: 77.8 % — ABNORMAL HIGH (ref 43.0–77.0)
Platelets: 177 10*3/uL (ref 150.0–400.0)
RBC: 3.96 Mil/uL (ref 3.87–5.11)
RDW: 14.2 % (ref 11.5–15.5)
WBC: 8.5 10*3/uL (ref 4.0–10.5)

## 2021-04-19 LAB — LIPID PANEL
Cholesterol: 134 mg/dL (ref 0–200)
HDL: 35.5 mg/dL — ABNORMAL LOW (ref >39.00)
NonHDL: 98.64
Total CHOL/HDL Ratio: 4
Triglycerides: 245 mg/dL — ABNORMAL HIGH (ref 0.0–149.0)
VLDL: 49 mg/dL — ABNORMAL HIGH (ref 0.0–40.0)

## 2021-04-19 LAB — HEMOGLOBIN A1C: Hgb A1c MFr Bld: 7.5 % — ABNORMAL HIGH (ref 4.6–6.5)

## 2021-04-19 LAB — MICROALBUMIN / CREATININE URINE RATIO
Creatinine,U: 58.7 mg/dL
Microalb Creat Ratio: 20.7 mg/g (ref 0.0–30.0)
Microalb, Ur: 12.2 mg/dL — ABNORMAL HIGH (ref 0.0–1.9)

## 2021-04-19 LAB — LDL CHOLESTEROL, DIRECT: Direct LDL: 72 mg/dL

## 2021-04-19 LAB — VITAMIN D 25 HYDROXY (VIT D DEFICIENCY, FRACTURES): VITD: <7 ng/mL — ABNORMAL LOW (ref 30.00–100.00)

## 2021-05-04 ENCOUNTER — Other Ambulatory Visit: Payer: Self-pay | Admitting: Internal Medicine

## 2021-05-17 DIAGNOSIS — H16223 Keratoconjunctivitis sicca, not specified as Sjogren's, bilateral: Secondary | ICD-10-CM | POA: Diagnosis not present

## 2021-06-01 ENCOUNTER — Other Ambulatory Visit: Payer: Self-pay | Admitting: Internal Medicine

## 2021-06-01 ENCOUNTER — Other Ambulatory Visit: Payer: Self-pay | Admitting: Cardiovascular Disease

## 2021-06-01 ENCOUNTER — Other Ambulatory Visit: Payer: Self-pay | Admitting: Physician Assistant

## 2021-06-21 ENCOUNTER — Other Ambulatory Visit: Payer: Self-pay | Admitting: Internal Medicine

## 2021-06-29 ENCOUNTER — Other Ambulatory Visit: Payer: Self-pay | Admitting: Internal Medicine

## 2021-06-29 ENCOUNTER — Other Ambulatory Visit: Payer: Self-pay | Admitting: Cardiovascular Disease

## 2021-06-29 NOTE — Telephone Encounter (Signed)
Attempted to schedule.  LMOV to call office.  ° °

## 2021-06-29 NOTE — Telephone Encounter (Signed)
Can you please try to schedule a follow up appt for patient.  ?LS 11/2020 -- Needs a 6 month follow up.  ? ?Thank you!  ?

## 2021-07-02 ENCOUNTER — Other Ambulatory Visit: Payer: Self-pay | Admitting: Internal Medicine

## 2021-07-04 ENCOUNTER — Other Ambulatory Visit: Payer: Self-pay | Admitting: Internal Medicine

## 2021-07-04 MED ORDER — CLOPIDOGREL BISULFATE 75 MG PO TABS: 75.0000 mg | ORAL_TABLET | Freq: Every day | ORAL | 1 refills | Status: DC

## 2021-07-04 MED ORDER — CARVEDILOL 6.25 MG PO TABS: ORAL_TABLET | ORAL | 1 refills | Status: DC

## 2021-08-02 ENCOUNTER — Other Ambulatory Visit: Payer: Self-pay | Admitting: Internal Medicine

## 2021-08-03 ENCOUNTER — Other Ambulatory Visit: Payer: Self-pay | Admitting: Internal Medicine

## 2021-08-24 ENCOUNTER — Other Ambulatory Visit: Payer: Self-pay | Admitting: Physician Assistant

## 2021-08-24 ENCOUNTER — Other Ambulatory Visit: Payer: Self-pay | Admitting: Internal Medicine

## 2021-08-24 NOTE — Telephone Encounter (Signed)
Scheduled

## 2021-08-29 DIAGNOSIS — H6123 Impacted cerumen, bilateral: Secondary | ICD-10-CM | POA: Diagnosis not present

## 2021-08-29 DIAGNOSIS — J340 Abscess, furuncle and carbuncle of nose: Secondary | ICD-10-CM | POA: Diagnosis not present

## 2021-09-03 ENCOUNTER — Other Ambulatory Visit: Payer: Self-pay | Admitting: Internal Medicine

## 2021-09-21 ENCOUNTER — Other Ambulatory Visit: Payer: Self-pay | Admitting: Internal Medicine

## 2021-09-23 ENCOUNTER — Ambulatory Visit (INDEPENDENT_AMBULATORY_CARE_PROVIDER_SITE_OTHER): Payer: Medicare Other | Admitting: Cardiovascular Disease

## 2021-09-23 ENCOUNTER — Encounter: Payer: Self-pay | Admitting: Cardiovascular Disease

## 2021-09-23 VITALS — BP 142/62 | HR 86 | Ht 61.0 in | Wt 140.0 lb

## 2021-09-23 DIAGNOSIS — I350 Nonrheumatic aortic (valve) stenosis: Secondary | ICD-10-CM | POA: Diagnosis not present

## 2021-09-23 DIAGNOSIS — E785 Hyperlipidemia, unspecified: Secondary | ICD-10-CM | POA: Diagnosis not present

## 2021-09-23 DIAGNOSIS — I10 Essential (primary) hypertension: Secondary | ICD-10-CM

## 2021-09-23 DIAGNOSIS — I5032 Chronic diastolic (congestive) heart failure: Secondary | ICD-10-CM

## 2021-09-23 DIAGNOSIS — I25118 Atherosclerotic heart disease of native coronary artery with other forms of angina pectoris: Secondary | ICD-10-CM | POA: Diagnosis not present

## 2021-09-23 NOTE — Patient Instructions (Signed)

## 2021-09-23 NOTE — Progress Notes (Signed)
Cardiology Office Note   Date:  09/23/2021   ID:  Maureen, Morales Feb 04, 1923, MRN [id number]  PCP:  [name], MD  Cardiologist:   [name], MD   Chief Complaint  Patient presents with   Follow-up    6 month F/U-No new cardiac concerns       History of Present Illness: Maureen Morales is a 88 y.o. female who presents for a follow up regarding aortic stenosis and coronary artery disease. She has chronic medical conditions that include hypertension, hyperlipidemia and type 2 diabetes.  She was hospitalized in May of 2018 with non-ST elevation myocardial infarction in the setting of uncontrolled hypertension. Troponin peaked at 5.79. Echocardiogram showed hyperdynamic LV systolic function, mild aortic stenosis with mean gradient of 14 mmHg, mild mitral regurgitation, mild pulmonary hypertension and trivial posterior pericardial effusion. Cardiac catheterization showed mild to moderate nonobstructive coronary artery disease. Worst stenosis was 60% in the mid right coronary artery. Outpatient renal artery duplex showed no evidence of renal artery stenosis. She had right hip fracture in 2018 after a fall and required another surgery after another fall.    She has difficult to control blood pressure with intolerance to antihypertensive medications.  She was hospitalized in April of 2022 with non-ST elevation myocardial infarction in the setting of hypertensive urgency.  Peak high-sensitivity troponin was 1663.  Echocardiogram  showed an ejection fraction of 60 to 65%, moderate left ventricular hypertrophy, grade 1 diastolic dysfunction, mild to moderate mitral regurgitation and mild to moderate aortic stenosis with mean gradient of 16 mmHg and valve area of 1.7 cm.  Medical therapy was recommended with blood pressure control.    She has been doing very well with no recent chest pain, shortness of breath or palpitation.  She had 1 episode of chest pain about 3 months ago in  the setting of elevated blood pressure.  It responded to 1 nitroglycerin.  Past Medical History:  Diagnosis Date   Aortic stenosis    a. 08/2016 Echo: Hyperdynamic LV fxn, mild AS (mean grad 55mHg), mild MR, mild PAH; b. 05/2017 Echo: Hyperdynamic LV fxn, mod AS (mean grad 274mg, valve area 1.84). PASP 10052m.   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 08/2017 Renal U/S: No RAS.   Low back pain with left-sided sciatica 04/29/2018   [facility]   Non-obstructive CAD (coronary artery disease)    a. 08/2016 NSTEMI/Cath: mild to mod nonobs dzs including 60% mRCA stenosis-->Med rx.   [place]STEMI (non-ST elevated myocardial infarction) ([facility] [place]/06/[place]   [place]STEMI (non-ST elevated myocardial infarction) ([place]20/20[place]   Nummular eczema 01/16/2011   Occurring on chest and under breast.  Treated by Dr DasEvorn Gongth fluocinonide 0.05% topical 06/26/12    [place] ([place]  Periprosthetic fracture around internal prosthetic hip joint, initial encounter    PONV (postoperative nausea and vomiting)    Positive H. pylori test  Pre-diabetes    Suspected COVID-19 virus infection 01/10/2019   Patient reporting malaise and headache for the past 3 days,  Similar to presentation when she was positive for  COVID 19 INFECTION  July 2 .  Previous inection was caused by exposure to COVID positive caregiver.  Repeat scenario:  Same caregiver's mother tested positive on Monday and caregiver saw current patient on Tuesday     Viral  gastroenteritis due to [place]-like agent Nov 2012    Past Surgical History:  Procedure Laterality Date   ABDOMINAL HYSTERECTOMY     BLADDER SURGERY     CARDIAC CATHETERIZATION     CATARACT EXTRACTION W/PHACO Right 09/29/2020   Procedure: CATARACT EXTRACTION PHACO AND INTRAOCULAR LENS PLACEMENT ([place]) RIGHT VISION BLUE 7.87 00:58.7 ;  Surgeon: Leandrew Koyanagi, MD;  Location: [place];  Service: Ophthalmology;  Laterality: Right;   CATARACT EXTRACTION W/PHACO Left 10/13/2020   Procedure: CATARACT EXTRACTION PHACO AND INTRAOCULAR LENS PLACEMENT ([place]) LEFT VISION BLUE;  Surgeon: Leandrew Koyanagi, MD;  Location: [place];  Service: Ophthalmology;  Laterality: Left;  4.94 1:03.0 7.9%   CHOLECYSTECTOMY     COLONOSCOPY     06/16/1987, 02/20/1995, 05/04/1999, 02/14/2005, 05/17/2007   ESOPHAGOGASTRODUODENOSCOPY     05/11/1987, 06/13/1995, 10/09/1995, 05/04/1999, 01/16/2002   FLEXIBLE SIGMOIDOSCOPY N/A 05/02/2017   Procedure: Otho Darner SIGMOIDOSCOPY;  Surgeon: Manya Silvas, MD;  Location: [facility] ENDOSCOPY;  Service: Endoscopy;  Laterality: N/A;   HEMORRHOID SURGERY N/A 05/14/2017   Procedure: EXTERNAL THROMBOSED HEMORRHOIDECTOMY;  Surgeon: Robert Bellow, MD;  Location: [facility];  Service: General;  Laterality: N/A;   HIP ARTHROPLASTY Right 02/17/2017   Procedure: ARTHROPLASTY BIPOLAR HIP (HEMIARTHROPLASTY);  Surgeon: Claud Kelp, MD;  Location: [facility];  Service: Orthopedics;  Laterality: Right;   LEFT HEART CATH AND CORONARY ANGIOGRAPHY N/A 08/31/2016   Procedure: Left Heart Cath and Coronary Angiography;  Surgeon: Wellington Hampshire, MD;  Location: [place] CV LAB;  Service: Cardiovascular;  Laterality: N/A;   ORIF PERIPROSTHETIC FRACTURE Right 05/27/2017   ORIF PERIPROSTHETIC FRACTURE Right 05/27/2017   Procedure: OPEN REDUCTION INTERNAL FIXATION (ORIF) PERIPROSTHETIC FRACTURE;  Surgeon: Mcarthur Rossetti, MD;  Location: [place];  Service: Orthopedics;   Laterality: Right;   TOTAL HIP ARTHROPLASTY Right    uterian prolapse       Current Outpatient Medications  Medication Sig Dispense Refill   acetaminophen (TYLENOL) 500 MG tablet Take 1,000 mg by mouth every 6 (six) hours as needed for moderate pain or headache.     albuterol (VENTOLIN HFA) 108 (90 Base) MCG/ACT inhaler Inhale 2 puffs into the lungs every 6 (six) hours as needed for wheezing or shortness of breath. 18 g 3   atorvastatin (LIPITOR) 40 MG tablet TAKE 1 TABLET BY MOUTH DAILY 90 tablet 1   Carboxymethylcellul-Glycerin (LUBRICATING EYE DROPS OP) Place 1 drop into both eyes daily as needed (dry eyes).     carvedilol (COREG) 6.25 MG tablet TAKE ONE TABLET TWICE DAILY WITH A MEAL 180 tablet 1   clopidogrel (PLAVIX) 75 MG tablet Take 1 tablet (75 mg total) by mouth daily. 90 tablet 1   CONTOUR NEXT TEST test strip USE AS DIRECTED TWICE A DAY 100 each 5   famotidine (PEPCID) 10 MG tablet Take 10 mg by mouth daily as needed for heartburn or indigestion.     glipiZIDE (GLUCOTROL) 5 MG tablet TAKE ONE TABLET TWICE A DAY BEFORE MEALS 180 tablet 1   hydrALAZINE (APRESOLINE) 25 MG tablet TAKE THREE TABLETS BY  MOUTH WITH BREAKFAST  TAKE THREE TABLETS WITH LUNCHAND TAKE TWO TABLETS WITH DINNER AS DIRECTED 360 tablet 1   hydrochlorothiazide (MICROZIDE) 12.5 MG capsule TAKE 1 CAPSULE BY MOUTH EVERY DAY 30 capsule 6   isosorbide mononitrate (IMDUR) 30 MG 24 hr tablet TAKE ONE TABLET BY MOUTH EVERY MORNING AND TAKE TWO TABLETS EVERY EVENING 270 tablet 0   Lactobacillus (ULTIMATE PROBIOTIC FORMULA) CAPS Take 1 capsule by mouth daily with supper.     lansoprazole (PREVACID) 30 MG capsule Take 30 mg by mouth daily as needed.     loratadine (CLARITIN) 10 MG tablet Take 10 mg daily by mouth.      losartan (COZAAR) 100 MG tablet TAKE ONE TABLET BY MOUTH EVERY DAY 90 tablet 1   nitroGLYCERIN (NITROSTAT) 0.4 MG SL tablet Place 1 tablet (0.4 mg total) under the tongue every 5 (five) minutes as needed for  chest pain. 10 tablet 2   sertraline (ZOLOFT) 100 MG tablet TAKE 1 TABLET BY MOUTH DAILY. 30 tablet 3   Trospium Chloride 60 MG CP24 TAKE ONE CAPSULE EACH MORNING BEFORE BREAKFAST 90 capsule 1   vitamin C (ASCORBIC ACID) 500 MG tablet Take 1 tablet (500 mg total) by mouth 2 (two) times daily. 60 tablet 2   No current facility-administered medications for this visit.    Allergies:   Clindamycin, Oxytetracycline, Phenobarbital, Atropine, Belladonna alkaloids, Codeine, Darvon [propoxyphene], Demerol [meperidine], Methocarbamol, Penicillins, Sulfa antibiotics, and Iron    Social History:  The patient  reports that she has never smoked. She has never used smokeless tobacco. She reports that she does not drink alcohol and does not use drugs.   Family History:  The patient's family history includes Breast cancer (age of onset: 27) in her sister; Breast cancer (age of onset: 90) in her mother; Cancer in her mother; Colon cancer in her brother, brother, and father; Stroke in her sister.    ROS:  Please see the history of present illness.   Otherwise, review of systems are positive for none.   All other systems are reviewed and negative.    PHYSICAL EXAM: VS:  BP (!) 142/62 (BP Location: Right Arm, Patient Position: Sitting, Cuff Size: Normal)   Pulse 86   Ht '5\' 1"'$  (1.549 m)   Wt 140 lb (63.5 kg)   SpO2 98%   BMI 26.45 kg/m  , BMI Body mass index is 26.45 kg/m. GEN: Well nourished, well developed, in no acute distress  HEENT: normal  Neck: no JVD, carotid bruits, or masses Cardiac: RRR; no rubs, or gallops, mild bilateral leg edema . 3/6 crescendo decrescendo systolic murmur in the aortic area which is mid peaking Respiratory:  clear to auscultation bilaterally, normal work of breathing GI: soft, nontender, nondistended, + BS MS: no deformity or atrophy  Skin: warm and dry, no rash Neuro:  Strength and sensation are intact Psych: euthymic mood, full affect    EKG:  EKG is ordered  today. The ekg ordered today demonstrates normal sinus rhythm with nonspecific T wave changes.   Recent Labs: 04/19/2021: ALT 20; BUN 48; Creatinine, Ser 1.27; Hemoglobin 11.1; Platelets 177.0; Potassium 3.8; Sodium 136    Lipid Panel    Component Value Date/Time   CHOL 134 04/19/2021 0951   TRIG 245.0 (H) 04/19/2021 0951   HDL 35.50 (L) 04/19/2021 0951   CHOLHDL 4 04/19/2021 0951   VLDL 49.0 (H) 04/19/2021 0951   LDLCALC 68 12/04/2016 0808   LDLDIRECT 72.0 04/19/2021 0951  Wt Readings from Last 3 Encounters:  09/23/21 140 lb (63.5 kg)  12/14/20 139 lb 8 oz (63.3 kg)  10/13/20 138 lb 0.1 oz (62.6 kg)           View : No data to display.            ASSESSMENT AND PLAN:  1.  Coronary artery disease involving native coronary arteries with stable angina: She is doing reasonably well overall with controlled symptoms.  Continue medical therapy.    2.  Moderate aortic stenosis.  This was stable on most recent echocardiogram in April, 2022.  The murmur is likely slightly louder today.  I discussed with her the natural history of aortic stenosis.  Even though she is very functional, I do not think she will be a good candidate for TAVR in the future.  Thus, there is no point of obtaining an echocardiogram.  Clinically, she appears to be stable.  I discussed with her the symptoms of aortic stenosis.   3.  Essential hypertension: Blood pressure is reasonably controlled on current medications   4.  Hyperlipidemia: Continue treatment with atorvastatin.  Most recent lipid profile showed an LDL of 72  5.  Chronic diastolic heart failure: She appears to be euvolemic.    Disposition:   FU with me in 6 months.  Signed,  [name], MD  09/23/2021 2:24 PM    [place]

## 2021-09-28 DIAGNOSIS — D1801 Hemangioma of skin and subcutaneous tissue: Secondary | ICD-10-CM | POA: Diagnosis not present

## 2021-09-28 DIAGNOSIS — L738 Other specified follicular disorders: Secondary | ICD-10-CM | POA: Diagnosis not present

## 2021-09-28 DIAGNOSIS — L821 Other seborrheic keratosis: Secondary | ICD-10-CM | POA: Diagnosis not present

## 2021-10-05 ENCOUNTER — Other Ambulatory Visit: Payer: Self-pay | Admitting: Internal Medicine

## 2021-10-11 ENCOUNTER — Other Ambulatory Visit: Payer: Self-pay | Admitting: Internal Medicine

## 2021-10-26 ENCOUNTER — Telehealth: Payer: Self-pay | Admitting: Internal Medicine

## 2021-10-26 ENCOUNTER — Other Ambulatory Visit: Payer: Self-pay | Admitting: Internal Medicine

## 2021-10-26 MED ORDER — ALBUTEROL SULFATE HFA 108 (90 BASE) MCG/ACT IN AERS: 2.0000 | INHALATION_SPRAY | Freq: Four times a day (QID) | RESPIRATORY_TRACT | 3 refills | Status: DC | PRN

## 2021-10-26 NOTE — Telephone Encounter (Signed)
Medication has been refilled.

## 2021-10-26 NOTE — Telephone Encounter (Signed)
Remo Lipps from [facility] called stating pt need a new prescription for her albuterol inhaler. It is out of date

## 2021-11-08 ENCOUNTER — Other Ambulatory Visit: Payer: Self-pay | Admitting: Internal Medicine

## 2021-11-09 DIAGNOSIS — M3501 Sicca syndrome with keratoconjunctivitis: Secondary | ICD-10-CM | POA: Diagnosis not present

## 2021-11-09 DIAGNOSIS — H0100A Unspecified blepharitis right eye, upper and lower eyelids: Secondary | ICD-10-CM | POA: Diagnosis not present

## 2021-11-09 DIAGNOSIS — H16223 Keratoconjunctivitis sicca, not specified as Sjogren's, bilateral: Secondary | ICD-10-CM | POA: Diagnosis not present

## 2021-11-09 DIAGNOSIS — H0100B Unspecified blepharitis left eye, upper and lower eyelids: Secondary | ICD-10-CM | POA: Diagnosis not present

## 2021-11-22 ENCOUNTER — Other Ambulatory Visit: Payer: Self-pay | Admitting: Pulmonary Disease

## 2021-11-22 ENCOUNTER — Other Ambulatory Visit: Payer: Self-pay | Admitting: Internal Medicine

## 2021-11-22 NOTE — Telephone Encounter (Signed)
Historical Medication Last OV: 08/24/2020 Next OV: not scheduled

## 2021-11-24 ENCOUNTER — Other Ambulatory Visit: Payer: Self-pay | Admitting: Internal Medicine

## 2021-11-24 ENCOUNTER — Other Ambulatory Visit: Payer: Self-pay | Admitting: Physician Assistant

## 2021-11-24 DIAGNOSIS — J40 Bronchitis, not specified as acute or chronic: Secondary | ICD-10-CM | POA: Insufficient documentation

## 2021-11-24 MED ORDER — MOLNUPIRAVIR EUA 200MG CAPSULE: 4.0000 | ORAL_CAPSULE | Freq: Two times a day (BID) | ORAL | 0 refills | Status: AC

## 2021-11-24 MED ORDER — COVID-19 HOME COLLECTION TEST VI KIT: PACK | 1 refills | Status: AC

## 2021-11-24 MED ORDER — AZITHROMYCIN 500 MG PO TABS: 500.0000 mg | ORAL_TABLET | Freq: Every day | ORAL | 0 refills | Status: DC

## 2021-11-24 MED ORDER — PREDNISONE 10 MG PO TABS: ORAL_TABLET | ORAL | 0 refills | Status: DC

## 2021-11-24 MED ORDER — ALBUTEROL SULFATE (2.5 MG/3ML) 0.083% IN NEBU: 2.5000 mg | INHALATION_SOLUTION | Freq: Four times a day (QID) | RESPIRATORY_TRACT | 12 refills | Status: DC | PRN

## 2021-11-24 NOTE — Assessment & Plan Note (Signed)
Treating empirically for COVID and CAP with molnupiravir and azithromycin ,  Prednisone and albuterol

## 2021-12-02 ENCOUNTER — Other Ambulatory Visit: Payer: Self-pay | Admitting: Internal Medicine

## 2021-12-02 NOTE — Telephone Encounter (Signed)
Okay to refill?  Last OV: 07/2020

## 2022-01-04 ENCOUNTER — Other Ambulatory Visit (INDEPENDENT_AMBULATORY_CARE_PROVIDER_SITE_OTHER): Payer: Medicare Other

## 2022-01-04 ENCOUNTER — Other Ambulatory Visit: Payer: Self-pay | Admitting: Internal Medicine

## 2022-01-04 DIAGNOSIS — E119 Type 2 diabetes mellitus without complications: Secondary | ICD-10-CM

## 2022-01-04 DIAGNOSIS — R300 Dysuria: Secondary | ICD-10-CM | POA: Diagnosis not present

## 2022-01-04 DIAGNOSIS — R5383 Other fatigue: Secondary | ICD-10-CM

## 2022-01-04 DIAGNOSIS — D649 Anemia, unspecified: Secondary | ICD-10-CM | POA: Diagnosis not present

## 2022-01-04 DIAGNOSIS — R0600 Dyspnea, unspecified: Secondary | ICD-10-CM

## 2022-01-04 DIAGNOSIS — E559 Vitamin D deficiency, unspecified: Secondary | ICD-10-CM

## 2022-01-04 DIAGNOSIS — N390 Urinary tract infection, site not specified: Secondary | ICD-10-CM

## 2022-01-04 LAB — CBC WITH DIFFERENTIAL/PLATELET
Basophils Absolute: 0 10*3/uL (ref 0.0–0.1)
Basophils Relative: 0.6 % (ref 0.0–3.0)
Eosinophils Absolute: 0.3 10*3/uL (ref 0.0–0.7)
Eosinophils Relative: 3 % (ref 0.0–5.0)
HCT: 29.8 % — ABNORMAL LOW (ref 36.0–46.0)
Hemoglobin: 10.3 g/dL — ABNORMAL LOW (ref 12.0–15.0)
Lymphocytes Relative: 14.4 % (ref 12.0–46.0)
Lymphs Abs: 1.2 10*3/uL (ref 0.7–4.0)
MCHC: 34.5 g/dL (ref 30.0–36.0)
MCV: 84.8 fl (ref 78.0–100.0)
Monocytes Absolute: 0.7 10*3/uL (ref 0.1–1.0)
Monocytes Relative: 7.7 % (ref 3.0–12.0)
Neutro Abs: 6.3 10*3/uL (ref 1.4–7.7)
Neutrophils Relative %: 74.3 % (ref 43.0–77.0)
Platelets: 158 10*3/uL (ref 150.0–400.0)
RBC: 3.51 Mil/uL — ABNORMAL LOW (ref 3.87–5.11)
RDW: 13.5 % (ref 11.5–15.5)
WBC: 8.4 10*3/uL (ref 4.0–10.5)

## 2022-01-04 LAB — URINALYSIS, ROUTINE W REFLEX MICROSCOPIC
Bilirubin Urine: NEGATIVE
Hgb urine dipstick: NEGATIVE
Ketones, ur: NEGATIVE
Leukocytes,Ua: NEGATIVE
Nitrite: NEGATIVE
RBC / HPF: NONE SEEN (ref 0–?)
Specific Gravity, Urine: <=1.005 — AB (ref 1.000–1.030)
Total Protein, Urine: NEGATIVE
Urine Glucose: NEGATIVE
Urobilinogen, UA: 0.2 (ref 0.0–1.0)
pH: 5.5 (ref 5.0–8.0)

## 2022-01-04 LAB — COMPREHENSIVE METABOLIC PANEL
ALT: 23 U/L (ref 0–35)
AST: 19 U/L (ref 0–37)
Albumin: 3.7 g/dL (ref 3.5–5.2)
Alkaline Phosphatase: 81 U/L (ref 39–117)
BUN: 34 mg/dL — ABNORMAL HIGH (ref 6–23)
CO2: 24 mEq/L (ref 19–32)
Calcium: 9.1 mg/dL (ref 8.4–10.5)
Chloride: 99 mEq/L (ref 96–112)
Creatinine, Ser: 1.23 mg/dL — ABNORMAL HIGH (ref 0.40–1.20)
GFR: 37.01 mL/min — ABNORMAL LOW (ref >60.00)
Glucose, Bld: 231 mg/dL — ABNORMAL HIGH (ref 70–99)
Potassium: 4.2 mEq/L (ref 3.5–5.1)
Sodium: 132 mEq/L — ABNORMAL LOW (ref 135–145)
Total Bilirubin: 0.5 mg/dL (ref 0.2–1.2)
Total Protein: 6 g/dL (ref 6.0–8.3)

## 2022-01-04 LAB — MICROALBUMIN / CREATININE URINE RATIO
Creatinine,U: 30.9 mg/dL
Microalb Creat Ratio: 18.7 mg/g (ref 0.0–30.0)
Microalb, Ur: 5.8 mg/dL — ABNORMAL HIGH (ref 0.0–1.9)

## 2022-01-04 LAB — BRAIN NATRIURETIC PEPTIDE: Pro B Natriuretic peptide (BNP): 218 pg/mL — ABNORMAL HIGH (ref 0.0–100.0)

## 2022-01-04 LAB — VITAMIN B12: Vitamin B-12: 387 pg/mL (ref 211–911)

## 2022-01-04 LAB — HEMOGLOBIN A1C: Hgb A1c MFr Bld: 7.5 % — ABNORMAL HIGH (ref 4.6–6.5)

## 2022-01-04 LAB — VITAMIN D 25 HYDROXY (VIT D DEFICIENCY, FRACTURES): VITD: 48.69 ng/mL (ref 30.00–100.00)

## 2022-01-05 ENCOUNTER — Other Ambulatory Visit: Payer: Self-pay | Admitting: Internal Medicine

## 2022-01-05 LAB — IRON,TIBC AND FERRITIN PANEL
%SAT: 21 % (calc) (ref 16–45)
Ferritin: 180 ng/mL (ref 16–288)
Iron: 52 ug/dL (ref 45–160)
TIBC: 251 mcg/dL (calc) (ref 250–450)

## 2022-01-05 LAB — THYROID PANEL WITH TSH
Free Thyroxine Index: 2 (ref 1.4–3.8)
T3 Uptake: 32 % (ref 22–35)
T4, Total: 6.3 ug/dL (ref 5.1–11.9)
TSH: 4.12 mIU/L (ref 0.40–4.50)

## 2022-01-06 ENCOUNTER — Telehealth: Payer: Self-pay

## 2022-01-06 ENCOUNTER — Other Ambulatory Visit: Payer: Self-pay | Admitting: Internal Medicine

## 2022-01-06 MED ORDER — CIPROFLOXACIN HCL 250 MG PO TABS: 250.0000 mg | ORAL_TABLET | Freq: Two times a day (BID) | ORAL | 0 refills | Status: DC

## 2022-01-06 NOTE — Telephone Encounter (Signed)
-----   Message from [name], MD sent at 01/06/2022  2:20 PM EDT ----- Culture suggests she has a uti.  [facility] sent to [facility]

## 2022-01-06 NOTE — Telephone Encounter (Signed)
Spoke w/ daughter and advised her of urine results

## 2022-01-06 NOTE — Progress Notes (Signed)
Spoke to Maureen Morales and informed her of lab results

## 2022-01-07 LAB — URINE CULTURE
MICRO NUMBER:: 13879249
SPECIMEN QUALITY:: ADEQUATE

## 2022-01-08 MED ORDER — NITROFURANTOIN MONOHYD MACRO 100 MG PO CAPS: 100.0000 mg | ORAL_CAPSULE | Freq: Two times a day (BID) | ORAL | 0 refills | Status: DC

## 2022-01-08 NOTE — Assessment & Plan Note (Signed)
RECURRENT ,  With sensitivity to ampicillin , macrobid and fosfomycin

## 2022-01-08 NOTE — Addendum Note (Signed)
Addended by: [name] on: 01/08/2022 04:01 PM   Modules accepted: Orders

## 2022-01-10 ENCOUNTER — Other Ambulatory Visit: Payer: Self-pay | Admitting: Internal Medicine

## 2022-01-25 ENCOUNTER — Other Ambulatory Visit: Payer: Self-pay | Admitting: Internal Medicine

## 2022-02-10 DIAGNOSIS — H04123 Dry eye syndrome of bilateral lacrimal glands: Secondary | ICD-10-CM | POA: Diagnosis not present

## 2022-02-24 ENCOUNTER — Other Ambulatory Visit: Payer: Self-pay | Admitting: Physician Assistant

## 2022-02-28 DIAGNOSIS — Z23 Encounter for immunization: Secondary | ICD-10-CM | POA: Diagnosis not present

## 2022-03-10 ENCOUNTER — Other Ambulatory Visit: Payer: Self-pay | Admitting: Internal Medicine

## 2022-03-17 ENCOUNTER — Encounter: Payer: Self-pay | Admitting: Cardiovascular Disease

## 2022-03-17 ENCOUNTER — Ambulatory Visit: Payer: Medicare Other | Attending: Cardiovascular Disease | Admitting: Cardiovascular Disease

## 2022-03-17 VITALS — BP 124/58 | HR 74 | Ht 62.0 in | Wt 139.0 lb

## 2022-03-17 DIAGNOSIS — I10 Essential (primary) hypertension: Secondary | ICD-10-CM

## 2022-03-17 DIAGNOSIS — E785 Hyperlipidemia, unspecified: Secondary | ICD-10-CM | POA: Insufficient documentation

## 2022-03-17 DIAGNOSIS — I25118 Atherosclerotic heart disease of native coronary artery with other forms of angina pectoris: Secondary | ICD-10-CM | POA: Diagnosis not present

## 2022-03-17 DIAGNOSIS — I350 Nonrheumatic aortic (valve) stenosis: Secondary | ICD-10-CM | POA: Diagnosis not present

## 2022-03-17 DIAGNOSIS — I5032 Chronic diastolic (congestive) heart failure: Secondary | ICD-10-CM | POA: Insufficient documentation

## 2022-03-17 MED ORDER — CARVEDILOL 6.25 MG PO TABS: 6.2500 mg | ORAL_TABLET | Freq: Two times a day (BID) | ORAL | 3 refills | Status: DC

## 2022-03-17 MED ORDER — ISOSORBIDE MONONITRATE ER 30 MG PO TB24: ORAL_TABLET | ORAL | 1 refills | Status: DC

## 2022-03-17 NOTE — Progress Notes (Signed)
Cardiology Office Note   Date:  03/20/2022   ID:  Maureen Morales, DOB 1922-11-14, MRN [id number]  PCP:  [name], MD  Cardiologist:   [name], MD   Chief Complaint  Patient presents with   Other    6 month f/u no complaints today. Meds reviewed verbally with pt.       History of Present Illness: Maureen Morales is a 88 y.o. female who presents for a follow up regarding aortic stenosis and coronary artery disease. She has chronic medical conditions that include hypertension, hyperlipidemia and type 2 diabetes.  She was hospitalized in May of 2018 with non-ST elevation myocardial infarction in the setting of uncontrolled hypertension. Troponin peaked at 5.79. Echocardiogram showed hyperdynamic LV systolic function, mild aortic stenosis with mean gradient of 14 mmHg, mild mitral regurgitation, mild pulmonary hypertension and trivial posterior pericardial effusion. Cardiac catheterization showed mild to moderate nonobstructive coronary artery disease. Worst stenosis was 60% in the mid right coronary artery. Outpatient renal artery duplex showed no evidence of renal artery stenosis. She had right hip fracture in 2018 after a fall and required another surgery after another fall.    She has difficult to control blood pressure with intolerance to antihypertensive medications.  She was hospitalized in April of 2022 with non-ST elevation myocardial infarction in the setting of hypertensive urgency.  Peak high-sensitivity troponin was 1663.  Echocardiogram  showed an ejection fraction of 60 to 65%, moderate left ventricular hypertrophy, grade 1 diastolic dysfunction, mild to moderate mitral regurgitation and mild to moderate aortic stenosis with mean gradient of 16 mmHg and valve area of 1.7 cm.  Medical therapy was recommended with blood pressure control.    She has been doing very well overall with no recent hospitalizations.  Blood pressure has been more controlled at home in  the recent months.  No recent chest pain or worsening dyspnea.  Past Medical History:  Diagnosis Date   Aortic stenosis    a. 08/2016 Echo: Hyperdynamic LV fxn, mild AS (mean grad 30mHg), mild MR, mild PAH; b. 05/2017 Echo: Hyperdynamic LV fxn, mod AS (mean grad 252mg, valve area 1.84). PASP 10017m.   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 08/2017 Renal U/S: No RAS.   Low back pain with left-sided sciatica 04/29/2018   [facility]   Non-obstructive CAD (coronary artery disease)    a. 08/2016 NSTEMI/Cath: mild to mod nonobs dzs including 60% mRCA stenosis-->Med rx.   NSTEMI (non-ST elevated myocardial infarction) ([facility]/06/2016   NSTEMI (non-ST elevated myocardial infarction) ([facility]/20/2022   Nummular eczema 01/16/2011   Occurring on chest and under breast.  Treated by Dr DasEvorn Gongth fluocinonide 0.05% topical 06/26/12    Peripheral vascular disease ([facility]  Periprosthetic fracture around internal prosthetic hip joint, initial encounter    PONV (postoperative nausea and vomiting)    Positive H. pylori test    Pre-diabetes  Suspected COVID-19 virus infection 01/10/2019   Patient reporting malaise and headache for the past 3 days,  Similar to presentation when she was positive for  COVID 19 INFECTION  July 2 .  Previous inection was caused by exposure to COVID positive caregiver.  Repeat scenario:  Same caregiver's mother tested positive on Monday and caregiver saw current patient on Tuesday     Viral gastroenteritis due to  [place]-like agent Nov 2012    Past Surgical History:  Procedure Laterality Date   ABDOMINAL HYSTERECTOMY     BLADDER SURGERY     CARDIAC CATHETERIZATION     CATARACT EXTRACTION W/PHACO Right 09/29/2020   Procedure: CATARACT EXTRACTION PHACO AND INTRAOCULAR LENS PLACEMENT ([place]) RIGHT VISION BLUE 7.87 00:58.7 ;  Surgeon: Leandrew Koyanagi, MD;  Location: [facility];  Service: Ophthalmology;  Laterality: Right;   CATARACT EXTRACTION W/PHACO Left 10/13/2020   Procedure: CATARACT EXTRACTION PHACO AND INTRAOCULAR LENS PLACEMENT ([place]) LEFT VISION BLUE;  Surgeon: Leandrew Koyanagi, MD;  Location: [facility];  Service: Ophthalmology;  Laterality: Left;  4.94 1:03.0 7.9%   CHOLECYSTECTOMY     COLONOSCOPY     06/16/1987, 02/20/1995, 05/04/1999, 02/14/2005, 05/17/2007   ESOPHAGOGASTRODUODENOSCOPY     05/11/1987, 06/13/1995, 10/09/1995, 05/04/1999, 01/16/2002   FLEXIBLE SIGMOIDOSCOPY N/A 05/02/2017   Procedure: Otho Darner SIGMOIDOSCOPY;  Surgeon: Manya Silvas, MD;  Location[facility];  Service: Endoscopy;  Laterality: N/A;   HEMORRHOID SURGERY N/A 05/14/2017   Procedure: EXTERNAL THROMBOSED HEMORRHOIDECTOMY;  Surgeon: Robert Bellow, MD;  Location: [facility] ORS;  Service: General;  Laterality: N/A;   HIP ARTHROPLASTY Right 02/17/2017   Procedure: ARTHROPLASTY BIPOLAR HIP (HEMIARTHROPLASTY);  Surgeon: Claud Kelp, MD;  Location: [facility] ORS;  Service: Orthopedics;  Laterality: Right;   LEFT HEART CATH AND CORONARY ANGIOGRAPHY N/A 08/31/2016   Procedure: Left Heart Cath and Coronary Angiography;  Surgeon: Wellington Hampshire, MD;  Location: [place] CV LAB;  Service: Cardiovascular;  Laterality: N/A;   ORIF PERIPROSTHETIC FRACTURE Right 05/27/2017   ORIF PERIPROSTHETIC FRACTURE Right 05/27/2017   Procedure: OPEN REDUCTION INTERNAL FIXATION (ORIF) PERIPROSTHETIC FRACTURE;  Surgeon: Mcarthur Rossetti, MD;  Location: [place];  Service: Orthopedics;  Laterality: Right;    TOTAL HIP ARTHROPLASTY Right    uterian prolapse       Current Outpatient Medications  Medication Sig Dispense Refill   acetaminophen (TYLENOL) 500 MG tablet Take 1,000 mg by mouth every 6 (six) hours as needed for moderate pain or headache.     albuterol (PROVENTIL) (2.5 MG/3ML) 0.083% nebulizer solution Take 3 mLs (2.5 mg total) by nebulization every 6 (six) hours as needed for wheezing or shortness of breath. 75 mL 12   albuterol (VENTOLIN HFA) 108 (90 Base) MCG/ACT inhaler Inhale 2 puffs into the lungs every 6 (six) hours as needed for wheezing or shortness of breath. 18 g 3   atorvastatin (LIPITOR) 40 MG tablet TAKE 1 TABLET BY MOUTH DAILY 90 tablet 1   Carboxymethylcellul-Glycerin (LUBRICATING EYE DROPS OP) Place 1 drop into both eyes daily as needed (dry eyes).     clopidogrel (PLAVIX) 75 MG tablet TAKE ONE TABLET BY MOUTH EVERY DAY 90 tablet 1   CONTOUR NEXT TEST test strip USE AS DIRECTED TWICE A DAY 100 each 5   COVID-19 Home Collection Test KIT Use as needed for signs and symptoms of COVID INFECTION 4 kit 1   famotidine (PEPCID) 10 MG tablet Take 10 mg by mouth daily as needed for heartburn or indigestion.  glipiZIDE (GLUCOTROL) 5 MG tablet TAKE ONE TABLET TWICE A DAY BEFORE MEALS 180 tablet 1   hydrALAZINE (APRESOLINE) 25 MG tablet TAKE THREE TABLETS BY MOUTH WITH BREAKFAST  TAKE THREE TABLETS WITH LUNCHAND TAKE TWO TABLETS WITH DINNER AS DIRECTED 360 tablet 1   hydrochlorothiazide (MICROZIDE) 12.5 MG capsule TAKE 1 CAPSULE BY MOUTH EVERY DAY 30 capsule 6   Lactobacillus (ULTIMATE PROBIOTIC FORMULA) CAPS Take 1 capsule by mouth daily with supper.     lansoprazole (PREVACID) 30 MG capsule TAKE 1 CAPSULE EVERY DAY BEFORE SUPPER 90 capsule 1   loratadine (CLARITIN) 10 MG tablet Take 10 mg daily by mouth.      losartan (COZAAR) 100 MG tablet TAKE ONE TABLET BY MOUTH EVERY DAY 90 tablet 1   nitroGLYCERIN (NITROSTAT) 0.4 MG SL tablet Place 1 tablet (0.4 mg total) under the tongue  every 5 (five) minutes as needed for chest pain. 10 tablet 2   sertraline (ZOLOFT) 100 MG tablet TAKE 1 TABLET BY MOUTH DAILY. 30 tablet 3   Trospium Chloride 60 MG CP24 TAKE ONE CAPSULE EACH MORNING BEFORE BREAKFAST 90 capsule 1   vitamin C (ASCORBIC ACID) 500 MG tablet Take 1 tablet (500 mg total) by mouth 2 (two) times daily. 60 tablet 2   VITAMIN D PO Take by mouth daily at 2 am.     carvedilol (COREG) 6.25 MG tablet Take 1 tablet (6.25 mg total) by mouth 2 (two) times daily with a meal. 180 tablet 3   isosorbide mononitrate (IMDUR) 30 MG 24 hr tablet TAKE ONE TABLET BY MOUTH EVERY MORNING AND TAKE TWO TABLETS EVERY EVENING 270 tablet 1   No current facility-administered medications for this visit.    Allergies:   Clindamycin, Oxytetracycline, Phenobarbital, Atropine, Belladonna alkaloids, Codeine, Darvon [propoxyphene], Demerol [meperidine], Methocarbamol, Penicillins, Sulfa antibiotics, and Iron    Social History:  The patient  reports that she has never smoked. She has never used smokeless tobacco. She reports that she does not drink alcohol and does not use drugs.   Family History:  The patient's family history includes Breast cancer (age of onset: 89) in her sister; Breast cancer (age of onset: 30) in her mother; Cancer in her mother; Colon cancer in her brother, brother, and father; Stroke in her sister.    ROS:  Please see the history of present illness.   Otherwise, review of systems are positive for none.   All other systems are reviewed and negative.    PHYSICAL EXAM: VS:  BP (!) 124/58 (BP Location: Left Arm, Patient Position: Sitting, Cuff Size: Normal)   Pulse 74   Ht _0  (1.575 m)   Wt 139 lb (63 kg)   SpO2 97%   BMI 25.42 kg/m  , BMI Body mass index is 25.42 kg/m. GEN: Well nourished, well developed, in no acute distress  HEENT: normal  Neck: no JVD, carotid bruits, or masses Cardiac: RRR; no rubs, or gallops, mild bilateral leg edema . 3/6 crescendo decrescendo  systolic murmur in the aortic area which is mid peaking Respiratory:  clear to auscultation bilaterally, normal work of breathing GI: soft, nontender, nondistended, + BS MS: no deformity or atrophy  Skin: warm and dry, no rash Neuro:  Strength and sensation are intact Psych: euthymic mood, full affect    EKG:  EKG is ordered today. The ekg ordered today demonstrates normal sinus rhythm with nonspecific T wave changes.  Left ventricular hypertrophy with repolarization abnormalities   Recent Labs: 01/04/2022: ALT 23;  BUN 34; Creatinine, Ser 1.23; Hemoglobin 10.3; Platelets 158.0; Potassium 4.2; Pro B Natriuretic peptide (BNP) 218.0; Sodium 132; TSH 4.12    Lipid Panel    Component Value Date/Time   CHOL 134 04/19/2021 0951   TRIG 245.0 (H) 04/19/2021 0951   HDL 35.50 (L) 04/19/2021 0951   CHOLHDL 4 04/19/2021 0951   VLDL 49.0 (H) 04/19/2021 0951   LDLCALC 68 12/04/2016 0808   LDLDIRECT 72.0 04/19/2021 0951      Wt Readings from Last 3 Encounters:  03/17/22 139 lb (63 kg)  09/23/21 140 lb (63.5 kg)  12/14/20 139 lb 8 oz (63.3 kg)           No data to display            ASSESSMENT AND PLAN:  1.  Coronary artery disease involving native coronary arteries with stable angina: She is doing reasonably well overall with controlled symptoms.  She is stable on a beta-blocker and long-acting nitroglycerin.  Both of these were refilled today.  She is on long-term clopidogrel without aspirin.  Continue medical therapy.    2.  Moderate aortic stenosis.  Clinically she seems to be stable and has no symptoms related to aortic stenosis.   Even though she is very functional, I do not think she will be a good candidate for TAVR in the future considering her age.     3.  Essential hypertension: Blood pressure is reasonably controlled on current medications   4.  Hyperlipidemia: Continue treatment with atorvastatin 40 mg once daily.  Most recent lipid profile showed an LDL of 72  5.   Chronic diastolic heart failure: She appears to be euvolemic.  She is not on a loop diuretic but takes hydrochlorothiazide 12.5 mg once a day.    Disposition:   FU with me in 6 months.  Signed,  [name], MD  03/20/2022 7:06 AM    [place]

## 2022-03-17 NOTE — Patient Instructions (Signed)

## 2022-03-29 ENCOUNTER — Other Ambulatory Visit: Payer: Self-pay | Admitting: Internal Medicine

## 2022-04-03 NOTE — Telephone Encounter (Signed)
Last OV: 08/24/2020 Next OV: None

## 2022-04-11 NOTE — Telephone Encounter (Signed)
This encounter was created in error - please disregard.

## 2022-04-18 ENCOUNTER — Other Ambulatory Visit: Payer: Self-pay | Admitting: Internal Medicine

## 2022-05-03 DIAGNOSIS — Z961 Presence of intraocular lens: Secondary | ICD-10-CM | POA: Diagnosis not present

## 2022-05-03 DIAGNOSIS — E119 Type 2 diabetes mellitus without complications: Secondary | ICD-10-CM | POA: Diagnosis not present

## 2022-05-03 LAB — HM DIABETES EYE EXAM

## 2022-05-08 ENCOUNTER — Other Ambulatory Visit: Payer: Self-pay | Admitting: Internal Medicine

## 2022-05-08 DIAGNOSIS — M79651 Pain in right thigh: Secondary | ICD-10-CM

## 2022-05-08 NOTE — Progress Notes (Unsigned)
Dg hip

## 2022-05-09 ENCOUNTER — Ambulatory Visit (INDEPENDENT_AMBULATORY_CARE_PROVIDER_SITE_OTHER): Payer: Medicare Other

## 2022-05-09 ENCOUNTER — Other Ambulatory Visit: Payer: Medicare Other

## 2022-05-09 DIAGNOSIS — M79651 Pain in right thigh: Secondary | ICD-10-CM

## 2022-05-09 DIAGNOSIS — M25551 Pain in right hip: Secondary | ICD-10-CM | POA: Diagnosis not present

## 2022-05-10 ENCOUNTER — Other Ambulatory Visit: Payer: Self-pay | Admitting: Internal Medicine

## 2022-06-26 ENCOUNTER — Other Ambulatory Visit: Payer: Self-pay | Admitting: Internal Medicine

## 2022-06-26 DIAGNOSIS — N3946 Mixed incontinence: Secondary | ICD-10-CM

## 2022-06-26 MED ORDER — CIPROFLOXACIN HCL 250 MG PO TABS: 250.0000 mg | ORAL_TABLET | Freq: Two times a day (BID) | ORAL | 0 refills | Status: AC

## 2022-06-27 ENCOUNTER — Other Ambulatory Visit (INDEPENDENT_AMBULATORY_CARE_PROVIDER_SITE_OTHER): Payer: Medicare Other

## 2022-06-27 DIAGNOSIS — N3946 Mixed incontinence: Secondary | ICD-10-CM

## 2022-06-27 LAB — URINALYSIS, ROUTINE W REFLEX MICROSCOPIC
Bilirubin Urine: NEGATIVE
Hgb urine dipstick: NEGATIVE
Ketones, ur: NEGATIVE
Leukocytes,Ua: NEGATIVE
Nitrite: NEGATIVE
RBC / HPF: NONE SEEN (ref 0–?)
Specific Gravity, Urine: 1.01 (ref 1.000–1.030)
Total Protein, Urine: NEGATIVE
Urine Glucose: NEGATIVE
Urobilinogen, UA: 0.2 (ref 0.0–1.0)
pH: 5.5 (ref 5.0–8.0)

## 2022-06-28 LAB — URINE CULTURE
MICRO NUMBER:: 14620571
SPECIMEN QUALITY:: ADEQUATE

## 2022-06-30 ENCOUNTER — Other Ambulatory Visit (INDEPENDENT_AMBULATORY_CARE_PROVIDER_SITE_OTHER): Payer: Medicare Other

## 2022-06-30 ENCOUNTER — Other Ambulatory Visit: Payer: Self-pay | Admitting: Internal Medicine

## 2022-06-30 DIAGNOSIS — R0602 Shortness of breath: Secondary | ICD-10-CM

## 2022-06-30 DIAGNOSIS — D649 Anemia, unspecified: Secondary | ICD-10-CM | POA: Diagnosis not present

## 2022-06-30 DIAGNOSIS — E1121 Type 2 diabetes mellitus with diabetic nephropathy: Secondary | ICD-10-CM | POA: Diagnosis not present

## 2022-06-30 LAB — CBC WITH DIFFERENTIAL/PLATELET
Basophils Absolute: 0 10*3/uL (ref 0.0–0.1)
Basophils Relative: 0.2 % (ref 0.0–3.0)
Eosinophils Absolute: 0.5 10*3/uL (ref 0.0–0.7)
Eosinophils Relative: 4.9 % (ref 0.0–5.0)
HCT: 30.2 % — ABNORMAL LOW (ref 36.0–46.0)
Hemoglobin: 10 g/dL — ABNORMAL LOW (ref 12.0–15.0)
Lymphocytes Relative: 8.4 % — ABNORMAL LOW (ref 12.0–46.0)
Lymphs Abs: 1 10*3/uL (ref 0.7–4.0)
MCHC: 33.2 g/dL (ref 30.0–36.0)
MCV: 82.8 fl (ref 78.0–100.0)
Monocytes Absolute: 0.8 10*3/uL (ref 0.1–1.0)
Monocytes Relative: 7.3 % (ref 3.0–12.0)
Neutro Abs: 9 10*3/uL — ABNORMAL HIGH (ref 1.4–7.7)
Neutrophils Relative %: 79.2 % — ABNORMAL HIGH (ref 43.0–77.0)
Platelets: 230 10*3/uL (ref 150.0–400.0)
RBC: 3.65 Mil/uL — ABNORMAL LOW (ref 3.87–5.11)
RDW: 13.7 % (ref 11.5–15.5)
WBC: 11.3 10*3/uL — ABNORMAL HIGH (ref 4.0–10.5)

## 2022-06-30 LAB — COMPREHENSIVE METABOLIC PANEL
ALT: 11 U/L (ref 0–35)
AST: 13 U/L (ref 0–37)
Albumin: 3.7 g/dL (ref 3.5–5.2)
Alkaline Phosphatase: 85 U/L (ref 39–117)
BUN: 49 mg/dL — ABNORMAL HIGH (ref 6–23)
CO2: 20 mEq/L (ref 19–32)
Calcium: 9.1 mg/dL (ref 8.4–10.5)
Chloride: 100 mEq/L (ref 96–112)
Creatinine, Ser: 1.37 mg/dL — ABNORMAL HIGH (ref 0.40–1.20)
GFR: 32.41 mL/min — ABNORMAL LOW (ref >60.00)
Glucose, Bld: 224 mg/dL — ABNORMAL HIGH (ref 70–99)
Potassium: 3.3 mEq/L — ABNORMAL LOW (ref 3.5–5.1)
Sodium: 133 mEq/L — ABNORMAL LOW (ref 135–145)
Total Bilirubin: 0.4 mg/dL (ref 0.2–1.2)
Total Protein: 6.2 g/dL (ref 6.0–8.3)

## 2022-06-30 LAB — B12 AND FOLATE PANEL
Folate: 10 ng/mL (ref >5.9)
Vitamin B-12: 374 pg/mL (ref 211–911)

## 2022-06-30 LAB — BRAIN NATRIURETIC PEPTIDE: Pro B Natriuretic peptide (BNP): 228 pg/mL — ABNORMAL HIGH (ref 0.0–100.0)

## 2022-06-30 LAB — HEMOGLOBIN A1C: Hgb A1c MFr Bld: 7.3 % — ABNORMAL HIGH (ref 4.6–6.5)

## 2022-07-02 ENCOUNTER — Other Ambulatory Visit: Payer: Self-pay | Admitting: Internal Medicine

## 2022-07-02 DIAGNOSIS — E611 Iron deficiency: Secondary | ICD-10-CM | POA: Insufficient documentation

## 2022-07-02 MED ORDER — IRON (FERROUS SULFATE) 325 (65 FE) MG PO TABS: 325.0000 mg | ORAL_TABLET | Freq: Every day | ORAL | 0 refills | Status: DC

## 2022-07-03 ENCOUNTER — Other Ambulatory Visit: Payer: Self-pay | Admitting: Internal Medicine

## 2022-07-03 LAB — IRON,TIBC AND FERRITIN PANEL
%SAT: 13 % (calc) — ABNORMAL LOW (ref 16–45)
Ferritin: 207 ng/mL (ref 16–288)
Iron: 28 ug/dL — ABNORMAL LOW (ref 45–160)
TIBC: 217 mcg/dL (calc) — ABNORMAL LOW (ref 250–450)

## 2022-07-03 LAB — METHYLMALONIC ACID, SERUM: Methylmalonic Acid, Quant: 168 nmol/L (ref 87–318)

## 2022-07-03 MED ORDER — IRON (FERROUS SULFATE) 325 (65 FE) MG PO TABS: 325.0000 mg | ORAL_TABLET | Freq: Every day | ORAL | 0 refills | Status: DC

## 2022-07-04 ENCOUNTER — Other Ambulatory Visit: Payer: Self-pay

## 2022-07-04 MED ORDER — IRON (FERROUS SULFATE) 325 (65 FE) MG PO TABS: 325.0000 mg | ORAL_TABLET | Freq: Every day | ORAL | 0 refills | Status: DC

## 2022-07-06 ENCOUNTER — Telehealth: Payer: Self-pay | Admitting: Cardiovascular Disease

## 2022-07-06 NOTE — Telephone Encounter (Signed)
Spoke with patient daughter and she stated that her mother was experiencing some SOB when rising from wheelchair to ambulate to restroom. Daughter stated that her mother had labs drawn and her iron was really low. Informed daughter that having low iron can affect the body's ability to carry oxygen throughout the body. Able to schedule patient Tuesday 07/11/22. Informed daughter of the following while waiting for appointment: Find position of greatest comfort, for most patients the best position is semi-upright (e.g., sitting up in a comfortable chair or lying back against pillows). Elevate head of bed (e.g., use pillows or place blocks under bed). Avoid smoke or fume exposure. Create a draft (e.g., use a fan directed at the face, or open a window). Keep room temperature slightly on the cool side. Limit activities or space activities apart during the day. Prioritize activities and use a humidifier. Daughter understood with read back

## 2022-07-06 NOTE — Telephone Encounter (Signed)
Pt c/o Shortness Of Breath: STAT if SOB developed within the last 24 hours or pt is noticeably SOB on the phone  1. Are you currently SOB (can you hear that pt is SOB on the phone)?  Not currently with the patient   2. How long have you been experiencing SOB?  Since yesterday afternoon   3. Are you SOB when sitting or when up moving around?  Both, but mainly when up and moving around  4. Are you currently experiencing any other symptoms?  Low iron

## 2022-07-07 ENCOUNTER — Inpatient Hospital Stay
Admission: EM | Admit: 2022-07-07 | Discharge: 2022-07-10 | DRG: 280 | Disposition: A | Discharge status: Home Health | Payer: Medicare Other | Attending: Internal Medicine | Admitting: Internal Medicine

## 2022-07-07 ENCOUNTER — Observation Stay (HOSPITAL_COMMUNITY)
Admit: 2022-07-07 | Discharge: 2022-07-07 | Disposition: A | Discharge status: Home / Self Care | Payer: Medicare Other | Attending: Internal Medicine | Admitting: Internal Medicine

## 2022-07-07 ENCOUNTER — Emergency Department: Payer: Medicare Other

## 2022-07-07 ENCOUNTER — Other Ambulatory Visit: Payer: Self-pay

## 2022-07-07 ENCOUNTER — Encounter: Payer: Self-pay | Admitting: Intensive Care

## 2022-07-07 DIAGNOSIS — I5032 Chronic diastolic (congestive) heart failure: Secondary | ICD-10-CM | POA: Diagnosis present

## 2022-07-07 DIAGNOSIS — E1122 Type 2 diabetes mellitus with diabetic chronic kidney disease: Secondary | ICD-10-CM | POA: Diagnosis present

## 2022-07-07 DIAGNOSIS — I252 Old myocardial infarction: Secondary | ICD-10-CM | POA: Diagnosis not present

## 2022-07-07 DIAGNOSIS — I10 Essential (primary) hypertension: Secondary | ICD-10-CM | POA: Diagnosis present

## 2022-07-07 DIAGNOSIS — I2489 Other forms of acute ischemic heart disease: Secondary | ICD-10-CM | POA: Diagnosis not present

## 2022-07-07 DIAGNOSIS — Z823 Family history of stroke: Secondary | ICD-10-CM

## 2022-07-07 DIAGNOSIS — N1831 Chronic kidney disease, stage 3a: Secondary | ICD-10-CM | POA: Diagnosis present

## 2022-07-07 DIAGNOSIS — I350 Nonrheumatic aortic (valve) stenosis: Secondary | ICD-10-CM | POA: Diagnosis not present

## 2022-07-07 DIAGNOSIS — I160 Hypertensive urgency: Secondary | ICD-10-CM | POA: Diagnosis present

## 2022-07-07 DIAGNOSIS — N179 Acute kidney failure, unspecified: Secondary | ICD-10-CM | POA: Diagnosis present

## 2022-07-07 DIAGNOSIS — J849 Interstitial pulmonary disease, unspecified: Secondary | ICD-10-CM | POA: Diagnosis present

## 2022-07-07 DIAGNOSIS — E119 Type 2 diabetes mellitus without complications: Secondary | ICD-10-CM | POA: Diagnosis not present

## 2022-07-07 DIAGNOSIS — Z881 Allergy status to other antibiotic agents status: Secondary | ICD-10-CM | POA: Diagnosis not present

## 2022-07-07 DIAGNOSIS — I5033 Acute on chronic diastolic (congestive) heart failure: Secondary | ICD-10-CM | POA: Diagnosis present

## 2022-07-07 DIAGNOSIS — I080 Rheumatic disorders of both mitral and aortic valves: Secondary | ICD-10-CM | POA: Diagnosis present

## 2022-07-07 DIAGNOSIS — D509 Iron deficiency anemia, unspecified: Secondary | ICD-10-CM | POA: Diagnosis present

## 2022-07-07 DIAGNOSIS — E876 Hypokalemia: Secondary | ICD-10-CM | POA: Diagnosis present

## 2022-07-07 DIAGNOSIS — D649 Anemia, unspecified: Secondary | ICD-10-CM | POA: Diagnosis present

## 2022-07-07 DIAGNOSIS — Z880 Allergy status to penicillin: Secondary | ICD-10-CM

## 2022-07-07 DIAGNOSIS — E1169 Type 2 diabetes mellitus with other specified complication: Secondary | ICD-10-CM | POA: Diagnosis present

## 2022-07-07 DIAGNOSIS — Z8616 Personal history of COVID-19: Secondary | ICD-10-CM | POA: Diagnosis not present

## 2022-07-07 DIAGNOSIS — E785 Hyperlipidemia, unspecified: Secondary | ICD-10-CM | POA: Diagnosis present

## 2022-07-07 DIAGNOSIS — I130 Hypertensive heart and chronic kidney disease with heart failure and stage 1 through stage 4 chronic kidney disease, or unspecified chronic kidney disease: Secondary | ICD-10-CM | POA: Diagnosis present

## 2022-07-07 DIAGNOSIS — Z7984 Long term (current) use of oral hypoglycemic drugs: Secondary | ICD-10-CM

## 2022-07-07 DIAGNOSIS — I25110 Atherosclerotic heart disease of native coronary artery with unstable angina pectoris: Secondary | ICD-10-CM | POA: Diagnosis present

## 2022-07-07 DIAGNOSIS — I509 Heart failure, unspecified: Secondary | ICD-10-CM | POA: Diagnosis not present

## 2022-07-07 DIAGNOSIS — Z66 Do not resuscitate: Secondary | ICD-10-CM | POA: Diagnosis present

## 2022-07-07 DIAGNOSIS — Z888 Allergy status to other drugs, medicaments and biological substances status: Secondary | ICD-10-CM

## 2022-07-07 DIAGNOSIS — Z885 Allergy status to narcotic agent status: Secondary | ICD-10-CM

## 2022-07-07 DIAGNOSIS — Z79899 Other long term (current) drug therapy: Secondary | ICD-10-CM

## 2022-07-07 DIAGNOSIS — R0602 Shortness of breath: Secondary | ICD-10-CM | POA: Diagnosis not present

## 2022-07-07 DIAGNOSIS — R079 Chest pain, unspecified: Principal | ICD-10-CM | POA: Diagnosis present

## 2022-07-07 DIAGNOSIS — E871 Hypo-osmolality and hyponatremia: Secondary | ICD-10-CM | POA: Diagnosis present

## 2022-07-07 DIAGNOSIS — I060 Rheumatic aortic stenosis: Secondary | ICD-10-CM | POA: Diagnosis present

## 2022-07-07 DIAGNOSIS — R778 Other specified abnormalities of plasma proteins: Secondary | ICD-10-CM | POA: Diagnosis not present

## 2022-07-07 DIAGNOSIS — Z809 Family history of malignant neoplasm, unspecified: Secondary | ICD-10-CM

## 2022-07-07 DIAGNOSIS — E1165 Type 2 diabetes mellitus with hyperglycemia: Secondary | ICD-10-CM | POA: Diagnosis present

## 2022-07-07 DIAGNOSIS — Z1152 Encounter for screening for COVID-19: Secondary | ICD-10-CM

## 2022-07-07 DIAGNOSIS — Z803 Family history of malignant neoplasm of breast: Secondary | ICD-10-CM

## 2022-07-07 DIAGNOSIS — K219 Gastro-esophageal reflux disease without esophagitis: Secondary | ICD-10-CM | POA: Diagnosis present

## 2022-07-07 DIAGNOSIS — J45909 Unspecified asthma, uncomplicated: Secondary | ICD-10-CM | POA: Diagnosis present

## 2022-07-07 DIAGNOSIS — I214 Non-ST elevation (NSTEMI) myocardial infarction: Principal | ICD-10-CM | POA: Diagnosis present

## 2022-07-07 DIAGNOSIS — R0789 Other chest pain: Secondary | ICD-10-CM | POA: Diagnosis not present

## 2022-07-07 DIAGNOSIS — I25118 Atherosclerotic heart disease of native coronary artery with other forms of angina pectoris: Secondary | ICD-10-CM | POA: Diagnosis not present

## 2022-07-07 DIAGNOSIS — E1151 Type 2 diabetes mellitus with diabetic peripheral angiopathy without gangrene: Secondary | ICD-10-CM | POA: Diagnosis present

## 2022-07-07 DIAGNOSIS — Z96641 Presence of right artificial hip joint: Secondary | ICD-10-CM | POA: Diagnosis present

## 2022-07-07 DIAGNOSIS — R54 Age-related physical debility: Secondary | ICD-10-CM | POA: Diagnosis present

## 2022-07-07 DIAGNOSIS — F32A Depression, unspecified: Secondary | ICD-10-CM | POA: Diagnosis present

## 2022-07-07 DIAGNOSIS — Z7902 Long term (current) use of antithrombotics/antiplatelets: Secondary | ICD-10-CM

## 2022-07-07 DIAGNOSIS — D6489 Other specified anemias: Secondary | ICD-10-CM | POA: Diagnosis not present

## 2022-07-07 DIAGNOSIS — H9192 Unspecified hearing loss, left ear: Secondary | ICD-10-CM | POA: Diagnosis present

## 2022-07-07 DIAGNOSIS — I2510 Atherosclerotic heart disease of native coronary artery without angina pectoris: Secondary | ICD-10-CM | POA: Diagnosis present

## 2022-07-07 DIAGNOSIS — Z882 Allergy status to sulfonamides status: Secondary | ICD-10-CM

## 2022-07-07 DIAGNOSIS — Z800 Family history of malignant neoplasm of digestive organs: Secondary | ICD-10-CM

## 2022-07-07 LAB — URINALYSIS, COMPLETE (UACMP) WITH MICROSCOPIC
Bilirubin Urine: NEGATIVE
Glucose, UA: NEGATIVE mg/dL
Hgb urine dipstick: NEGATIVE
Ketones, ur: NEGATIVE mg/dL
Nitrite: NEGATIVE
Protein, ur: 30 mg/dL — AB
Specific Gravity, Urine: 1.011 (ref 1.005–1.030)
pH: 5 (ref 5.0–8.0)

## 2022-07-07 LAB — COMPREHENSIVE METABOLIC PANEL
ALT: 15 U/L (ref 0–44)
AST: 18 U/L (ref 15–41)
Albumin: 3.3 g/dL — ABNORMAL LOW (ref 3.5–5.0)
Alkaline Phosphatase: 82 U/L (ref 38–126)
Anion gap: 10 (ref 5–15)
BUN: 40 mg/dL — ABNORMAL HIGH (ref 8–23)
CO2: 19 mmol/L — ABNORMAL LOW (ref 22–32)
Calcium: 8.9 mg/dL (ref 8.9–10.3)
Chloride: 100 mmol/L (ref 98–111)
Creatinine, Ser: 1.22 mg/dL — ABNORMAL HIGH (ref 0.44–1.00)
GFR, Estimated: 40 mL/min — ABNORMAL LOW (ref >60)
Glucose, Bld: 204 mg/dL — ABNORMAL HIGH (ref 70–99)
Potassium: 3.6 mmol/L (ref 3.5–5.1)
Sodium: 129 mmol/L — ABNORMAL LOW (ref 135–145)
Total Bilirubin: 0.7 mg/dL (ref 0.3–1.2)
Total Protein: 6.5 g/dL (ref 6.5–8.1)

## 2022-07-07 LAB — ECHOCARDIOGRAM COMPLETE
AR max vel: 1.22 cm2
AV Area VTI: 1.16 cm2
AV Area mean vel: 1.04 cm2
AV Mean grad: 25.5 mmHg
AV Peak grad: 46.3 mmHg
Ao pk vel: 3.4 m/s
Area-P 1/2: 2.36 cm2
Height: 61 in
MV VTI: 2.25 cm2
S' Lateral: 1.8 cm
Weight: 2240 oz

## 2022-07-07 LAB — GLUCOSE, CAPILLARY: Glucose-Capillary: 82 mg/dL (ref 70–99)

## 2022-07-07 LAB — RESP PANEL BY RT-PCR (RSV, FLU A&B, COVID)  RVPGX2
Influenza A by PCR: NEGATIVE
Influenza B by PCR: NEGATIVE
Resp Syncytial Virus by PCR: NEGATIVE
SARS Coronavirus 2 by RT PCR: NEGATIVE

## 2022-07-07 LAB — PROTIME-INR
INR: 1.2 (ref 0.8–1.2)
Prothrombin Time: 15.2 seconds (ref 11.4–15.2)

## 2022-07-07 LAB — CBC
HCT: 30.7 % — ABNORMAL LOW (ref 36.0–46.0)
Hemoglobin: 10.2 g/dL — ABNORMAL LOW (ref 12.0–15.0)
MCH: 27.3 pg (ref 26.0–34.0)
MCHC: 33.2 g/dL (ref 30.0–36.0)
MCV: 82.3 fL (ref 80.0–100.0)
Platelets: 226 10*3/uL (ref 150–400)
RBC: 3.73 MIL/uL — ABNORMAL LOW (ref 3.87–5.11)
RDW: 13.1 % (ref 11.5–15.5)
WBC: 12.2 10*3/uL — ABNORMAL HIGH (ref 4.0–10.5)
nRBC: 0 % (ref 0.0–0.2)

## 2022-07-07 LAB — BRAIN NATRIURETIC PEPTIDE: B Natriuretic Peptide: 300.1 pg/mL — ABNORMAL HIGH (ref 0.0–100.0)

## 2022-07-07 LAB — HEPARIN LEVEL (UNFRACTIONATED): Heparin Unfractionated: 0.35 IU/mL (ref 0.30–0.70)

## 2022-07-07 LAB — IRON AND TIBC
Iron: 29 ug/dL (ref 28–170)
Saturation Ratios: 13 % (ref 10.4–31.8)
TIBC: 221 ug/dL — ABNORMAL LOW (ref 250–450)
UIBC: 192 ug/dL

## 2022-07-07 LAB — FERRITIN: Ferritin: 205 ng/mL (ref 11–307)

## 2022-07-07 LAB — CBG MONITORING, ED: Glucose-Capillary: 163 mg/dL — ABNORMAL HIGH (ref 70–99)

## 2022-07-07 LAB — APTT: aPTT: 38 seconds — ABNORMAL HIGH (ref 24–36)

## 2022-07-07 LAB — TROPONIN I (HIGH SENSITIVITY)
Troponin I (High Sensitivity): 550 ng/L (ref <18)
Troponin I (High Sensitivity): 518 ng/L (ref <18)

## 2022-07-07 MED ORDER — CLOPIDOGREL BISULFATE 75 MG PO TABS
75.0000 mg | ORAL_TABLET | Freq: Every day | ORAL | Status: DC
  Administered 2022-07-08 – 2022-07-10 (×3): 75 mg via ORAL
  Filled 2022-07-07 (×3): qty 1

## 2022-07-07 MED ORDER — CARVEDILOL 6.25 MG PO TABS
6.2500 mg | ORAL_TABLET | Freq: Two times a day (BID) | ORAL | Status: DC
  Administered 2022-07-07 – 2022-07-10 (×5): 6.25 mg via ORAL
  Filled 2022-07-07 (×6): qty 1

## 2022-07-07 MED ORDER — ONDANSETRON HCL 4 MG/2ML IJ SOLN: 4.0000 mg | Freq: Four times a day (QID) | INTRAMUSCULAR | Status: DC | PRN

## 2022-07-07 MED ORDER — LORATADINE 10 MG PO TABS
10.0000 mg | ORAL_TABLET | Freq: Every day | ORAL | Status: DC
  Administered 2022-07-08 – 2022-07-10 (×3): 10 mg via ORAL
  Filled 2022-07-07 (×3): qty 1

## 2022-07-07 MED ORDER — SODIUM CHLORIDE 0.9 % IV SOLN
300.0000 mg | Freq: Once | INTRAVENOUS | Status: AC
  Administered 2022-07-07: 300 mg via INTRAVENOUS
  Filled 2022-07-07: qty 300

## 2022-07-07 MED ORDER — HEPARIN BOLUS VIA INFUSION
3500.0000 [IU] | Freq: Once | INTRAVENOUS | Status: AC
  Administered 2022-07-07: 3500 [IU] via INTRAVENOUS
  Filled 2022-07-07: qty 3500

## 2022-07-07 MED ORDER — ISOSORBIDE MONONITRATE ER 60 MG PO TB24
60.0000 mg | ORAL_TABLET | Freq: Every day | ORAL | Status: DC
  Administered 2022-07-09 – 2022-07-10 (×2): 60 mg via ORAL
  Filled 2022-07-07 (×3): qty 1

## 2022-07-07 MED ORDER — ATORVASTATIN CALCIUM 20 MG PO TABS
40.0000 mg | ORAL_TABLET | Freq: Every day | ORAL | Status: DC
  Administered 2022-07-07 – 2022-07-09 (×3): 40 mg via ORAL
  Filled 2022-07-07 (×4): qty 2

## 2022-07-07 MED ORDER — INSULIN ASPART 100 UNIT/ML IJ SOLN
0.0000 [IU] | Freq: Three times a day (TID) | INTRAMUSCULAR | Status: DC
  Administered 2022-07-07 – 2022-07-08 (×2): 2 [IU] via SUBCUTANEOUS
  Administered 2022-07-08: 1 [IU] via SUBCUTANEOUS
  Administered 2022-07-08: 2 [IU] via SUBCUTANEOUS
  Administered 2022-07-09: 9 [IU] via SUBCUTANEOUS
  Administered 2022-07-09 (×2): 3 [IU] via SUBCUTANEOUS
  Administered 2022-07-10: 7 [IU] via SUBCUTANEOUS
  Filled 2022-07-07 (×8): qty 1

## 2022-07-07 MED ORDER — LOSARTAN POTASSIUM 50 MG PO TABS
100.0000 mg | ORAL_TABLET | Freq: Every day | ORAL | Status: DC
  Administered 2022-07-07 – 2022-07-10 (×3): 100 mg via ORAL
  Filled 2022-07-07 (×4): qty 2

## 2022-07-07 MED ORDER — ACETAMINOPHEN 325 MG PO TABS: 650.0000 mg | ORAL_TABLET | ORAL | Status: DC | PRN

## 2022-07-07 MED ORDER — HEPARIN (PORCINE) 25000 UT/250ML-% IV SOLN
850.0000 [IU]/h | INTRAVENOUS | Status: AC
  Administered 2022-07-07: 700 [IU]/h via INTRAVENOUS
  Administered 2022-07-08: 850 [IU]/h via INTRAVENOUS
  Filled 2022-07-07 (×2): qty 250

## 2022-07-07 MED ORDER — HYDRALAZINE HCL 25 MG PO TABS
25.0000 mg | ORAL_TABLET | Freq: Three times a day (TID) | ORAL | Status: DC
  Administered 2022-07-07 – 2022-07-10 (×9): 25 mg via ORAL
  Filled 2022-07-07 (×9): qty 1

## 2022-07-07 MED ORDER — INSULIN ASPART 100 UNIT/ML IJ SOLN
0.0000 [IU] | Freq: Every day | INTRAMUSCULAR | Status: DC
  Administered 2022-07-08: 4 [IU] via SUBCUTANEOUS
  Administered 2022-07-09: 3 [IU] via SUBCUTANEOUS
  Filled 2022-07-07 (×2): qty 1

## 2022-07-07 MED ORDER — NITROGLYCERIN 0.4 MG SL SUBL: 0.4000 mg | SUBLINGUAL_TABLET | SUBLINGUAL | Status: DC | PRN

## 2022-07-07 MED ORDER — PANTOPRAZOLE SODIUM 40 MG PO TBEC
40.0000 mg | DELAYED_RELEASE_TABLET | Freq: Every day | ORAL | Status: DC
  Administered 2022-07-08 – 2022-07-10 (×3): 40 mg via ORAL
  Filled 2022-07-07 (×4): qty 1

## 2022-07-07 MED ORDER — SODIUM CHLORIDE 0.9% FLUSH
3.0000 mL | Freq: Two times a day (BID) | INTRAVENOUS | Status: DC
  Administered 2022-07-07 – 2022-07-09 (×6): 3 mL via INTRAVENOUS

## 2022-07-07 MED ORDER — SODIUM CHLORIDE 0.9 % IV SOLN: 250.0000 mL | INTRAVENOUS | Status: DC | PRN

## 2022-07-07 MED ORDER — SODIUM CHLORIDE 0.9% FLUSH: 3.0000 mL | INTRAVENOUS | Status: DC | PRN

## 2022-07-07 MED ORDER — FUROSEMIDE 10 MG/ML IJ SOLN
20.0000 mg | Freq: Two times a day (BID) | INTRAMUSCULAR | Status: DC
  Administered 2022-07-07 (×2): 20 mg via INTRAVENOUS
  Filled 2022-07-07: qty 4
  Filled 2022-07-07: qty 2

## 2022-07-07 MED ORDER — SERTRALINE HCL 50 MG PO TABS
100.0000 mg | ORAL_TABLET | Freq: Every day | ORAL | Status: DC
  Administered 2022-07-08 – 2022-07-10 (×3): 100 mg via ORAL
  Filled 2022-07-07 (×3): qty 2

## 2022-07-07 NOTE — ED Notes (Signed)
ECHO at bedside.

## 2022-07-07 NOTE — Consult Note (Signed)
Maureen Morales for heparin drip Indication: chest pain/ACS  Allergies  Allergen Reactions   Clindamycin     dizziness   Oxytetracycline Hives   Phenobarbital Hives   Atropine Other (See Comments)    Reaction: unknown   Belladonna Alkaloids Other (See Comments)    Reaction: unknown   Codeine Nausea And Vomiting   Darvon [Propoxyphene] Nausea And Vomiting   Demerol [Meperidine] Nausea And Vomiting   Methocarbamol Other (See Comments)    Reaction: unknown   Penicillins Other (See Comments)    Pt and family unsure of details.  Can tolerate cephalosporins   Sulfa Antibiotics Other (See Comments)    Reaction: unknown   Iron Other (See Comments)    High dose prescription gives her diarrhea    Patient Measurements: Height: '5\' 1"'$  (154.9 cm) Weight: 63.5 kg (140 lb) IBW/kg (Calculated) : 47.8 Heparin Dosing Weight: 60.9 kg  Vital Signs: Temp: 97.9 F (36.6 C) (03/08 1929) Temp Source: Oral (03/08 1844) BP: 142/74 (03/08 1929) Pulse Rate: 76 (03/08 1929)  Labs: Recent Labs    07/07/22 1035 07/07/22 1120 07/07/22 1135 07/07/22 1945  HGB 10.2*  --   --   --   HCT 30.7*  --   --   --   PLT 226  --   --   --   APTT  --   --  38*  --   LABPROT  --   --  15.2  --   INR  --   --  1.2  --   HEPARINUNFRC  --   --   --  0.35  CREATININE 1.22*  --   --   --   TROPONINIHS 550* 518*  --   --      Estimated Creatinine Clearance: 22.5 mL/min (A) (by C-G formula based on SCr of 1.22 mg/dL (H)).   Medical History: Past Medical History:  Diagnosis Date   Aortic stenosis    a. 08/2016 Echo: Hyperdynamic LV fxn, mild AS (mean grad 2mHg), mild MR, mild PAH; b. 05/2017 Echo: Hyperdynamic LV fxn, mod AS (mean grad 264mg, valve area 1.84). PASP 10058m.   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 08/2017 Renal U/S: No RAS.   Low back pain with left-sided sciatica 04/29/2018   [facility]   Non-obstructive CAD (coronary artery disease)    a. 08/2016 NSTEMI/Cath: mild to mod nonobs dzs including 60% mRCA stenosis-->Med rx.   NSTEMI (non-ST elevated myocardial infarction) ([facility]/06/[place]   NSTEMI (non-ST elevated myocardial infarction) ([facility]/20/2022   Nummular eczema 01/16/2011   Occurring on chest and under breast.  Treated by Dr DasEvorn Gongth fluocinonide 0.05% topical 06/26/12    Peripheral vascular disease ([facility]  Periprosthetic fracture around internal prosthetic hip joint, initial encounter    PONV (postoperative nausea and vomiting)    Positive H. pylori test    Pre-diabetes  Suspected COVID-19 virus infection 01/10/2019   Patient reporting malaise and headache for the past 3 days,  Similar to presentation when she was positive for  COVID 19 INFECTION  July 2 .  Previous inection was caused by exposure to COVID positive caregiver.  Repeat scenario:  Same caregiver's mother tested positive on Monday and caregiver saw current patient on Tuesday     Viral gastroenteritis due to [place]-like agent Nov 2012    Medications:  No home anticoagulation per pharmacist review  Assessment: 89 yo female presented to ED with chest pain.  PMH includes HTN, HLD, and CAD.  Patient found to have elevated troponin so pharmacy consulted to initiate heparin infusion.  Date Time HL Rate/Comment 3/8 1945 0.35 Therapeutic x1 / 700 u/hr  Goal of Therapy:  Heparin level 0.3-0.7 units/ml Monitor platelets by anticoagulation protocol: Yes   Plan:  Continue heparin infusion at 700  units/hr Check anti-Xa level in 8 hours and daily once consecutively therapeutic. Continue to monitor H&H and platelets daily while on heparin gtt.   [place] Pharmacist 07/07/2022 10:04 PM

## 2022-07-07 NOTE — ED Notes (Signed)
Troponin of 550 reported to Dr Kerman Passey.

## 2022-07-07 NOTE — Plan of Care (Signed)
  Problem: Coping: Goal: Ability to adjust to condition or change in health will improve Outcome: Progressing   

## 2022-07-07 NOTE — ED Triage Notes (Signed)
Patient c/o sob and chest pain that started today.

## 2022-07-07 NOTE — Consult Note (Signed)
Cardiology Consultation   Patient ID: BIANCCA BEBBER MRN: [id number]; DOB: 07/16/1922  Admit date: 07/07/2022 Date of Consult: 07/07/2022  PCP:  [name], MD   [name] Cardiologist:  [name], MD        Patient Profile:   Maureen Morales is a 89 y.o. female with a hx of aortic stenosis, coronary artery disease, hypertension, hyperlipidemia, type 2 diabetes, iron deficiency anemia, who is being seen 07/07/2022 for the evaluation of shortness of breath and elevated high sensitivity troponins at the request of Dr. Priscella Mann.  History of Present Illness:   Ms. Maureen Morales is a 89 year old female with a past medical history of aortic stenosis last echocardiogram in 07/2020 which revealed an LVEF of 60 to 65%, moderate left ventricular hypertrophy, 1 DD, mild to moderate mitral regurgitation and mild to moderate aortic stenosis with mean gradient of 60 mmHg and a valve area of 1.7 cm, nonobstructive coronary artery disease on her last heart catheterization with your stenosis being 60% in the mid right coronary artery, hypertension with a difficult to control blood pressure due to intolerance to several antihypertensive medications, hyperlipidemia, type 2 diabetes.  She was last hospitalized in April 2022 with a non-ST elevated myocardial infarction setting of hypertensive urgency.  Peak high-sensitivity troponin was 1663, LVEF is 60 to 65%, and medical therapy was recommended with blood pressure control.  She was last seen in clinic 03/17/2022 by Dr. Fletcher Anon time and been doing very well overall with no recent hospitalizations.  Her blood pressure had been better controlled at home and she has had no recent episodes of chest pain or worsening dyspnea.  Patient presented to the [facility] emergency department this morning with a chief complaint of chest pain and shortness of breath.  She stated that she had noticed shortness of breath and fatigue as of late she even noticed that  turning over in bed she was short of breath and more winded.  According to the daughter who is at the bedside for the past 2 to 3 weeks her fatigue has been worse and she is just weak in general.  She saw her doctor 2 weeks prior and had blood work showing some anemia was started on iron supplements.  Around 1 AM she started developing pain in the center of her chest along with worsening shortness of breath.  She was given 1 nitroglycerin tablet and states significant resolution in her symptoms.  She also states that her right leg is a little bit more swollen than her left leg and due to double hip replacement.  And family was concerned that she may have had COVID as well and recently done a home swab that was negative for COVID.  She denies any other symptoms of nausea/vomiting,decreased appetite, abdominal pain, fever, chills.  Initial vital signs: Blood pressure 118/68, pulse of 75, respirations of 16, temperature 98.2  Pertinent labs: Respiratory panel is negative, WBCs 12.2, hemoglobin 10.2, hematocrit of 30.7, high-sensitivity troponin of 550 and 518, sodium 129, CO2 19, blood glucose of 204, BUN of 40, serum creatinine of 1.22, albumin of 3.3, BNP 300.1, urinalysis with moderate leukocytes and rare bacteria and protein  Imaging: Chest x-ray revealed hazy bibasilar airspace opacities which could represent atelectasis or infection with no pleural effusion   Past Medical History:  Diagnosis Date   Aortic stenosis    a. 08/2016 Echo: Hyperdynamic LV fxn, mild AS (mean grad 34mHg), mild MR, mild PAH; b. 05/2017 Echo: Hyperdynamic LV fxn,  mod AS (mean grad 25mHg, valve area 1.84). PASP 1060mg.   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 08/2017 Renal U/S: No RAS.   Low back pain with left-sided sciatica 04/29/2018   [facility]   Non-obstructive CAD (coronary artery disease)    a. 08/2016 NSTEMI/Cath: mild to mod nonobs dzs including 60% mRCA stenosis-->Med rx.   [place] (non-ST elevated myocardial infarction) [place]   NSTEMI (non-ST elevated myocardial infarction) ([facility]/20/2022   Nummular eczema 01/16/2011   Occurring on chest and under breast.  Treated by Dr DaEvorn Gongith fluocinonide 0.05% topical 06/26/12    [place]ipheral vascular disease [place] [place] [facility]   Periprosthetic fracture around internal prosthetic hip joint, initial encounter    PONV (postoperative nausea and vomiting)    Positive H. pylori test    Pre-diabetes    Suspected COVID-19 virus infection 01/10/2019   Patient reporting malaise and headache for the past 3 days,  Similar to presentation when she was positive for  COVID 19 INFECTION  July 2 .  Previous inection was caused by exposure to COVID positive caregiver.  Repeat scenario:  Same caregiver's mother tested positive on Monday and caregiver saw current patient on Tuesday     Viral gastroenteritis due to [place]-like agent Nov 2012    Past Surgical History:  Procedure Laterality Date   ABDOMINAL HYSTERECTOMY     BLADDER SURGERY     CARDIAC CATHETERIZATION     CATARACT EXTRACTION W/PHACO Right 09/29/2020   Procedure: CATARACT EXTRACTION PHACO AND INTRAOCULAR LENS PLACEMENT (IOVikingRIGHT VISION BLUE 7.87 00:58.7 ;  Surgeon: BrLeandrew KoyanagiMD;  [place] [facility] [facility];  Laterality: Right;   CATARACT EXTRACTION W/PHACO Left 10/13/2020   Procedure: CATARACT EXTRACTION PHACO AND INTRAOCULAR LENS PLACEMENT ([place] BL[place];  Surgeon: BrLeandrew KoyanagiMD;  [place]  [place] Service: Ophthalmology;  Laterality: Left;  4.94 1:03.0 7.9%   CHOLECYSTECTOMY     COLONOSCOPY     06/16/1987, 02/20/1995, 05/04/1999, 02/14/2005, 05/17/2007   ESOPHAGOGASTRODUODENOSCOPY     05/11/1987, 06/13/1995, 10/09/1995, 05/04/1999, 01/16/2002   FLEXIBLE SIGMOIDOSCOPY N/A 05/02/2017   Procedure: FLOtho DarnerIGMOIDOSCOPY;  Surgeon: ElManya SilvasMD;  [facility];  Service: Endoscopy;  Laterality: N/A;   HEMORRHOID SURGERY N/A 05/14/2017   Procedure: EXTERNAL THROMBOSED HEMORRHOIDECTOMY;  Surgeon: ByRobert BellowMD;  Location: [facility] ORS;  Service: General;  Laterality: N/A;   HIP ARTHROPLASTY Right 02/17/2017   Procedure: ARTHROPLASTY BIPOLAR HIP (HEMIARTHROPLASTY);  Surgeon: EcClaud KelpMD;  Location: [facility] ORS;  Service: Orthopedics;  Laterality: Right;   LEFT HEART CATH AND CORONARY ANGIOGRAPHY N/A 08/31/2016   Procedure: Left Heart Cath and Coronary Angiography;  Surgeon: MuWellington HampshireMD;  [place];  Service: Cardiovascular;  Laterality: N/A;   ORIF PERIPROSTHETIC FRACTURE Right 05/27/2017   ORIF PERIPROSTHETIC FRACTURE Right 05/27/2017   Procedure: OPEN REDUCTION INTERNAL FIXATION (ORIF) PERIPROSTHETIC FRACTURE;  Surgeon: Mcarthur Rossetti, MD;  Location: [place];  Service: Orthopedics;  Laterality: Right;   TOTAL HIP ARTHROPLASTY Right    uterian prolapse       Home Medications:  Prior to Admission medications   Medication Sig Start Date End Date Taking? Authorizing Provider  acetaminophen (TYLENOL) 500 MG tablet Take 1,000 mg by mouth every 6 (six) hours as needed for moderate pain or headache.   Yes [provider]  albuterol (PROVENTIL) (2.5 MG/3ML) 0.083% nebulizer solution Take 3 mLs (2.5 mg total) by nebulization every 6 (six) hours as needed for wheezing or shortness of breath. 11/24/21  Yes [name], MD  albuterol (VENTOLIN HFA) 108 (90 Base) MCG/ACT inhaler Inhale 2 puffs into the lungs  every 6 (six) hours as needed for wheezing or shortness of breath. 10/26/21  Yes [name], MD  atorvastatin (LIPITOR) 40 MG tablet TAKE 1 TABLET BY MOUTH DAILY Patient taking differently: Take 40 mg by mouth at bedtime. 03/10/22  Yes [name], MD  carvedilol (COREG) 6.25 MG tablet Take 1 tablet (6.25 mg total) by mouth 2 (two) times daily with a meal. 03/17/22  Yes Wellington Hampshire, MD  clopidogrel (PLAVIX) 75 MG tablet TAKE ONE TABLET BY MOUTH EVERY DAY 04/03/22  Yes [name], MD  CRANBERRY PO Take 2 capsules by mouth 2 (two) times daily.   Yes [provider]  famotidine (PEPCID) 10 MG tablet Take 10 mg by mouth daily as needed for heartburn or indigestion.   Yes [provider]  glipiZIDE (GLUCOTROL) 5 MG tablet TAKE ONE TABLET TWICE A DAY BEFORE MEALS 01/25/22  Yes [name], MD  hydrALAZINE (APRESOLINE) 25 MG tablet TAKE THREE TABLETS BY MOUTH WITH BREAKFAST  TAKE THREE TABLETS WITH LUNCHAND TAKE TWO TABLETS WITH DINNER AS DIRECTED 04/19/22  Yes [name], MD  hydrochlorothiazide (MICROZIDE) 12.5 MG capsule TAKE 1 CAPSULE BY MOUTH ONCE DAILY 05/10/22  Yes [name], MD  Iron, Ferrous Sulfate, 325 (65 Fe) MG TABS Take 325 mg by mouth daily. 07/04/22  Yes [name], MD  isosorbide mononitrate (IMDUR) 30 MG 24 hr tablet TAKE ONE TABLET BY MOUTH EVERY MORNING AND TAKE TWO TABLETS EVERY EVENING 03/17/22  Yes Wellington Hampshire, MD  Lactobacillus (ULTIMATE PROBIOTIC FORMULA) CAPS Take 1 capsule by mouth daily with supper.   Yes [provider]  lansoprazole (PREVACID) 30 MG capsule TAKE 1 CAPSULE EVERY DAY BEFORE SUPPER Patient taking differently: Take 30 mg by mouth daily as needed. 11/22/21  Yes [name], MD  loratadine (CLARITIN) 10 MG tablet Take 10 mg daily by mouth.    Yes [provider]  losartan (COZAAR) 100 MG tablet TAKE ONE TABLET BY MOUTH EVERY DAY 01/05/22  Yes [name], MD  nitroGLYCERIN (NITROSTAT)  0.4 MG SL tablet Place 1 tablet (0.4 mg total) under the tongue every 5 (five) minutes as needed for chest pain. 07/29/18  Yes [name], MD  sertraline (ZOLOFT) 100 MG tablet TAKE 1 TABLET BY MOUTH DAILY. 04/03/22  Yes [name], MD  Trospium Chloride 60 MG CP24 TAKE ONE CAPSULE EACH MORNING BEFORE BREAKFAST Patient taking differently: Take 1 capsule by mouth daily. At lunch 01/10/22  Yes Dutch Quint B, FNP  vitamin C (ASCORBIC ACID) 500 MG tablet Take 1 tablet (500 mg total) by mouth 2 (two) times daily. 05/04/17  Yes [name], MD  VITAMIN D PO Take 1 capsule by  mouth in the morning.   Yes [provider]  CONTOUR NEXT TEST test strip USE AS DIRECTED TWICE A DAY 05/10/22   [name], MD  COVID-19 Home Collection Test KIT Use as needed for signs and symptoms of COVID INFECTION 11/24/21   [name], MD    Inpatient Medications: Scheduled Meds:  atorvastatin  40 mg Oral Daily   carvedilol  6.25 mg Oral BID WC   [START ON 07/08/2022] clopidogrel  75 mg Oral Daily   furosemide  20 mg Intravenous Q12H   hydrALAZINE  25 mg Oral Q8H   insulin aspart  0-5 Units Subcutaneous QHS   insulin aspart  0-9 Units Subcutaneous TID WC   [START ON 07/08/2022] isosorbide mononitrate  60 mg Oral Daily   [START ON 07/08/2022] loratadine  10 mg Oral Daily   losartan  100 mg Oral Daily   pantoprazole  40 mg Oral Daily   [START ON 07/08/2022] sertraline  100 mg Oral Daily   sodium chloride flush  3 mL Intravenous Q12H   Continuous Infusions:  sodium chloride     heparin 700 Units/hr (07/07/22 1153)   iron sucrose 300 mg (07/07/22 1358)   PRN Meds: sodium chloride, acetaminophen, nitroGLYCERIN, ondansetron (ZOFRAN) IV, sodium chloride flush  Allergies:    Allergies  Allergen Reactions   Clindamycin     dizziness   Oxytetracycline Hives   Phenobarbital Hives   Atropine Other (See Comments)    Reaction: unknown   Belladonna Alkaloids Other (See Comments)    Reaction:  unknown   Codeine Nausea And Vomiting   Darvon [Propoxyphene] Nausea And Vomiting   Demerol [Meperidine] Nausea And Vomiting   Methocarbamol Other (See Comments)    Reaction: unknown   Penicillins Other (See Comments)    Pt and family unsure of details.  Can tolerate cephalosporins   Sulfa Antibiotics Other (See Comments)    Reaction: unknown   Iron Other (See Comments)    High dose prescription gives her diarrhea    Social History:   Social History   Socioeconomic History   Marital status: Widowed    Spouse name: Not on file   Number of children: 4   Years of education: 12   Highest education level: High school graduate  Occupational History   Occupation: retired  Tobacco Use   Smoking status: Never   Smokeless tobacco: Never  Scientific laboratory technician Use: Never used  Substance and Sexual Activity   Alcohol use: No   Drug use: No   Sexual activity: Never  Other Topics Concern   Not on file  Social History Narrative   ** Merged History Encounter **       Married 4 children Never smoker Denies alcohol use Full Code   Social Determinants of Radio broadcast assistant Strain: Not on file  Food Insecurity: Not on file  Transportation Needs: Not on file  Physical Activity: Not on file  Stress: Not on file  Social Connections: Not on file  Intimate Partner Violence: Not on file    Family History:    Family History  Problem Relation Age of Onset   Breast cancer Mother 65   Colon cancer Father    Stroke Sister    Cancer Mother    Breast cancer Sister 56   Colon cancer Brother    Colon cancer Brother      ROS:  Please see the history of present illness.  Review of Systems  Constitutional:  Negative for malaise/fatigue.  Respiratory:  Positive for shortness of breath.   Cardiovascular:  Positive for chest pain.    All other ROS reviewed and negative.     Physical Exam/Data:   Vitals:   07/07/22 1119 07/07/22 1400 07/07/22 1430 07/07/22 1500  BP: (!)  160/63 (!) 125/56 (!) 124/51 133/61  Pulse: 81 76    Resp: (!) 24 (!) 27 (!) 23 19  Temp:      TempSrc:      SpO2: 92% 92%    Weight:      Height:       No intake or output data in the 24 hours ending 07/07/22 1527    07/07/2022    9:31 AM 03/17/2022    9:16 AM 09/23/2021   11:17 AM  Last 3 Weights  Weight (lbs) 140 lb 139 lb 140 lb  Weight (kg) 63.504 kg 63.05 kg 63.504 kg     Body mass index is 26.45 kg/m.  General:  Well nourished, well developed, in no acute distress HEENT: normal Neck: no JVD Vascular: No carotid bruits; Distal pulses 2+ bilaterally Cardiac:  normal S1, S2; III/VI  systolic murmur  Lungs:  clear upper with diminished bases to auscultation bilaterally, no wheezing, rhonchi or rales, respirations are unlabored at rest on room air Abd: soft, nontender, no hepatomegaly  Ext: Trace pretibial edema Musculoskeletal:  No deformities, BUE and BLE strength normal and equal Skin: warm and dry  Neuro:  CNs 2-12 intact, no focal abnormalities noted Psych:  Normal affect   EKG:  The EKG was personally reviewed and demonstrates: Sinus rhythm rate of 82 with rare PACs and artifact Telemetry:  Telemetry was personally reviewed and demonstrates: Sinus rhythm with rates in the 70s and 80s  Relevant CV Studies: TTE 08/19/20 1. Left ventricular ejection fraction, by estimation, is 60 to 65%. The  left ventricle has normal function. The left ventricle has no regional  wall motion abnormalities. There is moderate left ventricular hypertrophy.  Left ventricular diastolic  parameters are consistent with Grade I diastolic dysfunction (impaired  relaxation).   2. Right ventricular systolic function is normal. The right ventricular  size is normal.   3. The mitral valve is normal in structure. Mild to moderate mitral valve  regurgitation.   4. The aortic valve is normal in structure. Aortic valve regurgitation is  not visualized. Mild to moderate aortic valve stenosis. Aortic  valve area,  by VTI measures 1.70 cm. Aortic valve mean gradient measures 16.3 mmHg.  Aortic valve Vmax measures 2.60   m/s.   Laboratory Data:  High Sensitivity Troponin:   Recent Labs  Lab 07/07/22 1035 07/07/22 1120  TROPONINIHS 550* 518*     Chemistry Recent Labs  Lab 07/07/22 1035  NA 129*  K 3.6  CL 100  CO2 19*  GLUCOSE 204*  BUN 40*  CREATININE 1.22*  CALCIUM 8.9  GFRNONAA 40*  ANIONGAP 10    Recent Labs  Lab 07/07/22 1035  PROT 6.5  ALBUMIN 3.3*  AST 18  ALT 15  ALKPHOS 82  BILITOT 0.7   Lipids No results for input(s): "CHOL", "TRIG", "HDL", "LABVLDL", "LDLCALC", "CHOLHDL" in the last 168 hours.  Hematology Recent Labs  Lab 07/07/22 1035  WBC 12.2*  RBC 3.73*  HGB 10.2*  HCT 30.7*  MCV 82.3  MCH 27.3  MCHC 33.2  RDW 13.1  PLT 226   Thyroid No results for input(s): "TSH", "FREET4" in the last 168 hours.  BNP Recent Labs  Lab 07/07/22 1035  BNP 300.1*    DDimer No results for input(s): "DDIMER" in the last 168 hours.   Radiology/Studies:  DG Chest 2 View  Result Date: 07/07/2022 CLINICAL DATA:  Chest pain EXAM: CHEST - 2 VIEW COMPARISON:  CXR 08/26/20 FINDINGS: No pleural effusion. No pneumothorax. There are hazy bibasilar airspace opacities, which could represent atelectasis or infection. Unchanged cardiac and mediastinal contours. No radiographically apparent displaced rib fractures. Visualized upper abdomen unremarkable. Vertebral body heights are maintained. Aortic atherosclerotic calcifications. IMPRESSION: Hazy bibasilar airspace opacities, which could represent atelectasis or infection. No pleural effusion. Electronically Signed   By: Marin Roberts M.D.   On: 07/07/2022 10:23     Assessment and Plan:   NSTEMI with a history of nonobstructive coronary artery disease -Presented with an episode of chest discomfort relieved by 1 nitroglycerin at home -Chest pain-free at time of consultation -High-sensitivity troponins trended and 550  and 518, less than previous readings -No ischemic changes noted on EKG or on telemetry -Continued on statin, clopidogrel, and Imdur -Continued on heparin infusion -Continue with telemetry monitoring -EKG as needed for pain or changes -Echocardiogram ordered and pending  Shortness of breath with a history of diastolic congestive heart failure -Patient has been having progressive shortness of breath over the last several weeks -Blood work by her PCP revealed iron deficiency anemia and she was started on iron supplementations -hgb 10.2 -daily cbc -Last LVEF 60-65% -Maintaining oxygen saturations on room air -Respiratory panel is negative -Continued on furosemide 20 mg IV every 12 hours -BNP 300.1, slight elevation -Daily weights, I's and O's, low-sodium diet -Echocardiogram ordered and pending  Moderate aortic stenosis -Patient with shortness of breath -last echo revealed The aortic valve is normal in structure. Aortic valve regurgitation is not visualized. Mild to moderate aortic valve stenosis. Aortic valve area, by VTI measures 1.70 cm. Aortic valve mean gradient measures 16.3 mmHg. Aortic valve Vmax measures 2.60  m/s.  -Echocardiogram ordered and pending further recommendations to follow  Essential hypertension -Blood pressure 124/51 -Continued on carvedilol, hydralazine, losartan -Vital signs per unit protocol  Hyperlipidemia -LDL 72 -Continue atorvastatin 40 mg daily  6.   Hyponatremia -serum sodium 129 -daily bmp -limit free water -  Risk Assessment/Risk Scores:     TIMI Risk Score for Unstable Angina or Non-ST Elevation MI:   The patient's TIMI risk score is 5, which indicates a 26% risk of all cause mortality, new or recurrent myocardial infarction or need for urgent revascularization in the next 14 days.  New York Heart Association (NYHA) Functional Class NYHA Class II        For questions or updates, please contact [place] Please consult  [email or web address] for contact info under    Signed, Temitope Griffing, NP  07/07/2022 3:27 PM

## 2022-07-07 NOTE — H&P (Addendum)
History and Physical    Maureen Morales [id number] DOB: 1923-04-29 DOA: 07/07/2022  PCP: [name], MD  Patient coming from: Home  I have personally briefly reviewed patient's old medical records in [place]  Chief Complaint: SOB  HPI: Maureen Morales is a 89 y.o. female with medical history significant of chronic diastolic dysfunction, stable coronary disease managed medically, diabetes mellitus, aortic stenosis, refractory hypertension who presents to the ED with chief complaint of progressive weakness and shortness of breath over 1 month.  Per the daughter at bedside fatigue and decreased exercise tolerance have worsened over the past 2 to 3 weeks.  This is initially attributed to iron deficiency anemia.  Patient saw her primary care physician and was started on oral iron supplementation.  On 1 AM the day of presentation she started to experience pain in the center of her chest associated with worsening shortness of breath.  Was given 1 sublingual nitroglycerin tablet with significant resolution in symptoms.  Home COVID test performed and was negative.  Patient has a history of CAD and was hospitalized in April 2022 with NSTEMI in setting of hypertensive urgency.  Underwent cardiac catheterization at that time.  No stents were placed however patient was managed medically with antiplatelet agents.  On presentation to the ED the patient does appear weak overall hemodynamically stable however.  Imaging survey significant for chest x-ray with hazy bilateral basilar airspace opacities.  High-sensitivity troponin elevated at 550, BNP minimally elevated at 300.  ED Course: Given concern for NSTEMI the patient was started on heparin gtt.  Remainder of laboratory and imaging investigation as above.  Hospitalist contacted for admission.  Review of Systems: As per HPI otherwise 14 point review of systems negative.    Past Medical History:  Diagnosis Date   Aortic stenosis    a. 08/2016  Echo: Hyperdynamic LV fxn, mild AS (mean grad 91mHg), mild MR, mild PAH; b. 05/2017 Echo: Hyperdynamic LV fxn, mod AS (mean grad 292mg, valve area 1.84). PASP 10079m.   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 08/2017 Renal U/S: No RAS.   Low back pain with left-sided sciatica 04/29/2018   [facility]   Non-obstructive CAD (coronary artery disease)    a. 08/2016 NSTEMI/Cath: mild to mod nonobs dzs including 60% mRCA stenosis-->Med rx.   NSTEMI (non-ST elevated myocardial infarction) ([place]/06/2016   [place]STEMI (non-ST elevated myocardial infarction) ([facility]/20/2022   Nummular eczema 01/16/2011   Occurring on chest and under breast.  Treated by Dr DasEvorn Gongth fluocinonide 0.05% topical 06/26/12    Peripheral vascular disease ([facility]  Periprosthetic fracture around internal prosthetic hip joint, initial encounter    PONV (postoperative nausea and vomiting)    Positive H. pylori test    Pre-diabetes    Suspected COVID-19 virus infection 01/10/2019   Patient reporting malaise and headache for the past 3 days,  Similar to presentation when she was  positive for  COVID 19 INFECTION  July 2 .  Previous inection was caused by exposure to COVID positive caregiver.  Repeat scenario:  Same caregiver's mother tested positive on Monday and caregiver saw current patient on Tuesday     Viral gastroenteritis due to [place]-like agent Nov 2012    Past Surgical History:  Procedure Laterality Date   ABDOMINAL HYSTERECTOMY     BLADDER SURGERY      CARDIAC CATHETERIZATION     CATARACT EXTRACTION W/PHACO Right 09/29/2020   Procedure: CATARACT EXTRACTION PHACO AND INTRAOCULAR LENS PLACEMENT ([place]) RIGHT VISION BLUE 7.87 00:58.7 ;  Surgeon: Leandrew Koyanagi, MD;  Location: [place];  Service: Ophthalmology;  Laterality: Right;   CATARACT EXTRACTION W/PHACO Left 10/13/2020   Procedure: CATARACT EXTRACTION PHACO AND INTRAOCULAR LENS PLACEMENT ([place]) LEFT VISION BLUE;  Surgeon: Leandrew Koyanagi, MD;  Location: [place];  Service: Ophthalmology;  Laterality: Left;  4.94 1:03.0 7.9%   CHOLECYSTECTOMY     COLONOSCOPY     06/16/1987, 02/20/1995, 05/04/1999, 02/14/2005, 05/17/2007   ESOPHAGOGASTRODUODENOSCOPY     05/11/1987, 06/13/1995, 10/09/1995, 05/04/1999, 01/16/2002   FLEXIBLE SIGMOIDOSCOPY N/A 05/02/2017   Procedure: Otho Darner SIGMOIDOSCOPY;  Surgeon: Manya Silvas, MD;  Location: [facility] ENDOSCOPY;  Service: Endoscopy;  Laterality: N/A;   HEMORRHOID SURGERY N/A 05/14/2017   Procedure: EXTERNAL THROMBOSED HEMORRHOIDECTOMY;  Surgeon: Robert Bellow, MD;  Location: [facility] ORS;  Service: General;  Laterality: N/A;   HIP ARTHROPLASTY Right 02/17/2017   Procedure: ARTHROPLASTY BIPOLAR HIP (HEMIARTHROPLASTY);  Surgeon: Claud Kelp, MD;  Location: [facility] ORS;  Service: Orthopedics;  Laterality: Right;   LEFT HEART CATH AND CORONARY ANGIOGRAPHY N/A 08/31/2016   Procedure: Left Heart Cath and Coronary Angiography;  Surgeon: Wellington Hampshire, MD;  Location: [place] CV LAB;  Service: Cardiovascular;  Laterality: N/A;   ORIF PERIPROSTHETIC FRACTURE Right 05/27/2017   ORIF PERIPROSTHETIC FRACTURE Right 05/27/2017   Procedure: OPEN REDUCTION INTERNAL FIXATION (ORIF) PERIPROSTHETIC FRACTURE;  Surgeon: Mcarthur Rossetti, MD;  Location: [place];  Service: Orthopedics;  Laterality: Right;   TOTAL HIP ARTHROPLASTY Right    uterian prolapse       reports that she has never smoked. She has never used smokeless tobacco. She  reports that she does not drink alcohol and does not use drugs.  Allergies  Allergen Reactions   Clindamycin     dizziness   Oxytetracycline Hives   Phenobarbital Hives   Atropine Other (See Comments)    Reaction: unknown   Belladonna Alkaloids Other (See Comments)    Reaction: unknown   Codeine Nausea And Vomiting   Darvon [Propoxyphene] Nausea And Vomiting   Demerol [Meperidine] Nausea And Vomiting   Methocarbamol Other (See Comments)    Reaction: unknown   Penicillins Other (See Comments)    Pt and family unsure of details.  Can tolerate cephalosporins   Sulfa Antibiotics Other (See Comments)    Reaction: unknown   Iron Other (See Comments)    High dose prescription gives her diarrhea    Family History  Problem Relation Age of Onset   Breast cancer Mother 57   Colon cancer Father    Stroke Sister    Cancer Mother    Breast cancer Sister 51   Colon cancer Brother    Colon cancer Brother     Prior to Admission medications   Medication Sig Start Date End Date Taking? Authorizing Provider  acetaminophen (TYLENOL) 500 MG tablet Take 1,000 mg by mouth every 6 (six) hours as  needed for moderate pain or headache.    [provider]  albuterol (PROVENTIL) (2.5 MG/3ML) 0.083% nebulizer solution Take 3 mLs (2.5 mg total) by nebulization every 6 (six) hours as needed for wheezing or shortness of breath. 11/24/21   [name], MD  albuterol (VENTOLIN HFA) 108 (90 Base) MCG/ACT inhaler Inhale 2 puffs into the lungs every 6 (six) hours as needed for wheezing or shortness of breath. 10/26/21   [name], MD  atorvastatin (LIPITOR) 40 MG tablet TAKE 1 TABLET BY MOUTH DAILY 03/10/22   [name], MD  Carboxymethylcellul-Glycerin (LUBRICATING EYE DROPS OP) Place 1 drop into both eyes daily as needed (dry eyes).    [provider]  carvedilol (COREG) 6.25 MG tablet Take 1 tablet (6.25 mg total) by mouth 2 (two) times daily with a meal. 03/17/22   Wellington Hampshire, MD  clopidogrel (PLAVIX) 75 MG tablet TAKE ONE TABLET BY MOUTH EVERY DAY 04/03/22   [name], MD  CONTOUR NEXT TEST test strip USE AS DIRECTED TWICE A DAY 05/10/22   [name], MD  COVID-19 Home Collection Test KIT Use as needed for signs and symptoms of COVID INFECTION 11/24/21   [name], MD  famotidine (PEPCID) 10 MG tablet Take 10 mg by mouth daily as needed for heartburn or indigestion.    [provider]  glipiZIDE (GLUCOTROL) 5 MG tablet TAKE ONE TABLET TWICE A DAY BEFORE MEALS 01/25/22   [name], MD  hydrALAZINE (APRESOLINE) 25 MG tablet TAKE THREE TABLETS BY MOUTH WITH BREAKFAST  TAKE THREE TABLETS WITH LUNCHAND TAKE TWO TABLETS WITH DINNER AS DIRECTED 04/19/22   [name], MD  hydrochlorothiazide (MICROZIDE) 12.5 MG capsule TAKE 1 CAPSULE BY MOUTH ONCE DAILY 05/10/22   [name], MD  Iron, Ferrous Sulfate, 325 (65 Fe) MG TABS Take 325 mg by mouth daily. 07/04/22   [name], MD  isosorbide mononitrate (IMDUR) 30 MG 24 hr tablet TAKE ONE TABLET BY MOUTH EVERY MORNING AND TAKE TWO TABLETS EVERY EVENING 03/17/22   Wellington Hampshire, MD  Lactobacillus (ULTIMATE PROBIOTIC FORMULA) CAPS Take 1 capsule by mouth daily with supper.    [provider]  lansoprazole (PREVACID) 30 MG capsule TAKE 1 CAPSULE EVERY DAY BEFORE SUPPER 11/22/21   [name], MD  loratadine (CLARITIN) 10 MG tablet Take 10 mg daily by mouth.     [provider]  losartan (COZAAR) 100 MG tablet TAKE ONE TABLET BY MOUTH EVERY DAY 01/05/22   [name], MD  nitroGLYCERIN (NITROSTAT) 0.4 MG SL tablet Place 1 tablet (0.4 mg total) under the tongue every 5 (five) minutes as needed for chest pain. 07/29/18   [name], MD  sertraline (ZOLOFT) 100 MG tablet TAKE 1 TABLET BY MOUTH DAILY. 04/03/22   [name], MD  Trospium Chloride 60 MG CP24 TAKE ONE CAPSULE EACH MORNING BEFORE BREAKFAST 01/10/22   Dutch Quint B, FNP  vitamin C (ASCORBIC  ACID) 500 MG tablet Take 1 tablet (500 mg total) by mouth 2 (two) times daily. 05/04/17   [name], MD  VITAMIN D PO Take by mouth daily at 2 am.    [provider]    Physical Exam: Vitals:   07/07/22 0931 07/07/22 0938 07/07/22 1119  BP:  118/68 (!) 160/63  Pulse:  75 81  Resp:  16 (!) 24  Temp:  98.2 F (36.8 C)   TempSrc:  Oral   SpO2:  95% 92%  Weight: 63.5 kg    Height: '5\' 1"'$  (1.549 m)      Vitals:   07/07/22 0931 07/07/22 0938 07/07/22 1119  BP:  118/68 (!) 160/63  Pulse:  75 81  Resp:  16 (!) 24  Temp:  98.2 F (36.8 C)   TempSrc:  Oral   SpO2:  95% 92%  Weight: 63.5 kg    Height: '5\' 1"'$  (1.549 m)     General: Appears fatigued.  Appears stated age 29: Normocephalic, atraumatic Neck, supple, trachea midline, no tenderness Heart: Regular rate and rhythm, S1/S2 normal, no murmurs Lungs: Scattered crackles bilaterally.  Normal work of breathing.  Room air Abdomen: Thin, soft, NT/ND, normal bowel sounds Extremities: Normal, atraumatic, no clubbing or cyanosis, normal muscle tone Skin: No rashes or lesions, normal color Neurologic: Cranial nerves grossly intact, sensation intact, alert and oriented x3 Psychiatric: Normal affect   Labs on Admission: I have personally reviewed following labs and imaging studies  CBC: Recent Labs  Lab 07/07/22 1035  WBC 12.2*  HGB 10.2*  HCT 30.7*  MCV 82.3  PLT [id number]   Basic Metabolic Panel: Recent Labs  Lab 07/07/22 1035  NA 129*  K 3.6  CL 100  CO2 19*  GLUCOSE 204*  BUN 40*  CREATININE 1.22*  CALCIUM 8.9   GFR: Estimated Creatinine Clearance: 22.5 mL/min (A) (by C-G formula based on SCr of 1.22 mg/dL (H)). Liver Function Tests: Recent Labs  Lab 07/07/22 1035  AST 18  ALT 15  ALKPHOS 82  BILITOT 0.7  PROT 6.5  ALBUMIN 3.3*   No results for input(s): "LIPASE", "AMYLASE" in the last 168 hours. No results for input(s): "AMMONIA" in the last 168 hours. Coagulation Profile: No results for  input(s): "INR", "PROTIME" in the last 168 hours. Cardiac Enzymes: No results for input(s): "CKTOTAL", "CKMB", "CKMBINDEX", "TROPONINI" in the last 168 hours. BNP (last 3 results) Recent Labs    01/04/22 1042 06/30/22 1044  PROBNP 218.0* 228.0*   HbA1C: No results for input(s): "HGBA1C" in the last 72 hours. CBG: No results for input(s): "GLUCAP" in the last 168 hours. Lipid Profile: No results for input(s): "CHOL", "HDL", "LDLCALC", "TRIG", "CHOLHDL", "LDLDIRECT" in the last 72 hours. Thyroid Function Tests: No results for input(s): "TSH", "T4TOTAL", "FREET4", "T3FREE", "THYROIDAB" in the last 72 hours. Anemia Panel: No results for input(s): "VITAMINB12", "FOLATE", "FERRITIN", "TIBC", "IRON", "RETICCTPCT" in the last 72 hours. Urine analysis:    Component Value Date/Time   COLORURINE YELLOW 06/27/2022 1133   APPEARANCEUR CLEAR 06/27/2022 1133   LABSPEC 1.010 06/27/2022 1133   PHURINE 5.5 06/27/2022 1133   GLUCOSEU NEGATIVE 06/27/2022 1133   HGBUR NEGATIVE 06/27/2022 1133   BILIRUBINUR NEGATIVE 06/27/2022 1133   BILIRUBINUR neg 04/01/2021 1103   KETONESUR NEGATIVE 06/27/2022 1133   PROTEINUR Positive (A) 04/01/2021 1103   PROTEINUR NEGATIVE 08/26/2020 1800   UROBILINOGEN 0.2 06/27/2022 1133   NITRITE NEGATIVE 06/27/2022 1133   LEUKOCYTESUR NEGATIVE 06/27/2022 1133    Radiological Exams on Admission: DG Chest 2 View  Result Date: 07/07/2022 CLINICAL DATA:  Chest pain EXAM: CHEST - 2 VIEW COMPARISON:  CXR 08/26/20 FINDINGS: No pleural effusion. No pneumothorax. There are hazy bibasilar airspace opacities, which could represent atelectasis or infection. Unchanged cardiac and mediastinal contours. No radiographically apparent displaced rib fractures. Visualized upper abdomen unremarkable. Vertebral body heights are maintained. Aortic atherosclerotic calcifications. IMPRESSION: Hazy bibasilar airspace opacities, which could represent atelectasis or infection. No pleural effusion.  Electronically  Signed   By: Marin Roberts M.D.   On: 07/07/2022 10:23    EKG: Independently reviewed.  Sinus rhythm  Assessment/Plan Active Problems:   NSTEMI (non-ST elevated myocardial infarction) (HCC)  NSTEMI History of CAD Stable angina Chest pain Initial sensitivity troponin 550.  Unclear whether this represents true ACS versus supply/demand ischemia.  Considering history of CAD will treat as NSTEMI. Plan: Heparin GTT x 48 hours Continue home Plavix Continue beta-blocker and ACE inhibitor Continue long-acting nitrate SL nitroglycerin as needed chest pain Trend troponin to peak Stat EKG as needed chest pain Telemetry monitoring Cardiology consult  Hyperlipidemia PTA statin  Suspected acute on chronic diastolic congestive heart failure Patient does have some signs of fluid overload including some hazy opacities on the chest felt to be due to interstitial edema.  Interestingly BNP only slightly elevated Plan: Continue 20 mg Lasix IV twice daily Beta-blocker and ACE inhibitor as above Strict ins and outs, daily weights Reassess tomorrow for diuretic needed tolerance  Refractory hypertension Patient is on aggressive dosing regimen of several agents.  Will continue while hospitalized.  As needed hydralazine for SBP greater than 165  Iron deficiency anemia Patient was presenting diagnosed with IDA and placed on oral iron supplementation.  We will repeat iron panel here and give 1 dose of IV Venofer 300 mg followed by resumption of home iron supplement 3/9  Weakness Shortness of breath Suspected secondary to above acute medical issues.  Treatment as above and reassess  AKI on chronic kidney disease stage IIIa Creatinine slightly above baseline.  Possible due to cardiorenal syndrome.  Diuresis as above and reevaluate.  Type 2 diabetes mellitus with hyperglycemia Patient on oral agents at home.  Will hold.  Sensitive sliding scale, carb modified diet, Accu-Cheks before  meals and at bedtime  Hyponatremia Felt to be secondary to fluid overload noted state/hypervolemic hyponatremia.  Diuresis as above.  Reassess  Depression PTA Zoloft  Goals of care Discussed CODE STATUS at length with patient and family are at bedside.  Patient confirms she is a DNR.  She was DNR on previous admissions.     DVT prophylaxis: Heparin GTT Code Status: DNR Family Communication: Family member and caregiver at bedside Disposition Plan: Anticipate return to previous home environment Consults called: Cardiology-[facility] Admission status: Observation, cardiac telemetry   [name] MD [facility]   If 7PM-7AM, please contact night-coverage   07/07/2022, 12:00 PM

## 2022-07-07 NOTE — ED Notes (Signed)
Complete bed change. Purewick replaced and new brief on. Pt pulled up in bed. Warm blankets provided. Daughter and caregiver at bedside.

## 2022-07-07 NOTE — ED Notes (Signed)
Pt up to commode to provide urine sample.

## 2022-07-07 NOTE — Progress Notes (Signed)
*  PRELIMINARY RESULTS* Echocardiogram 2D Echocardiogram has been performed.  Maureen Morales 07/07/2022, 3:02 PM

## 2022-07-07 NOTE — ED Provider Notes (Signed)
[facility] Provider Note    Event Date/Time   First MD Initiated Contact with Patient 07/07/22 [phone]     (approximate)  History   Chief Complaint: Chest Pain and Shortness of Breath  HPI  Maureen Morales is a 89 y.o. female with a past medical history of diabetes, gastric reflux, hypertension, hyperlipidemia, CAD, presents to the emergency department for chest pain.  According to the patient for the past 2 to 3 weeks she has been feeling very fatigued and weak in general.  Saw her doctor 2 weeks ago had lab work showing some anemia and was started on iron supplements.  Patient states she has continued to feel somewhat fatigued and then this morning around 1 AM developed pain in her chest along with some shortness of breath.  Took 1 nitroglycerin tablet and states significant resolution of symptoms states very slight tightness sensation currently in the right chest.  No pleuritic pain.  No leg pain or swelling.  No fever cough congestion vomiting or diarrhea.  Physical Exam   Triage Vital Signs: ED Triage Vitals  Enc Vitals Group     BP 07/07/22 0938 118/68     Pulse Rate 07/07/22 0938 75     Resp 07/07/22 0938 16     Temp 07/07/22 0938 98.2 F (36.8 C)     Temp Source 07/07/22 0938 Oral     SpO2 07/07/22 0938 95 %     Weight 07/07/22 0931 140 lb (63.5 kg)     Height 07/07/22 0931 '5\' 1"'$  (1.549 m)     Head Circumference --      Peak Flow --      Pain Score 07/07/22 0930 5     Pain Loc --      Pain Edu? --      Excl. in [place]? --     Most recent vital signs: Vitals:   07/07/22 0938  BP: 118/68  Pulse: 75  Resp: 16  Temp: 98.2 F (36.8 C)  SpO2: 95%    General: Awake, no distress.  CV:  Good peripheral perfusion.  Regular rate and rhythm  Resp:  Normal effort.  Equal breath sounds bilaterally.  Abd:  No distention.  Soft, nontender.  No rebound or guarding.  ED Results / Procedures / Treatments   EKG  EKG viewed and interpreted by myself shows  a normal sinus rhythm at 82 bpm with a narrow QRS, normal axis, normal intervals, no concerning ST changes.  RADIOLOGY  I have reviewed and interpreted the x-ray images.  Possible slight consolidation in right upper and lower lung. Radiology has read hazy bibasilar airspace opacities could represent atelectasis or infection.   MEDICATIONS ORDERED IN ED: Medications - No data to display   IMPRESSION / MDM / [place] / ED COURSE  I reviewed the triage vital signs and the nursing notes.  Patient's presentation is most consistent with acute presentation with potential threat to life or bodily function.  Patient presents emergency department for generalized weakness for the past several weeks as well as chest pain last night.  Overall the patient appears well, no distress.  Vital signs are reassuring.  Overall reassuring physical exam.  Given the patient's generalized weakness fatigue as well as shortness of breath this morning we will obtain a COVID/flu/RSV swab.  Will obtain a chest x-ray to help rule out pneumonia or other infectious etiology or pulmonary edema.  Will obtain labs including cardiac enzymes to evaluate for  ACS, anemia metabolic or electrolyte abnormality.  Will also obtain a urinalysis to rule out urinary tract infection.  Patient and family are agreeable to this plan of care.  Patient's labs have resulted showing significant troponin elevation to 550.  Given this finding and the chest x-ray appearance I suspect more interstitial pulmonary edema on chest x-ray.  I have added on a BNP.  We will cover with IV heparin and admit to the hospitalist service for further workup and treatment.  CBC shows slight leukocytosis otherwise reassuring, chemistry shows mild hyponatremia otherwise no concerning finding.  COVID flu and RSV are negative.  I discussed these findings with the patient and family and they are agreeable to admission and further workup and treatment.  CRITICAL  CARE Performed by: Harvest Dark   Total critical care time: 30 minutes  Critical care time was exclusive of separately billable procedures and treating other patients.  Critical care was necessary to treat or prevent imminent or life-threatening deterioration.  Critical care was time spent personally by me on the following activities: development of treatment plan with patient and/or surrogate as well as nursing, discussions with consultants, evaluation of patient's response to treatment, examination of patient, obtaining history from patient or surrogate, ordering and performing treatments and interventions, ordering and review of laboratory studies, ordering and review of radiographic studies, pulse oximetry and re-evaluation of patient's condition.   FINAL CLINICAL IMPRESSION(S) / ED DIAGNOSES   Weakness Chest pain NSTEMI   Note:  This document was prepared using Dragon voice recognition software and may include unintentional dictation errors.   Harvest Dark, MD 07/07/22 1120

## 2022-07-07 NOTE — Consult Note (Signed)
[place] for heparin drip Indication: chest pain/ACS  Allergies  Allergen Reactions   Clindamycin     dizziness   Oxytetracycline Hives   Phenobarbital Hives   Atropine Other (See Comments)    Reaction: unknown   Belladonna Alkaloids Other (See Comments)    Reaction: unknown   Codeine Nausea And Vomiting   Darvon [Propoxyphene] Nausea And Vomiting   Demerol [Meperidine] Nausea And Vomiting   Methocarbamol Other (See Comments)    Reaction: unknown   Penicillins Other (See Comments)    Pt and family unsure of details.  Can tolerate cephalosporins   Sulfa Antibiotics Other (See Comments)    Reaction: unknown   Iron Other (See Comments)    High dose prescription gives her diarrhea    Patient Measurements: Height: '5\' 1"'$  (154.9 cm) Weight: 63.5 kg (140 lb) IBW/kg (Calculated) : 47.8 Heparin Dosing Weight: 60.9 kg  Vital Signs: Temp: 98.2 F (36.8 C) (03/08 0938) Temp Source: Oral (03/08 0938) BP: 118/68 (03/08 0938) Pulse Rate: 75 (03/08 0938)  Labs: Recent Labs    07/07/22 1035  HGB 10.2*  HCT 30.7*  PLT 226  CREATININE 1.22*  TROPONINIHS 550*    Estimated Creatinine Clearance: 22.5 mL/min (A) (by C-G formula based on SCr of 1.22 mg/dL (H)).   Medical History: Past Medical History:  Diagnosis Date   Aortic stenosis    a. 08/2016 Echo: Hyperdynamic LV fxn, mild AS (mean grad 49mHg), mild MR, mild PAH; b. 05/2017 Echo: Hyperdynamic LV fxn, mod AS (mean grad 224mg, valve area 1.84). PASP 10031m.   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 08/2017 Renal U/S: No RAS.   Low back pain with left-sided sciatica 04/29/2018   [facility]   Non-obstructive CAD (coronary artery disease)    a. 08/2016 NSTEMI/Cath: mild to mod nonobs dzs including 60% mRCA stenosis-->Med rx.   NSTEMI (non-ST elevated myocardial infarction) ([place]CC[place]/06/2016   NSTEMI (non-ST elevated myocardial infarction) ([place]/20/2022   Nummular eczema 01/16/2011   Occurring on chest and under breast.  Treated by Dr DasEvorn Gongth fluocinonide 0.05% topical 06/26/12    Peripheral vascular disease (HCCR[place]  Periprosthetic fracture around internal prosthetic hip joint, initial encounter    PONV (postoperative nausea and vomiting)    Positive H. pylori test    Pre-diabetes    Suspected COVID-19 virus infection 01/10/2019   Patient reporting malaise and headache for the past 3 days,  Similar to presentation when she was positive for  COVID 19 INFECTION  July 2 .  Previous inection was caused by exposure to COVID positive caregiver.  Repeat scenario:  Same caregiver's mother tested positive on Monday and caregiver saw current patient on Tuesday     Viral gastroenteritis due to [place]-like agent Nov 2012    Medications:  No home anticoagulation per pharmacist review  Assessment: [age] female presented to ED with chest pain.  PMH includes HTN, HLD, and CAD.  Patient found to have elevated troponin so pharmacy consulted to initiate heparin infusion.  Baseline labs: hgb 10.2, hct 30.7, plt 226, PT/INR pending, aPTT pending Goal of Therapy:  Heparin level 0.3-0.7 units/ml Monitor platelets by anticoagulation protocol: Yes   Plan:  Give 3500 units bolus x 1 Start heparin infusion at 700 units/hr Check anti-Xa level in 8 hours and daily while on heparin Continue to monitor H&H and platelets  Lorin Picket, PharmD 07/07/2022,11:27 AM

## 2022-07-08 ENCOUNTER — Observation Stay: Payer: Medicare Other

## 2022-07-08 DIAGNOSIS — E1151 Type 2 diabetes mellitus with diabetic peripheral angiopathy without gangrene: Secondary | ICD-10-CM | POA: Diagnosis present

## 2022-07-08 DIAGNOSIS — I080 Rheumatic disorders of both mitral and aortic valves: Secondary | ICD-10-CM | POA: Diagnosis present

## 2022-07-08 DIAGNOSIS — D6489 Other specified anemias: Secondary | ICD-10-CM

## 2022-07-08 DIAGNOSIS — D509 Iron deficiency anemia, unspecified: Secondary | ICD-10-CM | POA: Diagnosis present

## 2022-07-08 DIAGNOSIS — R0602 Shortness of breath: Secondary | ICD-10-CM | POA: Diagnosis not present

## 2022-07-08 DIAGNOSIS — N1831 Chronic kidney disease, stage 3a: Secondary | ICD-10-CM | POA: Diagnosis present

## 2022-07-08 DIAGNOSIS — R778 Other specified abnormalities of plasma proteins: Secondary | ICD-10-CM | POA: Diagnosis not present

## 2022-07-08 DIAGNOSIS — E876 Hypokalemia: Secondary | ICD-10-CM | POA: Diagnosis present

## 2022-07-08 DIAGNOSIS — N179 Acute kidney failure, unspecified: Secondary | ICD-10-CM | POA: Diagnosis present

## 2022-07-08 DIAGNOSIS — I5033 Acute on chronic diastolic (congestive) heart failure: Secondary | ICD-10-CM | POA: Diagnosis present

## 2022-07-08 DIAGNOSIS — I160 Hypertensive urgency: Secondary | ICD-10-CM | POA: Diagnosis present

## 2022-07-08 DIAGNOSIS — F32A Depression, unspecified: Secondary | ICD-10-CM | POA: Diagnosis present

## 2022-07-08 DIAGNOSIS — Z8616 Personal history of COVID-19: Secondary | ICD-10-CM | POA: Diagnosis not present

## 2022-07-08 DIAGNOSIS — J45909 Unspecified asthma, uncomplicated: Secondary | ICD-10-CM | POA: Diagnosis present

## 2022-07-08 DIAGNOSIS — I509 Heart failure, unspecified: Secondary | ICD-10-CM

## 2022-07-08 DIAGNOSIS — Z66 Do not resuscitate: Secondary | ICD-10-CM | POA: Diagnosis present

## 2022-07-08 DIAGNOSIS — J849 Interstitial pulmonary disease, unspecified: Secondary | ICD-10-CM | POA: Diagnosis present

## 2022-07-08 DIAGNOSIS — I252 Old myocardial infarction: Secondary | ICD-10-CM | POA: Diagnosis not present

## 2022-07-08 DIAGNOSIS — I214 Non-ST elevation (NSTEMI) myocardial infarction: Secondary | ICD-10-CM | POA: Diagnosis present

## 2022-07-08 DIAGNOSIS — Z79899 Other long term (current) drug therapy: Secondary | ICD-10-CM | POA: Diagnosis not present

## 2022-07-08 DIAGNOSIS — E785 Hyperlipidemia, unspecified: Secondary | ICD-10-CM | POA: Diagnosis present

## 2022-07-08 DIAGNOSIS — Z881 Allergy status to other antibiotic agents status: Secondary | ICD-10-CM | POA: Diagnosis not present

## 2022-07-08 DIAGNOSIS — E871 Hypo-osmolality and hyponatremia: Secondary | ICD-10-CM | POA: Diagnosis present

## 2022-07-08 DIAGNOSIS — E1165 Type 2 diabetes mellitus with hyperglycemia: Secondary | ICD-10-CM | POA: Diagnosis present

## 2022-07-08 DIAGNOSIS — I350 Nonrheumatic aortic (valve) stenosis: Secondary | ICD-10-CM | POA: Diagnosis not present

## 2022-07-08 DIAGNOSIS — Z1152 Encounter for screening for COVID-19: Secondary | ICD-10-CM | POA: Diagnosis not present

## 2022-07-08 DIAGNOSIS — I25110 Atherosclerotic heart disease of native coronary artery with unstable angina pectoris: Secondary | ICD-10-CM | POA: Diagnosis present

## 2022-07-08 DIAGNOSIS — E1122 Type 2 diabetes mellitus with diabetic chronic kidney disease: Secondary | ICD-10-CM | POA: Diagnosis present

## 2022-07-08 DIAGNOSIS — I130 Hypertensive heart and chronic kidney disease with heart failure and stage 1 through stage 4 chronic kidney disease, or unspecified chronic kidney disease: Secondary | ICD-10-CM | POA: Diagnosis present

## 2022-07-08 LAB — BASIC METABOLIC PANEL
Anion gap: 14 (ref 5–15)
BUN: 37 mg/dL — ABNORMAL HIGH (ref 8–23)
CO2: 20 mmol/L — ABNORMAL LOW (ref 22–32)
Calcium: 9 mg/dL (ref 8.9–10.3)
Chloride: 99 mmol/L (ref 98–111)
Creatinine, Ser: 1.24 mg/dL — ABNORMAL HIGH (ref 0.44–1.00)
GFR, Estimated: 40 mL/min — ABNORMAL LOW (ref >60)
Glucose, Bld: 100 mg/dL — ABNORMAL HIGH (ref 70–99)
Potassium: 3 mmol/L — ABNORMAL LOW (ref 3.5–5.1)
Sodium: 133 mmol/L — ABNORMAL LOW (ref 135–145)

## 2022-07-08 LAB — HEPARIN LEVEL (UNFRACTIONATED)
Heparin Unfractionated: 0.29 IU/mL — ABNORMAL LOW (ref 0.30–0.70)
Heparin Unfractionated: 0.42 IU/mL (ref 0.30–0.70)
Heparin Unfractionated: 0.55 IU/mL (ref 0.30–0.70)

## 2022-07-08 LAB — CBC
HCT: 27.9 % — ABNORMAL LOW (ref 36.0–46.0)
Hemoglobin: 9.4 g/dL — ABNORMAL LOW (ref 12.0–15.0)
MCH: 26.9 pg (ref 26.0–34.0)
MCHC: 33.7 g/dL (ref 30.0–36.0)
MCV: 79.9 fL — ABNORMAL LOW (ref 80.0–100.0)
Platelets: 223 10*3/uL (ref 150–400)
RBC: 3.49 MIL/uL — ABNORMAL LOW (ref 3.87–5.11)
RDW: 13 % (ref 11.5–15.5)
WBC: 11.1 10*3/uL — ABNORMAL HIGH (ref 4.0–10.5)
nRBC: 0 % (ref 0.0–0.2)

## 2022-07-08 LAB — GLUCOSE, CAPILLARY
Glucose-Capillary: 151 mg/dL — ABNORMAL HIGH (ref 70–99)
Glucose-Capillary: 141 mg/dL — ABNORMAL HIGH (ref 70–99)
Glucose-Capillary: 169 mg/dL — ABNORMAL HIGH (ref 70–99)
Glucose-Capillary: 312 mg/dL — ABNORMAL HIGH (ref 70–99)

## 2022-07-08 LAB — PROCALCITONIN: Procalcitonin: <0.1 ng/mL

## 2022-07-08 MED ORDER — FERROUS SULFATE 325 (65 FE) MG PO TABS
325.0000 mg | ORAL_TABLET | Freq: Every day | ORAL | Status: DC
  Administered 2022-07-08 – 2022-07-10 (×3): 325 mg via ORAL
  Filled 2022-07-08 (×3): qty 1

## 2022-07-08 MED ORDER — IOHEXOL 350 MG/ML SOLN
75.0000 mL | Freq: Once | INTRAVENOUS | Status: AC | PRN
  Administered 2022-07-08: 75 mL via INTRAVENOUS

## 2022-07-08 MED ORDER — SODIUM CHLORIDE 0.9 % IV SOLN
500.0000 mg | INTRAVENOUS | Status: DC
  Administered 2022-07-08 – 2022-07-09 (×2): 500 mg via INTRAVENOUS
  Filled 2022-07-08 (×3): qty 5

## 2022-07-08 MED ORDER — POTASSIUM CHLORIDE CRYS ER 20 MEQ PO TBCR
40.0000 meq | EXTENDED_RELEASE_TABLET | ORAL | Status: AC
  Administered 2022-07-08 (×2): 40 meq via ORAL
  Filled 2022-07-08 (×2): qty 2

## 2022-07-08 MED ORDER — ALBUTEROL SULFATE (2.5 MG/3ML) 0.083% IN NEBU
2.5000 mg | INHALATION_SOLUTION | Freq: Three times a day (TID) | RESPIRATORY_TRACT | Status: DC
  Administered 2022-07-09 – 2022-07-10 (×4): 2.5 mg via RESPIRATORY_TRACT
  Filled 2022-07-08 (×4): qty 3

## 2022-07-08 MED ORDER — ALBUTEROL SULFATE (2.5 MG/3ML) 0.083% IN NEBU: 2.5000 mg | INHALATION_SOLUTION | RESPIRATORY_TRACT | Status: DC | PRN

## 2022-07-08 MED ORDER — ALBUTEROL SULFATE (2.5 MG/3ML) 0.083% IN NEBU
2.5000 mg | INHALATION_SOLUTION | RESPIRATORY_TRACT | Status: DC
  Administered 2022-07-08 (×2): 2.5 mg via RESPIRATORY_TRACT
  Filled 2022-07-08 (×2): qty 3

## 2022-07-08 MED ORDER — METHYLPREDNISOLONE SODIUM SUCC 40 MG IJ SOLR
20.0000 mg | Freq: Two times a day (BID) | INTRAMUSCULAR | Status: DC
  Administered 2022-07-08 – 2022-07-10 (×4): 20 mg via INTRAVENOUS
  Filled 2022-07-08 (×4): qty 1

## 2022-07-08 MED ORDER — BUDESONIDE 0.5 MG/2ML IN SUSP
0.5000 mg | Freq: Two times a day (BID) | RESPIRATORY_TRACT | Status: DC
  Administered 2022-07-08 – 2022-07-10 (×5): 0.5 mg via RESPIRATORY_TRACT
  Filled 2022-07-08 (×5): qty 2

## 2022-07-08 MED ORDER — FUROSEMIDE 10 MG/ML IJ SOLN: 20.0000 mg | Freq: Every day | INTRAMUSCULAR | Status: DC

## 2022-07-08 NOTE — Consult Note (Signed)
[place] for heparin drip Indication: chest pain/ACS  Allergies  Allergen Reactions   Clindamycin     dizziness   Oxytetracycline Hives   Phenobarbital Hives   Atropine Other (See Comments)    Reaction: unknown   Belladonna Alkaloids Other (See Comments)    Reaction: unknown   Codeine Nausea And Vomiting   Darvon [Propoxyphene] Nausea And Vomiting   Demerol [Meperidine] Nausea And Vomiting   Methocarbamol Other (See Comments)    Reaction: unknown   Penicillins Other (See Comments)    Pt and family unsure of details.  Can tolerate cephalosporins   Sulfa Antibiotics Other (See Comments)    Reaction: unknown   Iron Other (See Comments)    High dose prescription gives her diarrhea    Patient Measurements: Height: '5\' 1"'$  (154.9 cm) Weight: 59.4 kg (130 lb 15.3 oz) IBW/kg (Calculated) : 47.8 Heparin Dosing Weight: 60.9 kg  Vital Signs: Temp: 98.2 F (36.8 C) (03/09 1257) Temp Source: Oral (03/09 1257) BP: 121/43 (03/09 1257) Pulse Rate: 78 (03/09 1345)  Labs: Recent Labs    07/07/22 1035 07/07/22 1120 07/07/22 1135 07/07/22 1945 07/08/22 0358 07/08/22 1356  HGB 10.2*  --   --   --  9.4*  --   HCT 30.7*  --   --   --  27.9*  --   PLT 226  --   --   --  223  --   APTT  --   --  38*  --   --   --   LABPROT  --   --  15.2  --   --   --   INR  --   --  1.2  --   --   --   HEPARINUNFRC  --   --   --  0.35 0.29* 0.42  CREATININE 1.22*  --   --   --  1.24*  --   TROPONINIHS 550* 518*  --   --   --   --      Estimated Creatinine Clearance: 21.5 mL/min (A) (by C-G formula based on SCr of 1.24 mg/dL (H)).   Medical History: Past Medical History:  Diagnosis Date   Aortic stenosis    a. 08/2016 Echo: Hyperdynamic LV fxn, mild AS (mean grad 67mHg), mild MR, mild PAH; b. 05/2017 Echo: Hyperdynamic LV fxn, mod AS (mean grad 256mg, valve area 1.84). PASP 10058m.   Asthma without status asthmaticus    unspecified   Closed right hip  fracture ([place]CC[place]/26/2019   Colitis    unspecified   Complication of anesthesia    sensitive to anesthesia   Depression    Diabetes mellitus type 2, uncomplicated (HCC)    Essential hypertension    Family history of adverse reaction to anesthesia    "Whole family" - PO Nausea   GERD (gastroesophageal reflux disease)    Hearing loss in left ear    sensory neural   Hyperlipidemia    unspecified   Hypertension    a. 08/2017 Renal U/S: No RAS.   Low back pain with left-sided sciatica 04/29/2018   [facility]   Non-obstructive CAD (coronary artery disease)    a. 08/2016 NSTEMI/Cath: mild to mod nonobs dzs including 60% mRCA stenosis-->Med rx.   NSTEMI (non-ST elevated myocardial infarction) ([place]CC[place]/06/2016   NSTEMI (non-ST elevated myocardial infarction) ([place]/20/2022   Nummular eczema 01/16/2011   Occurring on chest and under breast.  Treated by  Dr Evorn Gong with fluocinonide 0.05% topical 06/26/12    Peripheral vascular disease ([place])    Periprosthetic fracture around internal prosthetic hip joint, initial encounter    PONV (postoperative nausea and vomiting)    Positive H. pylori test    Pre-diabetes    Suspected COVID-19 virus infection 01/10/2019   Patient reporting malaise and headache for the past 3 days,  Similar to presentation when she was positive for  COVID 19 INFECTION  July 2 .  Previous inection was caused by exposure to COVID positive caregiver.  Repeat scenario:  Same caregiver's mother tested positive on Monday and caregiver saw current patient on Tuesday     Viral gastroenteritis due to [place]-like agent Nov 2012    Medications:  No home anticoagulation per pharmacist review  Assessment: 89 yo female presented to ED with chest pain.  PMH includes HTN, HLD, and CAD.  Patient found to have elevated troponin so pharmacy consulted to initiate heparin infusion.  Date Time HL Rate/Comment 3/8 1945 0.35 Therapeutic x1 / 700  u/hr 3/9 0358 0.29 Subtherapeutic 3/9 1356 0.42 Therap x 1  Goal of Therapy:  Heparin level 0.3-0.7 units/ml Monitor platelets by anticoagulation protocol: Yes   Plan:  Therapeutic x1 Continue heparin infusion at 850 units/hr Check confirmatory HL in 8 hr after  Continue to monitor H&H and platelets daily while on heparin gtt.  Chinita Greenland PharmD Clinical Pharmacist 07/08/2022

## 2022-07-08 NOTE — Consult Note (Signed)
NAME:  Maureen Morales, MRN:  [id number], DOB:  1923-03-10, LOS: 0 ADMISSION DATE:  07/07/2022  CHIEF COMPLAINT:  SOB and weakness   History of Present Illness:  89 y.o. female with medical history significant of chronic diastolic dysfunction, stable coronary disease managed medically, diabetes mellitus, aortic stenosis, refractory hypertension who presents to the ED with chief complaint of progressive weakness and shortness of breath over 1 month.    Per the daughter at bedside fatigue and decreased exercise tolerance have worsened over the past 2 to 3 weeks.    This is initially attributed to iron deficiency anemia.  Patient saw her primary care physician and was started on oral iron supplementation.  On 1 AM the day of presentation she started to experience pain in the center of her chest associated with worsening shortness of breath.  Was given 1 sublingual nitroglycerin tablet with significant resolution in symptoms.  Home COVID test performed and was negative. Patient was seen by Cardiology and started heparin and lasix therapy   Patient has a history of CAD and was hospitalized in April 2022 with NSTEMI in setting of hypertensive urgency.  Underwent cardiac catheterization at that time.  No stents were placed however patient was managed medically with antiplatelet agents.   On presentation to the ED the patient does appear weak overall hemodynamically stable however.  Imaging survey significant for chest x-ray with hazy bilateral basilar airspace opacities.  High-sensitivity troponin elevated at 550, BNP minimally elevated at 300.   ED Course: Given concern for NSTEMI the patient was started on heparin gtt.  Remainder of laboratory and imaging investigation as above.  Hospitalist contacted for admission.      Significant Hospital Events: Including procedures, antibiotic start and stop dates in addition to other pertinent events   3/9 PCCM consulted for Abnormal CT scans-started steroids  and IV abx     Micro Data:  COVID FLU/RSV NEG VIRAL PANEL PENDING   Antimicrobials:   Antibiotics Given (last 72 hours)     None             Objective   Blood pressure (!) 106/49, pulse 80, temperature 98.3 F (36.8 C), resp. rate 18, height '5\' 1"'$  (1.549 m), weight 59.4 kg, SpO2 97 %.        Intake/Output Summary (Last 24 hours) at 07/08/2022 1257 Last data filed at 07/08/2022 [id number] Gross per 24 hour  Intake 680.32 ml  Output 700 ml  Net -19.68 ml   Filed Weights   07/07/22 0931 07/08/22 0500  Weight: 63.5 kg 59.4 kg        Review of Systems: Gen: fatigue weakness, loss of appetitive HEENT: Denies blurred vision, double vision, ear pain, eye pain, hearing loss, nose bleeds, sore throat Cardiac:  No dizziness, chest pain or heaviness, + chest tightness, Resp:   No cough, -sputum production, +shortness of breath,+wheezing, -hemoptysis,  Other:  All other systems negative   Physical Examination:   General Appearance: No distress  EYES PERRLA, EOM intact.   NECK Supple, No JVD Pulmonary: normal breath sounds,+ RHONCHI +crackles CardiovascularNormal [id number].  +SYSTOLIC MURMUR Abdomen: Benign, Soft, non-tender. Neurology UE/LE 5/5 strength, no focal deficits Ext pulses intact, cap refill intact ALL OTHER ROS ARE NEGATIVE CT chest 3/9  b/l Interstitial GGO   Labs/imaging that I havepersonally reviewed  (right click and "Reselect all SmartList Selections" daily)       ASSESSMENT AND PLAN SYNOPSIS  89 yo pleasant white female admitted for SOB and fatigue  due to acute damage likely from atypical infection and inflammation   GGO's with acute interstitial lung disease Start IV steroids Starts IV abx-azithro BD therapy Oxygen as needed Check STREP and LEGIONELLA AG, RVP pending  MOD/SEVERE AS Follow up cardiology recs Lasix as tolerated   CARDIAC ICU monitoring    Intake/Output Summary (Last 24 hours) at 07/08/2022 1257 Last data filed at  07/08/2022 [id number] Gross per 24 hour  Intake 680.32 ml  Output 700 ml  Net -19.68 ml   INFECTIOUS DISEASE -continue antibiotics as prescribed -follow up cultures   ENDO - ICU hypoglycemic\Hyperglycemia protocol -check FSBS per protocol   GI GI PROPHYLAXIS as indicated  NUTRITIONAL STATUS DIET--> as tolerated Constipation protocol as indicated   ELECTROLYTES -follow labs as needed -replace as needed -pharmacy consultation and following   ACUTE ANEMIA- TRANSFUSE AS NEEDED IRON THERAPY STARTED AS OUTPATIENT     Best practice (right click and "Reselect all SmartList Selections" daily)  Diet: as tolerated Mobility:  OOB  Code Status:  FULL Disposition:ICU  Labs   CBC: Recent Labs  Lab 07/07/22 1035 07/08/22 0358  WBC 12.2* 11.1*  HGB 10.2* 9.4*  HCT 30.7* 27.9*  MCV 82.3 79.9*  PLT 226 [id number]    Basic Metabolic Panel: Recent Labs  Lab 07/07/22 1035 07/08/22 0358  NA 129* 133*  K 3.6 3.0*  CL 100 99  CO2 19* 20*  GLUCOSE 204* 100*  BUN 40* 37*  CREATININE 1.22* 1.24*  CALCIUM 8.9 9.0   GFR: Estimated Creatinine Clearance: 21.5 mL/min (A) (by C-G formula based on SCr of 1.24 mg/dL (H)). Recent Labs  Lab 07/07/22 1035 07/08/22 0358  WBC 12.2* 11.1*    Liver Function Tests: Recent Labs  Lab 07/07/22 1035  AST 18  ALT 15  ALKPHOS 82  BILITOT 0.7  PROT 6.5  ALBUMIN 3.3*   No results for input(s): "LIPASE", "AMYLASE" in the last 168 hours. No results for input(s): "AMMONIA" in the last 168 hours.  ABG No results found for: "PHART", "PCO2ART", "PO2ART", "HCO3", "TCO2", "ACIDBASEDEF", "O2SAT"   Coagulation Profile: Recent Labs  Lab 07/07/22 1135  INR 1.2    Cardiac Enzymes: No results for input(s): "CKTOTAL", "CKMB", "CKMBINDEX", "TROPONINI" in the last 168 hours.  HbA1C: Hgb A1c MFr Bld  Date/Time Value Ref Range Status  06/30/2022 10:44 AM 7.3 (H) 4.6 - 6.5 % Final    Comment:    Glycemic Control Guidelines for People with  Diabetes:Non Diabetic:  <6%Goal of Therapy: <7%Additional Action Suggested:  >8%   01/04/2022 10:42 AM 7.5 (H) 4.6 - 6.5 % Final    Comment:    Glycemic Control Guidelines for People with Diabetes:Non Diabetic:  <6%Goal of Therapy: <7%Additional Action Suggested:  >8%     CBG: Recent Labs  Lab 07/07/22 1747 07/07/22 2117 07/08/22 0809  GLUCAP 163* 82 151*     Past Medical History:  She,  has a past medical history of Aortic stenosis, Asthma without status asthmaticus, Closed right hip fracture ([place]) (123456), Colitis, Complication of anesthesia, Depression, Diabetes mellitus type 2, uncomplicated ([place]), Essential hypertension, Family history of adverse reaction to anesthesia, GERD (gastroesophageal reflux disease), Hearing loss in left ear, Hyperlipidemia, Hypertension, Low back pain with left-sided sciatica (04/29/2018), Myocardial infarction ([place]) (2018), Non-obstructive CAD (coronary artery disease), NSTEMI (non-ST elevated myocardial infarction) ([place]) (08/30/2016), NSTEMI (non-ST elevated myocardial infarction) ([place]) (08/18/2020), Nummular eczema (01/16/2011), Peripheral vascular disease ([place]), Periprosthetic fracture around internal prosthetic hip joint, initial encounter, PONV (postoperative nausea and vomiting), Positive H.  pylori test, Pre-diabetes, Suspected COVID-19 virus infection (01/10/2019), and Viral gastroenteritis due to [place]-like agent (Nov 2012).   Surgical History:   Past Surgical History:  Procedure Laterality Date   ABDOMINAL HYSTERECTOMY     BLADDER SURGERY     CARDIAC CATHETERIZATION     CATARACT EXTRACTION W/PHACO Right 09/29/2020   Procedure: CATARACT EXTRACTION PHACO AND INTRAOCULAR LENS PLACEMENT ([place]) RIGHT VISION BLUE 7.87 00:58.7 ;  Surgeon: Leandrew Koyanagi, MD;  Location: [place];  Service: Ophthalmology;  Laterality: Right;   CATARACT EXTRACTION W/PHACO Left 10/13/2020   Procedure: CATARACT EXTRACTION PHACO AND INTRAOCULAR LENS PLACEMENT  ([place]) LEFT VISION BLUE;  Surgeon: Leandrew Koyanagi, MD;  Location: [facility];  Service: Ophthalmology;  Laterality: Left;  4.94 1:03.0 7.9%   CHOLECYSTECTOMY     COLONOSCOPY     06/16/1987, 02/20/1995, 05/04/1999, 02/14/2005, 05/17/2007   ESOPHAGOGASTRODUODENOSCOPY     05/11/1987, 06/13/1995, 10/09/1995, 05/04/1999, 01/16/2002   FLEXIBLE SIGMOIDOSCOPY N/A 05/02/2017   Procedure: Otho Darner SIGMOIDOSCOPY;  Surgeon: Manya Silvas, MD;  Location: [facility] ENDOSCOPY;  Service: Endoscopy;  Laterality: N/A;   HEMORRHOID SURGERY N/A 05/14/2017   Procedure: EXTERNAL THROMBOSED HEMORRHOIDECTOMY;  Surgeon: Robert Bellow, MD;  Location: [facility] ORS;  Service: General;  Laterality: N/A;   HIP ARTHROPLASTY Right 02/17/2017   Procedure: ARTHROPLASTY BIPOLAR HIP (HEMIARTHROPLASTY);  Surgeon: Claud Kelp, MD;  Location: [facility] ORS;  Service: Orthopedics;  Laterality: Right;   LEFT HEART CATH AND CORONARY ANGIOGRAPHY N/A 08/31/2016   Procedure: Left Heart Cath and Coronary Angiography;  Surgeon: Wellington Hampshire, MD;  Location: [facility] CV LAB;  Service: Cardiovascular;  Laterality: N/A;   ORIF PERIPROSTHETIC FRACTURE Right 05/27/2017   ORIF PERIPROSTHETIC FRACTURE Right 05/27/2017   Procedure: OPEN REDUCTION INTERNAL FIXATION (ORIF) PERIPROSTHETIC FRACTURE;  Surgeon: Mcarthur Rossetti, MD;  Location: [place];  Service: Orthopedics;  Laterality: Right;   TOTAL HIP ARTHROPLASTY Right    uterian prolapse       Social History:   reports that she has never smoked. She has never used smokeless tobacco. She reports that she does not drink alcohol and does not use drugs.   Family History:  Her family history includes Breast cancer (age of onset: 36) in her sister; Breast cancer (age of onset: 17) in her mother; Cancer in her mother; Colon cancer in her brother, brother, and father; Stroke in her sister.   Allergies Allergies  Allergen Reactions   Clindamycin     dizziness   Oxytetracycline  Hives   Phenobarbital Hives   Atropine Other (See Comments)    Reaction: unknown   Belladonna Alkaloids Other (See Comments)    Reaction: unknown   Codeine Nausea And Vomiting   Darvon [Propoxyphene] Nausea And Vomiting   Demerol [Meperidine] Nausea And Vomiting   Methocarbamol Other (See Comments)    Reaction: unknown   Penicillins Other (See Comments)    Pt and family unsure of details.  Can tolerate cephalosporins   Sulfa Antibiotics Other (See Comments)    Reaction: unknown   Iron Other (See Comments)    High dose prescription gives her diarrhea     Home Medications  Prior to Admission medications   Medication Sig Start Date End Date Taking? Authorizing Provider  acetaminophen (TYLENOL) 500 MG tablet Take 1,000 mg by mouth every 6 (six) hours as needed for moderate pain or headache.   Yes [provider]  albuterol (PROVENTIL) (2.5 MG/3ML) 0.083% nebulizer solution Take 3 mLs (2.5 mg total) by nebulization every  6 (six) hours as needed for wheezing or shortness of breath. 11/24/21  Yes [name], MD  albuterol (VENTOLIN HFA) 108 (90 Base) MCG/ACT inhaler Inhale 2 puffs into the lungs every 6 (six) hours as needed for wheezing or shortness of breath. 10/26/21  Yes [name], MD  atorvastatin (LIPITOR) 40 MG tablet TAKE 1 TABLET BY MOUTH DAILY Patient taking differently: Take 40 mg by mouth at bedtime. 03/10/22  Yes [name], MD  carvedilol (COREG) 6.25 MG tablet Take 1 tablet (6.25 mg total) by mouth 2 (two) times daily with a meal. 03/17/22  Yes Wellington Hampshire, MD  clopidogrel (PLAVIX) 75 MG tablet TAKE ONE TABLET BY MOUTH EVERY DAY 04/03/22  Yes [name], MD  CRANBERRY PO Take 2 capsules by mouth 2 (two) times daily.   Yes [provider]  famotidine (PEPCID) 10 MG tablet Take 10 mg by mouth daily as needed for heartburn or indigestion.   Yes [provider]  glipiZIDE (GLUCOTROL) 5 MG tablet TAKE ONE TABLET TWICE A DAY BEFORE  MEALS 01/25/22  Yes [name], MD  hydrALAZINE (APRESOLINE) 25 MG tablet TAKE THREE TABLETS BY MOUTH WITH BREAKFAST  TAKE THREE TABLETS WITH LUNCHAND TAKE TWO TABLETS WITH DINNER AS DIRECTED 04/19/22  Yes [name], MD  hydrochlorothiazide (MICROZIDE) 12.5 MG capsule TAKE 1 CAPSULE BY MOUTH ONCE DAILY 05/10/22  Yes [name], MD  Iron, Ferrous Sulfate, 325 (65 Fe) MG TABS Take 325 mg by mouth daily. 07/04/22  Yes [name], MD  isosorbide mononitrate (IMDUR) 30 MG 24 hr tablet TAKE ONE TABLET BY MOUTH EVERY MORNING AND TAKE TWO TABLETS EVERY EVENING 03/17/22  Yes Wellington Hampshire, MD  Lactobacillus (ULTIMATE PROBIOTIC FORMULA) CAPS Take 1 capsule by mouth daily with supper.   Yes [provider]  lansoprazole (PREVACID) 30 MG capsule TAKE 1 CAPSULE EVERY DAY BEFORE SUPPER Patient taking differently: Take 30 mg by mouth daily as needed. 11/22/21  Yes [name], MD  loratadine (CLARITIN) 10 MG tablet Take 10 mg daily by mouth.    Yes [provider]  losartan (COZAAR) 100 MG tablet TAKE ONE TABLET BY MOUTH EVERY DAY 01/05/22  Yes [name], MD  nitroGLYCERIN (NITROSTAT) 0.4 MG SL tablet Place 1 tablet (0.4 mg total) under the tongue every 5 (five) minutes as needed for chest pain. 07/29/18  Yes [name], MD  sertraline (ZOLOFT) 100 MG tablet TAKE 1 TABLET BY MOUTH DAILY. 04/03/22  Yes [name], MD  Trospium Chloride 60 MG CP24 TAKE ONE CAPSULE EACH MORNING BEFORE BREAKFAST Patient taking differently: Take 1 capsule by mouth daily. At lunch 01/10/22  Yes Dutch Quint B, FNP  vitamin C (ASCORBIC ACID) 500 MG tablet Take 1 tablet (500 mg total) by mouth 2 (two) times daily. 05/04/17  Yes [name], MD  VITAMIN D PO Take 1 capsule by mouth in the morning.   Yes [provider]  CONTOUR NEXT TEST test strip USE AS DIRECTED TWICE A DAY 05/10/22   [name], MD  COVID-19 Home Collection Test KIT Use as needed for signs and symptoms  of COVID INFECTION 11/24/21   [name], MD       Corrin Parker, M.D.  [facility]  Medical Director [place] Director [facility] Cardio-Pulmonary Department

## 2022-07-08 NOTE — Progress Notes (Signed)
Patient Name: Maureen Morales   Patient admitted 3/8 with complaints of shortness of breath and weakness over the last month History of coronary artery disease with a non-STEMI 4/22 in the setting of hypertensive urgency, catheterization>> medical therapy; moderate aortic stenosis and anemia Troponin 550 >> 518 started on heparin but type II secondary to mismatch -- recommended for 48 hours Echocardiogram normal LV function Chest x-ray infiltrates concerning for possible pneumonia Anemia chronic with acute change  IV Iron   SUBJECTIVE: Feels better.  Less shortness of breath.  Past Medical History:  Diagnosis Date   Aortic stenosis    a. 08/2016 Echo: Hyperdynamic LV fxn, mild AS (mean grad 40mHg), mild MR, mild PAH; b. 05/2017 Echo: Hyperdynamic LV fxn, mod AS (mean grad 218mg, valve area 1.84). PASP 10080m.   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 08/2017 Renal U/S: No RAS.   Low back pain with left-sided sciatica 04/29/2018   [facility]   Non-obstructive CAD (coronary artery disease)    a. 08/2016 NSTEMI/Cath: mild to mod nonobs dzs including 60% mRCA stenosis-->Med rx.   NSTEMI (non-ST elevated myocardial infarction) ([place]CC[place]/06/2016   NSTEMI (non-ST elevated myocardial infarction) (HCCR[place]/20/2022   Nummular eczema 01/16/2011   Occurring on chest and under breast.  Treated by Dr DasEvorn Gongth fluocinonide 0.05% topical 06/26/12    Peripheral vascular disease ([place]CC[place]  Periprosthetic fracture around internal  prosthetic hip joint, initial encounter    PONV (postoperative nausea and vomiting)    Positive H. pylori test    Pre-diabetes    Suspected COVID-19 virus infection 01/10/2019   Patient reporting malaise and headache for the past 3 days,  Similar to presentation when she was positive for  COVID 19 INFECTION  July 2 .  Previous inection was caused by exposure to COVID positive caregiver.  Repeat scenario:  Same caregiver's mother tested positive on Monday and caregiver saw current patient on Tuesday     Viral gastroenteritis due to [place]-like agent Nov 2012    Scheduled Meds:  Scheduled Meds:  albuterol  2.5 mg Nebulization Q4H   atorvastatin  40 mg Oral Daily   budesonide (PULMICORT) nebulizer solution  0.5 mg Nebulization BID   carvedilol  6.25 mg Oral BID WC   clopidogrel  75 mg Oral Daily   ferrous sulfate  325 mg Oral Daily   [START ON 07/09/2022] furosemide  20 mg Intravenous Daily   hydrALAZINE  25 mg Oral Q8H   insulin aspart  0-5 Units Subcutaneous QHS   insulin aspart  0-9 Units Subcutaneous TID WC   isosorbide mononitrate  60 mg Oral Daily   loratadine  10 mg Oral Daily   losartan  100 mg Oral Daily   methylPREDNISolone (SOLU-MEDROL) injection  20 mg Intravenous Q12H   pantoprazole  40 mg Oral Daily   sertraline  100 mg Oral Daily   sodium chloride  flush  3 mL Intravenous Q12H   Continuous Infusions:  sodium chloride     azithromycin 500 mg (07/08/22 1440)   heparin 850 Units/hr (07/08/22 0523)   sodium chloride, acetaminophen, nitroGLYCERIN, ondansetron (ZOFRAN) IV, sodium chloride flush    PHYSICAL EXAM Vitals:   07/08/22 0500 07/08/22 0909 07/08/22 1257 07/08/22 1345  BP:  (!) 106/49 (!) 121/43   Pulse:  80 75 78  Resp:   18 18  Temp:   98.2 F (36.8 C)   TempSrc:   Oral   SpO2:  97% 96% 95%  Weight: 59.4 kg     Height:        Well developed and nourished in no acute distress HENT normal Neck supple with JVP-  flat  Clear Regular rate and rhythm,  no murmurs or gallops Abd-soft with active BS No Clubbing cyanosis edema Skin-warm and dry A & Oriented  Grossly normal sensory and motor function   TELEMETRY: Reviewed personnally pt in sinus with PVCs     Intake/Output Summary (Last 24 hours) at 07/08/2022 1451 Last data filed at 07/08/2022 0812 Gross per 24 hour  Intake 680.32 ml  Output 700 ml  Net -19.68 ml    LABS: Basic Metabolic Panel: Recent Labs  Lab 07/07/22 1035 07/08/22 0358  NA 129* 133*  K 3.6 3.0*  CL 100 99  CO2 19* 20*  GLUCOSE 204* 100*  BUN 40* 37*  CREATININE 1.22* 1.24*  CALCIUM 8.9 9.0   Cardiac Enzymes: No results for input(s): "CKTOTAL", "CKMB", "CKMBINDEX", "TROPONINI" in the last 72 hours. CBC: Recent Labs  Lab 07/07/22 1035 07/08/22 0358  WBC 12.2* 11.1*  HGB 10.2* 9.4*  HCT 30.7* 27.9*  MCV 82.3 79.9*  PLT 226 223   PROTIME: Recent Labs    07/07/22 1135  LABPROT 15.2  INR 1.2   Liver Function Tests: Recent Labs    07/07/22 1035  AST 18  ALT 15  ALKPHOS 82  BILITOT 0.7  PROT 6.5  ALBUMIN 3.3*   No results for input(s): "LIPASE", "AMYLASE" in the last 72 hours. BNP: BNP (last 3 results) Recent Labs    07/07/22 1035  BNP 300.1*    ProBNP (last 3 results) Recent Labs    01/04/22 1042 06/30/22 1044  PROBNP 218.0* 228.0*    D-Dimer: No results for input(s): "DDIMER" in the last 72 hours. Hemoglobin A1C: No results for input(s): "HGBA1C" in the last 72 hours. Fasting Lipid Panel: No results for input(s): "CHOL", "HDL", "LDLCALC", "TRIG", "CHOLHDL", "LDLDIRECT" in the last 72 hours. Thyroid Function Tests: No results for input(s): "TSH", "T4TOTAL", "T3FREE", "THYROIDAB" in the last 72 hours.  Invalid input(s): "FREET3" Anemia Panel: Recent Labs    07/07/22 1120  FERRITIN 205  TIBC 221*  IRON 29       ASSESSMENT AND PLAN:  Troponin elevation known nonobstructive coronary disease  Shortness of breath  Pneumonia?  Weakness  Aortic  stenosis-moderate  Anemia  Hypokalemia  Suspect troponins were secondary to demand from her anemia.  She is not feeling better with iron, should take a while.  Would have a low threshold for transfusion.  Also on antibiotics  2 dose of Lasix and is euvolemic significantly hypokalemic which is being repleted    Signed, Virl Axe MD  07/08/2022

## 2022-07-08 NOTE — Consult Note (Signed)
[place] for heparin drip Indication: chest pain/ACS  Allergies  Allergen Reactions   Clindamycin     dizziness   Oxytetracycline Hives   Phenobarbital Hives   Atropine Other (See Comments)    Reaction: unknown   Belladonna Alkaloids Other (See Comments)    Reaction: unknown   Codeine Nausea And Vomiting   Darvon [Propoxyphene] Nausea And Vomiting   Demerol [Meperidine] Nausea And Vomiting   Methocarbamol Other (See Comments)    Reaction: unknown   Penicillins Other (See Comments)    Pt and family unsure of details.  Can tolerate cephalosporins   Sulfa Antibiotics Other (See Comments)    Reaction: unknown   Iron Other (See Comments)    High dose prescription gives her diarrhea    Patient Measurements: Height: '5\' 1"'$  (154.9 cm) Weight: 59.4 kg (130 lb 15.3 oz) IBW/kg (Calculated) : 47.8 Heparin Dosing Weight: 60.9 kg  Vital Signs: Temp: 98.3 F (36.8 C) (03/09 0416) Temp Source: Oral (03/08 1844) BP: 126/51 (03/09 0416) Pulse Rate: 83 (03/09 0416)  Labs: Recent Labs    07/07/22 1035 07/07/22 1120 07/07/22 1135 07/07/22 1945 07/08/22 0358  HGB 10.2*  --   --   --  9.4*  HCT 30.7*  --   --   --  27.9*  PLT 226  --   --   --  223  APTT  --   --  38*  --   --   LABPROT  --   --  15.2  --   --   INR  --   --  1.2  --   --   HEPARINUNFRC  --   --   --  0.35 0.29*  CREATININE 1.22*  --   --   --  1.24*  TROPONINIHS 550* 518*  --   --   --      Estimated Creatinine Clearance: 21.5 mL/min (A) (by C-G formula based on SCr of 1.24 mg/dL (H)).   Medical History: Past Medical History:  Diagnosis Date   Aortic stenosis    a. 08/2016 Echo: Hyperdynamic LV fxn, mild AS (mean grad 37mHg), mild MR, mild PAH; b. 05/2017 Echo: Hyperdynamic LV fxn, mod AS (mean grad 269mg, valve area 1.84). PASP 10055m.   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 08/2017 Renal U/S: No RAS.   Low back pain with left-sided sciatica 04/29/2018   [facility] ([facility]   Non-obstructive CAD (coronary artery disease)    a. 08/2016 NSTEMI/Cath: mild to mod nonobs dzs including 60% mRCA stenosis-->Med rx.   NSTEMI (non-ST elevated myocardial infarction) (HCC[place]/06/2016   NSTEMI (non-ST elevated myocardial infarction) (HCCA[facility] [place] [facility] [place]/20/2022   Nummular eczema 01/16/2011   Occurring on chest and under breast.  Treated by Dr DasEvorn Gongth fluocinonide 0.05% topical 06/26/12    Peripheral vascular disease ([place]CC[place]  Periprosthetic fracture around internal prosthetic hip joint, initial encounter  PONV (postoperative nausea and vomiting)    Positive H. pylori test    Pre-diabetes    Suspected COVID-19 virus infection 01/10/2019   Patient reporting malaise and headache for the past 3 days,  Similar to presentation when she was positive for  COVID 19 INFECTION  July 2 .  Previous inection was caused by exposure to COVID positive caregiver.  Repeat scenario:  Same caregiver's mother tested positive on Monday and caregiver saw current patient on Tuesday     Viral gastroenteritis due to [place]-like agent Nov 2012    Medications:  No home anticoagulation per pharmacist review  Assessment: 89 yo female presented to ED with chest pain.  PMH includes HTN, HLD, and CAD.  Patient found to have elevated troponin so pharmacy consulted to initiate heparin infusion.  Date Time HL Rate/Comment 3/8 1945 0.35 Therapeutic x1 / 700 u/hr 3/9 0358 0.29 Subtherapeutic  Goal of Therapy:  Heparin level 0.3-0.7  units/ml Monitor platelets by anticoagulation protocol: Yes   Plan:  Increase heparin infusion to 850 units/hr Recheck HL in 8 hr after rate change Continue to monitor H&H and platelets daily while on heparin gtt.  Renda Rolls, PharmD, [facility] 07/08/2022 5:17 AM

## 2022-07-08 NOTE — Consult Note (Signed)
[place] for heparin drip Indication: chest pain/ACS  Allergies  Allergen Reactions   Clindamycin     dizziness   Oxytetracycline Hives   Phenobarbital Hives   Atropine Other (See Comments)    Reaction: unknown   Belladonna Alkaloids Other (See Comments)    Reaction: unknown   Codeine Nausea And Vomiting   Darvon [Propoxyphene] Nausea And Vomiting   Demerol [Meperidine] Nausea And Vomiting   Methocarbamol Other (See Comments)    Reaction: unknown   Penicillins Other (See Comments)    Pt and family unsure of details.  Can tolerate cephalosporins   Sulfa Antibiotics Other (See Comments)    Reaction: unknown   Iron Other (See Comments)    High dose prescription gives her diarrhea    Patient Measurements: Height: '5\' 1"'$  (154.9 cm) Weight: 59.4 kg (130 lb 15.3 oz) IBW/kg (Calculated) : 47.8 Heparin Dosing Weight: 60.9 kg  Vital Signs: Temp: 98.1 F (36.7 C) (03/09 2031) Temp Source: Oral (03/09 2031) BP: 142/64 (03/09 2031) Pulse Rate: 76 (03/09 2031)  Labs: Recent Labs    07/07/22 1035 07/07/22 1120 07/07/22 1135 07/07/22 1945 07/08/22 0358 07/08/22 1356 07/08/22 2207  HGB 10.2*  --   --   --  9.4*  --   --   HCT 30.7*  --   --   --  27.9*  --   --   PLT 226  --   --   --  223  --   --   APTT  --   --  38*  --   --   --   --   LABPROT  --   --  15.2  --   --   --   --   INR  --   --  1.2  --   --   --   --   HEPARINUNFRC  --   --   --    < > 0.29* 0.42 0.55  CREATININE 1.22*  --   --   --  1.24*  --   --   TROPONINIHS 550* 518*  --   --   --   --   --    < > = values in this interval not displayed.     Estimated Creatinine Clearance: 21.5 mL/min (A) (by C-G formula based on SCr of 1.24 mg/dL (H)).   Medical History: Past Medical History:  Diagnosis Date   Aortic stenosis    a. 08/2016 Echo: Hyperdynamic LV fxn, mild AS (mean grad 66mHg), mild MR, mild PAH; b. 05/2017 Echo: Hyperdynamic LV fxn, mod AS (mean grad  280mg, valve area 1.84). PASP 10055m.   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 08/2017 Renal U/S: No RAS.   Low back pain with left-sided sciatica 04/29/2018   [facility] ([facility]   Non-obstructive CAD (coronary artery disease)    a. 08/2016 NSTEMI/Cath: mild to mod  nonobs dzs including 60% mRCA stenosis-->Med rx.   NSTEMI (non-ST elevated myocardial infarction) ([place]) 08/30/2016   NSTEMI (non-ST elevated myocardial infarction) ([place]) 08/18/2020   Nummular eczema 01/16/2011   Occurring on chest and under breast.  Treated by Dr Evorn Gong with fluocinonide 0.05% topical 06/26/12    Peripheral vascular disease ([place])    Periprosthetic fracture around internal prosthetic hip joint, initial encounter    PONV (postoperative nausea and vomiting)    Positive H. pylori test    Pre-diabetes    Suspected COVID-19 virus infection 01/10/2019   Patient reporting malaise and headache for the past 3 days,  Similar to presentation when she was positive for  COVID 19 INFECTION  July 2 .  Previous inection was caused by exposure to COVID positive caregiver.  Repeat scenario:  Same caregiver's mother tested positive on Monday and caregiver saw current patient on Tuesday     Viral gastroenteritis due to [place]-like agent Nov 2012    Medications:  No home anticoagulation per pharmacist review  Assessment: 89 yo female presented to ED with chest pain.  PMH includes HTN, HLD, and CAD.  Patient found to have elevated troponin so pharmacy consulted to initiate  heparin infusion.  Date Time HL Rate/Comment 3/8 1945 0.35 Therapeutic x1 / 700 u/hr 3/9 0358 0.29 Subtherapeutic 3/9 1356 0.42 Therap x 1 3/9 2207 0.55 Therapeutic x 2  Goal of Therapy:  Heparin level 0.3-0.7 units/ml Monitor platelets by anticoagulation protocol: Yes   Plan:  Continue heparin infusion at 850 units/hr Recheck HL daily w/ AM labs while therapeutic  Continue to monitor H&H and platelets daily while on heparin gtt.  Renda Rolls, PharmD, [facility] 07/08/2022 10:40 PM

## 2022-07-08 NOTE — Progress Notes (Signed)
PROGRESS NOTE    Maureen Morales  [id number] DOB: [birth year] DOA: 07/07/2022 PCP: [name], MD    Brief Narrative:  89 y.o. female with medical history significant of chronic diastolic dysfunction, stable coronary disease managed medically, diabetes mellitus, aortic stenosis, refractory hypertension who presents to the ED with chief complaint of progressive weakness and shortness of breath over 1 month.  Per the daughter at bedside fatigue and decreased exercise tolerance have worsened over the past 2 to 3 weeks.  This is initially attributed to iron deficiency anemia.  Patient saw her primary care physician and was started on oral iron supplementation.  On 1 AM the day of presentation she started to experience pain in the center of her chest associated with worsening shortness of breath.  Was given 1 sublingual nitroglycerin tablet with significant resolution in symptoms.  Home COVID test performed and was negative.   Patient has a history of CAD and was hospitalized in April 2022 with NSTEMI in setting of hypertensive urgency.  Underwent cardiac catheterization at that time.  No stents were placed however patient was managed medically with antiplatelet agents.   On presentation to the ED the patient does appear weak overall hemodynamically stable however.  Imaging survey significant for chest x-ray with hazy bilateral basilar airspace opacities.  High-sensitivity troponin elevated at 550, BNP minimally elevated at 300.   Assessment & Plan:   Principal Problem:   NSTEMI (non-ST elevated myocardial infarction) ([place]) Active Problems:   Essential hypertension   Hyperlipidemia   Rheumatic aortic stenosis   Normocytic anemia   CAD (coronary artery disease)   Diabetes mellitus type 2 in nonobese (HCC)   Chronic diastolic congestive heart failure (HCC)   Chest pain   Demand ischemia  NSTEMI History of CAD Stable angina Chest pain Initial sensitivity troponin 550.  Unclear whether  this represents true ACS versus supply/demand ischemia.  Considering history of CAD will treat as NSTEMI. Plan: Continue heparin GTT x 48 hours Continue home Plavix Continue beta-blocker and ACE inhibitor Continue long-acting nitrate SL nitroglycerin as needed chest pain Troponin peaked at 550, no indication to trend at this point Stat EKG as needed chest pain Telemetry monitoring Cardiology consult    Suspected acute on chronic diastolic congestive heart failure Patient does have some signs of fluid overload including some hazy opacities on the chest felt to be due to interstitial edema.  Interestingly BNP only slightly elevated Plan: Continue IV Lasix, dose decreased to 20 mg daily Beta-blocker and ACE inhibitor as above Strict ins and outs, daily weights Reassess tomorrow for diuretic need  Weakness Shortness of breath The etiology of this is unclear.  Patient does not seem to have a increased volume status.  She was diuresed with some improvement however continues to endorse significant shortness of breath.  I suspect this may be due to acute pulmonary issues.  Will pursue CT angio thorax with and without contrast.  This will help rule out pulmonary embolism as well as evaluate lung parenchyma.  Possible pulmonary consultation pending results of study.   Refractory hypertension Patient is on aggressive dosing regimen of several agents.  Will continue while hospitalized.  As needed hydralazine for SBP greater than 165   Iron deficiency anemia Patient was presenting diagnosed with IDA and placed on oral iron supplementation.  Received IV Venofer 300 mg x 1 on 3/8.  Resume home oral iron replacement 325 mg daily    Weakness Shortness of breath Suspected secondary to above acute medical  issues.  Treatment as above and reassess   AKI on chronic kidney disease stage IIIa Creatinine slightly above baseline.  Possible due to cardiorenal syndrome.  Diuresis as above and reevaluate.   Diuretic dose decreased as above   Type 2 diabetes mellitus with hyperglycemia Patient on oral agents at home.  Will hold.  Sensitive sliding scale, carb modified diet, Accu-Cheks before meals and at bedtime   Hyponatremia Felt to be secondary to fluid overload noted state/hypervolemic hyponatremia.  Diuresis as above.  Reassess  Hyperlipidemia PTA statin     Depression PTA Zoloft   Goals of care Discussed CODE STATUS at length with patient and family are at bedside.  Patient confirms she is a DNR.  She was DNR on previous admissions.   DVT prophylaxis: Heparin GTT Code Status: DNR Family Communication: Caregiver at bedside 3/9 Disposition Plan: Status is: Observation The patient will require care spanning > 2 midnights and should be moved to inpatient because: NSTEMI on heparin gtt.  Weakness and shortness of breath with further diagnostic investigation in progress   Level of care: Telemetry Cardiac  Consultants:  Cardiology  Procedures:  None  Antimicrobials: None   Subjective: Seen and examined.  Lying in bed.  Continues to appear short of breath.  Objective: Vitals:   07/07/22 2308 07/08/22 0416 07/08/22 0500 07/08/22 0909  BP: (!) 135/56 (!) 126/51  (!) 106/49  Pulse: 79 83  80  Resp: 20 18    Temp: 98.7 F (37.1 C) 98.3 F (36.8 C)    TempSrc:      SpO2: 96% (!) 89%  97%  Weight:   59.4 kg   Height:        Intake/Output Summary (Last 24 hours) at 07/08/2022 1110 Last data filed at 07/08/2022 [id number] Gross per 24 hour  Intake 680.32 ml  Output 700 ml  Net -19.68 ml   Filed Weights   07/07/22 0931 07/08/22 0500  Weight: 63.5 kg 59.4 kg    Examination:  General exam: No acute distress.  Appears frail Respiratory system: Coarse crackles bilaterally.  Normal work of breathing.  Room air Cardiovascular system: S1-S2, RRR, no murmurs, trace pedal edema Gastrointestinal system: Thin, soft, NT/ND, normal bowel sounds Central nervous system: Alert and  oriented. No focal neurological deficits. Extremities: Symmetric 5 x 5 power. Skin: No rashes, lesions or ulcers Psychiatry: Judgement and insight appear normal. Mood & affect appropriate.     Data Reviewed: I have personally reviewed following labs and imaging studies  CBC: Recent Labs  Lab 07/07/22 1035 07/08/22 0358  WBC 12.2* 11.1*  HGB 10.2* 9.4*  HCT 30.7* 27.9*  MCV 82.3 79.9*  PLT 226 [id number]   Basic Metabolic Panel: Recent Labs  Lab 07/07/22 1035 07/08/22 0358  NA 129* 133*  K 3.6 3.0*  CL 100 99  CO2 19* 20*  GLUCOSE 204* 100*  BUN 40* 37*  CREATININE 1.22* 1.24*  CALCIUM 8.9 9.0   GFR: Estimated Creatinine Clearance: 21.5 mL/min (A) (by C-G formula based on SCr of 1.24 mg/dL (H)). Liver Function Tests: Recent Labs  Lab 07/07/22 1035  AST 18  ALT 15  ALKPHOS 82  BILITOT 0.7  PROT 6.5  ALBUMIN 3.3*   No results for input(s): "LIPASE", "AMYLASE" in the last 168 hours. No results for input(s): "AMMONIA" in the last 168 hours. Coagulation Profile: Recent Labs  Lab 07/07/22 1135  INR 1.2   Cardiac Enzymes: No results for input(s): "CKTOTAL", "CKMB", "CKMBINDEX", "TROPONINI" in the last 168  hours. BNP (last 3 results) Recent Labs    01/04/22 1042 06/30/22 1044  PROBNP 218.0* 228.0*   HbA1C: No results for input(s): "HGBA1C" in the last 72 hours. CBG: Recent Labs  Lab 07/07/22 1747 07/07/22 2117 07/08/22 0809  GLUCAP 163* 82 151*   Lipid Profile: No results for input(s): "CHOL", "HDL", "LDLCALC", "TRIG", "CHOLHDL", "LDLDIRECT" in the last 72 hours. Thyroid Function Tests: No results for input(s): "TSH", "T4TOTAL", "FREET4", "T3FREE", "THYROIDAB" in the last 72 hours. Anemia Panel: Recent Labs    07/07/22 1120  FERRITIN 205  TIBC 221*  IRON 29   Sepsis Labs: No results for input(s): "PROCALCITON", "LATICACIDVEN" in the last 168 hours.  Recent Results (from the past 240 hour(s))  Resp panel by RT-PCR (RSV, Flu A&B, Covid) Anterior  Nasal Swab     Status: None   Collection Time: 07/07/22 10:02 AM   Specimen: Anterior Nasal Swab  Result Value Ref Range Status   SARS Coronavirus 2 by RT PCR NEGATIVE NEGATIVE Final    Comment: (NOTE) SARS-CoV-2 target nucleic acids are NOT DETECTED.  The SARS-CoV-2 RNA is generally detectable in upper respiratory specimens during the acute phase of infection. The lowest concentration of SARS-CoV-2 viral copies this assay can detect is 138 copies/mL. A negative result does not preclude SARS-Cov-2 infection and should not be used as the sole basis for treatment or other patient management decisions. A negative result may occur with  improper specimen collection/handling, submission of specimen other than nasopharyngeal swab, presence of viral mutation(s) within the areas targeted by this assay, and inadequate number of viral copies(<138 copies/mL). A negative result must be combined with clinical observations, patient history, and epidemiological information. The expected result is Negative.  Fact Sheet for Patients:  EntrepreneurPulse.com.au  Fact Sheet for Healthcare Providers:  IncredibleEmployment.be  This test is no t yet approved or cleared by the [place] FDA and  has been authorized for detection and/or diagnosis of SARS-CoV-2 by FDA under an Emergency Use Authorization (EUA). This EUA will remain  in effect (meaning this test can be used) for the duration of the COVID-19 declaration under Section 564(b)(1) of the Act, 21 U.S.C.section 360bbb-3(b)(1), unless the authorization is terminated  or revoked sooner.       Influenza A by PCR NEGATIVE NEGATIVE Final   Influenza B by PCR NEGATIVE NEGATIVE Final    Comment: (NOTE) The Xpert Xpress SARS-CoV-2/FLU/RSV plus assay is intended as an aid in the diagnosis of influenza from Nasopharyngeal swab specimens and should not be used as a sole basis for treatment. Nasal washings  and aspirates are unacceptable for Xpert Xpress SARS-CoV-2/FLU/RSV testing.  Fact Sheet for Patients: EntrepreneurPulse.com.au  Fact Sheet for Healthcare Providers: IncredibleEmployment.be  This test is not yet approved or cleared by the [place] FDA and has been authorized for detection and/or diagnosis of SARS-CoV-2 by FDA under an Emergency Use Authorization (EUA). This EUA will remain in effect (meaning this test can be used) for the duration of the COVID-19 declaration under Section 564(b)(1) of the Act, 21 U.S.C. section 360bbb-3(b)(1), unless the authorization is terminated or revoked.     Resp Syncytial Virus by PCR NEGATIVE NEGATIVE Final    Comment: (NOTE) Fact Sheet for Patients: EntrepreneurPulse.com.au  Fact Sheet for Healthcare Providers: IncredibleEmployment.be  This test is not yet approved or cleared by the [place] FDA and has been authorized for detection and/or diagnosis of SARS-CoV-2 by FDA under an Emergency Use Authorization (EUA). This EUA will remain in effect (meaning  this test can be used) for the duration of the COVID-19 declaration under Section 564(b)(1) of the Act, 21 U.S.C. section 360bbb-3(b)(1), unless the authorization is terminated or revoked.  Performed at [facility], [address] [place], [place] [address]          Radiology Studies: ECHOCARDIOGRAM COMPLETE  Result Date: 07/07/2022    ECHOCARDIOGRAM REPORT   Patient Name:   PAITYNN OLINSKI Date of Exam: 07/07/2022 Medical Rec #:  [id number]     Height:       61.0 in Accession #:    [id number]    Weight:       140.0 lb Date of Birth:  [birth year]    BSA:          1.623 m Patient Age:    72 years      BP:           160/63 mmHg Patient Gender: F             HR:           76 bpm. Exam Location:  [facility] Procedure: 2D Echo, Cardiac Doppler and Color Doppler Indications:     CHF  History:         Patient  has prior history of Echocardiogram examinations, most                  recent 08/19/2020. CHF, CAD, Acute MI and Previous Myocardial                  Infarction; Risk Factors:Diabetes, Hypertension and                  Dyslipidemia.  Sonographer:     Wenda Low Referring Phys:  [id number] [name] Diagnosing Phys: Ida Rogue MD IMPRESSIONS  1. Left ventricular ejection fraction, by estimation, is 60 to 65%. The left ventricle has normal function. The left ventricle has no regional wall motion abnormalities. There is moderate left ventricular hypertrophy. Left ventricular diastolic parameters are consistent with Grade I diastolic dysfunction (impaired relaxation).  2. Right ventricular systolic function is normal. The right ventricular size is normal. There is mildly elevated pulmonary artery systolic pressure. The estimated right ventricular systolic pressure is 123XX123 mmHg.  3. Left atrial size was mildly dilated.  4. The mitral valve is normal in structure. Mild to moderate mitral valve regurgitation. No evidence of mitral stenosis.  5. Tricuspid valve regurgitation is mild to moderate.  6. The aortic valve is normal in structure. There is moderate calcification of the aortic valve. Aortic valve regurgitation is not visualized. Moderate aortic valve stenosis. Aortic valve area, by VTI measures 1.16 cm. Aortic valve mean gradient measures 25.5 mmHg. Aortic valve Vmax measures 3.40 m/s.  7. The inferior vena cava is normal in size with greater than 50% respiratory variability, suggesting right atrial pressure of 3 mmHg. FINDINGS  Left Ventricle: Left ventricular ejection fraction, by estimation, is 60 to 65%. The left ventricle has normal function. The left ventricle has no regional wall motion abnormalities. The left ventricular internal cavity size was normal in size. There is  moderate left ventricular hypertrophy. Left ventricular diastolic parameters are consistent with Grade I diastolic  dysfunction (impaired relaxation). Right Ventricle: The right ventricular size is normal. No increase in right ventricular wall thickness. Right ventricular systolic function is normal. There is mildly elevated pulmonary artery systolic pressure. The tricuspid regurgitant velocity is 2.90  m/s, and with an assumed right atrial pressure of  3 mmHg, the estimated right ventricular systolic pressure is 123XX123 mmHg. Left Atrium: Left atrial size was mildly dilated. Right Atrium: Right atrial size was normal in size. Pericardium: There is no evidence of pericardial effusion. Mitral Valve: The mitral valve is normal in structure. Mild mitral annular calcification. Mild to moderate mitral valve regurgitation. No evidence of mitral valve stenosis. MV peak gradient, 7.6 mmHg. The mean mitral valve gradient is 3.0 mmHg. Tricuspid Valve: The tricuspid valve is normal in structure. Tricuspid valve regurgitation is mild to moderate. No evidence of tricuspid stenosis. Aortic Valve: The aortic valve is normal in structure. There is moderate calcification of the aortic valve. Aortic valve regurgitation is not visualized. Moderate aortic stenosis is present. Aortic valve mean gradient measures 25.5 mmHg. Aortic valve peak gradient measures 46.3 mmHg. Aortic valve area, by VTI measures 1.16 cm. Pulmonic Valve: The pulmonic valve was normal in structure. Pulmonic valve regurgitation is mild. No evidence of pulmonic stenosis. Aorta: The aortic root is normal in size and structure. Venous: The inferior vena cava is normal in size with greater than 50% respiratory variability, suggesting right atrial pressure of 3 mmHg. IAS/Shunts: No atrial level shunt detected by color flow Doppler.  LEFT VENTRICLE PLAX 2D LVIDd:         3.30 cm   Diastology LVIDs:         1.80 cm   LV e' medial:    7.40 cm/s LV PW:         1.20 cm   LV E/e' medial:  13.8 LV IVS:        1.30 cm   LV e' lateral:   4.13 cm/s LVOT diam:     1.90 cm   LV E/e' lateral: 24.7  LV SV:         91 LV SV Index:   56 LVOT Area:     2.84 cm  RIGHT VENTRICLE RV Basal diam:  3.75 cm RV Mid diam:    3.30 cm RV S prime:     20.30 cm/s TAPSE (M-mode): 2.5 cm LEFT ATRIUM             Index        RIGHT ATRIUM           Index LA diam:        3.50 cm 2.16 cm/m   RA Area:     16.40 cm LA Vol (A2C):   82.6 ml 50.89 ml/m  RA Volume:   44.60 ml  27.48 ml/m LA Vol (A4C):   57.5 ml 35.43 ml/m LA Biplane Vol: 71.2 ml 43.87 ml/m  AORTIC VALVE                     PULMONIC VALVE AV Area (Vmax):    1.22 cm      PV Vmax:       1.49 m/s AV Area (Vmean):   1.04 cm      PV Peak grad:  8.9 mmHg AV Area (VTI):     1.16 cm AV Vmax:           340.25 cm/s AV Vmean:          228.500 cm/s AV VTI:            0.788 m AV Peak Grad:      46.3 mmHg AV Mean Grad:      25.5 mmHg LVOT Vmax:         146.00 cm/s LVOT Vmean:  83.500 cm/s LVOT VTI:          0.322 m LVOT/AV VTI ratio: 0.41  AORTA Ao Root diam: 3.10 cm Ao Asc diam:  2.90 cm MITRAL VALVE                TRICUSPID VALVE MV Area (PHT): 2.36 cm     TR Peak grad:   33.6 mmHg MV Area VTI:   2.25 cm     TR Vmax:        290.00 cm/s MV Peak grad:  7.6 mmHg MV Mean grad:  3.0 mmHg     SHUNTS MV Vmax:       1.38 m/s     Systemic VTI:  0.32 m MV Vmean:      85.9 cm/s    Systemic Diam: 1.90 cm MV Decel Time: 321 msec MV E velocity: 102.00 cm/s MV A velocity: 121.00 cm/s MV E/A ratio:  0.84 Ida Rogue MD Electronically signed by Ida Rogue MD Signature Date/Time: 07/07/2022/6:11:17 PM    Final    DG Chest 2 View  Result Date: 07/07/2022 CLINICAL DATA:  Chest pain EXAM: CHEST - 2 VIEW COMPARISON:  CXR 08/26/20 FINDINGS: No pleural effusion. No pneumothorax. There are hazy bibasilar airspace opacities, which could represent atelectasis or infection. Unchanged cardiac and mediastinal contours. No radiographically apparent displaced rib fractures. Visualized upper abdomen unremarkable. Vertebral body heights are maintained. Aortic atherosclerotic calcifications.  IMPRESSION: Hazy bibasilar airspace opacities, which could represent atelectasis or infection. No pleural effusion. Electronically Signed   By: Marin Roberts M.D.   On: 07/07/2022 10:23        Scheduled Meds:  atorvastatin  40 mg Oral Daily   carvedilol  6.25 mg Oral BID WC   clopidogrel  75 mg Oral Daily   ferrous sulfate  325 mg Oral Daily   furosemide  20 mg Intravenous Q12H   hydrALAZINE  25 mg Oral Q8H   insulin aspart  0-5 Units Subcutaneous QHS   insulin aspart  0-9 Units Subcutaneous TID WC   isosorbide mononitrate  60 mg Oral Daily   loratadine  10 mg Oral Daily   losartan  100 mg Oral Daily   pantoprazole  40 mg Oral Daily   potassium chloride  40 mEq Oral Q2H   sertraline  100 mg Oral Daily   sodium chloride flush  3 mL Intravenous Q12H   Continuous Infusions:  sodium chloride     heparin 850 Units/hr (07/08/22 0523)     LOS: 0 days    [name], MD [facility]   If 7PM-7AM, please contact night-coverage  07/08/2022, 11:10 AM

## 2022-07-09 DIAGNOSIS — I350 Nonrheumatic aortic (valve) stenosis: Secondary | ICD-10-CM

## 2022-07-09 DIAGNOSIS — I214 Non-ST elevation (NSTEMI) myocardial infarction: Secondary | ICD-10-CM | POA: Diagnosis not present

## 2022-07-09 LAB — GLUCOSE, CAPILLARY
Glucose-Capillary: 274 mg/dL — ABNORMAL HIGH (ref 70–99)
Glucose-Capillary: 393 mg/dL — ABNORMAL HIGH (ref 70–99)
Glucose-Capillary: 242 mg/dL — ABNORMAL HIGH (ref 70–99)
Glucose-Capillary: 261 mg/dL — ABNORMAL HIGH (ref 70–99)

## 2022-07-09 LAB — BASIC METABOLIC PANEL
Anion gap: 9 (ref 5–15)
BUN: 45 mg/dL — ABNORMAL HIGH (ref 8–23)
CO2: 19 mmol/L — ABNORMAL LOW (ref 22–32)
Calcium: 9 mg/dL (ref 8.9–10.3)
Chloride: 106 mmol/L (ref 98–111)
Creatinine, Ser: 1.37 mg/dL — ABNORMAL HIGH (ref 0.44–1.00)
GFR, Estimated: 35 mL/min — ABNORMAL LOW (ref >60)
Glucose, Bld: 238 mg/dL — ABNORMAL HIGH (ref 70–99)
Potassium: 4.8 mmol/L (ref 3.5–5.1)
Sodium: 134 mmol/L — ABNORMAL LOW (ref 135–145)

## 2022-07-09 LAB — CBC
HCT: 28.4 % — ABNORMAL LOW (ref 36.0–46.0)
Hemoglobin: 9.3 g/dL — ABNORMAL LOW (ref 12.0–15.0)
MCH: 26.6 pg (ref 26.0–34.0)
MCHC: 32.7 g/dL (ref 30.0–36.0)
MCV: 81.4 fL (ref 80.0–100.0)
Platelets: 225 10*3/uL (ref 150–400)
RBC: 3.49 MIL/uL — ABNORMAL LOW (ref 3.87–5.11)
RDW: 13 % (ref 11.5–15.5)
WBC: 11.3 10*3/uL — ABNORMAL HIGH (ref 4.0–10.5)
nRBC: 0 % (ref 0.0–0.2)

## 2022-07-09 LAB — HEPARIN LEVEL (UNFRACTIONATED): Heparin Unfractionated: 0.64 IU/mL (ref 0.30–0.70)

## 2022-07-09 MED ORDER — FUROSEMIDE 10 MG/ML IJ SOLN
20.0000 mg | Freq: Two times a day (BID) | INTRAMUSCULAR | Status: DC
  Administered 2022-07-09 (×2): 20 mg via INTRAVENOUS
  Filled 2022-07-09 (×2): qty 2

## 2022-07-09 NOTE — Progress Notes (Signed)
Patient Name: Maureen Morales   Patient admitted 3/8 with complaints of shortness of breath and weakness over the last month History of coronary artery disease with a non-STEMI 4/22 in the setting of hypertensive urgency, catheterization>> medical therapy; moderate aortic stenosis and anemia Troponin 550 >> 518 started on heparin but type II secondary to mismatch -- recommended for 48 hours Echocardiogram normal LV function moderate aortic stenosis question low output severe aortic stenosis (my question) Chest x-ray infiltrates concerning for possible pneumonia Anemia chronic with acute change  IV Iron Seen by pulmonary with concerns of interstitial lung disease possibly from atypical pneumonia and inflammation and was started on IV steroids  SUBJECTIVE: Feels better today.  Coughing no chest pain  Past Medical History:  Diagnosis Date   Aortic stenosis    a. 08/2016 Echo: Hyperdynamic LV fxn, mild AS (mean grad 62mHg), mild MR, mild PAH; b. 05/2017 Echo: Hyperdynamic LV fxn, mod AS (mean grad 229mg, valve area 1.84). PASP 10059m.   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 08/2017 Renal U/S: No RAS.   Low back pain with left-sided sciatica 04/29/2018   [facility]   Non-obstructive CAD (coronary artery disease)    a. 08/2016 NSTEMI/Cath: mild to mod nonobs dzs including 60% mRCA stenosis-->Med rx.   [place]I (non-ST elevated [place] infarction) ([place]06/[place]   [place] (non-ST elevated myocardial infarction)  [place]   Nummular eczema 01/16/2011   Occurring on chest and under breast.  Treated by Dr DasEvorn Gongth fluocinonide 0.05% topical 06/26/12    Peripheral vascular disease ([place]  Periprosthetic fracture around internal prosthetic hip joint, initial encounter    PONV (postoperative nausea and vomiting)    Positive H. pylori test    Pre-diabetes    Suspected COVID-19 virus infection 01/10/2019   Patient reporting malaise and headache for the past 3 days,  Similar to presentation when she was positive for  COVID 19 INFECTION  July 2 .  Previous inection was caused by exposure to COVID positive caregiver.  Repeat scenario:  Same caregiver's mother tested positive on Monday and caregiver saw current patient on Tuesday     Viral gastroenteritis due to [place]-like agent Nov 2012    Scheduled Meds:  Scheduled Meds:  albuterol  2.5 mg Nebulization TID   atorvastatin  40 mg Oral Daily   budesonide (PULMICORT) nebulizer solution  0.5 mg Nebulization BID   carvedilol  6.25 mg Oral BID WC   clopidogrel  75 mg Oral Daily   ferrous sulfate  325 mg Oral Daily   furosemide  20 mg Intravenous BID   hydrALAZINE  25 mg Oral Q8H   insulin aspart  0-5 Units Subcutaneous QHS   insulin aspart  0-9 Units Subcutaneous TID WC   isosorbide mononitrate  60 mg Oral Daily   loratadine  10 mg Oral Daily   losartan  100 mg Oral Daily  methylPREDNISolone (SOLU-MEDROL) injection  20 mg Intravenous Q12H   pantoprazole  40 mg Oral Daily   sertraline  100 mg Oral Daily   sodium chloride flush  3 mL Intravenous Q12H   Continuous Infusions:  sodium chloride     azithromycin 500 mg (07/08/22 1440)   heparin 850 Units/hr (07/08/22 1502)   sodium chloride, acetaminophen, albuterol, nitroGLYCERIN, ondansetron (ZOFRAN) IV, sodium chloride flush    PHYSICAL EXAM Vitals:   07/09/22 0333 07/09/22 0500 07/09/22 0808 07/09/22 0816  BP: 126/79   (!) 173/68  Pulse: 71  73 79  Resp: '16  16 18  '$ Temp: (!) 97.4 F  (36.3 C)   (!) 97.4 F (36.3 C)  TempSrc: Axillary   Oral  SpO2: 99%  96% 100%  Weight:  59.2 kg    Height:       Well developed and nourished in no acute distress HENT normal Neck supple with JVP-  flat carotids delayed Clear Regular rate and rhythm, 3/6 murmur  Abd-soft with active BS No Clubbing cyanosis edema Skin-warm and dry A & Oriented  Grossly normal sensory and motor function      Intake/Output Summary (Last 24 hours) at 07/09/2022 1050 Last data filed at 07/09/2022 0300 Gross per 24 hour  Intake 198.2 ml  Output 400 ml  Net -201.8 ml     LABS: Basic Metabolic Panel: Recent Labs  Lab 07/07/22 1035 07/08/22 0358 07/09/22 0435  NA 129* 133* 134*  K 3.6 3.0* 4.8  CL 100 99 106  CO2 19* 20* 19*  GLUCOSE 204* 100* 238*  BUN 40* 37* 45*  CREATININE 1.22* 1.24* 1.37*  CALCIUM 8.9 9.0 9.0    Cardiac Enzymes: No results for input(s): "CKTOTAL", "CKMB", "CKMBINDEX", "TROPONINI" in the last 72 hours. CBC: Recent Labs  Lab 07/07/22 1035 07/08/22 0358 07/09/22 0435  WBC 12.2* 11.1* 11.3*  HGB 10.2* 9.4* 9.3*  HCT 30.7* 27.9* 28.4*  MCV 82.3 79.9* 81.4  PLT 226 223 225    PROTIME: Recent Labs    07/07/22 1135  LABPROT 15.2  INR 1.2    Liver Function Tests: Recent Labs    07/07/22 1035  AST 18  ALT 15  ALKPHOS 82  BILITOT 0.7  PROT 6.5  ALBUMIN 3.3*    No results for input(s): "LIPASE", "AMYLASE" in the last 72 hours. BNP: BNP (last 3 results) Recent Labs    07/07/22 1035  BNP 300.1*     ProBNP (last 3 results) Recent Labs    01/04/22 1042 06/30/22 1044  PROBNP 218.0* 228.0*     Anemia Panel: Recent Labs    07/07/22 1120  FERRITIN 205  TIBC 221*  IRON 29        ASSESSMENT AND PLAN:  Troponin elevation known nonobstructive coronary disease  Shortness of breath  Pneumonia?  Weakness  Aortic stenosis-moderate?  Severe low output aortic stenosis  Anemia  Hypokalemia  Suspect troponins were secondary  to demand from her anemia.   Pulmonary has seen and put on antibiotics and steroids. Troponins likely secondary in the setting of nonobstructive coronary disease. Will discontinue heparin and continue clopidogrel   Will sign off.  Call for further questions     Signed, Virl Axe MD  07/09/2022

## 2022-07-09 NOTE — Progress Notes (Signed)
OT Cancellation Note  Patient Details Name: Maureen Morales MRN: [id number] DOB: June 12, 1922   Cancelled Treatment:    Reason Eval/Treat Not Completed: OT screened, no needs identified, will sign off. Pt in recliner with two family members and her pastor at bedside. Pt endorsed just walking to the door with PT prior to OT arrival. Per patient and family, pt has 24/7 caregivers at home who assist with ADLs as needed. Pt enjoys cooking. Has all needed DME and care at home for ADL participation; declines OT needs at this time. Will sign off.   Vania Rea 07/09/2022, 3:27 PM

## 2022-07-09 NOTE — Progress Notes (Signed)
PROGRESS NOTE    Maureen Morales  [id number] DOB: November 11, 1922 DOA: 07/07/2022 PCP: [name], MD    Brief Narrative:  89 y.o. female with medical history significant of chronic diastolic dysfunction, stable coronary disease managed medically, diabetes mellitus, aortic stenosis, refractory hypertension who presents to the ED with chief complaint of progressive weakness and shortness of breath over 1 month.  Per the daughter at bedside fatigue and decreased exercise tolerance have worsened over the past 2 to 3 weeks.  This is initially attributed to iron deficiency anemia.  Patient saw her primary care physician and was started on oral iron supplementation.  On 1 AM the day of presentation she started to experience pain in the center of her chest associated with worsening shortness of breath.  Was given 1 sublingual nitroglycerin tablet with significant resolution in symptoms.  Home COVID test performed and was negative.   Patient has a history of CAD and was hospitalized in April 2022 with NSTEMI in setting of hypertensive urgency.  Underwent cardiac catheterization at that time.  No stents were placed however patient was managed medically with antiplatelet agents.   On presentation to the ED the patient does appear weak overall hemodynamically stable however.  Imaging survey significant for chest x-ray with hazy bilateral basilar airspace opacities.  High-sensitivity troponin elevated at 550, BNP minimally elevated at 300.   Assessment & Plan:   Principal Problem:   NSTEMI (non-ST elevated myocardial infarction) ([place]) Active Problems:   Essential hypertension   Hyperlipidemia   Rheumatic aortic stenosis   Normocytic anemia   CAD (coronary artery disease)   Diabetes mellitus type 2 in nonobese (HCC)   Chronic diastolic congestive heart failure (HCC)   Chest pain   Demand ischemia   Acute decompensated heart failure (HCC)  NSTEMI History of CAD Stable angina Chest pain Initial  sensitivity troponin 550.  Unclear whether this represents true ACS versus supply/demand ischemia.  Considering history of CAD will treat as NSTEMI. Plan: Continue heparin GTT x 48 hours, will expire today Continue home Plavix Continue beta-blocker and ACE inhibitor Continue long-acting nitrate SL nitroglycerin as needed chest pain Troponin peaked at 550, no indication to trend at this point Stat EKG as needed chest pain Telemetry monitoring Cardiology consult Currently no plans for ischemic evaluation    Suspected acute on chronic diastolic congestive heart failure Patient does have some signs of fluid overload including some hazy opacities on the chest felt to be due to interstitial edema.  Interestingly BNP only slightly elevated Plan: Continue IV Lasix 20 mg twice daily Beta-blocker and ACE inhibitor as above Strict ins and outs, daily weights Reassess tomorrow for diuretic need and tolerance  Weakness Shortness of breath I suspect that weakness and shortness of breath were likely more driven by pulmonary issues and cardiac.  CT chest with contrast performed yesterday demonstrates rather significant evidence of interstitial edema unable to rule out atypical infection.  Pulmonary consult requested.  Patient initiated on intravenous steroids, continued on diuretics.  Azithromycin added for atypical coverage.  RVP negative.  Strep pneumo and Legionella urinary antigens are currently pending.  Will continue current plan of care for now.  Possible discharge Monday 3/11   Refractory hypertension Patient is on aggressive dosing regimen of several agents.  Will continue while hospitalized.  As needed hydralazine for SBP greater than 165   Iron deficiency anemia Patient was presenting diagnosed with IDA and placed on oral iron supplementation.  Received IV Venofer 300 mg x 1  on 3/8.  Continue home oral iron replacement 325 mg daily      AKI on chronic kidney disease stage IIIa Creatinine  slightly above baseline.  Possible due to cardiorenal syndrome.  Patient did receive contrast bolus 3/9.  Monitor creatinine carefully.     Type 2 diabetes mellitus with hyperglycemia Patient on oral agents at home.  Will hold.  Sensitive sliding scale, carb modified diet, Accu-Cheks before meals and at bedtime   Hyponatremia Felt to be secondary to fluid overload noted state/hypervolemic hyponatremia.  Diuresis as above.  Reassess  Hyperlipidemia PTA statin     Depression PTA Zoloft   Goals of care Discussed CODE STATUS at length with patient and family are at bedside.  Patient confirms she is a DNR.  She was DNR on previous admissions.   DVT prophylaxis: Heparin GTT Code Status: DNR Family Communication: Caregiver and family member at bedside 3/9, 3/10 Disposition Plan: Status is: Inpatient Remains inpatient appropriate because: Fluid overload, weakness, shortness of breath.  Improving.  Possible discharge in 24 to 48 hours.     Level of care: Telemetry Cardiac  Consultants:  Cardiology Pulmonary  Procedures:  None  Antimicrobials: None   Subjective: Seen and examined.  Appears improved this morning.  Sitting comfortably, no conversational dyspnea noted.  Objective: Vitals:   07/09/22 0333 07/09/22 0500 07/09/22 0808 07/09/22 0816  BP: 126/79   (!) 173/68  Pulse: 71  73 79  Resp: '16  16 18  '$ Temp: (!) 97.4 F (36.3 C)   (!) 97.4 F (36.3 C)  TempSrc: Axillary   Oral  SpO2: 99%  96% 100%  Weight:  59.2 kg    Height:        Intake/Output Summary (Last 24 hours) at 07/09/2022 1054 Last data filed at 07/09/2022 0300 Gross per 24 hour  Intake 198.2 ml  Output 400 ml  Net -201.8 ml   Filed Weights   07/07/22 0931 07/08/22 0500 07/09/22 0500  Weight: 63.5 kg 59.4 kg 59.2 kg    Examination:  General exam: No acute distress.  Energy level improved Respiratory system: Bibasilar rales.  Normal work of breathing.  Room air Cardiovascular system: S1-S2,  RRR, no murmurs, trace pedal edema Gastrointestinal system: Thin, soft, NT/ND, normal bowel sounds Central nervous system: Alert and oriented. No focal neurological deficits. Extremities: Symmetric 5 x 5 power. Skin: No rashes, lesions or ulcers Psychiatry: Judgement and insight appear normal. Mood & affect appropriate.     Data Reviewed: I have personally reviewed following labs and imaging studies  CBC: Recent Labs  Lab 07/07/22 1035 07/08/22 0358 07/09/22 0435  WBC 12.2* 11.1* 11.3*  HGB 10.2* 9.4* 9.3*  HCT 30.7* 27.9* 28.4*  MCV 82.3 79.9* 81.4  PLT 226 223 [id number]   Basic Metabolic Panel: Recent Labs  Lab 07/07/22 1035 07/08/22 0358 07/09/22 0435  NA 129* 133* 134*  K 3.6 3.0* 4.8  CL 100 99 106  CO2 19* 20* 19*  GLUCOSE 204* 100* 238*  BUN 40* 37* 45*  CREATININE 1.22* 1.24* 1.37*  CALCIUM 8.9 9.0 9.0   GFR: Estimated Creatinine Clearance: 19.4 mL/min (A) (by C-G formula based on SCr of 1.37 mg/dL (H)). Liver Function Tests: Recent Labs  Lab 07/07/22 1035  AST 18  ALT 15  ALKPHOS 82  BILITOT 0.7  PROT 6.5  ALBUMIN 3.3*   No results for input(s): "LIPASE", "AMYLASE" in the last 168 hours. No results for input(s): "AMMONIA" in the last 168 hours. Coagulation Profile: Recent  Labs  Lab 07/07/22 1135  INR 1.2   Cardiac Enzymes: No results for input(s): "CKTOTAL", "CKMB", "CKMBINDEX", "TROPONINI" in the last 168 hours. BNP (last 3 results) Recent Labs    01/04/22 1042 06/30/22 1044  PROBNP 218.0* 228.0*   HbA1C: No results for input(s): "HGBA1C" in the last 72 hours. CBG: Recent Labs  Lab 07/08/22 0809 07/08/22 1300 07/08/22 1634 07/08/22 2131 07/09/22 0819  GLUCAP 151* 141* 169* 312* 274*   Lipid Profile: No results for input(s): "CHOL", "HDL", "LDLCALC", "TRIG", "CHOLHDL", "LDLDIRECT" in the last 72 hours. Thyroid Function Tests: No results for input(s): "TSH", "T4TOTAL", "FREET4", "T3FREE", "THYROIDAB" in the last 72 hours. Anemia  Panel: Recent Labs    07/07/22 1120  FERRITIN 205  TIBC 221*  IRON 29   Sepsis Labs: Recent Labs  Lab 07/08/22 0353  PROCALCITON <0.10    Recent Results (from the past 240 hour(s))  Resp panel by RT-PCR (RSV, Flu A&B, Covid) Anterior Nasal Swab     Status: None   Collection Time: 07/07/22 10:02 AM   Specimen: Anterior Nasal Swab  Result Value Ref Range Status   SARS Coronavirus 2 by RT PCR NEGATIVE NEGATIVE Final    Comment: (NOTE) SARS-CoV-2 target nucleic acids are NOT DETECTED.  The SARS-CoV-2 RNA is generally detectable in upper respiratory specimens during the acute phase of infection. The lowest concentration of SARS-CoV-2 viral copies this assay can detect is 138 copies/mL. A negative result does not preclude SARS-Cov-2 infection and should not be used as the sole basis for treatment or other patient management decisions. A negative result may occur with  improper specimen collection/handling, submission of specimen other than nasopharyngeal swab, presence of viral mutation(s) within the areas targeted by this assay, and inadequate number of viral copies(<138 copies/mL). A negative result must be combined with clinical observations, patient history, and epidemiological information. The expected result is Negative.  Fact Sheet for Patients:  EntrepreneurPulse.com.au  Fact Sheet for Healthcare Providers:  IncredibleEmployment.be  This test is no t yet approved or cleared by the [place] FDA and  has been authorized for detection and/or diagnosis of SARS-CoV-2 by FDA under an Emergency Use Authorization (EUA). This EUA will remain  in effect (meaning this test can be used) for the duration of the COVID-19 declaration under Section 564(b)(1) of the Act, 21 U.S.C.section 360bbb-3(b)(1), unless the authorization is terminated  or revoked sooner.       Influenza A by PCR NEGATIVE NEGATIVE Final   Influenza B by PCR NEGATIVE  NEGATIVE Final    Comment: (NOTE) The Xpert Xpress SARS-CoV-2/FLU/RSV plus assay is intended as an aid in the diagnosis of influenza from Nasopharyngeal swab specimens and should not be used as a sole basis for treatment. Nasal washings and aspirates are unacceptable for Xpert Xpress SARS-CoV-2/FLU/RSV testing.  Fact Sheet for Patients: EntrepreneurPulse.com.au  Fact Sheet for Healthcare Providers: IncredibleEmployment.be  This test is not yet approved or cleared by the [place] FDA and has been authorized for detection and/or diagnosis of SARS-CoV-2 by FDA under an Emergency Use Authorization (EUA). This EUA will remain in effect (meaning this test can be used) for the duration of the COVID-19 declaration under Section 564(b)(1) of the Act, 21 U.S.C. section 360bbb-3(b)(1), unless the authorization is terminated or revoked.     Resp Syncytial Virus by PCR NEGATIVE NEGATIVE Final    Comment: (NOTE) Fact Sheet for Patients: EntrepreneurPulse.com.au  Fact Sheet for Healthcare Providers: IncredibleEmployment.be  This test is not yet approved or cleared  by the [place] and has been authorized for detection and/or diagnosis of SARS-CoV-2 by FDA under an Emergency Use Authorization (EUA). This EUA will remain in effect (meaning this test can be used) for the duration of the COVID-19 declaration under Section 564(b)(1) of the Act, 21 U.S.C. section 360bbb-3(b)(1), unless the authorization is terminated or revoked.  Performed at [facility], [address] [place], [place] [address]          Radiology Studies: CT Angio Chest Pulmonary Embolism (PE) W or WO Contrast  Result Date: 07/08/2022 CLINICAL DATA:  Progressive shortness of breath.  PE suspected. EXAM: CT ANGIOGRAPHY CHEST WITH CONTRAST TECHNIQUE: Multidetector CT imaging of the chest was performed using the standard  protocol during bolus administration of intravenous contrast. Multiplanar CT image reconstructions and MIPs were obtained to evaluate the vascular anatomy. RADIATION DOSE REDUCTION: This exam was performed according to the departmental dose-optimization program which includes automated exposure control, adjustment of the mA and/or kV according to patient size and/or use of iterative reconstruction technique. CONTRAST:  29m OMNIPAQUE IOHEXOL 350 MG/ML SOLN COMPARISON:  None Available. FINDINGS: Cardiovascular: Heart size upper normal. No substantial pericardial effusion. Coronary artery calcification is evident. Moderate atherosclerotic calcification is noted in the wall of the thoracic aorta. There is no filling defect within the opacified pulmonary arteries to suggest the presence of an acute pulmonary embolus. Mediastinum/Nodes: Upper normal to borderline enlarged mediastinal nodes evident including 11 mm short axis right paratracheal node on 27/4, 11 mm precarinal node on 43/4, and 12 mm short axis subcarinal node on 51/4. There is no hilar lymphadenopathy. The esophagus has normal imaging features. There is no axillary lymphadenopathy. Lungs/Pleura: Diffuse interstitial thickening noted in both lungs with patchy areas of ground-glass opacity bilaterally. Areas of interlobular septal thickening evident. No overtly suspicious pulmonary nodule or mass. Insert no pleural effusion Upper Abdomen: Unremarkable. Musculoskeletal: No worrisome lytic or sclerotic osseous abnormality. Review of the MIP images confirms the above findings. IMPRESSION: 1. No CT evidence for acute pulmonary embolus. 2. Diffuse interstitial thickening in both lungs with patchy areas of ground-glass opacity bilaterally. Imaging features are nonspecific and may be related to edema or atypical infection. 3. Upper normal to borderline enlarged mediastinal nodes, likely reactive. Follow-up CT chest in 3 months recommended to ensure resolution. 4.   Aortic Atherosclerosis (ICD10-I70.0). Electronically Signed   By: EMisty StanleyM.D.   On: 07/08/2022 11:17   ECHOCARDIOGRAM COMPLETE  Result Date: 07/07/2022    ECHOCARDIOGRAM REPORT   Patient Name:   Maureen RENTIEDate of Exam: 07/07/2022 Medical Rec #:  [id number]    Height:       61.0 in Accession #:    [id number]   Weight:       140.0 lb Date of Birth:  121-Jul-1924   BSA:          1.623 m Patient Age:    89years      BP:           160/63 mmHg Patient Gender: F             HR:           76 bpm. Exam Location:  [facility] Procedure: 2D Echo, Cardiac Doppler and Color Doppler Indications:     CHF  History:         Patient has prior history of Echocardiogram examinations, most  recent 08/19/2020. CHF, CAD, Acute MI and Previous Myocardial                  Infarction; Risk Factors:Diabetes, Hypertension and                  Dyslipidemia.  Sonographer:     Wenda Low Referring Phys:  [id number] [name] Diagnosing Phys: Ida Rogue MD IMPRESSIONS  1. Left ventricular ejection fraction, by estimation, is 60 to 65%. The left ventricle has normal function. The left ventricle has no regional wall motion abnormalities. There is moderate left ventricular hypertrophy. Left ventricular diastolic parameters are consistent with Grade I diastolic dysfunction (impaired relaxation).  2. Right ventricular systolic function is normal. The right ventricular size is normal. There is mildly elevated pulmonary artery systolic pressure. The estimated right ventricular systolic pressure is 123XX123 mmHg.  3. Left atrial size was mildly dilated.  4. The mitral valve is normal in structure. Mild to moderate mitral valve regurgitation. No evidence of mitral stenosis.  5. Tricuspid valve regurgitation is mild to moderate.  6. The aortic valve is normal in structure. There is moderate calcification of the aortic valve. Aortic valve regurgitation is not visualized. Moderate aortic valve stenosis. Aortic valve area, by  VTI measures 1.16 cm. Aortic valve mean gradient measures 25.5 mmHg. Aortic valve Vmax measures 3.40 m/s.  7. The inferior vena cava is normal in size with greater than 50% respiratory variability, suggesting right atrial pressure of 3 mmHg. FINDINGS  Left Ventricle: Left ventricular ejection fraction, by estimation, is 60 to 65%. The left ventricle has normal function. The left ventricle has no regional wall motion abnormalities. The left ventricular internal cavity size was normal in size. There is  moderate left ventricular hypertrophy. Left ventricular diastolic parameters are consistent with Grade I diastolic dysfunction (impaired relaxation). Right Ventricle: The right ventricular size is normal. No increase in right ventricular wall thickness. Right ventricular systolic function is normal. There is mildly elevated pulmonary artery systolic pressure. The tricuspid regurgitant velocity is 2.90  m/s, and with an assumed right atrial pressure of 3 mmHg, the estimated right ventricular systolic pressure is 123XX123 mmHg. Left Atrium: Left atrial size was mildly dilated. Right Atrium: Right atrial size was normal in size. Pericardium: There is no evidence of pericardial effusion. Mitral Valve: The mitral valve is normal in structure. Mild mitral annular calcification. Mild to moderate mitral valve regurgitation. No evidence of mitral valve stenosis. MV peak gradient, 7.6 mmHg. The mean mitral valve gradient is 3.0 mmHg. Tricuspid Valve: The tricuspid valve is normal in structure. Tricuspid valve regurgitation is mild to moderate. No evidence of tricuspid stenosis. Aortic Valve: The aortic valve is normal in structure. There is moderate calcification of the aortic valve. Aortic valve regurgitation is not visualized. Moderate aortic stenosis is present. Aortic valve mean gradient measures 25.5 mmHg. Aortic valve peak gradient measures 46.3 mmHg. Aortic valve area, by VTI measures 1.16 cm. Pulmonic Valve: The pulmonic  valve was normal in structure. Pulmonic valve regurgitation is mild. No evidence of pulmonic stenosis. Aorta: The aortic root is normal in size and structure. Venous: The inferior vena cava is normal in size with greater than 50% respiratory variability, suggesting right atrial pressure of 3 mmHg. IAS/Shunts: No atrial level shunt detected by color flow Doppler.  LEFT VENTRICLE PLAX 2D LVIDd:         3.30 cm   Diastology LVIDs:         1.80 cm   LV  e' medial:    7.40 cm/s LV PW:         1.20 cm   LV E/e' medial:  13.8 LV IVS:        1.30 cm   LV e' lateral:   4.13 cm/s LVOT diam:     1.90 cm   LV E/e' lateral: 24.7 LV SV:         91 LV SV Index:   56 LVOT Area:     2.84 cm  RIGHT VENTRICLE RV Basal diam:  3.75 cm RV Mid diam:    3.30 cm RV S prime:     20.30 cm/s TAPSE (M-mode): 2.5 cm LEFT ATRIUM             Index        RIGHT ATRIUM           Index LA diam:        3.50 cm 2.16 cm/m   RA Area:     16.40 cm LA Vol (A2C):   82.6 ml 50.89 ml/m  RA Volume:   44.60 ml  27.48 ml/m LA Vol (A4C):   57.5 ml 35.43 ml/m LA Biplane Vol: 71.2 ml 43.87 ml/m  AORTIC VALVE                     PULMONIC VALVE AV Area (Vmax):    1.22 cm      PV Vmax:       1.49 m/s AV Area (Vmean):   1.04 cm      PV Peak grad:  8.9 mmHg AV Area (VTI):     1.16 cm AV Vmax:           340.25 cm/s AV Vmean:          228.500 cm/s AV VTI:            0.788 m AV Peak Grad:      46.3 mmHg AV Mean Grad:      25.5 mmHg LVOT Vmax:         146.00 cm/s LVOT Vmean:        83.500 cm/s LVOT VTI:          0.322 m LVOT/AV VTI ratio: 0.41  AORTA Ao Root diam: 3.10 cm Ao Asc diam:  2.90 cm MITRAL VALVE                TRICUSPID VALVE MV Area (PHT): 2.36 cm     TR Peak grad:   33.6 mmHg MV Area VTI:   2.25 cm     TR Vmax:        290.00 cm/s MV Peak grad:  7.6 mmHg MV Mean grad:  3.0 mmHg     SHUNTS MV Vmax:       1.38 m/s     Systemic VTI:  0.32 m MV Vmean:      85.9 cm/s    Systemic Diam: 1.90 cm MV Decel Time: 321 msec MV E velocity: 102.00 cm/s MV A  velocity: 121.00 cm/s MV E/A ratio:  0.84 Ida Rogue MD Electronically signed by Ida Rogue MD Signature Date/Time: 07/07/2022/6:11:17 PM    Final         Scheduled Meds:  albuterol  2.5 mg Nebulization TID   atorvastatin  40 mg Oral Daily   budesonide (PULMICORT) nebulizer solution  0.5 mg Nebulization BID   carvedilol  6.25 mg Oral BID WC   clopidogrel  75 mg Oral Daily  ferrous sulfate  325 mg Oral Daily   furosemide  20 mg Intravenous BID   hydrALAZINE  25 mg Oral Q8H   insulin aspart  0-5 Units Subcutaneous QHS   insulin aspart  0-9 Units Subcutaneous TID WC   isosorbide mononitrate  60 mg Oral Daily   loratadine  10 mg Oral Daily   losartan  100 mg Oral Daily   methylPREDNISolone (SOLU-MEDROL) injection  20 mg Intravenous Q12H   pantoprazole  40 mg Oral Daily   sertraline  100 mg Oral Daily   sodium chloride flush  3 mL Intravenous Q12H   Continuous Infusions:  sodium chloride     azithromycin 500 mg (07/08/22 1440)   heparin 850 Units/hr (07/08/22 1502)     LOS: 1 day    [name], MD [facility]   If 7PM-7AM, please contact night-coverage  07/09/2022, 10:54 AM

## 2022-07-09 NOTE — Evaluation (Signed)
Physical Therapy Evaluation Patient Details Name: Maureen Morales MRN: [id number] DOB: Jan 23, 1923 Today's Date: 07/09/2022  History of Present Illness  Pt is a 89 y.o. female presenting to hospital 07/08/22 with c/o chest pain and SOB; recent fatigue and generalized weakness.  Pt admitted with NSTEMI, suspected acute on chronic diastolic CHF, and iron deficiency anemia.  PMH includes DM, gastric reflux, htn, HLD, CAD, bladder sx, R hip arthroplasty 2018, R ORIF periprosthetic fx 2019.  Clinical Impression  Prior to hospital admission, pt used w/c for mobility (transferred with 1 assist and used 2 assist and RW to walk short distances); has 24/7 caregiver assist and family support; lives on main level of home with ramp to enter.  Currently pt is CGA to min assist with transfers and to ambulate 16 feet with RW use (limited distance d/t pt fatigue/generalized weakness).  Pt reporting no pain during session's activities (pt and family report pt often has R hip pain/discomfort--h/o 2 hip surgeries).  Pt would currently benefit from skilled PT to address noted impairments and functional limitations (see below for any additional details).  Upon hospital discharge, pt would benefit from ongoing therapy.    Recommendations for follow up therapy are one component of a multi-disciplinary discharge planning process, led by the attending physician.  Recommendations may be updated based on patient status, additional functional criteria and insurance authorization.  Follow Up Recommendations Home health PT      Assistance Recommended at Discharge Frequent or constant Supervision/Assistance  Patient can return home with the following  A little help with walking and/or transfers;A little help with bathing/dressing/bathroom;Assistance with cooking/housework;Assist for transportation;Help with stairs or ramp for entrance    Equipment Recommendations Other (comment) (pt has needed DME at home already)  Recommendations  for Other Services       Functional Status Assessment Patient has had a recent decline in their functional status and demonstrates the ability to make significant improvements in function in a reasonable and predictable amount of time.     Precautions / Restrictions Precautions Precautions: Fall Restrictions Weight Bearing Restrictions: No      Mobility  Bed Mobility               General bed mobility comments: Deferred (pt sitting in recliner beginning/end of session)    Transfers Overall transfer level: Needs assistance Equipment used: Rolling walker (2 wheels) Transfers: Sit to/from Stand Sit to Stand: Min guard, Min assist           General transfer comment: vc's for UE placement; increased effort to stand    Ambulation/Gait Ambulation/Gait assistance: Min guard, Min assist Gait Distance (Feet): 16 Feet Assistive device: Rolling walker (2 wheels)   Gait velocity: decreased     General Gait Details: mild decreased stance time R LE (pt reporting no pain but has h/o hip surgeries); partial step through gait pattern  Stairs            Wheelchair Mobility    Modified Rankin (Stroke Patients Only)       Balance Overall balance assessment: Needs assistance Sitting-balance support: No upper extremity supported, Feet supported Sitting balance-Leahy Scale: Good Sitting balance - Comments: steady sitting reaching within BOS   Standing balance support: Bilateral upper extremity supported, Reliant on assistive device for balance Standing balance-Leahy Scale: Fair Standing balance comment: steady static standing with B UE support on RW  Pertinent Vitals/Pain Pain Assessment Pain Assessment: No/denies pain Vitals (HR and O2 on room air) stable and WFL throughout treatment session.    Home Living Family/patient expects to be discharged to:: Private residence Living Arrangements: Other (Comment) (Caregiver stays  with pt 24/7; daughter lives close by) Available Help at Discharge: Personal care attendant;Family;Available 24 hours/day Type of Home: House Home Access: Ramped entrance       Home Layout: Two level;Able to live on main level with bedroom/bathroom Home Equipment: Rolling Walker (2 wheels);Wheelchair - manual      Prior Function Prior Level of Function : Needs assist             Mobility Comments: Pt has 1 assist for transfers (no AD use) and 2 assist for walking with RW use (short distances).  Pt typically uses manual w/c for mobility.       Hand Dominance        Extremity/Trunk Assessment   Upper Extremity Assessment Upper Extremity Assessment: Overall WFL for tasks assessed    Lower Extremity Assessment Lower Extremity Assessment: Generalized weakness       Communication   Communication: HOH  Cognition Arousal/Alertness: Awake/alert Behavior During Therapy: WFL for tasks assessed/performed Overall Cognitive Status: Within Functional Limits for tasks assessed                                          General Comments  Nursing cleared pt for participation in physical therapy.  Pt agreeable to PT session.  Pt's daughter and granddaughter present most of session.    Exercises     Assessment/Plan    PT Assessment Patient needs continued PT services  PT Problem List Decreased strength;Decreased activity tolerance;Decreased balance;Decreased mobility       PT Treatment Interventions DME instruction;Gait training;Functional mobility training;Therapeutic activities;Therapeutic exercise;Balance training;Patient/family education    PT Goals (Current goals can be found in the Care Plan section)  Acute Rehab PT Goals Patient Stated Goal: to go home PT Goal Formulation: With patient/family Time For Goal Achievement: 07/23/22 Potential to Achieve Goals: Good    Frequency Min 2X/week     Co-evaluation               AM-PAC PT "6  Clicks" Mobility  Outcome Measure Help needed turning from your back to your side while in a flat bed without using bedrails?: None Help needed moving from lying on your back to sitting on the side of a flat bed without using bedrails?: A Little Help needed moving to and from a bed to a chair (including a wheelchair)?: A Little Help needed standing up from a chair using your arms (e.g., wheelchair or bedside chair)?: A Little Help needed to walk in hospital room?: A Little Help needed climbing 3-5 steps with a railing? : A Lot 6 Click Score: 18    End of Session Equipment Utilized During Treatment: Gait belt Activity Tolerance: Patient limited by fatigue Patient left: in chair;with call bell/phone within reach;with family/visitor present Nurse Communication: Mobility status;Precautions;Other (comment) (nurse cleared pt to not have chair alarm (family providing 24/7 supervision during hospitalization)) PT Visit Diagnosis: Muscle weakness (generalized) (M62.81);Other abnormalities of gait and mobility (R26.89)    Time: [id number] PT Time Calculation (min) (ACUTE ONLY): 35 min   Charges:   PT Evaluation $PT Eval Low Complexity: 1 Low PT Treatments $Therapeutic Activity: 8-22 mins  Leitha Bleak, PT 07/09/22, 5:52 PM

## 2022-07-09 NOTE — Consult Note (Signed)
[facility] for heparin drip Indication: chest pain/ACS  Allergies  Allergen Reactions   Clindamycin     dizziness   Oxytetracycline Hives   Phenobarbital Hives   Atropine Other (See Comments)    Reaction: unknown   Belladonna Alkaloids Other (See Comments)    Reaction: unknown   Codeine Nausea And Vomiting   Darvon [Propoxyphene] Nausea And Vomiting   Demerol [Meperidine] Nausea And Vomiting   Methocarbamol Other (See Comments)    Reaction: unknown   Penicillins Other (See Comments)    Pt and family unsure of details.  Can tolerate cephalosporins   Sulfa Antibiotics Other (See Comments)    Reaction: unknown   Iron Other (See Comments)    High dose prescription gives her diarrhea    Patient Measurements: Height: '5\' 1"'$  (154.9 cm) Weight: 59.4 kg (130 lb 15.3 oz) IBW/kg (Calculated) : 47.8 Heparin Dosing Weight: 60.9 kg  Vital Signs: Temp: 97.4 F (36.3 C) (03/10 0333) Temp Source: Axillary (03/10 0333) BP: 126/79 (03/10 0333) Pulse Rate: 71 (03/10 0333)  Labs: Recent Labs    07/07/22 1035 07/07/22 1120 07/07/22 1135 07/07/22 1945 07/08/22 0358 07/08/22 1356 07/08/22 2207 07/09/22 0435  HGB 10.2*  --   --   --  9.4*  --   --  9.3*  HCT 30.7*  --   --   --  27.9*  --   --  28.4*  PLT 226  --   --   --  223  --   --  225  APTT  --   --  38*  --   --   --   --   --   LABPROT  --   --  15.2  --   --   --   --   --   INR  --   --  1.2  --   --   --   --   --   HEPARINUNFRC  --   --   --    < > 0.29* 0.42 0.55 0.64  CREATININE 1.22*  --   --   --  1.24*  --   --  1.37*  TROPONINIHS 550* 518*  --   --   --   --   --   --    < > = values in this interval not displayed.     Estimated Creatinine Clearance: 19.4 mL/min (A) (by C-G formula based on SCr of 1.37 mg/dL (H)).   Medical History: Past Medical History:  Diagnosis Date   Aortic stenosis    a. 08/2016 Echo: Hyperdynamic LV fxn, mild AS (mean grad 61mHg), mild MR,  mild PAH; b. 05/2017 Echo: Hyperdynamic LV fxn, mod AS (mean grad 251mg, valve area 1.84). PASP 10036m.   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 08/2017 Renal U/S: No RAS.   Low back pain with left-sided sciatica 04/29/2018   Myocardial  infarction ([place]) 2018   Non-obstructive CAD (coronary artery disease)    a. 08/2016 NSTEMI/Cath: mild to mod nonobs dzs including 60% mRCA stenosis-->Med rx.   NSTEMI (non-ST elevated myocardial infarction) ([place]) 08/30/2016   NSTEMI (non-ST elevated myocardial infarction) ([place]) 08/18/2020   Nummular eczema 01/16/2011   Occurring on chest and under breast.  Treated by Dr Evorn Gong with fluocinonide 0.05% topical 06/26/12    Peripheral vascular disease ([place])    Periprosthetic fracture around internal prosthetic hip joint, initial encounter    PONV (postoperative nausea and vomiting)    Positive H. pylori test    Pre-diabetes    Suspected COVID-19 virus infection 01/10/2019   Patient reporting malaise and headache for the past 3 days,  Similar to presentation when she was positive for  COVID 19 INFECTION  July 2 .  Previous inection was caused by exposure to COVID positive caregiver.  Repeat scenario:  Same caregiver's mother tested positive on Monday and caregiver saw current patient on Tuesday     Viral gastroenteritis due to [place]-like agent Nov 2012    Medications:  No home anticoagulation per pharmacist review  Assessment: 89 yo female presented to ED with chest pain.  PMH includes HTN, HLD, and CAD.  Patient  found to have elevated troponin so pharmacy consulted to initiate heparin infusion.  Date Time HL Rate/Comment 3/8 1945 0.35 Therapeutic x1 / 700 u/hr 3/9 0358 0.29 Subtherapeutic 3/9 1356 0.42 Therap x 1 3/9 2207 0.55 Therapeutic x 2 3/10 0435 0.64 Therapeutic x 3  Goal of Therapy:  Heparin level 0.3-0.7 units/ml Monitor platelets by anticoagulation protocol: Yes   Plan:  Continue heparin infusion at 850 units/hr Heparin drip scheduled to stop at 1244 today  Renda Rolls, PharmD, [facility] 07/09/2022 5:17 AM

## 2022-07-10 ENCOUNTER — Telehealth: Payer: Self-pay | Admitting: Internal Medicine

## 2022-07-10 DIAGNOSIS — I214 Non-ST elevation (NSTEMI) myocardial infarction: Secondary | ICD-10-CM | POA: Diagnosis not present

## 2022-07-10 LAB — BASIC METABOLIC PANEL
Anion gap: 9 (ref 5–15)
BUN: 64 mg/dL — ABNORMAL HIGH (ref 8–23)
CO2: 18 mmol/L — ABNORMAL LOW (ref 22–32)
Calcium: 8.9 mg/dL (ref 8.9–10.3)
Chloride: 105 mmol/L (ref 98–111)
Creatinine, Ser: 1.87 mg/dL — ABNORMAL HIGH (ref 0.44–1.00)
GFR, Estimated: 24 mL/min — ABNORMAL LOW (ref >60)
Glucose, Bld: 261 mg/dL — ABNORMAL HIGH (ref 70–99)
Potassium: 4.6 mmol/L (ref 3.5–5.1)
Sodium: 132 mmol/L — ABNORMAL LOW (ref 135–145)

## 2022-07-10 LAB — GLUCOSE, CAPILLARY: Glucose-Capillary: 316 mg/dL — ABNORMAL HIGH (ref 70–99)

## 2022-07-10 LAB — STREP PNEUMONIAE URINARY ANTIGEN: Strep Pneumo Urinary Antigen: NEGATIVE

## 2022-07-10 LAB — MYCOPLASMA PNEUMONIAE ANTIBODY, IGM: Mycoplasma pneumo IgM: <770 U/mL (ref 0–769)

## 2022-07-10 MED ORDER — AZITHROMYCIN 250 MG PO TABS
500.0000 mg | ORAL_TABLET | Freq: Once | ORAL | Status: AC
  Administered 2022-07-10: 500 mg via ORAL
  Filled 2022-07-10: qty 2

## 2022-07-10 MED ORDER — PREDNISONE 10 MG PO TABS: 20.0000 mg | ORAL_TABLET | Freq: Every day | ORAL | 0 refills | Status: AC

## 2022-07-10 MED ORDER — ALBUTEROL SULFATE (2.5 MG/3ML) 0.083% IN NEBU: 2.5000 mg | INHALATION_SOLUTION | Freq: Two times a day (BID) | RESPIRATORY_TRACT | Status: DC

## 2022-07-10 NOTE — TOC Transition Note (Signed)
Transition of Care [facility]) - CM/SW Discharge Note   Patient Details  Name: Maureen Morales MRN: [id number] Date of Birth: Jan 17, 1923  Transition of Care [facility]) CM/SW Contact:  Laurena Slimmer, RN Phone Number: 07/10/2022, 12:39 PM   Clinical Narrative:    Spoke with patient regarding therapy recommendation. She is agreeable to [facility] and does not have a preference of [place] agency. She is agreeable to a hospital preferred provider referral being made by TOC.   Referral sent and accepted by Floydene Flock from [facility].   TOC signing off.          Patient Goals and CMS Choice      Discharge Placement                         Discharge Plan and Services Additional resources added to the After Visit Summary for                                       Social Determinants of Health (SDOH) Interventions SDOH Screenings   Depression (PHQ2-9): Low Risk  (01/19/2019)  Tobacco Use: Low Risk  (07/07/2022)     Readmission Risk Interventions     No data to display

## 2022-07-10 NOTE — Telephone Encounter (Signed)
Caryl Pina a nurse from [facility] called in staying that pt its been discharged today from the hospital, however, pt needs a F/U w/ provider, I booked her for 07/13/22 '@11am'$ .

## 2022-07-10 NOTE — Discharge Summary (Signed)
Physician Discharge Summary  Maureen Morales [id number] DOB: 1922-05-21 DOA: 07/07/2022  PCP: [name], MD  Admit date: 07/07/2022 Discharge date: 07/10/2022  Admitted From: Home Disposition:  Home with [facility]  Recommendations for Outpatient Follow-up:  Follow up with PCP in 1-2 weeks Follow-up cardiology 1 week Follow-up pulmonology 2 to 3 weeks  Home Health: Yes PT OT Equipment/Devices: None  Discharge Condition: Stable CODE STATUS: DNR Diet recommendation: Regular  Brief/Interim Summary:  89 y.o. female with medical history significant of chronic diastolic dysfunction, stable coronary disease managed medically, diabetes mellitus, aortic stenosis, refractory hypertension who presents to the ED with chief complaint of progressive weakness and shortness of breath over 1 month.  Per the daughter at bedside fatigue and decreased exercise tolerance have worsened over the past 2 to 3 weeks.  This is initially attributed to iron deficiency anemia.  Patient saw her primary care physician and was started on oral iron supplementation.  On 1 AM the day of presentation she started to experience pain in the center of her chest associated with worsening shortness of breath.  Was given 1 sublingual nitroglycerin tablet with significant resolution in symptoms.  Home COVID test performed and was negative.   Patient has a history of CAD and was hospitalized in April 2022 with NSTEMI in setting of hypertensive urgency.  Underwent cardiac catheterization at that time.  No stents were placed however patient was managed medically with antiplatelet agents.   On presentation to the ED the patient does appear weak overall hemodynamically stable however.  Imaging survey significant for chest x-ray with hazy bilateral basilar airspace opacities.  High-sensitivity troponin elevated at 550, BNP minimally elevated at 300.   Discharge Diagnoses:  Principal Problem:   NSTEMI (non-ST elevated myocardial  infarction) [facility]) Active Problems:   Essential hypertension   Hyperlipidemia   Rheumatic aortic stenosis   Normocytic anemia   CAD (coronary artery disease)   Diabetes mellitus type 2 in nonobese (HCC)   Chronic diastolic congestive heart failure (HCC)   Chest pain   Demand ischemia   Acute decompensated heart failure (HCC)  NSTEMI History of CAD Stable angina Chest pain Initial sensitivity troponin 550.  Unclear whether this represents true ACS versus supply/demand ischemia.  Considering history of CAD will treat as NSTEMI. Plan: Patient completed 48 hours of heparin gtt.  No plans for ischemic evaluation during this admission.  Patient can resume goal-directed medical therapy with Plavix, beta-blocker, ACE inhibitor, long-acting nitrates.  Discussed with cardiology.  Patient will follow-up in their office within 1 week of discharge.  Suspected acute on chronic diastolic congestive heart failure Patient does have some signs of fluid overload including some hazy opacities on the chest felt to be due to interstitial edema.  Interestingly BNP only slightly elevated Plan: Patient received 3 days of intravenous diuresis in house.  Slight reduction in kidney function.  As such we will discontinue diuretics.  Per cardiology patient does not require diuretics at time of DC.  Weakness Shortness of breath I suspect that weakness and shortness of breath were likely more driven by pulmonary issues and cardiac.  CT chest with contrast performed during admission demonstrates rather significant evidence of interstitial edema unable to rule out atypical infection.  Pulmonary consult requested.  Patient initiated on intravenous steroids, continued on diuretics.  Azithromycin added for atypical coverage.  RVP negative.  Strep pneumo and Legionella urinary antigens are currently pending.  Will discharge on p.o. prednisone.  Completed azithromycin course in house.  No  antibiotics on discharge.  Follow-up  outpatient pulmonology.    Refractory hypertension Patient is on aggressive dosing regimen of several agents.  Will continue while hospitalized.  Home hydrochlorothiazide held on DC   Iron deficiency anemia Patient was presenting diagnosed with IDA and placed on oral iron supplementation.  Received IV Venofer 300 mg x 1 on 3/8.  Continue home oral iron replacement 325 mg daily      AKI on chronic kidney disease stage IIIa Creatinine above the time of discharge.  Will hold home hydrochlorothiazide.  Patient should follow-up with primary care and cardiology within 1 week of discharge for repeat lab work, assessment of kidney function, discussion regarding reintroduction of home hydrochlorothiazide and possible addition of loop diuretic.   Type 2 diabetes mellitus with hyperglycemia Patient can resume oral agents at time of discharge   Hyponatremia Felt to be secondary to fluid overload noted state/hypervolemic hyponatremia.     Hyperlipidemia PTA statin   Depression PTA Zoloft   Goals of care Discussed CODE STATUS at length with patient and family are at bedside.  Patient confirms she is a DNR.  She was DNR on previous admissions.  Discharge Instructions  Discharge Instructions     Diet - low sodium heart healthy   Complete by: As directed    Increase activity slowly   Complete by: As directed       Allergies as of 07/10/2022       Reactions   Clindamycin    dizziness   Oxytetracycline Hives   Phenobarbital Hives   Atropine Other (See Comments)   Reaction: unknown   Belladonna Alkaloids Other (See Comments)   Reaction: unknown   Codeine Nausea And Vomiting   Darvon [propoxyphene] Nausea And Vomiting   Demerol [meperidine] Nausea And Vomiting   Methocarbamol Other (See Comments)   Reaction: unknown   Penicillins Other (See Comments)   Pt and family unsure of details.  Can tolerate cephalosporins   Sulfa Antibiotics Other (See Comments)   Reaction: unknown   Iron  Other (See Comments)   High dose prescription gives her diarrhea        Medication List     STOP taking these medications    hydrochlorothiazide 12.5 MG capsule Commonly known as: MICROZIDE       TAKE these medications    acetaminophen 500 MG tablet Commonly known as: TYLENOL Take 1,000 mg by mouth every 6 (six) hours as needed for moderate pain or headache.   albuterol 108 (90 Base) MCG/ACT inhaler Commonly known as: VENTOLIN HFA Inhale 2 puffs into the lungs every 6 (six) hours as needed for wheezing or shortness of breath.   albuterol (2.5 MG/3ML) 0.083% nebulizer solution Commonly known as: PROVENTIL Take 3 mLs (2.5 mg total) by nebulization every 6 (six) hours as needed for wheezing or shortness of breath.   ascorbic acid 500 MG tablet Commonly known as: VITAMIN C Take 1 tablet (500 mg total) by mouth 2 (two) times daily.   atorvastatin 40 MG tablet Commonly known as: LIPITOR TAKE 1 TABLET BY MOUTH DAILY What changed: when to take this   carvedilol 6.25 MG tablet Commonly known as: COREG Take 1 tablet (6.25 mg total) by mouth 2 (two) times daily with a meal.   clopidogrel 75 MG tablet Commonly known as: PLAVIX TAKE ONE TABLET BY MOUTH EVERY DAY   Contour Next Test test strip Generic drug: glucose blood USE AS DIRECTED TWICE A DAY   COVID-19 Home Collection Test Kit Use  as needed for signs and symptoms of COVID INFECTION   CRANBERRY PO Take 2 capsules by mouth 2 (two) times daily.   famotidine 10 MG tablet Commonly known as: PEPCID Take 10 mg by mouth daily as needed for heartburn or indigestion.   glipiZIDE 5 MG tablet Commonly known as: GLUCOTROL TAKE ONE TABLET TWICE A DAY BEFORE MEALS   hydrALAZINE 25 MG tablet Commonly known as: APRESOLINE TAKE THREE TABLETS BY MOUTH WITH BREAKFAST  TAKE THREE TABLETS WITH LUNCHAND TAKE TWO TABLETS WITH DINNER AS DIRECTED   Iron (Ferrous Sulfate) 325 (65 Fe) MG Tabs Take 325 mg by mouth daily.    isosorbide mononitrate 30 MG 24 hr tablet Commonly known as: IMDUR TAKE ONE TABLET BY MOUTH EVERY MORNING AND TAKE TWO TABLETS EVERY EVENING   lansoprazole 30 MG capsule Commonly known as: PREVACID TAKE 1 CAPSULE EVERY DAY BEFORE SUPPER What changed: See the new instructions.   loratadine 10 MG tablet Commonly known as: CLARITIN Take 10 mg daily by mouth.   losartan 100 MG tablet Commonly known as: COZAAR TAKE ONE TABLET BY MOUTH EVERY DAY   nitroGLYCERIN 0.4 MG SL tablet Commonly known as: NITROSTAT Place 1 tablet (0.4 mg total) under the tongue every 5 (five) minutes as needed for chest pain.   predniSONE 10 MG tablet Commonly known as: DELTASONE Take 2 tablets (20 mg total) by mouth daily for 5 days.   sertraline 100 MG tablet Commonly known as: ZOLOFT TAKE 1 TABLET BY MOUTH DAILY.   Trospium Chloride 60 MG Cp24 TAKE ONE CAPSULE EACH MORNING BEFORE BREAKFAST What changed: See the new instructions.   Ultimate Probiotic Formula Caps Take 1 capsule by mouth daily with supper.   VITAMIN D PO Take 1 capsule by mouth in the morning.        Follow-up Information     [name], MD. Go in 1 week(s).   Specialty: Internal Medicine Why: Appointment on Thursday, 07/13/2022 at 11:00am. Contact information: [place] [address] [phone]         Tyler Pita, [place] in 1 week(s).   Specialty: Pulmonary Disease Why: Appointment on Tuesday, 08/08/2022 at 11:30am. Contact information: [address] [place] [address] [phone]                Allergies  Allergen Reactions   Clindamycin     dizziness   Oxytetracycline Hives   Phenobarbital Hives   Atropine Other (See Comments)    Reaction: unknown   Belladonna Alkaloids Other (See Comments)    Reaction: unknown   Codeine Nausea And Vomiting   Darvon [Propoxyphene] Nausea And Vomiting   Demerol [Meperidine] Nausea And Vomiting    Methocarbamol Other (See Comments)    Reaction: unknown   Penicillins Other (See Comments)    Pt and family unsure of details.  Can tolerate cephalosporins   Sulfa Antibiotics Other (See Comments)    Reaction: unknown   Iron Other (See Comments)    High dose prescription gives her diarrhea    Consultations: Cardiology Pulmonology   Procedures/Studies: CT Angio Chest Pulmonary Embolism (PE) W or WO Contrast  Result Date: 07/08/2022 CLINICAL DATA:  Progressive shortness of breath.  PE suspected. EXAM: CT ANGIOGRAPHY CHEST WITH CONTRAST TECHNIQUE: Multidetector CT imaging of the chest was performed using the standard protocol during bolus administration of intravenous contrast. Multiplanar CT image reconstructions and MIPs were obtained to evaluate the vascular anatomy. RADIATION DOSE REDUCTION: This  exam was performed according to the departmental dose-optimization program which includes automated exposure control, adjustment of the mA and/or kV according to patient size and/or use of iterative reconstruction technique. CONTRAST:  19m OMNIPAQUE IOHEXOL 350 MG/ML SOLN COMPARISON:  None Available. FINDINGS: Cardiovascular: Heart size upper normal. No substantial pericardial effusion. Coronary artery calcification is evident. Moderate atherosclerotic calcification is noted in the wall of the thoracic aorta. There is no filling defect within the opacified pulmonary arteries to suggest the presence of an acute pulmonary embolus. Mediastinum/Nodes: Upper normal to borderline enlarged mediastinal nodes evident including 11 mm short axis right paratracheal node on 27/4, 11 mm precarinal node on 43/4, and 12 mm short axis subcarinal node on 51/4. There is no hilar lymphadenopathy. The esophagus has normal imaging features. There is no axillary lymphadenopathy. Lungs/Pleura: Diffuse interstitial thickening noted in both lungs with patchy areas of ground-glass opacity bilaterally. Areas of interlobular septal  thickening evident. No overtly suspicious pulmonary nodule or mass. Insert no pleural effusion Upper Abdomen: Unremarkable. Musculoskeletal: No worrisome lytic or sclerotic osseous abnormality. Review of the MIP images confirms the above findings. IMPRESSION: 1. No CT evidence for acute pulmonary embolus. 2. Diffuse interstitial thickening in both lungs with patchy areas of ground-glass opacity bilaterally. Imaging features are nonspecific and may be related to edema or atypical infection. 3. Upper normal to borderline enlarged mediastinal nodes, likely reactive. Follow-up CT chest in 3 months recommended to ensure resolution. 4.  Aortic Atherosclerosis (ICD10-I70.0). Electronically Signed   By: EMisty StanleyM.D.   On: 07/08/2022 11:17   ECHOCARDIOGRAM COMPLETE  Result Date: 07/07/2022    ECHOCARDIOGRAM REPORT   Patient Name:   MCAILYNN LEFRANCOISDate of Exam: 07/07/2022 Medical Rec #:  [id number]    Height:       61.0 in Accession #:    [id number]   Weight:       140.0 lb Date of Birth:  [birth year]   BSA:          1.623 m Patient Age:    89years      BP:           160/63 mmHg Patient Gender: F             HR:           76 bpm. Exam Location:  [facility] Procedure: 2D Echo, Cardiac Doppler and Color Doppler Indications:     CHF  History:         Patient has prior history of Echocardiogram examinations, most                  recent 08/19/2020. CHF, CAD, Acute MI and Previous Myocardial                  Infarction; Risk Factors:Diabetes, Hypertension and                  Dyslipidemia.  Sonographer:     DWenda LowReferring Phys:  [id number] AceDiagnosing Phys: TIda RogueMD IMPRESSIONS  1. Left ventricular ejection fraction, by estimation, is 60 to 65%. The left ventricle has normal function. The left ventricle has no regional wall motion abnormalities. There is moderate left ventricular hypertrophy. Left ventricular diastolic parameters are consistent with Grade I diastolic dysfunction (impaired  relaxation).  2. Right ventricular systolic function is normal. The right ventricular size is normal. There is mildly elevated pulmonary artery systolic pressure. The estimated right ventricular systolic pressure is  36.6 mmHg.  3. Left atrial size was mildly dilated.  4. The mitral valve is normal in structure. Mild to moderate mitral valve regurgitation. No evidence of mitral stenosis.  5. Tricuspid valve regurgitation is mild to moderate.  6. The aortic valve is normal in structure. There is moderate calcification of the aortic valve. Aortic valve regurgitation is not visualized. Moderate aortic valve stenosis. Aortic valve area, by VTI measures 1.16 cm. Aortic valve mean gradient measures 25.5 mmHg. Aortic valve Vmax measures 3.40 m/s.  7. The inferior vena cava is normal in size with greater than 50% respiratory variability, suggesting right atrial pressure of 3 mmHg. FINDINGS  Left Ventricle: Left ventricular ejection fraction, by estimation, is 60 to 65%. The left ventricle has normal function. The left ventricle has no regional wall motion abnormalities. The left ventricular internal cavity size was normal in size. There is  moderate left ventricular hypertrophy. Left ventricular diastolic parameters are consistent with Grade I diastolic dysfunction (impaired relaxation). Right Ventricle: The right ventricular size is normal. No increase in right ventricular wall thickness. Right ventricular systolic function is normal. There is mildly elevated pulmonary artery systolic pressure. The tricuspid regurgitant velocity is 2.90  m/s, and with an assumed right atrial pressure of 3 mmHg, the estimated right ventricular systolic pressure is 123XX123 mmHg. Left Atrium: Left atrial size was mildly dilated. Right Atrium: Right atrial size was normal in size. Pericardium: There is no evidence of pericardial effusion. Mitral Valve: The mitral valve is normal in structure. Mild mitral annular calcification. Mild to moderate  mitral valve regurgitation. No evidence of mitral valve stenosis. MV peak gradient, 7.6 mmHg. The mean mitral valve gradient is 3.0 mmHg. Tricuspid Valve: The tricuspid valve is normal in structure. Tricuspid valve regurgitation is mild to moderate. No evidence of tricuspid stenosis. Aortic Valve: The aortic valve is normal in structure. There is moderate calcification of the aortic valve. Aortic valve regurgitation is not visualized. Moderate aortic stenosis is present. Aortic valve mean gradient measures 25.5 mmHg. Aortic valve peak gradient measures 46.3 mmHg. Aortic valve area, by VTI measures 1.16 cm. Pulmonic Valve: The pulmonic valve was normal in structure. Pulmonic valve regurgitation is mild. No evidence of pulmonic stenosis. Aorta: The aortic root is normal in size and structure. Venous: The inferior vena cava is normal in size with greater than 50% respiratory variability, suggesting right atrial pressure of 3 mmHg. IAS/Shunts: No atrial level shunt detected by color flow Doppler.  LEFT VENTRICLE PLAX 2D LVIDd:         3.30 cm   Diastology LVIDs:         1.80 cm   LV e' medial:    7.40 cm/s LV PW:         1.20 cm   LV E/e' medial:  13.8 LV IVS:        1.30 cm   LV e' lateral:   4.13 cm/s LVOT diam:     1.90 cm   LV E/e' lateral: 24.7 LV SV:         91 LV SV Index:   56 LVOT Area:     2.84 cm  RIGHT VENTRICLE RV Basal diam:  3.75 cm RV Mid diam:    3.30 cm RV S prime:     20.30 cm/s TAPSE (M-mode): 2.5 cm LEFT ATRIUM             Index        RIGHT ATRIUM  Index LA diam:        3.50 cm 2.16 cm/m   RA Area:     16.40 cm LA Vol (A2C):   82.6 ml 50.89 ml/m  RA Volume:   44.60 ml  27.48 ml/m LA Vol (A4C):   57.5 ml 35.43 ml/m LA Biplane Vol: 71.2 ml 43.87 ml/m  AORTIC VALVE                     PULMONIC VALVE AV Area (Vmax):    1.22 cm      PV Vmax:       1.49 m/s AV Area (Vmean):   1.04 cm      PV Peak grad:  8.9 mmHg AV Area (VTI):     1.16 cm AV Vmax:           340.25 cm/s AV Vmean:           228.500 cm/s AV VTI:            0.788 m AV Peak Grad:      46.3 mmHg AV Mean Grad:      25.5 mmHg LVOT Vmax:         146.00 cm/s LVOT Vmean:        83.500 cm/s LVOT VTI:          0.322 m LVOT/AV VTI ratio: 0.41  AORTA Ao Root diam: 3.10 cm Ao Asc diam:  2.90 cm MITRAL VALVE                TRICUSPID VALVE MV Area (PHT): 2.36 cm     TR Peak grad:   33.6 mmHg MV Area VTI:   2.25 cm     TR Vmax:        290.00 cm/s MV Peak grad:  7.6 mmHg MV Mean grad:  3.0 mmHg     SHUNTS MV Vmax:       1.38 m/s     Systemic VTI:  0.32 m MV Vmean:      85.9 cm/s    Systemic Diam: 1.90 cm MV Decel Time: 321 msec MV E velocity: 102.00 cm/s MV A velocity: 121.00 cm/s MV E/A ratio:  0.84 Ida Rogue MD Electronically signed by Ida Rogue MD Signature Date/Time: 07/07/2022/6:11:17 PM    Final    DG Chest 2 View  Result Date: 07/07/2022 CLINICAL DATA:  Chest pain EXAM: CHEST - 2 VIEW COMPARISON:  CXR 08/26/20 FINDINGS: No pleural effusion. No pneumothorax. There are hazy bibasilar airspace opacities, which could represent atelectasis or infection. Unchanged cardiac and mediastinal contours. No radiographically apparent displaced rib fractures. Visualized upper abdomen unremarkable. Vertebral body heights are maintained. Aortic atherosclerotic calcifications. IMPRESSION: Hazy bibasilar airspace opacities, which could represent atelectasis or infection. No pleural effusion. Electronically Signed   By: Marin Roberts M.D.   On: 07/07/2022 10:23      Subjective: Seen and examined on the day of discharge.  Stable no distress.  Family member at bedside.  Appropriate for discharge home.  Home health services ordered.  Discharge Exam: Vitals:   07/10/22 0740 07/10/22 0831  BP:  (!) 145/61  Pulse: 77 73  Resp: 16 17  Temp:    SpO2: 93% 93%   Vitals:   07/10/22 0006 07/10/22 0401 07/10/22 0740 07/10/22 0831  BP: (!) 134/58 (!) 152/64  (!) 145/61  Pulse: 78 79 77 73  Resp: '16 16 16 17  '$ Temp: 98.2 F (36.8 C) 97.8 F  (36.6 C)  TempSrc:      SpO2: 94% 95% 93% 93%  Weight:  57.2 kg    Height:        General: Pt is alert, awake, not in acute distress Cardiovascular: RRR, S1/S2 +, no rubs, no gallops Respiratory: CTA bilaterally, no wheezing, no rhonchi Abdominal: Soft, NT, ND, bowel sounds + Extremities: no edema, no cyanosis    The results of significant diagnostics from this hospitalization (including imaging, microbiology, ancillary and laboratory) are listed below for reference.     Microbiology: Recent Results (from the past 240 hour(s))  Resp panel by RT-PCR (RSV, Flu A&B, Covid) Anterior Nasal Swab     Status: None   Collection Time: 07/07/22 10:02 AM   Specimen: Anterior Nasal Swab  Result Value Ref Range Status   SARS Coronavirus 2 by RT PCR NEGATIVE NEGATIVE Final    Comment: (NOTE) SARS-CoV-2 target nucleic acids are NOT DETECTED.  The SARS-CoV-2 RNA is generally detectable in upper respiratory specimens during the acute phase of infection. The lowest concentration of SARS-CoV-2 viral copies this assay can detect is 138 copies/mL. A negative result does not preclude SARS-Cov-2 infection and should not be used as the sole basis for treatment or other patient management decisions. A negative result may occur with  improper specimen collection/handling, submission of specimen other than nasopharyngeal swab, presence of viral mutation(s) within the areas targeted by this assay, and inadequate number of viral copies(<138 copies/mL). A negative result must be combined with clinical observations, patient history, and epidemiological information. The expected result is Negative.  Fact Sheet for Patients:  EntrepreneurPulse.com.au  Fact Sheet for Healthcare Providers:  IncredibleEmployment.be  This test is no t yet approved or cleared by the [place] FDA and  has been authorized for detection and/or diagnosis of SARS-CoV-2 by FDA under an  Emergency Use Authorization (EUA). This EUA will remain  in effect (meaning this test can be used) for the duration of the COVID-19 declaration under Section 564(b)(1) of the Act, 21 U.S.C.section 360bbb-3(b)(1), unless the authorization is terminated  or revoked sooner.       Influenza A by PCR NEGATIVE NEGATIVE Final   Influenza B by PCR NEGATIVE NEGATIVE Final    Comment: (NOTE) The Xpert Xpress SARS-CoV-2/FLU/RSV plus assay is intended as an aid in the diagnosis of influenza from Nasopharyngeal swab specimens and should not be used as a sole basis for treatment. Nasal washings and aspirates are unacceptable for Xpert Xpress SARS-CoV-2/FLU/RSV testing.  Fact Sheet for Patients: EntrepreneurPulse.com.au  Fact Sheet for Healthcare Providers: IncredibleEmployment.be  This test is not yet approved or cleared by the [place] FDA and has been authorized for detection and/or diagnosis of SARS-CoV-2 by FDA under an Emergency Use Authorization (EUA). This EUA will remain in effect (meaning this test can be used) for the duration of the COVID-19 declaration under Section 564(b)(1) of the Act, 21 U.S.C. section 360bbb-3(b)(1), unless the authorization is terminated or revoked.     Resp Syncytial Virus by PCR NEGATIVE NEGATIVE Final    Comment: (NOTE) Fact Sheet for Patients: EntrepreneurPulse.com.au  Fact Sheet for Healthcare Providers: IncredibleEmployment.be  This test is not yet approved or cleared by the [place] FDA and has been authorized for detection and/or diagnosis of SARS-CoV-2 by FDA under an Emergency Use Authorization (EUA). This EUA will remain in effect (meaning this test can be used) for the duration of the COVID-19 declaration under Section 564(b)(1) of the Act, 21 U.S.C. section 360bbb-3(b)(1), unless the authorization is terminated or  revoked.  Performed at [facility], [address] [place],  [address]      Labs: BNP (last 3 results) Recent Labs    07/07/22 1035  BNP 123456*   Basic Metabolic Panel: Recent Labs  Lab 07/07/22 1035 07/08/22 0358 07/09/22 0435 07/10/22 0334  NA 129* 133* 134* 132*  K 3.6 3.0* 4.8 4.6  CL 100 99 106 105  CO2 19* 20* 19* 18*  GLUCOSE 204* 100* 238* 261*  BUN 40* 37* 45* 64*  CREATININE 1.22* 1.24* 1.37* 1.87*  CALCIUM 8.9 9.0 9.0 8.9   Liver Function Tests: Recent Labs  Lab 07/07/22 1035  AST 18  ALT 15  ALKPHOS 82  BILITOT 0.7  PROT 6.5  ALBUMIN 3.3*   No results for input(s): "LIPASE", "AMYLASE" in the last 168 hours. No results for input(s): "AMMONIA" in the last 168 hours. CBC: Recent Labs  Lab 07/07/22 1035 07/08/22 0358 07/09/22 0435  WBC 12.2* 11.1* 11.3*  HGB 10.2* 9.4* 9.3*  HCT 30.7* 27.9* 28.4*  MCV 82.3 79.9* 81.4  PLT 226 223 225   Cardiac Enzymes: No results for input(s): "CKTOTAL", "CKMB", "CKMBINDEX", "TROPONINI" in the last 168 hours. BNP: Invalid input(s): "POCBNP" CBG: Recent Labs  Lab 07/09/22 0819 07/09/22 1209 07/09/22 1713 07/09/22 2134 07/10/22 0830  GLUCAP 274* 393* 242* 261* 316*   D-Dimer No results for input(s): "DDIMER" in the last 72 hours. Hgb A1c No results for input(s): "HGBA1C" in the last 72 hours. Lipid Profile No results for input(s): "CHOL", "HDL", "LDLCALC", "TRIG", "CHOLHDL", "LDLDIRECT" in the last 72 hours. Thyroid function studies No results for input(s): "TSH", "T4TOTAL", "T3FREE", "THYROIDAB" in the last 72 hours.  Invalid input(s): "FREET3" Anemia work up No results for input(s): "VITAMINB12", "FOLATE", "FERRITIN", "TIBC", "IRON", "RETICCTPCT" in the last 72 hours. Urinalysis    Component Value Date/Time   COLORURINE YELLOW (A) 07/07/2022 1120   APPEARANCEUR HAZY (A) 07/07/2022 1120   LABSPEC 1.011 07/07/2022 1120   PHURINE 5.0 07/07/2022 1120   GLUCOSEU NEGATIVE 07/07/2022 1120   GLUCOSEU NEGATIVE  06/27/2022 1133   HGBUR NEGATIVE 07/07/2022 1120   BILIRUBINUR NEGATIVE 07/07/2022 1120   BILIRUBINUR neg 04/01/2021 1103   KETONESUR NEGATIVE 07/07/2022 1120   PROTEINUR 30 (A) 07/07/2022 1120   UROBILINOGEN 0.2 06/27/2022 1133   NITRITE NEGATIVE 07/07/2022 1120   LEUKOCYTESUR MODERATE (A) 07/07/2022 1120   Sepsis Labs Recent Labs  Lab 07/07/22 1035 07/08/22 0358 07/09/22 0435  WBC 12.2* 11.1* 11.3*   Microbiology Recent Results (from the past 240 hour(s))  Resp panel by RT-PCR (RSV, Flu A&B, Covid) Anterior Nasal Swab     Status: None   Collection Time: 07/07/22 10:02 AM   Specimen: Anterior Nasal Swab  Result Value Ref Range Status   SARS Coronavirus 2 by RT PCR NEGATIVE NEGATIVE Final    Comment: (NOTE) SARS-CoV-2 target nucleic acids are NOT DETECTED.  The SARS-CoV-2 RNA is generally detectable in upper respiratory specimens during the acute phase of infection. The lowest concentration of SARS-CoV-2 viral copies this assay can detect is 138 copies/mL. A negative result does not preclude SARS-Cov-2 infection and should not be used as the sole basis for treatment or other patient management decisions. A negative result may occur with  improper specimen collection/handling, submission of specimen other than nasopharyngeal swab, presence of viral mutation(s) within the areas targeted by this assay, and inadequate number of viral copies(<138 copies/mL). A negative result must be combined with clinical observations, patient history, and epidemiological information.  The expected result is Negative.  Fact Sheet for Patients:  EntrepreneurPulse.com.au  Fact Sheet for Healthcare Providers:  IncredibleEmployment.be  This test is no t yet approved or cleared by the [place] FDA and  has been authorized for detection and/or diagnosis of SARS-CoV-2 by FDA under an Emergency Use Authorization (EUA). This EUA will remain  in effect  (meaning this test can be used) for the duration of the COVID-19 declaration under Section 564(b)(1) of the Act, 21 U.S.C.section 360bbb-3(b)(1), unless the authorization is terminated  or revoked sooner.       Influenza A by PCR NEGATIVE NEGATIVE Final   Influenza B by PCR NEGATIVE NEGATIVE Final    Comment: (NOTE) The Xpert Xpress SARS-CoV-2/FLU/RSV plus assay is intended as an aid in the diagnosis of influenza from Nasopharyngeal swab specimens and should not be used as a sole basis for treatment. Nasal washings and aspirates are unacceptable for Xpert Xpress SARS-CoV-2/FLU/RSV testing.  Fact Sheet for Patients: EntrepreneurPulse.com.au  Fact Sheet for Healthcare Providers: IncredibleEmployment.be  This test is not yet approved or cleared by the [place] FDA and has been authorized for detection and/or diagnosis of SARS-CoV-2 by FDA under an Emergency Use Authorization (EUA). This EUA will remain in effect (meaning this test can be used) for the duration of the COVID-19 declaration under Section 564(b)(1) of the Act, 21 U.S.C. section 360bbb-3(b)(1), unless the authorization is terminated or revoked.     Resp Syncytial Virus by PCR NEGATIVE NEGATIVE Final    Comment: (NOTE) Fact Sheet for Patients: EntrepreneurPulse.com.au  Fact Sheet for Healthcare Providers: IncredibleEmployment.be  This test is not yet approved or cleared by the [place] FDA and has been authorized for detection and/or diagnosis of SARS-CoV-2 by FDA under an Emergency Use Authorization (EUA). This EUA will remain in effect (meaning this test can be used) for the duration of the COVID-19 declaration under Section 564(b)(1) of the Act, 21 U.S.C. section 360bbb-3(b)(1), unless the authorization is terminated or revoked.  Performed at [facility], [address], [place], [place] [address]      Time  coordinating discharge: Over 30 minutes  SIGNED:   [name], MD  [facility] 07/10/2022, 1:02 PM Pager   If 7PM-7AM, please contact night-coverage

## 2022-07-10 NOTE — Progress Notes (Signed)
Rounding Note    Patient Name: Maureen Morales Date of Encounter: 07/10/2022  [place] Cardiologist: [name], MD   Subjective   She feels significantly better with no chest pain or shortness of breath.  Inpatient Medications    Scheduled Meds:  albuterol  2.5 mg Nebulization TID   atorvastatin  40 mg Oral Daily   budesonide (PULMICORT) nebulizer solution  0.5 mg Nebulization BID   carvedilol  6.25 mg Oral BID WC   clopidogrel  75 mg Oral Daily   ferrous sulfate  325 mg Oral Daily   hydrALAZINE  25 mg Oral Q8H   insulin aspart  0-5 Units Subcutaneous QHS   insulin aspart  0-9 Units Subcutaneous TID WC   isosorbide mononitrate  60 mg Oral Daily   loratadine  10 mg Oral Daily   losartan  100 mg Oral Daily   methylPREDNISolone (SOLU-MEDROL) injection  20 mg Intravenous Q12H   pantoprazole  40 mg Oral Daily   sertraline  100 mg Oral Daily   sodium chloride flush  3 mL Intravenous Q12H   Continuous Infusions:  sodium chloride     azithromycin Stopped (07/09/22 1351)   PRN Meds: sodium chloride, acetaminophen, albuterol, nitroGLYCERIN, ondansetron (ZOFRAN) IV, sodium chloride flush   Vital Signs    Vitals:   07/10/22 0006 07/10/22 0401 07/10/22 0740 07/10/22 0831  BP: (!) 134/58 (!) 152/64  (!) 145/61  Pulse: 78 79 77 73  Resp: '16 16 16 17  '$ Temp: 98.2 F (36.8 C) 97.8 F (36.6 C)    TempSrc:      SpO2: 94% 95% 93% 93%  Weight:  57.2 kg    Height:        Intake/Output Summary (Last 24 hours) at 07/10/2022 0922 Last data filed at 07/10/2022 0749 Gross per 24 hour  Intake 500 ml  Output 300 ml  Net 200 ml      07/10/2022    4:01 AM 07/09/2022    5:00 AM 07/08/2022    5:00 AM  Last 3 Weights  Weight (lbs) 126 lb 1.7 oz 130 lb 8.2 oz 130 lb 15.3 oz  Weight (kg) 57.2 kg 59.2 kg 59.4 kg      Telemetry    Normal sinus rhythm- Personally Reviewed  ECG     - Personally Reviewed  Physical Exam    GEN: No acute distress.   Neck: No  JVD Cardiac: RRR, no rubs, or gallops. 3/6 SEM in the aortic area, mid peaking  Respiratory: Clear to auscultation bilaterally. GI: Soft, nontender, non-distended  MS: No edema; No deformity. Neuro:  Nonfocal  Psych: Normal affect   Labs    High Sensitivity Troponin:   Recent Labs  Lab 07/07/22 1035 07/07/22 1120  TROPONINIHS 550* 518*     Chemistry Recent Labs  Lab 07/07/22 1035 07/08/22 0358 07/09/22 0435 07/10/22 0334  NA 129* 133* 134* 132*  K 3.6 3.0* 4.8 4.6  CL 100 99 106 105  CO2 19* 20* 19* 18*  GLUCOSE 204* 100* 238* 261*  BUN 40* 37* 45* 64*  CREATININE 1.22* 1.24* 1.37* 1.87*  CALCIUM 8.9 9.0 9.0 8.9  PROT 6.5  --   --   --   ALBUMIN 3.3*  --   --   --   AST 18  --   --   --   ALT 15  --   --   --   ALKPHOS 82  --   --   --  BILITOT 0.7  --   --   --   GFRNONAA 40* 40* 35* 24*  ANIONGAP '10 14 9 9    '$ Lipids No results for input(s): "CHOL", "TRIG", "HDL", "LABVLDL", "LDLCALC", "CHOLHDL" in the last 168 hours.  Hematology Recent Labs  Lab 07/07/22 1035 07/08/22 0358 07/09/22 0435  WBC 12.2* 11.1* 11.3*  RBC 3.73* 3.49* 3.49*  HGB 10.2* 9.4* 9.3*  HCT 30.7* 27.9* 28.4*  MCV 82.3 79.9* 81.4  MCH 27.3 26.9 26.6  MCHC 33.2 33.7 32.7  RDW 13.1 13.0 13.0  PLT 226 223 225   Thyroid No results for input(s): "TSH", "FREET4" in the last 168 hours.  BNP Recent Labs  Lab 07/07/22 1035  BNP 300.1*    DDimer No results for input(s): "DDIMER" in the last 168 hours.   Radiology    CT Angio Chest Pulmonary Embolism (PE) W or WO Contrast  Result Date: 07/08/2022 CLINICAL DATA:  Progressive shortness of breath.  PE suspected. EXAM: CT ANGIOGRAPHY CHEST WITH CONTRAST TECHNIQUE: Multidetector CT imaging of the chest was performed using the standard protocol during bolus administration of intravenous contrast. Multiplanar CT image reconstructions and MIPs were obtained to evaluate the vascular anatomy. RADIATION DOSE REDUCTION: This exam was performed  according to the departmental dose-optimization program which includes automated exposure control, adjustment of the mA and/or kV according to patient size and/or use of iterative reconstruction technique. CONTRAST:  72m OMNIPAQUE IOHEXOL 350 MG/ML SOLN COMPARISON:  None Available. FINDINGS: Cardiovascular: Heart size upper normal. No substantial pericardial effusion. Coronary artery calcification is evident. Moderate atherosclerotic calcification is noted in the wall of the thoracic aorta. There is no filling defect within the opacified pulmonary arteries to suggest the presence of an acute pulmonary embolus. Mediastinum/Nodes: Upper normal to borderline enlarged mediastinal nodes evident including 11 mm short axis right paratracheal node on 27/4, 11 mm precarinal node on 43/4, and 12 mm short axis subcarinal node on 51/4. There is no hilar lymphadenopathy. The esophagus has normal imaging features. There is no axillary lymphadenopathy. Lungs/Pleura: Diffuse interstitial thickening noted in both lungs with patchy areas of ground-glass opacity bilaterally. Areas of interlobular septal thickening evident. No overtly suspicious pulmonary nodule or mass. Insert no pleural effusion Upper Abdomen: Unremarkable. Musculoskeletal: No worrisome lytic or sclerotic osseous abnormality. Review of the MIP images confirms the above findings. IMPRESSION: 1. No CT evidence for acute pulmonary embolus. 2. Diffuse interstitial thickening in both lungs with patchy areas of ground-glass opacity bilaterally. Imaging features are nonspecific and may be related to edema or atypical infection. 3. Upper normal to borderline enlarged mediastinal nodes, likely reactive. Follow-up CT chest in 3 months recommended to ensure resolution. 4.  Aortic Atherosclerosis (ICD10-I70.0). Electronically Signed   By: EMisty StanleyM.D.   On: 07/08/2022 11:17    Cardiac Studies   I personally reviewed her echocardiogram done yesterday which showed  hyperdynamic LV systolic function with moderate aortic stenosis and minimal pulmonary hypertension.  Patient Profile     89y.o. female with a hx of aortic stenosis, coronary artery disease, hypertension, hyperlipidemia, type 2 diabetes, iron deficiency anemia, who is being seen 07/07/2022 for the evaluation of shortness of breath and elevated high sensitivity troponins at the request of Dr. SPriscella Mann   Assessment & Plan    1.  Elevated troponin likely due to supply demand mismatch: Less likely acute coronary syndrome.  Echocardiogram showed normal LV systolic function and wall motion and she seems to be improved.  No invasive evaluations recommended.  Continue medical therapy.  She is already on dual antiplatelet therapy.  Continue carvedilol and atorvastatin. She is limited by right hip pain and cannot participate in cardiac rehab.  2.  Aortic stenosis: This is moderate.  I personally reviewed her echocardiogram.  Will continue to monitor this as an outpatient.  3.  Atypical pneumonia: Seems to be improving with treatment.  4.  Acute on chronic diastolic heart failure: She was diuresed but renal function is worse.  I agree with holding diuretics.  She does not need to be discharged home on a diuretic.  5.  Anemia: Status post IV iron infusion.  She can be discharged home from a cardiac standpoint on current meds. Will arrange follow up in 1 week.   For questions or updates, please contact [facility] Please consult [email or web address] for contact info under        Signed, [name], MD  07/10/2022, 9:22 AM

## 2022-07-10 NOTE — Plan of Care (Signed)

## 2022-07-11 ENCOUNTER — Telehealth: Payer: Self-pay

## 2022-07-11 ENCOUNTER — Other Ambulatory Visit: Payer: Self-pay | Admitting: Internal Medicine

## 2022-07-11 ENCOUNTER — Telehealth: Payer: Self-pay | Admitting: *Deleted

## 2022-07-11 ENCOUNTER — Ambulatory Visit: Payer: Medicare Other | Admitting: Cardiovascular Disease

## 2022-07-11 LAB — LEGIONELLA PNEUMOPHILA SEROGP 1 UR AG: L. pneumophila Serogp 1 Ur Ag: NEGATIVE

## 2022-07-11 NOTE — Telephone Encounter (Signed)
FYI

## 2022-07-11 NOTE — Transitions of Care (Post Inpatient/ED Visit) (Signed)
   07/11/2022  Name: Maureen Morales MRN: [id number] DOB: 08/19/1922  Today's TOC FU Call Status: Today's TOC FU Call Status:: Successful TOC FU Call Competed TOC FU Call Complete Date: 07/11/22  Transition Care Management Follow-up Telephone Call Date of Discharge: 07/10/22 Discharge Facility: [facility]) Type of Discharge: Inpatient Admission Primary Inpatient Discharge Diagnosis:: NSTEMI How have you been since you were released from the hospital?: Better Any questions or concerns?: No  Items Reviewed: Did you receive and understand the discharge instructions provided?: Yes Medications obtained and verified?: Yes (Medications Reviewed) Any new allergies since your discharge?: No Dietary orders reviewed?: No Do you have support at home?: Yes People in Home: child(ren), adult Name of Support/Comfort Primary Source: [place] and Equipment/Supplies: [facility] Ordered?: Yes Name of Tiger:: [place] set up a time to come to your home?: Yes [place] Visit Date: 07/19/22 Any new equipment or medical supplies ordered?: No  Functional Questionnaire: Do you need assistance with bathing/showering or dressing?: Yes Do you need assistance with meal preparation?: Yes Do you need assistance with eating?: No Do you have difficulty maintaining continence: No Do you need assistance with getting out of bed/getting out of a chair/moving?: No Do you have difficulty managing or taking your medications?: Yes  Folllow up appointments reviewed: PCP Follow-up appointment confirmed?: Yes Date of PCP follow-up appointment?: 07/13/22 Follow-up Provider: Dr Derrel Nip [facility] Follow-up appointment confirmed?: Yes Date of Specialist follow-up appointment?: 07/20/22 Follow-Up Specialty Provider:: [id number] Dr Fletcher Anon, [id number] 9:00 Dr Patsey Berthold Do you need transportation to your follow-up appointment?:  No Do you understand care options if your condition(s) worsen?: Yes-patient verbalized understanding  SDOH Interventions Today    Flowsheet Row Most Recent Value  SDOH Interventions   Food Insecurity Interventions Intervention Not Indicated  Housing Interventions Intervention Not Indicated  Transportation Interventions Intervention Not Indicated          Johny Shock BSN RN [facility] [phone]

## 2022-07-11 NOTE — Telephone Encounter (Signed)
Tiffiany called from [facility] to state they received a referral from the hospital for patient to have PT and OT.  Tiffiany states patient's family would like to start the PT and OT on 07/19/2022.

## 2022-07-13 ENCOUNTER — Encounter: Payer: Self-pay | Admitting: Internal Medicine

## 2022-07-13 ENCOUNTER — Ambulatory Visit (INDEPENDENT_AMBULATORY_CARE_PROVIDER_SITE_OTHER): Payer: Medicare Other | Admitting: Internal Medicine

## 2022-07-13 ENCOUNTER — Other Ambulatory Visit: Payer: Self-pay | Admitting: Internal Medicine

## 2022-07-13 VITALS — BP 122/54 | HR 69 | Temp 97.5°F | Ht 61.0 in | Wt 134.8 lb

## 2022-07-13 DIAGNOSIS — E872 Acidosis, unspecified: Secondary | ICD-10-CM | POA: Diagnosis not present

## 2022-07-13 DIAGNOSIS — D509 Iron deficiency anemia, unspecified: Secondary | ICD-10-CM

## 2022-07-13 DIAGNOSIS — F411 Generalized anxiety disorder: Secondary | ICD-10-CM | POA: Diagnosis not present

## 2022-07-13 DIAGNOSIS — Z7984 Long term (current) use of oral hypoglycemic drugs: Secondary | ICD-10-CM

## 2022-07-13 DIAGNOSIS — E1121 Type 2 diabetes mellitus with diabetic nephropathy: Secondary | ICD-10-CM | POA: Diagnosis not present

## 2022-07-13 DIAGNOSIS — N3944 Nocturnal enuresis: Secondary | ICD-10-CM

## 2022-07-13 DIAGNOSIS — K921 Melena: Secondary | ICD-10-CM

## 2022-07-13 DIAGNOSIS — I060 Rheumatic aortic stenosis: Secondary | ICD-10-CM

## 2022-07-13 DIAGNOSIS — N1832 Chronic kidney disease, stage 3b: Secondary | ICD-10-CM

## 2022-07-13 DIAGNOSIS — R2681 Unsteadiness on feet: Secondary | ICD-10-CM | POA: Diagnosis not present

## 2022-07-13 DIAGNOSIS — Z7189 Other specified counseling: Secondary | ICD-10-CM | POA: Diagnosis not present

## 2022-07-13 DIAGNOSIS — D649 Anemia, unspecified: Secondary | ICD-10-CM

## 2022-07-13 DIAGNOSIS — Z09 Encounter for follow-up examination after completed treatment for conditions other than malignant neoplasm: Secondary | ICD-10-CM

## 2022-07-13 DIAGNOSIS — I252 Old myocardial infarction: Secondary | ICD-10-CM | POA: Diagnosis not present

## 2022-07-13 LAB — POC HEMOCCULT BLD/STL (OFFICE/1-CARD/DIAGNOSTIC): Fecal Occult Blood, POC: POSITIVE — AB

## 2022-07-13 LAB — POC URINALSYSI DIPSTICK (AUTOMATED)
Bilirubin, UA: NEGATIVE
Blood, UA: NEGATIVE
Glucose, UA: NEGATIVE
Ketones, UA: NEGATIVE
Nitrite, UA: NEGATIVE
Protein, UA: NEGATIVE
Spec Grav, UA: <=1.005 — AB (ref 1.010–1.025)
Urobilinogen, UA: 0.2 E.U./dL
pH, UA: 5.5 (ref 5.0–8.0)

## 2022-07-13 LAB — CBC WITH DIFFERENTIAL/PLATELET
Basophils Absolute: 0 10*3/uL (ref 0.0–0.1)
Basophils Relative: 0.2 % (ref 0.0–3.0)
Eosinophils Absolute: 0.3 10*3/uL (ref 0.0–0.7)
Eosinophils Relative: 1.9 % (ref 0.0–5.0)
HCT: 29.3 % — ABNORMAL LOW (ref 36.0–46.0)
Hemoglobin: 9.8 g/dL — ABNORMAL LOW (ref 12.0–15.0)
Lymphocytes Relative: 7.6 % — ABNORMAL LOW (ref 12.0–46.0)
Lymphs Abs: 1 10*3/uL (ref 0.7–4.0)
MCHC: 33.3 g/dL (ref 30.0–36.0)
MCV: 82 fl (ref 78.0–100.0)
Monocytes Absolute: 0.7 10*3/uL (ref 0.1–1.0)
Monocytes Relative: 5 % (ref 3.0–12.0)
Neutro Abs: 11.8 10*3/uL — ABNORMAL HIGH (ref 1.4–7.7)
Neutrophils Relative %: 85.3 % — ABNORMAL HIGH (ref 43.0–77.0)
Platelets: 265 10*3/uL (ref 150.0–400.0)
RBC: 3.57 Mil/uL — ABNORMAL LOW (ref 3.87–5.11)
RDW: 14 % (ref 11.5–15.5)
WBC: 13.8 10*3/uL — ABNORMAL HIGH (ref 4.0–10.5)

## 2022-07-13 LAB — MICROALBUMIN / CREATININE URINE RATIO
Creatinine,U: 33.7 mg/dL
Microalb Creat Ratio: 14.3 mg/g (ref 0.0–30.0)
Microalb, Ur: 4.8 mg/dL — ABNORMAL HIGH (ref 0.0–1.9)

## 2022-07-13 LAB — BASIC METABOLIC PANEL
BUN: 59 mg/dL — ABNORMAL HIGH (ref 6–23)
CO2: 19 mEq/L (ref 19–32)
Calcium: 9.5 mg/dL (ref 8.4–10.5)
Chloride: 103 mEq/L (ref 96–112)
Creatinine, Ser: 1.35 mg/dL — ABNORMAL HIGH (ref 0.40–1.20)
GFR: 32.98 mL/min — ABNORMAL LOW (ref >60.00)
Glucose, Bld: 240 mg/dL — ABNORMAL HIGH (ref 70–99)
Potassium: 4.2 mEq/L (ref 3.5–5.1)
Sodium: 133 mEq/L — ABNORMAL LOW (ref 135–145)

## 2022-07-13 LAB — PHOSPHORUS: Phosphorus: 3.9 mg/dL (ref 2.3–4.6)

## 2022-07-13 MED ORDER — LANSOPRAZOLE 30 MG PO CPDR: 30.0000 mg | DELAYED_RELEASE_CAPSULE | Freq: Two times a day (BID) | ORAL | 0 refills | Status: AC

## 2022-07-13 MED ORDER — OYSTER SHELL CALCIUM/D3 500-5 MG-MCG PO TABS: 1.0000 | ORAL_TABLET | Freq: Two times a day (BID) | ORAL | 11 refills | Status: DC

## 2022-07-13 NOTE — Assessment & Plan Note (Signed)
Iron deficiency noted,  FOBT was faintly positive.  Repeating CBC,  continue oral iron.  Given her age and medical comordities,  no workup is planned other than to increase PPI to bid

## 2022-07-13 NOTE — Assessment & Plan Note (Addendum)
moderate by ECHO done during hospitalization, now with mild to moderate mitral valve regurgitation.  Maureen Morales  Historically, attempts to lower BP to 130/80 or less have resulted in weakness and fatigue as she is currently reporting. Hctz has been discontinued; she received IV furosemide during hospitalization caused a decline in GFR .  Will suspend diuretics for now.

## 2022-07-13 NOTE — Assessment & Plan Note (Signed)
Doscontinue  100 mg sertraline daily to manage excessive worrying which daughter has reported and results in elevated blood pressure

## 2022-07-13 NOTE — Assessment & Plan Note (Addendum)
Gap of 14 (was 16 2 weeks ago ) Secondary to CKD   May be contributing to her malaise and fatigue. May benefit from alkanization,  either with sodium bicarbonate tablets  or calcium carbonate to keep HC03 at 23 or higher . Repeat BMET  urine lytes ordered  urine Ph is 5.5 today   Lab Results  Component Value Date   NA 132 (L) 07/10/2022   K 4.6 07/10/2022   CL 105 07/10/2022   CO2 18 (L) 07/10/2022

## 2022-07-13 NOTE — Assessment & Plan Note (Signed)
Adequate control;  would benefit from SGLT 2 inhibitor lbut her urinary incontinence would be aggravated and likely increase her risk for skin infections.  Continue glipizide,  No changes given given her age.   Lab Results  Component Value Date   HGBA1C 7.3 (H) 06/30/2022

## 2022-07-13 NOTE — Assessment & Plan Note (Signed)
She was admitted with a troponin of > 500 after having angina relieved with ntg x 1.  She was treated with IV heparin x 48 hours  and discharged on home dose of plavix.

## 2022-07-13 NOTE — Assessment & Plan Note (Addendum)
She tends to avoid drinking water because of her urge incontinence.  I have reiterated the need for adequate hydration . Continue Trospium

## 2022-07-13 NOTE — Assessment & Plan Note (Signed)
She is no longer encouraged to ambulate given her recurrent right leg fractures and uses a manual wheelchair 24/7

## 2022-07-13 NOTE — Assessment & Plan Note (Signed)
Patient is stable post discharge and has no new issues or questions about discharge plans at the visit today for hospital follow up. All labs , imaging studies and progress notes from admission were reviewed with patient today .  Follow up labs were drawn and follow up appointments with cardiology and pulmonology were confirmed  home health was ordered at discharge

## 2022-07-13 NOTE — Progress Notes (Addendum)
Subjective:  Patient ID: Maureen Morales, female    DOB: [birth year]  Age: 89 y.o. MRN: [id number]  CC: The primary encounter diagnosis was DNR (do not resuscitate) discussion. Diagnoses of Metabolic acidosis, CKD stage G3b/A1, GFR 30-44 and albumin creatinine ratio <30 mg/g (HCC), Black stools, Iron deficiency anemia, unspecified iron deficiency anemia type, Urinary incontinence, nocturnal enuresis, Unsteady gait, Type II diabetes mellitus with nephropathy ([place]), Rheumatic aortic stenosis, Hospital discharge follow-up, Generalized anxiety disorder, History of non-ST elevation myocardial infarction (NSTEMI), and Normocytic anemia were also pertinent to this visit.   HPI [name] presents for  Chief Complaint  Patient presents with   Hospitalization Follow-up   Maureen Morales is a delightful 89 yr old female with type 2 DM  CKD stage 3 b with anemia, moderate aortic stenosis,  with difficult to control hypertension,  CAD with  history of NSTEMI  who was admitted from March 8 to March 11 to [facility] with a 2 week history of non specific malaise and anorexia  followed by an episode of SSCP relieved by NTG.  Admission diagnosis was NSTEMI. ECHO was unchanged from prior. (E/F normal, right sided pressures elevated,  diastolic dysfunction)  CTA of chest was negative for PE but positive for bilalteral infiltrates vs edema.  BNP was 300 and COVID/Influenza tests were negative  . She was treated empirically with  IV iron, Azithromycin,  IV steroids and IV furosemide with decline in GFR .  Hctz was suspended from home medications. She was discharged on prednisone taper  and referral for home health RN/PT, and follow up with cardiology/pulmonology  and has appointments in march and April.  She was noted to have a 9 lb weight loss   She presents today with [name], her daughter,  and [name],  one of her caregivers.  They report that her appetite has improved,  but she continues to have profound fatigue/   she is tolerating  oral iron.  She notes that her stools have been black,  but she denies abdominal pain .  She takes Plavix and avoids NSAIDs ,  takes lansoprazole prn      Outpatient Medications Prior to Visit  Medication Sig Dispense Refill   acetaminophen (TYLENOL) 500 MG tablet Take 1,000 mg by mouth every 6 (six) hours as needed for moderate pain or headache.     albuterol (PROVENTIL) (2.5 MG/3ML) 0.083% nebulizer solution Take 3 mLs (2.5 mg total) by nebulization every 6 (six) hours as needed for wheezing or shortness of breath. 75 mL 12   albuterol (VENTOLIN HFA) 108 (90 Base) MCG/ACT inhaler Inhale 2 puffs into the lungs every 6 (six) hours as needed for wheezing or shortness of breath. 18 g 3   atorvastatin (LIPITOR) 40 MG tablet TAKE 1 TABLET BY MOUTH DAILY (Patient taking differently: Take 40 mg by mouth at bedtime.) 90 tablet 1   carvedilol (COREG) 6.25 MG tablet Take 1 tablet (6.25 mg total) by mouth 2 (two) times daily with a meal. 180 tablet 3   clopidogrel (PLAVIX) 75 MG tablet TAKE ONE TABLET BY MOUTH EVERY DAY 90 tablet 1   CONTOUR NEXT TEST test strip USE AS DIRECTED TWICE A DAY 100 each 5   COVID-19 Home Collection Test KIT Use as needed for signs and symptoms of COVID INFECTION 4 kit 1   CRANBERRY PO Take 2 capsules by mouth 2 (two) times daily.     famotidine (PEPCID) 10 MG tablet Take 10 mg by mouth daily as needed  for heartburn or indigestion.     glipiZIDE (GLUCOTROL) 5 MG tablet TAKE ONE TABLET TWICE A DAY BEFORE MEALS 180 tablet 1   hydrALAZINE (APRESOLINE) 25 MG tablet TAKE THREE TABLETS BY MOUTH WITH BREAKFAST  TAKE THREE TABLETS WITH LUNCHAND TAKE TWO TABLETS WITH DINNER AS DIRECTED 360 tablet 1   Iron, Ferrous Sulfate, 325 (65 Fe) MG TABS Take 325 mg by mouth daily. 90 tablet 0   isosorbide mononitrate (IMDUR) 30 MG 24 hr tablet TAKE ONE TABLET BY MOUTH EVERY MORNING AND TAKE TWO TABLETS EVERY EVENING 270 tablet 1   Lactobacillus (ULTIMATE PROBIOTIC FORMULA) CAPS Take 1 capsule by  mouth daily with supper.     loratadine (CLARITIN) 10 MG tablet Take 10 mg daily by mouth.      losartan (COZAAR) 100 MG tablet TAKE ONE TABLET BY MOUTH EVERY DAY 90 tablet 1   nitroGLYCERIN (NITROSTAT) 0.4 MG SL tablet Place 1 tablet (0.4 mg total) under the tongue every 5 (five) minutes as needed for chest pain. 10 tablet 2   predniSONE (DELTASONE) 10 MG tablet Take 2 tablets (20 mg total) by mouth daily for 5 days. 10 tablet 0   sertraline (ZOLOFT) 100 MG tablet TAKE 1 TABLET BY MOUTH DAILY. 30 tablet 3   Trospium Chloride 60 MG CP24 TAKE ONE CAPSULE EACH MORNING BEFORE BREAKFAST (Patient taking differently: Take 1 capsule by mouth daily. At lunch) 90 capsule 1   vitamin C (ASCORBIC ACID) 500 MG tablet Take 1 tablet (500 mg total) by mouth 2 (two) times daily. 60 tablet 2   VITAMIN D PO Take 1 capsule by mouth in the morning.     lansoprazole (PREVACID) 30 MG capsule TAKE 1 CAPSULE EVERY DAY BEFORE SUPPER (Patient taking differently: Take 30 mg by mouth daily as needed.) 90 capsule 1   No facility-administered medications prior to visit.    Review of Systems;  Patient denies headache, fevers, malaise, unintentional weight loss, skin rash, eye pain, sinus congestion and sinus pain, sore throat, dysphagia,  hemoptysis , cough, dyspnea, wheezing, chest pain, palpitations, orthopnea, edema, abdominal pain, nausea, melena, diarrhea, constipation, flank pain, dysuria, hematuria, urinary  Frequency, nocturia, numbness, tingling, seizures,  Focal weakness, Loss of consciousness,  Tremor, insomnia, depression, anxiety, and suicidal ideation.      Objective:  BP (!) 122/54   Pulse 69   Temp (!) 97.5 F (36.4 C) (Oral)   Ht '5\' 1"'$  (1.549 m)   Wt 134 lb 12.8 oz (61.1 kg)   SpO2 97%   BMI 25.47 kg/m   BP Readings from Last 3 Encounters:  07/13/22 (!) 122/54  07/10/22 (!) 145/61  03/17/22 (!) 124/58    Wt Readings from Last 3 Encounters:  07/13/22 134 lb 12.8 oz (61.1 kg)  07/10/22 126 lb  1.7 oz (57.2 kg)  03/17/22 139 lb (63 kg)    Physical Exam Vitals reviewed.  Constitutional:      General: She is not in acute distress.    Appearance: Normal appearance. She is not ill-appearing, toxic-appearing or diaphoretic.  HENT:     Head: Normocephalic.  Eyes:     General: No scleral icterus.       Right eye: No discharge.        Left eye: No discharge.     Conjunctiva/sclera: Conjunctivae normal.  Cardiovascular:     Rate and Rhythm: Normal rate and regular rhythm.     Heart sounds: Normal heart sounds.  Pulmonary:     Effort: Pulmonary  effort is normal. No respiratory distress.     Breath sounds: Normal breath sounds.  Musculoskeletal:        General: Normal range of motion.  Skin:    General: Skin is warm and dry.  Neurological:     General: No focal deficit present.     Mental Status: She is alert and oriented to person, place, and time. Mental status is at baseline.  Psychiatric:        Mood and Affect: Mood normal.        Behavior: Behavior normal.        Thought Content: Thought content normal.        Judgment: Judgment normal.    Lab Results  Component Value Date   HGBA1C 7.3 (H) 06/30/2022   HGBA1C 7.5 (H) 01/04/2022   HGBA1C 7.5 (H) 04/19/2021    Lab Results  Component Value Date   CREATININE 1.87 (H) 07/10/2022   CREATININE 1.37 (H) 07/09/2022   CREATININE 1.24 (H) 07/08/2022    Lab Results  Component Value Date   WBC 11.3 (H) 07/09/2022   HGB 9.3 (L) 07/09/2022   HCT 28.4 (L) 07/09/2022   PLT 225 07/09/2022   GLUCOSE 261 (H) 07/10/2022   CHOL 134 04/19/2021   TRIG 245.0 (H) 04/19/2021   HDL 35.50 (L) 04/19/2021   LDLDIRECT 72.0 04/19/2021   LDLCALC 68 12/04/2016   ALT 15 07/07/2022   AST 18 07/07/2022   NA 132 (L) 07/10/2022   K 4.6 07/10/2022   CL 105 07/10/2022   CREATININE 1.87 (H) 07/10/2022   BUN 64 (H) 07/10/2022   CO2 18 (L) 07/10/2022   TSH 4.12 01/04/2022   INR 1.2 07/07/2022   HGBA1C 7.3 (H) 06/30/2022   MICROALBUR  5.8 (H) 01/04/2022    CT Angio Chest Pulmonary Embolism (PE) W or WO Contrast  Result Date: 07/08/2022 CLINICAL DATA:  Progressive shortness of breath.  PE suspected. EXAM: CT ANGIOGRAPHY CHEST WITH CONTRAST TECHNIQUE: Multidetector CT imaging of the chest was performed using the standard protocol during bolus administration of intravenous contrast. Multiplanar CT image reconstructions and MIPs were obtained to evaluate the vascular anatomy. RADIATION DOSE REDUCTION: This exam was performed according to the departmental dose-optimization program which includes automated exposure control, adjustment of the mA and/or kV according to patient size and/or use of iterative reconstruction technique. CONTRAST:  59m OMNIPAQUE IOHEXOL 350 MG/ML SOLN COMPARISON:  None Available. FINDINGS: Cardiovascular: Heart size upper normal. No substantial pericardial effusion. Coronary artery calcification is evident. Moderate atherosclerotic calcification is noted in the wall of the thoracic aorta. There is no filling defect within the opacified pulmonary arteries to suggest the presence of an acute pulmonary embolus. Mediastinum/Nodes: Upper normal to borderline enlarged mediastinal nodes evident including 11 mm short axis right paratracheal node on 27/4, 11 mm precarinal node on 43/4, and 12 mm short axis subcarinal node on 51/4. There is no hilar lymphadenopathy. The esophagus has normal imaging features. There is no axillary lymphadenopathy. Lungs/Pleura: Diffuse interstitial thickening noted in both lungs with patchy areas of ground-glass opacity bilaterally. Areas of interlobular septal thickening evident. No overtly suspicious pulmonary nodule or mass. Insert no pleural effusion Upper Abdomen: Unremarkable. Musculoskeletal: No worrisome lytic or sclerotic osseous abnormality. Review of the MIP images confirms the above findings. IMPRESSION: 1. No CT evidence for acute pulmonary embolus. 2. Diffuse interstitial thickening in  both lungs with patchy areas of ground-glass opacity bilaterally. Imaging features are nonspecific and may be related to edema or atypical infection.  3. Upper normal to borderline enlarged mediastinal nodes, likely reactive. Follow-up CT chest in 3 months recommended to ensure resolution. 4.  Aortic Atherosclerosis (ICD10-I70.0). Electronically Signed   By: Misty Stanley M.D.   On: 07/08/2022 11:17   ECHOCARDIOGRAM COMPLETE  Result Date: 07/07/2022    ECHOCARDIOGRAM REPORT   Patient Name:   AMDANDA ESWORTHY Date of Exam: 07/07/2022 Medical Rec #:  [id number]     Height:       61.0 in Accession #:    [id number]    Weight:       140.0 lb Date of Birth:  Jul 22, 1922    BSA:          1.623 m Patient Age:    69 years      BP:           160/63 mmHg Patient Gender: F             HR:           76 bpm. Exam Location:  [facility] Procedure: 2D Echo, Cardiac Doppler and Color Doppler Indications:     CHF  History:         Patient has prior history of Echocardiogram examinations, most                  recent 08/19/2020. CHF, CAD, Acute MI and Previous Myocardial                  Infarction; Risk Factors:Diabetes, Hypertension and                  Dyslipidemia.  Sonographer:     Wenda Low Referring Phys:  [id number] Maureen Morales Diagnosing Phys: Ida Rogue MD IMPRESSIONS  1. Left ventricular ejection fraction, by estimation, is 60 to 65%. The left ventricle has normal function. The left ventricle has no regional wall motion abnormalities. There is moderate left ventricular hypertrophy. Left ventricular diastolic parameters are consistent with Grade I diastolic dysfunction (impaired relaxation).  2. Right ventricular systolic function is normal. The right ventricular size is normal. There is mildly elevated pulmonary artery systolic pressure. The estimated right ventricular systolic pressure is 123XX123 mmHg.  3. Left atrial size was mildly dilated.  4. The mitral valve is normal in structure. Mild to moderate mitral valve  regurgitation. No evidence of mitral stenosis.  5. Tricuspid valve regurgitation is mild to moderate.  6. The aortic valve is normal in structure. There is moderate calcification of the aortic valve. Aortic valve regurgitation is not visualized. Moderate aortic valve stenosis. Aortic valve area, by VTI measures 1.16 cm. Aortic valve mean gradient measures 25.5 mmHg. Aortic valve Vmax measures 3.40 m/s.  7. The inferior vena cava is normal in size with greater than 50% respiratory variability, suggesting right atrial pressure of 3 mmHg. FINDINGS  Left Ventricle: Left ventricular ejection fraction, by estimation, is 60 to 65%. The left ventricle has normal function. The left ventricle has no regional wall motion abnormalities. The left ventricular internal cavity size was normal in size. There is  moderate left ventricular hypertrophy. Left ventricular diastolic parameters are consistent with Grade I diastolic dysfunction (impaired relaxation). Right Ventricle: The right ventricular size is normal. No increase in right ventricular wall thickness. Right ventricular systolic function is normal. There is mildly elevated pulmonary artery systolic pressure. The tricuspid regurgitant velocity is 2.90  m/s, and with an assumed right atrial pressure of 3 mmHg, the estimated right ventricular systolic pressure is 123XX123 mmHg. Left Atrium:  Left atrial size was mildly dilated. Right Atrium: Right atrial size was normal in size. Pericardium: There is no evidence of pericardial effusion. Mitral Valve: The mitral valve is normal in structure. Mild mitral annular calcification. Mild to moderate mitral valve regurgitation. No evidence of mitral valve stenosis. MV peak gradient, 7.6 mmHg. The mean mitral valve gradient is 3.0 mmHg. Tricuspid Valve: The tricuspid valve is normal in structure. Tricuspid valve regurgitation is mild to moderate. No evidence of tricuspid stenosis. Aortic Valve: The aortic valve is normal in structure. There  is moderate calcification of the aortic valve. Aortic valve regurgitation is not visualized. Moderate aortic stenosis is present. Aortic valve mean gradient measures 25.5 mmHg. Aortic valve peak gradient measures 46.3 mmHg. Aortic valve area, by VTI measures 1.16 cm. Pulmonic Valve: The pulmonic valve was normal in structure. Pulmonic valve regurgitation is mild. No evidence of pulmonic stenosis. Aorta: The aortic root is normal in size and structure. Venous: The inferior vena cava is normal in size with greater than 50% respiratory variability, suggesting right atrial pressure of 3 mmHg. IAS/Shunts: No atrial level shunt detected by color flow Doppler.  LEFT VENTRICLE PLAX 2D LVIDd:         3.30 cm   Diastology LVIDs:         1.80 cm   LV e' medial:    7.40 cm/s LV PW:         1.20 cm   LV E/e' medial:  13.8 LV IVS:        1.30 cm   LV e' lateral:   4.13 cm/s LVOT diam:     1.90 cm   LV E/e' lateral: 24.7 LV SV:         91 LV SV Index:   56 LVOT Area:     2.84 cm  RIGHT VENTRICLE RV Basal diam:  3.75 cm RV Mid diam:    3.30 cm RV S prime:     20.30 cm/s TAPSE (M-mode): 2.5 cm LEFT ATRIUM             Index        RIGHT ATRIUM           Index LA diam:        3.50 cm 2.16 cm/m   RA Area:     16.40 cm LA Vol (A2C):   82.6 ml 50.89 ml/m  RA Volume:   44.60 ml  27.48 ml/m LA Vol (A4C):   57.5 ml 35.43 ml/m LA Biplane Vol: 71.2 ml 43.87 ml/m  AORTIC VALVE                     PULMONIC VALVE AV Area (Vmax):    1.22 cm      PV Vmax:       1.49 m/s AV Area (Vmean):   1.04 cm      PV Peak grad:  8.9 mmHg AV Area (VTI):     1.16 cm AV Vmax:           340.25 cm/s AV Vmean:          228.500 cm/s AV VTI:            0.788 m AV Peak Grad:      46.3 mmHg AV Mean Grad:      25.5 mmHg LVOT Vmax:         146.00 cm/s LVOT Vmean:        83.500 cm/s LVOT VTI:  0.322 m LVOT/AV VTI ratio: 0.41  AORTA Ao Root diam: 3.10 cm Ao Asc diam:  2.90 cm MITRAL VALVE                TRICUSPID VALVE MV Area (PHT): 2.36 cm     TR Peak  grad:   33.6 mmHg MV Area VTI:   2.25 cm     TR Vmax:        290.00 cm/s MV Peak grad:  7.6 mmHg MV Mean grad:  3.0 mmHg     SHUNTS MV Vmax:       1.38 m/s     Systemic VTI:  0.32 m MV Vmean:      85.9 cm/s    Systemic Diam: 1.90 cm MV Decel Time: 321 msec MV E velocity: 102.00 cm/s MV A velocity: 121.00 cm/s MV E/A ratio:  0.84 Ida Rogue MD Electronically signed by Ida Rogue MD Signature Date/Time: 07/07/2022/6:11:17 PM    Final    DG Chest 2 View  Result Date: 07/07/2022 CLINICAL DATA:  Chest pain EXAM: CHEST - 2 VIEW COMPARISON:  CXR 08/26/20 FINDINGS: No pleural effusion. No pneumothorax. There are hazy bibasilar airspace opacities, which could represent atelectasis or infection. Unchanged cardiac and mediastinal contours. No radiographically apparent displaced rib fractures. Visualized upper abdomen unremarkable. Vertebral body heights are maintained. Aortic atherosclerotic calcifications. IMPRESSION: Hazy bibasilar airspace opacities, which could represent atelectasis or infection. No pleural effusion. Electronically Signed   By: Marin Roberts M.D.   On: 07/07/2022 10:23    Assessment & Plan:  .DNR (do not resuscitate) discussion -     Do not attempt resuscitation (DNR)  Metabolic acidosis Assessment & Plan: Gap of 14 (was 16 2 weeks ago ) Secondary to CKD   May be contributing to her malaise and fatigue. May benefit from alkanization,  either with sodium bicarbonate tablets  or calcium carbonate to keep HC03 at 23 or higher . Repeat BMET  urine lytes ordered  urine Ph is 5.5 today   Lab Results  Component Value Date   NA 132 (L) 07/10/2022   K 4.6 07/10/2022   CL 105 07/10/2022   CO2 18 (L) 07/10/2022     Orders: -     Basic metabolic panel -     Phosphorus -     Sodium, urine, random -     Potassium, urine, random -     Chloride, urine, random -     POCT Urinalysis Dipstick (Automated)  CKD stage G3b/A1, GFR 30-44 and albumin creatinine ratio <30 mg/g (HCC) -     PTH,  intact and calcium -     Microalbumin / creatinine urine ratio -     CBC with Differential/Platelet -     POCT Urinalysis Dipstick (Automated)  Black stools -     POC Hemoccult Bld/Stl (1-Cd Office Dx)  Iron deficiency anemia, unspecified iron deficiency anemia type -     POC Hemoccult Bld/Stl (1-Cd Office Dx)  Urinary incontinence, nocturnal enuresis Assessment & Plan: She tends to avoid drinking water because of her urge incontinence.  I have reiterated the need for adequate hydration . Continue Trospium   Unsteady gait Assessment & Plan: She is no longer encouraged to ambulate given her recurrent right leg fractures and uses a manual wheelchair 24/7   Type II diabetes mellitus with nephropathy (HCC) Assessment & Plan: Adequate control;  would benefit from SGLT 2 inhibitor lbut her urinary incontinence would be aggravated and likely increase her risk  for skin infections.  Continue glipizide,  No changes given given her age.   Lab Results  Component Value Date   HGBA1C 7.3 (H) 06/30/2022      Rheumatic aortic stenosis Assessment & Plan: moderate by ECHO done during hospitalization, now with mild to moderate mitral valve regurgitation.  [name]  Historically, attempts to lower BP to 130/80 or less have resulted in weakness and fatigue as she is currently reporting. Hctz has been discontinued; she received IV furosemide during hospitalization caused a decline in GFR .  Will suspend diuretics for now.    Hospital discharge follow-up Assessment & Plan: Patient is stable post discharge and has no new issues or questions about discharge plans at the visit today for hospital follow up. All labs , imaging studies and progress notes from admission were reviewed with patient today .  Follow up labs were drawn and follow up appointments with cardiology and pulmonology were confirmed  home health was ordered at discharge    Generalized anxiety disorder Assessment & Plan: Doscontinue  100  mg sertraline daily to manage excessive worrying which daughter has reported and results in elevated blood pressure    History of non-ST elevation myocardial infarction (NSTEMI) Assessment & Plan: She was admitted with a troponin of > 500 after having angina relieved with ntg x 1.  She was treated with IV heparin x 48 hours  and discharged on home dose of plavix.    Normocytic anemia Assessment & Plan: Iron deficiency noted,  FOBT was faintly positive.  Repeating CBC,  continue oral iron.  Given her age and medical comordities,  no workup is planned other than to increase PPI to bid    Other orders -     Lansoprazole; Take 1 capsule (30 mg total) by mouth 2 (two) times daily before a meal.  Dispense: 180 capsule; Refill: 0     Follow-up: No follow-ups on file.   [name], MD

## 2022-07-13 NOTE — Patient Instructions (Addendum)
Start giving Maureen Morales  lansoprazole two times daily before eating   I think the acid base issue may be contributing to her feeling weak.  The labs today will  help me decide how to treat

## 2022-07-14 LAB — CHLORIDE, URINE, RANDOM: Chloride Urine: 45 mmol/L (ref 32–290)

## 2022-07-14 LAB — SODIUM, URINE, RANDOM: Sodium, Ur: 47 mmol/L (ref 28–272)

## 2022-07-14 LAB — POTASSIUM, URINE, RANDOM: Potassium Urine: 13 mmol/L (ref 12–129)

## 2022-07-14 LAB — PTH, INTACT AND CALCIUM
Calcium: 9.6 mg/dL (ref 8.6–10.4)
PTH: 29 pg/mL (ref 16–77)

## 2022-07-19 ENCOUNTER — Telehealth: Payer: Self-pay | Admitting: Internal Medicine

## 2022-07-19 NOTE — Telephone Encounter (Signed)
Tiffany from [facility] called stating they received a referral for the pt and the family keeps putting them off so home health will try again tomorrow

## 2022-07-19 NOTE — Telephone Encounter (Signed)
FYI

## 2022-07-20 ENCOUNTER — Ambulatory Visit: Payer: Medicare Other | Attending: Cardiovascular Disease | Admitting: Cardiovascular Disease

## 2022-07-20 ENCOUNTER — Encounter: Payer: Self-pay | Admitting: Cardiovascular Disease

## 2022-07-20 VITALS — BP 100/50 | HR 64 | Ht 61.0 in | Wt 134.0 lb

## 2022-07-20 DIAGNOSIS — I5032 Chronic diastolic (congestive) heart failure: Secondary | ICD-10-CM | POA: Diagnosis not present

## 2022-07-20 DIAGNOSIS — I359 Nonrheumatic aortic valve disorder, unspecified: Secondary | ICD-10-CM | POA: Diagnosis not present

## 2022-07-20 DIAGNOSIS — I25118 Atherosclerotic heart disease of native coronary artery with other forms of angina pectoris: Secondary | ICD-10-CM | POA: Diagnosis not present

## 2022-07-20 DIAGNOSIS — E785 Hyperlipidemia, unspecified: Secondary | ICD-10-CM | POA: Insufficient documentation

## 2022-07-20 DIAGNOSIS — I10 Essential (primary) hypertension: Secondary | ICD-10-CM | POA: Insufficient documentation

## 2022-07-20 MED ORDER — HYDRALAZINE HCL 25 MG PO TABS: 25.0000 mg | ORAL_TABLET | Freq: Three times a day (TID) | ORAL | 2 refills | Status: DC

## 2022-07-20 NOTE — Progress Notes (Signed)
Cardiology Office Note   Date:  07/20/2022   ID:  Akshita, Rubenzer [birth year], MRN [id number]  PCP:  [name], MD  Cardiologist:   [name], MD   Chief Complaint  Patient presents with   Other    Post hosp. No complaints today. Meds reviewed verbally with pt.       History of Present Illness: CORDELLA STINNER is a 89 y.o. female who presents for a follow up regarding aortic stenosis and coronary artery disease. She has chronic medical conditions that include hypertension, hyperlipidemia and type 2 diabetes.  She was hospitalized in May of 2018 with non-ST elevation myocardial infarction in the setting of uncontrolled hypertension. Troponin peaked at 5.79. Echocardiogram showed hyperdynamic LV systolic function, mild aortic stenosis with mean gradient of 14 mmHg, mild mitral regurgitation, mild pulmonary hypertension and trivial posterior pericardial effusion. Cardiac catheterization showed mild to moderate nonobstructive coronary artery disease. Worst stenosis was 60% in the mid right coronary artery. Outpatient renal artery duplex showed no evidence of renal artery stenosis. She had right hip fracture in 2018 after a fall and required another surgery after another fall.    She has difficult to control blood pressure with intolerance to antihypertensive medications.  She was hospitalized in April of 2022 with non-ST elevation myocardial infarction in the setting of hypertensive urgency.  Peak high-sensitivity troponin was 1663.  Echocardiogram  showed an ejection fraction of 60 to 65%, moderate left ventricular hypertrophy, grade 1 diastolic dysfunction, mild to moderate mitral regurgitation and mild to moderate aortic stenosis with mean gradient of 16 mmHg and valve area of 1.7 cm.  Medical therapy was recommended with blood pressure control.    She was hospitalized recently with chest pain and shortness of breath and was found to have mildly elevated troponin.  She  was treated for atypical pneumonia.  Echocardiogram showed an EF of 60 to 65% with grade 1 diastolic dysfunction, mild pulmonary hypertension, mild to moderate mitral regurgitation and stable moderate aortic stenosis.  She was treated med clear.  No recurrent chest pain since hospital discharge but she has been feeling fatigued and her blood pressure has been on the low side.  She was also diagnosed with iron deficiency anemia and has been taking iron supplement.  Stool occult blood was positive but no overt bleeding.  Past Medical History:  Diagnosis Date   Aortic stenosis    a. 08/2016 Echo: Hyperdynamic LV fxn, mild AS (mean grad 64mmHg), mild MR, mild PAH; b. 05/2017 Echo: Hyperdynamic LV fxn, mod AS (mean grad 36mmHg, valve area 1.84). PASP 153mmHg.   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 08/2017 Renal U/S: No RAS.   Low back pain with left-sided sciatica 04/29/2018   Myocardial infarction [facility]) 2018   Non-obstructive CAD (coronary artery disease)    a. 08/2016 NSTEMI/Cath: mild to mod nonobs dzs including 60% mRCA stenosis-->Med rx.   NSTEMI (non-ST elevated  myocardial infarction) ([place]) 08/30/2016   NSTEMI (non-ST elevated myocardial infarction) ([place]) 08/18/2020   Nummular eczema 01/16/2011   Occurring on chest and under breast.  Treated by Dr Evorn Gong with fluocinonide 0.05% topical 06/26/12    Peripheral vascular disease ([place])    Periprosthetic fracture around internal prosthetic hip joint, initial encounter    PONV (postoperative nausea  and vomiting)    Positive H. pylori test    Pre-diabetes    Shingles rash 03/04/2020   Top of forehead and left forehead , with irritation of left sclera as well.  Seeing [facility] today    Suspected COVID-19 virus infection 01/10/2019   Patient reporting malaise and headache for the past 3 days,  Similar to presentation when she was positive for  COVID 19 INFECTION  July 2 .  Previous inection was caused by exposure to COVID positive caregiver.  Repeat scenario:  Same caregiver's mother tested positive on Monday and caregiver saw current patient on Tuesday     Viral gastroenteritis due to [place]-like agent 03/2011    Past Surgical History:  Procedure Laterality Date   ABDOMINAL HYSTERECTOMY     BLADDER SURGERY     CARDIAC CATHETERIZATION     CATARACT EXTRACTION W/PHACO Right 09/29/2020   Procedure: CATARACT EXTRACTION PHACO AND INTRAOCULAR LENS PLACEMENT ([place]) RIGHT VISION BLUE 7.87 00:58.7 ;  Surgeon: Leandrew Koyanagi, MD;  Location: [place];  Service: Ophthalmology;  Laterality: Right;   CATARACT EXTRACTION W/PHACO Left 10/13/2020   Procedure: CATARACT EXTRACTION PHACO AND INTRAOCULAR LENS PLACEMENT ([place]) LEFT VISION BLUE;  Surgeon: Leandrew Koyanagi, MD;  Location: [place];  Service: Ophthalmology;  Laterality: Left;  4.94 1:03.0 7.9%   CHOLECYSTECTOMY     COLONOSCOPY     06/16/1987, 02/20/1995, 05/04/1999, 02/14/2005, 05/17/2007   ESOPHAGOGASTRODUODENOSCOPY     05/11/1987, 06/13/1995, 10/09/1995, 05/04/1999, 01/16/2002   FLEXIBLE SIGMOIDOSCOPY N/A 05/02/2017   Procedure: Otho Darner SIGMOIDOSCOPY;  Surgeon: Manya Silvas, MD;  Location: [facility] ENDOSCOPY;  Service: Endoscopy;  Laterality: N/A;   HEMORRHOID SURGERY N/A 05/14/2017   Procedure: EXTERNAL THROMBOSED HEMORRHOIDECTOMY;  Surgeon: Robert Bellow, MD;  Location: [facility];  Service: General;  Laterality: N/A;   HIP ARTHROPLASTY Right 02/17/2017   Procedure: ARTHROPLASTY BIPOLAR HIP  (HEMIARTHROPLASTY);  Surgeon: Claud Kelp, MD;  Location: [facility];  Service: Orthopedics;  Laterality: Right;   LEFT HEART CATH AND CORONARY ANGIOGRAPHY N/A 08/31/2016   Procedure: Left Heart Cath and Coronary Angiography;  Surgeon: Wellington Hampshire, MD;  Location: [facility];  Service: Cardiovascular;  Laterality: N/A;   ORIF PERIPROSTHETIC FRACTURE Right 05/27/2017   ORIF PERIPROSTHETIC FRACTURE Right 05/27/2017   Procedure: OPEN REDUCTION INTERNAL FIXATION (ORIF) PERIPROSTHETIC FRACTURE;  Surgeon: Mcarthur Rossetti, MD;  Location: [place];  Service: Orthopedics;  Laterality: Right;   TOTAL HIP ARTHROPLASTY Right    uterian prolapse       Current Outpatient Medications  Medication Sig Dispense Refill   calcium-vitamin D (OSCAL WITH D) 500-5 MG-MCG tablet Take 1 tablet by mouth 2 (two) times daily. 60 tablet 11   acetaminophen (TYLENOL) 500 MG tablet Take 1,000 mg by mouth every 6 (six) hours as needed for moderate pain or headache.     albuterol (PROVENTIL) (2.5 MG/3ML) 0.083% nebulizer solution Take 3 mLs (2.5 mg total) by nebulization every 6 (six) hours as needed for wheezing or shortness of breath. 75 mL 12   albuterol (VENTOLIN HFA) 108 (90 Base) MCG/ACT inhaler Inhale 2 puffs into the lungs every  6 (six) hours as needed for wheezing or shortness of breath. 18 g 3   atorvastatin (LIPITOR) 40 MG tablet TAKE 1 TABLET BY MOUTH DAILY (Patient taking differently: Take 40 mg by mouth at bedtime.) 90 tablet 1   carvedilol (COREG) 6.25 MG tablet Take 1 tablet (6.25 mg total) by mouth 2 (two) times daily with a meal. 180 tablet 3   clopidogrel (PLAVIX) 75 MG tablet TAKE ONE TABLET BY MOUTH EVERY DAY 90 tablet 1   CONTOUR NEXT TEST test strip USE AS DIRECTED TWICE A DAY 100 each 5   COVID-19 Home Collection Test KIT Use as needed for signs and symptoms of COVID INFECTION 4 kit 1   CRANBERRY PO Take 2 capsules by mouth 2 (two) times daily.     famotidine (PEPCID) 10 MG tablet Take  10 mg by mouth daily as needed for heartburn or indigestion.     glipiZIDE (GLUCOTROL) 5 MG tablet TAKE ONE TABLET TWICE A DAY BEFORE MEALS 180 tablet 1   hydrALAZINE (APRESOLINE) 25 MG tablet TAKE THREE TABLETS BY MOUTH WITH BREAKFAST  TAKE THREE TABLETS WITH LUNCHAND TAKE TWO TABLETS WITH DINNER AS DIRECTED 360 tablet 1   Iron, Ferrous Sulfate, 325 (65 Fe) MG TABS Take 325 mg by mouth daily. 90 tablet 0   isosorbide mononitrate (IMDUR) 30 MG 24 hr tablet TAKE ONE TABLET BY MOUTH EVERY MORNING AND TAKE TWO TABLETS EVERY EVENING 270 tablet 1   Lactobacillus (ULTIMATE PROBIOTIC FORMULA) CAPS Take 1 capsule by mouth daily with supper.     lansoprazole (PREVACID) 30 MG capsule Take 1 capsule (30 mg total) by mouth 2 (two) times daily before a meal. 180 capsule 0   loratadine (CLARITIN) 10 MG tablet Take 10 mg daily by mouth.      losartan (COZAAR) 100 MG tablet TAKE ONE TABLET BY MOUTH EVERY DAY 90 tablet 1   nitroGLYCERIN (NITROSTAT) 0.4 MG SL tablet Place 1 tablet (0.4 mg total) under the tongue every 5 (five) minutes as needed for chest pain. 10 tablet 2   sertraline (ZOLOFT) 100 MG tablet TAKE 1 TABLET BY MOUTH DAILY. 30 tablet 3   Trospium Chloride 60 MG CP24 TAKE ONE CAPSULE EACH MORNING BEFORE BREAKFAST (Patient taking differently: Take 1 capsule by mouth daily. At lunch) 90 capsule 1   vitamin C (ASCORBIC ACID) 500 MG tablet Take 1 tablet (500 mg total) by mouth 2 (two) times daily. 60 tablet 2   VITAMIN D PO Take 1 capsule by mouth in the morning.     No current facility-administered medications for this visit.    Allergies:   Clindamycin, Oxytetracycline, Phenobarbital, Atropine, Belladonna alkaloids, Codeine, Darvon [propoxyphene], Demerol [meperidine], Methocarbamol, Penicillins, Sulfa antibiotics, and Iron    Social History:  The patient  reports that she has never smoked. She has never used smokeless tobacco. She reports that she does not drink alcohol and does not use drugs.    Family History:  The patient's family history includes Breast cancer (age of onset: 33) in her sister; Breast cancer (age of onset: 56) in her mother; Cancer in her mother; Colon cancer in her brother, brother, and father; Stroke in her sister.    ROS:  Please see the history of present illness.   Otherwise, review of systems are positive for none.   All other systems are reviewed and negative.    PHYSICAL EXAM: VS:  BP (!) 100/50 (BP Location: Left Arm, Patient Position: Sitting, Cuff Size: Normal)   Pulse  64   Ht 5\' 1"  (1.549 m)   Wt 134 lb (60.8 kg)   SpO2 98%   BMI 25.32 kg/m  , BMI Body mass index is 25.32 kg/m. GEN: Well nourished, well developed, in no acute distress  HEENT: normal  Neck: no JVD, carotid bruits, or masses Cardiac: RRR; no rubs, or gallops, mild bilateral leg edema . 3/6 crescendo decrescendo systolic murmur in the aortic area which is mid peaking Respiratory:  clear to auscultation bilaterally, normal work of breathing GI: soft, nontender, nondistended, + BS MS: no deformity or atrophy  Skin: warm and dry, no rash Neuro:  Strength and sensation are intact Psych: euthymic mood, full affect    EKG:  EKG is ordered today. The ekg ordered today demonstrates normal sinus rhythm, LVH with repolarization abnormalities.   Recent Labs: 01/04/2022: TSH 4.12 06/30/2022: Pro B Natriuretic peptide (BNP) 228.0 07/07/2022: ALT 15; B Natriuretic Peptide 300.1 07/13/2022: BUN 59; Creatinine, Ser 1.35; Hemoglobin 9.8; Platelets 265.0; Potassium 4.2; Sodium 133    Lipid Panel    Component Value Date/Time   CHOL 134 04/19/2021 0951   TRIG 245.0 (H) 04/19/2021 0951   HDL 35.50 (L) 04/19/2021 0951   CHOLHDL 4 04/19/2021 0951   VLDL 49.0 (H) 04/19/2021 0951   LDLCALC 68 12/04/2016 0808   LDLDIRECT 72.0 04/19/2021 0951      Wt Readings from Last 3 Encounters:  07/20/22 134 lb (60.8 kg)  07/13/22 134 lb 12.8 oz (61.1 kg)  07/10/22 126 lb 1.7 oz (57.2 kg)            No data to display            ASSESSMENT AND PLAN:  1.  Coronary artery disease involving native coronary arteries with stable angina: Recent small non-ST elevation myocardial infarction.  No plans for invasive cardiac evaluation considering her age and comorbidities.  Continue long-term treatment with clopidogrel.  Will monitor hemoglobin and if she remains anemic, we might need to consider stopping antiplatelet medications.  2.  Moderate aortic stenosis.  Fortunately, this was stable on recent echocardiogram.   3.  Essential hypertension: Blood pressure has been on the low side since hospital discharge and she has been fatigued.  Low blood pressure is not ideal in the setting of moderate aortic stenosis.  I elected to decrease hydralazine to 25 mg 3 times a day.   4.  Hyperlipidemia: Continue treatment with atorvastatin 40 mg once daily.  Most recent lipid profile showed an LDL of 72  5.  Chronic diastolic heart failure: She did receive IV furosemide in the hospital but had worsening acute on chronic kidney disease.  She was discharged home off diuretics.  She appears to be euvolemic and subsequent labs showed improvement in her creatinine to baseline.    Disposition:   FU with me in 3 months.  Signed,  [name], MD  07/20/2022 11:18 AM    [place]

## 2022-07-20 NOTE — Patient Instructions (Signed)
Medication Instructions:  DECREASE the Hydralazine to 25 mg three times daily  *If you need a refill on your cardiac medications before your next appointment, please call your pharmacy*   Lab Work: None ordered If you have labs (blood work) drawn today and your tests are completely normal, you will receive your results only by: [place] (if you have MyChart) OR A paper copy in the mail If you have any lab test that is abnormal or we need to change your treatment, we will call you to review the results.   Testing/Procedures: None ordered   Follow-Up: At [facility], you and your health needs are our priority.  As part of our continuing mission to provide you with exceptional heart care, we have created designated Provider Care Teams.  These Care Teams include your primary Cardiologist (physician) and Advanced Practice Providers (APPs -  Physician Assistants and Nurse Practitioners) who all work together to provide you with the care you need, when you need it.  We recommend signing up for the patient portal called "MyChart".  Sign up information is provided on this After Visit Summary.  MyChart is used to connect with patients for Virtual Visits (Telemedicine).  Patients are able to view lab/test results, encounter notes, upcoming appointments, etc.  Non-urgent messages can be sent to your provider as well.   To learn more about what you can do with MyChart, go to [email or web address].    Your next appointment:   3 month(s)  Provider:   You may see [name], MD or one of the following Advanced Practice Providers on your designated Care Team:   Murray Hodgkins, NP Christell Faith, PA-C Cadence Kathlen Mody, PA-C Gerrie Nordmann, NP

## 2022-08-08 ENCOUNTER — Institutional Professional Consult (permissible substitution): Payer: Medicare Other | Admitting: Student in an Organized Health Care Education/Training Program

## 2022-08-10 ENCOUNTER — Inpatient Hospital Stay: Payer: Medicare Other | Admitting: Pulmonary Disease

## 2022-08-29 ENCOUNTER — Other Ambulatory Visit: Payer: Self-pay | Admitting: Cardiovascular Disease

## 2022-08-31 ENCOUNTER — Other Ambulatory Visit: Payer: Self-pay | Admitting: Internal Medicine

## 2022-09-06 ENCOUNTER — Other Ambulatory Visit: Payer: Self-pay | Admitting: Internal Medicine

## 2022-09-11 ENCOUNTER — Ambulatory Visit (INDEPENDENT_AMBULATORY_CARE_PROVIDER_SITE_OTHER): Payer: Medicare Other | Admitting: Pulmonary Disease

## 2022-09-11 ENCOUNTER — Encounter: Payer: Self-pay | Admitting: Pulmonary Disease

## 2022-09-11 VITALS — BP 122/64 | HR 72 | Temp 97.6°F | Ht 61.0 in | Wt 136.4 lb

## 2022-09-11 DIAGNOSIS — I5032 Chronic diastolic (congestive) heart failure: Secondary | ICD-10-CM | POA: Diagnosis not present

## 2022-09-11 DIAGNOSIS — R0602 Shortness of breath: Secondary | ICD-10-CM | POA: Diagnosis not present

## 2022-09-11 DIAGNOSIS — J849 Interstitial pulmonary disease, unspecified: Secondary | ICD-10-CM | POA: Diagnosis not present

## 2022-09-11 DIAGNOSIS — I080 Rheumatic disorders of both mitral and aortic valves: Secondary | ICD-10-CM | POA: Diagnosis not present

## 2022-09-11 NOTE — Progress Notes (Unsigned)
Subjective:    Patient ID: Maureen Morales, female    DOB: [birth year], 89 y.o.   MRN: [id number]  Patient Care Team: Sherlene Shams, MD as PCP - General (Internal Medicine) Iran Ouch, MD as PCP - Cardiology (Cardiology)  Chief Complaint  Patient presents with   Hospitalization Follow-up    Breathing has improved since discharge. C/o wheezing.    HPI Maureen Morales is a delightful 89 year old lifelong never smoker who presents for follow-up after hospitalization at [facility] between 07 July 2022 through 10 July 2022.  I last saw Maureen Morales on 02 October 2019 at that time she was being followed for a cough which eventually resolved.  During her recent admission in March the patient presented at that time with chest pain and associated shortness of breath.  Patient was noted to have hazy bilateral airspace opacities elevated troponin and modestly elevated BNP.  The patient was treated for suspected acute on chronic diastolic congestive heart failure and responded well to diuretics.  Chest CT performed at that time was consistent with pulmonary edema.  She did get a short course of empiric steroids and azithromycin as the findings could also be an inflammatory change.  However the most dramatic response in the patient's symptomatology was from diuretics.  She also was noted to be quite hypertensive during that admission and her antihypertensives were optimized.  She was also noted to be anemic and received Venofer.  He has a known iron deficiency anemia.  Today she presents with no respiratory complaints.  She has been doing well.  We reviewed the findings of her CT with her, her daughter and her caretaker that were with her today.  She has not had any fevers, chills or sweats since discharge from the hospital, no chest pain, no lower extremity edema.  No cough or sputum production.  Overall she feels well and looks well.   Review of Systems A 10 point review of systems was performed and it is as noted above  otherwise negative.  Past Medical History:  Diagnosis Date   Aortic stenosis    a. 08/2016 Echo: Hyperdynamic LV fxn, mild AS (mean grad ), mild MR, mild PAH; b. 05/2017 Echo: Hyperdynamic LV fxn, mod AS (mean grad , valve area 1.84). PASP .   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 08/2017 Renal U/S: No RAS.   Low back pain with left-sided sciatica 04/29/2018   Myocardial infarction [facility]) 2018   Non-obstructive CAD (coronary artery disease)    a. 08/2016 NSTEMI/Cath: mild to mod nonobs dzs including 60% mRCA stenosis-->Med rx.   NSTEMI (non-ST elevated myocardial infarction) (HCC) 08/30/2016   NSTEMI (non-ST elevated myocardial infarction) (HCC) 08/18/2020   Nummular eczema 01/16/2011   Occurring on chest and under breast.  Treated by Dr Adolphus Birchwood with fluocinonide 0.05% topical 06/26/12    Peripheral vascular disease (HCC)    Periprosthetic fracture around internal prosthetic hip joint, initial encounter  PONV (postoperative nausea and vomiting)    Positive H. pylori test    Pre-diabetes    Shingles rash 03/04/2020   Top of forehead and left forehead , with irritation of left sclera as well.  Seeing [facility] today    Suspected COVID-19 virus infection 01/10/2019   Patient reporting malaise and headache for the past 3 days,  Similar to presentation when she was positive for  COVID 19 INFECTION  July 2 .  Previous inection was caused by exposure to COVID positive caregiver.  Repeat scenario:  Same caregiver's mother tested  positive on Monday and caregiver saw current patient on Tuesday     Viral gastroenteritis due to [place]-like agent 03/2011   Past Surgical History:  Procedure Laterality Date   ABDOMINAL HYSTERECTOMY     BLADDER SURGERY     CARDIAC CATHETERIZATION     CATARACT EXTRACTION W/PHACO Right 09/29/2020   Procedure: CATARACT EXTRACTION PHACO AND INTRAOCULAR LENS PLACEMENT (IOC) RIGHT VISION BLUE 7.87 00:58.7 ;  Surgeon: Lockie Mola, MD;  Location: [facility];  Service: Ophthalmology;  Laterality: Right;   CATARACT EXTRACTION W/PHACO Left 10/13/2020   Procedure: CATARACT EXTRACTION PHACO AND INTRAOCULAR LENS PLACEMENT (IOC) LEFT VISION BLUE;  Surgeon: Lockie Mola, MD;  Location: [facility];  Service: Ophthalmology;  Laterality: Left;  4.94 1:03.0 7.9%   CHOLECYSTECTOMY     COLONOSCOPY     06/16/1987, 02/20/1995, 05/04/1999, 02/14/2005, 05/17/2007   ESOPHAGOGASTRODUODENOSCOPY     05/11/1987, 06/13/1995, 10/09/1995, 05/04/1999, 01/16/2002   FLEXIBLE SIGMOIDOSCOPY N/A 05/02/2017   Procedure: Wenda Low SIGMOIDOSCOPY;  Surgeon: Scot Jun, MD;  Location: [facility] ENDOSCOPY;  Service: Endoscopy;  Laterality: N/A;   HEMORRHOID SURGERY N/A 05/14/2017   Procedure: EXTERNAL THROMBOSED HEMORRHOIDECTOMY;  Surgeon: Earline Mayotte, MD;  Location: [facility] ORS;  Service: General;  Laterality: N/A;   HIP ARTHROPLASTY Right 02/17/2017   Procedure: ARTHROPLASTY BIPOLAR HIP (HEMIARTHROPLASTY);  Surgeon: Danelle Earthly, MD;  Location: [facility] ORS;  Service: Orthopedics;  Laterality: Right;   LEFT HEART CATH AND CORONARY ANGIOGRAPHY N/A 08/31/2016   Procedure: Left Heart Cath and Coronary Angiography;  Surgeon: Iran Ouch, MD;  Location: [facility] INVASIVE CV LAB;  Service: Cardiovascular;  Laterality: N/A;   ORIF PERIPROSTHETIC FRACTURE Right 05/27/2017   ORIF PERIPROSTHETIC FRACTURE Right 05/27/2017   Procedure: OPEN REDUCTION INTERNAL FIXATION (ORIF) PERIPROSTHETIC FRACTURE;  Surgeon:  Kathryne Hitch, MD;  Location: [facility] OR;  Service: Orthopedics;  Laterality: Right;   TOTAL HIP ARTHROPLASTY Right    uterian prolapse     Patient Active Problem List   Diagnosis Date Noted   Metabolic acidosis 07/13/2022   History of non-ST elevation myocardial infarction (NSTEMI) 07/13/2022   Acute decompensated heart failure (HCC) 07/08/2022   NSTEMI (non-ST elevated myocardial infarction) (HCC) 07/07/2022   Demand ischemia 07/07/2022   Iron deficiency 07/02/2022   Bronchitis 11/24/2021   Urinary incontinence, nocturnal enuresis 01/17/2020   Chest pain 07/17/2019   Elevated ALT measurement 07/17/2019   Moderate tricuspid valve regurgitation 09/03/2017   Diastolic dysfunction without heart failure 09/03/2017   Labile blood pressure    CKD stage G3b/A1, GFR 30-44 and albumin creatinine ratio <30 mg/g (HCC)    Chronic diastolic congestive heart failure (HCC)    History of right hip hemiarthroplasty    Diabetes mellitus type 2 in nonobese (HCC)    AKI (acute kidney injury) (HCC)    Recurrent falls 05/26/2017   Normocytic anemia 05/26/2017   CAD (coronary artery disease) 05/26/2017   Generalized anxiety  disorder 09/07/2016   Unsteady gait 09/07/2016   Hospital discharge follow-up 09/07/2016   S/P laparoscopic cholecystectomy 12/15/2013   Encounter for preventive health examination 11/13/2013   Spondylosis of cervical spine 01/27/2013   Anemia, iron deficiency 01/21/2013   Rheumatic aortic stenosis 05/06/2012   Chronic interstitial lung disease (HCC) 07/04/2011   Hemorrhoids, internal 04/05/2011   Essential hypertension    Type II diabetes mellitus with nephropathy (HCC)    Hyperlipidemia    Family History  Problem Relation Age of Onset   Breast cancer Mother 80   Colon cancer Father    Stroke Sister    Cancer Mother    Breast cancer Sister 63   Colon cancer Brother    Colon cancer Brother    Social History   Tobacco Use   Smoking status: Never   Smokeless  tobacco: Never  Substance Use Topics   Alcohol use: No   Allergies  Allergen Reactions   Clindamycin     dizziness   Oxytetracycline Hives   Phenobarbital Hives   Atropine Other (See Comments)    Reaction: unknown   Belladonna Alkaloids Other (See Comments)    Reaction: unknown   Codeine Nausea And Vomiting   Darvon [Propoxyphene] Nausea And Vomiting   Demerol [Meperidine] Nausea And Vomiting   Methocarbamol Other (See Comments)    Reaction: unknown   Penicillins Other (See Comments)    Pt and family unsure of details.  Can tolerate cephalosporins   Sulfa Antibiotics Other (See Comments)    Reaction: unknown   Iron Other (See Comments)    High dose prescription gives her diarrhea   Current Meds  Medication Sig   acetaminophen (TYLENOL) 500 MG tablet Take 1,000 mg by mouth every 6 (six) hours as needed for moderate pain or headache.   albuterol (PROVENTIL) (2.5 MG/3ML) 0.083% nebulizer solution Take 3 mLs (2.5 mg total) by nebulization every 6 (six) hours as needed for wheezing or shortness of breath.   albuterol (VENTOLIN HFA) 108 (90 Base) MCG/ACT inhaler Inhale 2 puffs into the lungs every 6 (six) hours as needed for wheezing or shortness of breath.   atorvastatin (LIPITOR) 40 MG tablet TAKE 1 TABLET BY MOUTH DAILY   carvedilol (COREG) 6.25 MG tablet Take 1 tablet (6.25 mg total) by mouth 2 (two) times daily with a meal.   clopidogrel (PLAVIX) 75 MG tablet TAKE ONE TABLET BY MOUTH EVERY DAY   CONTOUR NEXT TEST test strip USE AS DIRECTED TWICE A DAY   COVID-19 Home Collection Test KIT Use as needed for signs and symptoms of COVID INFECTION   CRANBERRY PO Take 2 capsules by mouth 2 (two) times daily.   famotidine (PEPCID) 10 MG tablet Take 10 mg by mouth daily as needed for heartburn or indigestion.   glipiZIDE (GLUCOTROL) 5 MG tablet TAKE ONE TABLET TWICE A DAY BEFORE MEALS   hydrALAZINE (APRESOLINE) 25 MG tablet Take 1 tablet (25 mg total) by mouth 3 (three) times daily.    Iron, Ferrous Sulfate, 325 (65 Fe) MG TABS Take 325 mg by mouth daily.   isosorbide mononitrate (IMDUR) 30 MG 24 hr tablet TAKE ONE TABLET BY MOUTH EVERY MORNING AND TWO TABLETS EVERY EVENING   Lactobacillus (ULTIMATE PROBIOTIC FORMULA) CAPS Take 1 capsule by mouth daily with supper.   lansoprazole (PREVACID) 30 MG capsule Take 1 capsule (30 mg total) by mouth 2 (two) times daily before a meal.   loratadine (CLARITIN) 10 MG tablet Take 10 mg daily by mouth.  losartan (COZAAR) 100 MG tablet TAKE ONE TABLET BY MOUTH EVERY DAY   nitroGLYCERIN (NITROSTAT) 0.4 MG SL tablet Place 1 tablet (0.4 mg total) under the tongue every 5 (five) minutes as needed for chest pain.   sertraline (ZOLOFT) 100 MG tablet TAKE 1 TABLET BY MOUTH DAILY.   Trospium Chloride 60 MG CP24 TAKE ONE CAPSULE EACH MORNING BEFORE BREAKFAST (Patient taking differently: Take 1 capsule by mouth daily. At lunch)   vitamin C (ASCORBIC ACID) 500 MG tablet Take 1 tablet (500 mg total) by mouth 2 (two) times daily.   VITAMIN D PO Take 1 capsule by mouth in the morning.   Immunization History  Administered Date(s) Administered   Influenza Split 02/01/2011   Influenza, High Dose Seasonal PF 02/27/2018, 02/18/2019, 02/21/2021   Influenza, Seasonal, Injecte, Preservative Fre 02/14/2017   Influenza,inj,Quad PF,6+ Mos 01/08/2013, 12/28/2014   Influenza-Unspecified 01/30/2012, 01/29/2014, 01/25/2016   PFIZER(Purple Top)SARS-COV-2 Vaccination 06/16/2019, 07/15/2019, 02/23/2020   Pneumococcal Conjugate-13 07/31/2013, 09/07/2014   Pneumococcal Polysaccharide-23 08/01/2006, 02/18/2017   Tdap 05/17/2008, 08/20/2019   Zoster, Live 11/14/2010       Objective:   Physical Exam BP 122/64 (BP Location: Left Arm, Cuff Size: Normal)   Pulse 72   Temp 97.6 F (36.4 C) (Temporal)   Ht 5\' 1"  (1.549 m)   Wt 136 lb 6.4 oz (61.9 kg)   SpO2 97%   BMI 25.77 kg/m   SpO2: 97 % O2 Device: None (Room air)  GENERAL: Awake and alert, interactive.   She presents in a transport chair.  No conversational dyspnea.  No distress. HEAD: Normocephalic, atraumatic.  EYES: Pupils equal, round, reactive to light.  No scleral icterus.  MOUTH: Dentition for the most part intact, few chipped teeth. NECK: Supple. No thyromegaly. Trachea midline. No JVD.  No adenopathy. PULMONARY: Lungs clear to auscultation bilaterally.  Moving air well. CARDIOVASCULAR: S1 and S2. Regular rate and rhythm.  Mitral regurgitation murmur 2/6, aortic stenosis murmur 2-3/6. GASTROINTESTINAL: Benign. MUSCULOSKELETAL: No joint deformity, no clubbing, no edema.  NEUROLOGIC: No focal deficits noted.  Fluent speech.  No vocal hoarseness noted. SKIN: Intact,warm,dry. PSYCH: Mood and behavior appropriate.     Assessment & Plan:     ICD-10-CM   1. ILD (interstitial lung disease) (HCC) vs edema  J84.9 CT CHEST WO CONTRAST    Pulse oximetry, overnight   Repeat CT chest ordered    2. Mitral regurgitation and aortic stenosis  I08.0    This issue adds complexity to her management Follows with cardiology    3. Chronic diastolic congestive heart failure (HCC)  I50.32    Suspect decompensation led to admission Continue follow-up with cardiology    4. Shortness of breath  R06.02    Markedly improved post hospitalization Assess overnight oximetry given diastolic dysfunction     Orders Placed This Encounter  Procedures   CT CHEST WO CONTRAST    Standing Status:   Future    Standing Expiration Date:   09/11/2023    Order Specific Question:   Preferred imaging location?    Answer:   [facility]   Pulse oximetry, overnight    Room air DME:New start    Standing Status:   Future    Standing Expiration Date:   09/11/2023   Appropriate testing has been ordered.  Will see the patient in follow-up in 2 to 3 months time she is to call sooner should any new problems arise.  Gailen Shelter, MD Advanced Bronchoscopy PCCM [facility]    *  This note was  dictated using voice recognition software/Dragon.  Despite best efforts to proofread, errors can occur which can change the meaning. Any transcriptional errors that result from this process are unintentional and may not be fully corrected at the time of dictation.

## 2022-09-11 NOTE — Patient Instructions (Signed)
We are going to check another CT of the chest to make sure the findings you had in March have clear completely.  We are going to check a oxygen level at nighttime while you sleep.  This will let us know if you need oxygen at nighttime.  We will see you in follow-up in 2 to 3 months time call sooner should any new problems arise.

## 2022-09-14 ENCOUNTER — Encounter: Payer: Self-pay | Admitting: Pulmonary Disease

## 2022-09-20 ENCOUNTER — Ambulatory Visit: Payer: Medicare Other | Admitting: Cardiovascular Disease

## 2022-09-29 ENCOUNTER — Other Ambulatory Visit: Payer: Self-pay | Admitting: Internal Medicine

## 2022-10-06 DIAGNOSIS — D2272 Melanocytic nevi of left lower limb, including hip: Secondary | ICD-10-CM | POA: Diagnosis not present

## 2022-10-06 DIAGNOSIS — D485 Neoplasm of uncertain behavior of skin: Secondary | ICD-10-CM | POA: Diagnosis not present

## 2022-10-06 DIAGNOSIS — D225 Melanocytic nevi of trunk: Secondary | ICD-10-CM | POA: Diagnosis not present

## 2022-10-06 DIAGNOSIS — J849 Interstitial pulmonary disease, unspecified: Secondary | ICD-10-CM | POA: Diagnosis not present

## 2022-10-06 DIAGNOSIS — L821 Other seborrheic keratosis: Secondary | ICD-10-CM | POA: Diagnosis not present

## 2022-10-06 DIAGNOSIS — D2261 Melanocytic nevi of right upper limb, including shoulder: Secondary | ICD-10-CM | POA: Diagnosis not present

## 2022-10-06 DIAGNOSIS — D2262 Melanocytic nevi of left upper limb, including shoulder: Secondary | ICD-10-CM | POA: Diagnosis not present

## 2022-10-06 DIAGNOSIS — D2271 Melanocytic nevi of right lower limb, including hip: Secondary | ICD-10-CM | POA: Diagnosis not present

## 2022-10-09 ENCOUNTER — Telehealth: Payer: Self-pay

## 2022-10-09 NOTE — Telephone Encounter (Signed)
ONO reviewed by Dr. Jayme Cloud- patient does not need O2. SpO2 only dropped to 89%.  I have notified the patient's daughter([name]-DPR).  Nothing further needed.

## 2022-10-10 ENCOUNTER — Other Ambulatory Visit: Payer: Self-pay | Admitting: Internal Medicine

## 2022-10-10 ENCOUNTER — Ambulatory Visit (INDEPENDENT_AMBULATORY_CARE_PROVIDER_SITE_OTHER): Payer: Medicare Other

## 2022-10-10 ENCOUNTER — Other Ambulatory Visit: Payer: Medicare Other

## 2022-10-10 ENCOUNTER — Ambulatory Visit: Payer: Medicare Other

## 2022-10-10 DIAGNOSIS — M25551 Pain in right hip: Secondary | ICD-10-CM | POA: Diagnosis not present

## 2022-10-10 DIAGNOSIS — M79604 Pain in right leg: Secondary | ICD-10-CM

## 2022-10-10 DIAGNOSIS — Z96641 Presence of right artificial hip joint: Secondary | ICD-10-CM | POA: Diagnosis not present

## 2022-10-12 ENCOUNTER — Ambulatory Visit: Payer: Medicare Other | Attending: Cardiovascular Disease | Admitting: Cardiovascular Disease

## 2022-10-12 ENCOUNTER — Encounter: Payer: Self-pay | Admitting: Cardiovascular Disease

## 2022-10-12 VITALS — BP 110/50 | HR 69 | Ht 61.0 in | Wt 134.8 lb

## 2022-10-12 DIAGNOSIS — E785 Hyperlipidemia, unspecified: Secondary | ICD-10-CM | POA: Insufficient documentation

## 2022-10-12 DIAGNOSIS — I359 Nonrheumatic aortic valve disorder, unspecified: Secondary | ICD-10-CM | POA: Insufficient documentation

## 2022-10-12 DIAGNOSIS — I5032 Chronic diastolic (congestive) heart failure: Secondary | ICD-10-CM | POA: Diagnosis not present

## 2022-10-12 DIAGNOSIS — I10 Essential (primary) hypertension: Secondary | ICD-10-CM | POA: Diagnosis not present

## 2022-10-12 DIAGNOSIS — I25118 Atherosclerotic heart disease of native coronary artery with other forms of angina pectoris: Secondary | ICD-10-CM | POA: Insufficient documentation

## 2022-10-12 NOTE — Patient Instructions (Signed)
Medication Instructions:  No changes *If you need a refill on your cardiac medications before your next appointment, please call your pharmacy*   Lab Work: None ordered If you have labs (blood work) drawn today and your tests are completely normal, you will receive your results only by: MyChart Message (if you have MyChart) OR A paper copy in the mail If you have any lab test that is abnormal or we need to change your treatment, we will call you to review the results.   Testing/Procedures: None ordered   Follow-Up: At [facility], you and your health needs are our priority.  As part of our continuing mission to provide you with exceptional heart care, we have created designated Provider Care Teams.  These Care Teams include your primary Cardiologist (physician) and Advanced Practice Providers (APPs -  Physician Assistants and Nurse Practitioners) who all work together to provide you with the care you need, when you need it.  We recommend signing up for the patient portal called "MyChart".  Sign up information is provided on this After Visit Summary.  MyChart is used to connect with patients for Virtual Visits (Telemedicine).  Patients are able to view lab/test results, encounter notes, upcoming appointments, etc.  Non-urgent messages can be sent to your provider as well.   To learn more about what you can do with MyChart, go to [email or web address].    Your next appointment:   6 month(s)  Provider:   You may see Muhammad Arida, MD or one of the following Advanced Practice Providers on your designated Care Team:   Christopher Berge, NP Ryan Dunn, PA-C Cadence Furth, PA-C Sheri Hammock, NP    

## 2022-10-12 NOTE — Progress Notes (Signed)
Cardiology Office Note   Date:  10/12/2022   ID:  Maureen Morales, DOB 08-31-1922, MRN [id number]  PCP:  Sherlene Shams, MD  Cardiologist:   Lorine Bears, MD   Chief Complaint  Patient presents with   Follow-up    6 month f/u c/o edema legs/feet. Meds reviewed verbally with pt.       History of Present Illness: Maureen Morales is a 89 y.o. female who presents for a follow up regarding aortic stenosis and coronary artery disease. She has chronic medical conditions that include hypertension, hyperlipidemia and type 2 diabetes.  She was hospitalized in May of 2018 with non-ST elevation myocardial infarction in the setting of uncontrolled hypertension. Troponin peaked at 5.79. Echocardiogram showed hyperdynamic LV systolic function, mild aortic stenosis with mean gradient of 14 mmHg, mild mitral regurgitation, mild pulmonary hypertension and trivial posterior pericardial effusion. Cardiac catheterization showed mild to moderate nonobstructive coronary artery disease. Worst stenosis was 60% in the mid right coronary artery. Outpatient renal artery duplex showed no evidence of renal artery stenosis. She had right hip fracture in 2018 after a fall and required another surgery after another fall.    She has difficult to control blood pressure with intolerance to antihypertensive medications.  She was hospitalized in April of 2022 with non-ST elevation myocardial infarction in the setting of hypertensive urgency.  Peak high-sensitivity troponin was 1663.  Echocardiogram  showed an ejection fraction of 60 to 65%, moderate left ventricular hypertrophy, grade 1 diastolic dysfunction, mild to moderate mitral regurgitation and mild to moderate aortic stenosis with mean gradient of 16 mmHg and valve area of 1.7 cm.  Medical therapy was recommended with blood pressure control.    She was hospitalized in March with chest pain and shortness of breath and was found to have mildly elevated troponin.   She was treated for atypical pneumonia.  Echocardiogram showed an EF of 60 to 65% with grade 1 diastolic dysfunction, mild pulmonary hypertension, mild to moderate mitral regurgitation and stable moderate aortic stenosis.  She has been doing reasonably well overall with no recurrent chest pain or shortness of breath.  Her blood pressure is on the low side but she denies dizziness.  Past Medical History:  Diagnosis Date   Aortic stenosis    a. 08/2016 Echo: Hyperdynamic LV fxn, mild AS (mean grad ), mild MR, mild PAH; b. 05/2017 Echo: Hyperdynamic LV fxn, mod AS (mean grad , valve area 1.84). PASP .   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 08/2017 Renal U/S: No RAS.   Low back pain with left-sided sciatica 04/29/2018   Myocardial infarction [facility]) 2018   Non-obstructive CAD (coronary artery disease)    a. 08/2016 NSTEMI/Cath: mild to mod nonobs dzs including 60% mRCA stenosis-->Med rx.   NSTEMI (non-ST elevated myocardial infarction) (HCC) 08/30/2016   NSTEMI (non-ST elevated myocardial infarction) (HCC) 08/18/2020   Nummular eczema 01/16/2011   Occurring on chest  and under breast.  Treated by Dr Adolphus Birchwood with fluocinonide 0.05% topical 06/26/12    Peripheral vascular disease (HCC)    Periprosthetic fracture around internal prosthetic hip joint, initial encounter    PONV (postoperative nausea and vomiting)    Positive H. pylori test    Pre-diabetes    Shingles rash 03/04/2020   Top of forehead and left forehead , with irritation  of left sclera as well.  Seeing [facility] today    Suspected COVID-19 virus infection 01/10/2019   Patient reporting malaise and headache for the past 3 days,  Similar to presentation when she was positive for  COVID 19 INFECTION  July 2 .  Previous inection was caused by exposure to COVID positive caregiver.  Repeat scenario:  Same caregiver's mother tested positive on Monday and caregiver saw current patient on Tuesday     Viral gastroenteritis due to [place]-like agent 03/2011    Past Surgical History:  Procedure Laterality Date   ABDOMINAL HYSTERECTOMY     BLADDER SURGERY     CARDIAC CATHETERIZATION     CATARACT EXTRACTION W/PHACO Right 09/29/2020   Procedure: CATARACT EXTRACTION PHACO AND INTRAOCULAR LENS PLACEMENT (IOC) RIGHT VISION BLUE 7.87 00:58.7 ;  Surgeon: Lockie Mola, MD;  Location: [facility];  Service: Ophthalmology;  Laterality: Right;   CATARACT EXTRACTION W/PHACO Left 10/13/2020   Procedure: CATARACT EXTRACTION PHACO AND INTRAOCULAR LENS PLACEMENT (IOC) LEFT VISION BLUE;  Surgeon: Lockie Mola, MD;  Location: [facility];  Service: Ophthalmology;  Laterality: Left;  4.94 1:03.0 7.9%   CHOLECYSTECTOMY     COLONOSCOPY     06/16/1987, 02/20/1995, 05/04/1999, 02/14/2005, 05/17/2007   ESOPHAGOGASTRODUODENOSCOPY     05/11/1987, 06/13/1995, 10/09/1995, 05/04/1999, 01/16/2002   FLEXIBLE SIGMOIDOSCOPY N/A 05/02/2017   Procedure: Wenda Low SIGMOIDOSCOPY;  Surgeon: Scot Jun, MD;  Location: [facility];  Service: Endoscopy;  Laterality: N/A;   HEMORRHOID SURGERY N/A 05/14/2017   Procedure: EXTERNAL THROMBOSED HEMORRHOIDECTOMY;  Surgeon: Earline Mayotte, MD;  Location: [facility] ORS;  Service: General;  Laterality: N/A;   HIP ARTHROPLASTY Right 02/17/2017   Procedure: ARTHROPLASTY BIPOLAR HIP (HEMIARTHROPLASTY);  Surgeon: Danelle Earthly, MD;  Location: [facility] ORS;  Service: Orthopedics;  Laterality: Right;   LEFT HEART CATH AND CORONARY  ANGIOGRAPHY N/A 08/31/2016   Procedure: Left Heart Cath and Coronary Angiography;  Surgeon: Iran Ouch, MD;  Location: [facility] INVASIVE CV LAB;  Service: Cardiovascular;  Laterality: N/A;   ORIF PERIPROSTHETIC FRACTURE Right 05/27/2017   ORIF PERIPROSTHETIC FRACTURE Right 05/27/2017   Procedure: OPEN REDUCTION INTERNAL FIXATION (ORIF) PERIPROSTHETIC FRACTURE;  Surgeon: Kathryne Hitch, MD;  Location: [facility] OR;  Service: Orthopedics;  Laterality: Right;   TOTAL HIP ARTHROPLASTY Right    uterian prolapse       Current Outpatient Medications  Medication Sig Dispense Refill   acetaminophen (TYLENOL) 500 MG tablet Take 1,000 mg by mouth every 6 (six) hours as needed for moderate pain or headache.     albuterol (PROVENTIL) (2.5 MG/3ML) 0.083% nebulizer solution Take 3 mLs (2.5 mg total) by nebulization every 6 (six) hours as needed for wheezing or shortness of breath. 75 mL 12   albuterol (VENTOLIN HFA) 108 (90 Base) MCG/ACT inhaler Inhale 2 puffs into the lungs every 6 (six) hours as needed for wheezing or shortness of breath. 18 g 3   atorvastatin (LIPITOR) 40 MG tablet TAKE 1 TABLET BY MOUTH DAILY 90 tablet 1   carvedilol (COREG) 6.25 MG tablet Take 1 tablet (6.25 mg total) by mouth  2 (two) times daily with a meal. 180 tablet 3   clopidogrel (PLAVIX) 75 MG tablet TAKE ONE TABLET BY MOUTH EVERY DAY 90 tablet 1   CONTOUR NEXT TEST test strip USE AS DIRECTED TWICE A DAY 100 each 5   COVID-19 Home Collection Test KIT Use as needed for signs and symptoms of COVID INFECTION 4 kit 1   CRANBERRY PO Take 2 capsules by mouth 2 (two) times daily.     famotidine (PEPCID) 10 MG tablet Take 10 mg by mouth daily as needed for heartburn or indigestion.     glipiZIDE (GLUCOTROL) 5 MG tablet TAKE ONE TABLET TWICE A DAY BEFORE MEALS 180 tablet 1   hydrALAZINE (APRESOLINE) 25 MG tablet Take 1 tablet (25 mg total) by mouth 3 (three) times daily. 90 tablet 2   Iron, Ferrous Sulfate, 325 (65 Fe) MG TABS Take 325  mg by mouth daily. 90 tablet 0   isosorbide mononitrate (IMDUR) 30 MG 24 hr tablet TAKE ONE TABLET BY MOUTH EVERY MORNING AND TWO TABLETS EVERY EVENING 270 tablet 0   Lactobacillus (ULTIMATE PROBIOTIC FORMULA) CAPS Take 1 capsule by mouth daily with supper.     lansoprazole (PREVACID) 30 MG capsule Take 1 capsule (30 mg total) by mouth 2 (two) times daily before a meal. 180 capsule 0   loratadine (CLARITIN) 10 MG tablet Take 10 mg daily by mouth.      losartan (COZAAR) 100 MG tablet TAKE ONE TABLET BY MOUTH EVERY DAY 90 tablet 1   nitroGLYCERIN (NITROSTAT) 0.4 MG SL tablet Place 1 tablet (0.4 mg total) under the tongue every 5 (five) minutes as needed for chest pain. 10 tablet 2   sertraline (ZOLOFT) 100 MG tablet TAKE 1 TABLET BY MOUTH DAILY. 30 tablet 3   Trospium Chloride 60 MG CP24 TAKE ONE CAPSULE EACH MORNING BEFORE BREAKFAST (Patient taking differently: Take 1 capsule by mouth daily. At lunch) 90 capsule 1   vitamin C (ASCORBIC ACID) 500 MG tablet Take 1 tablet (500 mg total) by mouth 2 (two) times daily. 60 tablet 2   VITAMIN D PO Take 1 capsule by mouth in the morning.     No current facility-administered medications for this visit.    Allergies:   Clindamycin, Oxytetracycline, Phenobarbital, Atropine, Belladonna alkaloids, Codeine, Darvon [propoxyphene], Demerol [meperidine], Methocarbamol, Penicillins, Sulfa antibiotics, and Iron    Social History:  The patient  reports that she has never smoked. She has never used smokeless tobacco. She reports that she does not drink alcohol and does not use drugs.   Family History:  The patient's family history includes Breast cancer (age of onset: 84) in her sister; Breast cancer (age of onset: 91) in her mother; Cancer in her mother; Colon cancer in her brother, brother, and father; Stroke in her sister.    ROS:  Please see the history of present illness.   Otherwise, review of systems are positive for none.   All other systems are reviewed and  negative.    PHYSICAL EXAM: VS:  BP (!) 110/50 (BP Location: Left Arm, Patient Position: Sitting, Cuff Size: Normal)   Pulse 69   Ht 5\' 1"  (1.549 m)   Wt 134 lb 12 oz (61.1 kg)   SpO2 97%   BMI 25.46 kg/m  , BMI Body mass index is 25.46 kg/m. GEN: Well nourished, well developed, in no acute distress  HEENT: normal  Neck: no JVD, carotid bruits, or masses Cardiac: RRR; no rubs, or gallops, mild bilateral leg  edema . 3/6 crescendo decrescendo systolic murmur in the aortic area which is mid peaking Respiratory:  clear to auscultation bilaterally, normal work of breathing GI: soft, nontender, nondistended, + BS MS: no deformity or atrophy  Skin: warm and dry, no rash Neuro:  Strength and sensation are intact Psych: euthymic mood, full affect    EKG:  EKG is ordered today. The ekg ordered today demonstrates normal sinus rhythm, LVH with repolarization abnormalities.   Recent Labs: 01/04/2022: TSH 4.12 06/30/2022: Pro B Natriuretic peptide (BNP) 228.0 07/07/2022: ALT 15; B Natriuretic Peptide 300.1 07/13/2022: BUN 59; Creatinine, Ser 1.35; Hemoglobin 9.8; Platelets 265.0; Potassium 4.2; Sodium 133    Lipid Panel    Component Value Date/Time   CHOL 134 04/19/2021 0951   TRIG 245.0 (H) 04/19/2021 0951   HDL 35.50 (L) 04/19/2021 0951   CHOLHDL 4 04/19/2021 0951   VLDL 49.0 (H) 04/19/2021 0951   LDLCALC 68 12/04/2016 0808   LDLDIRECT 72.0 04/19/2021 0951      Wt Readings from Last 3 Encounters:  10/12/22 134 lb 12 oz (61.1 kg)  09/11/22 136 lb 6.4 oz (61.9 kg)  07/20/22 134 lb (60.8 kg)           No data to display            ASSESSMENT AND PLAN:  1.  Coronary artery disease involving native coronary arteries with stable angina: Recent small non-ST elevation myocardial infarction.  No plans for invasive cardiac evaluation considering her age and comorbidities.  Continue long-term treatment with clopidogrel.  Continue to monitor hemoglobin.  2.  Moderate aortic  stenosis.  Fortunately, this was stable on recent echocardiogram.  Recommend repeat echocardiogram in early 2025   3.  Essential hypertension: Blood pressure is well-controlled on current medications.   4.  Hyperlipidemia: Continue treatment with atorvastatin 40 mg once daily.  Most recent lipid profile showed an LDL of 72  5.  Chronic diastolic heart failure: She appears to be euvolemic without a diuretic.  6.  Right leg pain: Does not seem to be due to peripheral arterial disease and likely related to her previous right hip surgery.  Her dorsalis pedis pulses normal on the right side.    Disposition:   FU with me in 6 months.  Signed,  Lorine Bears, MD  10/12/2022 10:34 AM    [facility]

## 2022-10-13 ENCOUNTER — Ambulatory Visit
Admission: RE | Admit: 2022-10-13 | Discharge: 2022-10-13 | Disposition: A | Discharge status: Home / Self Care | Payer: Medicare Other | Source: Ambulatory Visit | Attending: Pulmonary Disease | Admitting: Pulmonary Disease

## 2022-10-13 ENCOUNTER — Other Ambulatory Visit: Payer: Self-pay | Admitting: Internal Medicine

## 2022-10-13 DIAGNOSIS — R918 Other nonspecific abnormal finding of lung field: Secondary | ICD-10-CM | POA: Diagnosis not present

## 2022-10-13 DIAGNOSIS — J849 Interstitial pulmonary disease, unspecified: Secondary | ICD-10-CM | POA: Diagnosis not present

## 2022-10-18 DIAGNOSIS — Z961 Presence of intraocular lens: Secondary | ICD-10-CM | POA: Diagnosis not present

## 2022-10-25 ENCOUNTER — Other Ambulatory Visit: Payer: Self-pay | Admitting: Internal Medicine

## 2022-10-31 ENCOUNTER — Encounter: Payer: Self-pay | Admitting: Pulmonary Disease

## 2022-10-31 ENCOUNTER — Ambulatory Visit (INDEPENDENT_AMBULATORY_CARE_PROVIDER_SITE_OTHER): Payer: Medicare Other | Admitting: Pulmonary Disease

## 2022-10-31 VITALS — BP 118/80 | HR 68 | Temp 97.9°F | Ht 61.0 in | Wt 134.0 lb

## 2022-10-31 DIAGNOSIS — J849 Interstitial pulmonary disease, unspecified: Secondary | ICD-10-CM | POA: Diagnosis not present

## 2022-10-31 DIAGNOSIS — I080 Rheumatic disorders of both mitral and aortic valves: Secondary | ICD-10-CM | POA: Diagnosis not present

## 2022-10-31 DIAGNOSIS — R918 Other nonspecific abnormal finding of lung field: Secondary | ICD-10-CM

## 2022-10-31 NOTE — Patient Instructions (Addendum)
Your scan of the chest looked good.  Your chest sounded good.  We will see you in follow-up in 6 months time or sooner if you need anything prior to that.

## 2022-10-31 NOTE — Progress Notes (Signed)
Subjective:    Patient ID: Maureen Morales, female    DOB: 02-07-1923, 89 y.o.   MRN: [id number]  Patient Care Team: Sherlene Shams, MD as PCP - General (Internal Medicine) Iran Ouch, MD as PCP - Cardiology (Cardiology) Salena Saner, MD as Consulting Physician (Pulmonary Disease)  Chief Complaint  Patient presents with   Follow-up    No SOB or cough. A little wheezing at night.   HPI Maureen Morales is a delightful 89 year old lifelong never smoker who presents for follow-up on her visit of 11 Sep 2022.  Call she had a hospitalization at [facility] between 07 July 2022 through 10 July 2022.  During that admission in March the patient presented with chest pain and associated shortness of breath.  Patient was noted to have hazy bilateral airspace opacities elevated troponin and modestly elevated BNP.  The patient was treated for suspected acute on chronic diastolic congestive heart failure and responded well to diuretics.  Chest CT performed at that time was consistent with pulmonary edema versus interstitial disease/atypical infection.  She did get a short course of empiric steroids and azithromycin as the findings could also be an inflammatory change.  However the most dramatic response in the patient's symptomatology was from diuretics.  She also was noted to be quite hypertensive during that admission and her antihypertensives were optimized.  She was also noted to be anemic and received Venofer.  He has a known iron deficiency anemia.   When I saw her on 13 May she had no respiratory complaints.  A follow-up CT chest was ordered to reassess the changes noted during her admission. She had follow-up CT chest on on 13 October 2022 and this showed resolution of the previously noted groundglass attenuation and potential interstitial changes.  Most likely this was related to edema/atypical infection.   Today she presents doing well. She has not had any fevers, chills or sweats since last we met, no  chest pain, no lower extremity edema.  No cough or sputum production.  We reviewed the findings of her CT with her and her daughter who accompanies her.   Overall she feels well and looks well.     Review of Systems A 10 point review of systems was performed and it is as noted above otherwise negative.   Patient Active Problem List   Diagnosis Date Noted   Metabolic acidosis 07/13/2022   History of non-ST elevation myocardial infarction (NSTEMI) 07/13/2022   Acute decompensated heart failure (HCC) 07/08/2022   NSTEMI (non-ST elevated myocardial infarction) (HCC) 07/07/2022   Demand ischemia 07/07/2022   Iron deficiency 07/02/2022   Bronchitis 11/24/2021   Urinary incontinence, nocturnal enuresis 01/17/2020   Chest pain 07/17/2019   Elevated ALT measurement 07/17/2019   Moderate tricuspid valve regurgitation 09/03/2017   Diastolic dysfunction without heart failure 09/03/2017   Labile blood pressure    CKD stage G3b/A1, GFR 30-44 and albumin creatinine ratio <30 mg/g (HCC)    Chronic diastolic congestive heart failure (HCC)    History of right hip hemiarthroplasty    Diabetes mellitus type 2 in nonobese (HCC)    AKI (acute kidney injury) (HCC)    Recurrent falls 05/26/2017   Normocytic anemia 05/26/2017   CAD (coronary artery disease) 05/26/2017   Generalized anxiety disorder 09/07/2016   Unsteady gait 09/07/2016   Hospital discharge follow-up 09/07/2016   S/P laparoscopic cholecystectomy 12/15/2013   Encounter for preventive health examination 11/13/2013   Spondylosis of cervical spine 01/27/2013   Anemia,  iron deficiency 01/21/2013   Rheumatic aortic stenosis 05/06/2012   Chronic interstitial lung disease (HCC) 07/04/2011   Hemorrhoids, internal 04/05/2011   Essential hypertension    Type II diabetes mellitus with nephropathy (HCC)    Hyperlipidemia     Social History   Tobacco Use   Smoking status: Never   Smokeless tobacco: Never  Substance Use Topics    Alcohol use: No    Allergies  Allergen Reactions   Clindamycin     dizziness   Oxytetracycline Hives   Phenobarbital Hives   Atropine Other (See Comments)    Reaction: unknown   Belladonna Alkaloids Other (See Comments)    Reaction: unknown   Codeine Nausea And Vomiting   Darvon [Propoxyphene] Nausea And Vomiting   Demerol [Meperidine] Nausea And Vomiting   Methocarbamol Other (See Comments)    Reaction: unknown   Penicillins Other (See Comments)    Pt and family unsure of details.  Can tolerate cephalosporins   Sulfa Antibiotics Other (See Comments)    Reaction: unknown   Iron Other (See Comments)    High dose prescription gives her diarrhea    Current Meds  Medication Sig   acetaminophen (TYLENOL) 500 MG tablet Take 1,000 mg by mouth every 6 (six) hours as needed for moderate pain or headache.   albuterol (PROVENTIL) (2.5 MG/3ML) 0.083% nebulizer solution Take 3 mLs (2.5 mg total) by nebulization every 6 (six) hours as needed for wheezing or shortness of breath.   albuterol (VENTOLIN HFA) 108 (90 Base) MCG/ACT inhaler Inhale 2 puffs into the lungs every 6 (six) hours as needed for wheezing or shortness of breath.   atorvastatin (LIPITOR) 40 MG tablet TAKE 1 TABLET BY MOUTH DAILY   carvedilol (COREG) 6.25 MG tablet Take 1 tablet (6.25 mg total) by mouth 2 (two) times daily with a meal.   clopidogrel (PLAVIX) 75 MG tablet TAKE ONE TABLET BY MOUTH EVERY DAY   CONTOUR NEXT TEST test strip USE AS DIRECTED TWICE A DAY   COVID-19 Home Collection Test KIT Use as needed for signs and symptoms of COVID INFECTION   CRANBERRY PO Take 2 capsules by mouth 2 (two) times daily.   famotidine (PEPCID) 10 MG tablet Take 10 mg by mouth daily as needed for heartburn or indigestion.   glipiZIDE (GLUCOTROL) 5 MG tablet TAKE ONE TABLET TWICE A DAY BEFORE MEALS   hydrALAZINE (APRESOLINE) 25 MG tablet Take 1 tablet (25 mg total) by mouth 3 (three) times daily.   Iron, Ferrous Sulfate, 325 (65 Fe) MG  TABS Take 325 mg by mouth daily.   isosorbide mononitrate (IMDUR) 30 MG 24 hr tablet TAKE ONE TABLET BY MOUTH EVERY MORNING AND TWO TABLETS EVERY EVENING   Lactobacillus (ULTIMATE PROBIOTIC FORMULA) CAPS Take 1 capsule by mouth daily with supper.   lansoprazole (PREVACID) 30 MG capsule Take 1 capsule (30 mg total) by mouth 2 (two) times daily before a meal.   loratadine (CLARITIN) 10 MG tablet Take 10 mg daily by mouth.    losartan (COZAAR) 100 MG tablet TAKE ONE TABLET BY MOUTH EVERY DAY   nitroGLYCERIN (NITROSTAT) 0.4 MG SL tablet Place 1 tablet (0.4 mg total) under the tongue every 5 (five) minutes as needed for chest pain.   sertraline (ZOLOFT) 100 MG tablet TAKE 1 TABLET BY MOUTH DAILY.   Trospium Chloride 60 MG CP24 TAKE ONE CAPSULE EACH MORNING BEFORE BREAKFAST   vitamin C (ASCORBIC ACID) 500 MG tablet Take 1 tablet (500 mg total) by mouth  2 (two) times daily.   VITAMIN D PO Take 1 capsule by mouth in the morning.    Immunization History  Administered Date(s) Administered   Influenza Split 02/01/2011   Influenza, High Dose Seasonal PF 02/27/2018, 02/18/2019, 02/21/2021   Influenza, Seasonal, Injecte, Preservative Fre 02/14/2017   Influenza,inj,Quad PF,6+ Mos 01/08/2013, 12/28/2014   Influenza-Unspecified 01/30/2012, 01/29/2014, 01/25/2016   PFIZER(Purple Top)SARS-COV-2 Vaccination 06/16/2019, 07/15/2019, 02/23/2020   Pneumococcal Conjugate-13 07/31/2013, 09/07/2014   Pneumococcal Polysaccharide-23 08/01/2006, 02/18/2017   Tdap 05/17/2008, 08/20/2019   Zoster, Live 11/14/2010        Objective:     BP 118/80 (BP Location: Left Arm, Cuff Size: Normal)   Pulse 68   Temp 97.9 F (36.6 C)   Ht 5\' 1"  (1.549 m)   Wt 134 lb (60.8 kg) Comment: per patient. in a wheelchair today  SpO2 94%   BMI 25.32 kg/m   SpO2: 94 % O2 Device: None (Room air)  GENERAL: Awake and alert, interactive.  She presents in a transport chair.  No conversational dyspnea.  No distress.  Hard of  hearing. HEAD: Normocephalic, atraumatic.  EYES: Pupils equal, round, reactive to light.  No scleral icterus.  MOUTH: Dentition for the most part intact, few chipped teeth. NECK: Supple. No thyromegaly. Trachea midline. No JVD.  No adenopathy. PULMONARY: Lungs clear to auscultation bilaterally.  Moving air well. CARDIOVASCULAR: S1 and S2. Regular rate and rhythm.  Mitral regurgitation murmur 2/6, aortic stenosis murmur 2-3/6. GASTROINTESTINAL: Benign. MUSCULOSKELETAL: No joint deformity, no clubbing, no edema.  NEUROLOGIC: No focal deficits noted.  Fluent speech.  No vocal hoarseness noted. SKIN: Intact,warm,dry. PSYCH: Mood and behavior appropriate.  I reviewed her recent CT chest of 13 October 2022, independently showed the patient and her daughter the films as well.  The previously noted heterogeneous groundglass attenuation and interstitial thickening has been greatly improved compatible with resolving inflammation or atypical infection//edema.    Assessment & Plan:     ICD-10-CM   1. ILD (interstitial lung disease) (HCC) vs edema  J84.9    RESOLVED Findings more compatible with edema/inflammation Resolved on most recent CT scan    2. Multiple lung nodules on CT  R91.8    Stable No further imaging necessary    3. Mitral regurgitation and aortic stenosis  I08.0    This issue adds complexity to her management She follows with cardiology     We will see the patient in follow-up in 6 months time she is to contact us prior to that time should any new difficulties arise.   Maureen Shelter, MD Advanced Bronchoscopy PCCM [facility]    *This note was dictated using voice recognition software/Dragon.  Despite best efforts to proofread, errors can occur which can change the meaning. Any transcriptional errors that result from this process are unintentional and may not be fully corrected at the time of dictation.

## 2022-11-08 ENCOUNTER — Other Ambulatory Visit: Payer: Self-pay | Admitting: Internal Medicine

## 2022-12-12 ENCOUNTER — Other Ambulatory Visit: Payer: Self-pay | Admitting: Internal Medicine

## 2022-12-12 ENCOUNTER — Other Ambulatory Visit: Payer: Self-pay | Admitting: Cardiovascular Disease

## 2022-12-28 ENCOUNTER — Other Ambulatory Visit: Payer: Self-pay | Admitting: Internal Medicine

## 2023-01-11 ENCOUNTER — Other Ambulatory Visit: Payer: Self-pay | Admitting: Internal Medicine

## 2023-01-11 DIAGNOSIS — Z96641 Presence of right artificial hip joint: Secondary | ICD-10-CM

## 2023-01-11 DIAGNOSIS — G8929 Other chronic pain: Secondary | ICD-10-CM

## 2023-01-13 ENCOUNTER — Ambulatory Visit
Admission: RE | Admit: 2023-01-13 | Discharge: 2023-01-13 | Disposition: A | Discharge status: Home / Self Care | Payer: Medicare Other | Source: Ambulatory Visit | Attending: Internal Medicine | Admitting: Internal Medicine

## 2023-01-13 DIAGNOSIS — M25551 Pain in right hip: Secondary | ICD-10-CM | POA: Diagnosis not present

## 2023-01-13 DIAGNOSIS — G8929 Other chronic pain: Secondary | ICD-10-CM | POA: Diagnosis not present

## 2023-01-13 DIAGNOSIS — M79604 Pain in right leg: Secondary | ICD-10-CM | POA: Diagnosis not present

## 2023-01-13 DIAGNOSIS — Z96641 Presence of right artificial hip joint: Secondary | ICD-10-CM | POA: Diagnosis not present

## 2023-01-13 DIAGNOSIS — R600 Localized edema: Secondary | ICD-10-CM | POA: Diagnosis not present

## 2023-01-19 ENCOUNTER — Other Ambulatory Visit: Payer: Self-pay | Admitting: Internal Medicine

## 2023-01-19 DIAGNOSIS — G8929 Other chronic pain: Secondary | ICD-10-CM | POA: Insufficient documentation

## 2023-01-19 DIAGNOSIS — R0989 Other specified symptoms and signs involving the circulatory and respiratory systems: Secondary | ICD-10-CM

## 2023-01-19 NOTE — Assessment & Plan Note (Signed)
She has had severe pain  for the last several weeks which is aggravated by movement.  She has been nonambulatory by choice for the last 3 years.  Due to recurrent falls and has developed atrophy of the glut muscles.  MRI was done Sept 14 to inpsect hardware and rule out occult fracture.  She  has CKD stage 3b , is intolerant of analgesics and refuses to take therapeutic does of tylenol due to sedating effects.   Referring to dr Magnus Ivan to consider options for pain management eg  intraarticular steroid injection

## 2023-01-22 ENCOUNTER — Ambulatory Visit (INDEPENDENT_AMBULATORY_CARE_PROVIDER_SITE_OTHER): Payer: Medicare Other | Admitting: Orthopaedic Surgery

## 2023-01-22 ENCOUNTER — Encounter: Payer: Self-pay | Admitting: Orthopaedic Surgery

## 2023-01-22 DIAGNOSIS — G8929 Other chronic pain: Secondary | ICD-10-CM | POA: Diagnosis not present

## 2023-01-22 DIAGNOSIS — M5441 Lumbago with sciatica, right side: Secondary | ICD-10-CM

## 2023-01-22 DIAGNOSIS — M25551 Pain in right hip: Secondary | ICD-10-CM

## 2023-01-22 NOTE — Progress Notes (Signed)
The patient is a 89 year old female with an unfortunate situation in terms of the previous hip injury.  Back in 2019 she had a mechanical fall sustaining a right hip femoral neck fracture.  She was treated in [place] with a right hip hemiarthroplasty.  She then fell directly after that and sustained a periprosthetic fracture of the right proximal femur and underwent open reduction/internal fixation with plates and screws and cables.  She has had a hard time mobilizing since then.  She continues to deal with chronic pain with her right hip and thigh but also it does radiate into her foot.  She has multiple comorbidities and really cannot tolerate any type of surgery.  She is on Plavix as well.  She does mainly get around just a wheelchair but he gets an occasional sharp and grabbing pain.  Someone actually recently sent her for MRI of her right hip which did show a lot of artifact from her previous hardware in the right hip and those films are not as useful as the plain films which really show what is going on with her right hip and pelvis and I was able to review these with her and her family today.  On exam I can put her right hip through internal and external rotation but is painful to do so.  She does have a radicular component of her pain as well with a positive straight leg raise on the right side.  She has chronic swelling in the right lower extremity as well.  I was able to review all of her notes within epic as well as her past medical history and medical issues and they are complex.  The x-rays of her pelvis and right hip show the hemiarthroplasty is in place as well as a plate and screws and everything is healed but obviously this is thrown off her mechanics quite significantly and likely the hemiarthroplasty is chronically loose.  From my perspective the only operation that I could recommend would be removing the hardware and placing a total hip arthroplasty which I do not know if she could  really truly tolerate that type of surgery.  It is worth at least obtaining a MRI of her lumbar spine given her right lower extremity radicular symptoms to determine whether or not she would benefit from any type of intervention in her back that can help her right lower extremity.  They agree with this treatment plan.  We can see her back once we have that MRI.

## 2023-01-22 NOTE — Addendum Note (Signed)
Addended by: Mardene Celeste B on: 01/22/2023 03:57 PM   Modules accepted: Orders

## 2023-01-30 ENCOUNTER — Ambulatory Visit
Admission: RE | Admit: 2023-01-30 | Discharge: 2023-01-30 | Disposition: A | Discharge status: Home / Self Care | Payer: Medicare Other | Source: Ambulatory Visit | Attending: Orthopaedic Surgery | Admitting: Orthopaedic Surgery

## 2023-01-30 DIAGNOSIS — M5441 Lumbago with sciatica, right side: Secondary | ICD-10-CM | POA: Insufficient documentation

## 2023-01-30 DIAGNOSIS — M48061 Spinal stenosis, lumbar region without neurogenic claudication: Secondary | ICD-10-CM | POA: Diagnosis not present

## 2023-01-30 DIAGNOSIS — G8929 Other chronic pain: Secondary | ICD-10-CM | POA: Insufficient documentation

## 2023-01-30 DIAGNOSIS — M5126 Other intervertebral disc displacement, lumbar region: Secondary | ICD-10-CM | POA: Diagnosis not present

## 2023-01-30 DIAGNOSIS — M4187 Other forms of scoliosis, lumbosacral region: Secondary | ICD-10-CM | POA: Diagnosis not present

## 2023-01-30 DIAGNOSIS — M4807 Spinal stenosis, lumbosacral region: Secondary | ICD-10-CM | POA: Diagnosis not present

## 2023-01-31 ENCOUNTER — Other Ambulatory Visit: Payer: Self-pay | Admitting: Internal Medicine

## 2023-02-05 ENCOUNTER — Telehealth: Payer: Self-pay

## 2023-02-05 ENCOUNTER — Other Ambulatory Visit (INDEPENDENT_AMBULATORY_CARE_PROVIDER_SITE_OTHER): Payer: Medicare Other

## 2023-02-05 DIAGNOSIS — R300 Dysuria: Secondary | ICD-10-CM | POA: Diagnosis not present

## 2023-02-05 LAB — URINALYSIS, ROUTINE W REFLEX MICROSCOPIC
Bilirubin Urine: NEGATIVE
Ketones, ur: NEGATIVE
Nitrite: POSITIVE — AB
Specific Gravity, Urine: 1.01 (ref 1.000–1.030)
Urine Glucose: NEGATIVE
Urobilinogen, UA: 0.2 (ref 0.0–1.0)
pH: 6 (ref 5.0–8.0)

## 2023-02-05 NOTE — Telephone Encounter (Signed)
Orders placed for lab appt today.

## 2023-02-06 MED ORDER — CIPROFLOXACIN HCL 250 MG PO TABS: 250.0000 mg | ORAL_TABLET | Freq: Two times a day (BID) | ORAL | 0 refills | Status: AC

## 2023-02-06 NOTE — Addendum Note (Signed)
Addended by: Sherlene Shams on: 02/06/2023 08:18 AM   Modules accepted: Orders

## 2023-02-07 LAB — URINE CULTURE
MICRO NUMBER:: 15560811
SPECIMEN QUALITY:: ADEQUATE

## 2023-02-20 ENCOUNTER — Other Ambulatory Visit: Payer: Self-pay | Admitting: Cardiovascular Disease

## 2023-02-21 ENCOUNTER — Other Ambulatory Visit: Payer: Self-pay | Admitting: Internal Medicine

## 2023-02-28 ENCOUNTER — Other Ambulatory Visit: Payer: Self-pay | Admitting: Internal Medicine

## 2023-02-28 ENCOUNTER — Other Ambulatory Visit: Payer: Self-pay | Admitting: Cardiovascular Disease

## 2023-03-03 DIAGNOSIS — M25522 Pain in left elbow: Secondary | ICD-10-CM | POA: Diagnosis not present

## 2023-03-16 DIAGNOSIS — M25522 Pain in left elbow: Secondary | ICD-10-CM | POA: Diagnosis not present

## 2023-03-17 ENCOUNTER — Other Ambulatory Visit: Payer: Self-pay | Admitting: Internal Medicine

## 2023-03-18 ENCOUNTER — Other Ambulatory Visit: Payer: Self-pay | Admitting: Internal Medicine

## 2023-03-18 DIAGNOSIS — M5416 Radiculopathy, lumbar region: Secondary | ICD-10-CM

## 2023-03-23 ENCOUNTER — Other Ambulatory Visit: Payer: Self-pay | Admitting: Internal Medicine

## 2023-03-26 DIAGNOSIS — M25522 Pain in left elbow: Secondary | ICD-10-CM | POA: Diagnosis not present

## 2023-04-03 ENCOUNTER — Other Ambulatory Visit: Payer: Self-pay | Admitting: Internal Medicine

## 2023-04-11 DIAGNOSIS — H903 Sensorineural hearing loss, bilateral: Secondary | ICD-10-CM | POA: Diagnosis not present

## 2023-04-11 DIAGNOSIS — H6123 Impacted cerumen, bilateral: Secondary | ICD-10-CM | POA: Diagnosis not present

## 2023-04-13 ENCOUNTER — Other Ambulatory Visit: Payer: Self-pay | Admitting: Internal Medicine

## 2023-04-13 ENCOUNTER — Other Ambulatory Visit (INDEPENDENT_AMBULATORY_CARE_PROVIDER_SITE_OTHER): Payer: Medicare Other

## 2023-04-13 DIAGNOSIS — N390 Urinary tract infection, site not specified: Secondary | ICD-10-CM

## 2023-04-13 LAB — URINALYSIS, ROUTINE W REFLEX MICROSCOPIC
Bilirubin Urine: NEGATIVE
Hgb urine dipstick: NEGATIVE
Ketones, ur: NEGATIVE
Nitrite: POSITIVE — AB
RBC / HPF: NONE SEEN (ref 0–?)
Specific Gravity, Urine: 1.015 (ref 1.000–1.030)
Total Protein, Urine: NEGATIVE
Urine Glucose: NEGATIVE
Urobilinogen, UA: 0.2 (ref 0.0–1.0)
pH: 5.5 (ref 5.0–8.0)

## 2023-04-13 NOTE — Assessment & Plan Note (Signed)
Family requesting UTI rule out for cloudy urine.  Patient is nonambulatory and has urge incontinence and recurrent UTIs

## 2023-04-15 ENCOUNTER — Other Ambulatory Visit: Payer: Self-pay | Admitting: Internal Medicine

## 2023-04-15 LAB — URINE CULTURE
MICRO NUMBER:: 15850082
SPECIMEN QUALITY:: ADEQUATE

## 2023-04-15 MED ORDER — CIPROFLOXACIN HCL 250 MG PO TABS: 250.0000 mg | ORAL_TABLET | Freq: Two times a day (BID) | ORAL | 0 refills | Status: DC

## 2023-04-15 MED ORDER — CIPROFLOXACIN HCL 250 MG PO TABS: 250.0000 mg | ORAL_TABLET | Freq: Two times a day (BID) | ORAL | 0 refills | Status: AC

## 2023-04-15 NOTE — Addendum Note (Signed)
Addended by: Sherlene Shams on: 04/15/2023 04:25 PM   Modules accepted: Orders

## 2023-04-19 ENCOUNTER — Ambulatory Visit: Payer: Medicare Other | Attending: Cardiovascular Disease | Admitting: Cardiovascular Disease

## 2023-04-19 ENCOUNTER — Encounter: Payer: Self-pay | Admitting: Cardiovascular Disease

## 2023-04-19 VITALS — BP 130/70 | HR 72 | Ht 61.0 in | Wt 132.5 lb

## 2023-04-19 DIAGNOSIS — I350 Nonrheumatic aortic (valve) stenosis: Secondary | ICD-10-CM | POA: Insufficient documentation

## 2023-04-19 DIAGNOSIS — I10 Essential (primary) hypertension: Secondary | ICD-10-CM | POA: Insufficient documentation

## 2023-04-19 DIAGNOSIS — E785 Hyperlipidemia, unspecified: Secondary | ICD-10-CM | POA: Diagnosis not present

## 2023-04-19 DIAGNOSIS — I25118 Atherosclerotic heart disease of native coronary artery with other forms of angina pectoris: Secondary | ICD-10-CM | POA: Diagnosis not present

## 2023-04-19 DIAGNOSIS — I5032 Chronic diastolic (congestive) heart failure: Secondary | ICD-10-CM | POA: Diagnosis not present

## 2023-04-19 NOTE — Progress Notes (Signed)
Cardiology Office Note   Date:  04/19/2023   ID:  Maureen Morales, DOB 01-10-1923, MRN [id number]  PCP:  Sherlene Shams, MD  Cardiologist:   Lorine Bears, MD   Chief Complaint  Patient presents with   6 month follow up     Patient c/o chest indigestion.        History of Present Illness: Maureen Morales is a 89 y.o. female who presents for a follow up regarding aortic stenosis and coronary artery disease. She has chronic medical conditions that include hypertension, hyperlipidemia and type 2 diabetes.  She was hospitalized in May of 2018 with non-ST elevation myocardial infarction in the setting of uncontrolled hypertension. Troponin peaked at 5.79. Echocardiogram showed hyperdynamic LV systolic function, mild aortic stenosis with mean gradient of 14 mmHg, mild mitral regurgitation, mild pulmonary hypertension and trivial posterior pericardial effusion. Cardiac catheterization showed mild to moderate nonobstructive coronary artery disease. Worst stenosis was 60% in the mid right coronary artery. Outpatient renal artery duplex showed no evidence of renal artery stenosis. She had right hip fracture in 2018 after a fall and required another surgery after another fall.    She has difficult to control blood pressure with intolerance to antihypertensive medications.  She was hospitalized in April of 2022 with non-ST elevation myocardial infarction in the setting of hypertensive urgency.  Peak high-sensitivity troponin was 1663.  Echocardiogram  showed an ejection fraction of 60 to 65%, moderate left ventricular hypertrophy, grade 1 diastolic dysfunction, mild to moderate mitral regurgitation and mild to moderate aortic stenosis with mean gradient of 16 mmHg and valve area of 1.7 cm.  Medical therapy was recommended with blood pressure control.    She was hospitalized in March of 2024 with chest pain and shortness of breath and was found to have mildly elevated troponin.  She was treated  for atypical pneumonia.  Echocardiogram showed an EF of 60 to 65% with grade 1 diastolic dysfunction, mild pulmonary hypertension, mild to moderate mitral regurgitation and stable moderate aortic stenosis.  She has been doing extremely well with no chest pain, shortness of breath or palpitations.  Her blood pressure has been controlled at home with minimal lower extremity edema.  She walks with a walker.  Past Medical History:  Diagnosis Date   Aortic stenosis    a. 08/2016 Echo: Hyperdynamic LV fxn, mild AS (mean grad ), mild MR, mild PAH; b. 05/2017 Echo: Hyperdynamic LV fxn, mod AS (mean grad , valve area 1.84). PASP .   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 08/2017 Renal U/S: No RAS.   Low back pain with left-sided sciatica 04/29/2018   Myocardial infarction [facility]) 2018   Non-obstructive CAD (coronary artery disease)    a. 08/2016 NSTEMI/Cath: mild to mod nonobs dzs including 60% mRCA stenosis-->Med rx.   NSTEMI (non-ST elevated myocardial infarction) (HCC) 08/30/2016   NSTEMI (non-ST elevated myocardial infarction) (HCC) 08/18/2020   Nummular eczema  01/16/2011   Occurring on chest and under breast.  Treated by Dr Adolphus Birchwood with fluocinonide 0.05% topical 06/26/12    Peripheral vascular disease (HCC)    Periprosthetic fracture around internal prosthetic hip joint, initial encounter    PONV (postoperative nausea and vomiting)    Positive H. pylori test    Pre-diabetes    Shingles rash 03/04/2020   Top of forehead and left  forehead , with irritation of left sclera as well.  Seeing [facility] today    Suspected COVID-19 virus infection 01/10/2019   Patient reporting malaise and headache for the past 3 days,  Similar to presentation when she was positive for  COVID 19 INFECTION  July 2 .  Previous inection was caused by exposure to COVID positive caregiver.  Repeat scenario:  Same caregiver's mother tested positive on Monday and caregiver saw current patient on Tuesday     Viral gastroenteritis due to [place]-like agent 03/2011    Past Surgical History:  Procedure Laterality Date   ABDOMINAL HYSTERECTOMY     BLADDER SURGERY     CARDIAC CATHETERIZATION     CATARACT EXTRACTION W/PHACO Right 09/29/2020   Procedure: CATARACT EXTRACTION PHACO AND INTRAOCULAR LENS PLACEMENT (IOC) RIGHT VISION BLUE 7.87 00:58.7 ;  Surgeon: Lockie Mola, MD;  Location: [facility];  Service: Ophthalmology;  Laterality: Right;   CATARACT EXTRACTION W/PHACO Left 10/13/2020   Procedure: CATARACT EXTRACTION PHACO AND INTRAOCULAR LENS PLACEMENT (IOC) LEFT VISION BLUE;  Surgeon: Lockie Mola, MD;  Location: [facility];  Service: Ophthalmology;  Laterality: Left;  4.94 1:03.0 7.9%   CHOLECYSTECTOMY     COLONOSCOPY     06/16/1987, 02/20/1995, 05/04/1999, 02/14/2005, 05/17/2007   ESOPHAGOGASTRODUODENOSCOPY     05/11/1987, 06/13/1995, 10/09/1995, 05/04/1999, 01/16/2002   FLEXIBLE SIGMOIDOSCOPY N/A 05/02/2017   Procedure: Wenda Low SIGMOIDOSCOPY;  Surgeon: Scot Jun, MD;  Location: [facility];  Service: Endoscopy;  Laterality: N/A;   HEMORRHOID SURGERY N/A 05/14/2017   Procedure: EXTERNAL THROMBOSED HEMORRHOIDECTOMY;  Surgeon: Earline Mayotte, MD;  Location: [facility] ORS;  Service: General;  Laterality: N/A;   HIP ARTHROPLASTY Right 02/17/2017   Procedure: ARTHROPLASTY BIPOLAR HIP (HEMIARTHROPLASTY);  Surgeon: Danelle Earthly, MD;  Location: [facility] ORS;  Service: Orthopedics;  Laterality: Right;   LEFT HEART  CATH AND CORONARY ANGIOGRAPHY N/A 08/31/2016   Procedure: Left Heart Cath and Coronary Angiography;  Surgeon: Iran Ouch, MD;  Location: [facility] INVASIVE CV LAB;  Service: Cardiovascular;  Laterality: N/A;   ORIF PERIPROSTHETIC FRACTURE Right 05/27/2017   ORIF PERIPROSTHETIC FRACTURE Right 05/27/2017   Procedure: OPEN REDUCTION INTERNAL FIXATION (ORIF) PERIPROSTHETIC FRACTURE;  Surgeon: Kathryne Hitch, MD;  Location: [facility] OR;  Service: Orthopedics;  Laterality: Right;   TOTAL HIP ARTHROPLASTY Right    uterian prolapse       Current Outpatient Medications  Medication Sig Dispense Refill   acetaminophen (TYLENOL) 500 MG tablet Take 1,000 mg by mouth every 6 (six) hours as needed for moderate pain or headache.     albuterol (PROVENTIL) (2.5 MG/3ML) 0.083% nebulizer solution Take 3 mLs (2.5 mg total) by nebulization every 6 (six) hours as needed for wheezing or shortness of breath. 75 mL 12   albuterol (VENTOLIN HFA) 108 (90 Base) MCG/ACT inhaler Inhale 2 puffs into the lungs every 6 (six) hours as needed for wheezing or shortness of breath. 18 g 3   atorvastatin (LIPITOR) 40 MG tablet TAKE 1 TABLET BY MOUTH DAILY 90 tablet 1   carvedilol (COREG) 6.25 MG tablet Take 1  tablet (6.25 mg total) by mouth 2 (two) times daily with a meal. 180 tablet 3   clopidogrel (PLAVIX) 75 MG tablet TAKE ONE TABLET BY MOUTH EVERY DAY 90 tablet 1   CONTOUR NEXT TEST test strip USE AS DIRECTED TWICE A DAY 100 each 5   COVID-19 Home Collection Test KIT Use as needed for signs and symptoms of COVID INFECTION 4 kit 1   CRANBERRY PO Take 2 capsules by mouth 2 (two) times daily.     famotidine (PEPCID) 10 MG tablet Take 10 mg by mouth daily as needed for heartburn or indigestion.     FEROSUL 325 (65 Fe) MG tablet TAKE 1 TABLET BY MOUTH DAILY 90 tablet 0   glipiZIDE (GLUCOTROL) 5 MG tablet TAKE ONE TABLET TWICE A DAY BEFORE MEALS 180 tablet 1   hydrALAZINE (APRESOLINE) 25 MG tablet TAKE 1 TABLET BY MOUTH 3 TIMES  DAILY. 90 tablet 2   isosorbide mononitrate (IMDUR) 30 MG 24 hr tablet TAKE ONE TABLET BY MOUTH EVERY MORNING AND TWO TABLETS EVERY EVENING 270 tablet 0   Lactobacillus (ULTIMATE PROBIOTIC FORMULA) CAPS Take 1 capsule by mouth daily with supper.     lansoprazole (PREVACID) 30 MG capsule Take 1 capsule (30 mg total) by mouth 2 (two) times daily before a meal. 180 capsule 0   loratadine (CLARITIN) 10 MG tablet Take 10 mg daily by mouth.      losartan (COZAAR) 100 MG tablet TAKE ONE TABLET BY MOUTH EVERY DAY 90 tablet 1   nitroGLYCERIN (NITROSTAT) 0.4 MG SL tablet Place 1 tablet (0.4 mg total) under the tongue every 5 (five) minutes as needed for chest pain. 10 tablet 2   sertraline (ZOLOFT) 100 MG tablet TAKE 1 TABLET BY MOUTH DAILY. 30 tablet 3   Trospium Chloride 60 MG CP24 TAKE ONE CAPSULE EACH MORNING BEFORE BREAKFAST 90 capsule 1   vitamin C (ASCORBIC ACID) 500 MG tablet Take 1 tablet (500 mg total) by mouth 2 (two) times daily. 60 tablet 2   VITAMIN D PO Take 1 capsule by mouth in the morning.     No current facility-administered medications for this visit.    Allergies:   Clindamycin, Oxytetracycline, Phenobarbital, Atropine, Belladonna alkaloids, Codeine, Darvon [propoxyphene], Demerol [meperidine], Methocarbamol, Penicillins, Sulfa antibiotics, and Iron    Social History:  The patient  reports that she has never smoked. She has never used smokeless tobacco. She reports that she does not drink alcohol and does not use drugs.   Family History:  The patient's family history includes Breast cancer (age of onset: 63) in her sister; Breast cancer (age of onset: 36) in her mother; Cancer in her mother; Colon cancer in her brother, brother, and father; Stroke in her sister.    ROS:  Please see the history of present illness.   Otherwise, review of systems are positive for none.   All other systems are reviewed and negative.    PHYSICAL EXAM: VS:  BP 130/70 (BP Location: Left Arm, Patient  Position: Sitting, Cuff Size: Normal)   Pulse 72   Ht 5\' 1"  (1.549 m)   Wt 132 lb 8 oz (60.1 kg)   SpO2 96%   BMI 25.04 kg/m  , BMI Body mass index is 25.04 kg/m. GEN: Well nourished, well developed, in no acute distress  HEENT: normal  Neck: no JVD, carotid bruits, or masses Cardiac: RRR; no rubs, or gallops, mild bilateral leg edema . 3/6 crescendo decrescendo systolic murmur in the aortic area which  is mid peaking Respiratory:  clear to auscultation bilaterally, normal work of breathing GI: soft, nontender, nondistended, + BS MS: no deformity or atrophy  Skin: warm and dry, no rash Neuro:  Strength and sensation are intact Psych: euthymic mood, full affect    EKG:  EKG is ordered today. The ekg ordered today demonstrates : Normal sinus rhythm Left ventricular hypertrophy with repolarization abnormality ( R in aVL ) Cannot rule out Inferior infarct , age undetermined    Recent Labs: 06/30/2022: Pro B Natriuretic peptide (BNP) 228.0 07/07/2022: ALT 15; B Natriuretic Peptide 300.1 07/13/2022: BUN 59; Creatinine, Ser 1.35; Hemoglobin 9.8; Platelets 265.0; Potassium 4.2; Sodium 133    Lipid Panel    Component Value Date/Time   CHOL 134 04/19/2021 0951   TRIG 245.0 (H) 04/19/2021 0951   HDL 35.50 (L) 04/19/2021 0951   CHOLHDL 4 04/19/2021 0951   VLDL 49.0 (H) 04/19/2021 0951   LDLCALC 68 12/04/2016 0808   LDLDIRECT 72.0 04/19/2021 0951      Wt Readings from Last 3 Encounters:  04/19/23 132 lb 8 oz (60.1 kg)  10/31/22 134 lb (60.8 kg)  10/12/22 134 lb 12 oz (61.1 kg)           No data to display            ASSESSMENT AND PLAN:  1.  Coronary artery disease involving native coronary arteries with stable angina: She is doing well at the present time with no anginal symptoms.  Continue medical therapy.  She is on clopidogrel.  I requested routine labs.  2.  Moderate aortic stenosis.  I requested a follow-up echocardiogram to be done in March 2025.   3.   Essential hypertension: Blood pressure is well-controlled on current medications.   4.  Hyperlipidemia: Continue treatment with atorvastatin 40 mg once daily.  I requested follow-up labs including lipid and liver profile.  5.  Chronic diastolic heart failure: She appears to be euvolemic without a diuretic.    Disposition:   FU with me in 6 months.  Signed,  Lorine Bears, MD  04/19/2023 1:53 PM    [facility]

## 2023-04-19 NOTE — Patient Instructions (Signed)
Medication Instructions:  No changes *If you need a refill on your cardiac medications before your next appointment, please call your pharmacy*   Lab Work: Your provider would like for you to have following labs drawn today CBC, CMET, Lipid.   If you have labs (blood work) drawn today and your tests are completely normal, you will receive your results only by: MyChart Message (if you have MyChart) OR A paper copy in the mail If you have any lab test that is abnormal or we need to change your treatment, we will call you to review the results.   Testing/Procedures: Your physician has requested that you have an echocardiogram in March 2025. Echocardiography is a painless test that uses sound waves to create images of your heart. It provides your doctor with information about the size and shape of your heart and how well your heart's chambers and valves are working.   You may receive an ultrasound enhancing agent through an IV if needed to better visualize your heart during the echo. This procedure takes approximately one hour.  There are no restrictions for this procedure.  This will take place at [address] [facility]) [address], [place] [address]  Please note: We ask at that you not bring children with you during ultrasound (echo/ vascular) testing. Due to room size and safety concerns, children are not allowed in the ultrasound rooms during exams. Our front office staff cannot provide observation of children in our lobby area while testing is being conducted. An adult accompanying a patient to their appointment will only be allowed in the ultrasound room at the discretion of the ultrasound technician under special circumstances. We apologize for any inconvenience.    Follow-Up: At [facility], you and your health needs are our priority.  As part of our continuing mission to provide you with exceptional heart care, we have created designated Provider Care Teams.   These Care Teams include your primary Cardiologist (physician) and Advanced Practice Providers (APPs -  Physician Assistants and Nurse Practitioners) who all work together to provide you with the care you need, when you need it.  We recommend signing up for the patient portal called "MyChart".  Sign up information is provided on this After Visit Summary.  MyChart is used to connect with patients for Virtual Visits (Telemedicine).  Patients are able to view lab/test results, encounter notes, upcoming appointments, etc.  Non-urgent messages can be sent to your provider as well.   To learn more about what you can do with MyChart, go to [email or web address].    Your next appointment:   6 month(s)  Provider:   You may see Lorine Bears, MD or one of the following Advanced Practice Providers on your designated Care Team:   Nicolasa Ducking, NP Eula Listen, PA-C Cadence Fransico Michael, PA-C Charlsie Quest, NP Carlos Levering, NP

## 2023-04-20 LAB — COMPREHENSIVE METABOLIC PANEL
ALT: 17 [IU]/L (ref 0–32)
AST: 12 [IU]/L (ref 0–40)
Albumin: 4 g/dL (ref 3.6–4.6)
Alkaline Phosphatase: 121 [IU]/L (ref 44–121)
BUN/Creatinine Ratio: 28 (ref 12–28)
BUN: 35 mg/dL (ref 10–36)
Bilirubin Total: 0.2 mg/dL (ref 0.0–1.2)
CO2: 18 mmol/L — ABNORMAL LOW (ref 20–29)
Calcium: 9.3 mg/dL (ref 8.7–10.3)
Chloride: 103 mmol/L (ref 96–106)
Creatinine, Ser: 1.27 mg/dL — ABNORMAL HIGH (ref 0.57–1.00)
Globulin, Total: 2.4 g/dL (ref 1.5–4.5)
Glucose: 248 mg/dL — ABNORMAL HIGH (ref 70–99)
Potassium: 4.7 mmol/L (ref 3.5–5.2)
Sodium: 139 mmol/L (ref 134–144)
Total Protein: 6.4 g/dL (ref 6.0–8.5)
eGFR: 38 mL/min/{1.73_m2} — ABNORMAL LOW (ref >59)

## 2023-04-20 LAB — LIPID PANEL
Chol/HDL Ratio: 3.3 {ratio} (ref 0.0–4.4)
Cholesterol, Total: 125 mg/dL (ref 100–199)
HDL: 38 mg/dL — ABNORMAL LOW (ref >39)
LDL Chol Calc (NIH): 56 mg/dL (ref 0–99)
Triglycerides: 185 mg/dL — ABNORMAL HIGH (ref 0–149)
VLDL Cholesterol Cal: 31 mg/dL (ref 5–40)

## 2023-04-20 LAB — CBC
Hematocrit: 32.1 % — ABNORMAL LOW (ref 34.0–46.6)
Hemoglobin: 10.3 g/dL — ABNORMAL LOW (ref 11.1–15.9)
MCH: 27.9 pg (ref 26.6–33.0)
MCHC: 32.1 g/dL (ref 31.5–35.7)
MCV: 87 fL (ref 79–97)
Platelets: 183 10*3/uL (ref 150–450)
RBC: 3.69 x10E6/uL — ABNORMAL LOW (ref 3.77–5.28)
RDW: 13.2 % (ref 11.7–15.4)
WBC: 10.1 10*3/uL (ref 3.4–10.8)

## 2023-04-23 ENCOUNTER — Encounter: Payer: Self-pay | Admitting: *Deleted

## 2023-04-24 ENCOUNTER — Other Ambulatory Visit: Payer: Self-pay | Admitting: Cardiovascular Disease

## 2023-04-24 NOTE — Telephone Encounter (Signed)
last office visit: 04/19/23, plan to f/u in 6 months  next office visit:  none/active recall

## 2023-05-09 ENCOUNTER — Ambulatory Visit: Payer: Medicare Other | Admitting: Internal Medicine

## 2023-05-09 VITALS — BP 130/70 | HR 73 | Temp 98.0°F | Resp 16 | Ht 61.0 in | Wt 133.0 lb

## 2023-05-09 DIAGNOSIS — E785 Hyperlipidemia, unspecified: Secondary | ICD-10-CM | POA: Diagnosis not present

## 2023-05-09 DIAGNOSIS — I060 Rheumatic aortic stenosis: Secondary | ICD-10-CM

## 2023-05-09 DIAGNOSIS — E1169 Type 2 diabetes mellitus with other specified complication: Secondary | ICD-10-CM

## 2023-05-09 DIAGNOSIS — I252 Old myocardial infarction: Secondary | ICD-10-CM

## 2023-05-09 DIAGNOSIS — I5032 Chronic diastolic (congestive) heart failure: Secondary | ICD-10-CM

## 2023-05-09 DIAGNOSIS — N189 Chronic kidney disease, unspecified: Secondary | ICD-10-CM

## 2023-05-09 DIAGNOSIS — N1832 Chronic kidney disease, stage 3b: Secondary | ICD-10-CM

## 2023-05-09 DIAGNOSIS — D649 Anemia, unspecified: Secondary | ICD-10-CM | POA: Diagnosis not present

## 2023-05-09 DIAGNOSIS — N3944 Nocturnal enuresis: Secondary | ICD-10-CM

## 2023-05-09 DIAGNOSIS — E1121 Type 2 diabetes mellitus with diabetic nephropathy: Secondary | ICD-10-CM | POA: Diagnosis not present

## 2023-05-09 DIAGNOSIS — Z862 Personal history of diseases of the blood and blood-forming organs and certain disorders involving the immune mechanism: Secondary | ICD-10-CM

## 2023-05-09 DIAGNOSIS — J849 Interstitial pulmonary disease, unspecified: Secondary | ICD-10-CM

## 2023-05-09 DIAGNOSIS — R2681 Unsteadiness on feet: Secondary | ICD-10-CM

## 2023-05-09 DIAGNOSIS — R8290 Unspecified abnormal findings in urine: Secondary | ICD-10-CM

## 2023-05-09 DIAGNOSIS — Z961 Presence of intraocular lens: Secondary | ICD-10-CM | POA: Diagnosis not present

## 2023-05-09 DIAGNOSIS — Z794 Long term (current) use of insulin: Secondary | ICD-10-CM

## 2023-05-09 LAB — POCT GLYCOSYLATED HEMOGLOBIN (HGB A1C): Hemoglobin A1C: 7.8 % — AB (ref 4.0–5.6)

## 2023-05-09 MED ORDER — CONTOUR NEXT TEST VI STRP
ORAL_STRIP | 5 refills | Status: AC
Start: 2023-05-09 — End: ?

## 2023-05-09 MED ORDER — LANCETS MISC. MISC
1.0000 | Freq: Three times a day (TID) | 0 refills | Status: AC
Start: 2023-05-09 — End: 2023-06-08

## 2023-05-09 MED ORDER — BLOOD GLUCOSE MONITORING SUPPL DEVI
1.0000 | Freq: Three times a day (TID) | 0 refills | Status: AC
Start: 2023-05-09 — End: ?

## 2023-05-09 NOTE — Progress Notes (Signed)
 Subjective:  Patient ID: Maureen Morales, female    DOB: 01/20/1923  Age: 89 y.o. MRN: [id number]  CC: The primary encounter diagnosis was Type II diabetes mellitus with nephropathy (HCC). Diagnoses of Chronic diastolic heart failure (HCC), ILD (interstitial lung disease) (HCC), CKD stage G3b/A1, GFR 30-44 and albumin  creatinine ratio <30 mg/g (HCC), History of anemia due to CKD, Hyperlipidemia associated with type 2 diabetes mellitus (HCC), History of non-ST elevation myocardial infarction (NSTEMI), Unsteady gait, Urinary incontinence, nocturnal enuresis, Rheumatic aortic stenosis, and Normocytic anemia were also pertinent to this visit.   HPI Maureen Morales presents for  Chief Complaint  Patient presents with   Medical Management of Chronic Issues    Maureen Morales is a delightful 89 yr old female wiith a history of  type 2 Diabetes with CKD/nephropathy,  CAD with aortic stenosis and labile hypertension,  urinary urge incontinence, frequent UTIs, and osteoporosis with prior fractures who  presents today for diabetes follow up.   Last seen March 024. She is attended by her home health aide Maureen Morales.   Maureen Morales is of [place] descent.  She follows  a Mediterranean diet most of the time ,  diet includes home made pita bread,  home made yogurt (both eaten daily),  fresh fruit (berries,  oranges) daily but she does admit that she has a sweet tooth and eats a slice of homemade fruitcake most days..  She lives at home with 24 hours of assistance by a revolving staff of female aides,  and 3 of her 4 adult children liver nearby.  She is nonambulatory due to recurrent fractures of right hip but is able to propel herself for short distances by scuffing her feet.  Her aide has checked her BS periodically and her fasting glucose  has averaged 140. Post prandials have not been checked.  Maureen Morales takes glipizide  5 mg bid, for years,  attempts to increase dose causes hypoglycemia,.  Prior trials of metformin  caused persistent  nausea and explosive diarrhea. She is nonambulatory due to repeated falls resulting in multiple femur fractures   Lab Results  Component Value Date   HGBA1C 7.8 (A) 05/09/2023     Outpatient Medications Prior to Visit  Medication Sig Dispense Refill   acetaminophen  (TYLENOL ) 500 MG tablet Take 1,000 mg by mouth every 6 (six) hours as needed for moderate pain or headache.     albuterol  (PROVENTIL ) (2.5 MG/3ML) 0.083% nebulizer solution Take 3 mLs (2.5 mg total) by nebulization every 6 (six) hours as needed for wheezing or shortness of breath. 75 mL 12   albuterol  (VENTOLIN  HFA) 108 (90 Base) MCG/ACT inhaler Inhale 2 puffs into the lungs every 6 (six) hours as needed for wheezing or shortness of breath. 18 g 3   atorvastatin  (LIPITOR) 40 MG tablet TAKE 1 TABLET BY MOUTH DAILY 90 tablet 1   carvedilol  (COREG ) 6.25 MG tablet TAKE ONE TABLET BY MOUTH TWICE DAILY WITH A MEAL 180 tablet 1   clopidogrel  (PLAVIX ) 75 MG tablet TAKE ONE TABLET BY MOUTH EVERY DAY 90 tablet 1   COVID-19 Home Collection Test KIT Use as needed for signs and symptoms of COVID INFECTION 4 kit 1   CRANBERRY PO Take 2 capsules by mouth 2 (two) times daily.     famotidine (PEPCID) 10 MG tablet Take 10 mg by mouth daily as needed for heartburn or indigestion.     FEROSUL 325 (65 Fe) MG tablet TAKE 1 TABLET BY MOUTH DAILY 90 tablet 0   glipiZIDE  (GLUCOTROL )  5 MG tablet TAKE ONE TABLET TWICE A DAY BEFORE MEALS 180 tablet 1   hydrALAZINE  (APRESOLINE ) 25 MG tablet TAKE 1 TABLET BY MOUTH 3 TIMES DAILY. 90 tablet 2   isosorbide  mononitrate (IMDUR ) 30 MG 24 hr tablet TAKE ONE TABLET BY MOUTH EVERY MORNING AND TWO TABLETS EVERY EVENING 270 tablet 0   Lactobacillus (ULTIMATE PROBIOTIC FORMULA) CAPS Take 1 capsule by mouth daily with supper.     lansoprazole  (PREVACID ) 30 MG capsule Take 1 capsule (30 mg total) by mouth 2 (two) times daily before a meal. 180 capsule 0   loratadine  (CLARITIN ) 10 MG tablet Take 10 mg daily by mouth.       losartan  (COZAAR ) 100 MG tablet TAKE ONE TABLET BY MOUTH EVERY DAY 90 tablet 1   nitroGLYCERIN  (NITROSTAT ) 0.4 MG SL tablet Place 1 tablet (0.4 mg total) under the tongue every 5 (five) minutes as needed for chest pain. 10 tablet 2   sertraline  (ZOLOFT ) 100 MG tablet TAKE 1 TABLET BY MOUTH DAILY. 30 tablet 3   Trospium Chloride 60 MG CP24 TAKE ONE CAPSULE EACH MORNING BEFORE BREAKFAST 90 capsule 1   vitamin C  (ASCORBIC ACID ) 500 MG tablet Take 1 tablet (500 mg total) by mouth 2 (two) times daily. 60 tablet 2   VITAMIN D  PO Take 1 capsule by mouth in the morning.     CONTOUR NEXT TEST test strip USE AS DIRECTED TWICE A DAY 100 each 5   No facility-administered medications prior to visit.    Review of Systems;  Patient denies headache, fevers, malaise, unintentional weight loss, skin rash, eye pain, sinus congestion and sinus pain, sore throat, dysphagia,  hemoptysis , cough, dyspnea, wheezing, chest pain, palpitations, orthopnea, edema, abdominal pain, nausea, melena, diarrhea, constipation, flank pain, dysuria, hematuria, urinary  Frequency, nocturia, numbness, tingling, seizures,  Focal weakness, Loss of consciousness,  Tremor, insomnia, depression, anxiety, and suicidal ideation.      Objective:  BP 130/70   Pulse 73   Temp 98 F (36.7 C)   Resp 16   Ht 5' 1 (1.549 m)   Wt 133 lb (60.3 kg)   SpO2 97%   BMI 25.13 kg/m   BP Readings from Last 3 Encounters:  05/09/23 130/70  04/19/23 130/70  10/31/22 118/80    Wt Readings from Last 3 Encounters:  05/09/23 133 lb (60.3 kg)  04/19/23 132 lb 8 oz (60.1 kg)  10/31/22 134 lb (60.8 kg)    Physical Exam Vitals reviewed.  Constitutional:      General: She is not in acute distress.    Appearance: Normal appearance. She is normal weight. She is not ill-appearing, toxic-appearing or diaphoretic.  HENT:     Head: Normocephalic.  Eyes:     General: No scleral icterus.       Right eye: No discharge.        Left eye: No discharge.      Conjunctiva/sclera: Conjunctivae normal.  Cardiovascular:     Rate and Rhythm: Normal rate and regular rhythm.     Heart sounds: Normal heart sounds.  Pulmonary:     Effort: Pulmonary effort is normal. No respiratory distress.     Breath sounds: Normal breath sounds.  Musculoskeletal:        General: Normal range of motion.  Skin:    General: Skin is warm and dry.  Neurological:     General: No focal deficit present.     Mental Status: She is alert and oriented to person,  place, and time. Mental status is at baseline.  Psychiatric:        Mood and Affect: Mood normal.        Behavior: Behavior normal.        Thought Content: Thought content normal.        Judgment: Judgment normal.    Lab Results  Component Value Date   HGBA1C 7.8 (A) 05/09/2023   HGBA1C 7.3 (H) 06/30/2022   HGBA1C 7.5 (H) 01/04/2022    Lab Results  Component Value Date   CREATININE 1.27 (H) 04/19/2023   CREATININE 1.35 (H) 07/13/2022   CREATININE 1.87 (H) 07/10/2022    Lab Results  Component Value Date   WBC 10.1 04/19/2023   HGB 10.3 (L) 04/19/2023   HCT 32.1 (L) 04/19/2023   PLT 183 04/19/2023   GLUCOSE 248 (H) 04/19/2023   CHOL 125 04/19/2023   TRIG 185 (H) 04/19/2023   HDL 38 (L) 04/19/2023   LDLDIRECT 72.0 04/19/2021   LDLCALC 56 04/19/2023   ALT 17 04/19/2023   AST 12 04/19/2023   NA 139 04/19/2023   K 4.7 04/19/2023   CL 103 04/19/2023   CREATININE 1.27 (H) 04/19/2023   BUN 35 04/19/2023   CO2 18 (L) 04/19/2023   TSH 4.12 01/04/2022   INR 1.2 07/07/2022   HGBA1C 7.8 (A) 05/09/2023   MICROALBUR 4.8 (H) 07/13/2022    MR Lumbar Spine w/o contrast Result Date: 02/20/2023 CLINICAL DATA:  Spinal stenosis, lumbar. Chronic low back pain. Difficulty walking over the last year. EXAM: MRI LUMBAR SPINE WITHOUT CONTRAST TECHNIQUE: Multiplanar, multisequence MR imaging of the lumbar spine was performed. No intravenous contrast was administered. COMPARISON:  Radiography 04/29/2018  FINDINGS: Segmentation:  5 lumbar type vertebral bodies assumed. Alignment: Mild scoliotic curvature convex to the right. 2-3 mm of retrolisthesis at L1-2, L2-3 and L3-4. 5 mm anterolisthesis at L4-5. 4 mm anterolisthesis at L5-S1. Vertebrae: Degenerative discogenic endplate marrow changes at L1-2 and L2-3. Old minor superior endplate fracture at L4, completely healed. Conus medullaris and cauda equina: Conus extends to the L1-2 level. Conus and cauda equina appear normal. Paraspinal and other soft tissues: Chronic atrophy of the right ileo psoas musculature. Disc levels: T11-12 and T12-L1: Minimal noncompressive disc bulges. L1-2: 2 mm retrolisthesis. Mild bulging of the disc. No compressive stenosis. L2-3: 3 mm retrolisthesis. Bulging of the disc. Facet hypertrophy. Mild stenosis of the lateral recesses and foramina, left worse than right. L3-4: 2 mm retrolisthesis. Bulging of the disc. Facet and ligamentous hypertrophy. Moderate stenosis of the canal, lateral recesses and foramina left more than right. L4-5: Chronic facet arthropathy with 5 mm of anterolisthesis. Mild bulging of the disc. Stenosis of both lateral recesses and neural foramina. Neural compression could occur at this level. L5-S1: Chronic facet arthropathy with 4 mm of degenerative anterolisthesis. Mild bulging of the disc. Narrowing of the subarticular lateral recesses and both neural foramina that could cause neural compression, more likely on the right. IMPRESSION: 1. Scoliotic curvature convex to the right. 2-3 mm of retrolisthesis at L1-2, L2-3 and L3-4. 5 mm of anterolisthesis at L4-5 and 4 mm of anterolisthesis at L5-S1. 2. L2-3: Mild stenosis of the lateral recesses and foramina, left worse than right. 3. L3-4: Moderate stenosis of the canal, lateral recesses and foramina left more than right. 4. L4-5: Stenosis of both lateral recesses and neural foramina that could cause neural compression. 5. L5-S1: Stenosis of the subarticular lateral  recesses and both neural foramina that could cause neural compression, more  likely on the right. Electronically Signed   By: Oneil Officer M.D.   On: 02/20/2023 16:04    Assessment & Plan:  .Type II diabetes mellitus with nephropathy (HCC) Assessment & Plan: Adequate control given her age;    would benefit from SGLT 2 inhibitor but her urinary incontinence would be aggravated and likely increase her risk for skin infections.  Continue glipizide  at current dose,  statin,  and ARB.     Lab Results  Component Value Date   HGBA1C 7.8 (A) 05/09/2023   Lab Results  Component Value Date   MICROALBUR 4.8 (H) 07/13/2022   MICROALBUR 5.8 (H) 01/04/2022       Orders: -     POCT glycosylated hemoglobin (Hb A1C) -     Contour Next Test; Use once daily as directed  Dispense: 100 each; Refill: 5 -     Blood Glucose Monitoring Suppl; 1 each by Does not apply route in the morning, at noon, and at bedtime. May substitute to any manufacturer covered by patient's insurance.  Dispense: 1 each; Refill: 0 -     Lancets Misc.; 1 each by Does not apply route in the morning, at noon, and at bedtime. May substitute to any manufacturer covered by patient's insurance.  Dispense: 100 each; Refill: 0  Chronic diastolic heart failure (HCC)  ILD (interstitial lung disease) (HCC)  CKD stage G3b/A1, GFR 30-44 and albumin  creatinine ratio <30 mg/g (HCC)  History of anemia due to CKD Assessment & Plan: Hgb is above threshold for epogen.   No further workup given her age.   Lab Results  Component Value Date   WBC 10.1 04/19/2023   HGB 10.3 (L) 04/19/2023   HCT 32.1 (L) 04/19/2023   MCV 87 04/19/2023   PLT 183 04/19/2023   Lab Results  Component Value Date   IRON  29 07/07/2022   TIBC 221 (L) 07/07/2022   FERRITIN 205 07/07/2022     Hyperlipidemia associated with type 2 diabetes mellitus (HCC) Assessment & Plan: LDL is 56 , at  goal on current dose of atorvastatin .  No changes today   Lab Results   Component Value Date   CHOL 125 04/19/2023   HDL 38 (L) 04/19/2023   LDLCALC 56 04/19/2023   LDLDIRECT 72.0 04/19/2021   TRIG 185 (H) 04/19/2023   CHOLHDL 3.3 04/19/2023      History of non-ST elevation myocardial infarction (NSTEMI) Assessment & Plan: She was admitted in March 2024 with a troponin of > 500 after having angina relieved with ntg x 1.  She was treated with IV heparin  x 48 hours  and discharged on home dose of plavix .    Unsteady gait Assessment & Plan: Secondary to hip pain and bilateral leg weakness.  She requires the use of a wheelchair for locomotion.  She is unable to propel herself distances > 10 feet but has 24 hour care for assistance. In her home    Urinary incontinence, nocturnal enuresis Assessment & Plan: She is continent at night unless she has  UTI   Rheumatic aortic stenosis Assessment & Plan: moderate by ECHO done during hospitalization, now with mild to moderate mitral valve regurgitation.  Maureen Morales  Historically, attempts to lower BP to 130/80 or less have resulted in weakness and fatigue .  CURRENTLY ASYMPTOMATIC AND WELL CONTROLLED  on losartan , hydralazine , Indur and carvedilol  .     Normocytic anemia Assessment & Plan: mUltifactorial with CKD and low iron ;    hgb is stalbe.  Continue iron .   Lab Results  Component Value Date   WBC 10.1 04/19/2023   HGB 10.3 (L) 04/19/2023   HCT 32.1 (L) 04/19/2023   MCV 87 04/19/2023   PLT 183 04/19/2023   Lab Results  Component Value Date   IRON  29 07/07/2022   TIBC 221 (L) 07/07/2022   FERRITIN 205 07/07/2022         I provided 35 minutes of face-to-face time during this encounter reviewing patient's last visit with me, patient's  most recent visit with cardiology,   previous non surgical procedures, previous  labs and imaging studies, counseling on currently addressed issues,  and post visit ordering to diagnostics and therapeutics .   Follow-up: No follow-ups on file.   Maureen LITTIE Kettering, MD

## 2023-05-09 NOTE — Patient Instructions (Addendum)
 Your A1c is 7.8,  when it gets above 8   we will need to add more medication   For now , I want you to check your blood sugar once a day  2 hours after  your sweet  treat  to see which ones are rasing your sugars too much   No  changes to medication  today

## 2023-05-10 NOTE — Assessment & Plan Note (Signed)
 Adequate control given her age;    would benefit from SGLT 2 inhibitor but her urinary incontinence would be aggravated and likely increase her risk for skin infections.  Continue glipizide  at current dose,  statin,  and ARB.     Lab Results  Component Value Date   HGBA1C 7.8 (A) 05/09/2023   Lab Results  Component Value Date   MICROALBUR 4.8 (H) 07/13/2022   MICROALBUR 5.8 (H) 01/04/2022

## 2023-05-10 NOTE — Assessment & Plan Note (Signed)
 moderate by ECHO done during hospitalization, now with mild to moderate mitral valve regurgitation.  SABRA  Historically, attempts to lower BP to 130/80 or less have resulted in weakness and fatigue .  CURRENTLY ASYMPTOMATIC AND WELL CONTROLLED  on losartan , hydralazine , Indur and carvedilol  .

## 2023-05-10 NOTE — Assessment & Plan Note (Signed)
 LDL is 56 , at  goal on current dose of atorvastatin .  No changes today   Lab Results  Component Value Date   CHOL 125 04/19/2023   HDL 38 (L) 04/19/2023   LDLCALC 56 04/19/2023   LDLDIRECT 72.0 04/19/2021   TRIG 185 (H) 04/19/2023   CHOLHDL 3.3 04/19/2023

## 2023-05-10 NOTE — Assessment & Plan Note (Signed)
 She is continent at night unless she has  UTI

## 2023-05-10 NOTE — Assessment & Plan Note (Signed)
 mUltifactorial with CKD and low iron ;    hgb is stalbe.  Continue iron .   Lab Results  Component Value Date   WBC 10.1 04/19/2023   HGB 10.3 (L) 04/19/2023   HCT 32.1 (L) 04/19/2023   MCV 87 04/19/2023   PLT 183 04/19/2023   Lab Results  Component Value Date   IRON  29 07/07/2022   TIBC 221 (L) 07/07/2022   FERRITIN 205 07/07/2022

## 2023-05-10 NOTE — Assessment & Plan Note (Addendum)
 Hgb is above threshold for epogen.   No further workup given her age.   Lab Results  Component Value Date   WBC 10.1 04/19/2023   HGB 10.3 (L) 04/19/2023   HCT 32.1 (L) 04/19/2023   MCV 87 04/19/2023   PLT 183 04/19/2023   Lab Results  Component Value Date   IRON  29 07/07/2022   TIBC 221 (L) 07/07/2022   FERRITIN 205 07/07/2022

## 2023-05-10 NOTE — Assessment & Plan Note (Signed)
 Secondary to hip pain and bilateral leg weakness.  She requires the use of a wheelchair for locomotion.  She is unable to propel herself distances > 10 feet but has 24 hour care for assistance. In her home

## 2023-05-10 NOTE — Assessment & Plan Note (Signed)
 She was admitted in March 2024 with a troponin of > 500 after having angina relieved with ntg x 1.  She was treated with IV heparin x 48 hours  and discharged on home dose of plavix.

## 2023-05-29 ENCOUNTER — Other Ambulatory Visit: Payer: Self-pay | Admitting: Cardiovascular Disease

## 2023-06-05 ENCOUNTER — Other Ambulatory Visit: Payer: Self-pay | Admitting: Cardiovascular Disease

## 2023-06-13 DIAGNOSIS — Z23 Encounter for immunization: Secondary | ICD-10-CM | POA: Diagnosis not present

## 2023-06-20 ENCOUNTER — Other Ambulatory Visit: Payer: Self-pay | Admitting: Cardiovascular Disease

## 2023-06-21 ENCOUNTER — Other Ambulatory Visit: Payer: Self-pay | Admitting: Internal Medicine

## 2023-07-04 ENCOUNTER — Ambulatory Visit: Payer: Medicare Other | Attending: Cardiovascular Disease

## 2023-07-04 ENCOUNTER — Other Ambulatory Visit: Payer: Self-pay | Admitting: Internal Medicine

## 2023-07-04 DIAGNOSIS — I350 Nonrheumatic aortic (valve) stenosis: Secondary | ICD-10-CM | POA: Diagnosis not present

## 2023-07-04 LAB — ECHOCARDIOGRAM COMPLETE
AR max vel: 1.1 cm2
AV Area VTI: 1.09 cm2
AV Area mean vel: 1.21 cm2
AV Mean grad: 27 mmHg
AV Peak grad: 53.3 mmHg
Ao pk vel: 3.65 m/s
Area-P 1/2: 2.66 cm2
S' Lateral: 1.6 cm

## 2023-07-09 ENCOUNTER — Other Ambulatory Visit: Payer: Medicare Other

## 2023-07-17 ENCOUNTER — Other Ambulatory Visit: Payer: Self-pay | Admitting: Internal Medicine

## 2023-07-17 NOTE — Telephone Encounter (Signed)
 This was refill 1 week ago. Please refuse refill request.

## 2023-07-21 ENCOUNTER — Other Ambulatory Visit: Payer: Self-pay | Admitting: Internal Medicine

## 2023-07-26 ENCOUNTER — Other Ambulatory Visit: Payer: Self-pay | Admitting: Internal Medicine

## 2023-08-02 ENCOUNTER — Other Ambulatory Visit: Payer: Self-pay | Admitting: Internal Medicine

## 2023-08-02 DIAGNOSIS — J069 Acute upper respiratory infection, unspecified: Secondary | ICD-10-CM

## 2023-08-02 MED ORDER — AZITHROMYCIN 250 MG PO TABS: ORAL_TABLET | ORAL | 0 refills | Status: AC

## 2023-08-02 MED ORDER — PREDNISONE 10 MG PO TABS
ORAL_TABLET | ORAL | 0 refills | Status: DC
Start: 2023-08-02 — End: 2023-08-24

## 2023-08-02 MED ORDER — AIRSUPRA 90-80 MCG/ACT IN AERO: 1.0000 | INHALATION_SPRAY | Freq: Four times a day (QID) | RESPIRATORY_TRACT | 11 refills | Status: DC | PRN

## 2023-08-21 ENCOUNTER — Other Ambulatory Visit: Payer: Self-pay | Admitting: Cardiovascular Disease

## 2023-08-23 ENCOUNTER — Telehealth: Payer: Self-pay

## 2023-08-23 DIAGNOSIS — G8929 Other chronic pain: Secondary | ICD-10-CM

## 2023-08-23 NOTE — Addendum Note (Signed)
 Addended by: Thersia Flax on: 08/23/2023 07:23 PM   Modules accepted: Orders

## 2023-08-23 NOTE — Telephone Encounter (Signed)
 Copied from CRM [id number] [id number]. Topic: Clinical - Request for Lab/Test Order >> Aug 23, 2023  3:21 PM Allyne Areola wrote: Reason for CRM: Patient's daughter is call requesting an order for an x-ray of her right leg due to pain she has been experiencing. I advised that an appointment would be required but she states that Dr.Tullos is aware of the concerns.

## 2023-08-24 ENCOUNTER — Ambulatory Visit
Admission: RE | Admit: 2023-08-24 | Discharge: 2023-08-24 | Disposition: A | Discharge status: Home / Self Care | Source: Ambulatory Visit | Attending: Internal Medicine | Admitting: Internal Medicine

## 2023-08-24 ENCOUNTER — Ambulatory Visit
Admission: RE | Admit: 2023-08-24 | Discharge: 2023-08-24 | Disposition: A | Discharge status: Home / Self Care | Attending: Internal Medicine | Admitting: Internal Medicine

## 2023-08-24 ENCOUNTER — Other Ambulatory Visit: Payer: Self-pay | Admitting: Internal Medicine

## 2023-08-24 DIAGNOSIS — M25551 Pain in right hip: Secondary | ICD-10-CM | POA: Diagnosis not present

## 2023-08-24 DIAGNOSIS — Z96641 Presence of right artificial hip joint: Secondary | ICD-10-CM | POA: Diagnosis not present

## 2023-08-24 DIAGNOSIS — G8929 Other chronic pain: Secondary | ICD-10-CM

## 2023-08-24 DIAGNOSIS — J069 Acute upper respiratory infection, unspecified: Secondary | ICD-10-CM

## 2023-08-24 DIAGNOSIS — Z471 Aftercare following joint replacement surgery: Secondary | ICD-10-CM | POA: Diagnosis not present

## 2023-08-24 MED ORDER — PREDNISONE 10 MG PO TABS
ORAL_TABLET | ORAL | 0 refills | Status: DC
Start: 2023-08-24 — End: 2023-09-19

## 2023-08-24 NOTE — Assessment & Plan Note (Signed)
 Given her life   inability to take NSAIDS,  intolerance to  tylenol  ,  will treat with prednisone 

## 2023-08-24 NOTE — Telephone Encounter (Signed)
 noted

## 2023-08-26 ENCOUNTER — Other Ambulatory Visit: Payer: Self-pay | Admitting: Cardiovascular Disease

## 2023-09-19 ENCOUNTER — Other Ambulatory Visit: Payer: Self-pay | Admitting: Internal Medicine

## 2023-09-19 MED ORDER — PREDNISONE 10 MG PO TABS: ORAL_TABLET | ORAL | 0 refills | Status: DC

## 2023-09-21 ENCOUNTER — Other Ambulatory Visit: Payer: Self-pay | Admitting: Internal Medicine

## 2023-10-10 DIAGNOSIS — D692 Other nonthrombocytopenic purpura: Secondary | ICD-10-CM | POA: Diagnosis not present

## 2023-10-10 DIAGNOSIS — L821 Other seborrheic keratosis: Secondary | ICD-10-CM | POA: Diagnosis not present

## 2023-10-10 DIAGNOSIS — D1801 Hemangioma of skin and subcutaneous tissue: Secondary | ICD-10-CM | POA: Diagnosis not present

## 2023-10-12 ENCOUNTER — Other Ambulatory Visit: Payer: Self-pay | Admitting: Internal Medicine

## 2023-10-13 ENCOUNTER — Other Ambulatory Visit: Payer: Self-pay | Admitting: Internal Medicine

## 2023-10-16 ENCOUNTER — Other Ambulatory Visit: Payer: Self-pay | Admitting: Cardiovascular Disease

## 2023-10-17 ENCOUNTER — Other Ambulatory Visit: Payer: Self-pay | Admitting: Internal Medicine

## 2023-10-17 MED ORDER — FUROSEMIDE 20 MG PO TABS: ORAL_TABLET | ORAL | 0 refills | Status: DC

## 2023-10-23 ENCOUNTER — Encounter: Payer: Self-pay | Admitting: Cardiology

## 2023-10-23 ENCOUNTER — Other Ambulatory Visit: Payer: Self-pay | Admitting: Internal Medicine

## 2023-10-23 ENCOUNTER — Ambulatory Visit: Attending: Cardiology | Admitting: Cardiology

## 2023-10-23 VITALS — BP 114/72 | HR 76 | Ht 61.0 in | Wt 135.0 lb

## 2023-10-23 DIAGNOSIS — I25118 Atherosclerotic heart disease of native coronary artery with other forms of angina pectoris: Secondary | ICD-10-CM | POA: Insufficient documentation

## 2023-10-23 DIAGNOSIS — E785 Hyperlipidemia, unspecified: Secondary | ICD-10-CM | POA: Insufficient documentation

## 2023-10-23 DIAGNOSIS — I10 Essential (primary) hypertension: Secondary | ICD-10-CM | POA: Insufficient documentation

## 2023-10-23 DIAGNOSIS — I5032 Chronic diastolic (congestive) heart failure: Secondary | ICD-10-CM | POA: Insufficient documentation

## 2023-10-23 DIAGNOSIS — I350 Nonrheumatic aortic (valve) stenosis: Secondary | ICD-10-CM | POA: Insufficient documentation

## 2023-10-23 DIAGNOSIS — Z79899 Other long term (current) drug therapy: Secondary | ICD-10-CM | POA: Insufficient documentation

## 2023-10-23 DIAGNOSIS — R600 Localized edema: Secondary | ICD-10-CM | POA: Diagnosis not present

## 2023-10-23 NOTE — Progress Notes (Signed)
 Cardiology Office Note   Date:  10/23/2023  ID:  Ameri, Cahoon 1922/10/11, MRN [id number] PCP: Marylynn Verneita CROME, MD  [facility] Providers Cardiologist:  Deatrice Cage, MD     History of Present Illness Maureen Morales is a 89 y.o. female with past medical history of aortic stenosis, coronary disease, hypertension, hyperlipidemia, type 2 diabetes, deficiency anemia, who is here today for follow-up.   Echocardiogram 05/2020 revealed LVEF 60-65%, moderate left ventricular hypertrophy, G1 DD, mild to moderate mitral regurgitation mild to moderate aortic stenosis with mean gradient of 6 mmHg and a valve area 1.7 cm, nonobstructive coronary artery disease with left heart catheterization with stenosis being 60% in the mid RCA, hypertension with a difficult to control blood pressure due to intolerance to several antihypertensive medications.  She was hospitalized in May 2018 with non-ST elevation myocardial infarction in the setting of uncontrolled hypertension.  High-sensitivity troponin peaked at 5.79.  Echocardiogram showed hyperdynamic LV systolic function, mild aortic stenosis with a mean gradient of 14 mmHg, mild mitral regurgitation, mild pulmonary hypertension, and trivial posterior pericardial effusion.  Patient recently duplex showed no evidence of renal artery stenosis.  She did sustain a right hip fracture in 2018 after a fall that required another surgery after another fall.    She was previously hospitalized in April 2022 with a non-ST elevated myocardial infarction send of hypertensive urgency.  Peak high-sensitivity troponin was 1663, echocardiogram revealed LVEF 60 to 65% medical therapy was recommended with blood pressure control.  She was hospitalized again at [facility] in March 2024 for with chief complaint of chest pain shortness of breath.  She says that she noticed shortness of breath and fatigue for 3 weeks prior.  She had mildly elevated troponin.  She was treated for  atypical pneumonia.  Repeat echocardiogram revealed LVEF 60 to 65%, G1 DD, mild pulmonary hypertension, mild to moderate mitral regurgitation and stable moderate aortic stenosis.   She was last seen in clinic 04/19/2023 by Dr.Arida.  At that time she been doing well with no chest pain, shortness of breath or palpitations.  Blood pressure been controlled at home with minimal lower extremity edema she continue to walk with her walker.  She returns to clinic today accompanied by her daughter.  She states overall she has been doing well she denies any chest pain or shortness of breath, lightheadedness or dizziness, syncope or near syncope.  Her concern today is that she has peripheral edema to her bilateral lower extremities.  She states she recently followed up with Dr.Tullo and was prescribed as needed furosemide .  She has yet to take any of the previously prescribed furosemide .  When I asked that she elevate her extremities she states no she states in the kitchen cooking all day and her feet are down until bedtime. She states that the swelling does improve and is gone when she awakens in the morning.  States that she has been with her current medication regimen and denies any hospitalizations or visits to the emergency department.  ROS: 10 point review of system has been reviewed and considered negative except ones been listed in the HPI  Studies Reviewed EKG Interpretation Date/Time:  Tuesday October 23 2023 15:56:31 EDT Ventricular Rate:  76 PR Interval:  170 QRS Duration:  88 QT Interval:  408 QTC Calculation: 459 R Axis:   85  Text Interpretation: Sinus rhythm Cannot rule out Inferior infarct (cited on or before 19-Apr-2023) ST & T wave abnormality, consider lateral ischemia  When compared with ECG of 19-Apr-2023 11:27, Confirmed by Gerard Frederick ([id number]) on 10/23/2023 4:01:40 PM    2D echo 07/04/2023 1. Left ventricular ejection fraction, by estimation, is 60 to 65%. Left  ventricular ejection  fraction by 3D volume is 61 %. The left ventricle has  normal function. The left ventricle has no regional wall motion  abnormalities. There is moderate left  ventricular hypertrophy. Left ventricular diastolic parameters are  consistent with Grade I diastolic dysfunction (impaired relaxation). The  average left ventricular global longitudinal strain is -11.9 %. The global  longitudinal strain is abnormal.   2. Right ventricular systolic function is normal. The right ventricular  size is normal. There is moderately elevated pulmonary artery systolic  pressure. The estimated right ventricular systolic pressure is 51.8 mmHg.   3. Left atrial size was moderately dilated.   4. The mitral valve is normal in structure. Mild to moderate mitral valve  regurgitation. No evidence of mitral stenosis. Moderate mitral annular  calcification.   5. Tricuspid valve regurgitation is mild to moderate.   6. The aortic valve is calcified. There is moderate calcification of the  aortic valve. Aortic valve regurgitation is not visualized. Moderate to  severe aortic valve stenosis. Aortic valve area, by VTI measures 1.09 cm.   2D echo 07/07/2022 1. Left ventricular ejection fraction, by estimation, is 60 to 65%. The  left ventricle has normal function. The left ventricle has no regional  wall motion abnormalities. There is moderate left ventricular hypertrophy.  Left ventricular diastolic  parameters are consistent with Grade I diastolic dysfunction (impaired  relaxation).   2. Right ventricular systolic function is normal. The right ventricular  size is normal. There is mildly elevated pulmonary artery systolic  pressure. The estimated right ventricular systolic pressure is 36.6 mmHg.   3. Left atrial size was mildly dilated.   4. The mitral valve is normal in structure. Mild to moderate mitral valve  regurgitation. No evidence of mitral stenosis.   5. Tricuspid valve regurgitation is mild to moderate.    6. The aortic valve is normal in structure. There is moderate  calcification of the aortic valve. Aortic valve regurgitation is not  visualized. Moderate aortic valve stenosis. Aortic valve area, by VTI  measures 1.16 cm. Aortic valve mean gradient  measures 25.5 mmHg. Aortic valve Vmax measures 3.40 m/s.   7. The inferior vena cava is normal in size with greater than 50%  respiratory variability, suggesting right atrial pressure of 3 mmHg.   2D echo 08/19/2020 1. Left ventricular ejection fraction, by estimation, is 60 to 65%. The  left ventricle has normal function. The left ventricle has no regional  wall motion abnormalities. There is moderate left ventricular hypertrophy.  Left ventricular diastolic  parameters are consistent with Grade I diastolic dysfunction (impaired  relaxation).   2. Right ventricular systolic function is normal. The right ventricular  size is normal.   3. The mitral valve is normal in structure. Mild to moderate mitral valve  regurgitation.   4. The aortic valve is normal in structure. Aortic valve regurgitation is  not visualized. Mild to moderate aortic valve stenosis. Aortic valve area,  by VTI measures 1.70 cm. Aortic valve mean gradient measures 16.3 mmHg.  Aortic valve Vmax measures 2.60   m/s.  Risk Assessment/Calculations           Physical Exam VS:  BP 114/72   Pulse 76   Ht 5' 1 (1.549 m)   Wt 135 lb (61.2  kg)   SpO2 95%   BMI 25.51 kg/m        Wt Readings from Last 3 Encounters:  10/23/23 135 lb (61.2 kg)  05/09/23 133 lb (60.3 kg)  04/19/23 132 lb 8 oz (60.1 kg)    GEN: Well nourished, well developed in no acute distress NECK: No JVD; No carotid bruits, heart murmur radiates to the bilateral carotids CARDIAC: RRR, III/VI crescendo decrescendo systolic murmur in the aortic area, without rubs or gallops RESPIRATORY:  Clear to auscultation without rales, wheezing or rhonchi  ABDOMEN: Soft, non-tender, non-distended EXTREMITIES: 1+  pitting edema to BLE; No deformity   ASSESSMENT AND PLAN Coronary artery disease of the native coronary artery with stable angina.  EKG today reveals sinus rhythm with old inferior infarct with no acute changes.  She denies any anginal symptoms.  She has continued on clopidogrel  75 mg daily, Imdur  30 mg daily, and atorvastatin  40 mg daily.  No further ischemic testing ordered at this time.  Moderate aortic stenosis with last echocardiogram completed in March 2025 with an LVEF of 60 to 65%, normal function, no RWMA, moderate left ventricular hypertrophy, G1 DD, right ventricular systolic normal functioning in size, moderately elevated pulmonary artery systolic pressures, with estimated right ventricular systolic pressure was 51.8 mmHg, regurgitation, moderate mitral annular calcification, mild to moderate TR, moderate to severe aortic valve stenosis, VTI measuring 1.09 cm, valve mean gradient measured 23 mmHg, V-max measured 3.6 m/s.  Remained stable.  Continue with surveillance echocardiograms, will need echocardiogram in March 2026.  Primary hypertension with blood pressure 114/72.  Blood pressure has remained stable.  She has been continued on carvedilol  6.25 mg twice daily, furosemide  20 mg as needed, hydralazine  25 mg 3 times daily, Imdur  30 mg daily, and losartan  100 mg daily.  She has been encouraged to continue to monitor pressure 1 to 2 hours postmedication administration as well.  Hyperlipidemia where LDL of 56. Remains at goal.  She has been continued on atorvastatin  40 mg daily.  Peripheral edema to the bilateral lower extremities.  Likely component of venous insufficiency as swelling worsens throughout the day as her legs are hanging down and improves overnight when they are elevated.  Unfortunately she does not elevate her legs throughout the day.  She has been encouraged to participate in conservative therapy of elevating her extremities, decreased her sodium intake, for calf pumps, and  compression stockings.  She has been sent for labs today.  Chronic HFpEF with last LVEF of 60-65%.  She denies any shortness of breath, orthopnea, PND.  She has NYHA class I-II symptoms.  She has started with peripheral edema which sounds more like venous insufficiency.  She has been placed on furosemide  by her PCP.  She has not taken any of that as they were worried about her kidney function.  She has not had any labs done since December 2024.  She has been sent for an updated CMP and a CBC today.  She is continued on carvedilol  6.25 mg twice daily.  No additional medications were added today.       Dispo: Patient to return to clinic to see MD/APP in 3 months or sooner if needed.  Signed, Saman Umstead, NP

## 2023-10-23 NOTE — Patient Instructions (Signed)
 Medication Instructions:  Your physician recommends that you continue on your current medications as directed. Please refer to the Current Medication list given to you today.   *If you need a refill on your cardiac medications before your next appointment, please call your pharmacy*  Lab Work: Your provider would like for you to have following labs drawn today CBC, CMP.   If you have labs (blood work) drawn today and your tests are completely normal, you will receive your results only by: MyChart Message (if you have MyChart) OR A paper copy in the mail If you have any lab test that is abnormal or we need to change your treatment, we will call you to review the results.  Testing/Procedures: No test ordered today   Follow-Up: At [facility], you and your health needs are our priority.  As part of our continuing mission to provide you with exceptional heart care, our providers are all part of one team.  This team includes your primary Cardiologist (physician) and Advanced Practice Providers or APPs (Physician Assistants and Nurse Practitioners) who all work together to provide you with the care you need, when you need it.  Your next appointment:   3 month(s)  Provider:   Darron    We recommend signing up for the patient portal called MyChart.  Sign up information is provided on this After Visit Summary.  MyChart is used to connect with patients for Virtual Visits (Telemedicine).  Patients are able to view lab/test results, encounter notes, upcoming appointments, etc.  Non-urgent messages can be sent to your provider as well.   To learn more about what you can do with MyChart, go to [email or web address].

## 2023-10-24 ENCOUNTER — Ambulatory Visit: Payer: Self-pay | Admitting: Cardiology

## 2023-10-24 LAB — COMPREHENSIVE METABOLIC PANEL WITH GFR
ALT: 16 IU/L (ref 0–32)
AST: 14 IU/L (ref 0–40)
Albumin: 3.8 g/dL (ref 3.6–4.6)
Alkaline Phosphatase: 109 IU/L (ref 44–121)
BUN/Creatinine Ratio: 21 (ref 12–28)
BUN: 27 mg/dL (ref 10–36)
Bilirubin Total: 0.4 mg/dL (ref 0.0–1.2)
CO2: 20 mmol/L (ref 20–29)
Calcium: 9.2 mg/dL (ref 8.7–10.3)
Chloride: 100 mmol/L (ref 96–106)
Creatinine, Ser: 1.26 mg/dL — ABNORMAL HIGH (ref 0.57–1.00)
Globulin, Total: 2 g/dL (ref 1.5–4.5)
Glucose: 225 mg/dL — ABNORMAL HIGH (ref 70–99)
Potassium: 4.6 mmol/L (ref 3.5–5.2)
Sodium: 136 mmol/L (ref 134–144)
Total Protein: 5.8 g/dL — ABNORMAL LOW (ref 6.0–8.5)
eGFR: 39 mL/min/{1.73_m2} — ABNORMAL LOW (ref >59)

## 2023-10-24 LAB — CBC
Hematocrit: 32 % — ABNORMAL LOW (ref 34.0–46.6)
Hemoglobin: 10.5 g/dL — ABNORMAL LOW (ref 11.1–15.9)
MCH: 29.6 pg (ref 26.6–33.0)
MCHC: 32.8 g/dL (ref 31.5–35.7)
MCV: 90 fL (ref 79–97)
Platelets: 150 10*3/uL (ref 150–450)
RBC: 3.55 x10E6/uL — ABNORMAL LOW (ref 3.77–5.28)
RDW: 13.3 % (ref 11.7–15.4)
WBC: 7.1 10*3/uL (ref 3.4–10.8)

## 2023-10-24 NOTE — Progress Notes (Signed)
 Hemoglobin has remained stable.  WBCs are not elevated so there is no concern for infection.  Kidney function is also stable.  Continue current medication regimen without current changes.  Continue with as needed furosemide  sparingly.

## 2023-11-05 ENCOUNTER — Other Ambulatory Visit: Payer: Self-pay | Admitting: Internal Medicine

## 2023-11-05 DIAGNOSIS — L03116 Cellulitis of left lower limb: Secondary | ICD-10-CM

## 2023-11-05 MED ORDER — CEPHALEXIN 500 MG PO CAPS
500.0000 mg | ORAL_CAPSULE | Freq: Three times a day (TID) | ORAL | 0 refills | Status: AC
Start: 2023-11-05 — End: 2023-11-12

## 2023-11-09 ENCOUNTER — Other Ambulatory Visit: Payer: Self-pay | Admitting: Internal Medicine

## 2023-11-18 ENCOUNTER — Other Ambulatory Visit: Payer: Self-pay | Admitting: Internal Medicine

## 2023-11-18 DIAGNOSIS — R8290 Unspecified abnormal findings in urine: Secondary | ICD-10-CM

## 2023-11-19 ENCOUNTER — Telehealth: Payer: Self-pay

## 2023-11-19 DIAGNOSIS — R8290 Unspecified abnormal findings in urine: Secondary | ICD-10-CM | POA: Diagnosis not present

## 2023-11-19 LAB — URINALYSIS, ROUTINE W REFLEX MICROSCOPIC
Bilirubin Urine: NEGATIVE
Ketones, ur: NEGATIVE
Nitrite: POSITIVE — AB
Specific Gravity, Urine: 1.02 (ref 1.000–1.030)
Total Protein, Urine: 100 — AB
Urine Glucose: NEGATIVE
Urobilinogen, UA: 0.2 (ref 0.0–1.0)
pH: 6 (ref 5.0–8.0)

## 2023-11-19 NOTE — Telephone Encounter (Signed)
 error

## 2023-11-21 ENCOUNTER — Ambulatory Visit: Payer: Self-pay | Admitting: Internal Medicine

## 2023-11-21 LAB — URINE CULTURE
MICRO NUMBER:: 16724080
SPECIMEN QUALITY:: ADEQUATE

## 2023-11-21 MED ORDER — CIPROFLOXACIN HCL 250 MG PO TABS: 250.0000 mg | ORAL_TABLET | Freq: Two times a day (BID) | ORAL | 0 refills | Status: AC

## 2023-11-28 ENCOUNTER — Other Ambulatory Visit: Payer: Self-pay | Admitting: Internal Medicine

## 2023-11-28 ENCOUNTER — Other Ambulatory Visit: Payer: Self-pay | Admitting: Cardiovascular Disease

## 2023-11-30 NOTE — Telephone Encounter (Signed)
 Rx Refill

## 2023-12-05 ENCOUNTER — Other Ambulatory Visit: Payer: Self-pay | Admitting: Internal Medicine

## 2023-12-05 DIAGNOSIS — Z961 Presence of intraocular lens: Secondary | ICD-10-CM | POA: Diagnosis not present

## 2023-12-05 DIAGNOSIS — H26493 Other secondary cataract, bilateral: Secondary | ICD-10-CM | POA: Diagnosis not present

## 2023-12-12 ENCOUNTER — Other Ambulatory Visit: Payer: Self-pay | Admitting: Internal Medicine

## 2023-12-12 MED ORDER — SYRINGE 25G X 1" 3 ML MISC: 0 refills | Status: AC

## 2023-12-12 MED ORDER — CYANOCOBALAMIN 1000 MCG/ML IJ SOLN: 1000.0000 ug | INTRAMUSCULAR | 0 refills | Status: AC

## 2023-12-17 ENCOUNTER — Other Ambulatory Visit: Payer: Self-pay | Admitting: Internal Medicine

## 2023-12-17 DIAGNOSIS — R0602 Shortness of breath: Secondary | ICD-10-CM

## 2023-12-17 DIAGNOSIS — R079 Chest pain, unspecified: Secondary | ICD-10-CM

## 2023-12-17 DIAGNOSIS — E119 Type 2 diabetes mellitus without complications: Secondary | ICD-10-CM

## 2023-12-17 DIAGNOSIS — R7401 Elevation of levels of liver transaminase levels: Secondary | ICD-10-CM

## 2023-12-17 DIAGNOSIS — I25118 Atherosclerotic heart disease of native coronary artery with other forms of angina pectoris: Secondary | ICD-10-CM

## 2023-12-17 DIAGNOSIS — R5383 Other fatigue: Secondary | ICD-10-CM

## 2023-12-17 DIAGNOSIS — N1832 Chronic kidney disease, stage 3b: Secondary | ICD-10-CM

## 2023-12-19 ENCOUNTER — Other Ambulatory Visit (INDEPENDENT_AMBULATORY_CARE_PROVIDER_SITE_OTHER)

## 2023-12-19 DIAGNOSIS — R7401 Elevation of levels of liver transaminase levels: Secondary | ICD-10-CM

## 2023-12-19 DIAGNOSIS — R5383 Other fatigue: Secondary | ICD-10-CM | POA: Diagnosis not present

## 2023-12-19 DIAGNOSIS — N1832 Chronic kidney disease, stage 3b: Secondary | ICD-10-CM

## 2023-12-19 DIAGNOSIS — E119 Type 2 diabetes mellitus without complications: Secondary | ICD-10-CM

## 2023-12-19 DIAGNOSIS — R0602 Shortness of breath: Secondary | ICD-10-CM | POA: Diagnosis not present

## 2023-12-19 LAB — COMPREHENSIVE METABOLIC PANEL WITH GFR
ALT: 20 U/L (ref 0–35)
AST: 19 U/L (ref 0–37)
Albumin: 3.8 g/dL (ref 3.5–5.2)
Alkaline Phosphatase: 85 U/L (ref 39–117)
BUN: 29 mg/dL — ABNORMAL HIGH (ref 6–23)
CO2: 25 meq/L (ref 19–32)
Calcium: 9 mg/dL (ref 8.4–10.5)
Chloride: 103 meq/L (ref 96–112)
Creatinine, Ser: 1.19 mg/dL (ref 0.40–1.20)
GFR: 37.98 mL/min — ABNORMAL LOW (ref >60.00)
Glucose, Bld: 127 mg/dL — ABNORMAL HIGH (ref 70–99)
Potassium: 4.2 meq/L (ref 3.5–5.1)
Sodium: 138 meq/L (ref 135–145)
Total Bilirubin: 0.4 mg/dL (ref 0.2–1.2)
Total Protein: 6.1 g/dL (ref 6.0–8.3)

## 2023-12-19 LAB — CBC WITH DIFFERENTIAL/PLATELET
Basophils Absolute: 0 K/uL (ref 0.0–0.1)
Basophils Relative: 0.5 % (ref 0.0–3.0)
Eosinophils Absolute: 0.4 K/uL (ref 0.0–0.7)
Eosinophils Relative: 5.7 % — ABNORMAL HIGH (ref 0.0–5.0)
HCT: 30.6 % — ABNORMAL LOW (ref 36.0–46.0)
Hemoglobin: 10.3 g/dL — ABNORMAL LOW (ref 12.0–15.0)
Lymphocytes Relative: 17.1 % (ref 12.0–46.0)
Lymphs Abs: 1.3 K/uL (ref 0.7–4.0)
MCHC: 33.8 g/dL (ref 30.0–36.0)
MCV: 82.8 fl (ref 78.0–100.0)
Monocytes Absolute: 0.7 K/uL (ref 0.1–1.0)
Monocytes Relative: 8.6 % (ref 3.0–12.0)
Neutro Abs: 5.2 K/uL (ref 1.4–7.7)
Neutrophils Relative %: 68.1 % (ref 43.0–77.0)
Platelets: 164 K/uL (ref 150.0–400.0)
RBC: 3.7 Mil/uL — ABNORMAL LOW (ref 3.87–5.11)
RDW: 14 % (ref 11.5–15.5)
WBC: 7.7 K/uL (ref 4.0–10.5)

## 2023-12-19 LAB — HEMOGLOBIN A1C: Hgb A1c MFr Bld: 8.1 % — ABNORMAL HIGH (ref 4.6–6.5)

## 2023-12-19 LAB — BRAIN NATRIURETIC PEPTIDE: Pro B Natriuretic peptide (BNP): 278 pg/mL — ABNORMAL HIGH (ref 0.0–100.0)

## 2023-12-19 LAB — TSH: TSH: 5.12 u[IU]/mL (ref 0.35–5.50)

## 2023-12-20 ENCOUNTER — Ambulatory Visit: Payer: Self-pay | Admitting: Internal Medicine

## 2023-12-20 DIAGNOSIS — E119 Type 2 diabetes mellitus without complications: Secondary | ICD-10-CM

## 2023-12-20 MED ORDER — SITAGLIPTIN PHOSPHATE 25 MG PO TABS: 25.0000 mg | ORAL_TABLET | Freq: Every day | ORAL | 2 refills | Status: DC

## 2023-12-20 NOTE — Assessment & Plan Note (Signed)
 Not at goal on 5 mg glipizide , adding januvia .

## 2023-12-20 NOTE — Telephone Encounter (Signed)
 Patient is returning a call. Relayed the message to the patient. Asked if the patient should take glipizide  twice a day

## 2023-12-25 ENCOUNTER — Other Ambulatory Visit: Payer: Self-pay | Admitting: Internal Medicine

## 2023-12-28 ENCOUNTER — Encounter: Payer: Self-pay | Admitting: Internal Medicine

## 2023-12-28 ENCOUNTER — Other Ambulatory Visit: Payer: Self-pay | Admitting: Internal Medicine

## 2023-12-28 DIAGNOSIS — U071 COVID-19: Secondary | ICD-10-CM | POA: Insufficient documentation

## 2023-12-28 MED ORDER — AZITHROMYCIN 250 MG PO TABS: ORAL_TABLET | ORAL | 0 refills | Status: AC

## 2023-12-28 MED ORDER — GLIPIZIDE 5 MG PO TABS: 5.0000 mg | ORAL_TABLET | Freq: Every day | ORAL | 1 refills | Status: DC

## 2024-01-01 ENCOUNTER — Telehealth: Payer: Self-pay

## 2024-01-01 MED ORDER — LOSARTAN POTASSIUM 100 MG PO TABS: 100.0000 mg | ORAL_TABLET | Freq: Every day | ORAL | 1 refills | Status: AC

## 2024-01-01 NOTE — Addendum Note (Signed)
 Addended by: MARYLYNN VERNEITA CROME on: 01/01/2024 09:10 PM   Modules accepted: Orders

## 2024-01-01 NOTE — Telephone Encounter (Signed)
 Received a fax from [facility] stating that pt's Losartan  has become an inactive rx. They are wanting to know if pt should be taking the losartan  and if so they will need a new rx.

## 2024-01-08 ENCOUNTER — Other Ambulatory Visit: Payer: Self-pay | Admitting: Internal Medicine

## 2024-01-16 ENCOUNTER — Other Ambulatory Visit: Payer: Self-pay | Admitting: Cardiovascular Disease

## 2024-01-23 ENCOUNTER — Encounter: Payer: Self-pay | Admitting: Cardiovascular Disease

## 2024-01-23 ENCOUNTER — Ambulatory Visit: Attending: Cardiovascular Disease | Admitting: Cardiovascular Disease

## 2024-01-23 VITALS — BP 160/60 | HR 73 | Ht 67.0 in | Wt 137.1 lb

## 2024-01-23 DIAGNOSIS — I25118 Atherosclerotic heart disease of native coronary artery with other forms of angina pectoris: Secondary | ICD-10-CM | POA: Diagnosis not present

## 2024-01-23 DIAGNOSIS — I5032 Chronic diastolic (congestive) heart failure: Secondary | ICD-10-CM | POA: Diagnosis present

## 2024-01-23 DIAGNOSIS — I10 Essential (primary) hypertension: Secondary | ICD-10-CM | POA: Insufficient documentation

## 2024-01-23 DIAGNOSIS — I350 Nonrheumatic aortic (valve) stenosis: Secondary | ICD-10-CM | POA: Insufficient documentation

## 2024-01-23 DIAGNOSIS — E785 Hyperlipidemia, unspecified: Secondary | ICD-10-CM | POA: Diagnosis not present

## 2024-01-23 NOTE — Patient Instructions (Signed)
 Medication Instructions:  No changes *If you need a refill on your cardiac medications before your next appointment, please call your pharmacy*  Lab Work: None ordered If you have labs (blood work) drawn today and your tests are completely normal, you will receive your results only by: MyChart Message (if you have MyChart) OR A paper copy in the mail If you have any lab test that is abnormal or we need to change your treatment, we will call you to review the results.  Testing/Procedures: Your physician has requested that you have an echocardiogram in 6 months. Echocardiography is a painless test that uses sound waves to create images of your heart. It provides your doctor with information about the size and shape of your heart and how well your heart's chambers and valves are working.   You may receive an ultrasound enhancing agent through an IV if needed to better visualize your heart during the echo. This procedure takes approximately one hour.  There are no restrictions for this procedure.  This will take place at [address] [facility] [address], [place] [address]  Please note: We ask at that you not bring children with you during ultrasound (echo/ vascular) testing. Due to room size and safety concerns, children are not allowed in the ultrasound rooms during exams. Our front office staff cannot provide observation of children in our lobby area while testing is being conducted. An adult accompanying a patient to their appointment will only be allowed in the ultrasound room at the discretion of the ultrasound technician under special circumstances. We apologize for any inconvenience.   Follow-Up: At [facility], you and your health needs are our priority.  As part of our continuing mission to provide you with exceptional heart care, our providers are all part of one team.  This team includes your primary Cardiologist (physician) and Advanced Practice Providers or  APPs (Physician Assistants and Nurse Practitioners) who all work together to provide you with the care you need, when you need it.  Your next appointment:   6 month(s)  Provider:   You may see Deatrice Cage, MD or one of the following Advanced Practice Providers on your designated Care Team:   Lonni Meager, NP Lesley Maffucci, PA-C Bernardino Bring, PA-C Cadence Auburn, PA-C Tylene Lunch, NP Barnie Hila, NP    We recommend signing up for the patient portal called MyChart.  Sign up information is provided on this After Visit Summary.  MyChart is used to connect with patients for Virtual Visits (Telemedicine).  Patients are able to view lab/test results, encounter notes, upcoming appointments, etc.  Non-urgent messages can be sent to your provider as well.   To learn more about what you can do with MyChart, go to [email or web address].

## 2024-01-23 NOTE — Progress Notes (Signed)
 Cardiology Office Note   Date:  01/23/2024   ID:  [name], Maureen Morales 1922/10/19, MRN [id number]  PCP:  Marylynn Verneita CROME, MD  Cardiologist:   Deatrice Cage, MD   Chief Complaint  Patient presents with   Follow-up    3 month f/u c/o elevated BP when pt caught covid x1 month ago; pt used nitroglycerin  and edema right leg. Meds reviewed verbally with pt.       History of Present Illness: Maureen Morales is a 89 y.o. female who presents for a follow up regarding aortic stenosis and coronary artery disease. She has chronic medical conditions that include hypertension, hyperlipidemia and type 2 diabetes.  She was hospitalized in May of 2018 with non-ST elevation myocardial infarction in the setting of uncontrolled hypertension. Troponin peaked at 5.79. Echocardiogram showed hyperdynamic LV systolic function, mild aortic stenosis with mean gradient of 14 mmHg, mild mitral regurgitation, mild pulmonary hypertension and trivial posterior pericardial effusion. Cardiac catheterization showed mild to moderate nonobstructive coronary artery disease. Worst stenosis was 60% in the mid right coronary artery. Outpatient renal artery duplex showed no evidence of renal artery stenosis. She had right hip fracture in 2018 after a fall and required another surgery after another fall.    She has difficult to control blood pressure with intolerance to antihypertensive medications.  She was hospitalized in April of 2022 with non-ST elevation myocardial infarction in the setting of hypertensive urgency.  Peak high-sensitivity troponin was 1663.  Echocardiogram  showed an ejection fraction of 60 to 65%, moderate left ventricular hypertrophy, grade 1 diastolic dysfunction, mild to moderate mitral regurgitation and mild to moderate aortic stenosis with mean gradient of 16 mmHg and valve area of 1.7 cm.  Medical therapy was recommended with blood pressure control.    She was hospitalized in March of 2024 with  chest pain and shortness of breath and was found to have mildly elevated troponin.  She was treated for atypical pneumonia.  Echocardiogram showed an EF of 60 to 65% with grade 1 diastolic dysfunction, mild pulmonary hypertension, mild to moderate mitral regurgitation and stable moderate aortic stenosis.  She had COVID last month but recovered overall.  She was having fatigue and low energy that it was determined to be due to Zyrtec.  She is doing well now with no chest pain or worsening dyspnea.  Past Medical History:  Diagnosis Date   Aortic stenosis    a. 08/2016 Echo: Hyperdynamic LV fxn, mild AS (mean grad ), mild MR, mild PAH; b. 05/2017 Echo: Hyperdynamic LV fxn, mod AS (mean grad , valve area 1.84). PASP .   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 08/2017 Renal U/S: No RAS.   Low back pain with left-sided sciatica 04/29/2018   Myocardial infarction [facility]) 2018   Non-obstructive CAD (coronary artery disease)    a. 08/2016 NSTEMI/Cath: mild to mod nonobs dzs including 60% mRCA stenosis-->Med rx.   NSTEMI (  non-ST elevated myocardial infarction) (HCC) 08/30/2016   NSTEMI (non-ST elevated myocardial infarction) (HCC) 08/18/2020   Nummular eczema 01/16/2011   Occurring on chest and under breast.  Treated by Dr Dela with fluocinonide 0.05% topical 06/26/12    Peripheral vascular disease    Periprosthetic fracture around internal prosthetic hip joint, initial encounter    PONV (postoperative nausea and vomiting)    Positive  H. pylori test    Pre-diabetes    Shingles rash 03/04/2020   Top of forehead and left forehead , with irritation of left sclera as well.  Seeing [facility] today    Suspected COVID-19 virus infection 01/10/2019   Patient reporting malaise and headache for the past 3 days,  Similar to presentation when she was positive for  COVID 19 INFECTION  July 2 .  Previous inection was caused by exposure to COVID positive caregiver.  Repeat scenario:  Same caregiver's mother tested positive on Monday and caregiver saw current patient on Tuesday     Viral gastroenteritis due to [place]-like agent 03/2011    Past Surgical History:  Procedure Laterality Date   ABDOMINAL HYSTERECTOMY     BLADDER SURGERY     CARDIAC CATHETERIZATION     CATARACT EXTRACTION W/PHACO Right 09/29/2020   Procedure: CATARACT EXTRACTION PHACO AND INTRAOCULAR LENS PLACEMENT (IOC) RIGHT VISION BLUE 7.87 00:58.7 ;  Surgeon: Mittie Gaskin, MD;  Location: [facility];  Service: Ophthalmology;  Laterality: Right;   CATARACT EXTRACTION W/PHACO Left 10/13/2020   Procedure: CATARACT EXTRACTION PHACO AND INTRAOCULAR LENS PLACEMENT (IOC) LEFT VISION BLUE;  Surgeon: Mittie Gaskin, MD;  Location: [facility];  Service: Ophthalmology;  Laterality: Left;  4.94 1:03.0 7.9%   CHOLECYSTECTOMY     COLONOSCOPY     06/16/1987, 02/20/1995, 05/04/1999, 02/14/2005, 05/17/2007   ESOPHAGOGASTRODUODENOSCOPY     05/11/1987, 06/13/1995, 10/09/1995, 05/04/1999, 01/16/2002   FLEXIBLE SIGMOIDOSCOPY N/A 05/02/2017   Procedure: ENID SIGMOIDOSCOPY;  Surgeon: Viktoria Lamar DASEN, MD;  Location: [facility];  Service: Endoscopy;  Laterality: N/A;   HEMORRHOID SURGERY N/A 05/14/2017   Procedure: EXTERNAL THROMBOSED HEMORRHOIDECTOMY;  Surgeon: Dessa Reyes ORN, MD;  Location: [facility] ORS;  Service: General;  Laterality: N/A;   HIP ARTHROPLASTY Right 02/17/2017   Procedure: ARTHROPLASTY BIPOLAR HIP (HEMIARTHROPLASTY);  Surgeon: Romayne Livingston DASEN, MD;  Location: [facility] OR[facility];  Service: Orthopedics;  Laterality: Right;   LEFT HEART CATH AND CORONARY ANGIOGRAPHY N/A 08/31/2016   Procedure: Left Heart Cath and Coronary Angiography;  Surgeon: Deatrice DELENA Cage, MD;  Location: [facility] INVAS[facility];  Service: Cardiovascular;  Laterality: N/A;   ORIF PERIPROSTHETIC FRACTURE Right 05/27/2017   ORIF PERIPROSTHETIC FRACTURE Right 05/27/2017   Procedure: OPEN REDUCTION INTERNAL FIXATION (ORIF) PERIPROSTHETIC FRACTURE;  Surgeon: Vernetta Lonni GRADE, MD;  Location: [facility] OR;  Service: Orthopedics;  Laterality: Right;   TOTAL HIP ARTHROPLASTY Right    uterian prolapse       Current Outpatient Medications  Medication Sig Dispense Refill   acetaminophen  (TYLENOL ) 500 MG tablet Take 1,000 mg by mouth every 6 (six) hours as needed for moderate pain or headache.     albuterol  (PROVENTIL ) (2.5 MG/3ML) 0.083% nebulizer solution Take 3 mLs (2.5 mg total) by nebulization every 6 (six) hours as needed for wheezing or shortness of breath. 75 mL 12   albuterol  (VENTOLIN  HFA) 108 (90 Base) MCG/ACT inhaler Inhale 2 puffs into the lungs every 6 (six) hours as needed for wheezing or shortness of breath. 18 g 3   Albuterol -Budesonide  (AIRSUPRA ) 90-80 MCG/ACT AERO  Inhale 1 puff into the lungs every 6 (six) hours as needed. 5.9 g 11   atorvastatin  (LIPITOR) 40 MG tablet TAKE 1 TABLET BY MOUTH DAILY 90 tablet 0   Blood Glucose Monitoring Suppl DEVI 1 each by Does not apply route in the morning, at noon, and at bedtime. May substitute to any manufacturer covered by patient's insurance. 1 each 0   carvedilol  (COREG ) 6.25 MG tablet TAKE ONE TABLET BY MOUTH TWICE DAILY WITH A MEAL 180 tablet 1   clopidogrel  (PLAVIX ) 75 MG tablet TAKE ONE TABLET BY MOUTH EVERY DAY 90 tablet 1   COVID-19 Home Collection Test KIT Use as needed for signs and symptoms of COVID INFECTION 4 kit 1   CRANBERRY PO Take 2 capsules by mouth 2 (two) times daily.     cyanocobalamin  (VITAMIN B12) 1000  MCG/ML injection Inject 1 mL (1,000 mcg total) into the muscle once a week. For 4 weeks 4 mL 0   famotidine (PEPCID) 10 MG tablet Take 10 mg by mouth daily as needed for heartburn or indigestion.     FEROSUL 325 (65 Fe) MG tablet TAKE 1 TABLET BY MOUTH DAILY 90 tablet 0   furosemide  (LASIX ) 20 MG tablet TAKE 1 TABLET BY MOUTH EVERY DAY AS NEEDED FOR SEVERE FLUID RETENTION 20 tablet 0   glipiZIDE  (GLUCOTROL ) 5 MG tablet Take 1 tablet (5 mg total) by mouth daily before breakfast. 90 tablet 1   glucose blood (CONTOUR NEXT TEST) test strip Use once daily as directed 100 each 5   hydrALAZINE  (APRESOLINE ) 25 MG tablet TAKE 1 TABLET BY MOUTH 3 TIMES DAILY 90 tablet 2   isosorbide  mononitrate (IMDUR ) 30 MG 24 hr tablet TAKE ONE TABLET BY MOUTH EVERY MORNING AND TWO TABLETS EVERY EVENING 270 tablet 0   Lactobacillus (ULTIMATE PROBIOTIC FORMULA) CAPS Take 1 capsule by mouth daily with supper.     lansoprazole  (PREVACID ) 30 MG capsule Take 1 capsule (30 mg total) by mouth 2 (two) times daily before a meal. 180 capsule 0   loratadine  (CLARITIN ) 10 MG tablet Take 10 mg daily by mouth.      losartan  (COZAAR ) 100 MG tablet Take 1 tablet (100 mg total) by mouth daily. 90 tablet 1   nitroGLYCERIN  (NITROSTAT ) 0.4 MG SL tablet Place 1 tablet (0.4 mg total) under the tongue every 5 (five) minutes as needed for chest pain. 10 tablet 2   sertraline  (ZOLOFT ) 100 MG tablet TAKE 1 TABLET BY MOUTH DAILY. 30 tablet 3   sitaGLIPtin  (JANUVIA ) 25 MG tablet Take 1 tablet (25 mg total) by mouth daily. 30 tablet 2   Syringe/Needle, Disp, (SYRINGE 3CC/25GX1) 25G X 1 3 ML MISC Use for b12 injections 50 each 0   Trospium Chloride 60 MG CP24 TAKE ONE CAPSULE EACH MORNING BEFORE BREAKFAST 90 capsule 1   vitamin C  (ASCORBIC ACID ) 500 MG tablet Take 1 tablet (500 mg total) by mouth 2 (two) times daily. 60 tablet 2   VITAMIN D  PO Take 1 capsule by mouth in the morning.     No current facility-administered medications for this visit.     Allergies:   Clindamycin , Oxytetracycline, Phenobarbital, Atropine, Belladonna alkaloids, Codeine, Darvon [propoxyphene], Demerol [meperidine], Methocarbamol , Penicillins, Sulfa antibiotics, and Iron     Social History:  The patient  reports that she has never smoked. She has never used smokeless tobacco. She reports that she does not drink alcohol and does not use drugs.   Family History:  The patient's family history includes Breast  cancer (age of onset: 62) in her sister; Breast cancer (age of onset: 65) in her mother; Cancer in her mother; Colon cancer in her brother, brother, and father; Stroke in her sister.    ROS:  Please see the history of present illness.   Otherwise, review of systems are positive for none.   All other systems are reviewed and negative.    PHYSICAL EXAM: VS:  BP (!) 160/60 (BP Location: Right Arm, Patient Position: Sitting, Cuff Size: Normal)   Pulse 73   Ht 5' 7 (1.702 m)   Wt 137 lb 2 oz (62.2 kg)   SpO2 95%   BMI 21.48 kg/m  , BMI Body mass index is 21.48 kg/m. GEN: Well nourished, well developed, in no acute distress  HEENT: normal  Neck: no JVD, carotid bruits, or masses Cardiac: RRR; no rubs, or gallops, mild bilateral leg edema . 3/6 crescendo decrescendo systolic murmur in the aortic area which is mid peaking Respiratory:  clear to auscultation bilaterally, normal work of breathing GI: soft, nontender, nondistended, + BS MS: no deformity or atrophy  Skin: warm and dry, no rash Neuro:  Strength and sensation are intact Psych: euthymic mood, full affect    EKG:  EKG is ordered today. The ekg ordered today demonstrates : Normal sinus rhythm Left ventricular hypertrophy with repolarization abnormality ( R in aVL ) Cannot rule out Inferior infarct , age undetermined    Recent Labs: 12/19/2023: ALT 20; BUN 29; Creatinine, Ser 1.19; Hemoglobin 10.3; Platelets 164.0; Potassium 4.2; Pro B Natriuretic peptide (BNP) 278.0; Sodium 138; TSH 5.12     Lipid Panel    Component Value Date/Time   CHOL 125 04/19/2023 1159   TRIG 185 (H) 04/19/2023 1159   HDL 38 (L) 04/19/2023 1159   CHOLHDL 3.3 04/19/2023 1159   CHOLHDL 4 04/19/2021 0951   VLDL 49.0 (H) 04/19/2021 0951   LDLCALC 56 04/19/2023 1159   LDLDIRECT 72.0 04/19/2021 0951      Wt Readings from Last 3 Encounters:  01/23/24 137 lb 2 oz (62.2 kg)  10/23/23 135 lb (61.2 kg)  05/09/23 133 lb (60.3 kg)           No data to display            ASSESSMENT AND PLAN:  1.  Coronary artery disease involving native coronary arteries with stable angina: She is doing well at the present time with no anginal symptoms.  Continue medical therapy.  She is on clopidogrel .  2.  Moderate aortic stenosis.  I requested a follow-up echocardiogram to be done in March 2026.  Unlikely to be a candidate for TAVR considering her age.   3.  Essential hypertension: Blood pressure is elevated today but usually it goes down quickly with medications.    4.  Hyperlipidemia: Continue treatment with atorvastatin  40 mg once daily.  Most recent lipid profile showed an LDL of 56.  5.  Chronic diastolic heart failure: She has mild edema affecting the right lower extremity.  She has furosemide  to be used as needed.    Disposition:   FU with me in 6 months.  Signed,  Deatrice Cage, MD  01/23/2024 5:19 PM    [facility]

## 2024-01-29 ENCOUNTER — Other Ambulatory Visit: Payer: Self-pay | Admitting: Internal Medicine

## 2024-02-05 NOTE — Progress Notes (Signed)
 Assessment & Plan:  1. Reactive airways dysfunction syndrome (HCC) (Primary)  2. Vocal cord dysfunction  3. Laryngopharyngeal reflux (LPR)  4. Nonrheumatic aortic valve stenosis   Patient Instructions  Continue the antireflux measures that you are already following.   You may substitute Pepcid for Prevacid .  You may take the Pepcid as needed for reflux.  20 mg tablet, can be found over-the-counter.   We will see you follow-up in 4 months time.  Call sooner if you have any difficulties.  Please note: late entry documentation due to logistical difficulties during COVID-19 pandemic. This note is filed for information purposes only, and is not intended to be used for billing, nor does it represent the full scope/nature of the visit in question. Please see any associated scanned media linked to date of encounter for additional pertinent information.  Subjective:    HPI: Maureen Morales is a 89 y.o. female presenting to the pulmonology clinic on 05/27/2019 with report of: Follow-up (Pt states she has been feeling good. Daughter stated her wheezing has decreased in the morning. Pt denies any SOB, cough, fever, or chills.)     Outpatient Encounter Medications as of 05/27/2019  Medication Sig   loratadine  (CLARITIN ) 10 MG tablet Take 10 mg daily by mouth.    nitroGLYCERIN  (NITROSTAT ) 0.4 MG SL tablet Place 1 tablet (0.4 mg total) under the tongue every 5 (five) minutes as needed for chest pain.   vitamin C  (ASCORBIC ACID ) 500 MG tablet Take 1 tablet (500 mg total) by mouth 2 (two) times daily.   [DISCONTINUED] albuterol  (VENTOLIN  HFA) 108 (90 Base) MCG/ACT inhaler Inhale 2 puffs into the lungs every 6 (six) hours as needed for wheezing or shortness of breath.   [DISCONTINUED] atorvastatin  (LIPITOR) 40 MG tablet TAKE 1 TABLET BY MOUTH DAILY   [DISCONTINUED] carvedilol  (COREG ) 6.25 MG tablet Take 1 tablet (6.25 mg total) by mouth 2 (two) times daily with a meal.   [DISCONTINUED]  diclofenac sodium (VOLTAREN) 1 % GEL Apply topically.   [DISCONTINUED] FLORA-Q (FLORA-Q) CAPS capsule Take 1 capsule by mouth daily.   [DISCONTINUED] glipiZIDE  (GLUCOTROL ) 5 MG tablet Take 1 tablet (5 mg total) by mouth 2 (two) times daily before a meal.   [DISCONTINUED] glucose blood (BAYER CONTOUR TEST) test strip Use as instructed   [DISCONTINUED] hydrALAZINE  (APRESOLINE ) 25 MG tablet Take 2 tablets (50 mg total) by mouth 3 (three) times daily. (Patient taking differently: Take 50 mg by mouth 2 (two) times daily. )   [DISCONTINUED] hydrochlorothiazide  (MICROZIDE ) 12.5 MG capsule TAKE 1 CAPSULE (12.5 MG) BY MOUTH EVERY DAY   [DISCONTINUED] isosorbide  mononitrate (IMDUR ) 60 MG 24 hr tablet TAKE 1 TABLET BY MOUTH DAILY   [DISCONTINUED] lansoprazole  (PREVACID ) 30 MG capsule Take 1 capsule (30 mg total) by mouth daily before supper.   [DISCONTINUED] losartan  (COZAAR ) 100 MG tablet TAKE 1 TABLET BY MOUTH DAILY   [DISCONTINUED] sertraline  (ZOLOFT ) 50 MG tablet TAKE 1 AND 1/2 TABLET EVERY DAY   [DISCONTINUED] Trospium Chloride 60 MG CP24 TAKE ONE CAPSULE EACH MORNING BEFORE BREAKFAST   No facility-administered encounter medications on file as of 05/27/2019.      Objective:   Vitals:   05/27/19 1111  BP: 122/74  Pulse: 69  Temp: (!) 97.1 F (36.2 C)  Height: 5' 1 (1.549 m)  Weight: 144 lb 3.2 oz (65.4 kg)  SpO2: 97% Comment: on ra  TempSrc: Temporal  BMI (Calculated): 27.26     Physical exam documentation is limited by delayed  entry of information.

## 2024-02-05 NOTE — Progress Notes (Signed)
 Assessment & Plan:  1. Reactive airways dysfunction syndrome (HCC) (Primary)  2. Vocal cord dysfunction  3. Laryngopharyngeal reflux (LPR)  4. Nonrheumatic aortic valve stenosis   Patient Instructions  1.  Do give the albuterol  a try I want to see if this takes care of your wheezing.  Try 2 puffs approximately 1-2 hours before going to bed.  2.  Continue taking your antireflux medicine.   3.  We will see you in follow-up in 2 months time call sooner should any new problems arise.  Also call to report and let us  know how the albuterol  is working for you.  Please note: late entry documentation due to logistical difficulties during COVID-19 pandemic. This note is filed for information purposes only, and is not intended to be used for billing, nor does it represent the full scope/nature of the visit in question. Please see any associated scanned media linked to date of encounter for additional pertinent information.  Subjective:    HPI: Maureen Morales is a 89 y.o. female presenting to the pulmonology clinic on 03/19/2019 with report of: Follow-up (pt reports wheezing and non prod cough. )     Outpatient Encounter Medications as of 03/19/2019  Medication Sig   loratadine  (CLARITIN ) 10 MG tablet Take 10 mg daily by mouth.    nitroGLYCERIN  (NITROSTAT ) 0.4 MG SL tablet Place 1 tablet (0.4 mg total) under the tongue every 5 (five) minutes as needed for chest pain.   vitamin C  (ASCORBIC ACID ) 500 MG tablet Take 1 tablet (500 mg total) by mouth 2 (two) times daily.   [DISCONTINUED] albuterol  (VENTOLIN  HFA) 108 (90 Base) MCG/ACT inhaler Inhale 2 puffs into the lungs every 6 (six) hours as needed for wheezing or shortness of breath.   [DISCONTINUED] atorvastatin  (LIPITOR) 40 MG tablet TAKE 1 TABLET BY MOUTH DAILY   [DISCONTINUED] carvedilol  (COREG ) 6.25 MG tablet Take 6.25 mg by mouth 2 (two) times daily with a meal.   [DISCONTINUED] diclofenac sodium (VOLTAREN) 1 % GEL Apply topically.    [DISCONTINUED] FLORA-Q (FLORA-Q) CAPS capsule Take 1 capsule by mouth daily.   [DISCONTINUED] glipiZIDE  (GLUCOTROL ) 5 MG tablet Take 1 tablet (5 mg total) by mouth 2 (two) times daily before a meal.   [DISCONTINUED] glucose blood (BAYER CONTOUR TEST) test strip Use as instructed   [DISCONTINUED] hydrALAZINE  (APRESOLINE ) 25 MG tablet Take 2 tablets (50 mg total) by mouth 3 (three) times daily. (Patient taking differently: Take 50 mg by mouth 2 (two) times daily. )   [DISCONTINUED] hydrochlorothiazide  (MICROZIDE ) 12.5 MG capsule TAKE 1 CAPSULE (12.5 MG) BY MOUTH EVERY DAY   [DISCONTINUED] isosorbide  mononitrate (IMDUR ) 60 MG 24 hr tablet TAKE 1 TABLET BY MOUTH DAILY   [DISCONTINUED] lansoprazole  (PREVACID ) 30 MG capsule Take 1 capsule (30 mg total) by mouth daily before supper.   [DISCONTINUED] losartan  (COZAAR ) 100 MG tablet TAKE 1 TABLET BY MOUTH DAILY   [DISCONTINUED] sertraline  (ZOLOFT ) 50 MG tablet TAKE 1 AND 1/2 TABLET EVERY DAY   [DISCONTINUED] Trospium Chloride 60 MG CP24 TAKE ONE CAPSULE EACH MORNING BEFORE BREAKFAST   No facility-administered encounter medications on file as of 03/19/2019.      Objective:   Vitals:   03/19/19 1137  BP: 122/64  Pulse: 72  Temp: 97.9 F (36.6 C)  Height: 5' 1 (1.549 m)  Weight: 142 lb (64.4 kg)  SpO2: 96%  TempSrc: Temporal  BMI (Calculated): 26.84     Physical exam documentation is limited by delayed entry of information.

## 2024-02-12 DIAGNOSIS — Z23 Encounter for immunization: Secondary | ICD-10-CM | POA: Diagnosis not present

## 2024-02-14 ENCOUNTER — Other Ambulatory Visit: Payer: Self-pay | Admitting: Internal Medicine

## 2024-02-20 ENCOUNTER — Other Ambulatory Visit: Payer: Self-pay | Admitting: Internal Medicine

## 2024-02-25 ENCOUNTER — Other Ambulatory Visit: Payer: Self-pay | Admitting: Internal Medicine

## 2024-02-27 ENCOUNTER — Other Ambulatory Visit: Payer: Self-pay | Admitting: Cardiovascular Disease

## 2024-03-14 ENCOUNTER — Other Ambulatory Visit: Payer: Self-pay | Admitting: Internal Medicine

## 2024-03-15 ENCOUNTER — Other Ambulatory Visit: Payer: Self-pay | Admitting: Internal Medicine

## 2024-03-19 NOTE — Telephone Encounter (Signed)
 Patient daughter notified of Dr Marylynn, MD recommendations. [name] verbalized understanding and has no further questions at this time.

## 2024-04-03 ENCOUNTER — Telehealth: Payer: Self-pay | Admitting: Internal Medicine

## 2024-04-03 ENCOUNTER — Other Ambulatory Visit: Payer: Self-pay | Admitting: Internal Medicine

## 2024-04-03 MED ORDER — AZITHROMYCIN 250 MG PO TABS: ORAL_TABLET | ORAL | 0 refills | Status: AC

## 2024-04-03 MED ORDER — PREDNISONE 10 MG PO TABS: ORAL_TABLET | ORAL | 0 refills | Status: AC

## 2024-04-03 NOTE — Telephone Encounter (Signed)
 Patient has developed a coguh,  body aches and per caregiver is wheezing and has ronchi.  I have called in a prednisone  taper and azithromycin  for patient to take for the next 5-6 days

## 2024-04-03 NOTE — Telephone Encounter (Signed)
 Pt and daughter is aware of the medications that have been sent in.

## 2024-04-05 ENCOUNTER — Other Ambulatory Visit: Payer: Self-pay | Admitting: Internal Medicine

## 2024-04-05 MED ORDER — BUDESONIDE 0.25 MG/2ML IN SUSP: 0.2500 mg | Freq: Three times a day (TID) | RESPIRATORY_TRACT | 12 refills | Status: AC | PRN

## 2024-04-05 MED ORDER — ALBUTEROL SULFATE (2.5 MG/3ML) 0.083% IN NEBU: 2.5000 mg | INHALATION_SOLUTION | Freq: Four times a day (QID) | RESPIRATORY_TRACT | 12 refills | Status: AC | PRN

## 2024-04-16 ENCOUNTER — Other Ambulatory Visit: Payer: Self-pay | Admitting: Internal Medicine

## 2024-04-18 ENCOUNTER — Other Ambulatory Visit: Payer: Self-pay | Admitting: Internal Medicine

## 2024-04-19 ENCOUNTER — Other Ambulatory Visit: Payer: Self-pay | Admitting: Cardiovascular Disease

## 2024-04-21 NOTE — Telephone Encounter (Signed)
 Refilled: 12/28/2023 Glipizide     10/24/2023  Sertraline  Last OV: 05/09/2023 Next OV: not scheduled

## 2024-05-14 ENCOUNTER — Other Ambulatory Visit: Payer: Self-pay | Admitting: Cardiovascular Disease

## 2024-05-15 NOTE — Telephone Encounter (Signed)
 Labs out of Range  In accordance with refill protocols, please review and address the following requirements before this medication refill can be authorized:  Labs

## 2024-05-21 ENCOUNTER — Other Ambulatory Visit: Payer: Self-pay | Admitting: Internal Medicine

## 2024-05-21 DIAGNOSIS — E1121 Type 2 diabetes mellitus with diabetic nephropathy: Secondary | ICD-10-CM

## 2024-05-22 NOTE — Telephone Encounter (Signed)
 Scheduled pt for lab appt on 05/23/24

## 2024-05-23 ENCOUNTER — Other Ambulatory Visit

## 2024-05-23 DIAGNOSIS — E1121 Type 2 diabetes mellitus with diabetic nephropathy: Secondary | ICD-10-CM | POA: Diagnosis not present

## 2024-05-23 LAB — COMPREHENSIVE METABOLIC PANEL WITH GFR
ALT: 16 U/L (ref 3–35)
AST: 15 U/L (ref 5–37)
Albumin: 4.1 g/dL (ref 3.5–5.2)
Alkaline Phosphatase: 89 U/L (ref 39–117)
BUN: 32 mg/dL — ABNORMAL HIGH (ref 6–23)
CO2: 25 meq/L (ref 19–32)
Calcium: 9 mg/dL (ref 8.4–10.5)
Chloride: 105 meq/L (ref 96–112)
Creatinine, Ser: 1.21 mg/dL — ABNORMAL HIGH (ref 0.40–1.20)
GFR: 37.12 mL/min — ABNORMAL LOW
Glucose, Bld: 197 mg/dL — ABNORMAL HIGH (ref 70–99)
Potassium: 4.3 meq/L (ref 3.5–5.1)
Sodium: 136 meq/L (ref 135–145)
Total Bilirubin: 0.4 mg/dL (ref 0.2–1.2)
Total Protein: 6.2 g/dL (ref 6.0–8.3)

## 2024-05-23 LAB — HEMOGLOBIN A1C: Hgb A1c MFr Bld: 7 % — ABNORMAL HIGH (ref 4.6–6.5)

## 2024-05-25 ENCOUNTER — Ambulatory Visit: Payer: Self-pay | Admitting: Internal Medicine

## 2024-05-25 DIAGNOSIS — D631 Anemia in chronic kidney disease: Secondary | ICD-10-CM

## 2024-05-30 ENCOUNTER — Other Ambulatory Visit: Payer: Self-pay | Admitting: Internal Medicine

## 2024-05-30 DIAGNOSIS — E1121 Type 2 diabetes mellitus with diabetic nephropathy: Secondary | ICD-10-CM
